# Patient Record
Sex: Female | Born: 1945 | Race: Black or African American | Hispanic: No | Marital: Married | Smoking: Never smoker
Health system: Southern US, Community
[De-identification: ages and names within clinical notes are randomized; demographics above are authoritative.]

## PROBLEM LIST (undated history)

## (undated) DIAGNOSIS — E039 Hypothyroidism, unspecified: Secondary | ICD-10-CM

## (undated) DIAGNOSIS — E1129 Type 2 diabetes mellitus with other diabetic kidney complication: Secondary | ICD-10-CM

## (undated) DIAGNOSIS — E559 Vitamin D deficiency, unspecified: Secondary | ICD-10-CM

## (undated) DIAGNOSIS — M109 Gout, unspecified: Secondary | ICD-10-CM

## (undated) DIAGNOSIS — T7840XA Allergy, unspecified, initial encounter: Secondary | ICD-10-CM

## (undated) DIAGNOSIS — I10 Essential (primary) hypertension: Secondary | ICD-10-CM

## (undated) DIAGNOSIS — F419 Anxiety disorder, unspecified: Secondary | ICD-10-CM

## (undated) DIAGNOSIS — L80 Vitiligo: Secondary | ICD-10-CM

## (undated) DIAGNOSIS — J309 Allergic rhinitis, unspecified: Secondary | ICD-10-CM

## (undated) DIAGNOSIS — G4733 Obstructive sleep apnea (adult) (pediatric): Secondary | ICD-10-CM

## (undated) DIAGNOSIS — E669 Obesity, unspecified: Secondary | ICD-10-CM

## (undated) DIAGNOSIS — E785 Hyperlipidemia, unspecified: Secondary | ICD-10-CM

## (undated) HISTORY — DX: Hyperlipidemia, unspecified: E78.5

## (undated) HISTORY — DX: Allergy, unspecified, initial encounter: T78.40XA

## (undated) HISTORY — DX: Obesity, unspecified: E66.9

## (undated) HISTORY — DX: Vitiligo: L80

## (undated) HISTORY — PX: TUBAL LIGATION: SHX77

## (undated) HISTORY — PX: DENTAL EXAMINATION UNDER ANESTHESIA: SHX1447

## (undated) HISTORY — DX: Vitamin D deficiency, unspecified: E55.9

## (undated) HISTORY — DX: Hypothyroidism, unspecified: E03.9

## (undated) HISTORY — DX: Obstructive sleep apnea (adult) (pediatric): G47.33

## (undated) HISTORY — DX: Gout, unspecified: M10.9

## (undated) HISTORY — DX: Allergic rhinitis, unspecified: J30.9

## (undated) HISTORY — DX: Anxiety disorder, unspecified: F41.9

## (undated) HISTORY — DX: Type 2 diabetes mellitus with other diabetic kidney complication: E11.29

## (undated) HISTORY — PX: OTHER SURGICAL HISTORY: SHX169

## (undated) HISTORY — DX: Essential (primary) hypertension: I10

---

## 1998-09-20 ENCOUNTER — Other Ambulatory Visit: Admission: RE | Admit: 1998-09-20 | Discharge: 1998-09-20 | Payer: Self-pay | Admitting: *Deleted

## 1998-11-14 ENCOUNTER — Ambulatory Visit (HOSPITAL_COMMUNITY): Admission: RE | Admit: 1998-11-14 | Discharge: 1998-11-14 | Payer: Self-pay | Admitting: *Deleted

## 1999-10-10 ENCOUNTER — Other Ambulatory Visit: Admission: RE | Admit: 1999-10-10 | Discharge: 1999-10-10 | Payer: Self-pay | Admitting: *Deleted

## 1999-12-13 ENCOUNTER — Ambulatory Visit (HOSPITAL_COMMUNITY): Admission: RE | Admit: 1999-12-13 | Discharge: 1999-12-13 | Payer: Self-pay | Admitting: *Deleted

## 2000-11-02 ENCOUNTER — Other Ambulatory Visit: Admission: RE | Admit: 2000-11-02 | Discharge: 2000-11-02 | Payer: Self-pay | Admitting: *Deleted

## 2001-01-04 ENCOUNTER — Ambulatory Visit (HOSPITAL_COMMUNITY): Admission: RE | Admit: 2001-01-04 | Discharge: 2001-01-04 | Payer: Self-pay | Admitting: *Deleted

## 2001-11-23 ENCOUNTER — Other Ambulatory Visit: Admission: RE | Admit: 2001-11-23 | Discharge: 2001-11-23 | Payer: Self-pay | Admitting: Internal Medicine

## 2002-01-20 ENCOUNTER — Encounter: Payer: Self-pay | Admitting: Internal Medicine

## 2002-01-20 ENCOUNTER — Ambulatory Visit (HOSPITAL_COMMUNITY): Admission: RE | Admit: 2002-01-20 | Discharge: 2002-01-20 | Payer: Self-pay | Admitting: Internal Medicine

## 2003-01-26 ENCOUNTER — Ambulatory Visit (HOSPITAL_COMMUNITY): Admission: RE | Admit: 2003-01-26 | Discharge: 2003-01-26 | Payer: Self-pay | Admitting: Internal Medicine

## 2003-01-26 ENCOUNTER — Encounter: Payer: Self-pay | Admitting: Internal Medicine

## 2003-03-02 ENCOUNTER — Ambulatory Visit (HOSPITAL_COMMUNITY): Admission: RE | Admit: 2003-03-02 | Discharge: 2003-03-02 | Payer: Self-pay | Admitting: Internal Medicine

## 2003-03-02 ENCOUNTER — Encounter: Payer: Self-pay | Admitting: Internal Medicine

## 2003-03-09 ENCOUNTER — Ambulatory Visit (HOSPITAL_COMMUNITY): Admission: RE | Admit: 2003-03-09 | Discharge: 2003-03-09 | Payer: Self-pay | Admitting: Internal Medicine

## 2003-03-09 ENCOUNTER — Encounter: Payer: Self-pay | Admitting: Internal Medicine

## 2003-04-13 ENCOUNTER — Emergency Department (HOSPITAL_COMMUNITY): Admission: EM | Admit: 2003-04-13 | Discharge: 2003-04-13 | Payer: Self-pay | Admitting: Emergency Medicine

## 2003-06-19 ENCOUNTER — Ambulatory Visit (HOSPITAL_COMMUNITY): Admission: RE | Admit: 2003-06-19 | Discharge: 2003-06-19 | Payer: Self-pay | Admitting: Internal Medicine

## 2003-06-19 ENCOUNTER — Encounter: Payer: Self-pay | Admitting: Internal Medicine

## 2003-11-30 ENCOUNTER — Other Ambulatory Visit: Admission: RE | Admit: 2003-11-30 | Discharge: 2003-11-30 | Payer: Self-pay | Admitting: Internal Medicine

## 2004-12-10 ENCOUNTER — Ambulatory Visit (HOSPITAL_COMMUNITY): Admission: RE | Admit: 2004-12-10 | Discharge: 2004-12-10 | Payer: Self-pay | Admitting: Internal Medicine

## 2005-12-31 ENCOUNTER — Ambulatory Visit (HOSPITAL_COMMUNITY): Admission: RE | Admit: 2005-12-31 | Discharge: 2005-12-31 | Payer: Self-pay | Admitting: Internal Medicine

## 2005-12-31 ENCOUNTER — Other Ambulatory Visit: Admission: RE | Admit: 2005-12-31 | Discharge: 2005-12-31 | Payer: Self-pay | Admitting: Internal Medicine

## 2006-10-12 ENCOUNTER — Encounter: Admission: RE | Admit: 2006-10-12 | Discharge: 2007-01-10 | Payer: Self-pay | Admitting: Internal Medicine

## 2006-11-18 ENCOUNTER — Emergency Department (HOSPITAL_COMMUNITY): Admission: EM | Admit: 2006-11-18 | Discharge: 2006-11-18 | Payer: Self-pay | Admitting: Family Medicine

## 2007-01-28 ENCOUNTER — Ambulatory Visit (HOSPITAL_COMMUNITY): Admission: RE | Admit: 2007-01-28 | Discharge: 2007-01-28 | Payer: Self-pay | Admitting: Obstetrics and Gynecology

## 2008-01-28 ENCOUNTER — Other Ambulatory Visit: Admission: RE | Admit: 2008-01-28 | Discharge: 2008-01-28 | Payer: Self-pay | Admitting: Internal Medicine

## 2008-02-09 ENCOUNTER — Ambulatory Visit (HOSPITAL_COMMUNITY): Admission: RE | Admit: 2008-02-09 | Discharge: 2008-02-09 | Payer: Self-pay | Admitting: Internal Medicine

## 2008-10-09 ENCOUNTER — Emergency Department (HOSPITAL_COMMUNITY): Admission: EM | Admit: 2008-10-09 | Discharge: 2008-10-09 | Payer: Self-pay | Admitting: Emergency Medicine

## 2008-10-11 ENCOUNTER — Ambulatory Visit (HOSPITAL_COMMUNITY): Admission: RE | Admit: 2008-10-11 | Discharge: 2008-10-11 | Payer: Self-pay | Admitting: Chiropractor

## 2009-03-14 ENCOUNTER — Ambulatory Visit (HOSPITAL_COMMUNITY): Admission: RE | Admit: 2009-03-14 | Discharge: 2009-03-14 | Payer: Self-pay | Admitting: Internal Medicine

## 2010-06-03 ENCOUNTER — Ambulatory Visit (HOSPITAL_COMMUNITY): Admission: RE | Admit: 2010-06-03 | Discharge: 2010-06-03 | Payer: Self-pay | Admitting: Internal Medicine

## 2011-01-07 ENCOUNTER — Encounter: Payer: Self-pay | Admitting: Gastroenterology

## 2011-01-12 ENCOUNTER — Encounter: Payer: Self-pay | Admitting: Internal Medicine

## 2011-01-23 NOTE — Letter (Signed)
Summary: Colonoscopy Date Change Letter  [facility]  [place], [place] [address]   Phone: [phone]  Fax: [phone]      January 07, 2011 MRN: [id number]   Jocelyn Camacho [address] [place], [place]  [address]   Dear [name],   Previously you were recommended to have a repeat colonoscopy around this time. Your chart was recently reviewed by DR.KAPLAN of [facility]. Follow up colonoscopy is now recommended in JAN 2015. This revised recommendation is based on current, nationally recognized guidelines for colorectal cancer screening and polyp surveillance. These guidelines are endorsed by the [place] Task Force on Colorectal Cancer as well as numerous other major medical organizations.  Please understand that our recommendation assumes that you do not have any new symptoms such as bleeding, a change in bowel habits, anemia, or significant abdominal discomfort. If you do have any concerning GI symptoms or want to discuss the guideline recommendations, please call to arrange an office visit at your earliest convenience. Otherwise we will keep you in our reminder system and contact you 1-2 months prior to the date listed above to schedule your next colonoscopy.  Thank you,  Sandy Salaam. Deatra Ina, M.D. [facility] [phone]

## 2011-07-02 ENCOUNTER — Other Ambulatory Visit (HOSPITAL_COMMUNITY): Payer: Self-pay | Admitting: Internal Medicine

## 2011-07-02 ENCOUNTER — Other Ambulatory Visit (HOSPITAL_COMMUNITY)
Admission: RE | Admit: 2011-07-02 | Discharge: 2011-07-02 | Disposition: A | Discharge status: Home / Self Care | Payer: Medicare Other | Source: Ambulatory Visit | Attending: Internal Medicine | Admitting: Internal Medicine

## 2011-07-02 DIAGNOSIS — Z01419 Encounter for gynecological examination (general) (routine) without abnormal findings: Secondary | ICD-10-CM | POA: Insufficient documentation

## 2011-07-02 DIAGNOSIS — Z1231 Encounter for screening mammogram for malignant neoplasm of breast: Secondary | ICD-10-CM

## 2011-07-11 ENCOUNTER — Ambulatory Visit (HOSPITAL_COMMUNITY)
Admission: RE | Admit: 2011-07-11 | Discharge: 2011-07-11 | Disposition: A | Discharge status: Home / Self Care | Payer: Medicare Other | Source: Ambulatory Visit | Attending: Internal Medicine | Admitting: Internal Medicine

## 2011-07-11 DIAGNOSIS — Z1231 Encounter for screening mammogram for malignant neoplasm of breast: Secondary | ICD-10-CM | POA: Insufficient documentation

## 2012-07-12 ENCOUNTER — Other Ambulatory Visit: Payer: Self-pay | Admitting: Internal Medicine

## 2012-07-12 DIAGNOSIS — I10 Essential (primary) hypertension: Secondary | ICD-10-CM

## 2012-07-14 ENCOUNTER — Other Ambulatory Visit (HOSPITAL_COMMUNITY): Payer: Self-pay | Admitting: Internal Medicine

## 2012-07-14 DIAGNOSIS — Z1231 Encounter for screening mammogram for malignant neoplasm of breast: Secondary | ICD-10-CM

## 2012-07-15 ENCOUNTER — Ambulatory Visit
Admission: RE | Admit: 2012-07-15 | Discharge: 2012-07-15 | Disposition: A | Discharge status: Home / Self Care | Payer: Medicare Other | Source: Ambulatory Visit | Attending: Internal Medicine | Admitting: Internal Medicine

## 2012-07-15 DIAGNOSIS — I10 Essential (primary) hypertension: Secondary | ICD-10-CM

## 2012-08-03 ENCOUNTER — Ambulatory Visit (HOSPITAL_COMMUNITY)
Admission: RE | Admit: 2012-08-03 | Discharge: 2012-08-03 | Disposition: A | Discharge status: Home / Self Care | Payer: 59 | Source: Ambulatory Visit | Attending: Internal Medicine | Admitting: Internal Medicine

## 2012-08-03 DIAGNOSIS — Z1231 Encounter for screening mammogram for malignant neoplasm of breast: Secondary | ICD-10-CM | POA: Insufficient documentation

## 2012-08-18 ENCOUNTER — Encounter: Payer: Medicare Other | Attending: Internal Medicine | Admitting: *Deleted

## 2012-08-18 ENCOUNTER — Encounter: Payer: Self-pay | Admitting: *Deleted

## 2012-08-18 DIAGNOSIS — I10 Essential (primary) hypertension: Secondary | ICD-10-CM | POA: Insufficient documentation

## 2012-08-18 DIAGNOSIS — Z713 Dietary counseling and surveillance: Secondary | ICD-10-CM | POA: Insufficient documentation

## 2012-08-18 DIAGNOSIS — E669 Obesity, unspecified: Secondary | ICD-10-CM | POA: Insufficient documentation

## 2012-08-18 NOTE — Progress Notes (Signed)
  Medical Nutrition Therapy:  Appt start time: 0900 end time:  1000.  Assessment:  Primary concerns today: patient here for hypertension and obesity. She retired in 2004 from [employer] and she lives with her husband. She states she has recently been diagnosed with diabetes, she is testing her BG each AM and range is 90-106. She was having symptoms including polydipsia and polyuria but they have subsided since she started on diabetes medications. Has lost weight when on Weight Watchers and with [employer] at [place]. She states life long history of obesity with weight swings of 70 pounds at times.  MEDICATIONS: see list   DIETARY INTAKE:  Usual eating pattern includes 3 meals and 2-3 snacks per day.  Everyday foods include good variety of all food groups.  Avoided foods include biscuits, sweets and most fried foods.    24-hr recall:  B ( AM):flavored instant oatmeal x 2, smoked sausage, water OR sausage McMuffin sandwich OR [employer] bkfst sandwich on [place] toast with egg white and cheese on flat bread OR grits, meat, fruit Snk ( AM): fresh fruit  L ( PM): sandwich OR Wendy's chili and side salad with no dressing, diet soda Snk ( PM): occasionally fresh fruit OR chili OR stew D ( PM): eat out often or pick up food: meat and 2 vegetables, occasionally a starch Snk ( PM): fresh fruit or chips Beverages: water, diet soda  Usual physical activity: history of boot camp, walking jogging, aerobic exercises   Estimated energy needs: 1400 calories 158 g carbohydrates 105 g protein 39 g fat  Progress Towards Goal(s):  In progress.   Nutritional Diagnosis:  NI-1.5 Excessive energy intake As related to activity level.  As evidenced by BMI of 39.5.    Intervention:  Nutrition counseling provided for weight loss. Discussed caloric content of macro-nutrients and rationale of using Carb Counting to increase awareness of current eating habits and value of preferred foods. Also  discussed higher caloric content of fats and recommendation of fat content per meal. Finally discussed her preferred forms of activity and ways to continue to include those on a daily basis. Plan: Aim for 2-3 Carb choices (30-45 grams) per meal, Aim for 0-1 carb per snack if hungry Continue with activity level as tolerated Continue eating 3 meals a day and snacks if hungry Consider 7 Minute Workout APP or perhaps water exercises or Spinning Class  Consider Calorie King APP to provide you with more nutrition info on foods  Handouts given during visit include: Carb Counting and Food Label handouts Meal Plan Card  Diabetes Medication Sheet   Monitoring/Evaluation:  Dietary intake, exercise, reading food labels, and body weight in 5 week(s).

## 2012-08-18 NOTE — Patient Instructions (Addendum)
Plan: Aim for 2-3 Carb choices (30-45 grams) per meal, Aim for 0-1 carb per snack if hungry Continue with activity level as tolerated Continue eating 3 meals a day and snacks if hungry Consider 7 Minute Workout APP or perhaps water exercises Consider Calorie Edison Pace APP to provide you with more nutrition info on foods

## 2012-09-29 ENCOUNTER — Ambulatory Visit: Payer: Medicare Other | Admitting: *Deleted

## 2012-10-13 ENCOUNTER — Encounter: Payer: Medicare Other | Attending: Internal Medicine | Admitting: *Deleted

## 2012-10-13 DIAGNOSIS — Z713 Dietary counseling and surveillance: Secondary | ICD-10-CM | POA: Insufficient documentation

## 2012-10-13 DIAGNOSIS — I10 Essential (primary) hypertension: Secondary | ICD-10-CM | POA: Insufficient documentation

## 2012-10-13 DIAGNOSIS — E669 Obesity, unspecified: Secondary | ICD-10-CM | POA: Insufficient documentation

## 2012-10-13 NOTE — Patient Instructions (Addendum)
Plan: Continue to aim for 3 Carb choices (45 grams) per meal Continue to aim for 0-1 carb per snack if hungry Consider aiming for 15 grams of fat at each meal or less Continue with activity level as tolerated Continue eating 3 meals a day and snacks if hungry Consider 7 Minute Workout APP or perhaps water exercises Consider Calorie Edison Pace APP to provide you with more nutrition info on foods Check into My Fitness Pal APP to track food and exercise easily

## 2012-10-13 NOTE — Progress Notes (Signed)
  Medical Nutrition Therapy:  Appt start time: 0830 end time:  0900.  Assessment:  Primary concerns today: patient here for hypertension and obesity follow up visit. Weight gain of 13 pounds in last 2 months noted. She has had flu last several weeks but when able she is getting a brisk walk in when going to football games and when shopping. She states she is SMBG and her BGs are within target ranges.   MEDICATIONS: see list   DIETARY INTAKE:  Usual eating pattern includes 3 meals and 2-3 snacks per day.  Everyday foods include good variety of all food groups.  Avoided foods include biscuits, sweets and most fried foods.    24-hr recall:  B ( AM) now 1 flavored and 1 plain instant oatmeal mixed, smoked sausage, water OR sausage McMuffin sandwich OR grits, meat, fruit Snk ( AM): fresh fruit if available L ( PM): sandwich OR Wendy's chili and side salad with no dressing, OF Burger with veggies and a couple of fries @ [employer], diet soda Snk ( PM): occasionally fresh fruit OR chili OR stew D ( PM): eat out often or pick up food: meat and 2 vegetables, occasionally a starch Snk ( PM): fresh fruit or chips Beverages: water, diet soda  Usual physical activity: history of boot camp, walking jogging, aerobic exercises. Has had the flu so no exercise for last several weeks except for some Arm Chair exercises and walking in place at home for about 30 minutes.  Estimated energy needs: 1400 calories 158 g carbohydrates 105 g protein 39 g fat  Progress Towards Goal(s):  In progress.   Nutritional Diagnosis:  NI-1.5 Excessive energy intake As related to activity level.  As evidenced by current BMI increase to 41.8.    Intervention:  Reviewed importance of Carb Counting and increasing her activity level to stop weight gain. Commended her on her BG control and recommend she test her BG at alternate times of day to capture more data pre and post meals. Discussed calorie content of fat and  recommend she aim for no more than 3 fat servings (15 grams) per meal, which will also assist with weight management.  Plan: Continue to aim for 3 Carb choices (45 grams) per meal Continue to aim for 0-1 carb per snack if hungry Consider aiming for 15 grams of fat at each meal or less Continue with activity level as tolerated Continue eating 3 meals a day and snacks if hungry Consider 7 Minute Workout APP or perhaps water exercises Consider Calorie Edison Pace APP to provide you with more nutrition info on foods Check into My Fitness Pal APP to track food and exercise easily  Monitoring/Evaluation:  Dietary intake, exercise, reading food labels, and body weight in 2 months.

## 2012-10-21 ENCOUNTER — Encounter: Payer: Self-pay | Admitting: *Deleted

## 2012-12-13 ENCOUNTER — Ambulatory Visit: Payer: Medicare Other | Admitting: *Deleted

## 2013-06-10 ENCOUNTER — Encounter (INDEPENDENT_AMBULATORY_CARE_PROVIDER_SITE_OTHER): Payer: Medicare Other | Admitting: Ophthalmology

## 2013-06-10 DIAGNOSIS — I10 Essential (primary) hypertension: Secondary | ICD-10-CM

## 2013-06-10 DIAGNOSIS — E1165 Type 2 diabetes mellitus with hyperglycemia: Secondary | ICD-10-CM

## 2013-06-10 DIAGNOSIS — E11319 Type 2 diabetes mellitus with unspecified diabetic retinopathy without macular edema: Secondary | ICD-10-CM

## 2013-06-10 DIAGNOSIS — H348192 Central retinal vein occlusion, unspecified eye, stable: Secondary | ICD-10-CM

## 2013-06-10 DIAGNOSIS — H35039 Hypertensive retinopathy, unspecified eye: Secondary | ICD-10-CM

## 2013-07-08 ENCOUNTER — Encounter (INDEPENDENT_AMBULATORY_CARE_PROVIDER_SITE_OTHER): Payer: Medicare Other | Admitting: Ophthalmology

## 2013-07-08 DIAGNOSIS — E11319 Type 2 diabetes mellitus with unspecified diabetic retinopathy without macular edema: Secondary | ICD-10-CM

## 2013-07-08 DIAGNOSIS — I10 Essential (primary) hypertension: Secondary | ICD-10-CM

## 2013-07-08 DIAGNOSIS — E1139 Type 2 diabetes mellitus with other diabetic ophthalmic complication: Secondary | ICD-10-CM

## 2013-07-08 DIAGNOSIS — H43819 Vitreous degeneration, unspecified eye: Secondary | ICD-10-CM

## 2013-07-08 DIAGNOSIS — H35039 Hypertensive retinopathy, unspecified eye: Secondary | ICD-10-CM

## 2013-07-08 DIAGNOSIS — H348192 Central retinal vein occlusion, unspecified eye, stable: Secondary | ICD-10-CM

## 2013-08-05 ENCOUNTER — Encounter (INDEPENDENT_AMBULATORY_CARE_PROVIDER_SITE_OTHER): Payer: Medicare Other | Admitting: Ophthalmology

## 2013-08-05 DIAGNOSIS — H348192 Central retinal vein occlusion, unspecified eye, stable: Secondary | ICD-10-CM

## 2013-08-05 DIAGNOSIS — E1165 Type 2 diabetes mellitus with hyperglycemia: Secondary | ICD-10-CM

## 2013-08-05 DIAGNOSIS — H35039 Hypertensive retinopathy, unspecified eye: Secondary | ICD-10-CM

## 2013-08-05 DIAGNOSIS — I10 Essential (primary) hypertension: Secondary | ICD-10-CM

## 2013-08-05 DIAGNOSIS — E11319 Type 2 diabetes mellitus with unspecified diabetic retinopathy without macular edema: Secondary | ICD-10-CM

## 2013-08-05 DIAGNOSIS — H2510 Age-related nuclear cataract, unspecified eye: Secondary | ICD-10-CM

## 2013-08-05 DIAGNOSIS — H43819 Vitreous degeneration, unspecified eye: Secondary | ICD-10-CM

## 2013-08-29 ENCOUNTER — Encounter (HOSPITAL_COMMUNITY): Payer: Self-pay | Admitting: *Deleted

## 2013-08-29 ENCOUNTER — Emergency Department (HOSPITAL_COMMUNITY)
Admission: EM | Admit: 2013-08-29 | Discharge: 2013-08-29 | Disposition: A | Discharge status: Home / Self Care | Payer: Medicare Other | Attending: Emergency Medicine | Admitting: Emergency Medicine

## 2013-08-29 DIAGNOSIS — E079 Disorder of thyroid, unspecified: Secondary | ICD-10-CM | POA: Insufficient documentation

## 2013-08-29 DIAGNOSIS — Y929 Unspecified place or not applicable: Secondary | ICD-10-CM | POA: Insufficient documentation

## 2013-08-29 DIAGNOSIS — L0390 Cellulitis, unspecified: Secondary | ICD-10-CM

## 2013-08-29 DIAGNOSIS — I10 Essential (primary) hypertension: Secondary | ICD-10-CM | POA: Insufficient documentation

## 2013-08-29 DIAGNOSIS — Z880 Allergy status to penicillin: Secondary | ICD-10-CM | POA: Insufficient documentation

## 2013-08-29 DIAGNOSIS — E119 Type 2 diabetes mellitus without complications: Secondary | ICD-10-CM | POA: Insufficient documentation

## 2013-08-29 DIAGNOSIS — S81009A Unspecified open wound, unspecified knee, initial encounter: Secondary | ICD-10-CM | POA: Insufficient documentation

## 2013-08-29 DIAGNOSIS — L02419 Cutaneous abscess of limb, unspecified: Secondary | ICD-10-CM | POA: Insufficient documentation

## 2013-08-29 DIAGNOSIS — W268XXA Contact with other sharp object(s), not elsewhere classified, initial encounter: Secondary | ICD-10-CM | POA: Insufficient documentation

## 2013-08-29 DIAGNOSIS — Y9389 Activity, other specified: Secondary | ICD-10-CM | POA: Insufficient documentation

## 2013-08-29 DIAGNOSIS — Z79899 Other long term (current) drug therapy: Secondary | ICD-10-CM | POA: Insufficient documentation

## 2013-08-29 NOTE — ED Provider Notes (Signed)
CSN: [id number]     Arrival date & time 08/29/13  [id number] History   First MD Initiated Contact with Patient 08/29/13 [phone]     Chief Complaint  Patient presents with  . Leg infection     Right    HPI  She was seen by her physician on Friday. She scratch her leg on a piece of furniture indoors on Tuesday. She scratched "some of the skin off". By Friday was read. She had a routine appointment with her physician. She was placed on doxycycline. She admits today that she wasn't sure if her physician who told her to come today no matter what, or only if she was having difficulties. She states she feels like it looks and feels better but."had better get it checked".  No fevers. No pain up the leg. No red streaks.  Her last blood sugar was yesterday and was 88. Past Medical History  Diagnosis Date  . Hypertension   . Diabetes mellitus   . Thyroid disease    Past Surgical History  Procedure Laterality Date  . Abdominal hysterectomy     History reviewed. No pertinent family history. History  Substance Use Topics  . Smoking status: Never Smoker   . Smokeless tobacco: Never Used  . Alcohol Use: No   OB History   Grav Para Term Preterm Abortions TAB SAB Ect Mult Living                 Review of Systems  Constitutional: Negative for fever and fatigue.  Respiratory: Negative for shortness of breath.   Cardiovascular: Negative for chest pain.  Gastrointestinal: Negative for abdominal pain.  Musculoskeletal: Negative for joint swelling and arthralgias.  Skin: Positive for rash and wound.    Allergies  Penicillins  Home Medications   Current Outpatient Rx  Name  Route  Sig  Dispense  Refill  . atenolol (TENORMIN) 100 MG tablet   Oral   Take 50 mg by mouth daily.         Marland Kitchen b complex vitamins tablet   Oral   Take 1 tablet by mouth daily.         . calcium carbonate (OS-CAL) 600 MG TABS   Oral   Take 600 mg by mouth 2 (two) times daily with a meal.         . Cholecalciferol  (VITAMIN D3) 2000 UNITS TABS   Oral   Take 2,000 mcg by mouth 3 (three) times daily.         . Flaxseed, Linseed, (FLAXSEED OIL PO)   Oral   Take by mouth 2 (two) times daily.         . furosemide (LASIX) 40 MG tablet   Oral   Take 40 mg by mouth daily.         Marland Kitchen glyBURIDE (DIABETA) 5 MG tablet   Oral   Take 2.5 mg by mouth 3 (three) times daily.         Marland Kitchen levothyroxine (SYNTHROID, LEVOTHROID) 200 MCG tablet   Oral   Take 200 mcg by mouth daily.         Marland Kitchen losartan (COZAAR) 100 MG tablet   Oral   Take 100 mg by mouth daily.         . metFORMIN (GLUCOPHAGE) 500 MG tablet   Oral   Take 1,000 mg by mouth 2 (two) times daily with a meal.         . Multiple Vitamin (MULTIVITAMIN) tablet  Oral   Take 1 tablet by mouth daily.         . naproxen (NAPROSYN) 250 MG tablet   Oral   Take 250 mg by mouth as needed.         Marland Kitchen olmesartan (BENICAR) 40 MG tablet   Oral   Take 40 mg by mouth daily.         . potassium chloride (K-DUR) 10 MEQ tablet   Oral   Take 10 mEq by mouth daily.          BP 118/77  Pulse 73  Temp(Src) 98.3 F (36.8 C) (Oral)  Resp 16  Ht 5\' 3"  (1.6 m)  Wt 233 lb (105.688 kg)  BMI 41.28 kg/m2  SpO2 95% Physical Exam  Constitutional:  Awake alert, she does not appear toxic  Cardiovascular:  Regular rhythm not tachycardic  Pulmonary/Chest:  Clear lungs  Abdominal:  Abdomen soft benign  Musculoskeletal:  No increase in circumference of the right lower leg versus the left  Skin:  She has a wound to the right lower leg. Approximately 8 x 10 cm. There is a ruptured bulla. She has good pink granulation tissue underlying this. There is minimal surrounding erythema. There is no proximal spread or striations.    ED Course  Procedures (including critical care time) Labs Review Labs Reviewed - No data to display Imaging Review No results found.  MDM   1. Cellulitis    who was cleaned with soap and water. The stridor then  redressed with a nonadherent Vaseline gauze and a Tegaderm. Hopefully additional therapeutics and diagnostics indicated at this time she is doing well on the doxycycline and given her by her primary care physician. At any point this looks or feels worse in bastard recheck with primary care or here. Diagnosis is wound cellulitis-healing    Lolita Patella, MD 08/29/13 [phone]

## 2013-08-29 NOTE — ED Notes (Signed)
Pt states about a week ago she scrapped her lower R leg on furniture, went to PCP this past Friday for physical, showed MD her leg and was placed on an antibiotic, was told to come here Monday to see if she needed IV antibiotics.

## 2013-09-01 ENCOUNTER — Encounter (INDEPENDENT_AMBULATORY_CARE_PROVIDER_SITE_OTHER): Payer: Medicare Other | Admitting: Ophthalmology

## 2013-09-01 DIAGNOSIS — I10 Essential (primary) hypertension: Secondary | ICD-10-CM

## 2013-09-01 DIAGNOSIS — E1139 Type 2 diabetes mellitus with other diabetic ophthalmic complication: Secondary | ICD-10-CM

## 2013-09-01 DIAGNOSIS — H348192 Central retinal vein occlusion, unspecified eye, stable: Secondary | ICD-10-CM

## 2013-09-01 DIAGNOSIS — H35039 Hypertensive retinopathy, unspecified eye: Secondary | ICD-10-CM

## 2013-09-01 DIAGNOSIS — H43819 Vitreous degeneration, unspecified eye: Secondary | ICD-10-CM

## 2013-09-01 DIAGNOSIS — E11319 Type 2 diabetes mellitus with unspecified diabetic retinopathy without macular edema: Secondary | ICD-10-CM

## 2013-09-01 DIAGNOSIS — H2510 Age-related nuclear cataract, unspecified eye: Secondary | ICD-10-CM

## 2013-09-13 ENCOUNTER — Other Ambulatory Visit (HOSPITAL_COMMUNITY): Payer: Self-pay | Admitting: Internal Medicine

## 2013-09-13 DIAGNOSIS — Z1231 Encounter for screening mammogram for malignant neoplasm of breast: Secondary | ICD-10-CM

## 2013-09-21 ENCOUNTER — Ambulatory Visit (HOSPITAL_COMMUNITY): Payer: Medicare Other

## 2013-09-30 ENCOUNTER — Encounter (INDEPENDENT_AMBULATORY_CARE_PROVIDER_SITE_OTHER): Payer: Medicare Other | Admitting: Ophthalmology

## 2013-09-30 DIAGNOSIS — I10 Essential (primary) hypertension: Secondary | ICD-10-CM

## 2013-09-30 DIAGNOSIS — H348192 Central retinal vein occlusion, unspecified eye, stable: Secondary | ICD-10-CM

## 2013-09-30 DIAGNOSIS — H2510 Age-related nuclear cataract, unspecified eye: Secondary | ICD-10-CM

## 2013-09-30 DIAGNOSIS — H35039 Hypertensive retinopathy, unspecified eye: Secondary | ICD-10-CM

## 2013-09-30 DIAGNOSIS — H43819 Vitreous degeneration, unspecified eye: Secondary | ICD-10-CM

## 2013-10-25 ENCOUNTER — Ambulatory Visit (HOSPITAL_COMMUNITY)
Admission: RE | Admit: 2013-10-25 | Discharge: 2013-10-25 | Disposition: A | Discharge status: Home / Self Care | Payer: Medicare Other | Source: Ambulatory Visit | Attending: Internal Medicine | Admitting: Internal Medicine

## 2013-10-25 DIAGNOSIS — Z1231 Encounter for screening mammogram for malignant neoplasm of breast: Secondary | ICD-10-CM | POA: Insufficient documentation

## 2013-11-07 ENCOUNTER — Encounter: Payer: Self-pay | Admitting: Gastroenterology

## 2013-11-11 ENCOUNTER — Encounter (INDEPENDENT_AMBULATORY_CARE_PROVIDER_SITE_OTHER): Payer: Medicare Other | Admitting: Ophthalmology

## 2013-11-11 DIAGNOSIS — I10 Essential (primary) hypertension: Secondary | ICD-10-CM

## 2013-11-11 DIAGNOSIS — H348192 Central retinal vein occlusion, unspecified eye, stable: Secondary | ICD-10-CM

## 2013-11-11 DIAGNOSIS — H35039 Hypertensive retinopathy, unspecified eye: Secondary | ICD-10-CM

## 2013-11-11 DIAGNOSIS — H43819 Vitreous degeneration, unspecified eye: Secondary | ICD-10-CM

## 2013-11-11 DIAGNOSIS — H2510 Age-related nuclear cataract, unspecified eye: Secondary | ICD-10-CM

## 2013-11-14 ENCOUNTER — Other Ambulatory Visit: Payer: Self-pay | Admitting: Internal Medicine

## 2013-11-14 MED ORDER — POTASSIUM CHLORIDE ER 10 MEQ PO TBCR: 10.0000 meq | EXTENDED_RELEASE_TABLET | Freq: Every day | ORAL | Status: DC

## 2013-11-15 ENCOUNTER — Encounter: Payer: Self-pay | Admitting: Internal Medicine

## 2013-11-16 ENCOUNTER — Ambulatory Visit: Payer: Medicare Other | Admitting: Internal Medicine

## 2013-11-16 ENCOUNTER — Telehealth: Payer: Self-pay | Admitting: Internal Medicine

## 2013-11-16 ENCOUNTER — Encounter: Payer: Self-pay | Admitting: Internal Medicine

## 2013-11-16 VITALS — BP 130/82 | HR 66 | Temp 97.9°F | Resp 16 | Wt 241.8 lb

## 2013-11-16 DIAGNOSIS — I10 Essential (primary) hypertension: Secondary | ICD-10-CM

## 2013-11-16 DIAGNOSIS — G4733 Obstructive sleep apnea (adult) (pediatric): Secondary | ICD-10-CM | POA: Insufficient documentation

## 2013-11-16 DIAGNOSIS — E1122 Type 2 diabetes mellitus with diabetic chronic kidney disease: Secondary | ICD-10-CM | POA: Insufficient documentation

## 2013-11-16 DIAGNOSIS — E119 Type 2 diabetes mellitus without complications: Secondary | ICD-10-CM

## 2013-11-16 DIAGNOSIS — E559 Vitamin D deficiency, unspecified: Secondary | ICD-10-CM | POA: Insufficient documentation

## 2013-11-16 DIAGNOSIS — Z794 Long term (current) use of insulin: Secondary | ICD-10-CM | POA: Insufficient documentation

## 2013-11-16 DIAGNOSIS — Z79899 Other long term (current) drug therapy: Secondary | ICD-10-CM

## 2013-11-16 DIAGNOSIS — E039 Hypothyroidism, unspecified: Secondary | ICD-10-CM

## 2013-11-16 DIAGNOSIS — E782 Mixed hyperlipidemia: Secondary | ICD-10-CM

## 2013-11-16 LAB — CBC WITH DIFFERENTIAL/PLATELET
Eosinophils Relative: 2 % (ref 0–5)
HCT: 35.5 % — ABNORMAL LOW (ref 36.0–46.0)
Hemoglobin: 12.2 g/dL (ref 12.0–15.0)
Lymphocytes Relative: 32 % (ref 12–46)
Lymphs Abs: 2.7 10*3/uL (ref 0.7–4.0)
MCH: 27 pg (ref 26.0–34.0)
MCV: 78.5 fL (ref 78.0–100.0)
Monocytes Absolute: 0.5 10*3/uL (ref 0.1–1.0)
Monocytes Relative: 6 % (ref 3–12)
Neutro Abs: 5.1 10*3/uL (ref 1.7–7.7)
RBC: 4.52 MIL/uL (ref 3.87–5.11)
RDW: 14.7 % (ref 11.5–15.5)
WBC: 8.4 10*3/uL (ref 4.0–10.5)

## 2013-11-16 LAB — BASIC METABOLIC PANEL WITH GFR
BUN: 16 mg/dL (ref 6–23)
CO2: 29 mEq/L (ref 19–32)
Chloride: 103 mEq/L (ref 96–112)
Creat: 0.79 mg/dL (ref 0.50–1.10)
GFR, Est Non African American: 78 mL/min
Glucose, Bld: 98 mg/dL (ref 70–99)
Potassium: 4.3 mEq/L (ref 3.5–5.3)

## 2013-11-16 LAB — HEPATIC FUNCTION PANEL
AST: 18 U/L (ref 0–37)
Albumin: 4.4 g/dL (ref 3.5–5.2)
Alkaline Phosphatase: 81 U/L (ref 39–117)
Bilirubin, Direct: 0.2 mg/dL (ref 0.0–0.3)
Indirect Bilirubin: 0.4 mg/dL (ref 0.0–0.9)
Total Protein: 7.5 g/dL (ref 6.0–8.3)

## 2013-11-16 LAB — TSH: TSH: 0.068 u[IU]/mL — ABNORMAL LOW (ref 0.350–4.500)

## 2013-11-16 LAB — MAGNESIUM: Magnesium: 1.9 mg/dL (ref 1.5–2.5)

## 2013-11-16 LAB — LIPID PANEL
LDL Cholesterol: 73 mg/dL (ref 0–99)
Triglycerides: 125 mg/dL (ref <150)
VLDL: 25 mg/dL (ref 0–40)

## 2013-11-16 LAB — HEMOGLOBIN A1C: Mean Plasma Glucose: 123 mg/dL — ABNORMAL HIGH (ref <117)

## 2013-11-16 MED ORDER — GLIPIZIDE 5 MG PO TABS: ORAL_TABLET | ORAL | Status: DC

## 2013-11-16 MED ORDER — LEVOTHYROXINE SODIUM 200 MCG PO TABS: ORAL_TABLET | ORAL | Status: DC

## 2013-11-16 NOTE — Patient Instructions (Signed)
Continue diet & medications same as discussed.   Further disposition pending lab results.      Hypertension As your heart beats, it forces blood through your arteries. This force is your blood pressure. If the pressure is too high, it is called hypertension (HTN) or high blood pressure. HTN is dangerous because you may have it and not know it. High blood pressure may mean that your heart has to work harder to pump blood. Your arteries may be narrow or stiff. The extra work puts you at risk for heart disease, stroke, and other problems.  Blood pressure consists of two numbers, a higher number over a lower, 110/72, for example. It is stated as "110 over 72." The ideal is below 120 for the top number (systolic) and under 80 for the bottom (diastolic). Write down your blood pressure today. You should pay close attention to your blood pressure if you have certain conditions such as:  Heart failure.  Prior heart attack.  Diabetes  Chronic kidney disease.  Prior stroke.  Multiple risk factors for heart disease. To see if you have HTN, your blood pressure should be measured while you are seated with your arm held at the level of the heart. It should be measured at least twice. A one-time elevated blood pressure reading (especially in the Emergency Department) does not mean that you need treatment. There may be conditions in which the blood pressure is different between your right and left arms. It is important to see your caregiver soon for a recheck. Most people have essential hypertension which means that there is not a specific cause. This type of high blood pressure may be lowered by changing lifestyle factors such as:  Stress.  Smoking.  Lack of exercise.  Excessive weight.  Drug/tobacco/alcohol use.  Eating less salt. Most people do not have symptoms from high blood pressure until it has caused damage to the body. Effective treatment can often prevent, delay or reduce that  damage. TREATMENT  When a cause has been identified, treatment for high blood pressure is directed at the cause. There are a large number of medications to treat HTN. These fall into several categories, and your caregiver will help you select the medicines that are best for you. Medications may have side effects. You should review side effects with your caregiver. If your blood pressure stays high after you have made lifestyle changes or started on medicines,   Your medication(s) may need to be changed.  Other problems may need to be addressed.  Be certain you understand your prescriptions, and know how and when to take your medicine.  Be sure to follow up with your caregiver within the time frame advised (usually within two weeks) to have your blood pressure rechecked and to review your medications.  If you are taking more than one medicine to lower your blood pressure, make sure you know how and at what times they should be taken. Taking two medicines at the same time can result in blood pressure that is too low. SEEK IMMEDIATE MEDICAL CARE IF:  You develop a severe headache, blurred or changing vision, or confusion.  You have unusual weakness or numbness, or a faint feeling.  You have severe chest or abdominal pain, vomiting, or breathing problems. MAKE SURE YOU:   Understand these instructions.  Will watch your condition.  Will get help right away if you are not doing well or get worse. Document Released: 12/08/2005 Document Revised: 03/01/2012 Document Reviewed: 07/28/2008 Exit[employer] Patient  Information 2014 [place], [place]. Cholesterol Cholesterol is a white, waxy, fat-like protein needed by your body in small amounts. The liver makes all the cholesterol you need. It is carried from the liver by the blood through the blood vessels. Deposits (plaque) may build up on blood vessel walls. This makes the arteries narrower and stiffer. Plaque increases the risk for heart attack and  stroke. You cannot feel your cholesterol level even if it is very high. The only way to know is by a blood test to check your lipid (fats) levels. Once you know your cholesterol levels, you should keep a record of the test results. Work with your caregiver to to keep your levels in the desired range. WHAT THE RESULTS MEAN:  Total cholesterol is a rough measure of all the cholesterol in your blood.  LDL is the so-called bad cholesterol. This is the type that deposits cholesterol in the walls of the arteries. You want this level to be low.  HDL is the good cholesterol because it cleans the arteries and carries the LDL away. You want this level to be high.  Triglycerides are fat that the body can either burn for energy or store. High levels are closely linked to heart disease. DESIRED LEVELS:  Total cholesterol below 200.  LDL below 100 for people at risk, below 70 for very high risk.  HDL above 50 is good, above 60 is best.  Triglycerides below 150. HOW TO LOWER YOUR CHOLESTEROL:  Diet.  Choose fish or white meat chicken and [place], roasted or baked. Limit fatty cuts of red meat, fried foods, and processed meats, such as sausage and lunch meat.  Eat lots of fresh fruits and vegetables. Choose whole grains, beans, pasta, potatoes and cereals.  Use only small amounts of olive, corn or canola oils. Avoid butter, mayonnaise, shortening or palm kernel oils. Avoid foods with trans-fats.  Use skim/nonfat milk and low-fat/nonfat yogurt and cheeses. Avoid whole milk, cream, ice cream, egg yolks and cheeses. Healthy desserts include angel food cake, ginger snaps, animal crackers, hard candy, popsicles, and low-fat/nonfat frozen yogurt. Avoid pastries, cakes, pies and cookies.  Exercise.  A regular program helps decrease LDL and raises HDL.  Helps with weight control.  Do things that increase your activity level like gardening, walking, or taking the stairs.  Medication.  May be  prescribed by your caregiver to help lowering cholesterol and the risk for heart disease.  You may need medicine even if your levels are normal if you have several risk factors. HOME CARE INSTRUCTIONS   Follow your diet and exercise programs as suggested by your caregiver.  Take medications as directed.  Have blood work done when your caregiver feels it is necessary. MAKE SURE YOU:   Understand these instructions.  Will watch your condition.  Will get help right away if you are not doing well or get worse. Document Released: 09/02/2001 Document Revised: 03/01/2012 Document Reviewed: 02/23/2008 [facility] Patient Information 2014 [place], [place]. Diabetes and Exercise Exercising regularly is important. It is not just about losing weight. It has many health benefits, such as:  Improving your overall fitness, flexibility, and endurance.  Increasing your bone density.  Helping with weight control.  Decreasing your body fat.  Increasing your muscle strength.  Reducing stress and tension.  Improving your overall health. People with diabetes who exercise gain additional benefits because exercise:  Reduces appetite.  Improves the body's use of blood sugar (glucose).  Helps lower or control blood glucose.  Decreases blood  pressure.  Helps control blood lipids (such as cholesterol and triglycerides).  Improves the body's use of the hormone insulin by:  Increasing the body's insulin sensitivity.  Reducing the body's insulin needs.  Decreases the risk for heart disease because exercising:  Lowers cholesterol and triglycerides levels.  Increases the levels of good cholesterol (such as high-density lipoproteins [HDL]) in the body.  Lowers blood glucose levels. YOUR ACTIVITY PLAN  Choose an activity that you enjoy and set realistic goals. Your health care provider or diabetes educator can help you make an activity plan that works for you. You can break activities into 2 or  3 sessions throughout the day. Doing so is as good as one long session. Exercise ideas include:  Taking the dog for a walk.  Taking the stairs instead of the elevator.  Dancing to your favorite song.  Doing your favorite exercise with a friend. RECOMMENDATIONS FOR EXERCISING WITH TYPE 1 OR TYPE 2 DIABETES   Check your blood glucose before exercising. If blood glucose levels are greater than 240 mg/dL, check for urine ketones. Do not exercise if ketones are present.  Avoid injecting insulin into areas of the body that are going to be exercised. For example, avoid injecting insulin into:  The arms when playing tennis.  The legs when jogging.  Keep a record of:  Food intake before and after you exercise.  Expected peak times of insulin action.  Blood glucose levels before and after you exercise.  The type and amount of exercise you have done.  Review your records with your health care provider. Your health care provider will help you to develop guidelines for adjusting food intake and insulin amounts before and after exercising.  If you take insulin or oral hypoglycemic agents, watch for signs and symptoms of hypoglycemia. They include:  Dizziness.  Shaking.  Sweating.  Chills.  Confusion.  Drink plenty of water while you exercise to prevent dehydration or heat stroke. Body water is lost during exercise and must be replaced.  Talk to your health care provider before starting an exercise program to make sure it is safe for you. Remember, almost any type of activity is better than none. Document Released: 02/28/2004 Document Revised: 08/10/2013 Document Reviewed: 05/17/2013 [facility] Patient Information 2014 [place].

## 2013-11-16 NOTE — Progress Notes (Signed)
Patient ID: Jocelyn Camacho, female   DOB: 07/22/1946, 67 y.o.   MRN: [id number]   This very nice 67 yo MBFpresents for 3 month follow up with hypertension, hyperlipidemia, T2 diabetes and vitamin D deficiency.    BP has been controlled at home. Today's BP is 130/82 at goal. Patient denies any cardiac type chest pain, palpitations, dyspnea/orthopnea/PND, dizziness, claudication, or dependent edema.   Hyperlipidemia is controlled with diet & meds. Last cholesterol was 126 and LDL 63 at goal. Patient denies myalgias or other med SE's.    Also, the patient's T2 diabetes has been controlled with diet and medications. Last A1c was 6.1%. She alleges her FBG's range betw. 100-120 mg%. Patient denies any symptoms of reactive hypoglycemia, diabetic polys, paresthesias or recent visual blurring.   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   Further, Patient has history of vitamin D deficiency with last vitamin D of 55 in September.. Patient supplements vitamin D without any suspected side-effects.  Current Outpatient Prescriptions on File Prior to Visit  Medication Sig Dispense Refill  . atenolol (TENORMIN) 100 MG tablet Take 50 mg by mouth daily.      Marland Kitchen b complex vitamins tablet Take 1 tablet by mouth daily.      . calcium carbonate (OS-CAL) 600 MG TABS Take 600 mg by mouth 2 (two) times daily with a meal.      . Cholecalciferol (VITAMIN D3) 2000 UNITS TABS Take 2,000 mcg by mouth 3 (three) times daily.      . Flaxseed, Linseed, (FLAXSEED OIL PO) Take by mouth 2 (two) times daily.      . furosemide (LASIX) 40 MG tablet Take 40 mg by mouth daily.      . hyoscyamine (LEVSIN, ANASPAZ) 0.125 MG tablet Take 0.125 mg by mouth every 6 (six) hours as needed.       Glipizide 5 mg 1/2 tab TID ac    . metFORMIN (GLUCOPHAGE) 500 MG tablet Take 1,000 mg by mouth 2 (two) times daily with a meal.       . Multiple Vitamin (MULTIVITAMIN) tablet Take 1 tablet by mouth daily.      . naproxen (NAPROSYN) 250 MG tablet Take 250 mg by mouth as needed.       CPAP @ 8 cm           . potassium chloride (K-DUR) 10 MEQ tablet Take 1 tablet (10 mEq total) by mouth daily.  30 tablet  3      Allergies  Allergen Reactions  . Penicillins Swelling    PMHx:   Past Medical History  Diagnosis Date  . Hypertension   . Diabetes mellitus   . Thyroid disease   . Hyperlipidemia   . Obesity   . OSA (obstructive sleep apnea)   . Vitamin D deficiency   . Allergic rhinitis     FHx:    Reviewed / unchanged  SHx:    Reviewed / unchanged  Systems Review: Constitutional: Denies fever, chills, wt changes, headaches, insomnia, fatigue, night sweats, change in appetite. Eyes: Denies redness, blurred vision, diplopia, discharge, itchy, watery eyes.  ENT: Denies discharge, congestion, post nasal drip, epistaxis, sore throat, earache, hearing loss, dental pain, tinnitus, vertigo, sinus pain, snoring.  CV: Denies chest pain, palpitations, irregular heartbeat, syncope, dyspnea, diaphoresis, orthopnea, PND, claudication, edema. Respiratory: denies cough, dyspnea, DOE, pleurisy, hoarseness, laryngitis, wheezing.  Gastrointestinal: Denies dysphagia, odynophagia, heartburn, reflux, water brash, abdominal pain or cramps, nausea, vomiting, bloating, diarrhea, constipation, hematemesis, melena, hematochezia,  Hemorrhoids. Genitourinary: Denies dysuria, frequency, urgency, nocturia, hesitancy, discharge, hematuria, flank pain. Musculoskeletal: Denies arthralgias, myalgias, stiffness, jt. swelling, pain, limp, strain/sprain.  Skin: Denies pruritus, rash, hives, warts, acne, eczema, change in skin lesion(s). Neuro: No weakness, tremor, incoordination, spasms, paresthesia, or pain. Psychiatric: Denies confusion, memory loss, or sensory loss. Endo: Denies change in weight, skin, hair change.  Heme/Lymph: No excessive  bleeding, bruising, orenlarged lymph nodes.  Filed Vitals:   11/16/13 0839  BP: 130/82  Pulse: 66  Temp: 97.9 F (36.6 C)  Resp: 16    BMI is 42.84 kg/(m^2)   Height  08/29/13: 5\' 3"   . Weight  241 lb 12.8 oz  On Exam: Appears well nourished - in no distress. Eyes: PERRLA, EOMs, conjunctiva no swelling or erythema. Sinuses: No frontal/maxillary tenderness ENT/Mouth: EAC's clear, TM's nl w/o erythema, bulging. Nares clear w/o erythema, swelling, exudates. Oropharynx clear without erythema or exudates. Oral hygiene is good. Tongue normal, non obstructing. Hearing intact.  Neck: Supple. Thyroid nl. Car 2+/2+ without bruits, nodes or JVD. Chest: Respirations nl with BS clear & equal w/o rales, rhonchi, wheezing or stridor.  Cor: Heart sounds normal w/ regular rate and rhythm without sig. murmurs, gallops, clicks, or rubs. Peripheral pulses normal and equal  without edema.  Abdomen: Soft & bowel sounds normal. Non-tender w/o guarding, rebound, hernias, masses, or organomegaly.  Lymphatics: Unremarkable.  Musculoskeletal: Full ROM all peripheral extremities, joint stability, 5/5 strength, and normal gait.  Skin: Warm, dry without exposed rashes, lesions, ecchymosis apparent.  Neuro: Cranial nerves intact, reflexes equal bilaterally. Sensory-motor testing grossly intact. Tendon reflexes grossly intact.  Pysch: Alert & oriented x 3. Insight and judgement nl & appropriate. No ideations.  Assessment and Plan:  1. Hypertension - Continue monitor blood pressure at home. Continue diet/meds same.  2. Hyperlipidemia - Continue diet/meds, exercise,& lifestyle modifications. Continue monitor periodic cholesterol/liver & renal functions   3. Diabetes - continue recommend prudent low glycemic diet, strongly advising weight control, regular exercise, diabetic monitoring and continue periodic eye exams with Dr.s Groat & Dr Zadie Rhine.  4. Vitamin D Deficiency - Continue supplementation.  5. Morbid  Obesity - again weight loss encouraged.  Further disposition pending results of labs.

## 2013-11-16 NOTE — Telephone Encounter (Signed)
Pt would like weight from appt with Kelby Aline

## 2013-11-17 LAB — INSULIN, FASTING: Insulin fasting, serum: 13 u[IU]/mL (ref 3–28)

## 2013-11-21 ENCOUNTER — Telehealth: Payer: Self-pay | Admitting: *Deleted

## 2013-11-24 NOTE — Telephone Encounter (Signed)
DONE

## 2013-12-13 ENCOUNTER — Other Ambulatory Visit: Payer: Self-pay | Admitting: Internal Medicine

## 2013-12-13 ENCOUNTER — Other Ambulatory Visit: Payer: Self-pay | Admitting: Emergency Medicine

## 2013-12-26 ENCOUNTER — Ambulatory Visit: Payer: Self-pay

## 2013-12-28 ENCOUNTER — Encounter (INDEPENDENT_AMBULATORY_CARE_PROVIDER_SITE_OTHER): Payer: Medicare Other | Admitting: Ophthalmology

## 2013-12-28 DIAGNOSIS — H348192 Central retinal vein occlusion, unspecified eye, stable: Secondary | ICD-10-CM

## 2013-12-28 DIAGNOSIS — H43819 Vitreous degeneration, unspecified eye: Secondary | ICD-10-CM

## 2013-12-28 DIAGNOSIS — I10 Essential (primary) hypertension: Secondary | ICD-10-CM

## 2013-12-28 DIAGNOSIS — H35039 Hypertensive retinopathy, unspecified eye: Secondary | ICD-10-CM

## 2013-12-28 DIAGNOSIS — H2510 Age-related nuclear cataract, unspecified eye: Secondary | ICD-10-CM

## 2014-02-01 ENCOUNTER — Encounter (INDEPENDENT_AMBULATORY_CARE_PROVIDER_SITE_OTHER): Payer: Medicare Other | Admitting: Ophthalmology

## 2014-02-01 DIAGNOSIS — H348192 Central retinal vein occlusion, unspecified eye, stable: Secondary | ICD-10-CM

## 2014-02-01 DIAGNOSIS — H43819 Vitreous degeneration, unspecified eye: Secondary | ICD-10-CM

## 2014-02-01 DIAGNOSIS — I10 Essential (primary) hypertension: Secondary | ICD-10-CM

## 2014-02-01 DIAGNOSIS — H35039 Hypertensive retinopathy, unspecified eye: Secondary | ICD-10-CM

## 2014-02-01 DIAGNOSIS — H2510 Age-related nuclear cataract, unspecified eye: Secondary | ICD-10-CM

## 2014-02-17 ENCOUNTER — Ambulatory Visit: Payer: Self-pay | Admitting: Emergency Medicine

## 2014-02-22 ENCOUNTER — Encounter: Payer: Self-pay | Admitting: Emergency Medicine

## 2014-02-22 ENCOUNTER — Ambulatory Visit (INDEPENDENT_AMBULATORY_CARE_PROVIDER_SITE_OTHER): Payer: Medicare Other | Admitting: Emergency Medicine

## 2014-02-22 VITALS — BP 138/82 | HR 76 | Temp 97.8°F | Resp 16 | Ht 63.0 in | Wt 250.0 lb

## 2014-02-22 DIAGNOSIS — E669 Obesity, unspecified: Secondary | ICD-10-CM

## 2014-02-22 DIAGNOSIS — I10 Essential (primary) hypertension: Secondary | ICD-10-CM

## 2014-02-22 DIAGNOSIS — E782 Mixed hyperlipidemia: Secondary | ICD-10-CM

## 2014-02-22 DIAGNOSIS — E119 Type 2 diabetes mellitus without complications: Secondary | ICD-10-CM

## 2014-02-22 DIAGNOSIS — E039 Hypothyroidism, unspecified: Secondary | ICD-10-CM

## 2014-02-22 LAB — CBC WITH DIFFERENTIAL/PLATELET
BASOS PCT: 0 % (ref 0–1)
Basophils Absolute: 0 10*3/uL (ref 0.0–0.1)
EOS PCT: 2 % (ref 0–5)
Eosinophils Absolute: 0.2 10*3/uL (ref 0.0–0.7)
HEMATOCRIT: 38.3 % (ref 36.0–46.0)
Hemoglobin: 12.9 g/dL (ref 12.0–15.0)
Lymphocytes Relative: 36 % (ref 12–46)
Lymphs Abs: 3.6 10*3/uL (ref 0.7–4.0)
MCH: 27.1 pg (ref 26.0–34.0)
MCHC: 33.7 g/dL (ref 30.0–36.0)
MCV: 80.5 fL (ref 78.0–100.0)
MONO ABS: 0.4 10*3/uL (ref 0.1–1.0)
Monocytes Relative: 4 % (ref 3–12)
NEUTROS ABS: 5.8 10*3/uL (ref 1.7–7.7)
Neutrophils Relative %: 58 % (ref 43–77)
Platelets: 411 10*3/uL — ABNORMAL HIGH (ref 150–400)
RBC: 4.76 MIL/uL (ref 3.87–5.11)
RDW: 14.9 % (ref 11.5–15.5)
WBC: 10 10*3/uL (ref 4.0–10.5)

## 2014-02-22 LAB — HEPATIC FUNCTION PANEL
ALBUMIN: 4.2 g/dL (ref 3.5–5.2)
ALK PHOS: 80 U/L (ref 39–117)
ALT: 17 U/L (ref 0–35)
AST: 16 U/L (ref 0–37)
BILIRUBIN INDIRECT: 0.5 mg/dL (ref 0.2–1.2)
BILIRUBIN TOTAL: 0.6 mg/dL (ref 0.2–1.2)
Bilirubin, Direct: 0.1 mg/dL (ref 0.0–0.3)
TOTAL PROTEIN: 7.6 g/dL (ref 6.0–8.3)

## 2014-02-22 LAB — BASIC METABOLIC PANEL WITH GFR
BUN: 19 mg/dL (ref 6–23)
CALCIUM: 10.2 mg/dL (ref 8.4–10.5)
CO2: 30 meq/L (ref 19–32)
Chloride: 100 mEq/L (ref 96–112)
Creat: 0.88 mg/dL (ref 0.50–1.10)
GFR, Est African American: 79 mL/min
GFR, Est Non African American: 68 mL/min
GLUCOSE: 144 mg/dL — AB (ref 70–99)
Potassium: 4.7 mEq/L (ref 3.5–5.3)
Sodium: 141 mEq/L (ref 135–145)

## 2014-02-22 LAB — LIPID PANEL
CHOL/HDL RATIO: 3.9 ratio
Cholesterol: 132 mg/dL (ref 0–200)
HDL: 34 mg/dL — AB (ref >39)
LDL CALC: 64 mg/dL (ref 0–99)
Triglycerides: 172 mg/dL — ABNORMAL HIGH (ref <150)
VLDL: 34 mg/dL (ref 0–40)

## 2014-02-22 LAB — TSH: TSH: 0.259 u[IU]/mL — ABNORMAL LOW (ref 0.350–4.500)

## 2014-02-22 LAB — HEMOGLOBIN A1C
HEMOGLOBIN A1C: 6.4 % — AB (ref <5.7)
Mean Plasma Glucose: 137 mg/dL — ABNORMAL HIGH (ref <117)

## 2014-02-22 MED ORDER — FLUCONAZOLE 150 MG PO TABS: 150.0000 mg | ORAL_TABLET | ORAL | Status: DC

## 2014-02-22 MED ORDER — HYOSCYAMINE SULFATE 0.125 MG PO TABS: 0.1250 mg | ORAL_TABLET | Freq: Four times a day (QID) | ORAL | Status: DC | PRN

## 2014-02-22 NOTE — Patient Instructions (Signed)
Diabetes and Exercise Exercising regularly is important. It is not just about losing weight. It has many health benefits, such as:  Improving your overall fitness, flexibility, and endurance.  Increasing your bone density.  Helping with weight control.  Decreasing your body fat.  Increasing your muscle strength.  Reducing stress and tension.  Improving your overall health. People with diabetes who exercise gain additional benefits because exercise:  Reduces appetite.  Improves the body's use of blood sugar (glucose).  Helps lower or control blood glucose.  Decreases blood pressure.  Helps control blood lipids (such as cholesterol and triglycerides).  Improves the body's use of the hormone insulin by:  Increasing the body's insulin sensitivity.  Reducing the body's insulin needs.  Decreases the risk for heart disease because exercising:  Lowers cholesterol and triglycerides levels.  Increases the levels of good cholesterol (such as high-density lipoproteins [HDL]) in the body.  Lowers blood glucose levels. YOUR ACTIVITY PLAN  Choose an activity that you enjoy and set realistic goals. Your health care provider or diabetes educator can help you make an activity plan that works for you. You can break activities into 2 or 3 sessions throughout the day. Doing so is as good as one long session. Exercise ideas include:  Taking the dog for a walk.  Taking the stairs instead of the elevator.  Dancing to your favorite song.  Doing your favorite exercise with a friend. RECOMMENDATIONS FOR EXERCISING WITH TYPE 1 OR TYPE 2 DIABETES   Check your blood glucose before exercising. If blood glucose levels are greater than 240 mg/dL, check for urine ketones. Do not exercise if ketones are present.  Avoid injecting insulin into areas of the body that are going to be exercised. For example, avoid injecting insulin into:  The arms when playing tennis.  The legs when  jogging.  Keep a record of:  Food intake before and after you exercise.  Expected peak times of insulin action.  Blood glucose levels before and after you exercise.  The type and amount of exercise you have done.  Review your records with your health care provider. Your health care provider will help you to develop guidelines for adjusting food intake and insulin amounts before and after exercising.  If you take insulin or oral hypoglycemic agents, watch for signs and symptoms of hypoglycemia. They include:  Dizziness.  Shaking.  Sweating.  Chills.  Confusion.  Drink plenty of water while you exercise to prevent dehydration or heat stroke. Body water is lost during exercise and must be replaced.  Talk to your health care provider before starting an exercise program to make sure it is safe for you. Remember, almost any type of activity is better than none. Document Released: 02/28/2004 Document Revised: 08/10/2013 Document Reviewed: 05/17/2013 [facility] Patient Information 2014 [place].   Bad carbs also include fruit juice, alcohol, and sweet tea. These are empty calories that do not signal to your brain that you are full.   Please remember the good carbs are still carbs which convert into sugar. So please measure them out no more than 1/2-1 cup of rice, oatmeal, pasta, and beans.  Veggies are however free foods! Pile them on.   I like lean protein at every meal such as chicken, [place], pork chops, cottage cheese, etc. Just do not fry these meats and please center your meal around vegetable, the meats should be a side dish.   No all fruit is created equal. Please see the list below, the fruit  at the bottom is higher in sugars than the fruit at the top   We want weight loss that will last so you should lose 1-2 pounds a week.  THAT IS IT! Please pick THREE things a month to change. Once it is a habit check off the item. Then pick another three items off the list to  become habits.  If you are already doing a habit on the list GREAT!  Cross that item off! o Don't drink your calories. Ie, alcohol, soda, fruit juice, and sweet tea.  o Drink more water. Drink a glass when you feel hungry or before each meal.  o Eat breakfast - Complex carb and protein (likeDannon light and fit yogurt, oatmeal, fruit, eggs, [place] bacon). o Measure your cereal.  Eat no more than one cup a day. (ie [place]) o Eat an apple a day. o Add a vegetable a day. o Try a new vegetable a month. o Use Pam! Stop using oil or butter to cook. o Don't finish your plate or use smaller plates. o Share your dessert. o Eat sugar free Jello for dessert or frozen grapes. o Don't eat 2-3 hours before bed. o Switch to whole wheat bread, pasta, and brown rice. o Make healthier choices when you eat out. No fries! o Pick baked chicken, NOT fried. o Don't forget to SLOW DOWN when you eat. It is not going anywhere.  o Take the stairs. o Park far away in the parking lot o [employer] (or weights) for 10 minutes while watching TV. o Walk at work for 10 minutes during break. o Walk outside 1 time a week with your friend, kids, dog, or significant other. o Start a walking group at [place] the mall as much as you can tolerate.  o Keep a food diary. o Weigh yourself daily. o Walk for 15 minutes 3 days per week. o Cook at home more often and eat out less.  If life happens and you go back to old habits, it is okay.  Just start over. You can do it!   If you experience chest pain, get short of breath, or tired during the exercise, please stop immediately and inform your doctor.

## 2014-02-22 NOTE — Progress Notes (Signed)
Subjective:    Patient ID: Jocelyn Camacho, female    DOB: [birth year], 68 y.o.   MRN: [id number]  HPI Comments: 68 yo AAF presents for 3 month F/U for HTN, Cholesterol, DM, D. Deficient, Hypothyroid. She changed thyroid dose AD at last OV, but did not get f/u AD. She is not exercising routinely. She is eating descent. She occasionally skips meals. She notes mild fatigue. BS 120s-90s. BP good at home. She is checking feet routinely, denies breakdown/ numbness/ tingling/ pain. She admits to mild fatigue occasionally  LAST LABS CHOL         133   11/16/2013 HDL           35   11/16/2013 LDLCALC       73   11/16/2013 TRIG         125   11/16/2013 CHOLHDL      3.8   11/16/2013 ALT           16   11/16/2013 AST           18   11/16/2013 ALKPHOS       81   11/16/2013 BILITOT      0.6   11/16/2013 CREATININE     0.79   11/16/2013 BUN              16   11/16/2013 NA              140   11/16/2013 K               4.3   11/16/2013 CL              103   11/16/2013 CO2              29   11/16/2013 HGBA1C      5.9   11/16/2013 VIT D 58 TSH    0.068   11/16/2013 INSULIN 13  Diabetes Associated symptoms include fatigue.  Hypertension     Medication List       This list is accurate as of: 02/22/14 10:02 AM.  Always use your most recent med list.               atenolol 100 MG tablet  Commonly known as:  TENORMIN  Take 50 mg by mouth daily.     b complex vitamins tablet  Take 1 tablet by mouth daily.     BESIVANCE 0.6 % Susp  Generic drug:  Besifloxacin HCl     calcium carbonate 600 MG Tabs tablet  Commonly known as:  OS-CAL  Take 600 mg by mouth 2 (two) times daily with a meal.     enalapril 20 MG tablet  Commonly known as:  VASOTEC     FLAXSEED OIL PO  Take by mouth 2 (two) times daily.     furosemide 40 MG tablet  Commonly known as:  LASIX  TAKE ONE TABLET BY MOUTH EVERY DAY     glipiZIDE 5 MG tablet  Commonly known as:  GLUCOTROL  1/2 to 1 tab TID ac     hyoscyamine  0.125 MG tablet  Commonly known as:  LEVSIN, ANASPAZ  Take 0.125 mg by mouth every 6 (six) hours as needed.     levothyroxine 200 MCG tablet  Commonly known as:  SYNTHROID, LEVOTHROID  TAKE AS DIRECTED     metFORMIN 500 MG tablet  Commonly known as:  GLUCOPHAGE  Take 1,000 mg by mouth 2 (two) times  daily with a meal.     multivitamin tablet  Take 1 tablet by mouth daily.     naproxen 250 MG tablet  Commonly known as:  NAPROSYN  Take 250 mg by mouth as needed.     potassium chloride 10 MEQ tablet  Commonly known as:  K-DUR  Take 1 tablet (10 mEq total) by mouth daily.     Vitamin D3 2000 UNITS Tabs  Take 2,000 mcg by mouth 3 (three) times daily.       Allergies  Allergen Reactions  . Penicillins Swelling   Past Medical History  Diagnosis Date  . Hypertension   . Diabetes mellitus   . Thyroid disease   . Hyperlipidemia   . Obesity   . OSA (obstructive sleep apnea)   . Vitamin D deficiency   . Allergic rhinitis       Review of Systems  Constitutional: Positive for fatigue.  All other systems reviewed and are negative.   BP 138/82  Pulse 76  Temp(Src) 97.8 F (36.6 C) (Temporal)  Resp 16  Ht 5\' 3"  (1.6 m)  Wt 250 lb (113.399 kg)  BMI 44.30 kg/m2     Objective:   Physical Exam  Nursing note and vitals reviewed. Constitutional: She is oriented to person, place, and time. She appears well-developed and well-nourished. No distress.  obese  HENT:  Head: Normocephalic and atraumatic.  Right Ear: External ear normal.  Left Ear: External ear normal.  Nose: Nose normal.  Mouth/Throat: Oropharynx is clear and moist.  Eyes: Conjunctivae and EOM are normal.  Neck: Normal range of motion. Neck supple. No JVD present. No thyromegaly present.  Cardiovascular: Normal rate, regular rhythm, normal heart sounds and intact distal pulses.   Pulmonary/Chest: Effort normal and breath sounds normal.  Abdominal: Soft. Bowel sounds are normal. She exhibits no distension  and no mass. There is no tenderness. There is no rebound and no guarding.  Musculoskeletal: Normal range of motion. She exhibits no edema and no tenderness.  Lymphadenopathy:    She has no cervical adenopathy.  Neurological: She is alert and oriented to person, place, and time. No cranial nerve deficit.  Skin: Skin is warm and dry. No rash noted. No erythema. No pallor.  Dry scaling heels, no callus, almost flat arch   Psychiatric: She has a normal mood and affect. Her behavior is normal. Judgment and thought content normal.          Assessment & Plan:  1.  3 month F/U for obesity, HTN, Cholesterol,DM, D. Deficient. Needs healthy diet, cardio QD and obtain healthy weight. Check Labs, Check BP if >130/80 call office, Check BS if >200 call office  2. Maintenance Updated- patient refuses vaccinations. Advised daily Vaseline application and monitoring of both feet.   3. Hypothyroid- Check labs, with recent dose change

## 2014-02-23 ENCOUNTER — Encounter: Payer: Self-pay | Admitting: Gastroenterology

## 2014-02-23 LAB — INSULIN, FASTING: Insulin fasting, serum: 16 u[IU]/mL (ref 3–28)

## 2014-03-08 ENCOUNTER — Encounter (INDEPENDENT_AMBULATORY_CARE_PROVIDER_SITE_OTHER): Payer: Medicare Other | Admitting: Ophthalmology

## 2014-03-08 DIAGNOSIS — H348192 Central retinal vein occlusion, unspecified eye, stable: Secondary | ICD-10-CM

## 2014-03-08 DIAGNOSIS — I10 Essential (primary) hypertension: Secondary | ICD-10-CM

## 2014-03-08 DIAGNOSIS — H43819 Vitreous degeneration, unspecified eye: Secondary | ICD-10-CM

## 2014-03-08 DIAGNOSIS — H2510 Age-related nuclear cataract, unspecified eye: Secondary | ICD-10-CM

## 2014-03-08 DIAGNOSIS — H35039 Hypertensive retinopathy, unspecified eye: Secondary | ICD-10-CM

## 2014-03-29 ENCOUNTER — Encounter: Payer: Self-pay | Admitting: Internal Medicine

## 2014-03-29 ENCOUNTER — Ambulatory Visit (INDEPENDENT_AMBULATORY_CARE_PROVIDER_SITE_OTHER): Payer: Medicare Other | Admitting: Internal Medicine

## 2014-03-29 VITALS — BP 146/84 | HR 76 | Temp 97.2°F | Resp 16 | Ht 63.0 in | Wt 248.4 lb

## 2014-03-29 DIAGNOSIS — I10 Essential (primary) hypertension: Secondary | ICD-10-CM

## 2014-03-29 DIAGNOSIS — Z79899 Other long term (current) drug therapy: Secondary | ICD-10-CM

## 2014-03-29 DIAGNOSIS — E039 Hypothyroidism, unspecified: Secondary | ICD-10-CM

## 2014-03-29 LAB — TSH: TSH: 2.256 u[IU]/mL (ref 0.350–4.500)

## 2014-03-29 MED ORDER — LEVOTHYROXINE SODIUM 100 MCG PO TABS: 100.0000 ug | ORAL_TABLET | Freq: Every day | ORAL | Status: DC

## 2014-03-29 MED ORDER — FUROSEMIDE 40 MG PO TABS: 40.0000 mg | ORAL_TABLET | Freq: Two times a day (BID) | ORAL | Status: DC

## 2014-03-29 NOTE — Progress Notes (Signed)
Patient ID: Jocelyn Camacho, female   DOB: 20-Dec-1946, 68 y.o.   MRN: [id number]    This very nice 68 y.o. MBF presents for 3 month follow up with Hypertension, Hyperlipidemia, Morbid Obesity, T2 NIDDM and Vitamin D Deficiency who returns for 1 month F/U of tapering thyroid dose for suppressed TSH and she reports no Sx's noted related to dose change   HTN predates since 1994. Today's BP is 146/84. Patient denies any cardiac type chest pain, palpitations, dyspnea/orthopnea/PND, dizziness, claudication, or dependent edema.   Hyperlipidemia is controlled with diet & meds. Last Cholesterol was132, Triglycerides were  172, HDL 34  and LDL 64 in Mar 2014 - at goal. Patient denies myalgias or other med SE's.    Also, the patient has history of T2 NIDDM since 2007  with last A1c of  6.4% in Mar 2014. Patient denies any symptoms of reactive hypoglycemia, diabetic polys, paresthesias or visual blurring.   Further, Patient has history of Vitamin D Deficiency with vitamin D of 55 in Sept 2014. Patient supplements vitamin D without any suspected side-effects.  Medication Sig  . atenolol (TENORMIN) 100 MG tablet Take 50 mg by mouth daily.  . B complex vitamins tablet Take 1 tablet by mouth daily.  Marland Kitchen BESIVANCE 0.6 % SUSP   . calcium carbonate (OS-CAL) 600 MG TABS Take 600 mg by mouth 2 (two) times daily with a meal.  . Cholecalciferol (VITAMIN D3) 2000 UNITS TABS Take 2,000 mcg by mouth 3 (three) times daily.  . enalapril (VASOTEC) 20 MG tablet   . Flaxseed, Linseed, (FLAXSEED OIL PO) Take by mouth 2 (two) times daily.  . fluconazole (DIFLUCAN) 150 MG tablet Take 1 tablet (150 mg total) by mouth once a week.  Marland Kitchen glipiZIDE (GLUCOTROL) 5 MG tablet 1/2 to 1 tab TID ac  . hyoscyamine (LEVSIN, ANASPAZ) 0.125 MG tablet Take 1 tablet (0.125 mg total) by mouth every 6 (six) hours as needed.  . metFORMIN (GLUCOPHAGE) 500 MG tablet Take 1,000 mg by mouth 2 (two) times daily with a meal.  . Multiple Vitamin (MULTIVITAMIN)  tablet Take 1 tablet by mouth daily.  . naproxen (NAPROSYN) 250 MG tablet Take 250 mg by mouth as needed.  . potassium chloride (K-DUR) 10 MEQ tablet Take 1 tablet (10 mEq total) by mouth daily.      Allergies  Allergen Reactions  . Penicillins Swelling    PMHx:   Past Medical History  Diagnosis Date  . Hypertension   . Diabetes mellitus   . Thyroid disease   . Hyperlipidemia   . Obesity   . OSA (obstructive sleep apnea)   . Vitamin D deficiency   . Allergic rhinitis     FHx:    Reviewed / unchanged  SHx:    Reviewed / unchanged   Systems Review: Constitutional: Denies fever, chills, wt changes, headaches, insomnia, fatigue, night sweats, change in appetite. Eyes: Denies redness, blurred vision, diplopia, discharge, itchy, watery eyes.  ENT: Denies discharge, congestion, post nasal drip, epistaxis, sore throat, earache, hearing loss, dental pain, tinnitus, vertigo, sinus pain, snoring.  CV: Denies chest pain, palpitations, irregular heartbeat, syncope, dyspnea, diaphoresis, orthopnea, PND, claudication, edema. Respiratory: denies cough, dyspnea, DOE, pleurisy, hoarseness, laryngitis, wheezing.  Gastrointestinal: Denies dysphagia, odynophagia, heartburn, reflux, water brash, abdominal pain or cramps, nausea, vomiting, bloating, diarrhea, constipation, hematemesis, melena, hematochezia,  or hemorrhoids. Genitourinary: Denies dysuria, frequency, urgency, nocturia, hesitancy, discharge, hematuria, flank pain. Musculoskeletal: Denies arthralgias, myalgias, stiffness, jt. swelling, pain, limp, strain/sprain.  Skin: Denies  pruritus, rash, hives, warts, acne, eczema, change in skin lesion(s). Neuro: No weakness, tremor, incoordination, spasms, paresthesia, or pain. Psychiatric: Denies confusion, memory loss, or sensory loss. Endo: Denies change in weight, skin, hair change.  Heme/Lymph: No excessive bleeding, bruising, orenlarged lymph nodes.   Exam:  BP 146/84  Pulse 76  Temp  97.2 F   Resp 16  Ht 5\' 3"    Wt 248 lb 6.4 oz   BMI 44.01 kg/m2  Appears well nourished - in no distress. Eyes: PERRLA, EOMs, conjunctiva no swelling or erythema. Sinuses: No frontal/maxillary tenderness ENT/Mouth: EAC's clear, TM's nl w/o erythema, bulging. Nares clear w/o erythema, swelling, exudates. Oropharynx clear without erythema or exudates. Oral hygiene is good. Tongue normal, non obstructing. Hearing intact.  Neck: Supple. Thyroid nl. Car 2+/2+ without bruits, nodes or JVD. Chest: Respirations nl with BS clear & equal w/o rales, rhonchi, wheezing or stridor.  Cor: Heart sounds normal w/ regular rate and rhythm without sig. murmurs, gallops, clicks, or rubs. Peripheral pulses normal and equal  without edema.  Abdomen: Soft & bowel sounds normal. Non-tender w/o guarding, rebound, hernias, masses, or organomegaly.  Lymphatics: Unremarkable.  Musculoskeletal: Full ROM all peripheral extremities, joint stability, 5/5 strength, and normal gait.  Skin: Warm, dry without exposed rashes, lesions, ecchymosis apparent.  Neuro: Cranial nerves intact, reflexes equal bilaterally. Sensory-motor testing grossly intact. Tendon reflexes grossly intact.  Pysch: Alert & oriented x 3. Insight and judgement nl & appropriate. No ideations.  Assessment and Plan:  1. Hypothyroidism - overcompensated  2. Hypertension - Continue monitor blood pressure at home. Continue diet/meds same.  3. Hyperlipidemia - Continue diet/meds, exercise,& lifestyle modifications. Continue monitor periodic cholesterol/liver & renal functions   4. T2 NIDDM w/Stage II CKD - continue recommend prudent low glycemic diet, weight control, regular exercise, diabetic monitoring and periodic eye exams.  5. Vitamin D Deficiency - Continue supplementation.  6. Morbid Obesity (BMI 44)   Recommended regular exercise, BP monitoring, weight loss, and discussed med and SE's. Recommended recheck TSH to assess and monitor clinical  status. Further disposition pending results of labs.   New Rx Thyroxine 100 mcg tabs so as to avoid pill splitting.

## 2014-03-29 NOTE — Patient Instructions (Signed)

## 2014-03-30 ENCOUNTER — Other Ambulatory Visit: Payer: Self-pay

## 2014-04-03 ENCOUNTER — Ambulatory Visit (AMBULATORY_SURGERY_CENTER): Payer: Self-pay | Admitting: *Deleted

## 2014-04-03 VITALS — Ht 64.0 in | Wt 246.2 lb

## 2014-04-03 DIAGNOSIS — Z1211 Encounter for screening for malignant neoplasm of colon: Secondary | ICD-10-CM

## 2014-04-03 MED ORDER — NA SULFATE-K SULFATE-MG SULF 17.5-3.13-1.6 GM/177ML PO SOLN: ORAL | Status: DC

## 2014-04-03 NOTE — Progress Notes (Signed)
No egg or soy allergy  No home oxygen use or problems with anesthesia

## 2014-04-04 ENCOUNTER — Telehealth: Payer: Self-pay | Admitting: Gastroenterology

## 2014-04-04 NOTE — Telephone Encounter (Signed)
Spoke with the pt. She states she can not afford to pay for the [place]. Advised pt to come to our office to pick up a free sample of the [place]. She will come to the 4th floor tomorrow (04-05-14) to pick up the prep. She will call back if she has any further questions.

## 2014-04-06 ENCOUNTER — Encounter: Payer: Self-pay | Admitting: Gastroenterology

## 2014-04-07 ENCOUNTER — Encounter (INDEPENDENT_AMBULATORY_CARE_PROVIDER_SITE_OTHER): Payer: Medicare Other | Admitting: Ophthalmology

## 2014-04-07 DIAGNOSIS — H348192 Central retinal vein occlusion, unspecified eye, stable: Secondary | ICD-10-CM

## 2014-04-07 DIAGNOSIS — I10 Essential (primary) hypertension: Secondary | ICD-10-CM

## 2014-04-07 DIAGNOSIS — H35039 Hypertensive retinopathy, unspecified eye: Secondary | ICD-10-CM

## 2014-04-07 DIAGNOSIS — H43819 Vitreous degeneration, unspecified eye: Secondary | ICD-10-CM

## 2014-04-07 DIAGNOSIS — H2510 Age-related nuclear cataract, unspecified eye: Secondary | ICD-10-CM

## 2014-04-17 ENCOUNTER — Ambulatory Visit (AMBULATORY_SURGERY_CENTER): Payer: Medicare Other | Admitting: Gastroenterology

## 2014-04-17 ENCOUNTER — Encounter: Payer: Self-pay | Admitting: Gastroenterology

## 2014-04-17 VITALS — BP 142/92 | HR 64 | Temp 96.6°F | Resp 23 | Ht 64.0 in | Wt 246.0 lb

## 2014-04-17 DIAGNOSIS — K5730 Diverticulosis of large intestine without perforation or abscess without bleeding: Secondary | ICD-10-CM

## 2014-04-17 DIAGNOSIS — Z1211 Encounter for screening for malignant neoplasm of colon: Secondary | ICD-10-CM

## 2014-04-17 MED ORDER — SODIUM CHLORIDE 0.9 % IV SOLN: 500.0000 mL | INTRAVENOUS | Status: DC

## 2014-04-17 NOTE — Op Note (Signed)
[facility]  [address] [employer] [address] [place], [id number]   COLONOSCOPY PROCEDURE REPORT  PATIENT: Jocelyn Camacho, Jocelyn Camacho  MR#: [id number] BIRTHDATE: [birth year] , 68  yrs. old GENDER: Female ENDOSCOPIST: Inda Castle, MD REFERRED [id number] Melford Aase, M.D. PROCEDURE DATE:  04/17/2014 PROCEDURE:   Colonoscopy, diagnostic First Screening Colonoscopy - Avg.  risk and is 50 yrs.  old or older - No.  Prior Negative Screening - Now for repeat screening. 10 or more years since last screening  History of Adenoma - Now for follow-up colonoscopy & has been > or = to 3 yrs.  N/A  Polyps Removed Today? No.  Recommend repeat exam, <10 yrs? No. ASA CLASS:   Class II INDICATIONS:Average risk patient for colon cancer. MEDICATIONS: MAC sedation, administered by CRNA and Propofol (Diprivan) 170 mg IV  DESCRIPTION OF PROCEDURE:   After the risks benefits and alternatives of the procedure were thoroughly explained, informed consent was obtained.  A digital rectal exam revealed no abnormalities of the rectum.   The LB [id number]  endoscope was introduced through the anus and advanced to the cecum, which was identified by both the appendix and ileocecal valve. No adverse events experienced.   The quality of the prep was Suprep good  The instrument was then slowly withdrawn as the colon was fully examined.      COLON FINDINGS: There was moderate scattered diverticulosis noted in the sigmoid colon, descending colon, transverse colon, and ascending colon.   The colon was otherwise normal.  There was no diverticulosis, inflammation, polyps or cancers unless previously stated.  Retroflexed views revealed no abnormalities. The time to cecum=3 minutes 18 seconds.  Withdrawal time=9 minutes 38 seconds. The scope was withdrawn and the procedure completed. COMPLICATIONS: There were no complications.  ENDOSCOPIC IMPRESSION: 1.   There was moderate diverticulosis noted in the sigmoid  colon, descending colon, transverse colon, and ascending colon 2.   The colon was otherwise normal  RECOMMENDATIONS: Continue current colorectal screening recommendations for "routine risk" patients with a repeat colonoscopy in 10 years.   eSigned:  Inda Castle, MD 04/17/2014 9:52 AM   cc:

## 2014-04-17 NOTE — Progress Notes (Signed)
No complaints noted in the recovery room. Maw   

## 2014-04-17 NOTE — Patient Instructions (Signed)
YOU HAD AN ENDOSCOPIC PROCEDURE TODAY AT THE [facility]: Refer to the procedure report that was given to you for any specific questions about what was found during the examination.  If the procedure report does not answer your questions, please call your gastroenterologist to clarify.  If you requested that your care partner not be given the details of your procedure findings, then the procedure report has been included in a sealed envelope for you to review at your convenience later.  YOU SHOULD EXPECT: Some feelings of bloating in the abdomen. Passage of more gas than usual.  Walking can help get rid of the air that was put into your GI tract during the procedure and reduce the bloating. If you had a lower endoscopy (such as a colonoscopy or flexible sigmoidoscopy) you may notice spotting of blood in your stool or on the toilet paper. If you underwent a bowel prep for your procedure, then you may not have a normal bowel movement for a few days.  DIET: Your first meal following the procedure should be a light meal and then it is ok to progress to your normal diet.  A half-sandwich or bowl of soup is an example of a good first meal.  Heavy or fried foods are harder to digest and may make you feel nauseous or bloated.  Likewise meals heavy in dairy and vegetables can cause extra gas to form and this can also increase the bloating.  Drink plenty of fluids but you should avoid alcoholic beverages for 24 hours.  ACTIVITY: Your care partner should take you home directly after the procedure.  You should plan to take it easy, moving slowly for the rest of the day.  You can resume normal activity the day after the procedure however you should NOT DRIVE or use heavy machinery for 24 hours (because of the sedation medicines used during the test).    SYMPTOMS TO REPORT IMMEDIATELY: A gastroenterologist can be reached at any hour.  During normal business hours, 8:30 AM to 5:00 PM Monday through Friday,  call [phone].  After hours and on weekends, please call the GI answering service at [phone] who will take a message and have the physician on call contact you.   Following lower endoscopy (colonoscopy or flexible sigmoidoscopy):  Excessive amounts of blood in the stool  Significant tenderness or worsening of abdominal pains  Swelling of the abdomen that is new, acute  Fever of 100F or higher  Following upper endoscopy (EGD)  Vomiting of blood or coffee ground material  New chest pain or pain under the shoulder blades  Painful or persistently difficult swallowing  New shortness of breath  Fever of 100F or higher  Black, tarry-looking stools  FOLLOW UP: If any biopsies were taken you will be contacted by phone or by letter within the next 1-3 weeks.  Call your gastroenterologist if you have not heard about the biopsies in 3 weeks.  Our staff will call the home number listed on your records the next business day following your procedure to check on you and address any questions or concerns that you may have at that time regarding the information given to you following your procedure. This is a courtesy call and so if there is no answer at the home number and we have not heard from you through the emergency physician on call, we will assume that you have returned to your regular daily activities without incident.  SIGNATURES/CONFIDENTIALITY: You and/or your care   partner have signed paperwork which will be entered into your electronic medical record.  These signatures attest to the fact that that the information above on your After Visit Summary has been reviewed and is understood.  Full responsibility of the confidentiality of this discharge information lies with you and/or your care-partner.   Handouts were given to your care partner on  Diverticulosis and a high fiber diet with liberal fluid intake. You may resume your current medications today. Please call if any questions  or concerns.

## 2014-04-17 NOTE — Progress Notes (Signed)
Procedure ends, to recovery, report given and VSS. 

## 2014-04-18 ENCOUNTER — Telehealth: Payer: Self-pay | Admitting: *Deleted

## 2014-04-18 NOTE — Telephone Encounter (Signed)
  Follow up Call-  Call back number 04/17/2014  Post procedure Call Back phone  # [phone]  Permission to leave phone message Yes     Patient questions:  Do you have a fever, pain , or abdominal swelling? no Pain Score  0 *  Have you tolerated food without any problems? yes  Have you been able to return to your normal activities? yes  Do you have any questions about your discharge instructions: Diet   no Medications  no Follow up visit  no  Do you have questions or concerns about your Care? no  Actions: * If pain score is 4 or above: No action needed, pain <4.

## 2014-05-02 ENCOUNTER — Other Ambulatory Visit: Payer: Self-pay | Admitting: Internal Medicine

## 2014-05-05 ENCOUNTER — Encounter (INDEPENDENT_AMBULATORY_CARE_PROVIDER_SITE_OTHER): Payer: Medicare Other | Admitting: Ophthalmology

## 2014-05-05 ENCOUNTER — Other Ambulatory Visit: Payer: Self-pay | Admitting: Internal Medicine

## 2014-05-05 DIAGNOSIS — H348192 Central retinal vein occlusion, unspecified eye, stable: Secondary | ICD-10-CM

## 2014-05-05 DIAGNOSIS — H348392 Tributary (branch) retinal vein occlusion, unspecified eye, stable: Secondary | ICD-10-CM

## 2014-05-05 DIAGNOSIS — H43819 Vitreous degeneration, unspecified eye: Secondary | ICD-10-CM

## 2014-05-05 DIAGNOSIS — I10 Essential (primary) hypertension: Secondary | ICD-10-CM

## 2014-05-05 DIAGNOSIS — H35039 Hypertensive retinopathy, unspecified eye: Secondary | ICD-10-CM

## 2014-05-05 DIAGNOSIS — H2510 Age-related nuclear cataract, unspecified eye: Secondary | ICD-10-CM

## 2014-05-15 ENCOUNTER — Other Ambulatory Visit: Payer: Self-pay | Admitting: Internal Medicine

## 2014-05-16 ENCOUNTER — Ambulatory Visit: Payer: Self-pay | Admitting: Physician Assistant

## 2014-06-02 ENCOUNTER — Encounter (INDEPENDENT_AMBULATORY_CARE_PROVIDER_SITE_OTHER): Payer: Medicare Other | Admitting: Ophthalmology

## 2014-06-02 DIAGNOSIS — H348192 Central retinal vein occlusion, unspecified eye, stable: Secondary | ICD-10-CM

## 2014-06-02 DIAGNOSIS — H348392 Tributary (branch) retinal vein occlusion, unspecified eye, stable: Secondary | ICD-10-CM

## 2014-06-02 DIAGNOSIS — H43819 Vitreous degeneration, unspecified eye: Secondary | ICD-10-CM

## 2014-06-02 DIAGNOSIS — H35039 Hypertensive retinopathy, unspecified eye: Secondary | ICD-10-CM

## 2014-06-02 DIAGNOSIS — I10 Essential (primary) hypertension: Secondary | ICD-10-CM

## 2014-06-06 ENCOUNTER — Ambulatory Visit (INDEPENDENT_AMBULATORY_CARE_PROVIDER_SITE_OTHER): Payer: Medicare Other | Admitting: Physician Assistant

## 2014-06-06 ENCOUNTER — Encounter: Payer: Self-pay | Admitting: Physician Assistant

## 2014-06-06 VITALS — BP 130/72 | HR 72 | Temp 97.7°F | Resp 16 | Ht 63.0 in | Wt 250.0 lb

## 2014-06-06 DIAGNOSIS — E782 Mixed hyperlipidemia: Secondary | ICD-10-CM

## 2014-06-06 DIAGNOSIS — G4733 Obstructive sleep apnea (adult) (pediatric): Secondary | ICD-10-CM

## 2014-06-06 DIAGNOSIS — Z789 Other specified health status: Secondary | ICD-10-CM

## 2014-06-06 DIAGNOSIS — E559 Vitamin D deficiency, unspecified: Secondary | ICD-10-CM

## 2014-06-06 DIAGNOSIS — I10 Essential (primary) hypertension: Secondary | ICD-10-CM

## 2014-06-06 DIAGNOSIS — E1129 Type 2 diabetes mellitus with other diabetic kidney complication: Secondary | ICD-10-CM

## 2014-06-06 DIAGNOSIS — Z1331 Encounter for screening for depression: Secondary | ICD-10-CM

## 2014-06-06 DIAGNOSIS — E039 Hypothyroidism, unspecified: Secondary | ICD-10-CM

## 2014-06-06 DIAGNOSIS — Z79899 Other long term (current) drug therapy: Secondary | ICD-10-CM

## 2014-06-06 DIAGNOSIS — Z0000 Encounter for general adult medical examination without abnormal findings: Secondary | ICD-10-CM

## 2014-06-06 LAB — CBC WITH DIFFERENTIAL/PLATELET
BASOS PCT: 1 % (ref 0–1)
Basophils Absolute: 0.1 10*3/uL (ref 0.0–0.1)
EOS ABS: 0.3 10*3/uL (ref 0.0–0.7)
Eosinophils Relative: 3 % (ref 0–5)
HCT: 37.3 % (ref 36.0–46.0)
HEMOGLOBIN: 12.3 g/dL (ref 12.0–15.0)
Lymphocytes Relative: 35 % (ref 12–46)
Lymphs Abs: 3.3 10*3/uL (ref 0.7–4.0)
MCH: 27.6 pg (ref 26.0–34.0)
MCHC: 33 g/dL (ref 30.0–36.0)
MCV: 83.8 fL (ref 78.0–100.0)
MONOS PCT: 6 % (ref 3–12)
Monocytes Absolute: 0.6 10*3/uL (ref 0.1–1.0)
NEUTROS ABS: 5.2 10*3/uL (ref 1.7–7.7)
NEUTROS PCT: 55 % (ref 43–77)
PLATELETS: 360 10*3/uL (ref 150–400)
RBC: 4.45 MIL/uL (ref 3.87–5.11)
RDW: 14.7 % (ref 11.5–15.5)
WBC: 9.5 10*3/uL (ref 4.0–10.5)

## 2014-06-06 LAB — HEMOGLOBIN A1C
Hgb A1c MFr Bld: 7 % — ABNORMAL HIGH (ref <5.7)
Mean Plasma Glucose: 154 mg/dL — ABNORMAL HIGH (ref <117)

## 2014-06-06 MED ORDER — GLIPIZIDE 2.5 MG HALF TABLET: 2.5000 mg | ORAL_TABLET | Freq: Three times a day (TID) | ORAL | Status: DC

## 2014-06-06 MED ORDER — ZOLPIDEM TARTRATE 5 MG PO TABS: 5.0000 mg | ORAL_TABLET | Freq: Every evening | ORAL | Status: DC | PRN

## 2014-06-06 NOTE — Patient Instructions (Signed)
Bad carbs also include fruit juice, alcohol, and sweet tea. These are empty calories that do not signal to your brain that you are full.   Please remember the good carbs are still carbs which convert into sugar. So please measure them out no more than 1/2-1 cup of rice, oatmeal, pasta, and beans.  Veggies are however free foods! Pile them on.   I like lean protein at every meal such as chicken, [place], pork chops, cottage cheese, etc. Just do not fry these meats and please center your meal around vegetable, the meats should be a side dish.   No all fruit is created equal. Please see the list below, the fruit at the bottom is higher in sugars than the fruit at the top   Insomnia Insomnia is frequent trouble falling and/or staying asleep. Insomnia can be a long term problem or a short term problem. Both are common. Insomnia can be a short term problem when the wakefulness is related to a certain stress or worry. Long term insomnia is often related to ongoing stress during waking hours and/or poor sleeping habits. Overtime, sleep deprivation itself can make the problem worse. Every little thing feels more severe because you are overtired and your ability to cope is decreased. CAUSES   Stress, anxiety, and depression.  Poor sleeping habits.  Distractions such as TV in the bedroom.  Naps close to bedtime.  Engaging in emotionally charged conversations before bed.  Technical reading before sleep.  Alcohol and other sedatives. They may make the problem worse. They can hurt normal sleep patterns and normal dream activity.  Stimulants such as caffeine for several hours prior to bedtime.  Pain syndromes and shortness of breath can cause insomnia.  Exercise late at night.  Changing time zones may cause sleeping problems (jet lag). It is sometimes helpful to have someone observe your sleeping patterns. They should look for periods of not breathing during the night (sleep apnea). They should  also look to see how long those periods last. If you live alone or observers are uncertain, you can also be observed at a sleep clinic where your sleep patterns will be professionally monitored. Sleep apnea requires a checkup and treatment. Give your caregivers your medical history. Give your caregivers observations your family has made about your sleep.  SYMPTOMS   Not feeling rested in the morning.  Anxiety and restlessness at bedtime.  Difficulty falling and staying asleep. TREATMENT   Your caregiver may prescribe treatment for an underlying medical disorders. Your caregiver can give advice or help if you are using alcohol or other drugs for self-medication. Treatment of underlying problems will usually eliminate insomnia problems.  Medications can be prescribed for short time use. They are generally not recommended for lengthy use.  Over-the-counter sleep medicines are not recommended for lengthy use. They can be habit forming.  You can promote easier sleeping by making lifestyle changes such as:  Using relaxation techniques that help with breathing and reduce muscle tension.  Exercising earlier in the day.  Changing your diet and the time of your last meal. No night time snacks.  Establish a regular time to go to bed.  Counseling can help with stressful problems and worry.  Soothing music and white noise may be helpful if there are background noises you cannot remove.  Stop tedious detailed work at least one hour before bedtime. HOME CARE INSTRUCTIONS   Keep a diary. Inform your caregiver about your progress. This includes any medication side effects.  See your caregiver regularly. Take note of:  Times when you are asleep.  Times when you are awake during the night.  The quality of your sleep.  How you feel the next day. This information will help your caregiver care for you.  Get out of bed if you are still awake after 15 minutes. Read or do some quiet activity. Keep  the lights down. Wait until you feel sleepy and go back to bed.  Keep regular sleeping and waking hours. Avoid naps.  Exercise regularly.  Avoid distractions at bedtime. Distractions include watching television or engaging in any intense or detailed activity like attempting to balance the household checkbook.  Develop a bedtime ritual. Keep a familiar routine of bathing, brushing your teeth, climbing into bed at the same time each night, listening to soothing music. Routines increase the success of falling to sleep faster.  Use relaxation techniques. This can be using breathing and muscle tension release routines. It can also include visualizing peaceful scenes. You can also help control troubling or intruding thoughts by keeping your mind occupied with boring or repetitive thoughts like the old concept of counting sheep. You can make it more creative like imagining planting one beautiful flower after another in your backyard garden.  During your day, work to eliminate stress. When this is not possible use some of the previous suggestions to help reduce the anxiety that accompanies stressful situations. MAKE SURE YOU:   Understand these instructions.  Will watch your condition.  Will get help right away if you are not doing well or get worse. Document Released: 12/05/2000 Document Revised: 03/01/2012 Document Reviewed: 01/05/2008 [facility] Patient Information 2014 [place].

## 2014-06-06 NOTE — Progress Notes (Signed)
MEDICARE ANNUAL WELLNESS VISIT AND FOLLOW UP  Assessment:   1. Hypertension - CBC with Differential - BASIC METABOLIC PANEL WITH GFR - Hepatic function panel  2. OSA (obstructive sleep apnea) -does not wear mask, counseled on importance of wearing mask to decrease CHF, MI, stroke - will give ambien 5 for sleep, she has done this in the past and it has helped with no AE's, she only takes the valium before going to Dr. Zigmund Daniel.  - Has tried valium for sleep but did not help.   3. 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 - Hemoglobin A1c - Insulin, fasting - HM DIABETES FOOT EXAM  4. Hypothyroidism - TSH  5. Hyperlipidemia - Lipid panel  6. Obesity, morbid Obesity with co morbidities- long discussion about weight loss, diet, and exercise  7. Encounter for long-term (current) use of other medications - Magnesium  8. Vitamin D Deficiency - Vit D  25 hydroxy (rtn osteoporosis monitoring)   Plan:   During the course of the visit the patient was educated and counseled about appropriate screening and preventive services including:    Pneumococcal vaccine   Influenza vaccine  Td vaccine  Screening electrocardiogram  Screening mammography  Bone densitometry screening  Colorectal cancer screening  Diabetes screening  Glaucoma screening  Nutrition counseling   Advanced directives: given info/requested  Screening recommendations, referrals:  Vaccinations: Tdap vaccine declined Influenza vaccine will get in aug Pneumococcal vaccine not indicated Shingles vaccine declined Hep B vaccine not indicated  Nutrition assessed and recommended  Colonoscopy due in 10 years Mammogram due 10/2014 Pap smear not indicated Pelvic exam not  indicated Recommended yearly ophthalmology/optometry visit for glaucoma screening and checkup Recommended yearly dental visit for hygiene and checkup Advanced directives - given papers  Conditions/risks identified: BMI: Discussed weight loss, diet, and increase physical activity.  Increase physical activity: AHA recommends 150 minutes of physical activity a week.  Medications reviewed DEXA- declined Diabetes is at goal, ACE/ARB therapy: Yes. Urinary Incontinence is not an issue: discussed non pharmacology and pharmacology options.  Fall risk: low- discussed PT, home fall assessment, medications.    Subjective:   Jocelyn Camacho is a 68 y.o. female who presents for Medicare Annual Wellness Visit and 3 month follow up on hypertension, diabetes, hyperlipidemia, vitamin D def.  Date of last medicare wellness visit is unknown.   Her blood pressure has been controlled at home, today their BP is BP: 130/72 mmHg She does workout but not regularly. She denies chest pain, shortness of breath, dizziness.  She is on cholesterol medication and denies myalgias. Her cholesterol is at goal. The cholesterol last visit was:   Lab Results  Component Value Date   CHOL 132 02/22/2014   HDL 34* 02/22/2014   LDLCALC 64 02/22/2014   TRIG 172* 02/22/2014   CHOLHDL 3.9 02/22/2014   She has been working on diet and exercise for diabetes, and denies nausea, paresthesia of the feet and polydipsia. Last A1C in the office was:  Lab Results  Component Value Date   HGBA1C 6.4* 02/22/2014   Patient is on Vitamin D supplement. 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  She states that she does not sleep well at night but sleep a lot during the day. She was on ambien in the past and  has done well and she has been diagnosed with OSA but does not wear the mask.   Names of Other Physician/Practitioners you currently use: 1. [facility]- here for primary  care Patient Care Team: [name], MD as PCP - General (Internal Medicine) Josephina Gip, MD as Consulting Physician (Optometry) [name], MD as Consulting Physician (Ophthalmology) Inda Castle, MD as Consulting Physician (Gastroenterology) Simona Huh, MD as Consulting Physician (Dermatology) Michelene Gardener., MD (Dermatology)  Medication Review Current Outpatient Prescriptions on File Prior to Visit  Medication Sig Dispense Refill  . atenolol (TENORMIN) 100 MG tablet Take 50 mg by mouth daily.      Marland Kitchen azelastine (OPTIVAR) 0.05 % ophthalmic solution instill 1 drop into both eyes twice a day  6 mL  11  . b complex vitamins tablet Take 1 tablet by mouth daily.      Marland Kitchen BESIVANCE 0.6 % SUSP       . calcium carbonate (OS-CAL) 600 MG TABS Take 600 mg by mouth 2 (two) times daily with a meal.      . Cholecalciferol (VITAMIN D3) 2000 UNITS TABS Take 2,000 mcg by mouth 3 (three) times daily.      . diazepam (VALIUM) 5 MG tablet       . enalapril (VASOTEC) 20 MG tablet Take 20 mg by mouth daily.       . Flaxseed, Linseed, (FLAXSEED OIL PO) Take by mouth daily.       . furosemide (LASIX) 40 MG tablet Take 1 tablet (40 mg total) by mouth 2 (two) times daily. For BP and Fluid  90 tablet  99  . glipiZIDE (GLUCOTROL) 5 MG tablet 1/2 to 1 tab TID ac  90 tablet  99  . hyoscyamine (LEVSIN, ANASPAZ) 0.125 MG tablet Take 1 tablet (0.125 mg total) by mouth every 6 (six) hours as needed.  60 tablet  1  . KLOR-CON 10 10 MEQ tablet TAKE 1 TABLET BY MOUTH DAILY  30 tablet  3  . levothyroxine (SYNTHROID, LEVOTHROID) 100 MCG tablet Take 1 tablet (100 mcg total) by mouth daily before breakfast.  90 tablet  99  . metFORMIN (GLUCOPHAGE) 500 MG tablet Take 1,000 mg by mouth 2 (two) times daily with a meal.      . Multiple Vitamin (MULTIVITAMIN) tablet Take 1 tablet by mouth daily.      . naproxen (NAPROSYN) 250 MG tablet Take 250 mg by mouth as needed.      . Omega-3 Fatty Acids  (FISH OIL PO) Take 1 capsule by mouth daily.      . prednisoLONE acetate (PRED FORTE) 1 % ophthalmic suspension INSTILL ONE DROP INTO EACH EYE TWICE DAILY FOR  ALLERGIES  5 mL  0   No current facility-administered medications on file prior to visit.    Current Problems (verified) Patient Active Problem List   Diagnosis Date Noted  . Encounter for long-term (current) use of other medications 03/29/2014  . Hypertension 11/16/2013  . Hyperlipidemia 11/16/2013  . 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 Vitamin D Deficiency 11/16/2013  . Obesity, morbid 11/16/2013  . OSA (obstructive sleep apnea) 11/16/2013  . Hypothyroidism 11/16/2013    Screening Tests Health Maintenance  Topic Date Due  . Urine Microalbumin  03/27/1956  . Ophthalmology Exam  05/22/2014  . Pneumococcal Polysaccharide Vaccine Age 70 And Over  09/28/2014 (Originally 03/28/2011)  . Influenza Vaccine  07/22/2014  .  Mammogram  08/03/2014  . Hemoglobin A1c  08/25/2014  . Foot Exam  02/23/2015  . Tetanus/tdap  02/27/2024  . Colonoscopy  04/17/2024  . Zostavax  Addressed     Immunization History  Administered Date(s) Administered  . DTaP 12/09/2004  . Pneumococcal Polysaccharide-23 03/02/2009   Preventative care: Last colonoscopy: 03/2014 normal Last mammogram: 10/2013 due 10/2014 Last pap smear/pelvic exam: 2012 remote DEXA: Declines  Prior vaccinations: TD or Tdap: 2005 DUE declines for now  Influenza: will get in Aug Pneumococcal: 2010 Shingles/Zostavax: declines  History reviewed: allergies, current medications, past family history, past medical history, past social history, past surgical history and problem list  Risk Factors: Osteoporosis: postmenopausal estrogen deficiency and dietary calcium and/or vitamin D deficiency History of fracture in the past year: no  Tobacco History  Substance Use Topics  . Smoking status: Never Smoker   . Smokeless tobacco: Never Used  . Alcohol Use:  Yes     Comment: occasional   She does not smoke.  Patient is not a former smoker. Are there smokers in your home (other than you)?  No  Alcohol Current alcohol use: social drinker  Caffeine Current caffeine use: coffee 1 /day  Exercise Current exercise: none  Nutrition/Diet Current diet: in general, an "unhealthy" diet  Cardiac risk factors: advanced age (older than 42 for men, 62 for women), diabetes mellitus, dyslipidemia, hypertension, obesity (BMI >= 30 kg/m2) and sedentary lifestyle.  Depression Screen (Note: if answer to either of the following is "Yes", a more complete depression screening is indicated)   Q1: Over the past two weeks, have you felt down, depressed or hopeless? No  Q2: Over the past two weeks, have you felt little interest or pleasure in doing things? No  Have you lost interest or pleasure in daily life? No  Do you often feel hopeless? No  Do you cry easily over simple problems? No  Activities of Daily Living In your present state of health, do you have any difficulty performing the following activities?:  Driving? No Managing money?  No Feeding yourself? No Getting from bed to chair? No Climbing a flight of stairs? Yes Preparing food and eating?: No Bathing or showering? No Getting dressed: No Getting to the toilet? No Using the toilet:No Moving around from place to place: No In the past year have you fallen or had a near fall?:No   Are you sexually active?  No  Do you have more than one partner?  No  Vision Difficulties: Yes  Hearing Difficulties: No Do you often ask people to speak up or repeat themselves? No Do you experience ringing or noises in your ears? No Do you have difficulty understanding soft or whispered voices? No  Cognition  Do you feel that you have a problem with memory?No  Do you often misplace items? No  Do you feel safe at home?  Yes  Advanced directives Does patient have a [place]? No Does  patient have a Living Will? No   Objective:   Blood pressure 130/72, pulse 72, temperature 97.7 F (36.5 C), resp. rate 16, height 5\' 3"  (1.6 m), weight 250 lb (113.399 kg). Body mass index is 44.3 kg/(m^2).  General appearance: alert, no distress, WD/WN,  female Cognitive Testing  Alert? Yes  Normal Appearance?Yes  Oriented to person? Yes  Place? Yes   Time? Yes  Recall of three objects?  Yes  Can perform simple calculations? Yes  Displays appropriate judgment?Yes  Can read the correct  time from a watch face?Yes  HEENT: normocephalic, sclerae anicteric, TMs pearly, nares patent, no discharge or erythema, pharynx normal Oral cavity: MMM, no lesions Neck: supple, no lymphadenopathy, no thyromegaly, no masses Heart: RRR, normal S1, S2, no murmurs Lungs: CTA bilaterally, no wheezes, rhonchi, or rales Abdomen: +bs, soft, obese, non tender, non distended, no masses, no hepatomegaly, no splenomegaly Musculoskeletal: nontender, no swelling, no obvious deformity Extremities: trace bilateral edema, no cyanosis, no clubbing Pulses: 2+ symmetric, upper and lower extremities, normal cap refill Neurological: alert, oriented x 3, CN2-12 intact, strength normal upper extremities and lower extremities, sensation decreased bilateral feet to ankle DTRs 2+ throughout, no cerebellar signs, gait antalgic and wide based likely due to body habitus Psychiatric: normal affect, behavior normal, pleasant  Breast: defer Gyn: defer Rectal: defer  Medicare Attestation I have personally reviewed: The patient's medical and social history Their use of alcohol, tobacco or illicit drugs Their current medications and supplements The patient's functional ability including ADLs,fall risks, home safety risks, cognitive, and hearing and visual impairment Diet and physical activities Evidence for depression or mood disorders  The patient's weight, height, BMI, and visual acuity have been recorded in the chart.  I  have made referrals, counseling, and provided education to the patient based on review of the above and I have provided the patient with a written personalized care plan for preventive services.     Vicie Mutters, PA-C   06/06/2014

## 2014-06-07 ENCOUNTER — Other Ambulatory Visit: Payer: Self-pay

## 2014-06-07 DIAGNOSIS — E039 Hypothyroidism, unspecified: Secondary | ICD-10-CM

## 2014-06-07 LAB — BASIC METABOLIC PANEL WITH GFR
BUN: 19 mg/dL (ref 6–23)
CALCIUM: 9.9 mg/dL (ref 8.4–10.5)
CO2: 29 mEq/L (ref 19–32)
Chloride: 98 mEq/L (ref 96–112)
Creat: 0.89 mg/dL (ref 0.50–1.10)
GFR, EST AFRICAN AMERICAN: 77 mL/min
GFR, EST NON AFRICAN AMERICAN: 67 mL/min
GLUCOSE: 134 mg/dL — AB (ref 70–99)
POTASSIUM: 4.2 meq/L (ref 3.5–5.3)
Sodium: 139 mEq/L (ref 135–145)

## 2014-06-07 LAB — MAGNESIUM: Magnesium: 1.7 mg/dL (ref 1.5–2.5)

## 2014-06-07 LAB — LIPID PANEL
Cholesterol: 157 mg/dL (ref 0–200)
HDL: 31 mg/dL — AB (ref >39)
LDL Cholesterol: 71 mg/dL (ref 0–99)
TRIGLYCERIDES: 276 mg/dL — AB (ref <150)
Total CHOL/HDL Ratio: 5.1 Ratio
VLDL: 55 mg/dL — AB (ref 0–40)

## 2014-06-07 LAB — HEPATIC FUNCTION PANEL
ALT: 21 U/L (ref 0–35)
AST: 19 U/L (ref 0–37)
Albumin: 4.2 g/dL (ref 3.5–5.2)
Alkaline Phosphatase: 85 U/L (ref 39–117)
BILIRUBIN DIRECT: 0.1 mg/dL (ref 0.0–0.3)
BILIRUBIN INDIRECT: 0.5 mg/dL (ref 0.2–1.2)
Total Bilirubin: 0.6 mg/dL (ref 0.2–1.2)
Total Protein: 7.5 g/dL (ref 6.0–8.3)

## 2014-06-07 LAB — TSH: TSH: 7.276 u[IU]/mL — ABNORMAL HIGH (ref 0.350–4.500)

## 2014-06-07 LAB — VITAMIN D 25 HYDROXY (VIT D DEFICIENCY, FRACTURES): Vit D, 25-Hydroxy: 63 ng/mL (ref 30–89)

## 2014-06-07 LAB — INSULIN, FASTING: INSULIN FASTING, SERUM: 49 u[IU]/mL — AB (ref 3–28)

## 2014-06-07 MED ORDER — LEVOTHYROXINE SODIUM 100 MCG PO TABS: 100.0000 ug | ORAL_TABLET | Freq: Every day | ORAL | Status: DC

## 2014-06-12 ENCOUNTER — Other Ambulatory Visit: Payer: Self-pay | Admitting: Physician Assistant

## 2014-06-30 ENCOUNTER — Encounter (INDEPENDENT_AMBULATORY_CARE_PROVIDER_SITE_OTHER): Payer: Medicare Other | Admitting: Ophthalmology

## 2014-06-30 DIAGNOSIS — H43819 Vitreous degeneration, unspecified eye: Secondary | ICD-10-CM

## 2014-06-30 DIAGNOSIS — I10 Essential (primary) hypertension: Secondary | ICD-10-CM

## 2014-06-30 DIAGNOSIS — H348192 Central retinal vein occlusion, unspecified eye, stable: Secondary | ICD-10-CM

## 2014-06-30 DIAGNOSIS — H35039 Hypertensive retinopathy, unspecified eye: Secondary | ICD-10-CM

## 2014-06-30 DIAGNOSIS — H348392 Tributary (branch) retinal vein occlusion, unspecified eye, stable: Secondary | ICD-10-CM

## 2014-07-11 ENCOUNTER — Ambulatory Visit (INDEPENDENT_AMBULATORY_CARE_PROVIDER_SITE_OTHER): Payer: Medicare Other | Admitting: *Deleted

## 2014-07-11 DIAGNOSIS — E039 Hypothyroidism, unspecified: Secondary | ICD-10-CM

## 2014-07-11 NOTE — Progress Notes (Signed)
Patient ID: Jocelyn Camacho, female   DOB: [birth year], 68 y.o.   MRN: [id number] Patient presents for recheck TSH level.  Patient states she is taking 100 mcg daily and 150 mcg on Tuesdays and Thursdays.

## 2014-07-12 LAB — TSH: TSH: 4.148 u[IU]/mL (ref 0.350–4.500)

## 2014-07-28 ENCOUNTER — Encounter (INDEPENDENT_AMBULATORY_CARE_PROVIDER_SITE_OTHER): Payer: Medicare Other | Admitting: Ophthalmology

## 2014-07-28 DIAGNOSIS — H348392 Tributary (branch) retinal vein occlusion, unspecified eye, stable: Secondary | ICD-10-CM

## 2014-07-28 DIAGNOSIS — H348192 Central retinal vein occlusion, unspecified eye, stable: Secondary | ICD-10-CM

## 2014-07-28 DIAGNOSIS — H43819 Vitreous degeneration, unspecified eye: Secondary | ICD-10-CM

## 2014-07-28 DIAGNOSIS — H2510 Age-related nuclear cataract, unspecified eye: Secondary | ICD-10-CM

## 2014-07-28 DIAGNOSIS — H35039 Hypertensive retinopathy, unspecified eye: Secondary | ICD-10-CM

## 2014-07-28 DIAGNOSIS — I10 Essential (primary) hypertension: Secondary | ICD-10-CM

## 2014-08-23 ENCOUNTER — Other Ambulatory Visit: Payer: Self-pay | Admitting: Internal Medicine

## 2014-09-04 ENCOUNTER — Other Ambulatory Visit: Payer: Self-pay | Admitting: *Deleted

## 2014-09-04 MED ORDER — NAPROXEN 500 MG PO TABS: 500.0000 mg | ORAL_TABLET | Freq: Two times a day (BID) | ORAL | Status: DC

## 2014-09-05 ENCOUNTER — Ambulatory Visit: Payer: Self-pay | Admitting: Emergency Medicine

## 2014-09-14 ENCOUNTER — Encounter: Payer: Self-pay | Admitting: Physician Assistant

## 2014-09-18 ENCOUNTER — Encounter (INDEPENDENT_AMBULATORY_CARE_PROVIDER_SITE_OTHER): Payer: Medicare Other | Admitting: Ophthalmology

## 2014-09-18 DIAGNOSIS — H348392 Tributary (branch) retinal vein occlusion, unspecified eye, stable: Secondary | ICD-10-CM

## 2014-09-18 DIAGNOSIS — H43819 Vitreous degeneration, unspecified eye: Secondary | ICD-10-CM

## 2014-09-18 DIAGNOSIS — I10 Essential (primary) hypertension: Secondary | ICD-10-CM

## 2014-09-18 DIAGNOSIS — H35039 Hypertensive retinopathy, unspecified eye: Secondary | ICD-10-CM

## 2014-09-18 DIAGNOSIS — H2510 Age-related nuclear cataract, unspecified eye: Secondary | ICD-10-CM

## 2014-09-18 DIAGNOSIS — H348192 Central retinal vein occlusion, unspecified eye, stable: Secondary | ICD-10-CM

## 2014-10-06 ENCOUNTER — Other Ambulatory Visit: Payer: Self-pay

## 2014-10-30 ENCOUNTER — Other Ambulatory Visit: Payer: Self-pay | Admitting: *Deleted

## 2014-10-30 MED ORDER — METFORMIN HCL 500 MG PO TABS: 1000.0000 mg | ORAL_TABLET | Freq: Two times a day (BID) | ORAL | Status: DC

## 2014-11-01 ENCOUNTER — Other Ambulatory Visit: Payer: Self-pay | Admitting: Physician Assistant

## 2014-11-08 ENCOUNTER — Other Ambulatory Visit: Payer: Self-pay | Admitting: Emergency Medicine

## 2014-11-08 NOTE — Telephone Encounter (Signed)
Patient needs OV before refills

## 2014-11-13 ENCOUNTER — Encounter (INDEPENDENT_AMBULATORY_CARE_PROVIDER_SITE_OTHER): Payer: Medicare Other | Admitting: Ophthalmology

## 2014-11-13 DIAGNOSIS — H43813 Vitreous degeneration, bilateral: Secondary | ICD-10-CM

## 2014-11-13 DIAGNOSIS — I10 Essential (primary) hypertension: Secondary | ICD-10-CM

## 2014-11-13 DIAGNOSIS — H34812 Central retinal vein occlusion, left eye: Secondary | ICD-10-CM

## 2014-11-13 DIAGNOSIS — H2513 Age-related nuclear cataract, bilateral: Secondary | ICD-10-CM

## 2014-11-13 DIAGNOSIS — H35033 Hypertensive retinopathy, bilateral: Secondary | ICD-10-CM

## 2014-11-20 ENCOUNTER — Other Ambulatory Visit: Payer: Self-pay | Admitting: Internal Medicine

## 2014-11-20 DIAGNOSIS — Z1231 Encounter for screening mammogram for malignant neoplasm of breast: Secondary | ICD-10-CM

## 2014-11-21 ENCOUNTER — Ambulatory Visit (INDEPENDENT_AMBULATORY_CARE_PROVIDER_SITE_OTHER): Payer: Medicare Other | Admitting: Physician Assistant

## 2014-11-21 ENCOUNTER — Encounter: Payer: Self-pay | Admitting: Physician Assistant

## 2014-11-21 ENCOUNTER — Other Ambulatory Visit: Payer: Self-pay | Admitting: Physician Assistant

## 2014-11-21 VITALS — BP 138/82 | HR 76 | Temp 98.1°F | Resp 16 | Ht 63.0 in | Wt 249.0 lb

## 2014-11-21 DIAGNOSIS — E782 Mixed hyperlipidemia: Secondary | ICD-10-CM

## 2014-11-21 DIAGNOSIS — R6889 Other general symptoms and signs: Secondary | ICD-10-CM

## 2014-11-21 DIAGNOSIS — N182 Chronic kidney disease, stage 2 (mild): Secondary | ICD-10-CM

## 2014-11-21 DIAGNOSIS — E1122 Type 2 diabetes mellitus with diabetic chronic kidney disease: Secondary | ICD-10-CM

## 2014-11-21 DIAGNOSIS — Z79899 Other long term (current) drug therapy: Secondary | ICD-10-CM

## 2014-11-21 DIAGNOSIS — E559 Vitamin D deficiency, unspecified: Secondary | ICD-10-CM

## 2014-11-21 DIAGNOSIS — E039 Hypothyroidism, unspecified: Secondary | ICD-10-CM

## 2014-11-21 DIAGNOSIS — Z0001 Encounter for general adult medical examination with abnormal findings: Secondary | ICD-10-CM

## 2014-11-21 DIAGNOSIS — I10 Essential (primary) hypertension: Secondary | ICD-10-CM

## 2014-11-21 DIAGNOSIS — G4733 Obstructive sleep apnea (adult) (pediatric): Secondary | ICD-10-CM

## 2014-11-21 LAB — CBC WITH DIFFERENTIAL/PLATELET
Basophils Absolute: 0.1 10*3/uL (ref 0.0–0.1)
Basophils Relative: 1 % (ref 0–1)
EOS PCT: 3 % (ref 0–5)
Eosinophils Absolute: 0.2 10*3/uL (ref 0.0–0.7)
HCT: 36.9 % (ref 36.0–46.0)
Hemoglobin: 12.6 g/dL (ref 12.0–15.0)
LYMPHS ABS: 3 10*3/uL (ref 0.7–4.0)
Lymphocytes Relative: 37 % (ref 12–46)
MCH: 27.3 pg (ref 26.0–34.0)
MCHC: 34.1 g/dL (ref 30.0–36.0)
MCV: 80 fL (ref 78.0–100.0)
MPV: 10.8 fL (ref 9.4–12.4)
Monocytes Absolute: 0.4 10*3/uL (ref 0.1–1.0)
Monocytes Relative: 5 % (ref 3–12)
NEUTROS PCT: 54 % (ref 43–77)
Neutro Abs: 4.4 10*3/uL (ref 1.7–7.7)
Platelets: 384 10*3/uL (ref 150–400)
RBC: 4.61 MIL/uL (ref 3.87–5.11)
RDW: 14.5 % (ref 11.5–15.5)
WBC: 8.1 10*3/uL (ref 4.0–10.5)

## 2014-11-21 LAB — HEMOGLOBIN A1C
Hgb A1c MFr Bld: 7 % — ABNORMAL HIGH (ref <5.7)
Mean Plasma Glucose: 154 mg/dL — ABNORMAL HIGH (ref <117)

## 2014-11-21 MED ORDER — AZELASTINE HCL 0.15 % NA SOLN: NASAL | Status: DC

## 2014-11-21 NOTE — Patient Instructions (Addendum)
Can try melatonin 5mg -15 mg at night for sleep, can also do benadryl 25-50mg  at night for sleep.  If this does not help we can try prescription medication like 1/2-1 of the ambien 5 mg.  Also here is some information about good sleep hygiene.   Insomnia Insomnia is frequent trouble falling and/or staying asleep. Insomnia can be a long term problem or a short term problem. Both are common. Insomnia can be a short term problem when the wakefulness is related to a certain stress or worry. Long term insomnia is often related to ongoing stress during waking hours and/or poor sleeping habits. Overtime, sleep deprivation itself can make the problem worse. Every little thing feels more severe because you are overtired and your ability to cope is decreased. CAUSES  1. Stress, anxiety, and depression. 2. Poor sleeping habits. 3. Distractions such as TV in the bedroom. 4. Naps close to bedtime. 5. Engaging in emotionally charged conversations before bed. 6. Technical reading before sleep. 7. Alcohol and other sedatives. They may make the problem worse. They can hurt normal sleep patterns and normal dream activity. 8. Stimulants such as caffeine for several hours prior to bedtime. 9. Pain syndromes and shortness of breath can cause insomnia. 10. Exercise late at night. 11. Changing time zones may cause sleeping problems (jet lag). It is sometimes helpful to have someone observe your sleeping patterns. They should look for periods of not breathing during the night (sleep apnea). They should also look to see how long those periods last. If you live alone or observers are uncertain, you can also be observed at a sleep clinic where your sleep patterns will be professionally monitored. Sleep apnea requires a checkup and treatment. Give your caregivers your medical history. Give your caregivers observations your family has made about your sleep.  SYMPTOMS   Not feeling rested in the morning.  Anxiety and  restlessness at bedtime.  Difficulty falling and staying asleep. TREATMENT   Your caregiver may prescribe treatment for an underlying medical disorders. Your caregiver can give advice or help if you are using alcohol or other drugs for self-medication. Treatment of underlying problems will usually eliminate insomnia problems.  Medications can be prescribed for short time use. They are generally not recommended for lengthy use.  Over-the-counter sleep medicines are not recommended for lengthy use. They can be habit forming.  You can promote easier sleeping by making lifestyle changes such as:  Using relaxation techniques that help with breathing and reduce muscle tension.  Exercising earlier in the day.  Changing your diet and the time of your last meal. No night time snacks.  Establish a regular time to go to bed.  Counseling can help with stressful problems and worry.  Soothing music and white noise may be helpful if there are background noises you cannot remove.  Stop tedious detailed work at least one hour before bedtime. HOME CARE INSTRUCTIONS   Keep a diary. Inform your caregiver about your progress. This includes any medication side effects. See your caregiver regularly. Take note of:  Times when you are asleep.  Times when you are awake during the night.  The quality of your sleep.  How you feel the next day. This information will help your caregiver care for you.  Get out of bed if you are still awake after 15 minutes. Read or do some quiet activity. Keep the lights down. Wait until you feel sleepy and go back to bed.  Keep regular sleeping and waking hours. Avoid  naps.  Exercise regularly.  Avoid distractions at bedtime. Distractions include watching television or engaging in any intense or detailed activity like attempting to balance the household checkbook.  Develop a bedtime ritual. Keep a familiar routine of bathing, brushing your teeth, climbing into bed  at the same time each night, listening to soothing music. Routines increase the success of falling to sleep faster.  Use relaxation techniques. This can be using breathing and muscle tension release routines. It can also include visualizing peaceful scenes. You can also help control troubling or intruding thoughts by keeping your mind occupied with boring or repetitive thoughts like the old concept of counting sheep. You can make it more creative like imagining planting one beautiful flower after another in your backyard garden.  During your day, work to eliminate stress. When this is not possible use some of the previous suggestions to help reduce the anxiety that accompanies stressful situations. MAKE SURE YOU:   Understand these instructions.  Will watch your condition.  Will get help right away if you are not doing well or get worse. Document Released: 12/05/2000 Document Revised: 03/01/2012 Document Reviewed: 01/05/2008 [facility] Patient Information 2015 [place], [place]. This information is not intended to replace advice given to you by your health care provider. Make sure you discuss any questions you have with your health care provider.     Bad carbs also include fruit juice, alcohol, and sweet tea. These are empty calories that do not signal to your brain that you are full.   Please remember the good carbs are still carbs which convert into sugar. So please measure them out no more than 1/2-1 cup of rice, oatmeal, pasta, and beans  Veggies are however free foods! Pile them on.   Not all fruit is created equal. Please see the list below, the fruit at the bottom is higher in sugars than the fruit at the top. Please avoid all dried fruits.   We want weight loss that will last so you should lose 1-2 pounds a week.  THAT IS IT! Please pick THREE things a month to change. Once it is a habit check off the item. Then pick another three items off the list to become habits.  If you are already  doing a habit on the list GREAT!  Cross that item off! o Don't drink your calories. Ie, alcohol, soda, fruit juice, and sweet tea.  o Drink more water. Drink a glass when you feel hungry or before each meal.  o Eat breakfast - Complex carb and protein (likeDannon light and fit yogurt, oatmeal, fruit, eggs, [place] bacon). o Measure your cereal.  Eat no more than one cup a day. (ie [place]) o Eat an apple a day. o Add a vegetable a day. o Try a new vegetable a month. o Use Pam! Stop using oil or butter to cook. o Don't finish your plate or use smaller plates. o Share your dessert. o Eat sugar free Jello for dessert or frozen grapes. o Don't eat 2-3 hours before bed. o Switch to whole wheat bread, pasta, and brown rice. o Make healthier choices when you eat out. No fries! o Pick baked chicken, NOT fried. o Don't forget to SLOW DOWN when you eat. It is not going anywhere.  o Take the stairs. o Park far away in the parking lot o [employer] (or weights) for 10 minutes while watching TV. o Walk at work for 10 minutes during break. o Walk outside 1 time a  week with your friend, kids, dog, or significant other. o Start a walking group at [place] as much as you can tolerate.  o Keep a food diary. o Weigh yourself daily. o Walk for 15 minutes 3 days per week. o Cook at home more often and eat out less.  If life happens and you go back to old habits, it is okay.  Just start over. You can do it!   If you experience chest pain, get short of breath, or tired during the exercise, please stop immediately and inform your doctor.

## 2014-11-21 NOTE — Progress Notes (Signed)
Complete Physical  Assessment and Plan: 1. Essential hypertension - continue medications, DASH diet, exercise and monitor at home. Call if greater than 130/80.  - CBC with Differential - BASIC METABOLIC PANEL WITH GFR - Hepatic function panel - Urinalysis, Routine w reflex microscopic - Microalbumin / creatinine urine ratio - EKG 12-Lead - [place], RETROPERITNL ABD,  LTD  2. OSA (obstructive sleep apnea) Weight loss advised, needs to wear nightly, may benefit from new mask/titration  3. Hypothyroidism, unspecified hypothyroidism type Hypothyroidism-check TSH level, continue medications the same, reminded to take on an empty stomach 30-65mins before food.  - TSH  4. Hyperlipidemia -continue medications, check lipids, decrease fatty foods, increase activity.  - Lipid panel  5. Vitamin D deficiency - Vit D  25 hydroxy (rtn osteoporosis monitoring)  6. Obesity, morbid Obesity with co morbidities- long discussion about weight loss, diet, and exercise  7. Medication management - Magnesium  8. CKD stage 2 due to type 2 diabetes mellitus Discussed general issues about diabetes pathophysiology and management., Educational material distributed., Suggested low cholesterol diet., Encouraged aerobic exercise., Discussed foot care., Reminded to get yearly retinal exam. - Hemoglobin A1c - Insulin, fasting - HM DIABETES FOOT EXAM  Needs Tetanus and Prevnar 13 but declines due to social engagement.   Discussed med's effects and SE's. Screening labs and tests as requested with regular follow-up as recommended.  HPI  68 y.o. female  presents for a complete physical.  She has had elevated blood pressure since . Her blood pressure has been controlled at home, today their BP is BP: 138/82 mmHg She does workout but no regularly. She denies chest pain, shortness of breath, dizziness.  She is on cholesterol medication and denies myalgias. Her cholesterol is at goal. The cholesterol last visit was:    Lab Results  Component Value Date   CHOL 157 06/06/2014   HDL 31* 06/06/2014   LDLCALC 71 06/06/2014   TRIG 276* 06/06/2014   CHOLHDL 5.1 06/06/2014   She has had diabetes for 3 years. She has been working on diet and exercise for Diabetes with CKD stage II, and denies paresthesia of the feet, polydipsia and polyuria. Last A1C in the office was:  Lab Results  Component Value Date   HGBA1C 7.0* 06/06/2014   Patient is on Vitamin D supplement.   Lab Results  Component Value Date   VD25OH 63 06/06/2014     She is on thyroid medication. Her medication was changed last visit. She is on 140mcg daily except 1.5 pills on Tuesday and Thursday and takes it on an empty stomach. Patient denies feeling cold and cold intolerance, constipation, palpitations, weight loss, diarrhea  Lab Results  Component Value Date   TSH 4.148 07/11/2014  .  BMI is Body mass index is 44.12 kg/(m^2)., she is working on diet and exercise. She has OSA and does CPAP occ but not every night, she got the mask several years ago and may benefit from a re-titration. She also complains of excessive worry at night, increasing stress. She takes the ambien PRN for sleep and it does help when she takes it.  Wt Readings from Last 3 Encounters:  11/21/14 249 lb (112.946 kg)  06/06/14 250 lb (113.399 kg)  04/17/14 246 lb (111.585 kg)   Current Medications:  Current Outpatient Prescriptions on File Prior to Visit  Medication Sig Dispense Refill  . atenolol (TENORMIN) 100 MG tablet Take 50 mg by mouth daily.    Marland Kitchen azelastine (OPTIVAR) 0.05 % ophthalmic solution  instill 1 drop into both eyes twice a day 6 mL 11  . b complex vitamins tablet Take 1 tablet by mouth daily.    Marland Kitchen BESIVANCE 0.6 % SUSP     . calcium carbonate (OS-CAL) 600 MG TABS Take 600 mg by mouth 2 (two) times daily with a meal.    . Cholecalciferol (VITAMIN D3) 2000 UNITS TABS Take 2,000 mcg by mouth 3 (three) times daily.    . diazepam (VALIUM) 5 MG tablet     .  enalapril (VASOTEC) 20 MG tablet TAKE ONE TABLET BY MOUTH EVERY DAY FOR BLOOD PRESSURE 90 tablet 0  . Flaxseed, Linseed, (FLAXSEED OIL PO) Take by mouth daily.     . furosemide (LASIX) 40 MG tablet Take 1 tablet (40 mg total) by mouth 2 (two) times daily. For BP and Fluid 90 tablet 99  . glipiZIDE (GLUCOTROL) 2.5 mg TABS tablet Take 0.5 tablets (2.5 mg total) by mouth 3 (three) times daily with meals. 90 tablet 3  . hyoscyamine (LEVSIN, ANASPAZ) 0.125 MG tablet TAKE ONE TABLET BY MOUTH EVERY 6 HOURS AS NEEDED 60 tablet 0  . KLOR-CON 10 10 MEQ tablet TAKE 1 TABLET BY MOUTH DAILY 30 tablet 3  . levothyroxine (SYNTHROID, LEVOTHROID) 100 MCG tablet Take 1 tablet (100 mcg total) by mouth daily before breakfast. 90 tablet 3  . metFORMIN (GLUCOPHAGE) 500 MG tablet Take 2 tablets (1,000 mg total) by mouth 2 (two) times daily with a meal. 120 tablet 6  . Multiple Vitamin (MULTIVITAMIN) tablet Take 1 tablet by mouth daily.    . naproxen (NAPROSYN) 500 MG tablet Take 1 tablet (500 mg total) by mouth 2 (two) times daily with a meal. 60 tablet 2  . Omega-3 Fatty Acids (FISH OIL PO) Take 1 capsule by mouth daily.    . prednisoLONE acetate (PRED FORTE) 1 % ophthalmic suspension INSTILL ONE DROP INTO EACH EYE TWICE DAILY FOR  ALLERGIES 5 mL 0  . zolpidem (AMBIEN) 5 MG tablet Take 1 tablet (5 mg total) by mouth at bedtime as needed for sleep. 30 tablet 2   No current facility-administered medications on file prior to visit.   Health Maintenance:   Immunization History  Administered Date(s) Administered  . DTaP 12/09/2004  . Pneumococcal Polysaccharide-23 03/02/2009   Last colonoscopy: 03/2014 normal Last mammogram: 10/2013 scheduled for the 9th Last pap smear/pelvic exam: 2012 remote DEXA: Declines Carotid US 2013  Prior vaccinations: TD or Tdap: 2005 DUE declines for now Influenza: declines Pneumococcal: 2010 Prevnar 13: DUE Shingles/Zostavax: declines  Patient Care Team: [name],  MD as PCP - General (Internal Medicine) Josephina Gip, MD as Consulting Physician (Optometry) [name], MD as Consulting Physician (Ophthalmology)- getting injections for Mac Degen Inda Castle, MD as Consulting Physician (Gastroenterology) Simona Huh, MD as Consulting Physician (Dermatology) Michelene Gardener., MD (Dermatology)  Allergies:  Allergies  Allergen Reactions  . Penicillins Swelling   Medical History:  Past Medical History  Diagnosis Date  . Hypertension   . Hyperlipidemia   . Obesity   . OSA (obstructive sleep apnea)     wears CPAP  . Vitamin D deficiency   . Allergic rhinitis   . Unspecified hypothyroidism   . Type II or unspecified type diabetes mellitus with renal manifestations, not stated as uncontrolled   . Allergy   . Anxiety     with medical procedures   Surgical History:  Past Surgical History  Procedure Laterality Date  .  eye procedure      have treatment where a needle is stuck into left eye- since June 2014; now every six weeks  . Tubal ligation    . Dental examination under anesthesia     Family History:  Family History  Problem Relation Age of Onset  . Breast cancer Mother   . Renal Disease Mother   . Hypertension Sister   . Diabetes Sister   . Colon cancer Neg Hx   . Esophageal cancer Neg Hx   . Stomach cancer Neg Hx   . Rectal cancer Neg Hx    Social History:  History  Substance Use Topics  . Smoking status: Never Smoker   . Smokeless tobacco: Never Used  . Alcohol Use: Yes     Comment: occasional     Review of Systems: [X]  = complains of  [ ]  = denies  General: Fatigue [ ]  Fever [ ]  Chills [ ]  Weakness [ ]   Insomnia [ ] Weight change [ ]  Night sweats [ ]   Change in appetite [ ]  Head: Head Trauma [ ]  Eyes: Wears glasses or corrective lens [ ]  Redness [ ]  Blurred vision [ ]  Diplopia [ ]  Discharge [ ]  Floaters [ ]  [id number] [ ]  hearing loss [ ]  Tinnitus [ ]  Ear Discharge [ ]   Congestion [ ]   Sinus Pain [ ]  Post Nasal Drip [ ]  Nose Bleeds [ ]  Rhinorrhea [ ]    Difficulty Swallowing [ ]  Snoring [ ]  Sore Throat [ ]  Cardiac:   Chest pain/pressure [ ]  SOB [ ]  Orthopnea [ ]   Palpitations [ ]   Paroxysmal nocturnal dyspnea[ ]  Claudication [ ]  Edema [ ]  Difficulty walking around block or climbing stairs [ ]  Pulmonary: Cough [ ]  Wheezing[ ]   SOB [ ]   Pleurisy [ ]  Asthma [ ]  GI: Nausea [ ]  Vomiting[ ]  Dysphagia[ ]  Heartburn[ ]  Abdominal pain [ ]  Constipation [ ] ; Diarrhea [ ]  BRBPR [ ]  Melena[ ]  Bloating [ ]  Hemorrhoids [ ]  Incontinence [ ]  GU: Hematuria[ ]  Dysuria [ ]  Nocturia[ ]  Urgency [ ]   Hesitancy [ ]  Discharge [ ]  Frequency [ ]  Incontinence [ ]  Breast:  Dimpling [ ]  Breast lumps [ ]   Breast Lesions [ ]  Nipple discharge [ ]    Neuro: Headaches[ ]  Vertigo[ ]  Paresthesias[ ]  Spasm [ ]  Speech changes [ ]  Incoordination [ ]  Dizziness [ ]  Numbness [ ]  Ortho: Arthritis [ ]  Joint pain [ ]  Muscle pain [ ]  Joint swelling [ ]  Back Pain [ ]  Weakness [ ]  Stiffness [ ]  Skin:  Rash [ ]   Pruritis [ ]  Change in skin lesion [ ]  Change in hair [ ]  Change in nails [ ]  Psych: Depression[ ]  Anxiety[ ]  Stress [ ]  Confusion [ ]  Memory loss [ ]   Heme/Lymph: Bleeding [ ]  Bruising [ ]  History of anemia [ ]  Enlarged lymph nodes [ ]   Endocrine: Visual blurring [ ]  Paresthesia [ ]  Polyuria [ ]  Polydipsia [ ]  Polyphagia [ ]   Heat/cold intolerance [ ]  Hypoglycemia [ ]  Thyroid Issues [ ]  Diabetes [ ]   Physical Exam: Estimated body mass index is 44.12 kg/(m^2) as calculated from the following:   Height as of this encounter: 5\' 3"  (1.6 m).   Weight as of this encounter: 249 lb (112.946 kg). BP 138/82 mmHg  Pulse 76  Temp(Src) 98.1 F (36.7 C)  Resp 16  Ht 5\' 3"  (1.6 m)  Wt 249 lb (112.946 kg)  BMI 44.12 kg/m2 General  Appearance: Well nourished, in no apparent distress.  Eyes: PERRLA, EOMs, conjunctiva no swelling or erythema, normal fundi and vessels.  Sinuses: No Frontal/maxillary tenderness  ENT/Mouth: Ext aud  canals clear, normal light reflex with TMs without erythema, bulging. Good dentition. No erythema, swelling, or exudate on post pharynx. Tonsils not swollen or erythematous. Hearing normal.  Neck: Supple, thyroid normal. No bruits  Respiratory: Respiratory effort normal, BS equal bilaterally without rales, rhonchi, wheezing or stridor.  Cardio: RRR without murmurs, rubs or gallops. Brisk peripheral pulses without edema.  Chest: symmetric, with normal excursions and percussion.  Breasts: declines, states getting MGM Abdomen: Soft, nontender, no guarding, rebound, hernias, masses, or organomegaly. .  Lymphatics: Non tender without lymphadenopathy.  Genitourinary: defer Musculoskeletal: Full ROM all peripheral extremities,5/5 strength, and normal gait.  Skin: Warm, dry without rashes, lesions, ecchymosis. Neuro: Cranial nerves intact, reflexes equal bilaterally. Normal muscle tone, no cerebellar symptoms. Sensation intact.  Psych: Awake and oriented X 3, normal affect, Insight and Judgment appropriate.   EKG: WNL no changes. AORTA SCAN: WNL    Vicie Mutters 9:24 AM [facility]

## 2014-11-22 LAB — BASIC METABOLIC PANEL WITH GFR
BUN: 17 mg/dL (ref 6–23)
CO2: 28 meq/L (ref 19–32)
Calcium: 9.6 mg/dL (ref 8.4–10.5)
Chloride: 101 mEq/L (ref 96–112)
Creat: 0.85 mg/dL (ref 0.50–1.10)
GFR, Est African American: 81 mL/min
GFR, Est Non African American: 71 mL/min
Glucose, Bld: 166 mg/dL — ABNORMAL HIGH (ref 70–99)
POTASSIUM: 4.1 meq/L (ref 3.5–5.3)
SODIUM: 139 meq/L (ref 135–145)

## 2014-11-22 LAB — LIPID PANEL
CHOL/HDL RATIO: 4.7 ratio
Cholesterol: 168 mg/dL (ref 0–200)
HDL: 36 mg/dL — AB (ref >39)
LDL Cholesterol: 83 mg/dL (ref 0–99)
Triglycerides: 244 mg/dL — ABNORMAL HIGH (ref <150)
VLDL: 49 mg/dL — AB (ref 0–40)

## 2014-11-22 LAB — HEPATIC FUNCTION PANEL
ALK PHOS: 92 U/L (ref 39–117)
ALT: 15 U/L (ref 0–35)
AST: 17 U/L (ref 0–37)
Albumin: 4.2 g/dL (ref 3.5–5.2)
BILIRUBIN DIRECT: 0.1 mg/dL (ref 0.0–0.3)
BILIRUBIN INDIRECT: 0.5 mg/dL (ref 0.2–1.2)
BILIRUBIN TOTAL: 0.6 mg/dL (ref 0.2–1.2)
Total Protein: 7.4 g/dL (ref 6.0–8.3)

## 2014-11-22 LAB — TSH: TSH: 2.644 u[IU]/mL (ref 0.350–4.500)

## 2014-11-22 LAB — URINALYSIS, ROUTINE W REFLEX MICROSCOPIC
Bilirubin Urine: NEGATIVE
GLUCOSE, UA: NEGATIVE mg/dL
HGB URINE DIPSTICK: NEGATIVE
Ketones, ur: NEGATIVE mg/dL
NITRITE: NEGATIVE
PH: 5.5 (ref 5.0–8.0)
Protein, ur: NEGATIVE mg/dL
SPECIFIC GRAVITY, URINE: 1.007 (ref 1.005–1.030)
Urobilinogen, UA: 0.2 mg/dL (ref 0.0–1.0)

## 2014-11-22 LAB — URINALYSIS, MICROSCOPIC ONLY
Bacteria, UA: NONE SEEN
CASTS: NONE SEEN
CRYSTALS: NONE SEEN
SQUAMOUS EPITHELIAL / LPF: NONE SEEN

## 2014-11-22 LAB — MAGNESIUM: MAGNESIUM: 1.7 mg/dL (ref 1.5–2.5)

## 2014-11-22 LAB — MICROALBUMIN / CREATININE URINE RATIO
Creatinine, Urine: 52.5 mg/dL
MICROALB/CREAT RATIO: 17.1 mg/g (ref 0.0–30.0)
Microalb, Ur: 0.9 mg/dL (ref <2.0)

## 2014-11-22 LAB — VITAMIN D 25 HYDROXY (VIT D DEFICIENCY, FRACTURES): Vit D, 25-Hydroxy: 33 ng/mL (ref 30–100)

## 2014-11-22 LAB — INSULIN, FASTING: Insulin fasting, serum: 17.1 u[IU]/mL (ref 2.0–19.6)

## 2014-11-29 ENCOUNTER — Ambulatory Visit (HOSPITAL_COMMUNITY): Payer: Medicare Other

## 2014-11-30 ENCOUNTER — Ambulatory Visit (HOSPITAL_COMMUNITY)
Admission: RE | Admit: 2014-11-30 | Discharge: 2014-11-30 | Disposition: A | Discharge status: Home / Self Care | Payer: Medicare Other | Source: Ambulatory Visit | Attending: Internal Medicine | Admitting: Internal Medicine

## 2014-11-30 DIAGNOSIS — Z1231 Encounter for screening mammogram for malignant neoplasm of breast: Secondary | ICD-10-CM | POA: Diagnosis not present

## 2014-12-19 ENCOUNTER — Ambulatory Visit (INDEPENDENT_AMBULATORY_CARE_PROVIDER_SITE_OTHER): Payer: Medicare Other

## 2014-12-19 ENCOUNTER — Other Ambulatory Visit: Payer: Self-pay

## 2014-12-19 VITALS — BP 148/82

## 2014-12-19 DIAGNOSIS — Z23 Encounter for immunization: Secondary | ICD-10-CM

## 2014-12-19 NOTE — Progress Notes (Signed)
Patient ID: Jocelyn Camacho, female   DOB: 11/06/1946, 68 y.o.   MRN: [id number] Patient here for a blood pressure check and a Prevnar 13 injection.

## 2014-12-20 ENCOUNTER — Ambulatory Visit: Payer: Self-pay

## 2014-12-21 ENCOUNTER — Ambulatory Visit: Payer: Self-pay

## 2014-12-25 ENCOUNTER — Encounter (INDEPENDENT_AMBULATORY_CARE_PROVIDER_SITE_OTHER): Payer: Medicare Other | Admitting: Ophthalmology

## 2014-12-25 DIAGNOSIS — H43813 Vitreous degeneration, bilateral: Secondary | ICD-10-CM

## 2014-12-25 DIAGNOSIS — H34832 Tributary (branch) retinal vein occlusion, left eye: Secondary | ICD-10-CM | POA: Diagnosis not present

## 2014-12-25 DIAGNOSIS — H2513 Age-related nuclear cataract, bilateral: Secondary | ICD-10-CM

## 2014-12-25 DIAGNOSIS — I10 Essential (primary) hypertension: Secondary | ICD-10-CM

## 2014-12-25 DIAGNOSIS — H34812 Central retinal vein occlusion, left eye: Secondary | ICD-10-CM

## 2014-12-25 DIAGNOSIS — H35033 Hypertensive retinopathy, bilateral: Secondary | ICD-10-CM

## 2014-12-26 ENCOUNTER — Telehealth: Payer: Self-pay

## 2014-12-26 NOTE — Telephone Encounter (Signed)
Spoke with patient regarding Zostavax and Tetanus, she received the Prevnar 13 on 12-19-2014, she was advised that she needs to wait 8 weeks after before receiving the Zostavax, she at this time will need to pay  $ 50 when she gets the shingles/zostavax, will recheck once she schedules to make sure price did not change from the new year. She did check and has not received these injections any where else.

## 2015-02-12 ENCOUNTER — Encounter (INDEPENDENT_AMBULATORY_CARE_PROVIDER_SITE_OTHER): Payer: Medicare Other | Admitting: Ophthalmology

## 2015-02-12 DIAGNOSIS — H34832 Tributary (branch) retinal vein occlusion, left eye: Secondary | ICD-10-CM

## 2015-02-12 DIAGNOSIS — I10 Essential (primary) hypertension: Secondary | ICD-10-CM | POA: Diagnosis not present

## 2015-02-12 DIAGNOSIS — H34812 Central retinal vein occlusion, left eye: Secondary | ICD-10-CM | POA: Diagnosis not present

## 2015-02-12 DIAGNOSIS — H43813 Vitreous degeneration, bilateral: Secondary | ICD-10-CM

## 2015-02-12 DIAGNOSIS — H35033 Hypertensive retinopathy, bilateral: Secondary | ICD-10-CM

## 2015-02-12 DIAGNOSIS — H2513 Age-related nuclear cataract, bilateral: Secondary | ICD-10-CM

## 2015-04-02 ENCOUNTER — Encounter (INDEPENDENT_AMBULATORY_CARE_PROVIDER_SITE_OTHER): Payer: Medicare Other | Admitting: Ophthalmology

## 2015-04-03 ENCOUNTER — Encounter (INDEPENDENT_AMBULATORY_CARE_PROVIDER_SITE_OTHER): Payer: Medicare Other | Admitting: Ophthalmology

## 2015-04-03 DIAGNOSIS — H34812 Central retinal vein occlusion, left eye: Secondary | ICD-10-CM

## 2015-04-03 DIAGNOSIS — H34832 Tributary (branch) retinal vein occlusion, left eye: Secondary | ICD-10-CM

## 2015-04-03 DIAGNOSIS — H43813 Vitreous degeneration, bilateral: Secondary | ICD-10-CM

## 2015-04-03 DIAGNOSIS — I10 Essential (primary) hypertension: Secondary | ICD-10-CM

## 2015-04-03 DIAGNOSIS — H35033 Hypertensive retinopathy, bilateral: Secondary | ICD-10-CM | POA: Diagnosis not present

## 2015-04-07 ENCOUNTER — Other Ambulatory Visit: Payer: Self-pay | Admitting: Internal Medicine

## 2015-04-16 ENCOUNTER — Encounter: Payer: Self-pay | Admitting: *Deleted

## 2015-05-22 ENCOUNTER — Encounter (INDEPENDENT_AMBULATORY_CARE_PROVIDER_SITE_OTHER): Payer: Medicare Other | Admitting: Ophthalmology

## 2015-05-22 DIAGNOSIS — H34832 Tributary (branch) retinal vein occlusion, left eye: Secondary | ICD-10-CM

## 2015-05-22 DIAGNOSIS — H35033 Hypertensive retinopathy, bilateral: Secondary | ICD-10-CM | POA: Diagnosis not present

## 2015-05-22 DIAGNOSIS — H34812 Central retinal vein occlusion, left eye: Secondary | ICD-10-CM

## 2015-05-22 DIAGNOSIS — H43813 Vitreous degeneration, bilateral: Secondary | ICD-10-CM | POA: Diagnosis not present

## 2015-05-28 ENCOUNTER — Other Ambulatory Visit: Payer: Self-pay | Admitting: Physician Assistant

## 2015-05-28 ENCOUNTER — Encounter: Payer: Self-pay | Admitting: *Deleted

## 2015-05-28 NOTE — Telephone Encounter (Signed)
No refill without OV 

## 2015-06-04 ENCOUNTER — Other Ambulatory Visit: Payer: Self-pay | Admitting: Internal Medicine

## 2015-06-08 ENCOUNTER — Other Ambulatory Visit: Payer: Self-pay | Admitting: Physician Assistant

## 2015-06-08 ENCOUNTER — Other Ambulatory Visit: Payer: Self-pay | Admitting: Internal Medicine

## 2015-06-13 ENCOUNTER — Ambulatory Visit: Payer: Self-pay | Admitting: Physician Assistant

## 2015-06-19 ENCOUNTER — Encounter: Payer: Self-pay | Admitting: Physician Assistant

## 2015-06-19 ENCOUNTER — Ambulatory Visit (INDEPENDENT_AMBULATORY_CARE_PROVIDER_SITE_OTHER): Payer: Medicare Other | Admitting: Physician Assistant

## 2015-06-19 VITALS — BP 140/82 | HR 88 | Temp 97.7°F | Resp 16 | Ht 63.0 in | Wt 243.0 lb

## 2015-06-19 DIAGNOSIS — E782 Mixed hyperlipidemia: Secondary | ICD-10-CM

## 2015-06-19 DIAGNOSIS — H3530 Unspecified macular degeneration: Secondary | ICD-10-CM | POA: Insufficient documentation

## 2015-06-19 DIAGNOSIS — Z23 Encounter for immunization: Secondary | ICD-10-CM

## 2015-06-19 DIAGNOSIS — Z6841 Body Mass Index (BMI) 40.0 and over, adult: Secondary | ICD-10-CM

## 2015-06-19 DIAGNOSIS — E1122 Type 2 diabetes mellitus with diabetic chronic kidney disease: Secondary | ICD-10-CM

## 2015-06-19 DIAGNOSIS — I10 Essential (primary) hypertension: Secondary | ICD-10-CM

## 2015-06-19 DIAGNOSIS — E2839 Other primary ovarian failure: Secondary | ICD-10-CM

## 2015-06-19 DIAGNOSIS — Z0001 Encounter for general adult medical examination with abnormal findings: Secondary | ICD-10-CM

## 2015-06-19 DIAGNOSIS — N182 Chronic kidney disease, stage 2 (mild): Secondary | ICD-10-CM

## 2015-06-19 DIAGNOSIS — Z79899 Other long term (current) drug therapy: Secondary | ICD-10-CM

## 2015-06-19 DIAGNOSIS — Z9181 History of falling: Secondary | ICD-10-CM

## 2015-06-19 DIAGNOSIS — R6889 Other general symptoms and signs: Secondary | ICD-10-CM

## 2015-06-19 DIAGNOSIS — E039 Hypothyroidism, unspecified: Secondary | ICD-10-CM

## 2015-06-19 DIAGNOSIS — Z1331 Encounter for screening for depression: Secondary | ICD-10-CM

## 2015-06-19 DIAGNOSIS — E559 Vitamin D deficiency, unspecified: Secondary | ICD-10-CM

## 2015-06-19 DIAGNOSIS — G4733 Obstructive sleep apnea (adult) (pediatric): Secondary | ICD-10-CM

## 2015-06-19 DIAGNOSIS — Z0000 Encounter for general adult medical examination without abnormal findings: Secondary | ICD-10-CM

## 2015-06-19 LAB — CBC WITH DIFFERENTIAL/PLATELET
BASOS ABS: 0.1 10*3/uL (ref 0.0–0.1)
BASOS PCT: 1 % (ref 0–1)
Eosinophils Absolute: 0.1 10*3/uL (ref 0.0–0.7)
Eosinophils Relative: 1 % (ref 0–5)
HCT: 36.7 % (ref 36.0–46.0)
Hemoglobin: 12 g/dL (ref 12.0–15.0)
Lymphocytes Relative: 33 % (ref 12–46)
Lymphs Abs: 2.6 10*3/uL (ref 0.7–4.0)
MCH: 26.9 pg (ref 26.0–34.0)
MCHC: 32.7 g/dL (ref 30.0–36.0)
MCV: 82.3 fL (ref 78.0–100.0)
MONO ABS: 0.6 10*3/uL (ref 0.1–1.0)
MPV: 10.7 fL (ref 8.6–12.4)
Monocytes Relative: 7 % (ref 3–12)
NEUTROS ABS: 4.6 10*3/uL (ref 1.7–7.7)
Neutrophils Relative %: 58 % (ref 43–77)
PLATELETS: 348 10*3/uL (ref 150–400)
RBC: 4.46 MIL/uL (ref 3.87–5.11)
RDW: 14.7 % (ref 11.5–15.5)
WBC: 7.9 10*3/uL (ref 4.0–10.5)

## 2015-06-19 LAB — HEPATIC FUNCTION PANEL
ALBUMIN: 4.1 g/dL (ref 3.5–5.2)
ALT: 19 U/L (ref 0–35)
AST: 17 U/L (ref 0–37)
Alkaline Phosphatase: 83 U/L (ref 39–117)
BILIRUBIN DIRECT: 0.2 mg/dL (ref 0.0–0.3)
BILIRUBIN INDIRECT: 0.5 mg/dL (ref 0.2–1.2)
Total Bilirubin: 0.7 mg/dL (ref 0.2–1.2)
Total Protein: 7.1 g/dL (ref 6.0–8.3)

## 2015-06-19 LAB — LIPID PANEL
Cholesterol: 155 mg/dL (ref 0–200)
HDL: 31 mg/dL — ABNORMAL LOW (ref >=46)
LDL CALC: 95 mg/dL (ref 0–99)
Total CHOL/HDL Ratio: 5 Ratio
Triglycerides: 144 mg/dL (ref <150)
VLDL: 29 mg/dL (ref 0–40)

## 2015-06-19 LAB — BASIC METABOLIC PANEL WITH GFR
BUN: 17 mg/dL (ref 6–23)
CHLORIDE: 101 meq/L (ref 96–112)
CO2: 30 meq/L (ref 19–32)
Calcium: 9.5 mg/dL (ref 8.4–10.5)
Creat: 0.86 mg/dL (ref 0.50–1.10)
GFR, Est African American: 80 mL/min
GFR, Est Non African American: 69 mL/min
GLUCOSE: 216 mg/dL — AB (ref 70–99)
POTASSIUM: 4.1 meq/L (ref 3.5–5.3)
Sodium: 144 mEq/L (ref 135–145)

## 2015-06-19 LAB — TSH: TSH: 1.263 u[IU]/mL (ref 0.350–4.500)

## 2015-06-19 LAB — MAGNESIUM: Magnesium: 1.9 mg/dL (ref 1.5–2.5)

## 2015-06-19 LAB — HEMOGLOBIN A1C
Hgb A1c MFr Bld: 6.6 % — ABNORMAL HIGH (ref <5.7)
Mean Plasma Glucose: 143 mg/dL — ABNORMAL HIGH (ref <117)

## 2015-06-19 MED ORDER — PREDNISOLONE ACETATE 1 % OP SUSP: OPHTHALMIC | Status: DC

## 2015-06-19 NOTE — Patient Instructions (Signed)
Add ENTERIC COATED low dose 81 mg Aspirin daily OR can do every other day if you have easy bruising to protect your heart and head. As well as to reduce risk of Colon Cancer by 20 %, Skin Cancer by 26 % , Melanoma by 46% and Pancreatic cancer by 60%  Diabetes is a very complicated disease...lets simplify it.  An easy way to look at it to understand the complications is if you think of the extra sugar floating in your blood stream as glass shards floating through your blood stream.    Diabetes affects your small vessels first: 1) The glass shards (sugar) scraps down the tiny blood vessels in your eyes and lead to diabetic retinopathy, the leading cause of blindness in the [place]. Diabetes is the leading cause of newly diagnosed adult (13 to 69 years of age) blindness in the [place].  2) The glass shards scratches down the tiny vessels of your legs leading to nerve damage called neuropathy and can lead to amputations of your feet. More than 60% of all non-traumatic amputations of lower limbs occur in people with diabetes.  3) Over time the small vessels in your brain are shredded and closed off, individually this does not cause any problems but over a long period of time many of the small vessels being blocked can lead to Vascular Dementia.   4) Your kidney's are a filter system and have a "net" that keeps certain things in the body and lets bad things out. Sugar shreds this net and leads to kidney damage and eventually failure. Decreasing the sugar that is destroying the net and certain blood pressure medications can help stop or decrease progression of kidney disease. Diabetes was the primary cause of kidney failure in 44 percent of all new cases in 2011.  5) Diabetes also destroys the small vessels in your penis that lead to erectile dysfunction. Eventually the vessels are so damaged that you may not be responsive to cialis or viagra.   Diabetes and your large vessels: Your larger vessels  consist of your coronary arteries in your heart and the carotid vessels to your brain. Diabetes or even increased sugars put you at 300% increased risk of heart attack and stroke and this is why.. The sugar scrapes down your large blood vessels and your body sees this as an internal injury and tries to repair itself. Just like you get a scab on your skin, your platelets will stick to the blood vessel wall trying to heal it. This is why we have diabetics on low dose aspirin daily, this prevents the platelets from sticking and can prevent plaque formation. In addition, your body takes cholesterol and tries to shove it into the open wound. This is why we want your LDL, or bad cholesterol, below 70.   The combination of platelets and cholesterol over 5-10 years forms plaque that can break off and cause a heart attack or stroke.   PLEASE REMEMBER:  Diabetes is preventable! Up to 46 percent of complications and morbidities among individuals with type 2 diabetes can be prevented, delayed, or effectively treated and minimized with regular visits to a health professional, appropriate monitoring and medication, and a healthy diet and lifestyle.  We want weight loss that will last so you should lose 1-2 pounds a week.  THAT IS IT! Please pick THREE things a month to change. Once it is a habit check off the item. Then pick another three items off the list to become habits.  If you are already doing a habit on the list GREAT!  Cross that item off! o Don't drink your calories. Ie, alcohol, soda, fruit juice, and sweet tea.  o Drink more water. Drink a glass when you feel hungry or before each meal.  o Eat breakfast - Complex carb and protein (likeDannon light and fit yogurt, oatmeal, fruit, eggs, [place] bacon). o Measure your cereal.  Eat no more than one cup a day. (ie [place]) o Eat an apple a day. o Add a vegetable a day. o Try a new vegetable a month. o Use Pam! Stop using oil or butter to cook. o Don't finish  your plate or use smaller plates. o Share your dessert. o Eat sugar free Jello for dessert or frozen grapes. o Don't eat 2-3 hours before bed. o Switch to whole wheat bread, pasta, and brown rice. o Make healthier choices when you eat out. No fries! o Pick baked chicken, NOT fried. o Don't forget to SLOW DOWN when you eat. It is not going anywhere.  o Take the stairs. o Park far away in the parking lot o [employer] (or weights) for 10 minutes while watching TV. o Walk at work for 10 minutes during break. o Walk outside 1 time a week with your friend, kids, dog, or significant other. o Start a walking group at [place] the mall as much as you can tolerate.  o Keep a food diary. o Weigh yourself daily. o Walk for 15 minutes 3 days per week. o Cook at home more often and eat out less.  If life happens and you go back to old habits, it is okay.  Just start over. You can do it!   If you experience chest pain, get short of breath, or tired during the exercise, please stop immediately and inform your doctor.   Before you even begin to attack a weight-loss plan, it pays to remember this: You are not fat. You have fat. Losing weight isn't about blame or shame; it's simply another achievement to accomplish. Dieting is like any other skill-you have to buckle down and work at it. As long as you act in a smart, reasonable way, you'll ultimately get where you want to be. Here are some weight loss pearls for you.  1. It's Not a Diet. It's a Lifestyle Thinking of a diet as something you're on and suffering through only for the short term doesn't work. To shed weight and keep it off, you need to make permanent changes to the way you eat. It's OK to indulge occasionally, of course, but if you cut calories temporarily and then revert to your old way of eating, you'll gain back the weight quicker than you can say yo-yo. Use it to lose it. Research shows that one of the best predictors of long-term  weight loss is how many pounds you drop in the first month. For that reason, nutritionists often suggest being stricter for the first two weeks of your new eating strategy to build momentum. Cut out added sugar and alcohol and avoid unrefined carbs. After that, figure out how you can reincorporate them in a way that's healthy and maintainable.  2. There's a Right Way to Exercise Working out burns calories and fat and boosts your metabolism by building muscle. But those trying to lose weight are notorious for overestimating the number of calories they burn and underestimating the amount they take in. Unfortunately, your system is biologically programmed to hold  on to extra pounds and that means when you start exercising, your body senses the deficit and ramps up its hunger signals. If you're not diligent, you'll eat everything you burn and then some. Use it to lose it. Cardio gets all the exercise glory, but strength and interval training are the real heroes. They help you build lean muscle, which in turn increases your metabolism and calorie-burning ability 3. Don't Overreact to Mild Hunger Some people have a hard time losing weight because of hunger anxiety. To them, being hungry is bad-something to be avoided at all costs-so they carry snacks with them and eat when they don't need to. Others eat because they're stressed out or bored. While you never want to get to the point of being ravenous (that's when bingeing is likely to happen), a hunger pang, a craving, or the fact that it's 3:00 p.m. should not send you racing for the vending machine or obsessing about the energy bar in your purse. Ideally, you should put off eating until your stomach is growling and it's difficult to concentrate.  Use it to lose it. When you feel the urge to eat, use the HALT method. Ask yourself, Am I really hungry? Or am I angry or anxious, lonely or bored, or tired? If you're still not certain, try the apple test. If you're truly  hungry, an apple should seem delicious; if it doesn't, something else is going on. Or you can try drinking water and making yourself busy, if you are still hungry try a healthy snack.  4. Not All Calories Are Created Equal The mechanics of weight loss are pretty simple: Take in fewer calories than you use for energy. But the kind of food you eat makes all the difference. Processed food that's high in saturated fat and refined starch or sugar can cause inflammation that disrupts the hormone signals that tell your brain you're full. The result: You eat a lot more.  Use it to lose it. Clean up your diet. Swap in whole, unprocessed foods, including vegetables, lean protein, and healthy fats that will fill you up and give you the biggest nutritional bang for your calorie buck. In a few weeks, as your brain starts receiving regular hunger and fullness signals once again, you'll notice that you feel less hungry overall and naturally start cutting back on the amount you eat.  5. Protein, Produce, and Plant-Based Fats Are Your Weight-Loss Trinity Here's why eating the three Ps regularly will help you drop pounds. Protein fills you up. You need it to build lean muscle, which keeps your metabolism humming so that you can torch more fat. People in a weight-loss program who ate double the recommended daily allowance for protein (about 110 grams for a 150-pound woman) lost 70 percent of their weight from fat, while people who ate the RDA lost only about 40 percent, one study found. Produce is packed with filling fiber. "It's very difficult to consume too many calories if you're eating a lot of vegetables. Example: Three cups of broccoli is a lot of food, yet only 93 calories. (Fruit is another story. It can be easy to overeat and can contain a lot of calories from sugar, so be sure to monitor your intake.) Plant-based fats like olive oil and those in avocados and nuts are healthy and extra satiating.  Use it to lose it.  Aim to incorporate each of the three Ps into every meal and snack. People who eat protein throughout the day are able  to keep weight off, according to a study in the [place] of Clinical Nutrition. In addition to meat, poultry and seafood, good sources are beans, lentils, eggs, tofu, and yogurt. As for fat, keep portion sizes in check by measuring out salad dressing, oil, and nut butters (shoot for one to two tablespoons). Finally, eat veggies or a little fruit at every meal. People who did that consumed 308 fewer calories but didn't feel any hungrier than when they didn't eat more produce.  7. How You Eat Is As Important As What You Eat In order for your brain to register that you're full, you need to focus on what you're eating. Sit down whenever you eat, preferably at a table. Turn off the TV or computer, put down your phone, and look at your food. Smell it. Chew slowly, and don't put another bite on your fork until you swallow. When women ate lunch this attentively, they consumed 30 percent less when snacking later than those who listened to an audiobook at lunchtime, according to a study in the [place] of Nutrition. 8. Weighing Yourself Really Works The scale provides the best evidence about whether your efforts are paying off. Seeing the numbers tick up or down or stagnate is motivation to keep going-or to rethink your approach. A 2015 study at [facility] found that daily weigh-ins helped people lose more weight, keep it off, and maintain that loss, even after two years. Use it to lose it. Step on the scale at the same time every day for the best results. If your weight shoots up several pounds from one weigh-in to the next, don't freak out. Eating a lot of salt the night before or having your period is the likely culprit. The number should return to normal in a day or two. It's a steady climb that you need to do something about. 9. Too Much Stress and Too Little Sleep Are Your  Enemies When you're tired and frazzled, your body cranks up the production of cortisol, the stress hormone that can cause carb cravings. Not getting enough sleep also boosts your levels of ghrelin, a hormone associated with hunger, while suppressing leptin, a hormone that signals fullness and satiety. People on a diet who slept only five and a half hours a night for two weeks lost 55 percent less fat and were hungrier than those who slept eight and a half hours, according to a study in the [place]. Use it to lose it. Prioritize sleep, aiming for seven hours or more a night, which research shows helps lower stress. And make sure you're getting quality zzz's. If a snoring spouse or a fidgety cat wakes you up frequently throughout the night, you may end up getting the equivalent of just four hours of sleep, according to a study from [facility]. Keep pets out of the bedroom, and use a white-noise app to drown out snoring. 10. You Will Hit a plateau-And You Can Bust Through It As you slim down, your body releases much less leptin, the fullness hormone.  If you're not strength training, start right now. Building muscle can raise your metabolism to help you overcome a plateau. To keep your body challenged and burning calories, incorporate new moves and more intense intervals into your workouts or add another sweat session to your weekly routine. Alternatively, cut an extra 100 calories or so a day from your diet. Now that you've lost weight, your body simply doesn't need as much fuel.

## 2015-06-19 NOTE — Progress Notes (Signed)
MEDICARE ANNUAL WELLNESS VISIT AND FOLLOW UP  Assessment:   1. Essential hypertension - continue medications, DASH diet, exercise and monitor at home. Call if greater than 130/80.  - CBC with Differential/Platelet - BASIC METABOLIC PANEL WITH GFR - Hepatic function panel  2. CKD stage 2 due to type 2 diabetes mellitus Discussed general issues about diabetes pathophysiology and management., Educational material distributed., Suggested low cholesterol diet., Encouraged aerobic exercise., Discussed foot care., Reminded to get yearly retinal exam. - Hemoglobin A1c - HM DIABETES FOOT EXAM  3. Hyperlipidemia -continue medications, check lipids, decrease fatty foods, increase activity.  - Lipid panel  4. Hypothyroidism, unspecified hypothyroidism type Hypothyroidism-check TSH level, continue medications the same, reminded to take on an empty stomach 30-89mins before food.  - TSH  5. Obesity, morbid Obesity with co morbidities- long discussion about weight loss, diet, and exercise  6. Vitamin D deficiency - Vit D  25 hydroxy (rtn osteoporosis monitoring)  7. Medication management - Magnesium  8. OSA (obstructive sleep apnea) Emphasized needs to be compliant with mask  9. Routine general medical examination at a health care facility  10. Patient had 1 fall this year Still low risk, discussed fall education/risk  11. Screening for depression negative  12. Macular degeneration normal  13. Estrogen deficiency - DG Bone Density; Future  14. Need for prophylactic vaccination with combined diphtheria-tetanus-pertussis (DTP) vaccine - Dt vaccine greater than 7yo IM   Plan:   During the course of the visit the patient was educated and counseled about appropriate screening and preventive services including:    Pneumococcal vaccine   Influenza vaccine  Td vaccine  Screening electrocardiogram  Screening mammography  Bone densitometry screening  Colorectal cancer  screening  Diabetes screening  Glaucoma screening  Nutrition counseling   Advanced directives: given info/requested  Conditions/risks identified: BMI: Discussed weight loss, diet, and increase physical activity.  Increase physical activity: AHA recommends 150 minutes of physical activity a week.  Medications reviewed DEXA- declined Diabetes is at goal, ACE/ARB therapy: Yes. Urinary Incontinence is not an issue: discussed non pharmacology and pharmacology options.  Fall risk: low- discussed PT, home fall assessment, medications.    Subjective:   Jocelyn Camacho is a 69 y.o. female who presents for Medicare Annual Wellness Visit and  OVER DUE 3 month follow up on hypertension, diabetes, hyperlipidemia, vitamin D def.  Date of last medicare wellness visit was 11/21/2014  Her blood pressure has been controlled at home, today their BP is BP: 140/82 mmHg She does workout but not regularly. She denies chest pain, shortness of breath, dizziness.  She is on cholesterol medication and denies myalgias. Her cholesterol is at goal. The cholesterol last visit was:   Lab Results  Component Value Date   CHOL 168 11/21/2014   HDL 36* 11/21/2014   LDLCALC 83 11/21/2014   TRIG 244* 11/21/2014   CHOLHDL 4.7 11/21/2014   She has been working on diet and exercise for diabetes with CKD stage II x 2013, she is on ACE, she is NOT on bASA and denies nausea, paresthesia of the feet and polydipsia. Last A1C in the office was:  Lab Results  Component Value Date   HGBA1C 7.0* 11/21/2014   She is on thyroid medication. Her medication was changed last visit, 1 tablet daily but 1.5 tablets Tues and Thursday.   Lab Results  Component Value Date   TSH 2.644 11/21/2014  .  Patient is on Vitamin D supplement. Lab Results  Component Value Date  VD25OH 33 11/21/2014   She is seeing Dr. Zigmund Daniel for Mac Degen and gets shots.  She is on ambien PRN, she has tried to use her sleep mask.  BMI is Body mass  index is 43.06 kg/(m^2)., she is working on diet and exercise. Wt Readings from Last 3 Encounters:  06/19/15 243 lb (110.224 kg)  11/21/14 249 lb (112.946 kg)  06/06/14 250 lb (113.399 kg)    Names of Other Physician/Practitioners you currently use: 1. [facility]- here for primary care 2. Eye Appointment, 05/23/2015 q 6 weeks 3. Dr. Rosealee Albee, q 6 months Patient Care Team: [name], MD as PCP - General (Internal Medicine) Warden Fillers, MD as Consulting Physician (Optometry) [name], MD as Consulting Physician (Ophthalmology) Inda Castle, MD as Consulting Physician (Gastroenterology) Druscilla Brownie, MD as Consulting Physician (Dermatology) Allyn Kenner, MD (Dermatology)  Medication Review Current Outpatient Prescriptions on File Prior to Visit  Medication Sig Dispense Refill  . atenolol (TENORMIN) 100 MG tablet Take 50 mg by mouth daily.    Marland Kitchen azelastine (OPTIVAR) 0.05 % ophthalmic solution instill 1 drop into both eyes twice a day 6 mL 11  . Azelastine HCl 0.15 % SOLN 1 spray each nostril twice daily as needed 30 mL 2  . b complex vitamins tablet Take 1 tablet by mouth daily.    Marland Kitchen BESIVANCE 0.6 % SUSP     . calcium carbonate (OS-CAL) 600 MG TABS Take 600 mg by mouth 2 (two) times daily with a meal.    . Cholecalciferol (VITAMIN D3) 2000 UNITS TABS Take 2,000 mcg by mouth 3 (three) times daily.    . diazepam (VALIUM) 5 MG tablet     . enalapril (VASOTEC) 20 MG tablet TAKE ONE TABLET BY MOUTH EVERY DAY FOR BLOOD PRESSURE 90 tablet 0  . Flaxseed, Linseed, (FLAXSEED OIL PO) Take by mouth daily.     . furosemide (LASIX) 40 MG tablet TAKE ONE TABLET BY MOUTH TWICE DAILY FOR BLOOD PRESSURE AND  FLUID 60 tablet 0  . glipiZIDE (GLUCOTROL) 5 MG tablet TAKE 1/2 TAB (2.5MG ) TOTAL THREE TIMES A DAY WITH MEALS 30 tablet 0  . hyoscyamine (LEVSIN, ANASPAZ) 0.125 MG tablet TAKE ONE TABLET BY MOUTH EVERY 6 HOURS AS NEEDED 60 tablet 0  .  KLOR-CON 10 10 MEQ tablet TAKE 1 TABLET BY MOUTH DAILY 30 tablet 3  . levothyroxine (SYNTHROID, LEVOTHROID) 100 MCG tablet TAKE ONE TABLET BY MOUTH ONCE DAILY BEFORE  BREAKFAST 90 tablet 0  . metFORMIN (GLUCOPHAGE) 500 MG tablet Take 2 tablets (1,000 mg total) by mouth 2 (two) times daily with a meal. 120 tablet 6  . Multiple Vitamin (MULTIVITAMIN) tablet Take 1 tablet by mouth daily.    . naproxen (NAPROSYN) 500 MG tablet Take 1 tablet (500 mg total) by mouth 2 (two) times daily with a meal. 60 tablet 2  . Omega-3 Fatty Acids (FISH OIL PO) Take 1 capsule by mouth daily.    . prednisoLONE acetate (PRED FORTE) 1 % ophthalmic suspension INSTILL ONE DROP INTO EACH EYE TWICE DAILY FOR  ALLERGIES 5 mL 0  . zolpidem (AMBIEN) 5 MG tablet Take 1 tablet (5 mg total) by mouth at bedtime as needed for sleep. 30 tablet 2   No current facility-administered medications on file prior to visit.    Current Problems (verified) Patient Active Problem List   Diagnosis Date Noted  . Medication management 03/29/2014  . Essential hypertension 11/16/2013  . Hyperlipidemia 11/16/2013  .  CKD stage 2 due to type 2 diabetes mellitus 11/16/2013  . Vitamin D deficiency 11/16/2013  . Obesity, morbid 11/16/2013  . OSA (obstructive sleep apnea) 11/16/2013  . Hypothyroidism 11/16/2013    Screening Tests Health Maintenance  Topic Date Due  . DEXA SCAN  03/28/2011  . HEMOGLOBIN A1C  05/23/2015  . INFLUENZA VACCINE  09/12/2015 (Originally 07/23/2015)  . OPHTHALMOLOGY EXAM  11/03/2015  . FOOT EXAM  11/22/2015  . URINE MICROALBUMIN  11/22/2015  . PNA vac Low Risk Adult (2 of 2 - PPSV23) 12/20/2015  . MAMMOGRAM  11/30/2016  . TETANUS/TDAP  02/27/2024  . COLONOSCOPY  04/17/2024  . ZOSTAVAX  Addressed     Immunization History  Administered Date(s) Administered  . DTaP 12/09/2004  . Pneumococcal Conjugate-13 12/19/2014  . Pneumococcal Polysaccharide-23 03/02/2009   Preventative care: Last colonoscopy: 03/2014  normal Last mammogram: 11/2014 Last pap smear/pelvic exam: 2012 remote DEXA: will get with MGM this year 2013 carotid US  Prior vaccinations: TD or Tdap: 2005 DUE   Influenza: 2015 Prevnar 13: 2015 Pneumococcal: 2010 Shingles/Zostavax: declines  Allergies Allergies  Allergen Reactions  . Penicillins Swelling   Surgical history Past Surgical History  Procedure Laterality Date  .  eye procedure      have treatment where a needle is stuck into left eye- since June 2014; now every six weeks  . Tubal ligation    . Dental examination under anesthesia     Family history Family History  Problem Relation Age of Onset  . Breast cancer Mother   . Renal Disease Mother   . Hypertension Sister   . Diabetes Sister   . Colon cancer Neg Hx   . Esophageal cancer Neg Hx   . Stomach cancer Neg Hx   . Rectal cancer Neg Hx    Risk Factors: Osteoporosis: postmenopausal estrogen deficiency and dietary calcium and/or vitamin D deficiency History of fracture in the past year: no  Tobacco History  Substance Use Topics  . Smoking status: Never Smoker   . Smokeless tobacco: Never Used  . Alcohol Use: Yes     Comment: occasional   She does not smoke.  Patient is not a former smoker. Are there smokers in your home (other than you)?  No  Alcohol Current alcohol use: social drinker  Caffeine Current caffeine use: coffee 1 /day  Exercise Current exercise: none  Nutrition/Diet Current diet: in general, an "unhealthy" diet  Cardiac risk factors: advanced age (older than 58 for men, 71 for women), diabetes mellitus, dyslipidemia, hypertension, obesity (BMI >= 30 kg/m2) and sedentary lifestyle.  Depression Screen  Q1: Over the past two weeks, have you felt down, depressed or hopeless? No  Q2: Over the past two weeks, have you felt little interest or pleasure in doing things? No  Have you lost interest or pleasure in daily life? No  Do you often feel hopeless? No  Do you cry easily  over simple problems? No  Activities of Daily Living In your present state of health, do you have any difficulty performing the following activities?:  Driving? No Managing money?  No Feeding yourself? No Getting from bed to chair? No Climbing a flight of stairs? Yes Preparing food and eating?: No Bathing or showering? No Getting dressed: No Getting to the toilet? No Using the toilet:No Moving around from place to place: No In the past year have you fallen or had a near fall?:Yes one fall, no injury   Are you sexually active?  No  Do you have more than one partner?  No  Vision Difficulties: Yes  Hearing Difficulties: No Do you often ask people to speak up or repeat themselves? yes Do you experience ringing or noises in your ears? No Do you have difficulty understanding soft or whispered voices? No  Cognition  Do you feel that you have a problem with memory?No  Do you often misplace items? No  Do you feel safe at home?  Yes  Advanced directives Does patient have a Rhodes? No Does patient have a Living Will? No   Objective:   Blood pressure 140/82, pulse 88, temperature 97.7 F (36.5 C), resp. rate 16, height 5\' 3"  (1.6 m), weight 243 lb (110.224 kg). Body mass index is 43.06 kg/(m^2).  General appearance: alert, no distress, WD/WN,  female Cognitive Testing  Alert? Yes  Normal Appearance?Yes  Oriented to person? Yes  Place? Yes   Time? Yes  Recall of three objects?  Yes  Can perform simple calculations? Yes  Displays appropriate judgment?Yes  Can read the correct time from a watch face?Yes  HEENT: normocephalic, sclerae anicteric, TMs pearly, nares patent, no discharge or erythema, pharynx normal Oral cavity: MMM, no lesions Neck: supple, no lymphadenopathy, no thyromegaly, no masses Heart: RRR, normal S1, S2, no murmurs Lungs: CTA bilaterally, no wheezes, rhonchi, or rales Abdomen: +bs, soft, obese, non tender, non distended, no  masses, no hepatomegaly, no splenomegaly Musculoskeletal: nontender, no swelling, no obvious deformity Extremities: trace bilateral edema, no cyanosis, no clubbing Pulses: 2+ symmetric, upper and lower extremities, normal cap refill Neurological: alert, oriented x 3, CN2-12 intact, strength normal upper extremities and lower extremities, sensation decreased bilateral feet to ankle DTRs 2+ throughout, no cerebellar signs, gait antalgic and wide based likely due to body habitus Psychiatric: normal affect, behavior normal, pleasant  Breast: defer Gyn: defer Rectal: defer  Medicare Attestation I have personally reviewed: The patient's medical and social history Their use of alcohol, tobacco or illicit drugs Their current medications and supplements The patient's functional ability including ADLs,fall risks, home safety risks, cognitive, and hearing and visual impairment Diet and physical activities Evidence for depression or mood disorders  The patient's weight, height, BMI, and visual acuity have been recorded in the chart.  I have made referrals, counseling, and provided education to the patient based on review of the above and I have provided the patient with a written personalized care plan for preventive services.     Vicie Mutters, PA-C   06/19/2015

## 2015-06-20 LAB — VITAMIN D 25 HYDROXY (VIT D DEFICIENCY, FRACTURES): Vit D, 25-Hydroxy: 38 ng/mL (ref 30–100)

## 2015-07-05 ENCOUNTER — Other Ambulatory Visit: Payer: Self-pay

## 2015-07-05 MED ORDER — METFORMIN HCL 500 MG PO TABS: 1000.0000 mg | ORAL_TABLET | Freq: Two times a day (BID) | ORAL | Status: DC

## 2015-07-10 ENCOUNTER — Encounter (INDEPENDENT_AMBULATORY_CARE_PROVIDER_SITE_OTHER): Payer: Medicare Other | Admitting: Ophthalmology

## 2015-07-10 ENCOUNTER — Other Ambulatory Visit: Payer: Self-pay | Admitting: Internal Medicine

## 2015-07-10 DIAGNOSIS — H34812 Central retinal vein occlusion, left eye: Secondary | ICD-10-CM | POA: Diagnosis not present

## 2015-07-10 DIAGNOSIS — H35033 Hypertensive retinopathy, bilateral: Secondary | ICD-10-CM | POA: Diagnosis not present

## 2015-07-10 DIAGNOSIS — I10 Essential (primary) hypertension: Secondary | ICD-10-CM | POA: Diagnosis not present

## 2015-07-10 DIAGNOSIS — H43813 Vitreous degeneration, bilateral: Secondary | ICD-10-CM | POA: Diagnosis not present

## 2015-07-10 DIAGNOSIS — H34832 Tributary (branch) retinal vein occlusion, left eye: Secondary | ICD-10-CM

## 2015-07-16 ENCOUNTER — Other Ambulatory Visit: Payer: Self-pay | Admitting: *Deleted

## 2015-07-16 MED ORDER — GLIPIZIDE 5 MG PO TABS: ORAL_TABLET | ORAL | Status: DC

## 2015-08-03 ENCOUNTER — Other Ambulatory Visit: Payer: Self-pay | Admitting: Physician Assistant

## 2015-08-03 DIAGNOSIS — I10 Essential (primary) hypertension: Secondary | ICD-10-CM

## 2015-08-03 DIAGNOSIS — E039 Hypothyroidism, unspecified: Secondary | ICD-10-CM

## 2015-08-07 ENCOUNTER — Other Ambulatory Visit: Payer: Self-pay | Admitting: Physician Assistant

## 2015-08-07 DIAGNOSIS — E039 Hypothyroidism, unspecified: Secondary | ICD-10-CM

## 2015-08-07 MED ORDER — LEVOTHYROXINE SODIUM 100 MCG PO TABS: ORAL_TABLET | ORAL | Status: DC

## 2015-08-16 ENCOUNTER — Other Ambulatory Visit: Payer: Self-pay | Admitting: *Deleted

## 2015-08-16 MED ORDER — GLIPIZIDE 5 MG PO TABS: ORAL_TABLET | ORAL | Status: DC

## 2015-08-20 ENCOUNTER — Other Ambulatory Visit: Payer: Self-pay | Admitting: *Deleted

## 2015-08-20 DIAGNOSIS — I10 Essential (primary) hypertension: Secondary | ICD-10-CM

## 2015-08-20 MED ORDER — GLIPIZIDE 5 MG PO TABS: ORAL_TABLET | ORAL | Status: DC

## 2015-09-05 ENCOUNTER — Encounter (INDEPENDENT_AMBULATORY_CARE_PROVIDER_SITE_OTHER): Payer: Medicare Other | Admitting: Ophthalmology

## 2015-09-05 DIAGNOSIS — I10 Essential (primary) hypertension: Secondary | ICD-10-CM | POA: Diagnosis not present

## 2015-09-05 DIAGNOSIS — H2513 Age-related nuclear cataract, bilateral: Secondary | ICD-10-CM

## 2015-09-05 DIAGNOSIS — H35033 Hypertensive retinopathy, bilateral: Secondary | ICD-10-CM

## 2015-09-05 DIAGNOSIS — H34812 Central retinal vein occlusion, left eye: Secondary | ICD-10-CM | POA: Diagnosis not present

## 2015-09-05 DIAGNOSIS — H34832 Tributary (branch) retinal vein occlusion, left eye: Secondary | ICD-10-CM

## 2015-09-05 DIAGNOSIS — H43813 Vitreous degeneration, bilateral: Secondary | ICD-10-CM | POA: Diagnosis not present

## 2015-09-14 ENCOUNTER — Other Ambulatory Visit: Payer: Self-pay | Admitting: Physician Assistant

## 2015-09-14 ENCOUNTER — Other Ambulatory Visit: Payer: Self-pay | Admitting: Internal Medicine

## 2015-09-22 ENCOUNTER — Other Ambulatory Visit: Payer: Self-pay | Admitting: Internal Medicine

## 2015-10-05 ENCOUNTER — Ambulatory Visit: Payer: Self-pay | Admitting: Internal Medicine

## 2015-10-15 ENCOUNTER — Other Ambulatory Visit: Payer: Self-pay | Admitting: Internal Medicine

## 2015-10-16 ENCOUNTER — Other Ambulatory Visit: Payer: Self-pay | Admitting: *Deleted

## 2015-10-16 MED ORDER — POTASSIUM CHLORIDE ER 10 MEQ PO TBCR: 10.0000 meq | EXTENDED_RELEASE_TABLET | Freq: Every day | ORAL | Status: DC

## 2015-10-22 ENCOUNTER — Encounter: Payer: Self-pay | Admitting: Internal Medicine

## 2015-10-22 ENCOUNTER — Ambulatory Visit (INDEPENDENT_AMBULATORY_CARE_PROVIDER_SITE_OTHER): Payer: Medicare Other | Admitting: Internal Medicine

## 2015-10-22 VITALS — BP 152/88 | HR 72 | Temp 97.4°F | Resp 16 | Ht 63.0 in | Wt 243.6 lb

## 2015-10-22 DIAGNOSIS — G4733 Obstructive sleep apnea (adult) (pediatric): Secondary | ICD-10-CM | POA: Diagnosis not present

## 2015-10-22 DIAGNOSIS — N182 Chronic kidney disease, stage 2 (mild): Secondary | ICD-10-CM

## 2015-10-22 DIAGNOSIS — E1122 Type 2 diabetes mellitus with diabetic chronic kidney disease: Secondary | ICD-10-CM

## 2015-10-22 DIAGNOSIS — I10 Essential (primary) hypertension: Secondary | ICD-10-CM

## 2015-10-22 DIAGNOSIS — E782 Mixed hyperlipidemia: Secondary | ICD-10-CM

## 2015-10-22 DIAGNOSIS — E039 Hypothyroidism, unspecified: Secondary | ICD-10-CM

## 2015-10-22 DIAGNOSIS — E559 Vitamin D deficiency, unspecified: Secondary | ICD-10-CM | POA: Diagnosis not present

## 2015-10-22 DIAGNOSIS — Z79899 Other long term (current) drug therapy: Secondary | ICD-10-CM | POA: Diagnosis not present

## 2015-10-22 DIAGNOSIS — Z6841 Body Mass Index (BMI) 40.0 and over, adult: Secondary | ICD-10-CM

## 2015-10-22 LAB — BASIC METABOLIC PANEL WITH GFR
BUN: 17 mg/dL (ref 7–25)
CO2: 26 mmol/L (ref 20–31)
Calcium: 9.2 mg/dL (ref 8.6–10.4)
Chloride: 107 mmol/L (ref 98–110)
Creat: 1.09 mg/dL — ABNORMAL HIGH (ref 0.50–0.99)
GFR, Est African American: 60 mL/min (ref >=60)
GFR, Est Non African American: 52 mL/min — ABNORMAL LOW (ref >=60)
Glucose, Bld: 113 mg/dL — ABNORMAL HIGH (ref 65–99)
Potassium: 4.3 mmol/L (ref 3.5–5.3)
Sodium: 141 mmol/L (ref 135–146)

## 2015-10-22 LAB — LIPID PANEL
Cholesterol: 139 mg/dL (ref 125–200)
HDL: 30 mg/dL — AB (ref >=46)
LDL CALC: 86 mg/dL (ref <130)
Total CHOL/HDL Ratio: 4.6 Ratio (ref <=5.0)
Triglycerides: 117 mg/dL (ref <150)
VLDL: 23 mg/dL (ref <30)

## 2015-10-22 LAB — HEPATIC FUNCTION PANEL
ALT: 11 U/L (ref 6–29)
AST: 15 U/L (ref 10–35)
Albumin: 4 g/dL (ref 3.6–5.1)
Alkaline Phosphatase: 72 U/L (ref 33–130)
Bilirubin, Direct: 0.1 mg/dL (ref <=0.2)
Indirect Bilirubin: 0.5 mg/dL (ref 0.2–1.2)
Total Bilirubin: 0.6 mg/dL (ref 0.2–1.2)
Total Protein: 6.8 g/dL (ref 6.1–8.1)

## 2015-10-22 LAB — CBC WITH DIFFERENTIAL/PLATELET
Basophils Absolute: 0.1 10*3/uL (ref 0.0–0.1)
Basophils Relative: 1 % (ref 0–1)
Eosinophils Absolute: 0.2 10*3/uL (ref 0.0–0.7)
Eosinophils Relative: 3 % (ref 0–5)
HCT: 34.3 % — ABNORMAL LOW (ref 36.0–46.0)
Hemoglobin: 11.4 g/dL — ABNORMAL LOW (ref 12.0–15.0)
Lymphocytes Relative: 33 % (ref 12–46)
Lymphs Abs: 2.7 10*3/uL (ref 0.7–4.0)
MCH: 27.1 pg (ref 26.0–34.0)
MCHC: 33.2 g/dL (ref 30.0–36.0)
MCV: 81.7 fL (ref 78.0–100.0)
MPV: 10.6 fL (ref 8.6–12.4)
Monocytes Absolute: 0.4 10*3/uL (ref 0.1–1.0)
Monocytes Relative: 5 % (ref 3–12)
Neutro Abs: 4.8 10*3/uL (ref 1.7–7.7)
Neutrophils Relative %: 58 % (ref 43–77)
Platelets: 350 10*3/uL (ref 150–400)
RBC: 4.2 MIL/uL (ref 3.87–5.11)
RDW: 14.9 % (ref 11.5–15.5)
WBC: 8.2 10*3/uL (ref 4.0–10.5)

## 2015-10-22 LAB — TSH: TSH: 4.756 u[IU]/mL — ABNORMAL HIGH (ref 0.350–4.500)

## 2015-10-22 LAB — MAGNESIUM: MAGNESIUM: 2 mg/dL (ref 1.5–2.5)

## 2015-10-22 LAB — HEMOGLOBIN A1C
Hgb A1c MFr Bld: 6.8 % — ABNORMAL HIGH (ref <5.7)
Mean Plasma Glucose: 148 mg/dL — ABNORMAL HIGH (ref <117)

## 2015-10-22 MED ORDER — METFORMIN HCL ER 500 MG PO TB24: ORAL_TABLET | ORAL | Status: DC

## 2015-10-22 MED ORDER — ASPIRIN EC 81 MG PO TBEC: 81.0000 mg | DELAYED_RELEASE_TABLET | Freq: Every day | ORAL | Status: AC

## 2015-10-22 NOTE — Patient Instructions (Signed)

## 2015-10-22 NOTE — Progress Notes (Signed)
Patient ID: Jocelyn Camacho, female   DOB: 26-Jan-1946, 69 y.o.   MRN: [id number]   This very nice 69 y.o. MBF presents for 3 month follow up with Hypertension, Hyperlipidemia, T2_DM w/CKD and Vitamin D Deficiency.    Patient is treated for HTN since 1994 & BP has been controlled at home. Today's BP: (!) 152/88 mmHg. Patient has had no complaints of any cardiac type chest pain, palpitations, dyspnea/orthopnea/PND, dizziness, claudication, or dependent edema.   Hyperlipidemia is controlled with diet & meds. Patient denies myalgias or other med SE's. Last Lipids were at goal with Cholesterol 155; HDL 31*; LDL 95; Triglycerides 144 on  06/19/2015.   Also, the patient has history of Morbid Obesity (BMI 43+) and consequent T2_NIDDM w/CKD since 2007 and has had no symptoms of reactive hypoglycemia, diabetic polys, paresthesias or visual blurring.  Last A1c was 6.6% on 06/19/2015.    Further, the patient also has history of Vitamin D Deficiency and supplements vitamin D without any suspected side-effects. Last vitamin D was  38 on 06/19/2015.  Medication Sig  . atenolol 100 MG tablet TAKE ONE TABLET BY MOUTH EVERY DAY  . azelastine (OPTIVAR) 0.05 % ophth soln instill 1 drop into both eyes twice a day  . Azelastine HCl 0.15 % SOLN 1 spray each nostril twice daily as needed  . b complex vitamins tablet Take 1 tablet by mouth daily.  Marland Kitchen BESIVANCE 0.6 % SUSP   . OS-CAL 600 MG TABS Take 600 mg by mouth 2 (two) times daily with a meal.  . VITAMIN D 2000 UNITS  Take 5,000 mcg by mouth daily.  . diazepam 5 MG tablet   . enalapril  20 MG tablet TAKE ONE TABLET BY MOUTH ONCE DAILY FOR BLOOD PRESSURE  . FLAXSEED OIL Take by mouth daily.   . furosemide  40 MG tablet Take 1 tablet 2 x daily for BP & fluid  . glipiZIDE 5 MG tablet TAKE ONE-HALF TO 1  TABLET BY MOUTH THREE TIMES DAILY WITH MEALS AS DIRECTED.  . hyoscyamine  0.125 MG tablet TAKE ONE TABLET BY MOUTH EVERY 6 HOURS AS NEEDED  . levothyroxine 100 MCG tablet 1  tablet daily but 1.5 tablets Tuesday and Thursday  . metFORMIN 500 MG tablet Take 2 tablets  2  times daily with a meal.  . naproxen  500 MG tablet TAKE ONE TABLET BY MOUTH TWICE DAILY WITH MEALS  . KLOR-CON 10 10 MEQ  Take 1 tablet (10 mEq total) by mouth daily.  Marland Kitchen PRED FORTE 1 % ophth susp INSTILL ONE DROP INTO EACH EYE TWICE DAILY FOR  ALLERGIES  . zolpidem  5 MG tablet Take 1 tablet (5 mg total) by mouth at bedtime as needed for sleep.  . Multiple Vitamin  Take 1 tablet by mouth daily.  Marland Kitchen FISH OIL  Take 1 capsule by mouth daily.     Allergies  Allergen Reactions  . Penicillins Swelling    PMHx:   Past Medical History  Diagnosis Date  . Hypertension   . Hyperlipidemia   . Obesity   . OSA (obstructive sleep apnea)     wears CPAP  . Vitamin D deficiency   . Allergic rhinitis   . Unspecified hypothyroidism   . Type II or unspecified type diabetes mellitus with renal manifestations, not stated as uncontrolled   . Allergy   . Anxiety     with medical procedures   Immunization History  Administered Date(s) Administered  . DT 06/19/2015  .  DTaP 12/09/2004  . Pneumococcal Conjugate-13 12/19/2014  . Pneumococcal Polysaccharide-23 03/02/2009   Past Surgical History  Procedure Laterality Date  .  eye procedure      have treatment where a needle is stuck into left eye- since June 2014; now every six weeks  . Tubal ligation    . Dental examination under anesthesia     FHx:    Reviewed / unchanged  SHx:    Reviewed / unchanged  Systems Review:  Constitutional: Denies fever, chills, wt changes, headaches, insomnia, fatigue, night sweats, change in appetite. Eyes: Denies redness, blurred vision, diplopia, discharge, itchy, watery eyes.  ENT: Denies discharge, congestion, post nasal drip, epistaxis, sore throat, earache, hearing loss, dental pain, tinnitus, vertigo, sinus pain, snoring.  CV: Denies chest pain, palpitations, irregular heartbeat, syncope, dyspnea, diaphoresis,  orthopnea, PND, claudication or edema. Respiratory: denies cough, dyspnea, DOE, pleurisy, hoarseness, laryngitis, wheezing.  Gastrointestinal: Denies dysphagia, odynophagia, heartburn, reflux, water brash, abdominal pain or cramps, nausea, vomiting, bloating, diarrhea, constipation, hematemesis, melena, hematochezia  or hemorrhoids. Genitourinary: Denies dysuria, frequency, urgency, nocturia, hesitancy, discharge, hematuria or flank pain. Musculoskeletal: Denies arthralgias, myalgias, stiffness, jt. swelling, pain, limping or strain/sprain.  Skin: Denies pruritus, rash, hives, warts, acne, eczema or change in skin lesion(s). Neuro: No weakness, tremor, incoordination, spasms, paresthesia or pain. Psychiatric: Denies confusion, memory loss or sensory loss. Endo: Denies change in weight, skin or hair change.  Heme/Lymph: No excessive bleeding, bruising or enlarged lymph nodes.  Physical Exam  BP 152/88 mmHg  Pulse 72  Temp(Src) 97.4 F (36.3 C)  Resp 16  Ht 5\' 3"  (1.6 m)  Wt 243 lb 9.6 oz (110.496 kg)  BMI 43.16 kg/m2  Appears over nourished and in no distress. Eyes: PERRLA, EOMs, conjunctiva no swelling or erythema. Sinuses: No frontal/maxillary tenderness ENT/Mouth: EAC's clear, TM's nl w/o erythema, bulging. Nares clear w/o erythema, swelling, exudates. Oropharynx clear without erythema or exudates. Oral hygiene is good. Tongue normal, non obstructing. Hearing intact.  Neck: Supple. Thyroid nl. Car 2+/2+ without bruits, nodes or JVD. Chest: Respirations nl with BS clear & equal w/o rales, rhonchi, wheezing or stridor.  Cor: Heart sounds normal w/ regular rate and rhythm without sig. murmurs, gallops, clicks, or rubs. Peripheral pulses normal and equal  without edema.  Abdomen: Soft & bowel sounds normal. Non-tender w/o guarding, rebound, hernias, masses, or organomegaly.  Lymphatics: Unremarkable.  Musculoskeletal: Full ROM all peripheral extremities, joint stability, 5/5 strength,  and normal gait.  Skin: Warm, dry without exposed rashes, lesions or ecchymosis apparent.  Neuro: Cranial nerves intact, reflexes equal bilaterally. Sensory-motor testing grossly intact. Tendon reflexes grossly intact.  Pysch: Alert & oriented x 3.  Insight and judgement nl & appropriate. No ideations.  Assessment and Plan:  1. Essential hypertension  - TSH  2. Hyperlipidemia  - Lipid panel  3. CKD stage 2 due to type 2 diabetes mellitus (HCC)  - Hemoglobin A1c - Insulin, random  4. Vitamin D deficiency  - Vit D  25 hydroxy   5. Hypothyroidism - TSH  6. OSA (obstructive sleep apnea)   7. Morbid Obesity / BMI 43.15,  adult ([place])   8. Medication management  - CBC with Differential/Platelet - BASIC METABOLIC PANEL WITH GFR - Hepatic function panel - Magnesium   Recommended regular exercise, BP monitoring, weight control, and discussed med and SE's. Recommended labs to assess and monitor clinical status. Further disposition pending results of labs. Over 30 minutes of exam, counseling, chart review was performed

## 2015-10-23 LAB — VITAMIN D 25 HYDROXY (VIT D DEFICIENCY, FRACTURES): VIT D 25 HYDROXY: 64 ng/mL (ref 30–100)

## 2015-10-23 LAB — INSULIN, RANDOM: Insulin: 9.5 u[IU]/mL (ref 2.0–19.6)

## 2015-10-30 ENCOUNTER — Other Ambulatory Visit: Payer: Self-pay | Admitting: Physician Assistant

## 2015-10-30 DIAGNOSIS — E039 Hypothyroidism, unspecified: Secondary | ICD-10-CM

## 2015-10-30 MED ORDER — LEVOTHYROXINE SODIUM 100 MCG PO TABS: ORAL_TABLET | ORAL | Status: DC

## 2015-10-31 ENCOUNTER — Encounter (INDEPENDENT_AMBULATORY_CARE_PROVIDER_SITE_OTHER): Payer: Medicare Other | Admitting: Ophthalmology

## 2015-10-31 DIAGNOSIS — H43813 Vitreous degeneration, bilateral: Secondary | ICD-10-CM

## 2015-10-31 DIAGNOSIS — H348121 Central retinal vein occlusion, left eye, with retinal neovascularization: Secondary | ICD-10-CM

## 2015-10-31 DIAGNOSIS — H34812 Central retinal vein occlusion, left eye, with macular edema: Secondary | ICD-10-CM

## 2015-10-31 DIAGNOSIS — H2513 Age-related nuclear cataract, bilateral: Secondary | ICD-10-CM | POA: Diagnosis not present

## 2015-10-31 DIAGNOSIS — H35033 Hypertensive retinopathy, bilateral: Secondary | ICD-10-CM

## 2015-10-31 DIAGNOSIS — I10 Essential (primary) hypertension: Secondary | ICD-10-CM | POA: Diagnosis not present

## 2015-10-31 DIAGNOSIS — H34832 Tributary (branch) retinal vein occlusion, left eye, with macular edema: Secondary | ICD-10-CM

## 2015-11-01 ENCOUNTER — Other Ambulatory Visit: Payer: Self-pay | Admitting: Physician Assistant

## 2015-11-01 DIAGNOSIS — Z1231 Encounter for screening mammogram for malignant neoplasm of breast: Secondary | ICD-10-CM

## 2015-11-06 LAB — HM DIABETES EYE EXAM

## 2015-11-22 ENCOUNTER — Encounter: Payer: Self-pay | Admitting: Physician Assistant

## 2015-11-22 ENCOUNTER — Ambulatory Visit (INDEPENDENT_AMBULATORY_CARE_PROVIDER_SITE_OTHER): Payer: Medicare Other | Admitting: *Deleted

## 2015-11-22 DIAGNOSIS — Z79899 Other long term (current) drug therapy: Secondary | ICD-10-CM | POA: Diagnosis not present

## 2015-11-22 DIAGNOSIS — E039 Hypothyroidism, unspecified: Secondary | ICD-10-CM

## 2015-11-22 LAB — BASIC METABOLIC PANEL WITH GFR
BUN: 21 mg/dL (ref 7–25)
CO2: 31 mmol/L (ref 20–31)
Calcium: 9.4 mg/dL (ref 8.6–10.4)
Chloride: 101 mmol/L (ref 98–110)
Creat: 0.78 mg/dL (ref 0.50–0.99)
GFR, EST NON AFRICAN AMERICAN: 78 mL/min (ref >=60)
Glucose, Bld: 114 mg/dL — ABNORMAL HIGH (ref 65–99)
POTASSIUM: 4.8 mmol/L (ref 3.5–5.3)
SODIUM: 139 mmol/L (ref 135–146)

## 2015-11-22 NOTE — Progress Notes (Signed)
Patient ID: Jocelyn Camacho, female   DOB: 1946/08/03, 70 y.o.   MRN: [id number] Patient presents for 1 month recheck BMET and TSH per Dr. Idell Pickles orders.  Patient currently taking Levothyroxine 100 mcg M,W,F and 150 mcg T, Th, Sat, Sun.

## 2015-11-23 LAB — TSH: TSH: 1.325 u[IU]/mL (ref 0.350–4.500)

## 2015-11-24 ENCOUNTER — Other Ambulatory Visit: Payer: Self-pay | Admitting: Physician Assistant

## 2015-11-26 ENCOUNTER — Other Ambulatory Visit (INDEPENDENT_AMBULATORY_CARE_PROVIDER_SITE_OTHER): Payer: Medicare Other | Admitting: Ophthalmology

## 2015-11-26 DIAGNOSIS — H34812 Central retinal vein occlusion, left eye, with macular edema: Secondary | ICD-10-CM

## 2015-12-07 ENCOUNTER — Other Ambulatory Visit: Payer: Self-pay

## 2015-12-07 ENCOUNTER — Ambulatory Visit: Payer: Self-pay

## 2015-12-20 ENCOUNTER — Ambulatory Visit
Admission: RE | Admit: 2015-12-20 | Discharge: 2015-12-20 | Disposition: A | Discharge status: Home / Self Care | Payer: Medicare Other | Source: Ambulatory Visit | Attending: Physician Assistant | Admitting: Physician Assistant

## 2015-12-20 DIAGNOSIS — Z1231 Encounter for screening mammogram for malignant neoplasm of breast: Secondary | ICD-10-CM

## 2015-12-20 DIAGNOSIS — E2839 Other primary ovarian failure: Secondary | ICD-10-CM

## 2015-12-26 ENCOUNTER — Encounter (INDEPENDENT_AMBULATORY_CARE_PROVIDER_SITE_OTHER): Payer: Medicare Other | Admitting: Ophthalmology

## 2015-12-26 DIAGNOSIS — I10 Essential (primary) hypertension: Secondary | ICD-10-CM

## 2015-12-26 DIAGNOSIS — H2513 Age-related nuclear cataract, bilateral: Secondary | ICD-10-CM

## 2015-12-26 DIAGNOSIS — H35033 Hypertensive retinopathy, bilateral: Secondary | ICD-10-CM

## 2015-12-26 DIAGNOSIS — H43813 Vitreous degeneration, bilateral: Secondary | ICD-10-CM | POA: Diagnosis not present

## 2015-12-26 DIAGNOSIS — H34812 Central retinal vein occlusion, left eye, with macular edema: Secondary | ICD-10-CM

## 2016-01-22 ENCOUNTER — Encounter (INDEPENDENT_AMBULATORY_CARE_PROVIDER_SITE_OTHER): Payer: Medicare Other | Admitting: Ophthalmology

## 2016-01-22 DIAGNOSIS — H35033 Hypertensive retinopathy, bilateral: Secondary | ICD-10-CM | POA: Diagnosis not present

## 2016-01-22 DIAGNOSIS — H34812 Central retinal vein occlusion, left eye, with macular edema: Secondary | ICD-10-CM

## 2016-01-22 DIAGNOSIS — H2513 Age-related nuclear cataract, bilateral: Secondary | ICD-10-CM

## 2016-01-22 DIAGNOSIS — I10 Essential (primary) hypertension: Secondary | ICD-10-CM | POA: Diagnosis not present

## 2016-01-22 DIAGNOSIS — H43813 Vitreous degeneration, bilateral: Secondary | ICD-10-CM

## 2016-01-23 ENCOUNTER — Other Ambulatory Visit: Payer: Self-pay | Admitting: Internal Medicine

## 2016-01-23 ENCOUNTER — Other Ambulatory Visit: Payer: Self-pay | Admitting: Emergency Medicine

## 2016-02-04 ENCOUNTER — Encounter: Payer: Self-pay | Admitting: Physician Assistant

## 2016-02-18 ENCOUNTER — Ambulatory Visit (INDEPENDENT_AMBULATORY_CARE_PROVIDER_SITE_OTHER): Payer: Medicare Other | Admitting: Physician Assistant

## 2016-02-18 ENCOUNTER — Encounter: Payer: Self-pay | Admitting: Physician Assistant

## 2016-02-18 ENCOUNTER — Other Ambulatory Visit: Payer: Self-pay

## 2016-02-18 VITALS — BP 130/74 | HR 70 | Temp 97.2°F | Resp 16 | Ht 63.0 in | Wt 241.6 lb

## 2016-02-18 DIAGNOSIS — N182 Chronic kidney disease, stage 2 (mild): Secondary | ICD-10-CM

## 2016-02-18 DIAGNOSIS — Z79899 Other long term (current) drug therapy: Secondary | ICD-10-CM

## 2016-02-18 DIAGNOSIS — G4733 Obstructive sleep apnea (adult) (pediatric): Secondary | ICD-10-CM

## 2016-02-18 DIAGNOSIS — E039 Hypothyroidism, unspecified: Secondary | ICD-10-CM | POA: Diagnosis not present

## 2016-02-18 DIAGNOSIS — I10 Essential (primary) hypertension: Secondary | ICD-10-CM

## 2016-02-18 DIAGNOSIS — Z1159 Encounter for screening for other viral diseases: Secondary | ICD-10-CM

## 2016-02-18 DIAGNOSIS — F411 Generalized anxiety disorder: Secondary | ICD-10-CM

## 2016-02-18 DIAGNOSIS — E1122 Type 2 diabetes mellitus with diabetic chronic kidney disease: Secondary | ICD-10-CM

## 2016-02-18 DIAGNOSIS — E559 Vitamin D deficiency, unspecified: Secondary | ICD-10-CM

## 2016-02-18 DIAGNOSIS — E782 Mixed hyperlipidemia: Secondary | ICD-10-CM

## 2016-02-18 DIAGNOSIS — Z0001 Encounter for general adult medical examination with abnormal findings: Secondary | ICD-10-CM | POA: Diagnosis not present

## 2016-02-18 DIAGNOSIS — H3530 Unspecified macular degeneration: Secondary | ICD-10-CM | POA: Diagnosis not present

## 2016-02-18 DIAGNOSIS — R6889 Other general symptoms and signs: Secondary | ICD-10-CM

## 2016-02-18 LAB — CBC WITH DIFFERENTIAL/PLATELET
Basophils Absolute: 0.1 10*3/uL (ref 0.0–0.1)
Basophils Relative: 1 % (ref 0–1)
Eosinophils Absolute: 0.2 10*3/uL (ref 0.0–0.7)
Eosinophils Relative: 2 % (ref 0–5)
HEMATOCRIT: 34.9 % — AB (ref 36.0–46.0)
HEMOGLOBIN: 11.3 g/dL — AB (ref 12.0–15.0)
LYMPHS ABS: 3.6 10*3/uL (ref 0.7–4.0)
LYMPHS PCT: 35 % (ref 12–46)
MCH: 26.5 pg (ref 26.0–34.0)
MCHC: 32.4 g/dL (ref 30.0–36.0)
MCV: 81.9 fL (ref 78.0–100.0)
MONO ABS: 0.4 10*3/uL (ref 0.1–1.0)
MONOS PCT: 4 % (ref 3–12)
MPV: 10.9 fL (ref 8.6–12.4)
NEUTROS ABS: 5.9 10*3/uL (ref 1.7–7.7)
Neutrophils Relative %: 58 % (ref 43–77)
Platelets: 359 10*3/uL (ref 150–400)
RBC: 4.26 MIL/uL (ref 3.87–5.11)
RDW: 14.4 % (ref 11.5–15.5)
WBC: 10.2 10*3/uL (ref 4.0–10.5)

## 2016-02-18 MED ORDER — DIAZEPAM 5 MG PO TABS: 5.0000 mg | ORAL_TABLET | Freq: Every evening | ORAL | Status: DC | PRN

## 2016-02-18 NOTE — Progress Notes (Signed)
Complete Physical  Assessment and Plan: 1. Essential hypertension - continue medications, DASH diet, exercise and monitor at home. Call if greater than 130/80.  - CBC with Differential - BASIC METABOLIC PANEL WITH GFR - Hepatic function panel - Urinalysis, Routine w reflex microscopic - Microalbumin / creatinine urine ratio - EKG 12-Lead - [place], RETROPERITNL ABD,  LTD  2. OSA (obstructive sleep apnea) Weight loss advised, needs to wear nightly, may benefit from new mask/titration  3. Hypothyroidism, unspecified hypothyroidism type Hypothyroidism-check TSH level, continue medications the same, reminded to take on an empty stomach 30-44mins before food.  - TSH  4. Hyperlipidemia -continue medications, check lipids, decrease fatty foods, increase activity.  - Lipid panel  5. Vitamin D deficiency - Vit D  25 hydroxy (rtn osteoporosis monitoring)  6. Obesity, morbid Obesity with co morbidities- long discussion about weight loss, diet, and exercise  7. Medication management - Magnesium  8. CKD stage 2 due to type 2 diabetes mellitus Discussed general issues about diabetes pathophysiology and management., Educational material distributed., Suggested low cholesterol diet., Encouraged aerobic exercise., Discussed foot care., Reminded to get yearly retinal exam. - Hemoglobin A1c - Insulin, fasting - HM DIABETES FOOT EXAM -WILL CALL WITH STRIPS THAT SHE NEEDS  9. Anxiety Will take valium prior to procedures but would like to be able to take AS needed for sleep, long discussion about addictive nature and tolerance, will only take PRN.   Discussed med's effects and SE's. Screening labs and tests as requested with regular follow-up as recommended. Future Appointments Date Time Provider [place]  03/04/2016 8:00 AM [name], MD [facility] None  02/18/2017 3:00 PM Vicie Mutters, PA-C [id number] None    HPI  70 y.o. AA female  presents for a complete physical.  She has  had elevated blood pressure since . Her blood pressure has been controlled at home, today their BP is BP: 130/74 mmHg She does workout but not regularly. She denies chest pain, shortness of breath, dizziness.  She is on cholesterol medication and denies myalgias. Her cholesterol is at goal. The cholesterol last visit was:   Lab Results  Component Value Date   CHOL 139 10/22/2015   HDL 30* 10/22/2015   LDLCALC 86 10/22/2015   TRIG 117 10/22/2015   CHOLHDL 4.6 10/22/2015   She has had diabetes for 3 years. She has been working on diet and exercise for Diabetes with CKD stage II, she is on ACE, she is on bASA, She is on metformin and glucotrol 5mg  TID and denies paresthesia of the feet, polydipsia and polyuria. Denies any hypoglycemia. Check sugars this AM it was 103. Last A1C in the office was:  Lab Results  Component Value Date   HGBA1C 6.8* 10/22/2015   Patient is on Vitamin D supplement.   Lab Results  Component Value Date   VD25OH 64 10/22/2015     She is on thyroid medication. Her medication was changed last visit. She is on 11mcg daily except 1.5 pills on Tuesday,Thursday, Saturday, and Sunday and takes it on an empty stomach. Patient denies feeling cold and cold intolerance, constipation, palpitations, weight loss, diarrhea  Lab Results  Component Value Date   TSH 1.325 11/22/2015   She is getting shots for Mac Degen with Dr. Zigmund Daniel.  BMI is Body mass index is 42.81 kg/(m^2)., she is working on diet and exercise. She has OSA and does CPAP occ but not every night, she got the mask several years ago and may benefit from  a re-titration. She also complains of excessive worry at night, increasing stress. She takes the ambien PRN for sleep and it does help when she takes it.  Wt Readings from Last 3 Encounters:  02/18/16 241 lb 9.6 oz (109.589 kg)  10/22/15 243 lb 9.6 oz (110.496 kg)  06/19/15 243 lb (110.224 kg)   Current Medications:  Current Outpatient Prescriptions on File  Prior to Visit  Medication Sig Dispense Refill  . aspirin EC 81 MG tablet Take 1 tablet (81 mg total) by mouth daily.    Marland Kitchen atenolol (TENORMIN) 100 MG tablet TAKE ONE TABLET BY MOUTH EVERY DAY 90 tablet 1  . azelastine (OPTIVAR) 0.05 % ophthalmic solution instill 1 drop into both eyes twice a day 6 mL 11  . Azelastine HCl 0.15 % SOLN 1 spray each nostril twice daily as needed 30 mL 2  . b complex vitamins tablet Take 1 tablet by mouth daily.    Marland Kitchen BESIVANCE 0.6 % SUSP     . calcium carbonate (OS-CAL) 600 MG TABS Take 600 mg by mouth 2 (two) times daily with a meal.    . Cholecalciferol (VITAMIN D3) 2000 UNITS TABS Take 5,000 mcg by mouth daily.    . diazepam (VALIUM) 5 MG tablet     . enalapril (VASOTEC) 20 MG tablet TAKE ONE TABLET BY MOUTH ONCE DAILY FOR BLOOD PRESSURE 30 tablet 0  . Flaxseed, Linseed, (FLAXSEED OIL PO) Take by mouth daily.     . furosemide (LASIX) 40 MG tablet Take 1 tablet 2 x daily for BP & fluid 60 tablet 2  . glipiZIDE (GLUCOTROL) 5 MG tablet TAKE ONE-HALF TO 1  TABLET BY MOUTH THREE TIMES DAILY WITH MEALS AS DIRECTED. 270 tablet 0  . hyoscyamine (LEVSIN, ANASPAZ) 0.125 MG tablet TAKE ONE TABLET BY MOUTH EVERY 6 HOURS AS NEEDED 60 tablet 0  . levothyroxine (SYNTHROID, LEVOTHROID) 100 MCG tablet 1.5 tabs 4 x week on T ThSS and 1 tab the other 3 days on MWF (= 9 tabs/week) 180 tablet 0  . metFORMIN (GLUCOPHAGE XR) 500 MG 24 hr tablet Take 2 tablets 2 x day for Diabetes 360 tablet 1  . naproxen (NAPROSYN) 500 MG tablet TAKE ONE TABLET BY MOUTH TWICE DAILY WITH MEALS 60 tablet 0  . potassium chloride (KLOR-CON 10) 10 MEQ tablet Take 1 tablet (10 mEq total) by mouth daily. 90 tablet 3  . prednisoLONE acetate (PRED FORTE) 1 % ophthalmic suspension INSTILL ONE DROP INTO EACH EYE TWICE DAILY 5 mL 1   No current facility-administered medications on file prior to visit.   Health Maintenance:   Immunization History  Administered Date(s) Administered  . DT 06/19/2015  . DTaP  12/09/2004  . Pneumococcal Conjugate-13 12/19/2014  . Pneumococcal Polysaccharide-23 03/02/2009   Last colonoscopy: 03/2014 normal Last mammogram: 11/2015, CAT A Last pap smear/pelvic exam: 2012 remote DEXA: 11/2015 normal Carotid US 2013  Prior vaccinations: TD or Tdap: 2016 Influenza: declines Pneumococcal: 2010 declines Prevnar 13: 2015 Shingles/Zostavax: declines  Patient Care Team: [name], MD as PCP - General (Internal Medicine) Warden Fillers, MD as Consulting Physician (Optometry)- within 6 months [name], MD as Consulting Physician (Ophthalmology)- saw Feb 2017, going back 6 weeks Inda Castle, MD as Consulting Physician (Gastroenterology) Druscilla Brownie, MD as Consulting Physician (Dermatology) Allyn Kenner, MD (Dermatology)  Allergies:  Allergies  Allergen Reactions  . Penicillins Swelling   Medical History:  Past Medical History  Diagnosis Date  . Hypertension   . Hyperlipidemia   .  Obesity   . OSA (obstructive sleep apnea)     wears CPAP  . Vitamin D deficiency   . Allergic rhinitis   . Unspecified hypothyroidism   . Type II or unspecified type diabetes mellitus with renal manifestations, not stated as uncontrolled   . Allergy   . Anxiety     with medical procedures   Surgical History:  Past Surgical History  Procedure Laterality Date  .  eye procedure      have treatment where a needle is stuck into left eye- since June 2014; now every six weeks  . Tubal ligation    . Dental examination under anesthesia     Family History:  Family History  Problem Relation Age of Onset  . Breast cancer Mother   . Renal Disease Mother   . Hypertension Sister   . Diabetes Sister   . Colon cancer Neg Hx   . Esophageal cancer Neg Hx   . Stomach cancer Neg Hx   . Rectal cancer Neg Hx    Social History:  Social History  Substance Use Topics  . Smoking status: Never Smoker   . Smokeless tobacco: Never Used  . Alcohol Use: Yes      Comment: occasional   Review of Systems  Constitutional: Negative.   HENT: Negative.   Eyes: Negative.   Respiratory: Negative.   Cardiovascular: Negative.   Gastrointestinal: Negative.   Genitourinary: Positive for frequency. Negative for dysuria, urgency, hematuria and flank pain.  Musculoskeletal: Negative.   Skin: Negative.   Neurological: Negative.   Endo/Heme/Allergies: Negative.   Psychiatric/Behavioral: Negative for depression, suicidal ideas, hallucinations, memory loss and substance abuse. The patient is nervous/anxious. The patient does not have insomnia.     Physical Exam: Estimated body mass index is 42.81 kg/(m^2) as calculated from the following:   Height as of this encounter: 5\' 3"  (1.6 m).   Weight as of this encounter: 241 lb 9.6 oz (109.589 kg). BP 130/74 mmHg  Pulse 70  Temp(Src) 97.2 F (36.2 C)  Resp 16  Ht 5\' 3"  (1.6 m)  Wt 241 lb 9.6 oz (109.589 kg)  BMI 42.81 kg/m2  SpO2 96% General Appearance: Well nourished, in no apparent distress.  Eyes: PERRLA, EOMs, conjunctiva no swelling or erythema, normal fundi and vessels.  Sinuses: No Frontal/maxillary tenderness  ENT/Mouth: Ext aud canals clear, normal light reflex with TMs without erythema, bulging. Good dentition. No erythema, swelling, or exudate on post pharynx. Tonsils not swollen or erythematous. Hearing normal.  Neck: Supple, thyroid normal. No bruits  Respiratory: Respiratory effort normal, BS equal bilaterally without rales, rhonchi, wheezing or stridor.  Cardio: RRR with 2/6 systolic murmurs RSB, without rubs or gallops. Brisk peripheral pulses without edema.  Chest: symmetric, with normal excursions and percussion.  Breasts: declines, states getting MGM Abdomen: Soft, nontender, no guarding, rebound, hernias, masses, or organomegaly. .  Lymphatics: Non tender without lymphadenopathy.  Genitourinary: defer Musculoskeletal: Full ROM all peripheral extremities,5/5 strength, and normal gait.   Skin: Vitiligo bilateral hands and feet. Warm, dry without rashes, lesions, ecchymosis. Neuro: Cranial nerves intact, reflexes equal bilaterally. Normal muscle tone, no cerebellar symptoms. Sensation intact.  Psych: Awake and oriented X 3, normal affect, Insight and Judgment appropriate.   EKG: WNL no changes. AORTA SCAN: defer   Vicie Mutters 3:18 PM [facility]

## 2016-02-19 LAB — HEPATIC FUNCTION PANEL
ALBUMIN: 4.1 g/dL (ref 3.6–5.1)
ALK PHOS: 60 U/L (ref 33–130)
ALT: 12 U/L (ref 6–29)
AST: 15 U/L (ref 10–35)
Bilirubin, Direct: 0.1 mg/dL (ref <=0.2)
Indirect Bilirubin: 0.4 mg/dL (ref 0.2–1.2)
TOTAL PROTEIN: 7.2 g/dL (ref 6.1–8.1)
Total Bilirubin: 0.5 mg/dL (ref 0.2–1.2)

## 2016-02-19 LAB — LIPID PANEL
CHOLESTEROL: 142 mg/dL (ref 125–200)
HDL: 30 mg/dL — ABNORMAL LOW (ref >=46)
LDL CALC: 86 mg/dL (ref <130)
TRIGLYCERIDES: 131 mg/dL (ref <150)
Total CHOL/HDL Ratio: 4.7 Ratio (ref <=5.0)
VLDL: 26 mg/dL (ref <30)

## 2016-02-19 LAB — URINALYSIS, MICROSCOPIC ONLY
CASTS: NONE SEEN [LPF]
CRYSTALS: NONE SEEN [HPF]
RBC / HPF: NONE SEEN RBC/HPF (ref <=2)
SQUAMOUS EPITHELIAL / LPF: NONE SEEN [HPF] (ref <=5)
YEAST: NONE SEEN [HPF]

## 2016-02-19 LAB — URINALYSIS, ROUTINE W REFLEX MICROSCOPIC
Bilirubin Urine: NEGATIVE
GLUCOSE, UA: NEGATIVE
HGB URINE DIPSTICK: NEGATIVE
KETONES UR: NEGATIVE
Nitrite: NEGATIVE
PROTEIN: NEGATIVE
Specific Gravity, Urine: 1.014 (ref 1.001–1.035)
pH: 5.5 (ref 5.0–8.0)

## 2016-02-19 LAB — HEMOGLOBIN A1C
Hgb A1c MFr Bld: 6.6 % — ABNORMAL HIGH (ref <5.7)
MEAN PLASMA GLUCOSE: 143 mg/dL — AB (ref <117)

## 2016-02-19 LAB — BASIC METABOLIC PANEL WITH GFR
BUN: 31 mg/dL — ABNORMAL HIGH (ref 7–25)
CALCIUM: 9.5 mg/dL (ref 8.6–10.4)
CO2: 29 mmol/L (ref 20–31)
CREATININE: 0.99 mg/dL (ref 0.50–0.99)
Chloride: 100 mmol/L (ref 98–110)
GFR, Est African American: 67 mL/min (ref >=60)
GFR, Est Non African American: 58 mL/min — ABNORMAL LOW (ref >=60)
GLUCOSE: 75 mg/dL (ref 65–99)
Potassium: 4.2 mmol/L (ref 3.5–5.3)
Sodium: 141 mmol/L (ref 135–146)

## 2016-02-19 LAB — MAGNESIUM: MAGNESIUM: 1.9 mg/dL (ref 1.5–2.5)

## 2016-02-19 LAB — TSH: TSH: 0.42 mIU/L

## 2016-02-19 LAB — HEPATITIS C ANTIBODY: HCV Ab: NEGATIVE

## 2016-02-19 LAB — MICROALBUMIN / CREATININE URINE RATIO
Creatinine, Urine: 101 mg/dL (ref 20–320)
MICROALB/CREAT RATIO: 13 ug/mg{creat} (ref <30)
Microalb, Ur: 1.3 mg/dL

## 2016-02-19 LAB — VITAMIN D 25 HYDROXY (VIT D DEFICIENCY, FRACTURES): VIT D 25 HYDROXY: 67 ng/mL (ref 30–100)

## 2016-02-21 ENCOUNTER — Other Ambulatory Visit: Payer: Self-pay | Admitting: Internal Medicine

## 2016-03-03 ENCOUNTER — Other Ambulatory Visit: Payer: Self-pay

## 2016-03-03 MED ORDER — GLUCOSE BLOOD VI STRP: ORAL_STRIP | Status: DC

## 2016-03-04 ENCOUNTER — Other Ambulatory Visit: Payer: Self-pay

## 2016-03-04 ENCOUNTER — Encounter (INDEPENDENT_AMBULATORY_CARE_PROVIDER_SITE_OTHER): Payer: Medicare Other | Admitting: Ophthalmology

## 2016-03-04 DIAGNOSIS — H43813 Vitreous degeneration, bilateral: Secondary | ICD-10-CM

## 2016-03-04 DIAGNOSIS — H348122 Central retinal vein occlusion, left eye, stable: Secondary | ICD-10-CM

## 2016-03-04 DIAGNOSIS — H2513 Age-related nuclear cataract, bilateral: Secondary | ICD-10-CM

## 2016-03-04 DIAGNOSIS — I10 Essential (primary) hypertension: Secondary | ICD-10-CM

## 2016-03-04 DIAGNOSIS — H35033 Hypertensive retinopathy, bilateral: Secondary | ICD-10-CM

## 2016-03-04 MED ORDER — ONETOUCH ULTRA SYSTEM W/DEVICE KIT: PACK | Status: DC

## 2016-03-04 MED ORDER — GLUCOSE BLOOD VI STRP: ORAL_STRIP | Status: DC

## 2016-03-06 ENCOUNTER — Other Ambulatory Visit: Payer: Self-pay | Admitting: Internal Medicine

## 2016-03-10 ENCOUNTER — Other Ambulatory Visit: Payer: Self-pay

## 2016-03-10 MED ORDER — ONETOUCH ULTRASOFT LANCETS MISC: Status: DC

## 2016-03-21 ENCOUNTER — Other Ambulatory Visit: Payer: Self-pay | Admitting: *Deleted

## 2016-03-21 DIAGNOSIS — E039 Hypothyroidism, unspecified: Secondary | ICD-10-CM

## 2016-03-21 MED ORDER — LEVOTHYROXINE SODIUM 100 MCG PO TABS: ORAL_TABLET | ORAL | Status: DC

## 2016-04-15 ENCOUNTER — Encounter (INDEPENDENT_AMBULATORY_CARE_PROVIDER_SITE_OTHER): Payer: Medicare Other | Admitting: Ophthalmology

## 2016-04-15 DIAGNOSIS — H34812 Central retinal vein occlusion, left eye, with macular edema: Secondary | ICD-10-CM

## 2016-04-15 DIAGNOSIS — H2513 Age-related nuclear cataract, bilateral: Secondary | ICD-10-CM

## 2016-04-15 DIAGNOSIS — H43813 Vitreous degeneration, bilateral: Secondary | ICD-10-CM

## 2016-04-15 DIAGNOSIS — I10 Essential (primary) hypertension: Secondary | ICD-10-CM | POA: Diagnosis not present

## 2016-04-15 DIAGNOSIS — H35033 Hypertensive retinopathy, bilateral: Secondary | ICD-10-CM

## 2016-04-27 ENCOUNTER — Other Ambulatory Visit: Payer: Self-pay | Admitting: Internal Medicine

## 2016-05-05 ENCOUNTER — Other Ambulatory Visit: Payer: Self-pay | Admitting: Internal Medicine

## 2016-05-11 ENCOUNTER — Encounter: Payer: Self-pay | Admitting: *Deleted

## 2016-05-21 ENCOUNTER — Ambulatory Visit: Payer: Self-pay | Admitting: Physician Assistant

## 2016-05-27 ENCOUNTER — Encounter (INDEPENDENT_AMBULATORY_CARE_PROVIDER_SITE_OTHER): Payer: Medicare Other | Admitting: Ophthalmology

## 2016-05-27 DIAGNOSIS — H35033 Hypertensive retinopathy, bilateral: Secondary | ICD-10-CM | POA: Diagnosis not present

## 2016-05-27 DIAGNOSIS — H34812 Central retinal vein occlusion, left eye, with macular edema: Secondary | ICD-10-CM | POA: Diagnosis not present

## 2016-05-27 DIAGNOSIS — I10 Essential (primary) hypertension: Secondary | ICD-10-CM

## 2016-05-27 DIAGNOSIS — H2513 Age-related nuclear cataract, bilateral: Secondary | ICD-10-CM | POA: Diagnosis not present

## 2016-05-27 DIAGNOSIS — H43813 Vitreous degeneration, bilateral: Secondary | ICD-10-CM | POA: Diagnosis not present

## 2016-05-28 ENCOUNTER — Ambulatory Visit (INDEPENDENT_AMBULATORY_CARE_PROVIDER_SITE_OTHER): Payer: Medicare Other

## 2016-05-28 ENCOUNTER — Encounter: Payer: Self-pay | Admitting: Podiatry

## 2016-05-28 ENCOUNTER — Ambulatory Visit (INDEPENDENT_AMBULATORY_CARE_PROVIDER_SITE_OTHER): Payer: Medicare Other | Admitting: Podiatry

## 2016-05-28 VITALS — BP 167/96 | HR 71 | Resp 16 | Ht 64.0 in | Wt 245.0 lb

## 2016-05-28 DIAGNOSIS — M779 Enthesopathy, unspecified: Secondary | ICD-10-CM | POA: Diagnosis not present

## 2016-05-28 DIAGNOSIS — M21619 Bunion of unspecified foot: Secondary | ICD-10-CM

## 2016-05-28 DIAGNOSIS — R52 Pain, unspecified: Secondary | ICD-10-CM | POA: Diagnosis not present

## 2016-05-28 MED ORDER — TRIAMCINOLONE ACETONIDE 10 MG/ML IJ SUSP
10.0000 mg | Freq: Once | INTRAMUSCULAR | Status: AC
  Administered 2016-05-28: 10 mg

## 2016-05-28 NOTE — Progress Notes (Signed)
   Subjective:    Patient ID: Jocelyn Camacho, female    DOB: 06-18-1946, 70 y.o.   MRN: [id number]  HPI "This is an impromptu visit.  My right foot is hurting.  It hurts right at the big toe down into my arch.  It hurts when I put pressure on it.  When I put on a shoe it bothers me but mainly when I walk on it, it hurts.  I took some Advil last night, I put it on ice and I put some Horse Mane Ointment on it.  It has gradually gotten worse.  My toe is a little swollen."    Review of Systems  All other systems reviewed and are negative.      Objective:   Physical Exam        Assessment & Plan:

## 2016-05-28 NOTE — Progress Notes (Signed)
Subjective:     Patient ID: Jocelyn Camacho, female   DOB: 06/30/1946, 70 y.o.   MRN: [id number]  HPI patient states I'm getting a lot of pain around my big toe joint right with fluid buildup and it's making it hard for me to wear shoe gear and sheets at night can also hurt   Review of Systems  All other systems reviewed and are negative.      Objective:   Physical Exam  Constitutional: She is oriented to person, place, and time.  Cardiovascular: Intact distal pulses.   Musculoskeletal: Normal range of motion.  Neurological: She is oriented to person, place, and time.  Skin: Skin is warm.  Nursing note and vitals reviewed.  neurovascular status intact muscle strength adequate range of motion within normal limits with patient found to have a prominence around the first metatarsal head right inflammation and fluid buildup around the joint and pain when palpated. Patient's found have good digital perfusion and is well oriented 3     Assessment:     Inflammatory capsulitis first MPJ right with possibilities for gout and also structural bunion deformity right    Plan:     H&P and x-rays reviewed with patient. Today I injected around the first MPJ right 3 mg Kenalog 5 mg Xylocaine advised on wider shoes anti-inflammatories physical therapy and reviewed gout and what to watch out for. Patient will be seen back as needed  X-ray report indicated structural bunion with fluid around the joint surface

## 2016-06-03 ENCOUNTER — Encounter: Payer: Self-pay | Admitting: Physician Assistant

## 2016-06-03 ENCOUNTER — Ambulatory Visit (INDEPENDENT_AMBULATORY_CARE_PROVIDER_SITE_OTHER): Payer: Medicare Other | Admitting: Physician Assistant

## 2016-06-03 ENCOUNTER — Other Ambulatory Visit: Payer: Self-pay

## 2016-06-03 VITALS — BP 132/80 | HR 62 | Temp 97.9°F | Resp 16 | Ht 63.0 in | Wt 246.0 lb

## 2016-06-03 DIAGNOSIS — Z0001 Encounter for general adult medical examination with abnormal findings: Secondary | ICD-10-CM | POA: Diagnosis not present

## 2016-06-03 DIAGNOSIS — Z0000 Encounter for general adult medical examination without abnormal findings: Secondary | ICD-10-CM

## 2016-06-03 DIAGNOSIS — E782 Mixed hyperlipidemia: Secondary | ICD-10-CM

## 2016-06-03 DIAGNOSIS — E039 Hypothyroidism, unspecified: Secondary | ICD-10-CM

## 2016-06-03 DIAGNOSIS — E559 Vitamin D deficiency, unspecified: Secondary | ICD-10-CM | POA: Diagnosis not present

## 2016-06-03 DIAGNOSIS — R6889 Other general symptoms and signs: Secondary | ICD-10-CM

## 2016-06-03 DIAGNOSIS — H3530 Unspecified macular degeneration: Secondary | ICD-10-CM

## 2016-06-03 DIAGNOSIS — M25571 Pain in right ankle and joints of right foot: Secondary | ICD-10-CM | POA: Diagnosis not present

## 2016-06-03 DIAGNOSIS — I10 Essential (primary) hypertension: Secondary | ICD-10-CM | POA: Diagnosis not present

## 2016-06-03 DIAGNOSIS — F411 Generalized anxiety disorder: Secondary | ICD-10-CM | POA: Diagnosis not present

## 2016-06-03 DIAGNOSIS — N182 Chronic kidney disease, stage 2 (mild): Secondary | ICD-10-CM | POA: Diagnosis not present

## 2016-06-03 DIAGNOSIS — Z79899 Other long term (current) drug therapy: Secondary | ICD-10-CM

## 2016-06-03 DIAGNOSIS — G4733 Obstructive sleep apnea (adult) (pediatric): Secondary | ICD-10-CM

## 2016-06-03 DIAGNOSIS — E1122 Type 2 diabetes mellitus with diabetic chronic kidney disease: Secondary | ICD-10-CM

## 2016-06-03 LAB — CBC WITH DIFFERENTIAL/PLATELET
BASOS ABS: 90 {cells}/uL (ref 0–200)
Basophils Relative: 1 %
EOS ABS: 90 {cells}/uL (ref 15–500)
Eosinophils Relative: 1 %
HCT: 35.7 % (ref 35.0–45.0)
HEMOGLOBIN: 11.7 g/dL (ref 11.7–15.5)
LYMPHS ABS: 3060 {cells}/uL (ref 850–3900)
Lymphocytes Relative: 34 %
MCH: 27.3 pg (ref 27.0–33.0)
MCHC: 32.8 g/dL (ref 32.0–36.0)
MCV: 83.2 fL (ref 80.0–100.0)
MPV: 10.5 fL (ref 7.5–12.5)
Monocytes Absolute: 450 cells/uL (ref 200–950)
Monocytes Relative: 5 %
NEUTROS ABS: 5310 {cells}/uL (ref 1500–7800)
Neutrophils Relative %: 59 %
Platelets: 387 10*3/uL (ref 140–400)
RBC: 4.29 MIL/uL (ref 3.80–5.10)
RDW: 15 % (ref 11.0–15.0)
WBC: 9 10*3/uL (ref 3.8–10.8)

## 2016-06-03 LAB — MAGNESIUM: MAGNESIUM: 1.8 mg/dL (ref 1.5–2.5)

## 2016-06-03 LAB — HEPATIC FUNCTION PANEL
ALT: 13 U/L (ref 6–29)
AST: 13 U/L (ref 10–35)
Albumin: 4.2 g/dL (ref 3.6–5.1)
Alkaline Phosphatase: 77 U/L (ref 33–130)
BILIRUBIN INDIRECT: 0.5 mg/dL (ref 0.2–1.2)
Bilirubin, Direct: 0.1 mg/dL (ref <=0.2)
TOTAL PROTEIN: 7.3 g/dL (ref 6.1–8.1)
Total Bilirubin: 0.6 mg/dL (ref 0.2–1.2)

## 2016-06-03 LAB — BASIC METABOLIC PANEL WITH GFR
BUN: 21 mg/dL (ref 7–25)
CALCIUM: 9.5 mg/dL (ref 8.6–10.4)
CHLORIDE: 99 mmol/L (ref 98–110)
CO2: 31 mmol/L (ref 20–31)
CREATININE: 1 mg/dL — AB (ref 0.60–0.93)
GFR, EST AFRICAN AMERICAN: 66 mL/min (ref >=60)
GFR, Est Non African American: 57 mL/min — ABNORMAL LOW (ref >=60)
Glucose, Bld: 106 mg/dL — ABNORMAL HIGH (ref 65–99)
Potassium: 4.4 mmol/L (ref 3.5–5.3)
Sodium: 140 mmol/L (ref 135–146)

## 2016-06-03 LAB — LIPID PANEL
CHOLESTEROL: 151 mg/dL (ref 125–200)
HDL: 34 mg/dL — ABNORMAL LOW (ref >=46)
LDL Cholesterol: 88 mg/dL (ref <130)
TRIGLYCERIDES: 146 mg/dL (ref <150)
Total CHOL/HDL Ratio: 4.4 Ratio (ref <=5.0)
VLDL: 29 mg/dL (ref <30)

## 2016-06-03 LAB — URIC ACID: URIC ACID, SERUM: 9.9 mg/dL — AB (ref 2.5–7.0)

## 2016-06-03 LAB — HEMOGLOBIN A1C
Hgb A1c MFr Bld: 6.7 % — ABNORMAL HIGH (ref <5.7)
MEAN PLASMA GLUCOSE: 146 mg/dL

## 2016-06-03 LAB — TSH: TSH: 9.93 m[IU]/L — AB

## 2016-06-03 MED ORDER — PREDNISOLONE ACETATE 1 % OP SUSP: 1.0000 [drp] | Freq: Two times a day (BID) | OPHTHALMIC | Status: DC

## 2016-06-03 NOTE — Patient Instructions (Signed)

## 2016-06-03 NOTE — Progress Notes (Signed)
MEDICARE ANNUAL WELLNESS VISIT AND FOLLOW UP  Assessment:   1. Essential hypertension - continue medications, DASH diet, exercise and monitor at home. Call if greater than 130/80.  - CBC with Differential/Platelet - BASIC METABOLIC PANEL WITH GFR - Hepatic function panel  2. CKD stage 2 due to type 2 diabetes mellitus Discussed general issues about diabetes pathophysiology and management., Educational material distributed., Suggested low cholesterol diet., Encouraged aerobic exercise., Discussed foot care., Reminded to get yearly retinal exam. - Hemoglobin A1c - HM DIABETES FOOT EXAM  3. Hyperlipidemia -continue medications, check lipids, decrease fatty foods, increase activity.  - Lipid panel  4. Hypothyroidism, unspecified hypothyroidism type Hypothyroidism-check TSH level, continue medications the same, reminded to take on an empty stomach 30-70mns before food.  - TSH  5. Obesity, morbid Obesity with co morbidities- long discussion about weight loss, diet, and exercise  6. Vitamin D deficiency - Vit D  25 hydroxy (rtn osteoporosis monitoring)  7. Medication management - Magnesium  8. OSA (obstructive sleep apnea) Emphasized needs to be compliant with mask  9.  Anxiety disorder  10. Right foot pain Has seen podiatry, injection helped, check uric acid   Plan:   During the course of the visit the patient was educated and counseled about appropriate screening and preventive services including:    Pneumococcal vaccine   Influenza vaccine  Td vaccine  Screening electrocardiogram  Screening mammography  Bone densitometry screening  Colorectal cancer screening  Diabetes screening  Glaucoma screening  Nutrition counseling   Advanced directives: given info/requested  Conditions/risks identified: BMI: Discussed weight loss, diet, and increase physical activity.  Increase physical activity: AHA recommends 150 minutes of physical activity a week.   Medications reviewed Diabetes is at goal, ACE/ARB therapy: Yes. Urinary Incontinence is not an issue: discussed non pharmacology and pharmacology options.  Fall risk: low- discussed PT, home fall assessment, medications.    Subjective:   Jocelyn RUDNICKis a 70y.o. female who presents for Medicare Annual Wellness Visit and  OVER DUE 3 month follow up on hypertension, diabetes, hyperlipidemia, vitamin D def.  Date of last medicare wellness visit was 11/21/2014  Her blood pressure has NOT been controlled at home, today their BP is BP: 132/80 mmHg She does workout but not regularly. She denies chest pain, shortness of breath, dizziness.  She is on cholesterol medication and denies myalgias. Her cholesterol is at goal. The cholesterol last visit was:   Lab Results  Component Value Date   CHOL 142 02/18/2016   HDL 30* 02/18/2016   LDLCALC 86 02/18/2016   TRIG 131 02/18/2016   CHOLHDL 4.7 02/18/2016   She has been working on diet and exercise for diabetes with CKD stage II x 2013, she is on ACE, she is on bASA, she is metformin and glucotrol '5mg'$  TID and denies nausea, paresthesia of the feet and polydipsia. Last A1C in the office was:  Lab Results  Component Value Date   HGBA1C 6.6* 02/18/2016   Lab Results  Component Value Date   GFRAA 67 02/18/2016   She is on thyroid medication. Her medication was changed last visit, 1 tablet daily but 1.5 tablets Tues and Thursday.   Lab Results  Component Value Date   TSH 0.42 02/18/2016  .  Patient is on Vitamin D supplement. Lab Results  Component Value Date   VD25OH 610602/27/2017   She is seeing Dr. MZigmund Danielfor Mac Degen and gets shots.  She is on ambien PRN, she has OSA  and tried to use her sleep mask but does not use it every night. .  She went to see a podiatrist, may have had gout per patient, will check uric acid today. States her foot pain is better.  BMI is Body mass index is 43.59 kg/(m^2)., she is working on diet and  exercise. Wt Readings from Last 3 Encounters:  06/03/16 246 lb (111.585 kg)  05/28/16 245 lb (111.131 kg)  02/18/16 241 lb 9.6 oz (109.589 kg)    Names of Other Physician/Practitioners you currently use: 1. [facility]- here for primary care 2. Eye Appointment, 01/2016 3. Dr. Rosealee Albee, q 6 months Patient Care Team: [name], MD as PCP - General (Internal Medicine) Warden Fillers, MD as Consulting Physician (Optometry) [name], MD as Consulting Physician (Ophthalmology) Inda Castle, MD as Consulting Physician (Gastroenterology) Druscilla Brownie, MD as Consulting Physician (Dermatology) Allyn Kenner, MD (Dermatology)  Medication Review Current Outpatient Prescriptions on File Prior to Visit  Medication Sig Dispense Refill  . aspirin EC 81 MG tablet Take 1 tablet (81 mg total) by mouth daily.    Marland Kitchen atenolol (TENORMIN) 100 MG tablet TAKE ONE TABLET BY MOUTH EVERY DAY 90 tablet 1  . azelastine (OPTIVAR) 0.05 % ophthalmic solution instill 1 drop into both eyes twice a day 6 mL 11  . Azelastine HCl 0.15 % SOLN 1 spray each nostril twice daily as needed 30 mL 2  . b complex vitamins tablet Take 1 tablet by mouth daily.    Marland Kitchen BESIVANCE 0.6 % SUSP     . Blood Glucose Monitoring Suppl (ONE TOUCH ULTRA SYSTEM KIT) w/Device KIT TEST SUGARS AS DIRECTED. Dx Code: E 11.22 1 each 0  . calcium carbonate (OS-CAL) 600 MG TABS Take 600 mg by mouth 2 (two) times daily with a meal.    . Cholecalciferol (VITAMIN D3) 2000 UNITS TABS Take 5,000 mcg by mouth daily.    . diazepam (VALIUM) 5 MG tablet Take 1 tablet (5 mg total) by mouth at bedtime as needed for anxiety. 30 tablet 1  . enalapril (VASOTEC) 20 MG tablet TAKE ONE TABLET BY MOUTH ONCE DAILY FOR BLOOD PRESSURE 90 tablet 1  . Flaxseed, Linseed, (FLAXSEED OIL PO) Take by mouth daily.     . furosemide (LASIX) 40 MG tablet TAKE ONE TABLET BY MOUTH TWICE DAILY FOR BLOOD PRESSURE AND  FLUID 60 tablet 0   . glipiZIDE (GLUCOTROL) 5 MG tablet TAKE ONE-HALF TO 1  TABLET BY MOUTH THREE TIMES DAILY WITH MEALS AS DIRECTED. 270 tablet 0  . glucose blood test strip Use as instructed 100 each 3  . hyoscyamine (LEVSIN, ANASPAZ) 0.125 MG tablet TAKE ONE TABLET BY MOUTH EVERY 6 HOURS AS NEEDED 60 tablet 0  . Lancets (ONETOUCH ULTRASOFT) lancets Use as instructed Dx. Code: E11.22 100 each 12  . levothyroxine (SYNTHROID, LEVOTHROID) 100 MCG tablet 1.5 tabs 4 x week on T ThSS and 1 tab the other 3 days on MWF (= 9 tabs/week) 180 tablet 0  . naproxen (NAPROSYN) 500 MG tablet TAKE ONE TABLET BY MOUTH TWICE DAILY WITH MEALS 180 tablet 1  . potassium chloride (KLOR-CON 10) 10 MEQ tablet Take 1 tablet (10 mEq total) by mouth daily. 90 tablet 3  . metFORMIN (GLUCOPHAGE XR) 500 MG 24 hr tablet Take 2 tablets 2 x day for Diabetes 360 tablet 1   No current facility-administered medications on file prior to visit.    Current Problems (verified) Patient Active Problem  List   Diagnosis Date Noted  . Encounter for Medicare annual wellness exam 06/03/2016  . Generalized anxiety disorder 02/18/2016  . Macular degeneration 06/19/2015  . Medication management 03/29/2014  . Essential hypertension 11/16/2013  . Hyperlipidemia 11/16/2013  . DM (diabetes mellitus), type 2 with renal complications (HCC) 11/16/2013  . Vitamin D deficiency 11/16/2013  . Obesity, morbid (HCC) 11/16/2013  . OSA (obstructive sleep apnea) 11/16/2013  . Hypothyroidism 11/16/2013    Screening Tests Immunization History  Administered Date(s) Administered  . DT 06/19/2015  . DTaP 12/09/2004  . Pneumococcal Conjugate-13 12/19/2014  . Pneumococcal Polysaccharide-23 03/02/2009   Preventative care: Last colonoscopy: 03/2014 normal Last mammogram: 11/2015, CAT A Last pap smear/pelvic exam: 2012 remote DEXA: 11/2015 normal 2013 carotid US  Prior vaccinations: TD or Tdap: 2016  Influenza: 2015 declines Prevnar 13: 2015 Pneumococcal:  2010 Shingles/Zostavax: declines  Allergies Allergies  Allergen Reactions  . Penicillins Swelling   Surgical history Past Surgical History  Procedure Laterality Date  .  eye procedure      have treatment where a needle is stuck into left eye- since June 2014; now every six weeks  . Tubal ligation    . Dental examination under anesthesia     Family history Family History  Problem Relation Age of Onset  . Breast cancer Mother   . Renal Disease Mother   . Hypertension Sister   . Diabetes Sister   . Colon cancer Neg Hx   . Esophageal cancer Neg Hx   . Stomach cancer Neg Hx   . Rectal cancer Neg Hx    Risk Factors: Osteoporosis: postmenopausal estrogen deficiency and dietary calcium and/or vitamin D deficiency History of fracture in the past year: no  Tobacco Social History  Substance Use Topics  . Smoking status: Never Smoker   . Smokeless tobacco: Never Used  . Alcohol Use: 0.0 oz/week    0 Standard drinks or equivalent per week     Comment: once a month   She does not smoke.  Patient is not a former smoker. Are there smokers in your home (other than you)?  No  Alcohol Current alcohol use: social drinker  Caffeine Current caffeine use: coffee 1 /day  Exercise Current exercise: none  Nutrition/Diet Current diet: in general, an "unhealthy" diet  Cardiac risk factors: advanced age (older than 78 for men, 66 for women), diabetes mellitus, dyslipidemia, hypertension, obesity (BMI >= 30 kg/m2) and sedentary lifestyle.  Depression Screen  Q1: Over the past two weeks, have you felt down, depressed or hopeless? No  Q2: Over the past two weeks, have you felt little interest or pleasure in doing things? No  Have you lost interest or pleasure in daily life? No  Do you often feel hopeless? No  Do you cry easily over simple problems? No  Activities of Daily Living In your present state of health, do you have any difficulty performing the following activities?:   Driving? No Managing money?  No Feeding yourself? No Getting from bed to chair? No Climbing a flight of stairs? Yes Preparing food and eating?: No Bathing or showering? No Getting dressed: No Getting to the toilet? No Using the toilet:No Moving around from place to place: No In the past year have you fallen or had a near fall?:no  Vision Difficulties: Yes  Hearing Difficulties: No Do you often ask people to speak up or repeat themselves? yes Do you experience ringing or noises in your ears? No Do you have difficulty understanding soft  or whispered voices? No  Cognition  Do you feel that you have a problem with memory?No  Do you often misplace items? No  Do you feel safe at home?  Yes  Advanced directives Does patient have a [place]? No Does patient have a Living Will? No   Objective:   Blood pressure 132/80, pulse 62, temperature 97.9 F (36.6 C), temperature source Temporal, resp. rate 16, height '5\' 3"'$  (1.6 m), weight 246 lb (111.585 kg), SpO2 98 %. Body mass index is 43.59 kg/(m^2).  General appearance: alert, no distress, WD/WN,  female Cognitive Testing  Alert? Yes  Normal Appearance?Yes  Oriented to person? Yes  Place? Yes   Time? Yes  Recall of three objects?  Yes  Can perform simple calculations? Yes  Displays appropriate judgment?Yes  Can read the correct time from a watch face?Yes  HEENT: normocephalic, sclerae anicteric, TMs pearly, nares patent, no discharge or erythema, pharynx normal Oral cavity: MMM, no lesions Neck: supple, no lymphadenopathy, no thyromegaly, no masses Heart: RRR, normal S1, S2, no murmurs Lungs: CTA bilaterally, no wheezes, rhonchi, or rales Abdomen: +bs, soft, obese, non tender, non distended, no masses, no hepatomegaly, no splenomegaly Musculoskeletal: nontender, no swelling, no obvious deformity Extremities: trace bilateral edema, no cyanosis, no clubbing Pulses: 2+ symmetric, upper and lower  extremities, normal cap refill Neurological: alert, oriented x 3, CN2-12 intact, strength normal upper extremities and lower extremities, sensation decreased bilateral feet to ankle DTRs 2+ throughout, no cerebellar signs, gait antalgic and wide based likely due to body habitus Psychiatric: normal affect, behavior normal, pleasant  Breast: defer Gyn: defer Rectal: defer  Medicare Attestation I have personally reviewed: The patient's medical and social history Their use of alcohol, tobacco or illicit drugs Their current medications and supplements The patient's functional ability including ADLs,fall risks, home safety risks, cognitive, and hearing and visual impairment Diet and physical activities Evidence for depression or mood disorders  The patient's weight, height, BMI, and visual acuity have been recorded in the chart.  I have made referrals, counseling, and provided education to the patient based on review of the above and I have provided the patient with a written personalized care plan for preventive services.     Vicie Mutters, PA-C   06/03/2016

## 2016-06-04 ENCOUNTER — Other Ambulatory Visit: Payer: Self-pay | Admitting: Physician Assistant

## 2016-06-04 LAB — VITAMIN D 25 HYDROXY (VIT D DEFICIENCY, FRACTURES): Vit D, 25-Hydroxy: 78 ng/mL (ref 30–100)

## 2016-06-04 MED ORDER — COLCHICINE 0.6 MG PO TABS: 0.6000 mg | ORAL_TABLET | Freq: Every day | ORAL | Status: DC

## 2016-06-04 MED ORDER — ALLOPURINOL 300 MG PO TABS: 300.0000 mg | ORAL_TABLET | Freq: Every day | ORAL | Status: DC

## 2016-06-11 ENCOUNTER — Other Ambulatory Visit: Payer: Self-pay

## 2016-06-11 NOTE — Telephone Encounter (Signed)
[name] about her HEP C blood work, Pt states she doesn't have a problem w/ paying the bill she just didn't understand why she was not informed that she was being tested for HEP C. She states she felt that HEP C is a very serious diease & would just like to have been informed of that lab.

## 2016-06-26 ENCOUNTER — Other Ambulatory Visit: Payer: Self-pay | Admitting: Internal Medicine

## 2016-07-07 ENCOUNTER — Ambulatory Visit (INDEPENDENT_AMBULATORY_CARE_PROVIDER_SITE_OTHER): Payer: Medicare Other | Admitting: Physician Assistant

## 2016-07-07 ENCOUNTER — Encounter: Payer: Self-pay | Admitting: Physician Assistant

## 2016-07-07 VITALS — BP 130/66 | HR 88 | Temp 97.0°F | Resp 16 | Ht 63.0 in | Wt 242.8 lb

## 2016-07-07 DIAGNOSIS — N182 Chronic kidney disease, stage 2 (mild): Secondary | ICD-10-CM | POA: Diagnosis not present

## 2016-07-07 DIAGNOSIS — M109 Gout, unspecified: Secondary | ICD-10-CM

## 2016-07-07 DIAGNOSIS — E039 Hypothyroidism, unspecified: Secondary | ICD-10-CM

## 2016-07-07 DIAGNOSIS — E1122 Type 2 diabetes mellitus with diabetic chronic kidney disease: Secondary | ICD-10-CM | POA: Diagnosis not present

## 2016-07-07 LAB — CBC WITH DIFFERENTIAL/PLATELET
BASOS PCT: 1 %
Basophils Absolute: 89 cells/uL (ref 0–200)
EOS ABS: 178 {cells}/uL (ref 15–500)
EOS PCT: 2 %
HCT: 33.1 % — ABNORMAL LOW (ref 35.0–45.0)
HEMOGLOBIN: 11.1 g/dL — AB (ref 11.7–15.5)
Lymphocytes Relative: 35 %
Lymphs Abs: 3115 cells/uL (ref 850–3900)
MCH: 27.5 pg (ref 27.0–33.0)
MCHC: 33.5 g/dL (ref 32.0–36.0)
MCV: 81.9 fL (ref 80.0–100.0)
MONO ABS: 534 {cells}/uL (ref 200–950)
MONOS PCT: 6 %
MPV: 10.4 fL (ref 7.5–12.5)
NEUTROS PCT: 56 %
Neutro Abs: 4984 cells/uL (ref 1500–7800)
PLATELETS: 388 10*3/uL (ref 140–400)
RBC: 4.04 MIL/uL (ref 3.80–5.10)
RDW: 13.7 % (ref 11.0–15.0)
WBC: 8.9 10*3/uL (ref 3.8–10.8)

## 2016-07-07 LAB — BASIC METABOLIC PANEL WITH GFR
BUN: 28 mg/dL — AB (ref 7–25)
CHLORIDE: 98 mmol/L (ref 98–110)
CO2: 30 mmol/L (ref 20–31)
Calcium: 9.3 mg/dL (ref 8.6–10.4)
Creat: 1.09 mg/dL — ABNORMAL HIGH (ref 0.60–0.93)
GFR, EST AFRICAN AMERICAN: 59 mL/min — AB (ref >=60)
GFR, EST NON AFRICAN AMERICAN: 52 mL/min — AB (ref >=60)
Glucose, Bld: 141 mg/dL — ABNORMAL HIGH (ref 65–99)
POTASSIUM: 4.1 mmol/L (ref 3.5–5.3)
SODIUM: 138 mmol/L (ref 135–146)

## 2016-07-07 LAB — TSH: TSH: 0.3 m[IU]/L — AB

## 2016-07-07 LAB — URIC ACID: Uric Acid, Serum: 7.8 mg/dL — ABNORMAL HIGH (ref 2.5–7.0)

## 2016-07-07 NOTE — Patient Instructions (Signed)

## 2016-07-07 NOTE — Progress Notes (Signed)
Assessment and Plan: Type 2 diabetes mellitus with stage 2 chronic kidney disease, without long-term current use of insulin (HCC) -     CBC with Differential/Platelet -     BASIC METABOLIC PANEL WITH GFR -  continue to decrease carbs/sugars  Morbid obesity, unspecified obesity type ([place])  - Obesity with co morbidities- long discussion about weight loss, diet, and exercise  Gout without tophus, unspecified cause, unspecified chronicity, unspecified site -     Uric acid - Continue allopurinol  Hypothyroidism, unspecified hypothyroidism type -     TSH - Hypothyroidism-check TSH level, continue medications the same, reminded to take on an empty  stomach 30-84mns before food.   Future Appointments Date Time Provider DCarter 07/08/2016 8:30 AM JHayden Pedro MD [facility] None  09/05/2016 9:45 AM WUnk Pinto MD [facility] None  02/18/2017 3:00 PM AVicie Mutters PA-C [facility] None     HPI 70y.o.female presents for 1 month follow up for medication change.  Last visit she was complaining of foot pain, had an elevated uric acid, was started on allopurinol 3060m1/2 pill daily. She has history of CKD due to DM, we will monitor her BMP while on the allopurinol.  Lab Results  Component Value Date   LABURIC 9.9* 06/03/2016   Also her last TSH was abnormal, she was suppose to start on 1.59m18m days a week and 1 tab 2 days a week but did not, she is on 1.5 4 days a week and 1 3 days a week AND STARTED TO TAKE IT 1 HOUR BEFORE FOOD OR DRINK. Lab Results  Component Value Date   TSH 9.93* 06/03/2016   BMI is Body mass index is 43.02 kg/(m^2)., she is working on diet and exercise. Wt Readings from Last 3 Encounters:  07/07/16 242 lb 12.8 oz (110.133 kg)  06/03/16 246 lb (111.585 kg)  05/28/16 245 lb (111.131 kg)     Past Medical History  Diagnosis Date  . Hypertension   . Hyperlipidemia   . Obesity   . OSA (obstructive sleep apnea)     wears CPAP  . Vitamin D  deficiency   . Allergic rhinitis   . Unspecified hypothyroidism   . Type II or unspecified type diabetes mellitus with renal manifestations, not stated as uncontrolled   . Allergy   . Anxiety     with medical procedures     Allergies  Allergen Reactions  . Penicillins Swelling      Current Outpatient Prescriptions on File Prior to Visit  Medication Sig Dispense Refill  . allopurinol (ZYLOPRIM) 300 MG tablet Take 1 tablet (300 mg total) by mouth daily. 30 tablet 2  . aspirin EC 81 MG tablet Take 1 tablet (81 mg total) by mouth daily.    . aMarland Kitchenenolol (TENORMIN) 100 MG tablet TAKE ONE TABLET BY MOUTH EVERY DAY 90 tablet 1  . azelastine (OPTIVAR) 0.05 % ophthalmic solution instill 1 drop into both eyes twice a day 6 mL 11  . Azelastine HCl 0.15 % SOLN 1 spray each nostril twice daily as needed 30 mL 2  . BESIVANCE 0.6 % SUSP     . Blood Glucose Monitoring Suppl (ONE TOUCH ULTRA SYSTEM KIT) w/Device KIT TEST SUGARS AS DIRECTED. Dx Code: E 11.22 1 each 0  . Cholecalciferol (VITAMIN D3) 2000 UNITS TABS Take 5,000 mcg by mouth daily.    . colchicine 0.6 MG tablet Take 1 tablet (0.6 mg total) by mouth daily. 30 tablet 2  .  diazepam (VALIUM) 5 MG tablet Take 1 tablet (5 mg total) by mouth at bedtime as needed for anxiety. 30 tablet 1  . enalapril (VASOTEC) 20 MG tablet TAKE ONE TABLET BY MOUTH ONCE DAILY FOR BLOOD PRESSURE 90 tablet 1  . furosemide (LASIX) 40 MG tablet TAKE ONE TABLET BY MOUTH TWICE DAILY FOR BLOOD PRESSURE AND  FLUID 60 tablet 0  . glipiZIDE (GLUCOTROL) 5 MG tablet TAKE ONE-HALF TO 1  TABLET BY MOUTH THREE TIMES DAILY WITH MEALS AS DIRECTED. 270 tablet 0  . glucose blood test strip Use as instructed 100 each 3  . hyoscyamine (LEVSIN, ANASPAZ) 0.125 MG tablet TAKE ONE TABLET BY MOUTH EVERY 6 HOURS AS NEEDED 60 tablet 0  . Lancets (ONETOUCH ULTRASOFT) lancets Use as instructed Dx. Code: E11.22 100 each 12  . levothyroxine (SYNTHROID, LEVOTHROID) 100 MCG tablet 1.5 tabs 4 x week  on T ThSS and 1 tab the other 3 days on MWF (= 9 tabs/week) 180 tablet 0  . naproxen (NAPROSYN) 500 MG tablet TAKE ONE TABLET BY MOUTH TWICE DAILY WITH MEALS 180 tablet 1  . potassium chloride (KLOR-CON 10) 10 MEQ tablet Take 1 tablet (10 mEq total) by mouth daily. 90 tablet 3  . prednisoLONE acetate (PRED FORTE) 1 % ophthalmic suspension Place 1 drop into both eyes 2 (two) times daily. 5 mL 1  . metFORMIN (GLUCOPHAGE XR) 500 MG 24 hr tablet Take 2 tablets 2 x day for Diabetes 360 tablet 1   No current facility-administered medications on file prior to visit.    ROS: all negative except above.   Physical Exam: Filed Weights   07/07/16 0928  Weight: 242 lb 12.8 oz (110.133 kg)   BP 130/66 mmHg  Pulse 88  Temp(Src) 97 F (36.1 C) (Temporal)  Resp 16  Ht _0  (1.6 m)  Wt 242 lb 12.8 oz (110.133 kg)  BMI 43.02 kg/m2  SpO2 95% General Appearance: Well nourished, in no apparent distress. Eyes: PERRLA, EOMs, conjunctiva no swelling or erythema Sinuses: No Frontal/maxillary tenderness ENT/Mouth: Ext aud canals clear, TMs without erythema, bulging. No erythema, swelling, or exudate on post pharynx.  Tonsils not swollen or erythematous. Hearing normal.  Neck: Supple, thyroid normal.  Respiratory: Respiratory effort normal, BS equal bilaterally without rales, rhonchi, wheezing or stridor.  Cardio: RRR with no MRGs. Brisk peripheral pulses without edema.  Abdomen: Soft, + BS.  Non tender, no guarding, rebound, hernias, masses. Lymphatics: Non tender without lymphadenopathy.  Musculoskeletal: Full ROM, 5/5 strength, normal gait.  Skin: Warm, dry without rashes, lesions, ecchymosis.  Neuro: Cranial nerves intact. Normal muscle tone, no cerebellar symptoms. Sensation intact.  Psych: Awake and oriented X 3, normal affect, Insight and Judgment appropriate.     Vicie Mutters, PA-C 9:38 AM [facility]

## 2016-07-08 ENCOUNTER — Encounter (INDEPENDENT_AMBULATORY_CARE_PROVIDER_SITE_OTHER): Payer: Medicare Other | Admitting: Ophthalmology

## 2016-07-08 DIAGNOSIS — H34812 Central retinal vein occlusion, left eye, with macular edema: Secondary | ICD-10-CM

## 2016-07-08 DIAGNOSIS — I10 Essential (primary) hypertension: Secondary | ICD-10-CM

## 2016-07-08 DIAGNOSIS — H43813 Vitreous degeneration, bilateral: Secondary | ICD-10-CM | POA: Diagnosis not present

## 2016-07-08 DIAGNOSIS — H2513 Age-related nuclear cataract, bilateral: Secondary | ICD-10-CM

## 2016-07-08 DIAGNOSIS — H35033 Hypertensive retinopathy, bilateral: Secondary | ICD-10-CM | POA: Diagnosis not present

## 2016-07-29 ENCOUNTER — Other Ambulatory Visit: Payer: Self-pay | Admitting: Internal Medicine

## 2016-08-08 ENCOUNTER — Ambulatory Visit: Payer: Self-pay

## 2016-08-11 ENCOUNTER — Other Ambulatory Visit: Payer: Self-pay | Admitting: Internal Medicine

## 2016-08-15 ENCOUNTER — Ambulatory Visit (INDEPENDENT_AMBULATORY_CARE_PROVIDER_SITE_OTHER): Payer: Medicare Other

## 2016-08-15 VITALS — Ht 63.0 in | Wt 243.0 lb

## 2016-08-15 DIAGNOSIS — E039 Hypothyroidism, unspecified: Secondary | ICD-10-CM

## 2016-08-15 DIAGNOSIS — Z79899 Other long term (current) drug therapy: Secondary | ICD-10-CM | POA: Diagnosis not present

## 2016-08-15 DIAGNOSIS — M109 Gout, unspecified: Secondary | ICD-10-CM

## 2016-08-15 LAB — BASIC METABOLIC PANEL WITH GFR
BUN: 30 mg/dL — AB (ref 7–25)
CO2: 29 mmol/L (ref 20–31)
CREATININE: 1.02 mg/dL — AB (ref 0.60–0.93)
Calcium: 9.7 mg/dL (ref 8.6–10.4)
Chloride: 101 mmol/L (ref 98–110)
GFR, EST AFRICAN AMERICAN: 64 mL/min (ref >=60)
GFR, Est Non African American: 56 mL/min — ABNORMAL LOW (ref >=60)
GLUCOSE: 132 mg/dL — AB (ref 65–99)
POTASSIUM: 5 mmol/L (ref 3.5–5.3)
Sodium: 138 mmol/L (ref 135–146)

## 2016-08-15 LAB — TSH: TSH: 1.5 m[IU]/L

## 2016-08-15 LAB — URIC ACID: Uric Acid, Serum: 5.5 mg/dL (ref 2.5–7.0)

## 2016-08-15 NOTE — Progress Notes (Signed)
PT PRESENTS FOR LAB ONLY VISIT. PT TAKES HER THYROID MEDS AS FOLLOWS: 1.5 3 days a week and 1 pill 4 days a week

## 2016-08-19 ENCOUNTER — Encounter (INDEPENDENT_AMBULATORY_CARE_PROVIDER_SITE_OTHER): Payer: Medicare Other | Admitting: Ophthalmology

## 2016-08-19 DIAGNOSIS — H35033 Hypertensive retinopathy, bilateral: Secondary | ICD-10-CM | POA: Diagnosis not present

## 2016-08-19 DIAGNOSIS — H43813 Vitreous degeneration, bilateral: Secondary | ICD-10-CM | POA: Diagnosis not present

## 2016-08-19 DIAGNOSIS — H34812 Central retinal vein occlusion, left eye, with macular edema: Secondary | ICD-10-CM | POA: Diagnosis not present

## 2016-08-19 DIAGNOSIS — I10 Essential (primary) hypertension: Secondary | ICD-10-CM | POA: Diagnosis not present

## 2016-08-22 ENCOUNTER — Ambulatory Visit: Payer: Self-pay

## 2016-08-28 ENCOUNTER — Other Ambulatory Visit: Payer: Self-pay | Admitting: Internal Medicine

## 2016-08-28 ENCOUNTER — Ambulatory Visit: Payer: Self-pay | Admitting: Internal Medicine

## 2016-08-28 DIAGNOSIS — E039 Hypothyroidism, unspecified: Secondary | ICD-10-CM

## 2016-09-05 ENCOUNTER — Encounter: Payer: Self-pay | Admitting: Internal Medicine

## 2016-09-05 ENCOUNTER — Ambulatory Visit (INDEPENDENT_AMBULATORY_CARE_PROVIDER_SITE_OTHER): Payer: Medicare Other | Admitting: Internal Medicine

## 2016-09-05 VITALS — BP 124/82 | HR 80 | Temp 97.7°F | Resp 16 | Ht 63.0 in | Wt 242.8 lb

## 2016-09-05 DIAGNOSIS — N182 Chronic kidney disease, stage 2 (mild): Secondary | ICD-10-CM

## 2016-09-05 DIAGNOSIS — E1122 Type 2 diabetes mellitus with diabetic chronic kidney disease: Secondary | ICD-10-CM | POA: Diagnosis not present

## 2016-09-05 DIAGNOSIS — E039 Hypothyroidism, unspecified: Secondary | ICD-10-CM | POA: Diagnosis not present

## 2016-09-05 DIAGNOSIS — M109 Gout, unspecified: Secondary | ICD-10-CM | POA: Diagnosis not present

## 2016-09-05 DIAGNOSIS — E782 Mixed hyperlipidemia: Secondary | ICD-10-CM | POA: Diagnosis not present

## 2016-09-05 DIAGNOSIS — Z79899 Other long term (current) drug therapy: Secondary | ICD-10-CM | POA: Diagnosis not present

## 2016-09-05 DIAGNOSIS — E559 Vitamin D deficiency, unspecified: Secondary | ICD-10-CM | POA: Diagnosis not present

## 2016-09-05 DIAGNOSIS — I10 Essential (primary) hypertension: Secondary | ICD-10-CM

## 2016-09-05 LAB — BASIC METABOLIC PANEL WITH GFR
BUN: 22 mg/dL (ref 7–25)
CALCIUM: 9.6 mg/dL (ref 8.6–10.4)
CHLORIDE: 100 mmol/L (ref 98–110)
CO2: 28 mmol/L (ref 20–31)
Creat: 1.13 mg/dL — ABNORMAL HIGH (ref 0.60–0.93)
GFR, EST NON AFRICAN AMERICAN: 49 mL/min — AB (ref >=60)
GFR, Est African American: 57 mL/min — ABNORMAL LOW (ref >=60)
Glucose, Bld: 190 mg/dL — ABNORMAL HIGH (ref 65–99)
POTASSIUM: 3.9 mmol/L (ref 3.5–5.3)
SODIUM: 139 mmol/L (ref 135–146)

## 2016-09-05 LAB — TSH: TSH: 1.01 mIU/L

## 2016-09-05 LAB — LIPID PANEL
CHOLESTEROL: 134 mg/dL (ref 125–200)
HDL: 29 mg/dL — ABNORMAL LOW (ref >=46)
LDL CALC: 63 mg/dL (ref <130)
Total CHOL/HDL Ratio: 4.6 Ratio (ref <=5.0)
Triglycerides: 208 mg/dL — ABNORMAL HIGH (ref <150)
VLDL: 42 mg/dL — AB (ref <30)

## 2016-09-05 LAB — CBC WITH DIFFERENTIAL/PLATELET
BASOS PCT: 1 %
Basophils Absolute: 96 cells/uL (ref 0–200)
EOS PCT: 2 %
Eosinophils Absolute: 192 cells/uL (ref 15–500)
HCT: 35.6 % (ref 35.0–45.0)
HEMOGLOBIN: 11.5 g/dL — AB (ref 11.7–15.5)
LYMPHS ABS: 3072 {cells}/uL (ref 850–3900)
Lymphocytes Relative: 32 %
MCH: 27.1 pg (ref 27.0–33.0)
MCHC: 32.3 g/dL (ref 32.0–36.0)
MCV: 84 fL (ref 80.0–100.0)
MONO ABS: 480 {cells}/uL (ref 200–950)
MPV: 11.9 fL (ref 7.5–12.5)
Monocytes Relative: 5 %
NEUTROS ABS: 5760 {cells}/uL (ref 1500–7800)
Neutrophils Relative %: 60 %
Platelets: 449 10*3/uL — ABNORMAL HIGH (ref 140–400)
RBC: 4.24 MIL/uL (ref 3.80–5.10)
RDW: 15.9 % — ABNORMAL HIGH (ref 11.0–15.0)
WBC: 9.6 10*3/uL (ref 3.8–10.8)

## 2016-09-05 LAB — HEPATIC FUNCTION PANEL
ALK PHOS: 68 U/L (ref 33–130)
ALT: 12 U/L (ref 6–29)
AST: 13 U/L (ref 10–35)
Albumin: 4.2 g/dL (ref 3.6–5.1)
BILIRUBIN DIRECT: 0.2 mg/dL (ref <=0.2)
BILIRUBIN INDIRECT: 0.4 mg/dL (ref 0.2–1.2)
Total Bilirubin: 0.6 mg/dL (ref 0.2–1.2)
Total Protein: 7.2 g/dL (ref 6.1–8.1)

## 2016-09-05 LAB — URIC ACID: Uric Acid, Serum: 5 mg/dL (ref 2.5–7.0)

## 2016-09-05 LAB — MAGNESIUM: Magnesium: 1.7 mg/dL (ref 1.5–2.5)

## 2016-09-05 NOTE — Progress Notes (Signed)
[facility]                       [name], M.D.        Uvaldo Bristle. Silverio Lay, P.A.-C       Starlyn Skeans, P.A.-C  [facility]                [address] [place], [place] [address] Telephone [phone] Telefax [phone] _______________________________________________________     This very nice 70 y.o. MBF presents for quarterly  follow up with Hypertension, Hyperlipidemia, T2_NIDDM, Hypothyroidism  and Vitamin D Deficiency.  Also patient has hx/o Gout asymptomatic on treatment.      Patient is treated for HTN circa 1998 & BP has been controlled at home. Today's BP is  124/82. Patient has had no complaints of any cardiac type chest pain, palpitations, dyspnea/orthopnea/PND, dizziness, claudication, or dependent edema.     Hyperlipidemia is controlled with diet & meds. Patient denies myalgias or other med SE's. 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  Last A1c was not at goal: Lab Results  Component Value Date   HGBA1C 6.7 (H) 06/03/2016      Patient has been on Thyroid replacement since 2004. Further, the patient also has history of Vitamin D Deficiency and supplements vitamin D without any suspected side-effects. Last vitamin D was   Lab Results  Component Value Date   VD25OH 78 06/03/2016   Current Outpatient Prescriptions on File Prior to Visit  Medication Sig  . allopurinol  300 MG tablet Take 1 tab daily.  Marland Kitchen aspirin EC 81 MG  Take 1 tab daily.  Marland Kitchen atenolol 100 MG tablet TAKE ONE TAB EVERY DAY  . OPTIVAR ophth soln instill 1  drop into both eyes twice a day  . Azelastine HCl 0.15 % SOLN 1 spray each nostril twice daily as needed  . BESIVANCE 0.6 % SUSP   . VITAMIN D  Take 5,000 mcg  daily.  . colchicine 0.6 MG Take 1 tab daily.  . diazepam  5 MG Take 1 tab at bedtime as needed   . enalapril  20 MG TAKE ONE TAB ONCE DAILY  . furosemide  40 MG  TAKE 1 TAB TWICE A DAY  . glipiZIDE  5 MG tablet TAKE 1/2-1 TAB THREE TIMES A DAY   . Hyoscyamine 0.125 MG  TAKE ONE TAB EVERY 6 HRS AS NEEDED  . levothyroxine  100 MCG  TAKE 1.5 TABS 3x/wk and 1 tab x 4x/wk  . naproxen  500 MG TAKE ONE TAB TWICE DAILY   . potassium chloride  10 MEQ  Take 1 tab daily.  Marland Kitchen PRED FORTE 1 % ophth susp Place 1 drop into both eyes 2 (two) times daily.  . metFORMIN-XR 500 MG  Take 2 tablets 2 x day for Diabetes   Allergies  Allergen Reactions  . Penicillins Swelling   PMHx:   Past Medical History:  Diagnosis Date  .  Allergic rhinitis   . Allergy   . Anxiety    with medical procedures  . Hyperlipidemia   . Hypertension   . Obesity   . OSA (obstructive sleep apnea)    wears CPAP  . Type II or unspecified type diabetes mellitus with renal manifestations, not stated as uncontrolled   . Unspecified hypothyroidism   . Vitamin D deficiency    Immunization History  Administered Date(s) Administered  . DT 06/19/2015  . DTaP 12/09/2004  . Pneumococcal Conjugate-13 12/19/2014  . Pneumococcal Polysaccharide-23 03/02/2009   Past Surgical History:  Procedure Laterality Date  .  eye procedure     have treatment where a needle is stuck into left eye- since June 2014; now every six weeks  . DENTAL EXAMINATION UNDER ANESTHESIA    . TUBAL LIGATION     FHx:    Reviewed / unchanged  SHx:    Reviewed / unchanged  Systems Review:  Constitutional: Denies fever, chills, wt changes, headaches, insomnia, fatigue, night sweats, change in appetite. Eyes: Denies redness, blurred vision, diplopia, discharge, itchy, watery eyes.  ENT: Denies  discharge, congestion, post nasal drip, epistaxis, sore throat, earache, hearing loss, dental pain, tinnitus, vertigo, sinus pain, snoring.  CV: Denies chest pain, palpitations, irregular heartbeat, syncope, dyspnea, diaphoresis, orthopnea, PND, claudication or edema. Respiratory: denies cough, dyspnea, DOE, pleurisy, hoarseness, laryngitis, wheezing.  Gastrointestinal: Denies dysphagia, odynophagia, heartburn, reflux, water brash, abdominal pain or cramps, nausea, vomiting, bloating, diarrhea, constipation, hematemesis, melena, hematochezia  or hemorrhoids. Genitourinary: Denies dysuria, frequency, urgency, nocturia, hesitancy, discharge, hematuria or flank pain. Musculoskeletal: Denies arthralgias, myalgias, stiffness, jt. swelling, pain, limping or strain/sprain.  Skin: Denies pruritus, rash, hives, warts, acne, eczema or change in skin lesion(s). Neuro: No weakness, tremor, incoordination, spasms, paresthesia or pain. Psychiatric: Denies confusion, memory loss or sensory loss. Endo: Denies change in weight, skin or hair change.  Heme/Lymph: No excessive bleeding, bruising or enlarged lymph nodes.  Physical Exam BP 124/82   Pulse 80   Temp 97.7 F (36.5 C)   Resp 16   Ht 5\' 3"  (1.6 m)   Wt 242 lb 12.8 oz (110.1 kg)   BMI 43.01 kg/m   Appears over nourished with central obesity and in no distress.  Eyes: PERRLA, EOMs, conjunctiva no swelling or erythema. Sinuses: No frontal/maxillary tenderness ENT/Mouth: EAC's clear, TM's nl w/o erythema, bulging. Nares clear w/o erythema, swelling, exudates. Oropharynx clear without erythema or exudates. Oral hygiene is good. Tongue normal, non obstructing. Hearing intact.  Neck: Supple. Thyroid nl. Car 2+/2+ without bruits, nodes or JVD. Chest: Respirations nl with BS clear & equal w/o rales, rhonchi, wheezing or stridor.  Cor: Heart sounds normal w/ regular rate and rhythm without sig. murmurs, gallops, clicks, or rubs. Peripheral pulses normal  and equal  without edema.  Abdomen: Soft & bowel sounds normal. Non-tender w/o guarding, rebound, hernias, masses, or organomegaly.  Lymphatics: Unremarkable.  Musculoskeletal: Full ROM all peripheral extremities, joint stability, 5/5 strength, and normal gait.  Skin: Warm, dry without exposed rashes, lesions or ecchymosis apparent.  Neuro: Cranial nerves intact, reflexes equal bilaterally. Sensory-motor testing grossly intact. Tendon reflexes grossly intact.  Pysch: Alert & oriented x 3.  Insight and judgement nl & appropriate. No ideations.  Assessment and Plan:   1. Essential hypertension  - Continue medication, monitor blood pressure at home. Continue DASH diet. Reminder to go to the ER if any CP, SOB, nausea, dizziness, severe HA, changes vision/speech, left arm numbness and tingling and jaw pain. -  TSH  2. Hyperlipidemia  - Continue diet/meds, exercise,& lifestyle modifications. Continue monitor periodic cholesterol/liver & renal functions  - Lipid panel - TSH  3. Type 2 diabetes mellitus with stage 2 chronic kidney disease, without long-term current use of insulin (HCC)  - Hemoglobin A1c - Insulin, random  4. Vitamin D deficiency  - VITAMIN D 25 Hydroxy   5. Hypothyroidism,   - TSH  6. Gout   - Uric acid  7. Morbid obesity ([place])  - encouraged to read Dr Dimas Chyle books on "End of Dieting & Diabetes"  8. Medication management  - CBC with Differential/Platelet - BASIC METABOLIC PANEL WITH GFR - Hepatic function panel - Magnesium  - Continue diet, exercise, lifestyle modifications. Monitor appropriate labs. - Continue supplementation.  Recommended regular exercise, BP monitoring, weight control, and discussed med and SE's. Recommended labs to assess and monitor clinical status. Further disposition pending results of labs. Over 30 minutes of exam, counseling, chart review was performed

## 2016-09-05 NOTE — Patient Instructions (Signed)

## 2016-09-06 LAB — HEMOGLOBIN A1C
HEMOGLOBIN A1C: 7 % — AB (ref <5.7)
Mean Plasma Glucose: 154 mg/dL

## 2016-09-06 LAB — VITAMIN D 25 HYDROXY (VIT D DEFICIENCY, FRACTURES): Vit D, 25-Hydroxy: 86 ng/mL (ref 30–100)

## 2016-09-06 LAB — INSULIN, RANDOM: Insulin: 32.9 u[IU]/mL — ABNORMAL HIGH (ref 2.0–19.6)

## 2016-09-19 ENCOUNTER — Other Ambulatory Visit: Payer: Self-pay | Admitting: Physician Assistant

## 2016-09-19 ENCOUNTER — Other Ambulatory Visit: Payer: Self-pay | Admitting: Internal Medicine

## 2016-09-30 ENCOUNTER — Other Ambulatory Visit: Payer: Self-pay | Admitting: Physician Assistant

## 2016-09-30 ENCOUNTER — Encounter (INDEPENDENT_AMBULATORY_CARE_PROVIDER_SITE_OTHER): Payer: Medicare Other | Admitting: Ophthalmology

## 2016-09-30 DIAGNOSIS — I10 Essential (primary) hypertension: Secondary | ICD-10-CM | POA: Diagnosis not present

## 2016-09-30 DIAGNOSIS — H35033 Hypertensive retinopathy, bilateral: Secondary | ICD-10-CM | POA: Diagnosis not present

## 2016-09-30 DIAGNOSIS — H34812 Central retinal vein occlusion, left eye, with macular edema: Secondary | ICD-10-CM

## 2016-09-30 DIAGNOSIS — H43813 Vitreous degeneration, bilateral: Secondary | ICD-10-CM

## 2016-09-30 NOTE — Telephone Encounter (Signed)
Rx called into [facility].

## 2016-10-21 ENCOUNTER — Other Ambulatory Visit: Payer: Self-pay | Admitting: Internal Medicine

## 2016-10-29 ENCOUNTER — Other Ambulatory Visit: Payer: Self-pay

## 2016-10-29 ENCOUNTER — Other Ambulatory Visit: Payer: Self-pay | Admitting: *Deleted

## 2016-10-29 MED ORDER — LEVOTHYROXINE SODIUM 100 MCG PO TABS: ORAL_TABLET | ORAL | 0 refills | Status: DC

## 2016-10-29 MED ORDER — LEVOTHYROXINE SODIUM 100 MCG PO TABS: ORAL_TABLET | ORAL | 2 refills | Status: DC

## 2016-11-11 ENCOUNTER — Encounter (INDEPENDENT_AMBULATORY_CARE_PROVIDER_SITE_OTHER): Payer: Medicare Other | Admitting: Ophthalmology

## 2016-11-11 DIAGNOSIS — H34812 Central retinal vein occlusion, left eye, with macular edema: Secondary | ICD-10-CM | POA: Diagnosis not present

## 2016-11-11 DIAGNOSIS — H43813 Vitreous degeneration, bilateral: Secondary | ICD-10-CM | POA: Diagnosis not present

## 2016-11-11 DIAGNOSIS — H35033 Hypertensive retinopathy, bilateral: Secondary | ICD-10-CM | POA: Diagnosis not present

## 2016-11-11 DIAGNOSIS — I10 Essential (primary) hypertension: Secondary | ICD-10-CM | POA: Diagnosis not present

## 2016-12-18 ENCOUNTER — Ambulatory Visit: Payer: Self-pay | Admitting: Physician Assistant

## 2016-12-23 ENCOUNTER — Encounter (INDEPENDENT_AMBULATORY_CARE_PROVIDER_SITE_OTHER): Payer: Medicare Other | Admitting: Ophthalmology

## 2016-12-25 ENCOUNTER — Other Ambulatory Visit: Payer: Self-pay | Admitting: Internal Medicine

## 2016-12-26 ENCOUNTER — Encounter (INDEPENDENT_AMBULATORY_CARE_PROVIDER_SITE_OTHER): Payer: Medicare Other | Admitting: Ophthalmology

## 2016-12-29 ENCOUNTER — Encounter (INDEPENDENT_AMBULATORY_CARE_PROVIDER_SITE_OTHER): Payer: Medicare Other | Admitting: Ophthalmology

## 2016-12-29 DIAGNOSIS — H35033 Hypertensive retinopathy, bilateral: Secondary | ICD-10-CM

## 2016-12-29 DIAGNOSIS — H34832 Tributary (branch) retinal vein occlusion, left eye, with macular edema: Secondary | ICD-10-CM | POA: Diagnosis not present

## 2016-12-29 DIAGNOSIS — H34812 Central retinal vein occlusion, left eye, with macular edema: Secondary | ICD-10-CM

## 2016-12-29 DIAGNOSIS — I10 Essential (primary) hypertension: Secondary | ICD-10-CM

## 2016-12-29 DIAGNOSIS — H43813 Vitreous degeneration, bilateral: Secondary | ICD-10-CM

## 2017-01-01 ENCOUNTER — Other Ambulatory Visit: Payer: Self-pay | Admitting: Internal Medicine

## 2017-01-01 ENCOUNTER — Other Ambulatory Visit: Payer: Self-pay

## 2017-01-01 MED ORDER — LEVOTHYROXINE SODIUM 100 MCG PO TABS: ORAL_TABLET | ORAL | 2 refills | Status: DC

## 2017-01-14 ENCOUNTER — Other Ambulatory Visit: Payer: Self-pay | Admitting: Internal Medicine

## 2017-01-14 DIAGNOSIS — Z1231 Encounter for screening mammogram for malignant neoplasm of breast: Secondary | ICD-10-CM

## 2017-01-15 ENCOUNTER — Encounter: Payer: Self-pay | Admitting: Physician Assistant

## 2017-01-15 ENCOUNTER — Ambulatory Visit (INDEPENDENT_AMBULATORY_CARE_PROVIDER_SITE_OTHER): Payer: Medicare Other | Admitting: Physician Assistant

## 2017-01-15 VITALS — BP 124/80 | HR 74 | Temp 97.9°F | Resp 14 | Ht 63.0 in | Wt 244.4 lb

## 2017-01-15 DIAGNOSIS — E782 Mixed hyperlipidemia: Secondary | ICD-10-CM | POA: Diagnosis not present

## 2017-01-15 DIAGNOSIS — N182 Chronic kidney disease, stage 2 (mild): Secondary | ICD-10-CM

## 2017-01-15 DIAGNOSIS — Z79899 Other long term (current) drug therapy: Secondary | ICD-10-CM | POA: Diagnosis not present

## 2017-01-15 DIAGNOSIS — E1122 Type 2 diabetes mellitus with diabetic chronic kidney disease: Secondary | ICD-10-CM | POA: Diagnosis not present

## 2017-01-15 DIAGNOSIS — E559 Vitamin D deficiency, unspecified: Secondary | ICD-10-CM

## 2017-01-15 DIAGNOSIS — E039 Hypothyroidism, unspecified: Secondary | ICD-10-CM

## 2017-01-15 DIAGNOSIS — I10 Essential (primary) hypertension: Secondary | ICD-10-CM | POA: Diagnosis not present

## 2017-01-15 LAB — CBC WITH DIFFERENTIAL/PLATELET
BASOS ABS: 92 {cells}/uL (ref 0–200)
BASOS PCT: 1 %
Eosinophils Absolute: 92 cells/uL (ref 15–500)
Eosinophils Relative: 1 %
HEMATOCRIT: 37.5 % (ref 35.0–45.0)
HEMOGLOBIN: 11.9 g/dL (ref 11.7–15.5)
Lymphocytes Relative: 25 %
Lymphs Abs: 2300 cells/uL (ref 850–3900)
MCH: 27.2 pg (ref 27.0–33.0)
MCHC: 31.7 g/dL — ABNORMAL LOW (ref 32.0–36.0)
MCV: 85.8 fL (ref 80.0–100.0)
MPV: 11 fL (ref 7.5–12.5)
Monocytes Absolute: 552 cells/uL (ref 200–950)
Monocytes Relative: 6 %
NEUTROS ABS: 6164 {cells}/uL (ref 1500–7800)
Neutrophils Relative %: 67 %
Platelets: 357 10*3/uL (ref 140–400)
RBC: 4.37 MIL/uL (ref 3.80–5.10)
RDW: 15.4 % — AB (ref 11.0–15.0)
WBC: 9.2 10*3/uL (ref 3.8–10.8)

## 2017-01-15 LAB — TSH: TSH: 7.92 mIU/L — ABNORMAL HIGH

## 2017-01-15 MED ORDER — LEVOTHYROXINE SODIUM 125 MCG PO TABS: 125.0000 ug | ORAL_TABLET | Freq: Every day | ORAL | 1 refills | Status: DC

## 2017-01-15 NOTE — Patient Instructions (Signed)
Simple math prevails.    1st - exercise does not produce significant weight loss - at best one converts fat into muscle , "bulks up", loses inches, but usually stays "weight neutral"     2nd - think of your body weightas a check book: If you eat more calories than you burn up - you save money or gain weight .... Or if you spend more money than you put in the check book, ie burn up more calories than you eat, then you lose weight     3rd - if you walk or run 1 mile, you burn up 100 calories - you have to burn up 3,500 calories to lose 1 pound, ie you have to walk/run 35 miles to lose 1 measly pound. So if you want to lose 10 #, then you have to walk/run 350 miles, so.... clearly exercise is not the solution.     4. So if you consume 1,500 calories, then you have to burn up the equivalent of 15 miles to stay weight neutral - It also stands to reason that if you consume 1,500 cal/day and don't lose weight, then you must be burning up about 1,500 cals/day to stay weight neutral.     5. If you really want to lose weight, you must cut your calorie intake 300 calories /day and at that rate you should lose about 1 # every 3 days.   6. Please purchase Dr Fara Olden Fuhrman's book(s) "The End of Dieting" & "Eat to Live" . It has some great concepts and recipes.      We want weight loss that will last so you should lose 1-2 pounds a week.  THAT IS IT! Please pick THREE things a month to change. Once it is a habit check off the item. Then pick another three items off the list to become habits.  If you are already doing a habit on the list GREAT!  Cross that item off! o Don't drink your calories. Ie, alcohol, soda, fruit juice, and sweet tea.  o Drink more water. Drink a glass when you feel hungry or before each meal.  o Eat breakfast - Complex carb and protein (likeDannon light and fit yogurt, oatmeal, fruit, eggs, [place] bacon). o Measure your cereal.  Eat no more than one cup a day. (ie [place]) o Eat an apple  a day. o Add a vegetable a day. o Try a new vegetable a month. o Use Pam! Stop using oil or butter to cook. o Don't finish your plate or use smaller plates. o Share your dessert. o Eat sugar free Jello for dessert or frozen grapes. o Don't eat 2-3 hours before bed. o Switch to whole wheat bread, pasta, and brown rice. o Make healthier choices when you eat out. No fries! o Pick baked chicken, NOT fried. o Don't forget to SLOW DOWN when you eat. It is not going anywhere.  o Take the stairs. o Park far away in the parking lot o [employer] (or weights) for 10 minutes while watching TV. o Walk at work for 10 minutes during break. o Walk outside 1 time a week with your friend, kids, dog, or significant other. o Start a walking group at [place] the mall as much as you can tolerate.  o Keep a food diary. o Weigh yourself daily. o Walk for 15 minutes 3 days per week. o Cook at home more often and eat out less.  If life happens and you  go back to old habits, it is okay.  Just start over. You can do it!   If you experience chest pain, get short of breath, or tired during the exercise, please stop immediately and inform your doctor.   Before you even begin to attack a weight-loss plan, it pays to remember this: You are not fat. You have fat. Losing weight isn't about blame or shame; it's simply another achievement to accomplish. Dieting is like any other skill-you have to buckle down and work at it. As long as you act in a smart, reasonable way, you'll ultimately get where you want to be. Here are some weight loss pearls for you.  1. It's Not a Diet. It's a Lifestyle Thinking of a diet as something you're on and suffering through only for the short term doesn't work. To shed weight and keep it off, you need to make permanent changes to the way you eat. It's OK to indulge occasionally, of course, but if you cut calories temporarily and then revert to your old way of eating, you'll gain  back the weight quicker than you can say yo-yo. Use it to lose it. Research shows that one of the best predictors of long-term weight loss is how many pounds you drop in the first month. For that reason, nutritionists often suggest being stricter for the first two weeks of your new eating strategy to build momentum. Cut out added sugar and alcohol and avoid unrefined carbs. After that, figure out how you can reincorporate them in a way that's healthy and maintainable.  2. There's a Right Way to Exercise Working out burns calories and fat and boosts your metabolism by building muscle. But those trying to lose weight are notorious for overestimating the number of calories they burn and underestimating the amount they take in. Unfortunately, your system is biologically programmed to hold on to extra pounds and that means when you start exercising, your body senses the deficit and ramps up its hunger signals. If you're not diligent, you'll eat everything you burn and then some. Use it to lose it. Cardio gets all the exercise glory, but strength and interval training are the real heroes. They help you build lean muscle, which in turn increases your metabolism and calorie-burning ability 3. Don't Overreact to Mild Hunger Some people have a hard time losing weight because of hunger anxiety. To them, being hungry is bad-something to be avoided at all costs-so they carry snacks with them and eat when they don't need to. Others eat because they're stressed out or bored. While you never want to get to the point of being ravenous (that's when bingeing is likely to happen), a hunger pang, a craving, or the fact that it's 3:00 p.m. should not send you racing for the vending machine or obsessing about the energy bar in your purse. Ideally, you should put off eating until your stomach is growling and it's difficult to concentrate.  Use it to lose it. When you feel the urge to eat, use the HALT method. Ask yourself, Am I  really hungry? Or am I angry or anxious, lonely or bored, or tired? If you're still not certain, try the apple test. If you're truly hungry, an apple should seem delicious; if it doesn't, something else is going on. Or you can try drinking water and making yourself busy, if you are still hungry try a healthy snack.  4. Not All Calories Are Created Equal The mechanics of weight loss are pretty simple:  Take in fewer calories than you use for energy. But the kind of food you eat makes all the difference. Processed food that's high in saturated fat and refined starch or sugar can cause inflammation that disrupts the hormone signals that tell your brain you're full. The result: You eat a lot more.  Use it to lose it. Clean up your diet. Swap in whole, unprocessed foods, including vegetables, lean protein, and healthy fats that will fill you up and give you the biggest nutritional bang for your calorie buck. In a few weeks, as your brain starts receiving regular hunger and fullness signals once again, you'll notice that you feel less hungry overall and naturally start cutting back on the amount you eat.  5. Protein, Produce, and Plant-Based Fats Are Your Weight-Loss Trinity Here's why eating the three Ps regularly will help you drop pounds. Protein fills you up. You need it to build lean muscle, which keeps your metabolism humming so that you can torch more fat. People in a weight-loss program who ate double the recommended daily allowance for protein (about 110 grams for a 150-pound woman) lost 70 percent of their weight from fat, while people who ate the RDA lost only about 40 percent, one study found. Produce is packed with filling fiber. "It's very difficult to consume too many calories if you're eating a lot of vegetables. Example: Three cups of broccoli is a lot of food, yet only 93 calories. (Fruit is another story. It can be easy to overeat and can contain a lot of calories from sugar, so be sure to  monitor your intake.) Plant-based fats like olive oil and those in avocados and nuts are healthy and extra satiating.  Use it to lose it. Aim to incorporate each of the three Ps into every meal and snack. People who eat protein throughout the day are able to keep weight off, according to a study in the [place] of Clinical Nutrition. In addition to meat, poultry and seafood, good sources are beans, lentils, eggs, tofu, and yogurt. As for fat, keep portion sizes in check by measuring out salad dressing, oil, and nut butters (shoot for one to two tablespoons). Finally, eat veggies or a little fruit at every meal. People who did that consumed 308 fewer calories but didn't feel any hungrier than when they didn't eat more produce.  7. How You Eat Is As Important As What You Eat In order for your brain to register that you're full, you need to focus on what you're eating. Sit down whenever you eat, preferably at a table. Turn off the TV or computer, put down your phone, and look at your food. Smell it. Chew slowly, and don't put another bite on your fork until you swallow. When women ate lunch this attentively, they consumed 30 percent less when snacking later than those who listened to an audiobook at lunchtime, according to a study in the [place] of Nutrition. 8. Weighing Yourself Really Works The scale provides the best evidence about whether your efforts are paying off. Seeing the numbers tick up or down or stagnate is motivation to keep going-or to rethink your approach. A 2015 study at [facility] found that daily weigh-ins helped people lose more weight, keep it off, and maintain that loss, even after two years. Use it to lose it. Step on the scale at the same time every day for the best results. If your weight shoots up several pounds from one weigh-in to the next,  don't freak out. Eating a lot of salt the night before or having your period is the likely culprit. The number should  return to normal in a day or two. It's a steady climb that you need to do something about. 9. Too Much Stress and Too Little Sleep Are Your Enemies When you're tired and frazzled, your body cranks up the production of cortisol, the stress hormone that can cause carb cravings. Not getting enough sleep also boosts your levels of ghrelin, a hormone associated with hunger, while suppressing leptin, a hormone that signals fullness and satiety. People on a diet who slept only five and a half hours a night for two weeks lost 55 percent less fat and were hungrier than those who slept eight and a half hours, according to a study in the [place]. Use it to lose it. Prioritize sleep, aiming for seven hours or more a night, which research shows helps lower stress. And make sure you're getting quality zzz's. If a snoring spouse or a fidgety cat wakes you up frequently throughout the night, you may end up getting the equivalent of just four hours of sleep, according to a study from [facility]. Keep pets out of the bedroom, and use a white-noise app to drown out snoring. 10. You Will Hit a plateau-And You Can Bust Through It As you slim down, your body releases much less leptin, the fullness hormone.  If you're not strength training, start right now. Building muscle can raise your metabolism to help you overcome a plateau. To keep your body challenged and burning calories, incorporate new moves and more intense intervals into your workouts or add another sweat session to your weekly routine. Alternatively, cut an extra 100 calories or so a day from your diet. Now that you've lost weight, your body simply doesn't need as much fuel.

## 2017-01-15 NOTE — Progress Notes (Signed)
Assessment and Plan: Type 2 diabetes mellitus with stage 2 chronic kidney disease, without long-term current use of insulin (HCC) -     CBC with Differential/Platelet -     BASIC METABOLIC PANEL WITH GFR -  continue to decrease carbs/sugars  Morbid obesity, unspecified obesity type ([place])  - Obesity with co morbidities- long discussion about weight loss, diet, and exercise  Gout without tophus, unspecified cause, unspecified chronicity, unspecified site - Continue allopurinol  Hypothyroidism, unspecified hypothyroidism type -     TSH- switched 100 to 175mg daily - Hypothyroidism-check TSH level, continue medications the same, reminded to take on an empty  stomach 30-664ms before food.  -     levothyroxine (SYNTHROID) 125 MCG tablet; Take 1 tablet (125 mcg total) by mouth daily before breakfast  Essential hypertension - continue medications, DASH diet, exercise and monitor at home. Call if greater than 130/80.  -     CBC with Differential/Platelet -     BASIC METABOLIC PANEL WITH GFR -     Hepatic function panel  Hyperlipidemia -continue medications, check lipids, decrease fatty foods, increase activity.  -     Lipid panel  [place], morbid [place]- long discussion about weight loss, diet, and exercise -     Lipid panel -     Hemoglobin A1c  . Future Appointments Date Time Provider [place]2/20/2018 8:30 AM GI-[facility]G TOMO 03 GI-BCGMM GI-BREAST CE  02/23/2017 8:45 AM JoHayden PedroMD TRE-TRE None  03/23/2017 9:00 AM AmVicie MuttersPA-C GAAM-GAAIM None     HPI [id number].o.female presents for 3 month follow up for HTN, chol, DM with CKD. And TSH.  Last visit she was complaining of foot pain, had an elevated uric acid, was started on allopurinol 30025m/2 pill daily.  Lab Results  Component Value Date   LABURIC 5.0 09/05/2016   She has history of CKD due to DM, she is on ACE, she is on bASA, she does check her sugars at home, she denies DM poly's. Lab Results  Component  Value Date   HGBA1C 7.0 (H) 09/05/2016   Has increased to 8 bottles of water day.  Lab Results  Component Value Date   GFRAA 57 (L) 09/05/2016   Also her last TSH was normal, she takes 1 pill 3 days a week and 1.5 4 days a week.  Lab Results  Component Value Date   TSH 1.01 09/05/2016   BMI is Body mass index is 43.29 kg/m., she is working on diet and exercise. Wt Readings from Last 3 Encounters:  01/15/17 244 lb 6.4 oz (110.9 kg)  09/05/16 242 lb 12.8 oz (110.1 kg)  08/15/16 243 lb (110.2 kg)     Past Medical History:  Diagnosis Date  . Allergic rhinitis   . Allergy   . Anxiety    with medical procedures  . Hyperlipidemia   . Hypertension   . Obesity   . OSA (obstructive sleep apnea)    wears CPAP  . Type II or unspecified type diabetes mellitus with renal manifestations, not stated as uncontrolled(250.40)   . Unspecified hypothyroidism   . Vitamin D deficiency      Allergies  Allergen Reactions  . Penicillins Swelling      Current Outpatient Prescriptions on File Prior to Visit  Medication Sig Dispense Refill  . allopurinol (ZYLOPRIM) 300 MG tablet take 1 tablet by mouth once daily 90 tablet 1  . atenolol (TENORMIN) 100 MG tablet TAKE ONE TABLET BY  MOUTH ONCE DAILY 90 tablet 1  . azelastine (OPTIVAR) 0.05 % ophthalmic solution instill 1 drop into both eyes twice a day 6 mL 11  . Azelastine HCl 0.15 % SOLN 1 spray each nostril twice daily as needed 30 mL 2  . BESIVANCE 0.6 % SUSP     . Blood Glucose Monitoring Suppl (ONE TOUCH ULTRA SYSTEM KIT) w/Device KIT TEST SUGARS AS DIRECTED. Dx Code: E 11.22 1 each 0  . Cholecalciferol (VITAMIN D3) 2000 UNITS TABS Take 5,000 mcg by mouth daily.    . colchicine 0.6 MG tablet Take 1 tablet (0.6 mg total) by mouth daily. 30 tablet 2  . diazepam (VALIUM) 5 MG tablet TAKE ONE TABLET BY MOUTH AT BEDTIME AS NEEDED FOR ANXIETY 30 tablet 1  . enalapril (VASOTEC) 20 MG tablet TAKE ONE TABLET BY MOUTH ONCE DAILY FOR BLOOD PRESSURE  90 tablet 1  . furosemide (LASIX) 40 MG tablet TAKE ONE TABLET BY MOUTH TWICE DAILY FOR BLOOD PRESSURE AND  FLUID 180 tablet 1  . glipiZIDE (GLUCOTROL) 5 MG tablet TAKE 1/2-1 TABLET BY MOUTH THREE TIMES A DAY WITH MEALS AS DIRECTED 90 tablet 2  . glucose blood test strip Use as instructed 100 each 3  . hyoscyamine (LEVSIN, ANASPAZ) 0.125 MG tablet TAKE ONE TABLET BY MOUTH EVERY 6 HOURS AS NEEDED 60 tablet 0  . Lancets (ONETOUCH ULTRASOFT) lancets Use as instructed Dx. Code: E11.22 100 each 12  . levothyroxine (SYNTHROID, LEVOTHROID) 100 MCG tablet TAKE 1.5 TABS 4 TIMES A WEEK ON TUES, THURS, SAT AND SUN AND 1 TAB THE OTHER DAYS ON THE WEEK, MON, WED AND FRIDAY 65 tablet 2  . naproxen (NAPROSYN) 500 MG tablet TAKE ONE TABLET BY MOUTH TWICE DAILY WITH MEALS 180 tablet 1  . potassium chloride (K-DUR) 10 MEQ tablet take 1 tablet by mouth once daily 90 tablet 3  . prednisoLONE acetate (PRED FORTE) 1 % ophthalmic suspension Place 1 drop into both eyes 2 (two) times daily. 5 mL 1  . metFORMIN (GLUCOPHAGE XR) 500 MG 24 hr tablet Take 2 tablets 2 x day for Diabetes 360 tablet 1   No current facility-administered medications on file prior to visit.     ROS: all negative except above.   Physical Exam: Filed Weights   01/15/17 0950  Weight: 244 lb 6.4 oz (110.9 kg)   BP 124/80   Pulse 74   Temp 97.9 F (36.6 C)   Resp 14   Ht _0  (1.6 m)   Wt 244 lb 6.4 oz (110.9 kg)   SpO2 97%   BMI 43.29 kg/m  General Appearance: Well nourished, in no apparent distress. Eyes: PERRLA, EOMs, conjunctiva no swelling or erythema Sinuses: No Frontal/maxillary tenderness ENT/Mouth: Ext aud canals clear, TMs without erythema, bulging. No erythema, swelling, or exudate on post pharynx.  Tonsils not swollen or erythematous. Hearing normal.  Neck: Supple, thyroid normal.  Respiratory: Respiratory effort normal, BS equal bilaterally without rales, rhonchi, wheezing or stridor.  Cardio: RRR with no MRGs. Brisk  peripheral pulses without edema.  Abdomen: Soft, + BS.  Non tender, no guarding, rebound, hernias, masses. Lymphatics: Non tender without lymphadenopathy.  Musculoskeletal: Full ROM, 5/5 strength, normal gait.  Skin: Warm, dry without rashes, lesions, ecchymosis.  Neuro: Cranial nerves intact. Normal muscle tone, no cerebellar symptoms. Sensation intact.  Psych: Awake and oriented X 3, normal affect, Insight and Judgment appropriate.     Jocelyn Mutters, PA-C 10:11 AM [facility]

## 2017-01-16 LAB — BASIC METABOLIC PANEL WITH GFR
BUN: 27 mg/dL — AB (ref 7–25)
CO2: 26 mmol/L (ref 20–31)
Calcium: 10.1 mg/dL (ref 8.6–10.4)
Chloride: 97 mmol/L — ABNORMAL LOW (ref 98–110)
Creat: 1.22 mg/dL — ABNORMAL HIGH (ref 0.60–0.93)
GFR, EST AFRICAN AMERICAN: 52 mL/min — AB (ref >=60)
GFR, EST NON AFRICAN AMERICAN: 45 mL/min — AB (ref >=60)
GLUCOSE: 211 mg/dL — AB (ref 65–99)
POTASSIUM: 4.9 mmol/L (ref 3.5–5.3)
Sodium: 141 mmol/L (ref 135–146)

## 2017-01-16 LAB — HEPATIC FUNCTION PANEL
ALBUMIN: 4 g/dL (ref 3.6–5.1)
ALK PHOS: 82 U/L (ref 33–130)
ALT: 13 U/L (ref 6–29)
AST: 14 U/L (ref 10–35)
BILIRUBIN INDIRECT: 0.5 mg/dL (ref 0.2–1.2)
Bilirubin, Direct: 0.1 mg/dL (ref <=0.2)
TOTAL PROTEIN: 7.5 g/dL (ref 6.1–8.1)
Total Bilirubin: 0.6 mg/dL (ref 0.2–1.2)

## 2017-01-16 LAB — MAGNESIUM: Magnesium: 1.8 mg/dL (ref 1.5–2.5)

## 2017-01-16 LAB — VITAMIN D 25 HYDROXY (VIT D DEFICIENCY, FRACTURES): VIT D 25 HYDROXY: 71 ng/mL (ref 30–100)

## 2017-01-16 LAB — LIPID PANEL
CHOL/HDL RATIO: 4.9 ratio (ref <5.0)
CHOLESTEROL: 152 mg/dL (ref <200)
HDL: 31 mg/dL — AB (ref >50)
LDL Cholesterol: 74 mg/dL (ref <100)
Triglycerides: 236 mg/dL — ABNORMAL HIGH (ref <150)
VLDL: 47 mg/dL — ABNORMAL HIGH (ref <30)

## 2017-01-16 LAB — HEMOGLOBIN A1C
Hgb A1c MFr Bld: 7.4 % — ABNORMAL HIGH (ref <5.7)
MEAN PLASMA GLUCOSE: 166 mg/dL

## 2017-01-16 NOTE — Progress Notes (Signed)
Jocelyn Camacho DOB: Aug 30, 2046  Needs appt for: Recheck BMP and TSH nurse visit 1 month.

## 2017-01-16 NOTE — Progress Notes (Signed)
LVM for pt to return office call for LAB results. A message was sent to front office for : Recheck BMP and TSH nurse visit 1 month

## 2017-01-20 NOTE — Progress Notes (Signed)
Pt aware of lab results & voiced understanding of those results.

## 2017-02-02 ENCOUNTER — Other Ambulatory Visit: Payer: Self-pay | Admitting: Internal Medicine

## 2017-02-03 ENCOUNTER — Encounter: Payer: Self-pay | Admitting: Physician Assistant

## 2017-02-10 ENCOUNTER — Ambulatory Visit
Admission: RE | Admit: 2017-02-10 | Discharge: 2017-02-10 | Disposition: A | Discharge status: Home / Self Care | Payer: Medicare Other | Source: Ambulatory Visit | Attending: Internal Medicine | Admitting: Internal Medicine

## 2017-02-10 DIAGNOSIS — Z1231 Encounter for screening mammogram for malignant neoplasm of breast: Secondary | ICD-10-CM

## 2017-02-16 ENCOUNTER — Ambulatory Visit: Payer: Medicare Other | Admitting: *Deleted

## 2017-02-16 DIAGNOSIS — E039 Hypothyroidism, unspecified: Secondary | ICD-10-CM

## 2017-02-16 DIAGNOSIS — Z79899 Other long term (current) drug therapy: Secondary | ICD-10-CM

## 2017-02-16 LAB — BASIC METABOLIC PANEL
BUN: 30 mg/dL — ABNORMAL HIGH (ref 7–25)
CHLORIDE: 99 mmol/L (ref 98–110)
CO2: 29 mmol/L (ref 20–31)
Calcium: 9.8 mg/dL (ref 8.6–10.4)
Creat: 1.27 mg/dL — ABNORMAL HIGH (ref 0.60–0.93)
GLUCOSE: 189 mg/dL — AB (ref 65–99)
Potassium: 3.9 mmol/L (ref 3.5–5.3)
SODIUM: 139 mmol/L (ref 135–146)

## 2017-02-16 LAB — TSH: TSH: 5.13 m[IU]/L — AB

## 2017-02-16 NOTE — Progress Notes (Signed)
Patient states she takes Levothyroxine 125 mcg 1 daily and she takes the medication 1 hour prior to eating or drinking.

## 2017-02-17 ENCOUNTER — Other Ambulatory Visit: Payer: Self-pay | Admitting: Internal Medicine

## 2017-02-18 ENCOUNTER — Encounter: Payer: Self-pay | Admitting: Physician Assistant

## 2017-02-18 NOTE — Progress Notes (Signed)
Pt is still unhappy about being dehydrated & states she drinks at least 6 to 7 bottles a day. Pt would like to know if there is anything that you can tell her that will help her get hydrated. Please advise

## 2017-02-18 NOTE — Progress Notes (Signed)
Pt aware of lab results & voiced understanding of those results.

## 2017-02-23 ENCOUNTER — Encounter (INDEPENDENT_AMBULATORY_CARE_PROVIDER_SITE_OTHER): Payer: Medicare Other | Admitting: Ophthalmology

## 2017-02-23 DIAGNOSIS — H43813 Vitreous degeneration, bilateral: Secondary | ICD-10-CM | POA: Diagnosis not present

## 2017-02-23 DIAGNOSIS — H34812 Central retinal vein occlusion, left eye, with macular edema: Secondary | ICD-10-CM | POA: Diagnosis not present

## 2017-02-23 DIAGNOSIS — I10 Essential (primary) hypertension: Secondary | ICD-10-CM | POA: Diagnosis not present

## 2017-02-23 DIAGNOSIS — H35033 Hypertensive retinopathy, bilateral: Secondary | ICD-10-CM

## 2017-03-23 ENCOUNTER — Encounter: Payer: Self-pay | Admitting: Physician Assistant

## 2017-03-23 ENCOUNTER — Encounter: Payer: Self-pay | Admitting: *Deleted

## 2017-04-20 ENCOUNTER — Encounter (INDEPENDENT_AMBULATORY_CARE_PROVIDER_SITE_OTHER): Payer: Medicare Other | Admitting: Ophthalmology

## 2017-04-20 DIAGNOSIS — I10 Essential (primary) hypertension: Secondary | ICD-10-CM

## 2017-04-20 DIAGNOSIS — H35033 Hypertensive retinopathy, bilateral: Secondary | ICD-10-CM

## 2017-04-20 DIAGNOSIS — H43813 Vitreous degeneration, bilateral: Secondary | ICD-10-CM | POA: Diagnosis not present

## 2017-04-20 DIAGNOSIS — H34812 Central retinal vein occlusion, left eye, with macular edema: Secondary | ICD-10-CM

## 2017-04-20 NOTE — Progress Notes (Signed)
Complete Physical  Assessment and Plan: Essential hypertension - continue medications, DASH diet, exercise and monitor at home. Call if greater than 130/80.  - CBC with Differential - BASIC METABOLIC PANEL WITH GFR - Hepatic function panel - Urinalysis, Routine w reflex microscopic - Microalbumin / creatinine urine ratio - EKG 12-Lead  OSA (obstructive sleep apnea) Weight loss advised, needs to wear nightly, may benefit from new mask/titration   Hypothyroidism, unspecified hypothyroidism type Hypothyroidism-check TSH level, continue medications the same, reminded to take on an empty stomach 30-37mns before food.  - TSH   Hyperlipidemia -continue medications, check lipids, decrease fatty foods, increase activity.  - Lipid panel   Vitamin D deficiency - Vit D  25 hydroxy (rtn osteoporosis monitoring)   Obesity, morbid Obesity with co morbidities- long discussion about weight loss, diet, and exercise  Medication management - Magnesium  CKD stage 2 due to type 2 diabetes mellitus Discussed general issues about diabetes pathophysiology and management., Educational material distributed., Suggested low cholesterol diet., Encouraged aerobic exercise., Discussed foot care., Reminded to get yearly retinal exam. - Hemoglobin A1c  Anxiety Will take valium prior to procedures but would like to be able to take AS needed for sleep, long discussion about addictive nature and tolerance, will only take PRN.   Macular degeneration, unspecified laterality, unspecified type  continue follow up eye doctor  Chronic gout without tophus, unspecified cause, unspecified site Gout- recheck Uric acid as needed, Diet discussed, continue medications.  Encounter for general adult medical examination with abnormal findings   Discussed med's effects and SE's. Screening labs and tests as requested with regular follow-up as recommended. Future Appointments Date Time Provider [place]  06/01/2017 8:30 AM JHayden Pedro MD TRE-TRE None  04/22/2018 10:00 AM AVicie Mutters PA-C GAAM-GAAIM None    HPI  71y.o. AA female  presents for a complete physical.  She has had elevated blood pressure since . Her blood pressure has been controlled at home, today their BP is BP: 124/82 She does not workout due to knee pain.  She denies chest pain, shortness of breath, dizziness.  She is on cholesterol medication and denies myalgias. Her cholesterol is at goal. The cholesterol last visit was:   Lab Results  Component Value Date   CHOL 152 01/15/2017   HDL 31 (L) 01/15/2017   LDLCALC 74 01/15/2017   TRIG 236 (H) 01/15/2017   CHOLHDL 4.9 01/15/2017   She has had diabetes for 3 years. She has been working on diet and exercise for Diabetes with CKD stage II, she is on ACE, she is on bASA, She is on metformin and glucotrol 531mTID and denies paresthesia of the feet, polydipsia and polyuria. Checking sugars every other day, does not know name of machine.  Denies any hypoglycemia. Last A1C in the office was:  Lab Results  Component Value Date   HGBA1C 7.4 (H) 01/15/2017   Patient is on Vitamin D supplement.   Lab Results  Component Value Date   VD25OH 71941/25/2018     She is on thyroid medication. Her medication was changed last visit. She is on 12549mdaily except 1.5 on Sunday with water 1 hour before food. Patient denies feeling cold and cold intolerance, constipation, palpitations, weight loss, diarrhea  Lab Results  Component Value Date   TSH 5.13 (H) 02/16/2017   She is getting shots for Mac Degen with Dr. MatZigmund Danielaw him yesterday but did not need a shot.  Patient is on allopurinol for  gout and does not report a recent flare.  Lab Results  Component Value Date   LABURIC 5.0 09/05/2016   BMI is Body mass index is 43.9 kg/m., she is working on diet and exercise. She has OSA and does CPAP. She has been having knee pain, has seen [place] [facility] and on mobic, taking every other  day.  Wt Readings from Last 3 Encounters:  04/21/17 247 lb 12.8 oz (112.4 kg)  01/15/17 244 lb 6.4 oz (110.9 kg)  09/05/16 242 lb 12.8 oz (110.1 kg)    Current Medications:  Current Outpatient Prescriptions on File Prior to Visit  Medication Sig Dispense Refill  . allopurinol (ZYLOPRIM) 300 MG tablet take 1 tablet by mouth once daily 90 tablet 1  . atenolol (TENORMIN) 100 MG tablet TAKE ONE TABLET BY MOUTH ONCE DAILY 90 tablet 1  . azelastine (OPTIVAR) 0.05 % ophthalmic solution instill 1 drop into both eyes twice a day 6 mL 11  . Azelastine HCl 0.15 % SOLN 1 spray each nostril twice daily as needed 30 mL 2  . BESIVANCE 0.6 % SUSP     . Blood Glucose Monitoring Suppl (ONE TOUCH ULTRA SYSTEM KIT) w/Device KIT TEST SUGARS AS DIRECTED. Dx Code: E 11.22 1 each 0  . Cholecalciferol (VITAMIN D3) 2000 UNITS TABS Take 5,000 mcg by mouth daily.    . colchicine 0.6 MG tablet Take 1 tablet (0.6 mg total) by mouth daily. 30 tablet 2  . diazepam (VALIUM) 5 MG tablet TAKE ONE TABLET BY MOUTH AT BEDTIME AS NEEDED FOR ANXIETY 30 tablet 1  . enalapril (VASOTEC) 20 MG tablet TAKE ONE TABLET BY MOUTH ONCE DAILY FOR BLOOD PRESSURE 90 tablet 1  . furosemide (LASIX) 40 MG tablet TAKE ONE TABLET BY MOUTH TWICE DAILY FOR BLOOD PRESSURE AND  FLUID 180 tablet 1  . glipiZIDE (GLUCOTROL) 5 MG tablet TAKE 1/2-1 TABLET BY MOUTH THREE TIMES A DAY WITH MEALS AS DIRECTED 90 tablet 2  . glucose blood test strip Use as instructed 100 each 3  . hyoscyamine (LEVSIN, ANASPAZ) 0.125 MG tablet TAKE ONE TABLET BY MOUTH EVERY 6 HOURS AS NEEDED 60 tablet 0  . Lancets (ONETOUCH ULTRASOFT) lancets Use as instructed Dx. Code: E11.22 100 each 12  . levothyroxine (SYNTHROID) 125 MCG tablet Take 1 tablet (125 mcg total) by mouth daily before breakfast. 90 tablet 1  . naproxen (NAPROSYN) 500 MG tablet TAKE ONE TABLET BY MOUTH TWICE DAILY WITH MEALS 180 tablet 1  . potassium chloride (K-DUR) 10 MEQ tablet take 1 tablet by mouth once  daily 90 tablet 3  . prednisoLONE acetate (PRED FORTE) 1 % ophthalmic suspension INSTILL ONE DROP INTO EACH EYE TWICE DAILY 5 mL 0  . metFORMIN (GLUCOPHAGE XR) 500 MG 24 hr tablet Take 2 tablets 2 x day for Diabetes 360 tablet 1   No current facility-administered medications on file prior to visit.    Health Maintenance:   Immunization History  Administered Date(s) Administered  . DT 06/19/2015  . DTaP 12/09/2004  . Pneumococcal Conjugate-13 12/19/2014  . Pneumococcal Polysaccharide-23 03/02/2009   Last colonoscopy: 03/2014 normal Last mammogram: 01/2017, CAT A Last pap smear/pelvic exam: 2012 remote DEXA: 11/2015 normal Carotid US 2013 CXR 2009  Prior vaccinations: TD or Tdap: 2016 Influenza: declines Pneumococcal: 2010 declines Prevnar 13: 2015 Shingles/Zostavax: declines  Patient Care Team: [name], MD as PCP - General (Internal Medicine) Warden Fillers, MD as Consulting Physician (Optometry)- within 6 months [name], MD as Consulting Physician (  Ophthalmology)- 04/20/2017 Inda Castle, MD as Consulting Physician (Gastroenterology) Druscilla Brownie, MD as Consulting Physician (Dermatology) Allyn Kenner, MD (Dermatology)  Medical History:  Past Medical History:  Diagnosis Date  . Allergic rhinitis   . Allergy   . Anxiety    with medical procedures  . Hyperlipidemia   . Hypertension   . Obesity   . OSA (obstructive sleep apnea)    wears CPAP  . Type II or unspecified type diabetes mellitus with renal manifestations, not stated as uncontrolled(250.40)   . Unspecified hypothyroidism   . Vitamin D deficiency    Allergies Allergies  Allergen Reactions  . Penicillins Swelling    SURGICAL HISTORY She  has a past surgical history that includes  eye procedure; Tubal ligation; and Dental examination under anesthesia. FAMILY HISTORY Her family history includes Breast cancer in her mother; Diabetes in her sister; Hypertension in her sister;  Renal Disease in her mother. SOCIAL HISTORY She  reports that she has never smoked. She has never used smokeless tobacco. She reports that she drinks alcohol. She reports that she does not use drugs.  Review of Systems  Constitutional: Negative.   HENT: Negative.   Eyes: Negative.   Respiratory: Negative.   Cardiovascular: Negative.   Gastrointestinal: Negative.   Genitourinary: Negative for dysuria, flank pain, frequency, hematuria and urgency.  Musculoskeletal: Positive for joint pain (bilateral knee pain).  Skin: Negative.   Neurological: Negative.   Endo/Heme/Allergies: Negative.   Psychiatric/Behavioral: Negative for depression, hallucinations, memory loss, substance abuse and suicidal ideas. The patient is nervous/anxious. The patient does not have insomnia.     Physical Exam: Estimated body mass index is 43.9 kg/m as calculated from the following:   Height as of this encounter: _0  (1.6 m).   Weight as of this encounter: 247 lb 12.8 oz (112.4 kg). BP 124/82   Pulse 87   Temp 97.7 F (36.5 C)   Resp 14   Ht _1  (1.6 m)   Wt 247 lb 12.8 oz (112.4 kg)   SpO2 98%   BMI 43.90 kg/m  General Appearance: Well nourished, in no apparent distress.  Eyes: PERRLA, EOMs, conjunctiva no swelling or erythema, normal fundi and vessels.  Sinuses: No Frontal/maxillary tenderness  ENT/Mouth: Ext aud canals clear, normal light reflex with TMs without erythema, bulging. Good dentition. No erythema, swelling, or exudate on post pharynx. Tonsils not swollen or erythematous. Hearing normal.  Neck: Supple, thyroid normal. No bruits  Respiratory: Respiratory effort normal, BS equal bilaterally without rales, rhonchi, wheezing or stridor.  Cardio: RRR with 2/6 systolic murmurs RSB, without rubs or gallops. Brisk peripheral pulses without edema.  Chest: symmetric, with normal excursions and percussion.  Breasts: declines, states getting MGM Abdomen: Soft, nontender, no guarding, rebound,  hernias, masses, or organomegaly. .  Lymphatics: Non tender without lymphadenopathy.  Genitourinary: defer Musculoskeletal: Full ROM all peripheral extremities,5/5 strength, and normal gait.  Skin: Vitiligo bilateral hands and feet. Warm, dry without rashes, lesions, ecchymosis. Neuro: Cranial nerves intact, reflexes equal bilaterally. Normal muscle tone, no cerebellar symptoms. Sensation intact.  Psych: Awake and oriented X 3, normal affect, Insight and Judgment appropriate.   EKG: WNL no changes. AORTA SCAN: defer   Vicie Mutters 10:03 AM [facility]

## 2017-04-21 ENCOUNTER — Ambulatory Visit (INDEPENDENT_AMBULATORY_CARE_PROVIDER_SITE_OTHER): Payer: Medicare Other | Admitting: Physician Assistant

## 2017-04-21 ENCOUNTER — Encounter: Payer: Self-pay | Admitting: Physician Assistant

## 2017-04-21 VITALS — BP 124/82 | HR 87 | Temp 97.7°F | Resp 14 | Ht 63.0 in | Wt 247.8 lb

## 2017-04-21 DIAGNOSIS — F411 Generalized anxiety disorder: Secondary | ICD-10-CM

## 2017-04-21 DIAGNOSIS — Z0000 Encounter for general adult medical examination without abnormal findings: Secondary | ICD-10-CM | POA: Diagnosis not present

## 2017-04-21 DIAGNOSIS — E782 Mixed hyperlipidemia: Secondary | ICD-10-CM

## 2017-04-21 DIAGNOSIS — M1A9XX0 Chronic gout, unspecified, without tophus (tophi): Secondary | ICD-10-CM

## 2017-04-21 DIAGNOSIS — G4733 Obstructive sleep apnea (adult) (pediatric): Secondary | ICD-10-CM

## 2017-04-21 DIAGNOSIS — E559 Vitamin D deficiency, unspecified: Secondary | ICD-10-CM

## 2017-04-21 DIAGNOSIS — Z0001 Encounter for general adult medical examination with abnormal findings: Secondary | ICD-10-CM

## 2017-04-21 DIAGNOSIS — N182 Chronic kidney disease, stage 2 (mild): Secondary | ICD-10-CM

## 2017-04-21 DIAGNOSIS — H3530 Unspecified macular degeneration: Secondary | ICD-10-CM

## 2017-04-21 DIAGNOSIS — E1122 Type 2 diabetes mellitus with diabetic chronic kidney disease: Secondary | ICD-10-CM

## 2017-04-21 DIAGNOSIS — Z79899 Other long term (current) drug therapy: Secondary | ICD-10-CM

## 2017-04-21 DIAGNOSIS — E039 Hypothyroidism, unspecified: Secondary | ICD-10-CM

## 2017-04-21 DIAGNOSIS — I10 Essential (primary) hypertension: Secondary | ICD-10-CM

## 2017-04-21 NOTE — Patient Instructions (Signed)
Tumeric with black pepper extract is a great natural antiinflammatory that helps with arthritis and aches and pain. Can get from [employer] or any health food store. Need to take at least 800mg  twice a day with food.      Bad carbs also include fruit juice, alcohol, and sweet tea. These are empty calories that do not signal to your brain that you are full.   Please remember the good carbs are still carbs which convert into sugar. So please measure them out no more than 1/2-1 cup of rice, oatmeal, pasta, and beans  Veggies are however free foods! Pile them on.   Not all fruit is created equal. Please see the list below, the fruit at the bottom is higher in sugars than the fruit at the top. Please avoid all dried fruits.    Recommendations For Diabetic/Prediabetic Patients:   -  Take medications as prescribed  -  Recommend Dr Fara Olden Fuhrman's book "The End of Diabetes "  And "The End of Dieting"- Can get at  [email or web address] and encourage also get the Audio CD book  - AVOID Animal products, ie. Meat - red/white, Poultry and Dairy/especially cheese - Exercise at least 5 times a week for 30 minutes or preferably daily.  - No Smoking - Drink less than 2 drinks a day.  - Monitor your feet for sores - Have yearly Eye Exams - Recommend annual Flu vaccine  - Recommend Pneumovax and Prevnar vaccines - Shingles Vaccine (Zostavax) if over 65 y.o.  Goals:   - BMI less than 24 - Fasting sugar less than 130 or less than 150 if tapering medicines to lose weight  - Systolic BP less than 211  - Diastolic BP less than 80 - Bad LDL Cholesterol less than 70 - Triglycerides less than 150   Simple math prevails.    1st - exercise does not produce significant weight loss - at best one converts fat into muscle , "bulks up", loses inches, but usually stays "weight neutral"     2nd - think of your body weightas a check book: If you eat more calories than you burn up - you save money or gain weight .... Or if  you spend more money than you put in the check book, ie burn up more calories than you eat, then you lose weight     3rd - if you walk or run 1 mile, you burn up 100 calories - you have to burn up 3,500 calories to lose 1 pound, ie you have to walk/run 35 miles to lose 1 measly pound. So if you want to lose 10 #, then you have to walk/run 350 miles, so.... clearly exercise is not the solution.     4. So if you consume 1,500 calories, then you have to burn up the equivalent of 15 miles to stay weight neutral - It also stands to reason that if you consume 1,500 cal/day and don't lose weight, then you must be burning up about 1,500 cals/day to stay weight neutral.     5. If you really want to lose weight, you must cut your calorie intake 300 calories /day and at that rate you should lose about 1 # every 3 days.   6. Please purchase Dr Fara Olden Fuhrman's book(s) "The End of Dieting" & "Eat to Live" . It has some great concepts and recipes.

## 2017-04-22 LAB — URINALYSIS, ROUTINE W REFLEX MICROSCOPIC
Bilirubin Urine: NEGATIVE
Glucose, UA: NEGATIVE
HGB URINE DIPSTICK: NEGATIVE
KETONES UR: NEGATIVE
NITRITE: NEGATIVE
PH: 5.5 (ref 5.0–8.0)
Protein, ur: NEGATIVE
SPECIFIC GRAVITY, URINE: 1.011 (ref 1.001–1.035)

## 2017-04-22 LAB — URINALYSIS, MICROSCOPIC ONLY
Casts: NONE SEEN [LPF]
Crystals: NONE SEEN [HPF]
RBC / HPF: NONE SEEN RBC/HPF (ref <=2)
SQUAMOUS EPITHELIAL / LPF: NONE SEEN [HPF] (ref <=5)
YEAST: NONE SEEN [HPF]

## 2017-04-22 LAB — BASIC METABOLIC PANEL WITH GFR
BUN: 21 mg/dL (ref 7–25)
CALCIUM: 9 mg/dL (ref 8.6–10.4)
CO2: 29 mmol/L (ref 20–31)
CREATININE: 1.06 mg/dL — AB (ref 0.60–0.93)
Chloride: 102 mmol/L (ref 98–110)
GFR, Est African American: 61 mL/min (ref >=60)
GFR, Est Non African American: 53 mL/min — ABNORMAL LOW (ref >=60)
Glucose, Bld: 213 mg/dL — ABNORMAL HIGH (ref 65–99)
Potassium: 4.6 mmol/L (ref 3.5–5.3)
SODIUM: 139 mmol/L (ref 135–146)

## 2017-04-22 LAB — CBC WITH DIFFERENTIAL/PLATELET
BASOS ABS: 86 {cells}/uL (ref 0–200)
Basophils Relative: 1 %
EOS PCT: 3 %
Eosinophils Absolute: 258 cells/uL (ref 15–500)
HCT: 34.3 % — ABNORMAL LOW (ref 35.0–45.0)
Hemoglobin: 11.3 g/dL — ABNORMAL LOW (ref 11.7–15.5)
LYMPHS PCT: 30 %
Lymphs Abs: 2580 cells/uL (ref 850–3900)
MCH: 28.2 pg (ref 27.0–33.0)
MCHC: 32.9 g/dL (ref 32.0–36.0)
MCV: 85.5 fL (ref 80.0–100.0)
MONOS PCT: 6 %
MPV: 11.8 fL (ref 7.5–12.5)
Monocytes Absolute: 516 cells/uL (ref 200–950)
NEUTROS ABS: 5160 {cells}/uL (ref 1500–7800)
NEUTROS PCT: 60 %
PLATELETS: 370 10*3/uL (ref 140–400)
RBC: 4.01 MIL/uL (ref 3.80–5.10)
RDW: 14.5 % (ref 11.0–15.0)
WBC: 8.6 10*3/uL (ref 3.8–10.8)

## 2017-04-22 LAB — MAGNESIUM: Magnesium: 1.7 mg/dL (ref 1.5–2.5)

## 2017-04-22 LAB — LIPID PANEL
CHOL/HDL RATIO: 4.8 ratio (ref <5.0)
CHOLESTEROL: 143 mg/dL (ref <200)
HDL: 30 mg/dL — AB (ref >50)
LDL Cholesterol: 79 mg/dL (ref <100)
Triglycerides: 168 mg/dL — ABNORMAL HIGH (ref <150)
VLDL: 34 mg/dL — ABNORMAL HIGH (ref <30)

## 2017-04-22 LAB — HEPATIC FUNCTION PANEL
ALBUMIN: 3.8 g/dL (ref 3.6–5.1)
ALT: 14 U/L (ref 6–29)
AST: 14 U/L (ref 10–35)
Alkaline Phosphatase: 73 U/L (ref 33–130)
BILIRUBIN TOTAL: 0.6 mg/dL (ref 0.2–1.2)
Bilirubin, Direct: 0.1 mg/dL (ref <=0.2)
Indirect Bilirubin: 0.5 mg/dL (ref 0.2–1.2)
Total Protein: 6.9 g/dL (ref 6.1–8.1)

## 2017-04-22 LAB — HEMOGLOBIN A1C
HEMOGLOBIN A1C: 7.2 % — AB (ref <5.7)
MEAN PLASMA GLUCOSE: 160 mg/dL

## 2017-04-22 LAB — VITAMIN D 25 HYDROXY (VIT D DEFICIENCY, FRACTURES): VIT D 25 HYDROXY: 88 ng/mL (ref 30–100)

## 2017-04-22 LAB — TSH: TSH: 1.01 m[IU]/L

## 2017-04-22 LAB — MICROALBUMIN / CREATININE URINE RATIO
CREATININE, URINE: 88 mg/dL (ref 20–320)
Microalb Creat Ratio: 11 mcg/mg creat (ref <30)
Microalb, Ur: 1 mg/dL

## 2017-04-22 NOTE — Progress Notes (Signed)
Pt aware of lab results & voiced understanding of those results.

## 2017-06-01 ENCOUNTER — Encounter (INDEPENDENT_AMBULATORY_CARE_PROVIDER_SITE_OTHER): Payer: Medicare Other | Admitting: Ophthalmology

## 2017-06-01 DIAGNOSIS — H34812 Central retinal vein occlusion, left eye, with macular edema: Secondary | ICD-10-CM | POA: Diagnosis not present

## 2017-06-01 DIAGNOSIS — E113391 Type 2 diabetes mellitus with moderate nonproliferative diabetic retinopathy without macular edema, right eye: Secondary | ICD-10-CM

## 2017-06-01 DIAGNOSIS — I10 Essential (primary) hypertension: Secondary | ICD-10-CM

## 2017-06-01 DIAGNOSIS — H35033 Hypertensive retinopathy, bilateral: Secondary | ICD-10-CM

## 2017-06-01 DIAGNOSIS — E11319 Type 2 diabetes mellitus with unspecified diabetic retinopathy without macular edema: Secondary | ICD-10-CM

## 2017-06-01 DIAGNOSIS — H43813 Vitreous degeneration, bilateral: Secondary | ICD-10-CM

## 2017-06-19 ENCOUNTER — Other Ambulatory Visit: Payer: Self-pay | Admitting: Internal Medicine

## 2017-07-13 ENCOUNTER — Encounter (INDEPENDENT_AMBULATORY_CARE_PROVIDER_SITE_OTHER): Payer: Medicare Other | Admitting: Ophthalmology

## 2017-07-13 DIAGNOSIS — E113291 Type 2 diabetes mellitus with mild nonproliferative diabetic retinopathy without macular edema, right eye: Secondary | ICD-10-CM | POA: Diagnosis not present

## 2017-07-13 DIAGNOSIS — E11319 Type 2 diabetes mellitus with unspecified diabetic retinopathy without macular edema: Secondary | ICD-10-CM

## 2017-07-13 DIAGNOSIS — H35033 Hypertensive retinopathy, bilateral: Secondary | ICD-10-CM

## 2017-07-13 DIAGNOSIS — H34812 Central retinal vein occlusion, left eye, with macular edema: Secondary | ICD-10-CM | POA: Diagnosis not present

## 2017-07-13 DIAGNOSIS — H43813 Vitreous degeneration, bilateral: Secondary | ICD-10-CM

## 2017-07-13 DIAGNOSIS — I10 Essential (primary) hypertension: Secondary | ICD-10-CM

## 2017-07-29 ENCOUNTER — Ambulatory Visit: Payer: Self-pay | Admitting: Internal Medicine

## 2017-08-04 ENCOUNTER — Encounter: Payer: Self-pay | Admitting: Internal Medicine

## 2017-08-04 ENCOUNTER — Ambulatory Visit (INDEPENDENT_AMBULATORY_CARE_PROVIDER_SITE_OTHER): Payer: Medicare Other | Admitting: Internal Medicine

## 2017-08-04 VITALS — BP 116/78 | HR 72 | Temp 96.3°F | Resp 16 | Ht 63.0 in | Wt 231.4 lb

## 2017-08-04 DIAGNOSIS — E1122 Type 2 diabetes mellitus with diabetic chronic kidney disease: Secondary | ICD-10-CM | POA: Diagnosis not present

## 2017-08-04 DIAGNOSIS — N182 Chronic kidney disease, stage 2 (mild): Secondary | ICD-10-CM

## 2017-08-04 DIAGNOSIS — I10 Essential (primary) hypertension: Secondary | ICD-10-CM | POA: Diagnosis not present

## 2017-08-04 DIAGNOSIS — E559 Vitamin D deficiency, unspecified: Secondary | ICD-10-CM

## 2017-08-04 DIAGNOSIS — Z79899 Other long term (current) drug therapy: Secondary | ICD-10-CM

## 2017-08-04 DIAGNOSIS — R632 Polyphagia: Secondary | ICD-10-CM

## 2017-08-04 DIAGNOSIS — E039 Hypothyroidism, unspecified: Secondary | ICD-10-CM | POA: Diagnosis not present

## 2017-08-04 DIAGNOSIS — M1A9XX0 Chronic gout, unspecified, without tophus (tophi): Secondary | ICD-10-CM

## 2017-08-04 DIAGNOSIS — E782 Mixed hyperlipidemia: Secondary | ICD-10-CM

## 2017-08-04 LAB — CBC WITH DIFFERENTIAL/PLATELET
BASOS PCT: 1 %
Basophils Absolute: 77 cells/uL (ref 0–200)
EOS ABS: 154 {cells}/uL (ref 15–500)
EOS PCT: 2 %
HCT: 39.4 % (ref 35.0–45.0)
Hemoglobin: 12.3 g/dL (ref 11.7–15.5)
LYMPHS ABS: 2387 {cells}/uL (ref 850–3900)
Lymphocytes Relative: 31 %
MCH: 28.1 pg (ref 27.0–33.0)
MCHC: 31.2 g/dL — ABNORMAL LOW (ref 32.0–36.0)
MCV: 90 fL (ref 80.0–100.0)
MONOS PCT: 6 %
MPV: 11.7 fL (ref 7.5–12.5)
Monocytes Absolute: 462 cells/uL (ref 200–950)
Neutro Abs: 4620 cells/uL (ref 1500–7800)
Neutrophils Relative %: 60 %
PLATELETS: 405 10*3/uL — AB (ref 140–400)
RBC: 4.38 MIL/uL (ref 3.80–5.10)
RDW: 14.2 % (ref 11.0–15.0)
WBC: 7.7 10*3/uL (ref 3.8–10.8)

## 2017-08-04 LAB — TSH: TSH: 6.92 m[IU]/L — AB

## 2017-08-04 NOTE — Progress Notes (Signed)
This very nice 71 y.o. 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 Patient has OSA and admits that she infrequently uses her CPAP.     Patient is treated for HTN (1994) & BP has been controlled at home. Today's BP is at goal -  116/78. Patient has had no complaints of any cardiac type chest pain, palpitations, dyspnea/orthopnea/PND, dizziness, claudication, or dependent edema.     Hyperlipidemia is controlled with diet & meds. Patient denies myalgias or other med SE's. 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  She alleges CBG's range between 130-140 mg%. Patient admits Gluttonous overeating and last A1c was not at goal: Lab Results  Component Value Date   HGBA1C 7.2 (H) 04/21/2017      Further, the patient also has history of Vitamin D Deficiency ("42" on treatment in 2013) and supplements vitamin D without any suspected side-effects. Last vitamin D was at goal: Lab Results  Component Value Date   VD25OH 88 04/21/2017   Current Outpatient Prescriptions on File Prior to Visit  Medication Sig  . allopurinol (ZYLOPRIM) 300 MG tablet take 1 tablet by mouth once daily  . atenolol (TENORMIN) 100 MG tablet TAKE ONE TABLET BY MOUTH ONCE DAILY  . azelastine (OPTIVAR) 0.05 % ophthalmic solution instill 1 drop into both eyes twice a day  . Azelastine HCl 0.15 % SOLN 1 spray each nostril twice daily as needed  . BESIVANCE 0.6 % SUSP   . Blood Glucose Monitoring Suppl (ONE TOUCH ULTRA SYSTEM KIT) w/Device KIT TEST SUGARS AS DIRECTED. Dx Code: E 11.22  . Cholecalciferol (VITAMIN D3) 2000  UNITS TABS Take 5,000 mcg by mouth daily.  . colchicine 0.6 MG tablet Take 1 tablet (0.6 mg total) by mouth daily.  . diazepam (VALIUM) 5 MG tablet TAKE ONE TABLET BY MOUTH AT BEDTIME AS NEEDED FOR ANXIETY  . enalapril (VASOTEC) 20 MG tablet TAKE ONE TABLET BY MOUTH ONCE DAILY FOR BLOOD PRESSURE  . furosemide (LASIX) 40 MG tablet TAKE ONE TABLET BY MOUTH TWICE DAILY FOR BLOOD PRESSURE AND  FLUID  . glipiZIDE (GLUCOTROL) 5 MG tablet TAKE 1/2-1 TABLET BY MOUTH THREE TIMES A DAY WITH MEALS AS DIRECTED  . glucose blood test strip Use as instructed  . hyoscyamine (LEVSIN, ANASPAZ) 0.125 MG tablet TAKE ONE TABLET BY MOUTH EVERY 6 HOURS AS NEEDED  . Lancets (ONETOUCH ULTRASOFT) lancets Use as instructed Dx. Code: E11.22  . levothyroxine (SYNTHROID) 125 MCG tablet Take 1 tablet (125 mcg total) by mouth daily before breakfast.  . meloxicam (MOBIC) 15 MG tablet Take 15 mg by mouth daily.  . naproxen (NAPROSYN) 500 MG tablet TAKE ONE TABLET BY MOUTH TWICE DAILY WITH MEALS  . potassium chloride (K-DUR) 10 MEQ tablet take 1 tablet by mouth once daily  . prednisoLONE acetate (PRED FORTE) 1 % ophthalmic suspension INSTILL ONE DROP INTO EACH EYE TWICE DAILY  . metFORMIN (GLUCOPHAGE XR) 500 MG 24 hr tablet Take 2 tablets 2 x day for Diabetes   No current facility-administered medications on  file prior to visit.    Allergies  Allergen Reactions  . Penicillins Swelling   PMHx:   Past Medical History:  Diagnosis Date  . Allergic rhinitis   . Allergy   . Anxiety    with medical procedures  . Hyperlipidemia   . Hypertension   . Obesity   . OSA (obstructive sleep apnea)    wears CPAP  . Type II or unspecified type diabetes mellitus with renal manifestations, not stated as uncontrolled(250.40)   . Unspecified hypothyroidism   . Vitamin D deficiency    Immunization History  Administered Date(s) Administered  . DT 06/19/2015  . DTaP 12/09/2004  . Pneumococcal Conjugate-13 12/19/2014  . Pneumococcal  Polysaccharide-23 03/02/2009   Past Surgical History:  Procedure Laterality Date  .  eye procedure     have treatment where a needle is stuck into left eye- since June 2014; now every six weeks  . DENTAL EXAMINATION UNDER ANESTHESIA    . TUBAL LIGATION     FHx:    Reviewed / unchanged  SHx:    Reviewed / unchanged  Systems Review:  Constitutional: Denies fever, chills, wt changes, headaches, insomnia, fatigue, night sweats, change in appetite. Eyes: Denies redness, blurred vision, diplopia, discharge, itchy, watery eyes.  ENT: Denies discharge, congestion, post nasal drip, epistaxis, sore throat, earache, hearing loss, dental pain, tinnitus, vertigo, sinus pain, snoring.  CV: Denies chest pain, palpitations, irregular heartbeat, syncope, dyspnea, diaphoresis, orthopnea, PND, claudication or edema. Respiratory: denies cough, dyspnea, DOE, pleurisy, hoarseness, laryngitis, wheezing.  Gastrointestinal: Denies dysphagia, odynophagia, heartburn, reflux, water brash, abdominal pain or cramps, nausea, vomiting, bloating, diarrhea, constipation, hematemesis, melena, hematochezia  or hemorrhoids. Genitourinary: Denies dysuria, frequency, urgency, nocturia, hesitancy, discharge, hematuria or flank pain. Musculoskeletal: Denies arthralgias, myalgias, stiffness, jt. swelling, pain, limping or strain/sprain.  Skin: Denies pruritus, rash, hives, warts, acne, eczema or change in skin lesion(s). Neuro: No weakness, tremor, incoordination, spasms, paresthesia or pain. Psychiatric: Denies confusion, memory loss or sensory loss. Endo: Denies change in weight, skin or hair change.  Heme/Lymph: No excessive bleeding, bruising or enlarged lymph nodes.  Physical Exam  BP 116/78   Pulse 72   Temp (!) 96.3 F (35.7 C)   Resp 16   Ht '5\' 3"'$  (1.6 m)   Wt 231 lb 6.4 oz (105 kg)   BMI 40.99 kg/m   Appears well nourished, well groomed  and in no distress.  Eyes: PERRLA, EOMs, conjunctiva no swelling or  erythema. Sinuses: No frontal/maxillary tenderness ENT/Mouth: EAC's clear, TM's nl w/o erythema, bulging. Nares clear w/o erythema, swelling, exudates. Oropharynx clear without erythema or exudates. Oral hygiene is good. Tongue normal, non obstructing. Hearing intact.  Neck: Supple. Thyroid nl. Car 2+/2+ without bruits, nodes or JVD. Chest: Respirations nl with BS clear & equal w/o rales, rhonchi, wheezing or stridor.  Cor: Heart sounds normal w/ regular rate and rhythm without sig. murmurs, gallops, clicks or rubs. Peripheral pulses normal and equal  without edema.  Abdomen: Soft & bowel sounds normal. Non-tender w/o guarding, rebound, hernias, masses or organomegaly.  Lymphatics: Unremarkable.  Musculoskeletal: Full ROM all peripheral extremities, joint stability, 5/5 strength and normal gait.  Skin: Warm, dry without exposed rashes, lesions or ecchymosis apparent.  Neuro: Cranial nerves intact, reflexes equal bilaterally. Sensory-motor testing grossly intact. Tendon reflexes grossly intact.  Pysch: Alert & oriented x 3.  Insight and judgement nl & appropriate. No ideations.  Assessment and Plan:  1. Essential hypertension  - Continue medication, monitor blood pressure at  home.  - Continue DASH diet. Reminder to go to the ER if any CP,  SOB, nausea, dizziness, severe HA, changes vision/speech. - CBC with Differential/Platelet - BASIC METABOLIC PANEL WITH GFR - Magnesium - TSH  2. Hyperlipidemia, mixed  - Continue diet/meds, exercise,& lifestyle modifications.  - Continue monitor periodic cholesterol/liver & renal functions   - Hepatic function panel - Lipid panel - TSH  3. Type 2 diabetes mellitus with stage 2 chronic kidney disease, without long-term current use of insulin (HCC)  - Continue diet, exercise, lifestyle modifications.  - Monitor appropriate labs. - Hemoglobin A1c - Insulin, random  4. Vitamin D deficiency  - Continue supplementation.  - VITAMIN D 25  Hydroxy  5. Hypothyroidism  - TSH  6. Chronic gout   - Uric acid  7. Medication management  - CBC with Differential/Platelet - BASIC METABOLIC PANEL WITH GFR - Hepatic function panel - Magnesium - Lipid panel - TSH - Hemoglobin A1c - Insulin, random - VITAMIN D 25 Hydroxy - Uric acid  8. Gluttony       Discussed  regular exercise, BP monitoring, weight control to achieve/maintain BMI less than 25 and discussed med and SE's. Recommended labs to assess and monitor clinical status with further disposition pending results of labs. Over 30 minutes of exam, counseling, chart review was performed.

## 2017-08-04 NOTE — Patient Instructions (Signed)

## 2017-08-05 ENCOUNTER — Telehealth: Payer: Self-pay | Admitting: *Deleted

## 2017-08-05 ENCOUNTER — Encounter (HOSPITAL_COMMUNITY): Payer: Self-pay | Admitting: Emergency Medicine

## 2017-08-05 ENCOUNTER — Emergency Department (HOSPITAL_COMMUNITY)
Admission: EM | Admit: 2017-08-05 | Discharge: 2017-08-05 | Disposition: A | Discharge status: Home / Self Care | Payer: Medicare Other | Attending: Emergency Medicine | Admitting: Emergency Medicine

## 2017-08-05 DIAGNOSIS — N3000 Acute cystitis without hematuria: Secondary | ICD-10-CM | POA: Diagnosis not present

## 2017-08-05 DIAGNOSIS — E039 Hypothyroidism, unspecified: Secondary | ICD-10-CM | POA: Insufficient documentation

## 2017-08-05 DIAGNOSIS — Z79899 Other long term (current) drug therapy: Secondary | ICD-10-CM | POA: Diagnosis not present

## 2017-08-05 DIAGNOSIS — I10 Essential (primary) hypertension: Secondary | ICD-10-CM | POA: Insufficient documentation

## 2017-08-05 DIAGNOSIS — Z7984 Long term (current) use of oral hypoglycemic drugs: Secondary | ICD-10-CM | POA: Insufficient documentation

## 2017-08-05 DIAGNOSIS — E1165 Type 2 diabetes mellitus with hyperglycemia: Secondary | ICD-10-CM | POA: Insufficient documentation

## 2017-08-05 DIAGNOSIS — R739 Hyperglycemia, unspecified: Secondary | ICD-10-CM

## 2017-08-05 LAB — CBC
HEMATOCRIT: 36 % (ref 36.0–46.0)
HEMOGLOBIN: 11.9 g/dL — AB (ref 12.0–15.0)
MCH: 27.7 pg (ref 26.0–34.0)
MCHC: 33.1 g/dL (ref 30.0–36.0)
MCV: 83.9 fL (ref 78.0–100.0)
Platelets: 316 10*3/uL (ref 150–400)
RBC: 4.29 MIL/uL (ref 3.87–5.11)
RDW: 13.7 % (ref 11.5–15.5)
WBC: 6.3 10*3/uL (ref 4.0–10.5)

## 2017-08-05 LAB — LIPID PANEL
CHOL/HDL RATIO: 5 ratio — AB (ref <5.0)
Cholesterol: 154 mg/dL (ref <200)
HDL: 31 mg/dL — AB (ref >50)
LDL CALC: 59 mg/dL (ref <100)
Triglycerides: 319 mg/dL — ABNORMAL HIGH (ref <150)
VLDL: 64 mg/dL — ABNORMAL HIGH (ref <30)

## 2017-08-05 LAB — COMPREHENSIVE METABOLIC PANEL
ALT: 22 U/L (ref 14–54)
ANION GAP: 10 (ref 5–15)
AST: 21 U/L (ref 15–41)
Albumin: 4.1 g/dL (ref 3.5–5.0)
Alkaline Phosphatase: 93 U/L (ref 38–126)
BUN: 28 mg/dL — ABNORMAL HIGH (ref 6–20)
CHLORIDE: 94 mmol/L — AB (ref 101–111)
CO2: 29 mmol/L (ref 22–32)
Calcium: 9.3 mg/dL (ref 8.9–10.3)
Creatinine, Ser: 1.33 mg/dL — ABNORMAL HIGH (ref 0.44–1.00)
GFR, EST AFRICAN AMERICAN: 45 mL/min — AB (ref >60)
GFR, EST NON AFRICAN AMERICAN: 39 mL/min — AB (ref >60)
Glucose, Bld: 490 mg/dL — ABNORMAL HIGH (ref 65–99)
POTASSIUM: 4.2 mmol/L (ref 3.5–5.1)
SODIUM: 133 mmol/L — AB (ref 135–145)
Total Bilirubin: 0.9 mg/dL (ref 0.3–1.2)
Total Protein: 7.3 g/dL (ref 6.5–8.1)

## 2017-08-05 LAB — BASIC METABOLIC PANEL WITH GFR
BUN: 34 mg/dL — ABNORMAL HIGH (ref 7–25)
CHLORIDE: 88 mmol/L — AB (ref 98–110)
CO2: 24 mmol/L (ref 20–32)
Calcium: 9.4 mg/dL (ref 8.6–10.4)
Creat: 1.72 mg/dL — ABNORMAL HIGH (ref 0.60–0.93)
GFR, EST AFRICAN AMERICAN: 34 mL/min — AB (ref >=60)
GFR, Est Non African American: 29 mL/min — ABNORMAL LOW (ref >=60)
Glucose, Bld: 635 mg/dL (ref 65–99)
POTASSIUM: 5.1 mmol/L (ref 3.5–5.3)
SODIUM: 127 mmol/L — AB (ref 135–146)

## 2017-08-05 LAB — HEMOGLOBIN A1C: Hgb A1c MFr Bld: >14 % — ABNORMAL HIGH (ref <5.7)

## 2017-08-05 LAB — VITAMIN D 25 HYDROXY (VIT D DEFICIENCY, FRACTURES): Vit D, 25-Hydroxy: 61 ng/mL (ref 30–100)

## 2017-08-05 LAB — URINALYSIS, ROUTINE W REFLEX MICROSCOPIC
BILIRUBIN URINE: NEGATIVE
Glucose, UA: >=500 mg/dL — AB
Hgb urine dipstick: NEGATIVE
Ketones, ur: 5 mg/dL — AB
Nitrite: POSITIVE — AB
PH: 5 (ref 5.0–8.0)
Protein, ur: NEGATIVE mg/dL
SPECIFIC GRAVITY, URINE: 1.021 (ref 1.005–1.030)

## 2017-08-05 LAB — CBG MONITORING, ED
GLUCOSE-CAPILLARY: 500 mg/dL — AB (ref 65–99)
GLUCOSE-CAPILLARY: 398 mg/dL — AB (ref 65–99)
GLUCOSE-CAPILLARY: 362 mg/dL — AB (ref 65–99)

## 2017-08-05 LAB — INSULIN, RANDOM: INSULIN: 4.4 u[IU]/mL (ref 2.0–19.6)

## 2017-08-05 LAB — HEPATIC FUNCTION PANEL
ALK PHOS: 110 U/L (ref 33–130)
ALT: 16 U/L (ref 6–29)
AST: 13 U/L (ref 10–35)
Albumin: 4.1 g/dL (ref 3.6–5.1)
BILIRUBIN DIRECT: 0.2 mg/dL (ref <=0.2)
BILIRUBIN TOTAL: 0.7 mg/dL (ref 0.2–1.2)
Indirect Bilirubin: 0.5 mg/dL (ref 0.2–1.2)
Total Protein: 7.2 g/dL (ref 6.1–8.1)

## 2017-08-05 LAB — MAGNESIUM: MAGNESIUM: 2 mg/dL (ref 1.5–2.5)

## 2017-08-05 LAB — URIC ACID: Uric Acid, Serum: 7.4 mg/dL — ABNORMAL HIGH (ref 2.5–7.0)

## 2017-08-05 MED ORDER — SODIUM CHLORIDE 0.9 % IV BOLUS (SEPSIS)
1000.0000 mL | Freq: Once | INTRAVENOUS | Status: AC
  Administered 2017-08-05: 1000 mL via INTRAVENOUS

## 2017-08-05 MED ORDER — SULFAMETHOXAZOLE-TRIMETHOPRIM 800-160 MG PO TABS
1.0000 | ORAL_TABLET | Freq: Once | ORAL | Status: AC
  Administered 2017-08-05: 1 via ORAL
  Filled 2017-08-05: qty 1

## 2017-08-05 MED ORDER — SULFAMETHOXAZOLE-TRIMETHOPRIM 800-160 MG PO TABS: 1.0000 | ORAL_TABLET | Freq: Two times a day (BID) | ORAL | 0 refills | Status: DC

## 2017-08-05 NOTE — ED Provider Notes (Signed)
[facility] DEPT Provider Note   CSN: [id number] Arrival date & time: 08/05/17  3810     History   Chief Complaint Chief Complaint  Patient presents with  . Hyperglycemia    HPI Jocelyn Camacho is a 71 y.o. female.  The history is provided by the patient.  Hyperglycemia  Blood sugar level PTA:  "high" at the PCPs office Severity:  Moderate Onset quality:  Unable to specify Duration: unknown. Progression:  Unable to specify Chronicity:  Recurrent Diabetes status:  Controlled with oral medications Current diabetic therapy:  Metformin and Glypizide Context: not change in medication, not new diabetes diagnosis, not noncompliance, not recent change in diet and not recent illness   Relieved by:  None tried Ineffective treatments:  None tried Associated symptoms: polyuria   Associated symptoms: no abdominal pain, no altered mental status, no chest pain, no dehydration, no diaphoresis, no dizziness, no dysuria, no fatigue, no fever, no increased thirst and no vomiting   Risk factors: no hx of DKA     Past Medical History:  Diagnosis Date  . Allergic rhinitis   . Allergy   . Anxiety    with medical procedures  . Hyperlipidemia   . Hypertension   . Obesity   . OSA (obstructive sleep apnea)    wears CPAP  . Type II or unspecified type diabetes mellitus with renal manifestations, not stated as uncontrolled(250.40)   . Unspecified hypothyroidism   . Vitamin D deficiency     Patient Active Problem List   Diagnosis Date Noted  . Gluttony 08/04/2017  . Gout 07/07/2016  . Encounter for Medicare annual wellness exam 06/03/2016  . Generalized anxiety disorder 02/18/2016  . Macular degeneration 06/19/2015  . Medication management 03/29/2014  . Essential hypertension 11/16/2013  . Hyperlipidemia 11/16/2013  . DM (diabetes mellitus), type 2 with renal complications ([place]) 17/51/0258  . Vitamin D deficiency 11/16/2013  . Obesity, morbid ([place]) 11/16/2013  . OSA (obstructive  sleep apnea) 11/16/2013  . Hypothyroidism 11/16/2013    Past Surgical History:  Procedure Laterality Date  .  eye procedure     have treatment where a needle is stuck into left eye- since June 2014; now every six weeks  . DENTAL EXAMINATION UNDER ANESTHESIA    . TUBAL LIGATION      OB History    No data available       Home Medications    Prior to Admission medications   Medication Sig Start Date End Date Taking? Authorizing Provider  allopurinol (ZYLOPRIM) 300 MG tablet take 1 tablet by mouth once daily Patient taking differently: Take 300 mg by mouth once daily 09/19/16  Yes [name], MD  atenolol (TENORMIN) 100 MG tablet TAKE ONE TABLET BY MOUTH ONCE DAILY Patient taking differently: TAKE 100 mg BY MOUTH ONCE DAILY 10/21/16  Yes [name], MD  BESIVANCE 0.6 % SUSP Place 1 drop into the left eye 4 (four) times daily.  09/30/13  Yes [provider]  Cholecalciferol (VITAMIN D3) 5000 units CAPS Take 10,000 Units by mouth daily.   Yes [provider]  colchicine 0.6 MG tablet Take 1 tablet (0.6 mg total) by mouth daily. 06/04/16  Yes Vicie Mutters, PA-C  diazepam (VALIUM) 5 MG tablet TAKE ONE TABLET BY MOUTH AT BEDTIME AS NEEDED FOR ANXIETY Patient taking differently: TAKE 5 mg BY MOUTH AT BEDTIME AS NEEDED FOR ANXIETY 09/30/16  Yes Vicie Mutters, PA-C  enalapril (VASOTEC) 20 MG tablet TAKE ONE TABLET BY MOUTH ONCE DAILY  FOR BLOOD PRESSURE Patient taking differently: TAKE 20 mg BY MOUTH ONCE DAILY FOR BLOOD PRESSURE 01/01/17  Yes [name], MD  ferrous fumarate (HEMOCYTE - 106 MG FE) 325 (106 Fe) MG TABS tablet Take 325 mg of iron by mouth daily.    Yes [provider]  furosemide (LASIX) 40 MG tablet TAKE ONE TABLET BY MOUTH TWICE DAILY FOR BLOOD PRESSURE AND  FLUID Patient taking differently: TAKE 40 mg BY MOUTH TWICE DAILY FOR BLOOD PRESSURE AND FLUID 06/19/17  Yes Vicie Mutters, PA-C  glipiZIDE (GLUCOTROL) 5 MG tablet TAKE  1/2-1 TABLET BY MOUTH THREE TIMES A DAY WITH MEALS AS DIRECTED Patient taking differently: TAKE 2.5 to 5 mg BY MOUTH THREE TIMES A DAY WITH MEALS AS DIRECTED 08/11/16  Yes Forcucci, Courtney, PA-C  hyoscyamine (LEVSIN, ANASPAZ) 0.125 MG tablet TAKE ONE TABLET BY MOUTH EVERY 6 HOURS AS NEEDED Patient taking differently: TAKE 0.125 mg BY MOUTH EVERY 6 HOURS AS NEEDED for stomach pain 01/23/16  Yes Forcucci, Courtney, PA-C  levothyroxine (SYNTHROID) 125 MCG tablet Take 1 tablet (125 mcg total) by mouth daily before breakfast. 01/15/17 01/15/18 Yes Vicie Mutters, PA-C  meloxicam (MOBIC) 15 MG tablet Take 15 mg by mouth daily as needed for pain.   Yes [provider]  metFORMIN (GLUCOPHAGE XR) 500 MG 24 hr tablet Take 2 tablets 2 x day for Diabetes Patient taking differently: Take 1,000 mg by mouth 2 (two) times daily.  10/22/15 08/05/17 Yes [name], MD  naproxen (NAPROSYN) 500 MG tablet TAKE ONE TABLET BY MOUTH TWICE DAILY WITH MEALS Patient taking differently: TAKE 500 mg BY MOUTH TWICE DAILY WITH MEALS as needed for pain 02/21/16  Yes [name], MD  OVER THE COUNTER MEDICATION Take 1 tablet by mouth daily. Tumeric with Black Pepper   Yes [provider]  potassium chloride (K-DUR) 10 MEQ tablet take 1 tablet by mouth once daily Patient taking differently: Take 10 meq by mouth once daily 12/25/16  Yes [name], MD  prednisoLONE acetate (PRED FORTE) 1 % ophthalmic suspension INSTILL ONE DROP INTO EACH EYE TWICE DAILY Patient taking differently: INSTILL ONE DROP INTO EACH EYE once daily 02/02/17  Yes [name], MD  azelastine (OPTIVAR) 0.05 % ophthalmic solution instill 1 drop into both eyes twice a day Patient not taking: Reported on 08/05/2017 05/02/14   Vicie Mutters, PA-C  Azelastine HCl 0.15 % SOLN 1 spray each nostril twice daily as needed Patient taking differently: Place 1 spray into both nostrils daily as needed (CONGESTION). 1 spray each nostril twice  daily as needed 11/21/14   Vicie Mutters, PA-C  Blood Glucose Monitoring Suppl (ONE TOUCH ULTRA SYSTEM KIT) w/Device KIT TEST SUGARS AS DIRECTED. Dx Code: E 11.22 03/04/16   Vicie Mutters, PA-C  glucose blood test strip Use as instructed 03/04/16   Vicie Mutters, PA-C  Lancets [facility] ULTRASOFT) lancets Use as instructed Dx. Code: E11.22 03/10/16   Vicie Mutters, PA-C  sulfamethoxazole-trimethoprim (BACTRIM DS,SEPTRA DS) 800-160 MG tablet Take 1 tablet by mouth 2 (two) times daily. 08/05/17 08/08/17  Fatima Blank, MD    Family History Family History  Problem Relation Age of Onset  . Breast cancer Mother   . Renal Disease Mother   . Hypertension Sister   . Diabetes Sister   . Colon cancer Neg Hx   . Esophageal cancer Neg Hx   . Stomach cancer Neg Hx   . Rectal cancer Neg Hx     Social History Social History  Substance  Use Topics  . Smoking status: Never Smoker  . Smokeless tobacco: Never Used  . Alcohol use 0.0 oz/week     Comment: once a month     Allergies   Penicillins   Review of Systems Review of Systems  Constitutional: Negative for diaphoresis, fatigue and fever.  Cardiovascular: Negative for chest pain.  Gastrointestinal: Negative for abdominal pain and vomiting.  Endocrine: Positive for polyuria. Negative for polydipsia.  Genitourinary: Negative for dysuria.  Neurological: Negative for dizziness.  All other systems are reviewed and are negative for acute change except as noted in the HPI    Physical Exam Updated Vital Signs BP (!) 149/92 (BP Location: Right Arm)   Pulse 78   Temp 97.9 F (36.6 C)   Resp 16   SpO2 100%   Physical Exam  Constitutional: She is oriented to person, place, and time. She appears well-developed and well-nourished. No distress.  HENT:  Head: Normocephalic and atraumatic.  Nose: Nose normal.  Eyes: Pupils are equal, round, and reactive to light. Conjunctivae and EOM are normal. Right eye exhibits no discharge.  Left eye exhibits no discharge. No scleral icterus.  Neck: Normal range of motion. Neck supple.  Cardiovascular: Normal rate and regular rhythm.  Exam reveals no gallop and no friction rub.   No murmur heard. Pulmonary/Chest: Effort normal and breath sounds normal. No stridor. No respiratory distress. She has no rales.  Abdominal: Soft. She exhibits no distension. There is no tenderness.  Musculoskeletal: She exhibits no edema or tenderness.  Neurological: She is alert and oriented to person, place, and time.  Skin: Skin is warm and dry. No rash noted. She is not diaphoretic. No erythema.  Vitiligo   Psychiatric: She has a normal mood and affect.  Vitals reviewed.    ED Treatments / Results  Labs (all labs ordered are listed, but only abnormal results are displayed) Labs Reviewed  CBC - Abnormal; Notable for the following:       Result Value   Hemoglobin 11.9 (*)    All other components within normal limits  URINALYSIS, ROUTINE W REFLEX MICROSCOPIC - Abnormal; Notable for the following:    Glucose, UA >=500 (*)    Ketones, ur 5 (*)    Nitrite POSITIVE (*)    Leukocytes, UA SMALL (*)    Bacteria, UA FEW (*)    Squamous Epithelial / LPF 0-5 (*)    All other components within normal limits  BLOOD GAS, VENOUS - Abnormal; Notable for the following:    Bicarbonate 29.0 (*)    Acid-Base Excess 3.0 (*)    All other components within normal limits  COMPREHENSIVE METABOLIC PANEL - Abnormal; Notable for the following:    Sodium 133 (*)    Chloride 94 (*)    Glucose, Bld 490 (*)    BUN 28 (*)    Creatinine, Ser 1.33 (*)    GFR calc non Af Amer 39 (*)    GFR calc Af Amer 45 (*)    All other components within normal limits  CBG MONITORING, ED - Abnormal; Notable for the following:    Glucose-Capillary 500 (*)    All other components within normal limits  CBG MONITORING, ED - Abnormal; Notable for the following:    Glucose-Capillary 398 (*)    All other components within normal  limits  CBG MONITORING, ED - Abnormal; Notable for the following:    Glucose-Capillary 362 (*)    All other components within normal limits  URINE  CULTURE    EKG  EKG Interpretation None       Radiology No results found.  Procedures Procedures (including critical care time)  Medications Ordered in ED Medications  sulfamethoxazole-trimethoprim (BACTRIM DS,SEPTRA DS) 800-160 MG per tablet 1 tablet (not administered)  sodium chloride 0.9 % bolus 1,000 mL (0 mLs Intravenous Stopped 08/05/17 1408)  sodium chloride 0.9 % bolus 1,000 mL (0 mLs Intravenous Stopped 08/05/17 1523)     Initial Impression / Assessment and Plan / ED Course  I have reviewed the triage vital signs and the nursing notes.  Pertinent labs & imaging results that were available during my care of the patient were reviewed by me and considered in my medical decision making (see chart for details).     Workup with hyperglycemia without evidence of DKA or HHS. UA notable for urinary tract infection. Patient provided with oral and IV hydration which improved her hypoglycemia. Initial dose of Bactrim given in the emergency department. That she is appropriate for outpatient management. Close PCP follow-up recommended.  Final Clinical Impressions(s) / ED Diagnoses   Final diagnoses:  Hyperglycemia  Acute cystitis without hematuria   Disposition: Discharge  Condition: Good  I have discussed the results, Dx and Tx plan with the patient who expressed understanding and agree(s) with the plan. Discharge instructions discussed at great length. The patient was given strict return precautions who verbalized understanding of the instructions. No further questions at time of discharge.    New Prescriptions   SULFAMETHOXAZOLE-TRIMETHOPRIM (BACTRIM DS,SEPTRA DS) 800-160 MG TABLET    Take 1 tablet by mouth 2 (two) times daily.    Follow Up: [name], MD [address] [place]  [address] [phone]  Schedule an appointment as soon as possible for a visit  in 3-5 days for close follow up      Aarushi Hemric, Grayce Sessions, MD 08/05/17 [place]

## 2017-08-05 NOTE — ED Triage Notes (Signed)
Pt sent from PCP for hyperglycemia. Labs drawn yesterday show 635 glucose, TSH 6.92. Takes metformin glipizide for DM. Lost 16 pounds in 3 months. No symptoms. No insulin use.

## 2017-08-05 NOTE — ED Notes (Signed)
Discharge instructions reviewed with patient. Patient verbalizes understanding. VSS.   

## 2017-08-05 NOTE — Telephone Encounter (Signed)
Per Dr Melford Aase, the patient was advised to go to the [facility] due to elevaetd blood sugar of 635 and A1c of 14.  Patient is aware and agreed to go to ER ASAP.

## 2017-08-06 ENCOUNTER — Encounter: Payer: Self-pay | Admitting: Internal Medicine

## 2017-08-06 ENCOUNTER — Other Ambulatory Visit: Payer: Self-pay | Admitting: *Deleted

## 2017-08-06 ENCOUNTER — Ambulatory Visit (INDEPENDENT_AMBULATORY_CARE_PROVIDER_SITE_OTHER): Payer: Medicare Other | Admitting: Internal Medicine

## 2017-08-06 VITALS — BP 122/84 | HR 76 | Temp 97.4°F | Resp 18 | Ht 63.0 in | Wt 233.8 lb

## 2017-08-06 DIAGNOSIS — E119 Type 2 diabetes mellitus without complications: Secondary | ICD-10-CM | POA: Diagnosis not present

## 2017-08-06 DIAGNOSIS — Z794 Long term (current) use of insulin: Secondary | ICD-10-CM

## 2017-08-06 DIAGNOSIS — E1165 Type 2 diabetes mellitus with hyperglycemia: Secondary | ICD-10-CM | POA: Diagnosis not present

## 2017-08-06 LAB — BLOOD GAS, VENOUS
ACID-BASE EXCESS: 3 mmol/L — AB (ref 0.0–2.0)
Bicarbonate: 29 mmol/L — ABNORMAL HIGH (ref 20.0–28.0)
O2 SAT: 46.2 %
PCO2 VEN: 52.5 mmHg (ref 44.0–60.0)
Patient temperature: 98.6
pH, Ven: 7.361 (ref 7.250–7.430)

## 2017-08-06 MED ORDER — GLUCOSE BLOOD VI STRP: ORAL_STRIP | 12 refills | Status: DC

## 2017-08-06 MED ORDER — ONETOUCH ULTRA 2 W/DEVICE KIT: PACK | 0 refills | Status: DC

## 2017-08-06 MED ORDER — ONETOUCH ULTRASOFT LANCETS MISC: 1 refills | Status: AC

## 2017-08-06 NOTE — Patient Instructions (Signed)
Type 1 Diabetes Mellitus, Diagnosis, Adult  Type 1 diabetes (type 1 diabetes mellitus) is a long-term (chronic) disease. It occurs when the pancreas does not make enough of a hormone called insulin. Normally, insulin allows sugars (glucose) to enter cells in the body. The cells use glucose for energy. Lack of insulin causes excess glucose to build up in the blood instead of going into cells. As a result, high blood glucose (hyperglycemia) develops. The exact cause of type 1 diabetes is not known. There is currently no cure for type 1 diabetes, but it can be managed with insulin treatment and lifestyle changes. What increases the risk? You may be more likely to develop this condition if you have a family member who has type 1 diabetes. Other factors may also make you more likely to develop type 1 diabetes, such as:  Having a gene for type 1 diabetes that is passed along from parent to child (inherited).  Living in an area with cold weather conditions.  Exposure to certain viruses.  Certain conditions in which the body's disease-fighting (immune) system attacks itself (autoimmune disorders).  What are the signs or symptoms? Symptoms may develop gradually, over days or weeks, or they may develop suddenly. Symptoms may include:  Increased thirst (polydipsia).  Increased hunger(polyphagia).  Increased urination (polyuria).  Increased urination during the night (nocturia).  Sudden or unexplained weight changes.  Frequent infections that keep coming back (recurring).  Fatigue.  Weakness.  Vision changes, such as blurry vision.  Fruity-smelling breath.  Cuts or bruises that are slow to heal.  Tingling or numbness in the hands or feet.  How is this diagnosed?  This condition is diagnosed based on your symptoms, your medical history, a physical exam, and your blood glucose level. Your blood glucose may be checked with one or more of the following blood tests:  A fasting blood  glucose (FBG) test. You will not be allowed to eat (you will fast) for at least 8 hours before a blood sample is taken.  A random blood glucose test. This checks blood glucose at any time of day regardless of when you ate.  An A1c (hemoglobin A1c) blood test. This provides information about blood glucose control over the previous 2-3 months.  You may be diagnosed with type 1 diabetes if:  Your FBG level is 126 mg/dL (7.0 mmol/L) or higher.  Your random blood glucose level is 200 mg/dL (11.1 mmol/L) or higher.  Your A1c level is 6.5% or higher.  These blood tests may be repeated to confirm your diagnosis. How is this treated? Your treatment may be managed by a specialist called an endocrinologist. Type 1 diabetes can be managed by following instructions from your health care provider about:  Taking insulin daily. This helps to keep your blood glucose levels in the healthy range. ? You may need to adjust your insulin dosage based on how physically active you are and what foods you eat. Your health care provider will tell you how to do this.  Taking medicines to help prevent complications from diabetes, such as: ? Aspirin. ? Medicine to lower cholesterol. ? Medicine to control blood pressure.  Checking your blood glucose as often as directed.  Making diet and lifestyle changes. These may include: ? Following an individualized nutrition plan that is developed by a diet and nutrition specialist (registered dietitian). ? Exercising regularly. ? Finding ways to manage stress.  Your health care provider will set individualized treatment goals for you. Your goals will be based  on your age, other medical conditions you have, and how you respond to diabetes treatment. Generally, the goal of treatment is to maintain the following blood glucose levels:  Before meals (preprandial): 80-130 mg/dL (4.4-7.2 mmol/L).  After meals (postprandial): below 180 mg/dL (10 mmol/L).  A1c level: less than  7%.  Follow these instructions at home: Questions to [facility] Provider   Consider asking the following questions: ? Do I need to meet with a diabetes educator? ? Should I consider joining a support group for people with diabetes? ? What equipment will I need to manage my diabetes at home? ? What diabetes medicines should I take, and when? ? How often should I check my blood glucose? ? What number should I call if I have questions? ? When is my next appointment? General instructions  Take over-the-counter and prescription medicines only as told by your health care provider.  Keep all follow-up visits as told by your health care provider. This is important.  For more information about diabetes, visit: ? American Diabetes Association (ADA): www.diabetes.org ? American Association of Diabetes Educators (AADE): www.diabeteseducator.org/patient-resources Contact a health care provider if:  Your blood glucose level is higher than 240 mg/dL (13.3 mmol/L) for 2 days in a row.  You have been sick or have had a fever for 2 days or more and you are not getting better.  You have any of the following problems for more than 6 hours: ? You cannot eat or drink. ? You have nausea and vomiting. ? You have diarrhea. Get help right away if:  Your blood glucose is below 54 mg/dL (3 mmol/L).  You become confused or you have trouble thinking clearly.  You have difficulty breathing.  You have moderate or large ketone levels in your urine. ++++++++++++++++++++++++++++++++++++++++++++++  -

## 2017-08-06 NOTE — Progress Notes (Signed)
Subjective:    Patient ID: Jocelyn Camacho, female    DOB: 1946/08/06, 71 y.o.   MRN: [id number]  HPI  This nice but poorly compliant morbidly obede MBF e=wuith T2_DM was seen 2 days ago and her labs revealed  A1c had risen from 7.2% 3 mo ago to Over 14% and  Her CBG in the office was 635 mg%. BMET was consistent wit Dehydration with BUN up from 21 to 34  And GFR down from 61 to 34. Patient was referred yesterday morning to the ER and Glucose was down to 500 mg% and she was given IVF and U/A was suspicious for UTI and she was given a tab of Septra DS. It does not appear that she was given any insulin nor her usual diabetic meds (MF & Amaryl) and after IVF her glucose was down to 362 mg% and she was released for out-patient f/u. As she presents this am after Bkfst,  her stat CBG is 462 mg%.   Medication Sig  . allopurinol  300 MG tablet Take 300 mg by mouth once daily  . atenolol  100 MG tablet TAKE ONCE DAILY)  . OPTIVAR ophth soln instill 1 drop into both eyes twice a day  . Azelastine  SOLN 1 spray each nostril twice daily as needed  . BESIVANCE 0.6 % SUSP Place 1 drop into the left eye 4 x daily.   Marland Kitchen VITAMIN D 5000 units Take 10,000 Units by mouth daily.  . colchicine 0.6 MG tablet Take 1 tablet (0.6 mg total) by mouth daily.  . enalapril20 MG tablet TAKE 20 mg BY MOUTH ONCE DAILY FOR BLOOD PRESSURE)  . ferrous fumarate  325  Take 325 mg of iron by mouth daily.   . furosemide ( 40 MG tablet TAKE 40 mg TWICE DAILY  . glipiZIDE 5 MG tablet TAKE 2.5 to 5 mg  3 x /  DAY WITH MEALS  . glucose blood test strip Use as instructed  . hyoscyamine  0.125 MG TAKE 0.125 mg  EVERY 6 HOURS AS NEEDED for stomach pain  . levothyroxine  125 MCG tablet Take 1 tab daily before breakfast.  . meloxicam  15 MG tablet Take 15 mg by mouth daily as needed for pain.  . naproxen  500 MG tablet TAKE  TWICE DAILY WITH MEALS as needed for pain  .  Tumeric with Black Pepper Take 1 tablet by mouth daily.  Marland Kitchen  K-DUR 10 MEQ  tablet Take 10 meq by mouth once daily  . PRED FORTE 1 % ophthalmic suspension ONE DROP INTO EACH EYE once daily  . SEPTRA DS 800-160 MG  Take 1 tablet by mouth 2 (two) times daily.  Marland Kitchen GLUCOPHAGE-XR 500 MG Take  2 tabs  2 x daily. )   Allergies  . Penicillins Swelling   Past Medical History:  Diagnosis Date  . Allergic rhinitis   . Allergy   . Anxiety    with medical procedures  . Hyperlipidemia   . Hypertension   . Obesity   . OSA (obstructive sleep apnea)    wears CPAP  . Type II or unspecified type diabetes mellitus with renal manifestations, not stated as uncontrolled(250.40)   . Unspecified hypothyroidism   . Vitamin D deficiency    Review of Systems  10 point systems review negative except as above.    Objective:   Physical Exam  BP 122/84   Pulse 76   Temp (!) 97.4 F (36.3 C)  Resp 18   Ht 5\' 3"  (1.6 m)   Wt 233 lb 12.8 oz (106.1 kg)   BMI 41.42 kg/m   HEENT - WNL. Neck - supple.  Chest - Clear equal BS. Cor - Nl HS. RRR w/o sig MGR. PP 1(+). No edema. MS- FROM w/o deformities.  Gait Nl. Neuro -  Nl w/o focal abnormalities.    Assessment & Plan:   1. Poorly controlled type 2 diabetes mellitus ([place])   2. Insulin-requiring or dependent type II diabetes mellitus ([place])  - Patient instructed in insulin administration By Nurse - Melbourne Abts, RN  Given stat dose Novolin 70/30 mix - 15 units now  and recommend to take 15 units qam and 10 units qpm ([place])  and make a list to monitor of BID CBG's and return 5-6 days.  - discussed diet principles as have many times in the past.   Over 25 minutes of exam, counseling, chart review and  complex critical decision making was performed

## 2017-08-08 ENCOUNTER — Other Ambulatory Visit: Payer: Self-pay | Admitting: Internal Medicine

## 2017-08-08 DIAGNOSIS — N390 Urinary tract infection, site not specified: Secondary | ICD-10-CM

## 2017-08-08 LAB — URINE CULTURE: Culture: >=100000 — AB

## 2017-08-08 MED ORDER — SULFAMETHOXAZOLE-TRIMETHOPRIM 800-160 MG PO TABS: ORAL_TABLET | ORAL | 0 refills | Status: DC

## 2017-08-09 ENCOUNTER — Telehealth: Payer: Self-pay

## 2017-08-09 NOTE — Telephone Encounter (Signed)
Post ED Visit - Positive Culture Follow-up  Culture report reviewed by antimicrobial stewardship pharmacist:  [x]  Elenor Quinones, Pharm.D. []  Heide Guile, Pharm.D., BCPS AQ-ID []  Parks Neptune, Pharm.D., BCPS []  Alycia Rossetti, Pharm.D., BCPS []  Nicholson, Pharm.D., BCPS, AAHIVP []  Legrand Como, Pharm.D., BCPS, AAHIVP []  Salome Arnt, PharmD, BCPS []  Dimitri Ped, PharmD, BCPS []  Vincenza Hews, PharmD, BCPS  Positive urine culture Treated with Sulfamethoxazole-Trimethoprim, organism sensitive to the same and no further patient follow-up is required at this time.  Genia Del 08/09/2017, 9:52 AM

## 2017-08-11 ENCOUNTER — Encounter: Payer: Self-pay | Admitting: Internal Medicine

## 2017-08-11 ENCOUNTER — Ambulatory Visit (INDEPENDENT_AMBULATORY_CARE_PROVIDER_SITE_OTHER): Payer: Medicare Other | Admitting: Internal Medicine

## 2017-08-11 VITALS — BP 116/76 | HR 68 | Temp 97.6°F | Resp 18 | Ht 63.0 in | Wt 232.6 lb

## 2017-08-11 DIAGNOSIS — E1165 Type 2 diabetes mellitus with hyperglycemia: Secondary | ICD-10-CM | POA: Diagnosis not present

## 2017-08-11 DIAGNOSIS — E119 Type 2 diabetes mellitus without complications: Secondary | ICD-10-CM

## 2017-08-11 DIAGNOSIS — Z794 Long term (current) use of insulin: Secondary | ICD-10-CM | POA: Diagnosis not present

## 2017-08-11 NOTE — Progress Notes (Signed)
Subjective:    Patient ID: ARIABELLA BRIEN, female    DOB: [birth year], 71 y.o.   MRN: [id number]  HPI  Patient is a very nice 71 yo Morbidly Obese MBF with Insulin requiring T2_DM who recently was found to have and elevated glucose of 635 mg% and A1c >14% and she was sent to the ER on 8/15 and given 2 liter bags of IVF and released to f/u as an out patient. Patient was seen 8/16 and started on bid dosing of Novolin 70/30 mix. She returns today taking 25 u qam and 15 u qpm and bid glucoses have dropped down from the 300' over the last 48 hour to the 100's range. She also continues on SXT-DS for her UTI [facility] Anon - sensitive to SXT). She denies hypoglycemic rxn's , diabetic polys, paresthesias or blurred vision.   Medication Sig  . allopurinol (ZYLOPRIM) 300 MG tablet take 1 tablet by mouth once daily (Patient taking differently: Take 300 mg by mouth once daily)  . atenolol (TENORMIN) 100 MG tablet TAKE ONE TABLET BY MOUTH ONCE DAILY (Patient taking differently: TAKE 100 mg BY MOUTH ONCE DAILY)  . azelastine (OPTIVAR) 0.05 % ophthalmic solution instill 1 drop into both eyes twice a day  . Azelastine HCl 0.15 % SOLN 1 spray each nostril twice daily as needed (Patient taking differently: Place 1 spray into both nostrils daily as needed (CONGESTION). 1 spray each nostril twice daily as needed)  . BESIVANCE 0.6 % SUSP Place 1 drop into the left eye 4 (four) times daily.   . Blood Glucose Monitoring Suppl (ONE TOUCH ULTRA 2) w/Device KIT check blood sugar 3 times daily-DX-E10.9  . Blood Glucose Monitoring Suppl (ONE TOUCH ULTRA SYSTEM KIT) w/Device KIT TEST SUGARS AS DIRECTED. Dx Code: E 11.22  . Cholecalciferol (VITAMIN D3) 5000 units CAPS Take 10,000 Units by mouth daily.  . colchicine 0.6 MG tablet Take 1 tablet (0.6 mg total) by mouth daily.  . diazepam (VALIUM) 5 MG tablet TAKE ONE TABLET BY MOUTH AT BEDTIME AS NEEDED FOR ANXIETY (Patient taking differently: TAKE 5 mg BY MOUTH AT BEDTIME AS NEEDED FOR  ANXIETY)  . enalapril (VASOTEC) 20 MG tablet TAKE ONE TABLET BY MOUTH ONCE DAILY FOR BLOOD PRESSURE (Patient taking differently: TAKE 20 mg BY MOUTH ONCE DAILY FOR BLOOD PRESSURE)  . ferrous fumarate (HEMOCYTE - 106 MG FE) 325 (106 Fe) MG TABS tablet Take 325 mg of iron by mouth daily.   . furosemide (LASIX) 40 MG tablet TAKE ONE TABLET BY MOUTH TWICE DAILY FOR BLOOD PRESSURE AND  FLUID (Patient taking differently: TAKE 40 mg BY MOUTH TWICE DAILY FOR BLOOD PRESSURE AND FLUID)  . glipiZIDE (GLUCOTROL) 5 MG tablet TAKE 1/2-1 TABLET BY MOUTH THREE TIMES A DAY WITH MEALS AS DIRECTED (Patient taking differently: TAKE 2.5 to 5 mg BY MOUTH THREE TIMES A DAY WITH MEALS AS DIRECTED)  . glucose blood (ONE TOUCH ULTRA TEST) test strip Check blood sugar 3 times daily-DX-E10.9.  . glucose blood test strip Use as instructed  . hyoscyamine (LEVSIN, ANASPAZ) 0.125 MG tablet TAKE ONE TABLET BY MOUTH EVERY 6 HOURS AS NEEDED (Patient taking differently: TAKE 0.125 mg BY MOUTH EVERY 6 HOURS AS NEEDED for stomach pain)  . Lancets (ONETOUCH ULTRASOFT) lancets Use as instructed Dx. Code: E11.22  . Lancets (ONETOUCH ULTRASOFT) lancets Check blood sugar 3 times daily-DX-E10.9  . levothyroxine (SYNTHROID) 125 MCG tablet Take 1 tablet (125 mcg total) by mouth daily before breakfast.  . meloxicam (MOBIC) 15  MG tablet Take 15 mg by mouth daily as needed for pain.  . naproxen (NAPROSYN) 500 MG tablet TAKE ONE TABLET BY MOUTH TWICE DAILY WITH MEALS (Patient taking differently: TAKE 500 mg BY MOUTH TWICE DAILY WITH MEALS as needed for pain)  . OVER THE COUNTER MEDICATION Take 1 tablet by mouth daily. Tumeric with Black Pepper  . potassium chloride (K-DUR) 10 MEQ tablet take 1 tablet by mouth once daily (Patient taking differently: Take 10 meq by mouth once daily)  . prednisoLONE acetate (PRED FORTE) 1 % ophthalmic suspension INSTILL ONE DROP INTO EACH EYE TWICE DAILY (Patient taking differently: INSTILL ONE DROP INTO EACH EYE once  daily)  . sulfamethoxazole-trimethoprim (BACTRIM DS,SEPTRA DS) 800-160 MG tablet Take 1 tablet 2 x / day with food for Urinary tract Infection  . metFORMIN (GLUCOPHAGE XR) 500 MG 24 hr tablet Take 2 tablets 2 x day for Diabetes (Patient taking differently: Take 1,000 mg by mouth 2 (two) times daily. )   Allergies  Allergen Reactions  . Penicillins Swelling   Past Medical History:  Diagnosis Date  . Allergic rhinitis   . Allergy   . Anxiety    with medical procedures  . Hyperlipidemia   . Hypertension   . Obesity   . OSA (obstructive sleep apnea)    wears CPAP  . Type II or unspecified type diabetes mellitus with renal manifestations, not stated as uncontrolled(250.40)   . Unspecified hypothyroidism   . Vitamin D deficiency    Review of Systems  10 point systems review negative except as above.    Objective:   Physical Exam  BP 116/76   Pulse 68   Temp 97.6 F (36.4 C)   Resp 18   Ht '5\' 3"'$  (1.6 m)   Wt 232 lb 9.6 oz (105.5 kg)   BMI 41.20 kg/m   HEENT - WNL. Neck - supple.  Chest - Clear equal BS. Cor - Nl HS. RRR w/o sig MGR. PP 1(+). No edema. MS- FROM w/o deformities.  Gait Nl. Neuro -  Nl w/o focal abnormalities.    Assessment & Plan:   1. Poorly controlled type 2 diabetes mellitus ([place])   2. Insulin-requiring or dependent type II diabetes mellitus ([place])  - approx 20 min was spent with patient in explaining dynamics of insulin dosing and also discussing diabetic dieting principles.

## 2017-08-27 ENCOUNTER — Encounter (INDEPENDENT_AMBULATORY_CARE_PROVIDER_SITE_OTHER): Payer: Medicare Other | Admitting: Ophthalmology

## 2017-08-27 DIAGNOSIS — H43813 Vitreous degeneration, bilateral: Secondary | ICD-10-CM

## 2017-08-27 DIAGNOSIS — H34812 Central retinal vein occlusion, left eye, with macular edema: Secondary | ICD-10-CM

## 2017-08-27 DIAGNOSIS — H35033 Hypertensive retinopathy, bilateral: Secondary | ICD-10-CM

## 2017-08-27 DIAGNOSIS — E11319 Type 2 diabetes mellitus with unspecified diabetic retinopathy without macular edema: Secondary | ICD-10-CM | POA: Diagnosis not present

## 2017-08-27 DIAGNOSIS — E113291 Type 2 diabetes mellitus with mild nonproliferative diabetic retinopathy without macular edema, right eye: Secondary | ICD-10-CM

## 2017-08-27 DIAGNOSIS — I10 Essential (primary) hypertension: Secondary | ICD-10-CM

## 2017-08-27 DIAGNOSIS — H2511 Age-related nuclear cataract, right eye: Secondary | ICD-10-CM | POA: Diagnosis not present

## 2017-09-14 ENCOUNTER — Other Ambulatory Visit: Payer: Self-pay | Admitting: Internal Medicine

## 2017-09-15 ENCOUNTER — Ambulatory Visit: Payer: Self-pay | Admitting: Internal Medicine

## 2017-09-15 LAB — HM DIABETES EYE EXAM

## 2017-09-24 ENCOUNTER — Encounter (INDEPENDENT_AMBULATORY_CARE_PROVIDER_SITE_OTHER): Payer: Medicare Other | Admitting: Ophthalmology

## 2017-09-27 NOTE — Progress Notes (Signed)
[facility]   [name], M.D.    Uvaldo Bristle. Silverio Lay, P.A.-C      [name], [place]                [address] [place], [place] [address] Telephone [phone] Telefax [phone]  Subjective:    Patient ID: Jocelyn Camacho, female    DOB: 08/27/1946, 71 y.o.   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 Patient was tx'd for a UTI at that time. Also, she is followed for HTN, HLD, OSA ( poorly compliant) and  Vit D Deficiency. She doe s think she had an allergic rxn to the Septra she was taking with itching and a rash.       Re: her Diabetic control, she had been on Novolin 70/30 bib 25 u and 15 units and based on some low readings during nitetime early morning hours, she  has backed  off her pm dose to Novolin 70/30 bib to 25 u and 10 u.   Medication Sig  . allopurinol  300 MG tablet take 1 tablet by mouth once daily  . atenolol  100 MG tablet TAKE ONE TABLET BY MOUTH ONCE DAILY   . azelastine  0.05 % ophthalmic solution instill 1 drop into both eyes twice a day  . Azelastine SOLN 1 spray each nostril twice daily as needed   . BESIVANCE 0.6 % SUSP Place 1 drop into the left eye 4 (four) times daily.   Marland Kitchen VITAMIN D 5000 units  Take 10,000 Units  daily.  . colchicine 0.6 MG tablet Take 1 tablet (0.6 mg total) by mouth daily.  . diazepam  5 MG tablet TAKE ONE TABLET BY MOUTH AT BEDTIME AS NEEDED  . enalapril  20 MG tablet TAKE ONE TABLET BY MOUTH ONCE DAILY   . HEMOCYTE - 106 MG  Take 325 mg of iron by mouth daily.   . furosemide  40 MG tablet TAKE ONE TABLET BY MOUTH TWICE DAILY   . glipiZIDE  5 MG tablet TAKE 1/2-1 TABLET BY MOUTH THREE TIMES A DAY  . Hyoscyamine 0.125 MG  TAKE ONE TABLET BY MOUTH EVERY 6 HOURS  AS NEEDED   . NOVOLIN 70/30 25 units in the AM and 15 units in the PM.  . levothyroxine  125 MCG tablet Take 1 tablet (125 mcg total) by mouth daily before breakfast.  . meloxicam  15 MG tablet Take 15 mg by mouth daily as needed for pain.  . metFORMIN- XR 500 MG  Take 2 tablets 2 x day for Diabetes (Patient taking differently: Take 1,000 mg by mouth 2 (two) times daily. )  . naproxen  500 MG tablet TAKE ONE TABLET BY MOUTH TWICE DAILY WITH MEALS  .  Tumeric with Black Pepper Take 1 tablet by mouth daily.  Marland Kitchen K-DUR 10 MEQ tablet take 1 tablet by mouth once daily   . PRED FORTE 1 % ophth susp INSTILL ONE DROP INTO EACH EYE TWICE DAILY  . SEPTRA DS  Take 1 tablet 2 x / day with food- completed    Allergies  Allergen Reactions  . Penicillins Swelling  Past Medical History:  Diagnosis Date  . Allergic rhinitis   . Allergy   . Anxiety    with medical procedures  . Hyperlipidemia   . Hypertension   . Obesity   . OSA (obstructive sleep apnea)    wears CPAP  . Type II or unspecified type diabetes mellitus with renal manifestations, not stated as uncontrolled(250.40)   . Unspecified hypothyroidism   . Vitamin D deficiency    Past Surgical History:  Procedure Laterality Date  .  eye procedure     have treatment where a needle is stuck into left eye- since June 2014; now every six weeks  . DENTAL EXAMINATION UNDER ANESTHESIA    . TUBAL LIGATION     Review of Systems  10 point systems review negative except as above.    Objective:   Physical Exam   BP 112/60   Pulse 72   Temp (!) 97.5 F (36.4 C)   Resp 18   Ht 5\' 3"  (1.6 m)   Wt 242 lb 12.8 oz (110.1 kg)   BMI 43.01 kg/m   HEENT - WNL. Neck - supple.  Chest - Clear equal BS. Cor - Nl HS. RRR w/o sig MGR. PP 1(+). No edema. MS- FROM w/o deformities.  Gait Nl. Neuro -  Nl w/o focal abnormalities    Assessment & Plan:   1. Poorly controlled type 2 diabetes mellitus ([facility])  - BASIC METABOLIC PANEL WITH GFR  2.  Insulin-requiring or dependent type II diabetes mellitus ([facility])  - BASIC METABOLIC PANEL WITH GFR  3. Urinary tract infection without hematuria, site unspecified  - Urinalysis, Routine w reflex microscopic - Urine Culture  4. Medication management  - CBC with Differential/Platelet - BASIC METABOLIC PANEL WITH GFR - Urinalysis, Routine w reflex microscopic - Urine Culture

## 2017-09-28 ENCOUNTER — Encounter: Payer: Self-pay | Admitting: Internal Medicine

## 2017-09-28 ENCOUNTER — Ambulatory Visit (INDEPENDENT_AMBULATORY_CARE_PROVIDER_SITE_OTHER): Payer: Medicare Other | Admitting: Internal Medicine

## 2017-09-28 VITALS — BP 112/60 | HR 72 | Temp 97.5°F | Resp 18 | Ht 63.0 in | Wt 242.8 lb

## 2017-09-28 DIAGNOSIS — Z794 Long term (current) use of insulin: Secondary | ICD-10-CM | POA: Diagnosis not present

## 2017-09-28 DIAGNOSIS — E1165 Type 2 diabetes mellitus with hyperglycemia: Secondary | ICD-10-CM | POA: Diagnosis not present

## 2017-09-28 DIAGNOSIS — Z79899 Other long term (current) drug therapy: Secondary | ICD-10-CM | POA: Diagnosis not present

## 2017-09-28 DIAGNOSIS — N390 Urinary tract infection, site not specified: Secondary | ICD-10-CM

## 2017-09-28 DIAGNOSIS — E119 Type 2 diabetes mellitus without complications: Secondary | ICD-10-CM

## 2017-09-29 ENCOUNTER — Encounter (INDEPENDENT_AMBULATORY_CARE_PROVIDER_SITE_OTHER): Payer: Medicare Other | Admitting: Ophthalmology

## 2017-09-29 DIAGNOSIS — H35033 Hypertensive retinopathy, bilateral: Secondary | ICD-10-CM | POA: Diagnosis not present

## 2017-09-29 DIAGNOSIS — H2511 Age-related nuclear cataract, right eye: Secondary | ICD-10-CM

## 2017-09-29 DIAGNOSIS — H43813 Vitreous degeneration, bilateral: Secondary | ICD-10-CM

## 2017-09-29 DIAGNOSIS — E113291 Type 2 diabetes mellitus with mild nonproliferative diabetic retinopathy without macular edema, right eye: Secondary | ICD-10-CM

## 2017-09-29 DIAGNOSIS — E11319 Type 2 diabetes mellitus with unspecified diabetic retinopathy without macular edema: Secondary | ICD-10-CM | POA: Diagnosis not present

## 2017-09-29 DIAGNOSIS — I10 Essential (primary) hypertension: Secondary | ICD-10-CM | POA: Diagnosis not present

## 2017-09-29 DIAGNOSIS — H34812 Central retinal vein occlusion, left eye, with macular edema: Secondary | ICD-10-CM | POA: Diagnosis not present

## 2017-09-29 LAB — URINALYSIS, ROUTINE W REFLEX MICROSCOPIC
Bilirubin Urine: NEGATIVE
Glucose, UA: NEGATIVE
Hgb urine dipstick: NEGATIVE
Ketones, ur: NEGATIVE
Nitrite: POSITIVE — AB
Protein, ur: NEGATIVE
RBC / HPF: NONE SEEN /HPF (ref 0–2)
SPECIFIC GRAVITY, URINE: 1.017 (ref 1.001–1.03)

## 2017-09-29 LAB — CBC WITH DIFFERENTIAL/PLATELET
BASOS PCT: 1.2 %
Basophils Absolute: 107 cells/uL (ref 0–200)
EOS ABS: 116 {cells}/uL (ref 15–500)
EOS PCT: 1.3 %
HCT: 32.1 % — ABNORMAL LOW (ref 35.0–45.0)
HEMOGLOBIN: 10.5 g/dL — AB (ref 11.7–15.5)
Lymphs Abs: 2741 cells/uL (ref 850–3900)
MCH: 28.4 pg (ref 27.0–33.0)
MCHC: 32.7 g/dL (ref 32.0–36.0)
MCV: 86.8 fL (ref 80.0–100.0)
MONOS PCT: 6.9 %
MPV: 12.2 fL (ref 7.5–12.5)
NEUTROS ABS: 5322 {cells}/uL (ref 1500–7800)
Neutrophils Relative %: 59.8 %
PLATELETS: 329 10*3/uL (ref 140–400)
RBC: 3.7 10*6/uL — ABNORMAL LOW (ref 3.80–5.10)
RDW: 14.6 % (ref 11.0–15.0)
TOTAL LYMPHOCYTE: 30.8 %
WBC mixed population: 614 cells/uL (ref 200–950)
WBC: 8.9 10*3/uL (ref 3.8–10.8)

## 2017-09-29 LAB — BASIC METABOLIC PANEL WITH GFR
BUN / CREAT RATIO: 20 (calc) (ref 6–22)
BUN: 27 mg/dL — AB (ref 7–25)
CHLORIDE: 103 mmol/L (ref 98–110)
CO2: 31 mmol/L (ref 20–32)
Calcium: 9.6 mg/dL (ref 8.6–10.4)
Creat: 1.36 mg/dL — ABNORMAL HIGH (ref 0.60–0.93)
GFR, Est African American: 45 mL/min/{1.73_m2} — ABNORMAL LOW (ref >=60)
GFR, Est Non African American: 39 mL/min/{1.73_m2} — ABNORMAL LOW (ref >=60)
GLUCOSE: 97 mg/dL (ref 65–99)
Potassium: 4.5 mmol/L (ref 3.5–5.3)
SODIUM: 143 mmol/L (ref 135–146)

## 2017-09-29 LAB — MICROSCOPIC MESSAGE

## 2017-09-30 ENCOUNTER — Other Ambulatory Visit: Payer: Self-pay | Admitting: Internal Medicine

## 2017-09-30 DIAGNOSIS — N3090 Cystitis, unspecified without hematuria: Secondary | ICD-10-CM

## 2017-09-30 LAB — URINE CULTURE
MICRO NUMBER:: 81117159
SPECIMEN QUALITY: ADEQUATE

## 2017-09-30 MED ORDER — CIPROFLOXACIN HCL 250 MG PO TABS: ORAL_TABLET | ORAL | 0 refills | Status: DC

## 2017-10-05 ENCOUNTER — Other Ambulatory Visit: Payer: Self-pay | Admitting: *Deleted

## 2017-10-05 MED ORDER — HYOSCYAMINE SULFATE 0.125 MG PO TABS: 0.1250 mg | ORAL_TABLET | Freq: Four times a day (QID) | ORAL | 1 refills | Status: DC | PRN

## 2017-10-26 ENCOUNTER — Encounter (INDEPENDENT_AMBULATORY_CARE_PROVIDER_SITE_OTHER): Payer: Medicare Other | Admitting: Ophthalmology

## 2017-10-26 DIAGNOSIS — E11319 Type 2 diabetes mellitus with unspecified diabetic retinopathy without macular edema: Secondary | ICD-10-CM

## 2017-10-26 DIAGNOSIS — E113291 Type 2 diabetes mellitus with mild nonproliferative diabetic retinopathy without macular edema, right eye: Secondary | ICD-10-CM | POA: Diagnosis not present

## 2017-10-26 DIAGNOSIS — H35033 Hypertensive retinopathy, bilateral: Secondary | ICD-10-CM | POA: Diagnosis not present

## 2017-10-26 DIAGNOSIS — I10 Essential (primary) hypertension: Secondary | ICD-10-CM | POA: Diagnosis not present

## 2017-10-26 DIAGNOSIS — H43813 Vitreous degeneration, bilateral: Secondary | ICD-10-CM | POA: Diagnosis not present

## 2017-10-26 DIAGNOSIS — H34812 Central retinal vein occlusion, left eye, with macular edema: Secondary | ICD-10-CM

## 2017-11-03 ENCOUNTER — Other Ambulatory Visit: Payer: Self-pay | Admitting: *Deleted

## 2017-11-03 ENCOUNTER — Ambulatory Visit: Payer: Medicare Other

## 2017-11-03 ENCOUNTER — Ambulatory Visit: Payer: Self-pay

## 2017-11-03 DIAGNOSIS — N390 Urinary tract infection, site not specified: Secondary | ICD-10-CM

## 2017-11-03 DIAGNOSIS — N3090 Cystitis, unspecified without hematuria: Secondary | ICD-10-CM

## 2017-11-03 NOTE — Addendum Note (Signed)
Addended by: Chancy Hurter on: 11/03/2017 01:30 PM   Modules accepted: Orders

## 2017-11-03 NOTE — Progress Notes (Signed)
Patient presents to the office for a Nurse Visit for a recheck of her urine. Patient states she is not having any symptoms at all.

## 2017-11-04 LAB — URINALYSIS, ROUTINE W REFLEX MICROSCOPIC
Bilirubin Urine: NEGATIVE
GLUCOSE, UA: NEGATIVE
HGB URINE DIPSTICK: NEGATIVE
HYALINE CAST: NONE SEEN /LPF
Ketones, ur: NEGATIVE
Nitrite: POSITIVE — AB
PROTEIN: NEGATIVE
Specific Gravity, Urine: 1.011 (ref 1.001–1.03)
pH: 5.5 (ref 5.0–8.0)

## 2017-11-05 LAB — URINE CULTURE
MICRO NUMBER: 81279096
SPECIMEN QUALITY:: ADEQUATE

## 2017-11-11 ENCOUNTER — Other Ambulatory Visit: Payer: Self-pay

## 2017-11-11 DIAGNOSIS — N3090 Cystitis, unspecified without hematuria: Secondary | ICD-10-CM

## 2017-11-11 MED ORDER — CIPROFLOXACIN HCL 250 MG PO TABS: ORAL_TABLET | ORAL | 0 refills | Status: DC

## 2017-11-16 DIAGNOSIS — E785 Hyperlipidemia, unspecified: Secondary | ICD-10-CM

## 2017-11-16 DIAGNOSIS — E1169 Type 2 diabetes mellitus with other specified complication: Secondary | ICD-10-CM | POA: Insufficient documentation

## 2017-11-16 NOTE — Progress Notes (Deleted)
Complete Physical  Assessment and Plan: Essential hypertension - continue medications, DASH diet, exercise and monitor at home. Call if greater than 130/80.  - CBC with Differential - BASIC METABOLIC PANEL WITH GFR - Hepatic function panel - Urinalysis, Routine w reflex microscopic - Microalbumin / creatinine urine ratio - EKG 12-Lead  OSA (obstructive sleep apnea) Weight loss advised, needs to wear nightly, may benefit from new mask/titration   Hypothyroidism, unspecified hypothyroidism type Hypothyroidism-check TSH level, continue medications the same, reminded to take on an empty stomach 30-27mns before food.  - TSH   Hyperlipidemia -continue medications, check lipids, decrease fatty foods, increase activity.  - Lipid panel   Vitamin D deficiency - Vit D  25 hydroxy (rtn osteoporosis monitoring)   Obesity, morbid Obesity with co morbidities- long discussion about weight loss, diet, and exercise  Medication management - Magnesium  CKD stage 2 due to type 2 diabetes mellitus Discussed general issues about diabetes pathophysiology and management., Educational material distributed., Suggested low cholesterol diet., Encouraged aerobic exercise., Discussed foot care., Reminded to get yearly retinal exam. - Hemoglobin A1c  Anxiety Will take valium prior to procedures but would like to be able to take AS needed for sleep, long discussion about addictive nature and tolerance, will only take PRN.   Macular degeneration, unspecified laterality, unspecified type  continue follow up eye doctor  Chronic gout without tophus, unspecified cause, unspecified site Gout- recheck Uric acid as needed, Diet discussed, continue medications.   Discussed med's effects and SE's. Screening labs and tests as requested with regular follow-up as recommended. Future Appointments  Date Time Provider [place] 11/17/2017 10:30 AM CVicie Mutters PA-C [facility] None  11/23/2017  8:45 AM  MHayden Pedro MD [facility] None  04/22/2018 10:00 AM CVicie Mutters PA-C [facility] None    Plan:   During the course of the visit the patient was educated and counseled about appropriate screening and preventive services including:    Pneumococcal vaccine   Prevnar 13  Influenza vaccine  Td vaccine  Screening electrocardiogram  Bone densitometry screening  Colorectal cancer screening  Diabetes screening  Glaucoma screening  Nutrition counseling   Advanced directives: requested   HPI  71y.o. AA female  presents for medicare wellness and 3 month follow up.   She has had elevated blood pressure since . Her blood pressure has been controlled at home, today their BP is   She does not workout due to knee pain.  She denies chest pain, shortness of breath, dizziness.  She is on cholesterol medication and denies myalgias. Her cholesterol is at goal. The cholesterol last visit was:   Lab Results  Component Value Date   CHOL 154 08/04/2017   HDL 31 (L) 08/04/2017   LDLCALC 59 08/04/2017   TRIG 319 (H) 08/04/2017   CHOLHDL 5.0 (H) 08/04/2017   She has had diabetes for 3 years. She has been working on diet and exercise for Diabetes with CKD stage II, she is on ACE, she is on bASA, She is on metformin and glucotrol 524mTID and denies paresthesia of the feet, polydipsia and polyuria. Checking sugars every other day, does not know name of machine.  Denies any hypoglycemia. Last A1C in the office was:  Lab Results  Component Value Date   HGBA1C >14.0 (H) 08/04/2017   Patient is on Vitamin D supplement.   Lab Results  Component Value Date   VD25OH 61 08/04/2017     She is on thyroid medication. Her medication  was changed last visit. She is on 138mg daily except 1.5 on Sunday with water 1 hour before food. Patient denies feeling cold and cold intolerance, constipation, palpitations, weight loss, diarrhea  Lab Results  Component Value Date   TSH 6.92 (H) 08/04/2017   She  is getting shots for Mac Degen with Dr. MZigmund Daniel saw him yesterday but did not need a shot.  Patient is on allopurinol for gout and does not report a recent flare.  Lab Results  Component Value Date   LABURIC 7.4 (H) 08/04/2017   BMI is There is no height or weight on file to calculate BMI., she is working on diet and exercise. She has OSA and does CPAP. She has been having knee pain, has seen [place] [facility] and on mobic, taking every other day.  Wt Readings from Last 3 Encounters:  09/28/17 242 lb 12.8 oz (110.1 kg)  08/11/17 232 lb 9.6 oz (105.5 kg)  08/06/17 233 lb 12.8 oz (106.1 kg)    Current Medications:  Current Outpatient Medications on File Prior to Visit  Medication Sig Dispense Refill  . allopurinol (ZYLOPRIM) 300 MG tablet take 1 tablet by mouth once daily 90 tablet 1  . atenolol (TENORMIN) 100 MG tablet TAKE ONE TABLET BY MOUTH ONCE DAILY (Patient taking differently: TAKE 100 mg BY MOUTH ONCE DAILY) 90 tablet 1  . BESIVANCE 0.6 % SUSP Place 1 drop into the left eye 4 (four) times daily.     . Blood Glucose Monitoring Suppl (ONE TOUCH ULTRA 2) w/Device KIT check blood sugar 3 times daily-DX-E10.9 1 each 0  . Blood Glucose Monitoring Suppl (ONE TOUCH ULTRA SYSTEM KIT) w/Device KIT TEST SUGARS AS DIRECTED. Dx Code: E 11.22 1 each 0  . Cholecalciferol (VITAMIN D3) 5000 units CAPS Take 10,000 Units by mouth daily.    . ciprofloxacin (CIPRO) 250 MG tablet Take 1 tablet 2 x / day with food for Infection 20 tablet 0  . colchicine 0.6 MG tablet Take 1 tablet (0.6 mg total) by mouth daily. 30 tablet 2  . diazepam (VALIUM) 5 MG tablet TAKE ONE TABLET BY MOUTH AT BEDTIME AS NEEDED FOR ANXIETY (Patient taking differently: TAKE 5 mg BY MOUTH AT BEDTIME AS NEEDED FOR ANXIETY) 30 tablet 1  . enalapril (VASOTEC) 20 MG tablet TAKE ONE TABLET BY MOUTH ONCE DAILY FOR BLOOD PRESSURE (Patient taking differently: TAKE 20 mg BY MOUTH ONCE DAILY FOR BLOOD PRESSURE) 90 tablet 1  . ferrous fumarate  (HEMOCYTE - 106 MG FE) 325 (106 Fe) MG TABS tablet Take 325 mg of iron by mouth daily.     . furosemide (LASIX) 40 MG tablet TAKE ONE TABLET BY MOUTH TWICE DAILY FOR BLOOD PRESSURE AND  FLUID (Patient taking differently: TAKE 40 mg BY MOUTH TWICE DAILY FOR BLOOD PRESSURE AND FLUID) 180 tablet 1  . glipiZIDE (GLUCOTROL) 5 MG tablet TAKE 1/2-1 TABLET BY MOUTH THREE TIMES A DAY WITH MEALS AS DIRECTED (Patient taking differently: TAKE 2.5 to 5 mg BY MOUTH THREE TIMES A DAY WITH MEALS AS DIRECTED) 90 tablet 2  . glucose blood (ONE TOUCH ULTRA TEST) test strip Check blood sugar 3 times daily-DX-E10.9. 100 each 12  . glucose blood test strip Use as instructed 100 each 3  . hyoscyamine (LEVSIN, ANASPAZ) 0.125 MG tablet Take 1 tablet (0.125 mg total) by mouth every 6 (six) hours as needed. 60 tablet 1  . insulin NPH-regular Human (NOVOLIN 70/30) (70-30) 100 UNIT/ML injection Inject into the skin. Patient injects 25  units in the AM and 15 units in the PM.    . Lancets (ONETOUCH ULTRASOFT) lancets Use as instructed Dx. Code: E11.22 100 each 12  . Lancets (ONETOUCH ULTRASOFT) lancets Check blood sugar 3 times daily-DX-E10.9 300 each 1  . levothyroxine (SYNTHROID) 125 MCG tablet Take 1 tablet (125 mcg total) by mouth daily before breakfast. 90 tablet 1  . meloxicam (MOBIC) 15 MG tablet Take 15 mg by mouth daily as needed for pain.    . metFORMIN (GLUCOPHAGE XR) 500 MG 24 hr tablet Take 2 tablets 2 x day for Diabetes (Patient taking differently: Take 1,000 mg by mouth 2 (two) times daily. ) 360 tablet 1  . naproxen (NAPROSYN) 500 MG tablet TAKE ONE TABLET BY MOUTH TWICE DAILY WITH MEALS (Patient taking differently: TAKE 500 mg BY MOUTH TWICE DAILY WITH MEALS as needed for pain) 180 tablet 1  . OVER THE COUNTER MEDICATION Take 1 tablet by mouth daily. Tumeric with Black Pepper    . potassium chloride (K-DUR) 10 MEQ tablet take 1 tablet by mouth once daily (Patient taking differently: Take 10 meq by mouth once daily)  90 tablet 3  . prednisoLONE acetate (PRED FORTE) 1 % ophthalmic suspension INSTILL ONE DROP INTO EACH EYE TWICE DAILY (Patient taking differently: INSTILL ONE DROP INTO EACH EYE once daily) 5 mL 0   No current facility-administered medications on file prior to visit.    Health Maintenance:   Immunization History  Administered Date(s) Administered  . DT 06/19/2015  . DTaP 12/09/2004  . Pneumococcal Conjugate-13 12/19/2014  . Pneumococcal Polysaccharide-23 03/02/2009   Last colonoscopy: 03/2014 normal Last mammogram: 01/2017, CAT A Last pap smear/pelvic exam: 2012 remote DEXA: 11/2015 normal Carotid US 2013 CXR 2009  Prior vaccinations: TD or Tdap: 2016 Influenza: declines Pneumococcal: 2010 declines Prevnar 13: 2015 Shingles/Zostavax: declines   Eye exam 04/20/2017 Dr. Zigmund Daniel Dr. Katy Fitch 09/15/2017 no DM retinopathy Patient Care Team: [name], MD as PCP - General (Internal Medicine) Warden Fillers, MD as Consulting Physician (Optometry) [name], MD as Consulting Physician (Ophthalmology) Inda Castle, MD as Consulting Physician (Gastroenterology) Druscilla Brownie, MD as Consulting Physician (Dermatology) Allyn Kenner, MD (Dermatology)   Medical History:  Past Medical History:  Diagnosis Date  . Allergic rhinitis   . Allergy   . Anxiety    with medical procedures  . Hyperlipidemia   . Hypertension   . Obesity   . OSA (obstructive sleep apnea)    wears CPAP  . Type II or unspecified type diabetes mellitus with renal manifestations, not stated as uncontrolled(250.40)   . Unspecified hypothyroidism   . Vitamin D deficiency    Allergies Allergies  Allergen Reactions  . Penicillins Swelling  . Sulfa Antibiotics Itching and Rash    SURGICAL HISTORY She  has a past surgical history that includes  eye procedure; Tubal ligation; and Dental examination under anesthesia. FAMILY HISTORY Her family history includes Breast cancer in her  mother; Diabetes in her sister; Hypertension in her sister; Renal Disease in her mother. SOCIAL HISTORY She  reports that  has never smoked. she has never used smokeless tobacco. She reports that she drinks alcohol. She reports that she does not use drugs.  MEDICARE WELLNESS OBJECTIVES: Physical activity:   Cardiac risk factors:   Depression/mood screen:   Depression screen [facility] 2/9 08/04/2017  Decreased Interest 0  Down, Depressed, Hopeless 0  PHQ - 2 Score 0    ADLs:  In your present state of health, do you  have any difficulty performing the following activities: 08/04/2017  Hearing? N  Vision? N  Difficulty concentrating or making decisions? N  Walking or climbing stairs? N  Dressing or bathing? N  Doing errands, shopping? N  Some recent data might be hidden     Cognitive Testing  Alert? Yes  Normal Appearance?Yes  Oriented to person? Yes  Place? Yes   Time? Yes  Recall of three objects?  Yes  Can perform simple calculations? Yes  Displays appropriate judgment?Yes  Can read the correct time from a watch face?Yes  EOL planning:      Review of Systems  Constitutional: Negative.   HENT: Negative.   Eyes: Negative.   Respiratory: Negative.   Cardiovascular: Negative.   Gastrointestinal: Negative.   Genitourinary: Negative for dysuria, flank pain, frequency, hematuria and urgency.  Musculoskeletal: Positive for joint pain (bilateral knee pain).  Skin: Negative.   Neurological: Negative.   Endo/Heme/Allergies: Negative.   Psychiatric/Behavioral: Negative for depression, hallucinations, memory loss, substance abuse and suicidal ideas. The patient is nervous/anxious. The patient does not have insomnia.     Physical Exam: Estimated body mass index is 43.01 kg/m as calculated from the following:   Height as of 09/28/17: 5' 3"  (1.6 m).   Weight as of 09/28/17: 242 lb 12.8 oz (110.1 kg). There were no vitals taken for this visit. General Appearance: Well nourished, in no  apparent distress.  Eyes: PERRLA, EOMs, conjunctiva no swelling or erythema, normal fundi and vessels.  Sinuses: No Frontal/maxillary tenderness  ENT/Mouth: Ext aud canals clear, normal light reflex with TMs without erythema, bulging. Good dentition. No erythema, swelling, or exudate on post pharynx. Tonsils not swollen or erythematous. Hearing normal.  Neck: Supple, thyroid normal. No bruits  Respiratory: Respiratory effort normal, BS equal bilaterally without rales, rhonchi, wheezing or stridor.  Cardio: RRR with 2/6 systolic murmurs RSB, without rubs or gallops. Brisk peripheral pulses without edema.  Chest: symmetric, with normal excursions and percussion.  Breasts: declines, states getting MGM Abdomen: Soft, nontender, no guarding, rebound, hernias, masses, or organomegaly. .  Lymphatics: Non tender without lymphadenopathy.  Genitourinary: defer Musculoskeletal: Full ROM all peripheral extremities,5/5 strength, and normal gait.  Skin: Vitiligo bilateral hands and feet. Warm, dry without rashes, lesions, ecchymosis. Neuro: Cranial nerves intact, reflexes equal bilaterally. Normal muscle tone, no cerebellar symptoms. Sensation intact.  Psych: Awake and oriented X 3, normal affect, Insight and Judgment appropriate.    Medicare Attestation I have personally reviewed: The patient's medical and social history Their use of alcohol, tobacco or illicit drugs Their current medications and supplements The patient's functional ability including ADLs,fall risks, home safety risks, cognitive, and hearing and visual impairment Diet and physical activities Evidence for depression or mood disorders  The patient's weight, height, BMI, and visual acuity have been recorded in the chart.  I have made referrals, counseling, and provided education to the patient based on review of the above and I have provided the patient with a written personalized care plan for preventive services.      Vicie Mutters 8:26 AM [facility]

## 2017-11-17 ENCOUNTER — Ambulatory Visit: Payer: Self-pay | Admitting: Physician Assistant

## 2017-11-17 ENCOUNTER — Encounter: Payer: Self-pay | Admitting: Physician Assistant

## 2017-11-17 ENCOUNTER — Ambulatory Visit (INDEPENDENT_AMBULATORY_CARE_PROVIDER_SITE_OTHER): Payer: Medicare Other | Admitting: Physician Assistant

## 2017-11-17 VITALS — BP 150/90 | HR 86 | Temp 97.5°F | Resp 16 | Ht 63.0 in | Wt 257.2 lb

## 2017-11-17 DIAGNOSIS — Z7189 Other specified counseling: Secondary | ICD-10-CM

## 2017-11-17 DIAGNOSIS — E1169 Type 2 diabetes mellitus with other specified complication: Secondary | ICD-10-CM | POA: Diagnosis not present

## 2017-11-17 DIAGNOSIS — E039 Hypothyroidism, unspecified: Secondary | ICD-10-CM

## 2017-11-17 DIAGNOSIS — E782 Mixed hyperlipidemia: Secondary | ICD-10-CM

## 2017-11-17 DIAGNOSIS — N182 Chronic kidney disease, stage 2 (mild): Secondary | ICD-10-CM

## 2017-11-17 DIAGNOSIS — Z0000 Encounter for general adult medical examination without abnormal findings: Secondary | ICD-10-CM

## 2017-11-17 DIAGNOSIS — Z0001 Encounter for general adult medical examination with abnormal findings: Secondary | ICD-10-CM

## 2017-11-17 DIAGNOSIS — Z6841 Body Mass Index (BMI) 40.0 and over, adult: Secondary | ICD-10-CM

## 2017-11-17 DIAGNOSIS — R6889 Other general symptoms and signs: Secondary | ICD-10-CM

## 2017-11-17 DIAGNOSIS — E1122 Type 2 diabetes mellitus with diabetic chronic kidney disease: Secondary | ICD-10-CM | POA: Diagnosis not present

## 2017-11-17 DIAGNOSIS — N390 Urinary tract infection, site not specified: Secondary | ICD-10-CM

## 2017-11-17 DIAGNOSIS — M1A9XX0 Chronic gout, unspecified, without tophus (tophi): Secondary | ICD-10-CM

## 2017-11-17 DIAGNOSIS — I10 Essential (primary) hypertension: Secondary | ICD-10-CM

## 2017-11-17 DIAGNOSIS — Z79899 Other long term (current) drug therapy: Secondary | ICD-10-CM | POA: Diagnosis not present

## 2017-11-17 DIAGNOSIS — E785 Hyperlipidemia, unspecified: Secondary | ICD-10-CM

## 2017-11-17 NOTE — Progress Notes (Signed)
MEDICARE WELLNESS   Assessment and Plan: Essential hypertension - continue medications, DASH diet, exercise and monitor at home. Call if greater than 130/80.  WILL BRING BACK NEW BP CUFF AND DO NURSE VISIT TO RECHECK BP - CBC with Differential - BASIC METABOLIC PANEL WITH GFR - Hepatic function panel - Urinalysis, Routine w reflex microscopic - Microalbumin / creatinine urine ratio - EKG 12-Lead  OSA (obstructive sleep apnea) Weight loss advised, needs to wear nightly, may benefit from new mask/titration   Hypothyroidism, unspecified hypothyroidism type Hypothyroidism-check TSH level, continue medications the same, reminded to take on an empty stomach 30-14mns before food.  - TSH   Hyperlipidemia -continue medications, check lipids, decrease fatty foods, increase activity.  - Lipid panel   Vitamin D deficiency - Vit D  25 hydroxy (rtn osteoporosis monitoring)   Obesity, morbid Obesity with co morbidities- long discussion about weight loss, diet, and exercise  Medication management - Magnesium  CKD stage 2 due to type 2 diabetes mellitus Discussed general issues about diabetes pathophysiology and management., Educational material distributed., Suggested low cholesterol diet., Encouraged aerobic exercise., Discussed foot care., Reminded to get yearly retinal exam. - Hemoglobin A1c  Anxiety Will take valium prior to procedures but would like to be able to take AS needed for sleep, long discussion about addictive nature and tolerance, will only take PRN.   Macular degeneration, unspecified laterality, unspecified type  continue follow up eye doctor  Chronic gout without tophus, unspecified cause, unspecified site Gout- recheck Uric acid as needed, Diet discussed, continue medications.  BMI 45.0-49.9, adult ([place] - follow up 3 months for progress monitoring - increase veggies, decrease carbs - long discussion about weight loss, diet, and exercise  Type 2 diabetes  mellitus with hyperlipidemia ([place] Discussed general issues about diabetes pathophysiology and management., Educational material distributed., Suggested low cholesterol diet., Encouraged aerobic exercise., Discussed foot care., Reminded to get yearly retinal exam. -     Hemoglobin A1c  Encounter for Medicare annual wellness exam 1 year  Advanced care planning/counseling discussion Discussed advanced care planning with patient and/or family At least 30 mins discussed with the patient and/or family at the visit.   Urinary tract infection without hematuria, site unspecified -     Urine Culture   Discussed med's effects and SE's. Screening labs and tests as requested with regular follow-up as recommended. Future Appointments  Date Time Provider [place] 11/23/2017  8:45 AM MHayden Pedro MD [facility] None  04/22/2018 10:00 AM CVicie Mutters PA-C [id number] None    Plan:   During the course of the visit the patient was educated and counseled about appropriate screening and preventive services including:    Pneumococcal vaccine   Prevnar 13  Influenza vaccine  Td vaccine  Screening electrocardiogram  Bone densitometry screening  Colorectal cancer screening  Diabetes screening  Glaucoma screening  Nutrition counseling   Advanced directives: requested   HPI  71y.o. AA female  presents for medicare wellness and 3 month follow up  She was treated for UTI x 2, last treated 11/04/2016.  She hit her leg 1 week ago, left lower leg, corner of table, has been dressing it at home. No warmth, dishcarge.  She has had sinus congestion x Thursday or Friday. She is on cough medicine. Clear mucus has been loose, no fever or chills.    Her blood pressure is not checked at home but she has NOT taken any BP meds today, today their BP is BP: (!) 150/90(left  arm) She does not workout due to knee pain.  She denies chest pain, shortness of breath, dizziness.  She is on  cholesterol medication and denies myalgias. Her cholesterol is at goal. The cholesterol last visit was:   Lab Results  Component Value Date   CHOL 154 08/04/2017   HDL 31 (L) 08/04/2017   LDLCALC 59 08/04/2017   TRIG 319 (H) 08/04/2017   CHOLHDL 5.0 (H) 08/04/2017   She has had diabetes for 3 years. She has been working on diet and exercise for Diabetes with CKD stage III, she is on ACE, she is on bASA, She is on metformin and glucotrol 16m 1/2 a pill TID, she is on 70/30 insulin 25 units and 10units and denies paresthesia of the feet, polydipsia and polyuria. She is checking her sugars, highest is 220 after food, 88 is the lowest fasting and 170 highest fasting.Checks sugars twice a day with one touch ultra.  Denies any hypoglycemia. Last A1C in the office was:  Lab Results  Component Value Date   HGBA1C >14.0 (H) 08/04/2017   Lab Results  Component Value Date   GFRAA 45 (L) 09/28/2017   Patient is on Vitamin D supplement.   Lab Results  Component Value Date   VD25OH 61 08/04/2017     She is on thyroid medication. Her medication was changed last visit. She is on 1228m daily except 1.5 on Sunday with water 1 hour before food.  Lab Results  Component Value Date   TSH 6.92 (H) 08/04/2017   She is getting shots for Mac Degen with Dr. MaZigmund Danielsaw him yesterday but did not need a shot.  Patient is on allopurinol for gout and does not report a recent flare.  Lab Results  Component Value Date   LABURIC 7.4 (H) 08/04/2017   BMI is Body mass index is 45.56 kg/m., she is working on diet and exercise. She has OSA and does CPAP.  Wt Readings from Last 3 Encounters:  11/17/17 257 lb 3.2 oz (116.7 kg)  09/28/17 242 lb 12.8 oz (110.1 kg)  08/11/17 232 lb 9.6 oz (105.5 kg)    Current Medications:  Current Outpatient Medications on File Prior to Visit  Medication Sig Dispense Refill  . allopurinol (ZYLOPRIM) 300 MG tablet take 1 tablet by mouth once daily 90 tablet 1  . atenolol  (TENORMIN) 100 MG tablet TAKE ONE TABLET BY MOUTH ONCE DAILY (Patient taking differently: TAKE 100 mg BY MOUTH ONCE DAILY) 90 tablet 1  . BESIVANCE 0.6 % SUSP Place 1 drop into the left eye 4 (four) times daily.     . Blood Glucose Monitoring Suppl (ONE TOUCH ULTRA 2) w/Device KIT check blood sugar 3 times daily-DX-E10.9 1 each 0  . Cholecalciferol (VITAMIN D3) 5000 units CAPS Take 10,000 Units by mouth daily.    . ciprofloxacin (CIPRO) 250 MG tablet Take 1 tablet 2 x / day with food for Infection 20 tablet 0  . colchicine 0.6 MG tablet Take 1 tablet (0.6 mg total) by mouth daily. 30 tablet 2  . diazepam (VALIUM) 5 MG tablet TAKE ONE TABLET BY MOUTH AT BEDTIME AS NEEDED FOR ANXIETY (Patient taking differently: TAKE 5 mg BY MOUTH AT BEDTIME AS NEEDED FOR ANXIETY) 30 tablet 1  . enalapril (VASOTEC) 20 MG tablet TAKE ONE TABLET BY MOUTH ONCE DAILY FOR BLOOD PRESSURE (Patient taking differently: TAKE 20 mg BY MOUTH ONCE DAILY FOR BLOOD PRESSURE) 90 tablet 1  . ferrous fumarate (HEMOCYTE - 106 MG  FE) 325 (106 Fe) MG TABS tablet Take 325 mg of iron by mouth daily.     . furosemide (LASIX) 40 MG tablet TAKE ONE TABLET BY MOUTH TWICE DAILY FOR BLOOD PRESSURE AND  FLUID (Patient taking differently: TAKE 40 mg BY MOUTH TWICE DAILY FOR BLOOD PRESSURE AND FLUID) 180 tablet 1  . glipiZIDE (GLUCOTROL) 5 MG tablet TAKE 1/2-1 TABLET BY MOUTH THREE TIMES A DAY WITH MEALS AS DIRECTED (Patient taking differently: TAKE 2.5 to 5 mg BY MOUTH THREE TIMES A DAY WITH MEALS AS DIRECTED) 90 tablet 2  . glucose blood (ONE TOUCH ULTRA TEST) test strip Check blood sugar 3 times daily-DX-E10.9. 100 each 12  . hyoscyamine (LEVSIN, ANASPAZ) 0.125 MG tablet Take 1 tablet (0.125 mg total) by mouth every 6 (six) hours as needed. 60 tablet 1  . insulin NPH-regular Human (NOVOLIN 70/30) (70-30) 100 UNIT/ML injection Inject into the skin. Patient injects 25 units in the AM and 15 units in the PM.    . Lancets (ONETOUCH ULTRASOFT) lancets  Use as instructed Dx. Code: E11.22 100 each 12  . Lancets (ONETOUCH ULTRASOFT) lancets Check blood sugar 3 times daily-DX-E10.9 300 each 1  . levothyroxine (SYNTHROID) 125 MCG tablet Take 1 tablet (125 mcg total) by mouth daily before breakfast. 90 tablet 1  . meloxicam (MOBIC) 15 MG tablet Take 15 mg by mouth daily as needed for pain.    . naproxen (NAPROSYN) 500 MG tablet TAKE ONE TABLET BY MOUTH TWICE DAILY WITH MEALS (Patient taking differently: TAKE 500 mg BY MOUTH TWICE DAILY WITH MEALS as needed for pain) 180 tablet 1  . OVER THE COUNTER MEDICATION Take 1 tablet by mouth daily. Tumeric with Black Pepper    . potassium chloride (K-DUR) 10 MEQ tablet take 1 tablet by mouth once daily (Patient taking differently: Take 10 meq by mouth once daily) 90 tablet 3  . prednisoLONE acetate (PRED FORTE) 1 % ophthalmic suspension INSTILL ONE DROP INTO EACH EYE TWICE DAILY (Patient taking differently: INSTILL ONE DROP INTO EACH EYE once daily) 5 mL 0  . metFORMIN (GLUCOPHAGE XR) 500 MG 24 hr tablet Take 2 tablets 2 x day for Diabetes (Patient taking differently: Take 1,000 mg by mouth 2 (two) times daily. ) 360 tablet 1   No current facility-administered medications on file prior to visit.    Health Maintenance:   Immunization History  Administered Date(s) Administered  . DT 06/19/2015  . DTaP 12/09/2004  . Pneumococcal Conjugate-13 12/19/2014  . Pneumococcal Polysaccharide-23 03/02/2009   Last colonoscopy: 03/2014 normal Last mammogram: 01/2017, CAT A Last pap smear/pelvic exam: 2012 remote DEXA: 11/2015 normal Carotid US 2013 CXR 2009  Prior vaccinations: TD or Tdap: 2016 Influenza: declines Pneumococcal: 2010 declines Prevnar 13: 2015 Shingles/Zostavax: declines   Eye exam 10/20/2017 Dr. Zigmund Daniel Dr. Katy Fitch 09/15/2017 no DM retinopathy Patient Care Team: [name], MD as PCP - General (Internal Medicine) Warden Fillers, MD as Consulting Physician  (Optometry) [name], MD as Consulting Physician (Ophthalmology) Inda Castle, MD as Consulting Physician (Gastroenterology) Druscilla Brownie, MD as Consulting Physician (Dermatology) Allyn Kenner, MD (Dermatology)   Medical History:  Past Medical History:  Diagnosis Date  . Allergic rhinitis   . Allergy   . Anxiety    with medical procedures  . Hyperlipidemia   . Hypertension   . Obesity   . OSA (obstructive sleep apnea)    wears CPAP  . Type II or unspecified type diabetes mellitus with renal manifestations, not stated as  uncontrolled(250.40)   . Unspecified hypothyroidism   . Vitamin D deficiency    Allergies Allergies  Allergen Reactions  . Penicillins Swelling  . Sulfa Antibiotics Itching and Rash    SURGICAL HISTORY She  has a past surgical history that includes  eye procedure; Tubal ligation; and Dental examination under anesthesia. FAMILY HISTORY Her family history includes Breast cancer in her mother; Diabetes in her sister; Hypertension in her sister; Renal Disease in her mother. SOCIAL HISTORY She  reports that  has never smoked. she has never used smokeless tobacco. She reports that she drinks alcohol. She reports that she does not use drugs.  MEDICARE WELLNESS OBJECTIVES: Physical activity:   Cardiac risk factors:   Depression/mood screen:   Depression screen [facility] 2/9 08/04/2017  Decreased Interest 0  Down, Depressed, Hopeless 0  PHQ - 2 Score 0    ADLs:  In your present state of health, do you have any difficulty performing the following activities: 08/04/2017  Hearing? N  Vision? N  Difficulty concentrating or making decisions? N  Walking or climbing stairs? N  Dressing or bathing? N  Doing errands, shopping? N  Some recent data might be hidden     Cognitive Testing  Alert? Yes  Normal Appearance?Yes  Oriented to person? Yes  Place? Yes   Time? Yes  Recall of three objects?  Yes  Can perform simple calculations? Yes  Displays  appropriate judgment?Yes  Can read the correct time from a watch face?Yes  EOL planning: Does Patient Have a Medical Advance Directive?: No Would patient like information on creating a medical advance directive?: Yes (MAU/Ambulatory/Procedural Areas - Information given)    Review of Systems  Constitutional: Negative.   HENT: Negative.   Eyes: Negative.   Respiratory: Negative.   Cardiovascular: Negative.   Gastrointestinal: Negative.   Genitourinary: Negative for dysuria, flank pain, frequency, hematuria and urgency.  Musculoskeletal: Positive for joint pain (bilateral knee pain).  Skin: Negative.   Neurological: Negative.   Endo/Heme/Allergies: Negative.   Psychiatric/Behavioral: Negative for depression, hallucinations, memory loss, substance abuse and suicidal ideas. The patient is nervous/anxious. The patient does not have insomnia.     Physical Exam: Estimated body mass index is 45.56 kg/m as calculated from the following:   Height as of this encounter: _0  (1.6 m).   Weight as of this encounter: 257 lb 3.2 oz (116.7 kg). BP (!) 150/90 Comment: left arm  Pulse 86   Temp (!) 97.5 F (36.4 C)   Resp 16   Ht _1  (1.6 m)   Wt 257 lb 3.2 oz (116.7 kg)   SpO2 98%   BMI 45.56 kg/m  General Appearance: Well nourished, in no apparent distress.  Eyes: PERRLA, EOMs, conjunctiva no swelling or erythema, normal fundi and vessels.  Sinuses: No Frontal/maxillary tenderness  ENT/Mouth: Ext aud canals clear, normal light reflex with TMs without erythema, bulging. Good dentition. No erythema, swelling, or exudate on post pharynx. Tonsils not swollen or erythematous. Hearing normal.  Neck: Supple, thyroid normal. No bruits  Respiratory: Respiratory effort normal, BS equal bilaterally without rales, rhonchi, wheezing or stridor.  Cardio: RRR with 2/6 systolic murmurs RSB, without rubs or gallops. Brisk peripheral pulses with mild edema.  Chest: symmetric, with normal excursions and  percussion.  Breasts: declines, states getting MGM Abdomen: Soft, nontender, no guarding, rebound, hernias, masses, or organomegaly. .  Lymphatics: Non tender without lymphadenopathy.  Genitourinary: defer Musculoskeletal: Full ROM all peripheral extremities,5/5 strength, and normal gait.  Skin: Vitiligo  bilateral hands and feet. Right lateral leg with skin abrasion, healing well, no discharge, pink granulation tissue. Warm, dry without rashes, lesions, ecchymosis. Neuro: Cranial nerves intact, reflexes equal bilaterally. Normal muscle tone, no cerebellar symptoms. Sensation intact.  Psych: Awake and oriented X 3, normal affect, Insight and Judgment appropriate.    Medicare Attestation I have personally reviewed: The patient's medical and social history Their use of alcohol, tobacco or illicit drugs Their current medications and supplements The patient's functional ability including ADLs,fall risks, home safety risks, cognitive, and hearing and visual impairment Diet and physical activities Evidence for depression or mood disorders  The patient's weight, height, BMI, and visual acuity have been recorded in the chart.  I have made referrals, counseling, and provided education to the patient based on review of the above and I have provided the patient with a written personalized care plan for preventive services.      Vicie Mutters 9:58 AM [facility]

## 2017-11-17 NOTE — Patient Instructions (Addendum)
Omron wrist cuff bring in your cuff for a nurse visit  Monitor your blood pressure at home. Go to the ER if any CP, SOB, nausea, dizziness, severe HA, changes vision/speech  Goal BP:  For patients younger than 60: Goal BP < 140/90. For patients 60 and older: Goal BP < 150/90. For patients with diabetes: Goal BP < 140/90. Your most recent BP: BP: (!) 150/90(left arm)   Take your medications faithfully as instructed. Maintain a healthy weight. Get at least 150 minutes of aerobic exercise per week. Minimize salt intake. Minimize alcohol intake  DASH Eating Plan DASH stands for "Dietary Approaches to Stop Hypertension." The DASH eating plan is a healthy eating plan that has been shown to reduce high blood pressure (hypertension). Additional health benefits may include reducing the risk of type 2 diabetes mellitus, heart disease, and stroke. The DASH eating plan may also help with weight loss. WHAT DO I NEED TO KNOW ABOUT THE DASH EATING PLAN? For the DASH eating plan, you will follow these general guidelines:  Choose foods with a percent daily value for sodium of less than 5% (as listed on the food label).  Use salt-free seasonings or herbs instead of table salt or sea salt.  Check with your health care provider or pharmacist before using salt substitutes.  Eat lower-sodium products, often labeled as "lower sodium" or "no salt added."  Eat fresh foods.  Eat more vegetables, fruits, and low-fat dairy products.  Choose whole grains. Look for the word "whole" as the first word in the ingredient list.  Choose fish and skinless chicken or [place] more often than red meat. Limit fish, poultry, and meat to 6 oz (170 g) each day.  Limit sweets, desserts, sugars, and sugary drinks.  Choose heart-healthy fats.  Limit cheese to 1 oz (28 g) per day.  Eat more home-cooked food and less restaurant, buffet, and fast food.  Limit fried foods.  Cook foods using methods other than  frying.  Limit canned vegetables. If you do use them, rinse them well to decrease the sodium.  When eating at a restaurant, ask that your food be prepared with less salt, or no salt if possible. WHAT FOODS CAN I EAT? Seek help from a dietitian for individual calorie needs. Grains Whole grain or whole wheat bread. Brown rice. Whole grain or whole wheat pasta. Quinoa, bulgur, and whole grain cereals. Low-sodium cereals. Corn or whole wheat flour tortillas. Whole grain cornbread. Whole grain crackers. Low-sodium crackers. Vegetables Fresh or frozen vegetables (raw, steamed, roasted, or grilled). Low-sodium or reduced-sodium tomato and vegetable juices. Low-sodium or reduced-sodium tomato sauce and paste. Low-sodium or reduced-sodium canned vegetables.  Fruits All fresh, canned (in natural juice), or frozen fruits. Meat and Other Protein Products Ground beef (85% or leaner), grass-fed beef, or beef trimmed of fat. Skinless chicken or [place]. Ground chicken or [place]. Pork trimmed of fat. All fish and seafood. Eggs. Dried beans, peas, or lentils. Unsalted nuts and seeds. Unsalted canned beans. Dairy Low-fat dairy products, such as skim or 1% milk, 2% or reduced-fat cheeses, low-fat ricotta or cottage cheese, or plain low-fat yogurt. Low-sodium or reduced-sodium cheeses. Fats and Oils Tub margarines without trans fats. Light or reduced-fat mayonnaise and salad dressings (reduced sodium). Avocado. Safflower, olive, or canola oils. Natural peanut or almond butter. Other Unsalted popcorn and pretzels. The items listed above may not be a complete list of recommended foods or beverages. Contact your dietitian for more options. WHAT FOODS ARE NOT RECOMMENDED? Grains White  bread. White pasta. White rice. Refined cornbread. Bagels and croissants. Crackers that contain trans fat. Vegetables Creamed or fried vegetables. Vegetables in a cheese sauce. Regular canned vegetables. Regular canned tomato sauce  and paste. Regular tomato and vegetable juices. Fruits Dried fruits. Canned fruit in light or heavy syrup. Fruit juice. Meat and Other Protein Products Fatty cuts of meat. Ribs, chicken wings, bacon, sausage, bologna, salami, chitterlings, fatback, hot dogs, bratwurst, and packaged luncheon meats. Salted nuts and seeds. Canned beans with salt. Dairy Whole or 2% milk, cream, half-and-half, and cream cheese. Whole-fat or sweetened yogurt. Full-fat cheeses or blue cheese. Nondairy creamers and whipped toppings. Processed cheese, cheese spreads, or cheese curds. Condiments Onion and garlic salt, seasoned salt, table salt, and sea salt. Canned and packaged gravies. Worcestershire sauce. Tartar sauce. Barbecue sauce. Teriyaki sauce. Soy sauce, including reduced sodium. Steak sauce. Fish sauce. Oyster sauce. Cocktail sauce. Horseradish. Ketchup and mustard. Meat flavorings and tenderizers. Bouillon cubes. Hot sauce. Tabasco sauce. Marinades. Taco seasonings. Relishes. Fats and Oils Butter, stick margarine, lard, shortening, ghee, and bacon fat. Coconut, palm kernel, or palm oils. Regular salad dressings. Other Pickles and olives. Salted popcorn and pretzels. The items listed above may not be a complete list of foods and beverages to avoid. Contact your dietitian for more information. WHERE CAN I FIND MORE INFORMATION? National Heart, Lung, and Blood Institute: travelstabloid.com Document Released: 11/27/2011 Document Revised: 04/24/2014 Document Reviewed: 10/12/2013 [facility] Patient Information 2015 [place], [place]. This information is not intended to replace advice given to you by your health care provider. Make sure you discuss any questions you have with your health care provider.   Please remember only take the insulin WITH food, if you are sick or unable to eat DO NOT take your insulin. Also a low blood sugar is much more dangerous than a high blood sugar. Your  brain needs 2 things, oxygen and sugar, so lets make sure it gets both. If at any time you have a question or concern, call the office or message [place] in [place].    Your A1C is a measure of your sugar over the past 3 months and is not affected by what you have eaten over the past few days. Diabetes increases your chances of stroke and heart attack over 300 % and is the leading cause of blindness and kidney failure in the [place]. Please make sure you decrease bad carbs like white bread, white rice, potatoes, corn, soft drinks, pasta, cereals, refined sugars, sweet tea, dried fruits, and fruit juice. Good carbs are okay to eat in moderation like sweet potatoes, brown rice, whole grain pasta/bread, most fruit (except dried fruit) and you can eat as many veggies as you want.   Greater than 6.5 is considered diabetic. Between 6.4 and 5.7 is prediabetic If your A1C is less than 5.7 you are NOT diabetic.  Targets for Glucose Readings: Time of Check Target for patients WITHOUT Diabetes Target for DIABETICS  Before Meals Less than 100  less than 150  Two hours after meals Less than 200  Less than 250   Being dehydrated can hurt your kidneys, cause fatigue, headaches, muscle aches, joint pain, and dry skin/nails so please increase your fluids.   Drink 80-100 oz a day of water, measure it out! Eat 3 meals a day, have to do breakfast, eat protein- hard boiled eggs, protein bar like nature [place] protein bar, greek yogurt like oikos triple zero, chobani 100, or light n fit greek  Can check out  plantnanny app on your phone to help you keep track of your water

## 2017-11-18 LAB — BASIC METABOLIC PANEL WITH GFR
BUN / CREAT RATIO: 13 (calc) (ref 6–22)
BUN: 16 mg/dL (ref 7–25)
CHLORIDE: 102 mmol/L (ref 98–110)
CO2: 30 mmol/L (ref 20–32)
CREATININE: 1.23 mg/dL — AB (ref 0.60–0.93)
Calcium: 9.4 mg/dL (ref 8.6–10.4)
GFR, EST AFRICAN AMERICAN: 51 mL/min/{1.73_m2} — AB (ref >=60)
GFR, Est Non African American: 44 mL/min/{1.73_m2} — ABNORMAL LOW (ref >=60)
Glucose, Bld: 116 mg/dL — ABNORMAL HIGH (ref 65–99)
Potassium: 4.2 mmol/L (ref 3.5–5.3)
Sodium: 141 mmol/L (ref 135–146)

## 2017-11-18 LAB — LIPID PANEL
CHOL/HDL RATIO: 4.2 (calc) (ref <5.0)
Cholesterol: 185 mg/dL (ref <200)
HDL: 44 mg/dL — ABNORMAL LOW (ref >50)
LDL Cholesterol (Calc): 113 mg/dL (calc) — ABNORMAL HIGH
NON-HDL CHOLESTEROL (CALC): 141 mg/dL — AB (ref <130)
Triglycerides: 160 mg/dL — ABNORMAL HIGH (ref <150)

## 2017-11-18 LAB — CBC WITH DIFFERENTIAL/PLATELET
BASOS ABS: 73 {cells}/uL (ref 0–200)
BASOS PCT: 0.9 %
EOS ABS: 219 {cells}/uL (ref 15–500)
Eosinophils Relative: 2.7 %
HCT: 35.4 % (ref 35.0–45.0)
Hemoglobin: 11.5 g/dL — ABNORMAL LOW (ref 11.7–15.5)
Lymphs Abs: 2414 cells/uL (ref 850–3900)
MCH: 28.5 pg (ref 27.0–33.0)
MCHC: 32.5 g/dL (ref 32.0–36.0)
MCV: 87.6 fL (ref 80.0–100.0)
MPV: 11.7 fL (ref 7.5–12.5)
Monocytes Relative: 6.8 %
Neutro Abs: 4844 cells/uL (ref 1500–7800)
Neutrophils Relative %: 59.8 %
PLATELETS: 395 10*3/uL (ref 140–400)
RBC: 4.04 10*6/uL (ref 3.80–5.10)
RDW: 14.7 % (ref 11.0–15.0)
TOTAL LYMPHOCYTE: 29.8 %
WBC: 8.1 10*3/uL (ref 3.8–10.8)
WBCMIX: 551 {cells}/uL (ref 200–950)

## 2017-11-18 LAB — URINALYSIS, ROUTINE W REFLEX MICROSCOPIC
Bacteria, UA: NONE SEEN /HPF
Bilirubin Urine: NEGATIVE
GLUCOSE, UA: NEGATIVE
HYALINE CAST: NONE SEEN /LPF
Hgb urine dipstick: NEGATIVE
Ketones, ur: NEGATIVE
Nitrite: NEGATIVE
PH: 6.5 (ref 5.0–8.0)
Protein, ur: NEGATIVE
RBC / HPF: NONE SEEN /HPF (ref 0–2)
SPECIFIC GRAVITY, URINE: 1.007 (ref 1.001–1.03)

## 2017-11-18 LAB — URIC ACID: Uric Acid, Serum: 6.6 mg/dL (ref 2.5–7.0)

## 2017-11-18 LAB — HEPATIC FUNCTION PANEL
AG RATIO: 1.3 (calc) (ref 1.0–2.5)
ALT: 14 U/L (ref 6–29)
AST: 18 U/L (ref 10–35)
Albumin: 4.3 g/dL (ref 3.6–5.1)
Alkaline phosphatase (APISO): 74 U/L (ref 33–130)
BILIRUBIN DIRECT: 0.2 mg/dL (ref 0.0–0.2)
BILIRUBIN INDIRECT: 0.6 mg/dL (ref 0.2–1.2)
GLOBULIN: 3.2 g/dL (ref 1.9–3.7)
Total Bilirubin: 0.8 mg/dL (ref 0.2–1.2)
Total Protein: 7.5 g/dL (ref 6.1–8.1)

## 2017-11-18 LAB — URINE CULTURE
MICRO NUMBER:: 81331451
RESULT: NO GROWTH
SPECIMEN QUALITY:: ADEQUATE

## 2017-11-18 LAB — HEMOGLOBIN A1C
EAG (MMOL/L): 6.3 (calc)
Hgb A1c MFr Bld: 5.6 % of total Hgb (ref <5.7)
Mean Plasma Glucose: 114 (calc)

## 2017-11-18 LAB — MAGNESIUM: MAGNESIUM: 2.1 mg/dL (ref 1.5–2.5)

## 2017-11-18 LAB — TSH: TSH: 84.45 m[IU]/L — AB (ref 0.40–4.50)

## 2017-11-18 NOTE — Progress Notes (Signed)
Pt aware of lab results & voiced understanding of those results. Pt reports she takes her thyroid meds as follows: takes 1 tablet 6 days a wk & on Sunday she takes 1 and half tablet. Message sent to front office for 1 mth lab only visit.

## 2017-11-20 NOTE — Progress Notes (Signed)
Pt aware of lab results & voiced understanding of those results.

## 2017-11-23 ENCOUNTER — Encounter (INDEPENDENT_AMBULATORY_CARE_PROVIDER_SITE_OTHER): Payer: Medicare Other | Admitting: Ophthalmology

## 2017-11-23 ENCOUNTER — Other Ambulatory Visit: Payer: Self-pay | Admitting: *Deleted

## 2017-11-23 DIAGNOSIS — I10 Essential (primary) hypertension: Secondary | ICD-10-CM | POA: Diagnosis not present

## 2017-11-23 DIAGNOSIS — E11319 Type 2 diabetes mellitus with unspecified diabetic retinopathy without macular edema: Secondary | ICD-10-CM | POA: Diagnosis not present

## 2017-11-23 DIAGNOSIS — H43813 Vitreous degeneration, bilateral: Secondary | ICD-10-CM

## 2017-11-23 DIAGNOSIS — H34812 Central retinal vein occlusion, left eye, with macular edema: Secondary | ICD-10-CM | POA: Diagnosis not present

## 2017-11-23 DIAGNOSIS — E113291 Type 2 diabetes mellitus with mild nonproliferative diabetic retinopathy without macular edema, right eye: Secondary | ICD-10-CM | POA: Diagnosis not present

## 2017-11-23 DIAGNOSIS — H35033 Hypertensive retinopathy, bilateral: Secondary | ICD-10-CM

## 2017-11-23 MED ORDER — GLUCOSE BLOOD VI STRP: ORAL_STRIP | 12 refills | Status: DC

## 2017-11-27 ENCOUNTER — Other Ambulatory Visit: Payer: Self-pay | Admitting: Physician Assistant

## 2017-12-04 ENCOUNTER — Other Ambulatory Visit: Payer: Self-pay | Admitting: Internal Medicine

## 2017-12-05 ENCOUNTER — Other Ambulatory Visit: Payer: Self-pay | Admitting: Internal Medicine

## 2017-12-05 MED ORDER — METFORMIN HCL ER 500 MG PO TB24: ORAL_TABLET | ORAL | 1 refills | Status: DC

## 2017-12-07 ENCOUNTER — Ambulatory Visit (INDEPENDENT_AMBULATORY_CARE_PROVIDER_SITE_OTHER): Payer: Medicare Other | Admitting: *Deleted

## 2017-12-07 DIAGNOSIS — E039 Hypothyroidism, unspecified: Secondary | ICD-10-CM

## 2017-12-07 NOTE — Addendum Note (Signed)
Addended by: Melbourne Abts C on: 12/07/2017 08:49 AM   Modules accepted: Level of Service

## 2017-12-07 NOTE — Progress Notes (Signed)
Patient here for a NV to check a TSH.  She states she is taking Levothyroxine 125 mcg 1 tablet every morning before breakfast with water. The patient brought in her BP monitors and the new cuff is defective.  Her BP was 134/82, which was very close to the reading on her older BP monitor.

## 2017-12-08 LAB — TSH: TSH: 19.34 mIU/L — ABNORMAL HIGH (ref 0.40–4.50)

## 2017-12-09 NOTE — Progress Notes (Signed)
Pt aware of lab results & voiced understanding of those results. Pt was then transferred to front office to schedule 40mth nurse visit .

## 2018-01-01 ENCOUNTER — Encounter (INDEPENDENT_AMBULATORY_CARE_PROVIDER_SITE_OTHER): Payer: Medicare Other | Admitting: Ophthalmology

## 2018-01-01 DIAGNOSIS — H34812 Central retinal vein occlusion, left eye, with macular edema: Secondary | ICD-10-CM | POA: Diagnosis not present

## 2018-01-01 DIAGNOSIS — H43813 Vitreous degeneration, bilateral: Secondary | ICD-10-CM

## 2018-01-01 DIAGNOSIS — H35033 Hypertensive retinopathy, bilateral: Secondary | ICD-10-CM

## 2018-01-01 DIAGNOSIS — E11319 Type 2 diabetes mellitus with unspecified diabetic retinopathy without macular edema: Secondary | ICD-10-CM

## 2018-01-01 DIAGNOSIS — E113291 Type 2 diabetes mellitus with mild nonproliferative diabetic retinopathy without macular edema, right eye: Secondary | ICD-10-CM | POA: Diagnosis not present

## 2018-01-01 DIAGNOSIS — I10 Essential (primary) hypertension: Secondary | ICD-10-CM

## 2018-01-04 ENCOUNTER — Encounter (INDEPENDENT_AMBULATORY_CARE_PROVIDER_SITE_OTHER): Payer: Medicare Other | Admitting: Ophthalmology

## 2018-01-11 ENCOUNTER — Ambulatory Visit (INDEPENDENT_AMBULATORY_CARE_PROVIDER_SITE_OTHER): Payer: Medicare Other

## 2018-01-11 DIAGNOSIS — E039 Hypothyroidism, unspecified: Secondary | ICD-10-CM

## 2018-01-11 NOTE — Progress Notes (Signed)
Pt presents for 68mth nurse visit. Pt reports she takes her thyroid meds on an empty stomach as follows on Tues & Thurs she takes 1 & half tablet. On the other 5 days the pt takes 1 tablet. Pt had no concerns at this time.

## 2018-01-12 LAB — TSH: TSH: 0.37 m[IU]/L — AB (ref 0.40–4.50)

## 2018-01-18 ENCOUNTER — Other Ambulatory Visit: Payer: Self-pay | Admitting: Internal Medicine

## 2018-02-12 ENCOUNTER — Ambulatory Visit (INDEPENDENT_AMBULATORY_CARE_PROVIDER_SITE_OTHER): Payer: Medicare Other

## 2018-02-12 DIAGNOSIS — E039 Hypothyroidism, unspecified: Secondary | ICD-10-CM | POA: Diagnosis not present

## 2018-02-12 LAB — TSH: TSH: 0.33 m[IU]/L — AB (ref 0.40–4.50)

## 2018-02-12 NOTE — Progress Notes (Signed)
Pt reports for Lab blood work for TSH. Pt reports she take her thyroid meds as follows___on every day except for TUES she takes 1 whole tablet & on TUES she takes 1 and half tablet on an EMPTY stomach w/only water and waits at least an hour before eating. Pt had nothing else to report.

## 2018-02-16 ENCOUNTER — Encounter (INDEPENDENT_AMBULATORY_CARE_PROVIDER_SITE_OTHER): Payer: Medicare Other | Admitting: Ophthalmology

## 2018-02-16 DIAGNOSIS — E113291 Type 2 diabetes mellitus with mild nonproliferative diabetic retinopathy without macular edema, right eye: Secondary | ICD-10-CM

## 2018-02-16 DIAGNOSIS — I10 Essential (primary) hypertension: Secondary | ICD-10-CM | POA: Diagnosis not present

## 2018-02-16 DIAGNOSIS — H2511 Age-related nuclear cataract, right eye: Secondary | ICD-10-CM

## 2018-02-16 DIAGNOSIS — H35033 Hypertensive retinopathy, bilateral: Secondary | ICD-10-CM | POA: Diagnosis not present

## 2018-02-16 DIAGNOSIS — H43813 Vitreous degeneration, bilateral: Secondary | ICD-10-CM

## 2018-02-16 DIAGNOSIS — E11319 Type 2 diabetes mellitus with unspecified diabetic retinopathy without macular edema: Secondary | ICD-10-CM | POA: Diagnosis not present

## 2018-02-16 DIAGNOSIS — H34812 Central retinal vein occlusion, left eye, with macular edema: Secondary | ICD-10-CM | POA: Diagnosis not present

## 2018-02-18 ENCOUNTER — Other Ambulatory Visit: Payer: Self-pay | Admitting: Internal Medicine

## 2018-03-11 NOTE — Patient Instructions (Signed)

## 2018-03-11 NOTE — Progress Notes (Signed)
This very nice 72 y.o. Morbidly Obese  MBF  presents for 3 month follow up with HTN, HLD, Insulin Dependent T2_DM and Vitamin D Deficiency. She also has OSA - poorly compliant with CPAP.  She also has concerns about 10 # weight gain with increased swelling of her legs.  Patient has hx/o Gout controlled on meds.     Patient is treated for HTN & BP has been controlled at home. Today's BP is at goal - 122/66. Patient has had no complaints of any cardiac type chest pain, palpitations, dyspnea / orthopnea / PND, dizziness, claudication, or dependent edema.     Hyperlipidemia is controlled with diet & meds. Patient denies myalgias or other med SE's. Last Lipids were  Lab Results  Component Value Date   CHOL 185 11/17/2017   HDL 44 (L) 11/17/2017   LDLCALC 113 (H) 11/17/2017   TRIG 160 (H) 11/17/2017   CHOLHDL 4.2 11/17/2017      Also, the patient has history of  Morbid Obesity ( BMI 44+) and T2_DM (2007) and Insulin Requiring since Aug 2018 w/CKD3 (GFR 45) and has had no symptoms of reactive hypoglycemia, diabetic polys, paresthesias or visual blurring.  Last A1c was 5.6% at goal and had been >14% 7 months ago! When she was started on Insulin after an ER visit with Glucose of 650 MG%.       Patient has been on Thyroid replacement since 2004. Further, the patient also has history of Vitamin D Deficiency ("38"/2016) and supplements vitamin D without any suspected side-effects. Last vitamin D was at goal:  Lab Results  Component Value Date   VD25OH 61 08/04/2017   Current Outpatient Medications on File Prior to Visit  Medication Sig  . allopurinol (ZYLOPRIM) 300 MG tablet take 1 tablet by mouth once daily  . atenolol (TENORMIN) 100 MG tablet TAKE ONE TAB  DAILY   . BESIVANCE 0.6 % SUSP Place 1 drop into the left eye 4 (four) times daily.   Marland Kitchen VIT D3 5000 units CAPS Take 10,000 Units by mouth daily.  . colchicine 0.6 MG tablet Take 1 tablet (0.6 mg total) by mouth daily.  . diazepam 5 MG  tablet TAKE ONE TABLET BY MOUTH AT BEDTIME   . enalapril 20 MG tablet TAKE ONE TABDAILY FOR BLOOD PRESSURE  . ferrous fumarate  325 MG  Take 325 mg of iron by mouth daily.   Marland Kitchen  40 MG tablet TAKE ONE TAB TWICE DAILY                        . hyoscyamine 0.125 MG tablet Take 1 tab every 6  hours as needed.  Marland Kitchen NOVOLIN 70/30 Inject 25 units in the AM and 10 units in the PM.  . levothyroxine  125 MCG tablet TAKE ONE TAB DAILY  . meloxicam (MOBIC) 15 MG tablet Take 15 mg by mouth daily as needed for pain.  . metFORMIN-XR 500 MG  Take 2 tablets 2 x/ day with meals for DIABETES  . naproxen  500 MG tablet TAKE ONE TAB 2 x/ DAILY WITH MEALS  . Tumeric with Black Pepper Take 1 tablet by mouth daily.   Marland Kitchen K-DUR 10 MEQ tablet take 1 tablet by mouth once daily    Allergies  Allergen Reactions  . Penicillins Swelling  . Sulfa Antibiotics Itching and Rash   PMHx:   Past Medical History:  Diagnosis Date  . Allergic rhinitis   .  Allergy   . Anxiety    with medical procedures  . Hyperlipidemia   . Hypertension   . Obesity   . OSA (obstructive sleep apnea)    wears CPAP  . Type II or unspecified type diabetes mellitus with renal manifestations, not stated as uncontrolled(250.40)   . Unspecified hypothyroidism   . Vitamin D deficiency    Immunization History  Administered Date(s) Administered  . DT 06/19/2015  . DTaP 12/09/2004  . Pneumococcal Conjugate-13 12/19/2014  . Pneumococcal Polysaccharide-23 03/02/2009   Past Surgical History:  Procedure Laterality Date  .  eye procedure     have treatment where a needle is stuck into left eye- since June 2014; now every six weeks  . DENTAL EXAMINATION UNDER ANESTHESIA    . TUBAL LIGATION     FHx:    Reviewed / unchanged  SHx:    Reviewed / unchanged   Systems Review:  Constitutional: Denies fever, chills, wt changes, headaches, insomnia, fatigue, night sweats, change in appetite. Eyes: Denies redness, blurred vision, diplopia, discharge,  itchy, watery eyes.  ENT: Denies discharge, congestion, post nasal drip, epistaxis, sore throat, earache, hearing loss, dental pain, tinnitus, vertigo, sinus pain, snoring.  CV: Denies chest pain, palpitations, irregular heartbeat, syncope, dyspnea, diaphoresis, orthopnea, PND, claudication or edema. Respiratory: denies cough, dyspnea, DOE, pleurisy, hoarseness, laryngitis, wheezing.  Gastrointestinal: Denies dysphagia, odynophagia, heartburn, reflux, water brash, abdominal pain or cramps, nausea, vomiting, bloating, diarrhea, constipation, hematemesis, melena, hematochezia  or hemorrhoids. Genitourinary: Denies dysuria, frequency, urgency, nocturia, hesitancy, discharge, hematuria or flank pain. Musculoskeletal: Denies arthralgias, myalgias, stiffness, jt. swelling, pain, limping or strain/sprain.  Skin: Denies pruritus, rash, hives, warts, acne, eczema or change in skin lesion(s). Neuro: No weakness, tremor, incoordination, spasms, paresthesia or pain. Psychiatric: Denies confusion, memory loss or sensory loss. Endo: Denies change in weight, skin or hair change.  Heme/Lymph: No excessive bleeding, bruising or enlarged lymph nodes.  Physical Exam  BP 122/66   Pulse 88   Temp (!) 97.5 F (36.4 C)   Resp 16   Ht 5\' 3"  (1.6 m)   Wt 252 lb (114.3 kg)   BMI 44.64 kg/m   Appears over nourished  and in no distress.  Eyes: PERRLA, EOMs, conjunctiva no swelling or erythema. Sinuses: No frontal/maxillary tenderness ENT/Mouth: EAC's clear, TM's nl w/o erythema, bulging. Nares clear w/o erythema, swelling, exudates. Oropharynx clear without erythema or exudates. Oral hygiene is good. Tongue normal, non obstructing. Hearing intact.  Neck: Supple. Thyroid not palpable. Car 2+/2+ without bruits, nodes or JVD. Chest: Respirations nl with BS clear & equal w/o rales, rhonchi, wheezing or stridor.  Cor: Heart sounds normal w/ regular rate and rhythm without sig. murmurs, gallops, clicks or rubs.  Peripheral pulses normal and equal  With 2-3 or 4+ edema to the proximal shin and tender erythema.Neg Homadc  Abdomen: Soft & bowel sounds normal. Non-tender w/o guarding, rebound, hernias, masses or organomegaly.  Lymphatics: Unremarkable.  Musculoskeletal: Full ROM all peripheral extremities, joint stability, 5/5 strength and normal gait.  Skin: Warm, dry without exposed rashes, lesions or ecchymosis apparent.  Neuro: Cranial nerves intact, reflexes equal bilaterally. Sensory-motor testing grossly intact. Tendon reflexes grossly intact.  Pysch: Alert & oriented x 3.  Insight and judgement nl & appropriate. No ideations.  Assessment and Plan:  1. Essential hypertension  - Advided increase Lasix 40 mg to 2 tabs = 80 mg bid & monoitor   - Continue medication, monitor blood pressure at home.  - Continue DASH diet. Reminder  to go to the ER if any CP,  SOB, nausea, dizziness, severe HA, changes vision/speech.  - CBC with Differential/Platelet - Hepatic function panel - Magnesium - TSH  2. Hyperlipidemia, mixed  - Continue diet/meds, exercise,& lifestyle modifications.  - Continue monitor periodic cholesterol/liver & renal functions   - BASIC METABOLIC PANEL WITH GFR - TSH - Lipid panel  3. Type 2 diabetes mellitus with stage 2 chronic kidney disease, with long-term current use of insulin (HCC)  - Continue diet, exercise, lifestyle modifications.  - Monitor appropriate labs.  - Hemoglobin A1c  4. Vitamin D deficiency  - Continue supplementation.  - VITAMIN D 25 Hydroxyl  5. Hypothyroidism  - TSH  6. Chronic gout   - Uric acid  7. Medication management  - CBC with Differential/Platelet - BASIC METABOLIC PANEL WITH GFR - Hepatic function panel - Magnesium - TSH - Hemoglobin A1c - VITAMIN D 25 Hydroxyl - Lipid panel - Uric acid       Discussed  regular exercise, BP monitoring, weight control to achieve/maintain BMI less than 25 and discussed med and SE's.  Recommended labs to assess and monitor clinical status with further disposition pending results of labs. Over 30 minutes of exam, counseling, chart review was performed.

## 2018-03-12 ENCOUNTER — Ambulatory Visit (INDEPENDENT_AMBULATORY_CARE_PROVIDER_SITE_OTHER): Payer: Medicare Other | Admitting: Internal Medicine

## 2018-03-12 ENCOUNTER — Encounter: Payer: Self-pay | Admitting: Internal Medicine

## 2018-03-12 VITALS — BP 122/66 | HR 88 | Temp 97.5°F | Resp 16 | Ht 63.0 in | Wt 252.0 lb

## 2018-03-12 DIAGNOSIS — I10 Essential (primary) hypertension: Secondary | ICD-10-CM | POA: Diagnosis not present

## 2018-03-12 DIAGNOSIS — Z79899 Other long term (current) drug therapy: Secondary | ICD-10-CM

## 2018-03-12 DIAGNOSIS — E782 Mixed hyperlipidemia: Secondary | ICD-10-CM | POA: Diagnosis not present

## 2018-03-12 DIAGNOSIS — E1122 Type 2 diabetes mellitus with diabetic chronic kidney disease: Secondary | ICD-10-CM | POA: Diagnosis not present

## 2018-03-12 DIAGNOSIS — E559 Vitamin D deficiency, unspecified: Secondary | ICD-10-CM | POA: Diagnosis not present

## 2018-03-12 DIAGNOSIS — Z794 Long term (current) use of insulin: Secondary | ICD-10-CM

## 2018-03-12 DIAGNOSIS — N182 Chronic kidney disease, stage 2 (mild): Secondary | ICD-10-CM

## 2018-03-12 DIAGNOSIS — E039 Hypothyroidism, unspecified: Secondary | ICD-10-CM | POA: Diagnosis not present

## 2018-03-12 DIAGNOSIS — M1A9XX0 Chronic gout, unspecified, without tophus (tophi): Secondary | ICD-10-CM

## 2018-03-12 MED ORDER — CEPHALEXIN 500 MG PO CAPS: ORAL_CAPSULE | ORAL | 0 refills | Status: DC

## 2018-03-13 LAB — BASIC METABOLIC PANEL WITH GFR
BUN / CREAT RATIO: 18 (calc) (ref 6–22)
BUN: 24 mg/dL (ref 7–25)
CO2: 28 mmol/L (ref 20–32)
CREATININE: 1.3 mg/dL — AB (ref 0.60–0.93)
Calcium: 9.6 mg/dL (ref 8.6–10.4)
Chloride: 104 mmol/L (ref 98–110)
GFR, Est African American: 48 mL/min/{1.73_m2} — ABNORMAL LOW (ref >=60)
GFR, Est Non African American: 41 mL/min/{1.73_m2} — ABNORMAL LOW (ref >=60)
GLUCOSE: 166 mg/dL — AB (ref 65–99)
POTASSIUM: 4.4 mmol/L (ref 3.5–5.3)
SODIUM: 141 mmol/L (ref 135–146)

## 2018-03-13 LAB — CBC WITH DIFFERENTIAL/PLATELET
BASOS ABS: 87 {cells}/uL (ref 0–200)
Basophils Relative: 1 %
EOS ABS: 131 {cells}/uL (ref 15–500)
EOS PCT: 1.5 %
HCT: 35.6 % (ref 35.0–45.0)
HEMOGLOBIN: 11.6 g/dL — AB (ref 11.7–15.5)
Lymphs Abs: 2584 cells/uL (ref 850–3900)
MCH: 27.5 pg (ref 27.0–33.0)
MCHC: 32.6 g/dL (ref 32.0–36.0)
MCV: 84.4 fL (ref 80.0–100.0)
MONOS PCT: 5.4 %
MPV: 11.7 fL (ref 7.5–12.5)
NEUTROS ABS: 5429 {cells}/uL (ref 1500–7800)
NEUTROS PCT: 62.4 %
Platelets: 400 10*3/uL (ref 140–400)
RBC: 4.22 10*6/uL (ref 3.80–5.10)
RDW: 14.6 % (ref 11.0–15.0)
TOTAL LYMPHOCYTE: 29.7 %
WBC mixed population: 470 cells/uL (ref 200–950)
WBC: 8.7 10*3/uL (ref 3.8–10.8)

## 2018-03-13 LAB — LIPID PANEL
CHOL/HDL RATIO: 4.2 (calc) (ref <5.0)
CHOLESTEROL: 144 mg/dL (ref <200)
HDL: 34 mg/dL — ABNORMAL LOW (ref >50)
LDL CHOLESTEROL (CALC): 89 mg/dL
Non-HDL Cholesterol (Calc): 110 mg/dL (calc) (ref <130)
TRIGLYCERIDES: 118 mg/dL (ref <150)

## 2018-03-13 LAB — TSH: TSH: 0.37 m[IU]/L — AB (ref 0.40–4.50)

## 2018-03-13 LAB — HEPATIC FUNCTION PANEL
AG RATIO: 1.3 (calc) (ref 1.0–2.5)
ALKALINE PHOSPHATASE (APISO): 89 U/L (ref 33–130)
ALT: 11 U/L (ref 6–29)
AST: 16 U/L (ref 10–35)
Albumin: 4.1 g/dL (ref 3.6–5.1)
BILIRUBIN TOTAL: 0.7 mg/dL (ref 0.2–1.2)
Bilirubin, Direct: 0.2 mg/dL (ref 0.0–0.2)
Globulin: 3.2 g/dL (calc) (ref 1.9–3.7)
Indirect Bilirubin: 0.5 mg/dL (calc) (ref 0.2–1.2)
TOTAL PROTEIN: 7.3 g/dL (ref 6.1–8.1)

## 2018-03-13 LAB — MAGNESIUM: Magnesium: 1.8 mg/dL (ref 1.5–2.5)

## 2018-03-13 LAB — VITAMIN D 25 HYDROXY (VIT D DEFICIENCY, FRACTURES): VIT D 25 HYDROXY: 53 ng/mL (ref 30–100)

## 2018-03-13 LAB — HEMOGLOBIN A1C
Hgb A1c MFr Bld: 6.3 % of total Hgb — ABNORMAL HIGH (ref <5.7)
Mean Plasma Glucose: 134 (calc)
eAG (mmol/L): 7.4 (calc)

## 2018-03-13 LAB — URIC ACID: Uric Acid, Serum: 8.2 mg/dL — ABNORMAL HIGH (ref 2.5–7.0)

## 2018-03-14 ENCOUNTER — Other Ambulatory Visit: Payer: Self-pay | Admitting: Internal Medicine

## 2018-03-14 DIAGNOSIS — E039 Hypothyroidism, unspecified: Secondary | ICD-10-CM

## 2018-03-14 MED ORDER — LEVOTHYROXINE SODIUM 100 MCG PO TABS: ORAL_TABLET | ORAL | 1 refills | Status: DC

## 2018-03-16 LAB — HM DIABETES EYE EXAM

## 2018-03-25 ENCOUNTER — Encounter: Payer: Self-pay | Admitting: *Deleted

## 2018-03-29 ENCOUNTER — Encounter (INDEPENDENT_AMBULATORY_CARE_PROVIDER_SITE_OTHER): Payer: Medicare Other | Admitting: Ophthalmology

## 2018-03-29 DIAGNOSIS — H34812 Central retinal vein occlusion, left eye, with macular edema: Secondary | ICD-10-CM

## 2018-03-29 DIAGNOSIS — H43813 Vitreous degeneration, bilateral: Secondary | ICD-10-CM

## 2018-03-29 DIAGNOSIS — E11319 Type 2 diabetes mellitus with unspecified diabetic retinopathy without macular edema: Secondary | ICD-10-CM

## 2018-03-29 DIAGNOSIS — I10 Essential (primary) hypertension: Secondary | ICD-10-CM | POA: Diagnosis not present

## 2018-03-29 DIAGNOSIS — E113291 Type 2 diabetes mellitus with mild nonproliferative diabetic retinopathy without macular edema, right eye: Secondary | ICD-10-CM | POA: Diagnosis not present

## 2018-03-29 DIAGNOSIS — H35033 Hypertensive retinopathy, bilateral: Secondary | ICD-10-CM | POA: Diagnosis not present

## 2018-03-29 LAB — HM DIABETES EYE EXAM

## 2018-03-30 ENCOUNTER — Ambulatory Visit (INDEPENDENT_AMBULATORY_CARE_PROVIDER_SITE_OTHER): Payer: Medicare Other | Admitting: Internal Medicine

## 2018-03-30 VITALS — BP 128/88 | HR 80 | Temp 97.1°F | Resp 18 | Ht 63.0 in | Wt 252.8 lb

## 2018-03-30 DIAGNOSIS — Z79899 Other long term (current) drug therapy: Secondary | ICD-10-CM

## 2018-03-30 DIAGNOSIS — N183 Chronic kidney disease, stage 3 unspecified: Secondary | ICD-10-CM

## 2018-03-30 DIAGNOSIS — I872 Venous insufficiency (chronic) (peripheral): Secondary | ICD-10-CM | POA: Diagnosis not present

## 2018-03-30 DIAGNOSIS — E039 Hypothyroidism, unspecified: Secondary | ICD-10-CM

## 2018-03-30 DIAGNOSIS — Z794 Long term (current) use of insulin: Secondary | ICD-10-CM

## 2018-03-30 DIAGNOSIS — I10 Essential (primary) hypertension: Secondary | ICD-10-CM

## 2018-03-30 DIAGNOSIS — E1122 Type 2 diabetes mellitus with diabetic chronic kidney disease: Secondary | ICD-10-CM

## 2018-03-30 DIAGNOSIS — M1A9XX0 Chronic gout, unspecified, without tophus (tophi): Secondary | ICD-10-CM

## 2018-03-30 LAB — CBC WITH DIFFERENTIAL/PLATELET
BASOS PCT: 0.7 %
Basophils Absolute: 57 cells/uL (ref 0–200)
EOS PCT: 2.6 %
Eosinophils Absolute: 211 cells/uL (ref 15–500)
HCT: 32.9 % — ABNORMAL LOW (ref 35.0–45.0)
Hemoglobin: 10.7 g/dL — ABNORMAL LOW (ref 11.7–15.5)
Lymphs Abs: 2074 cells/uL (ref 850–3900)
MCH: 27.1 pg (ref 27.0–33.0)
MCHC: 32.5 g/dL (ref 32.0–36.0)
MCV: 83.3 fL (ref 80.0–100.0)
MONOS PCT: 6.6 %
MPV: 11.5 fL (ref 7.5–12.5)
Neutro Abs: 5225 cells/uL (ref 1500–7800)
Neutrophils Relative %: 64.5 %
PLATELETS: 338 10*3/uL (ref 140–400)
RBC: 3.95 10*6/uL (ref 3.80–5.10)
RDW: 15.1 % — AB (ref 11.0–15.0)
TOTAL LYMPHOCYTE: 25.6 %
WBC: 8.1 10*3/uL (ref 3.8–10.8)
WBCMIX: 535 {cells}/uL (ref 200–950)

## 2018-03-30 LAB — BASIC METABOLIC PANEL WITH GFR
BUN/Creatinine Ratio: 24 (calc) — ABNORMAL HIGH (ref 6–22)
BUN: 28 mg/dL — ABNORMAL HIGH (ref 7–25)
CALCIUM: 9.3 mg/dL (ref 8.6–10.4)
CHLORIDE: 100 mmol/L (ref 98–110)
CO2: 33 mmol/L — ABNORMAL HIGH (ref 20–32)
Creat: 1.16 mg/dL — ABNORMAL HIGH (ref 0.60–0.93)
GFR, Est African American: 54 mL/min/{1.73_m2} — ABNORMAL LOW (ref >=60)
GFR, Est Non African American: 47 mL/min/{1.73_m2} — ABNORMAL LOW (ref >=60)
GLUCOSE: 140 mg/dL — AB (ref 65–99)
POTASSIUM: 3.9 mmol/L (ref 3.5–5.3)
Sodium: 139 mmol/L (ref 135–146)

## 2018-03-30 LAB — URIC ACID: Uric Acid, Serum: 6.6 mg/dL (ref 2.5–7.0)

## 2018-03-30 LAB — TSH: TSH: 0.51 m[IU]/L (ref 0.40–4.50)

## 2018-03-30 MED ORDER — METOLAZONE 5 MG PO TABS: ORAL_TABLET | ORAL | 3 refills | Status: DC

## 2018-03-30 NOTE — Progress Notes (Signed)
Subjective:    Patient ID: Jocelyn Camacho, female    DOB: 09-Sep-1946, 72 y.o.   MRN: [id number]  HPI  This nice 72 yo MBF w/ HTN, T2 Insulin dep DM - returns for 2 week f/u after doubling Lasix dose from 40 to 80 mg bid for a 10 # weight gain and worsening dependent edema in the absence of any sx's of Lt heart failure as DOE, Orthopnea or PND. Renal functions were stable with CKD3 (GFR 41). Also Thyroid was elevated  (TSH  0.37) and a new dose was dispensed.    Despite increasing Lasix , her weight is the same. She denies any CV sx's.  Medication Sig  . allopurinol (ZYLOPRIM) 300 MG tablet take 1 tablet by mouth once daily  . atenolol (TENORMIN) 100 MG tablet TAKE ONE TABLET BY MOUTH ONCE DAILY (Patient taking differently: TAKE 100 mg BY MOUTH ONCE DAILY)  . BESIVANCE 0.6 % SUSP Place 1 drop into the left eye 4 (four) times daily.   . Blood Glucose Monitoring Suppl (ONE TOUCH ULTRA 2) w/Device KIT check blood sugar 3 times daily-DX-E10.9  . cephALEXin (KEFLEX) 500 MG capsule Take 1 capsule 4 x /day with meals & bedtime for skin infection  . Cholecalciferol (VITAMIN D3) 5000 units CAPS Take 10,000 Units by mouth daily.  . colchicine 0.6 MG tablet Take 1 tablet (0.6 mg total) by mouth daily.  . diazepam (VALIUM) 5 MG tablet TAKE ONE TABLET BY MOUTH AT BEDTIME AS NEEDED FOR ANXIETY (Patient taking differently: TAKE 5 mg BY MOUTH AT BEDTIME AS NEEDED FOR ANXIETY)  . enalapril (VASOTEC) 20 MG tablet TAKE ONE TABLET BY MOUTH ONCE DAILY FOR BLOOD PRESSURE  . ferrous fumarate (HEMOCYTE - 106 MG FE) 325 (106 Fe) MG TABS tablet Take 325 mg of iron by mouth daily.   . furosemide (LASIX) 40 MG tablet TAKE ONE TABLET BY MOUTH TWICE DAILY FOR BLOOD PRESSURE AND  FLUID (Patient taking differently: TAKE 40 mg BY MOUTH TWICE DAILY FOR BLOOD PRESSURE AND FLUID)  . glucose blood (ONE TOUCH ULTRA TEST) test strip Check blood sugar 3 times daily-DX-E11.9 and Z79.4  . hyoscyamine (LEVSIN, ANASPAZ) 0.125 MG tablet Take  1 tablet (0.125 mg total) by mouth every 6 (six) hours as needed.  . insulin NPH-regular Human (NOVOLIN 70/30) (70-30) 100 UNIT/ML injection Inject into the skin. Patient injects 25 units in the AM and 15 units in the PM.  . Lancets (ONETOUCH ULTRASOFT) lancets Use as instructed Dx. Code: E11.22  . Lancets (ONETOUCH ULTRASOFT) lancets Check blood sugar 3 times daily-DX-E10.9  . levothyroxine (SYNTHROID) 100 MCG tablet Take 1 tablet 1st in the morning on an empty stomach with only water for 30 minutes  . meloxicam (MOBIC) 15 MG tablet Take 15 mg by mouth daily as needed for pain.  Marland Kitchen OVER THE COUNTER MEDICATION Take 1 tablet by mouth daily. Tumeric with Black Pepper  . potassium chloride (K-DUR) 10 MEQ tablet take 1 tablet by mouth once daily (Patient taking differently: Take 10 meq by mouth once daily)  . prednisoLONE acetate (PRED FORTE) 1 % ophthalmic suspension INSTILL 1 DROP INTO EACH EYE TWICE DAILY    Allergies  Allergen Reactions  . Penicillins Swelling  . Sulfa Antibiotics Itching and Rash   Past Medical History:  Diagnosis Date  . Allergic rhinitis   . Allergy   . Anxiety    with medical procedures  . Hyperlipidemia   . Hypertension   . Obesity   .  OSA (obstructive sleep apnea)    wears CPAP  . Type II or unspecified type diabetes mellitus with renal manifestations, not stated as uncontrolled(250.40)   . Unspecified hypothyroidism   . Vitamin D deficiency    Review of Systems    10 point systems review negative except as above.    Objective:   Physical Exam  BP 128/88   Pulse 80   Temp (!) 97.1 F (36.2 C)   Resp 18   Ht '5\' 3"'$  (1.6 m)   Wt 252 lb 12.8 oz (114.7 kg)   BMI 44.78 kg/m   HEENT - WNL. Neck - supple.  Chest - Clear equal BS. Cor - Nl HS. RRR w/o sig MGR. PP 1(+). 2(+) pitting ankle & pretibial edema persists.  MS- FROM w/o deformities.  Gait Nl. Neuro -  Nl w/o focal abnormalities.    Assessment & Plan:    1. Essential hypertension  - Add  Zaroxolyn 5 mg for her refractory edema & recheck in 8-10 days.  - CBC with Differential/Platelet - BASIC METABOLIC PANEL WITH GFR  2. Type 2 diabetes mellitus with stage 3 chronic kidney disease, with long-term current use of insulin (HCC)  - BASIC METABOLIC PANEL WITH GFR  3. Gout   - Uric acid  4. Hypothyroidism  - TSH  5. Medication management  - CBC with Differential/Platelet - BASIC METABOLIC PANEL WITH GFR - TSH - Uric acid

## 2018-03-31 ENCOUNTER — Encounter: Payer: Self-pay | Admitting: Internal Medicine

## 2018-03-31 ENCOUNTER — Telehealth: Payer: Self-pay | Admitting: *Deleted

## 2018-03-31 NOTE — Telephone Encounter (Signed)
Patient reported she took the Furosemide and Metolazone and her weight was down 4 pounds later in the morning.  Per Dr Melford Aase, she was advised to weight herself when she gets up tomorrow morning, call with weight and he will instruct on the medications she should take.

## 2018-04-01 ENCOUNTER — Telehealth: Payer: Self-pay | Admitting: *Deleted

## 2018-04-01 NOTE — Telephone Encounter (Signed)
Patient called and reported her weight this AM as 244 #, a 4 # loss since yesterday.  Per Dr Melford Aase, take the Lasix 40 mg 2 tablets in the AM and 2 tablets in the afternoon, but no Metolazone today.

## 2018-04-02 ENCOUNTER — Telehealth: Payer: Self-pay | Admitting: *Deleted

## 2018-04-02 NOTE — Telephone Encounter (Signed)
Patient called and reported her weight was down 1 pound to 243 #.  Per Dr Melford Aase, it is OK to take Metolazone and Furosemide today.  She was advised that if she loses more than 2 # in 24 hours, to hold the Metolazone, but take the Furosemide.

## 2018-04-08 ENCOUNTER — Ambulatory Visit (INDEPENDENT_AMBULATORY_CARE_PROVIDER_SITE_OTHER): Payer: Medicare Other | Admitting: Internal Medicine

## 2018-04-08 VITALS — BP 152/82 | HR 100 | Temp 97.3°F | Resp 18 | Ht 63.0 in | Wt 244.8 lb

## 2018-04-08 DIAGNOSIS — I10 Essential (primary) hypertension: Secondary | ICD-10-CM

## 2018-04-08 DIAGNOSIS — Z79899 Other long term (current) drug therapy: Secondary | ICD-10-CM | POA: Diagnosis not present

## 2018-04-08 DIAGNOSIS — I872 Venous insufficiency (chronic) (peripheral): Secondary | ICD-10-CM

## 2018-04-08 NOTE — Progress Notes (Signed)
Subjective:    Patient ID: Jocelyn Camacho, female    DOB: 1946/01/24, 72 y.o.   MRN: [id number]  HPI  Patient is a very nice 72 yo MBF returning for close [place] f/u after adding Zaroxolyn with Furosemide for refractory dependent edema & is noted to have had an 8 # weight loss since last OV. Otherwise systems review is negative and unremarkable.   Medication Sig  . Allopurinol 300 MG tablet take 1 tablet by mouth once daily  . aspirin EC 81 MG tablet Take 81 mg by mouth 2 (two) times daily.  Marland Kitchen atenolol  100 MG tablet TAKE ONE TAB DAILY   . BESIVANCE 0.6 % SUSP Place 1 drop into the left eye 4 (four) times daily.   Marland Kitchen VITAMIN D  5000 units  Take 10,000 Units daily.  . colchicine 0.6 MG tablet Take 1 tab daily.  . diazepam  5 MG tablet TAKE ONE TAB AT BEDTIME  . enalapril  20 MG tablet TAKE ONE TAB DAILY  . HEMOCYTE 106 MG  Take 325 mg of iron by mouth daily.   . furosemide  40 MG tablet TAKE ONE TAB TWICE DAILY   . hyoscyamine  0.125 MG Take 1 tablet (0.125 mg total) by mouth every 6 (six) hours as needed.  Marland Kitchen NOVOLIN 70/30 injec Inject into the skin. Patient injects 25 units in the AM and 15 units in the PM.  . Levothyroxine 100 MCG Take 1 tablet 1st in the morning on an empty stomach with only water for 30 minutes  . meloxicam 15 MG Take 15 mg by mouth daily as needed for pain.  . metFORMIN-XR 500 MG  Take 2 tablets 2 x/ day with meals for DIABETES  . metolazone 5 MG  Takle 1 tablet daily with Lasix for Fluid Retention % swelling  . naproxen  500 MG tablet TAKE ONE TAB TWICE DAILY WITH MEALS H MEALS as needed for pain)  . Tumeric with Black Pepper Take 1 tablet by mouth daily.   . potassium K-DUR 10 MEQ  take 1 tablet by mouth once daily   . PRED FORTE ophth susp INSTILL 1 DROP INTO EACH EYE TWICE DAILY   Allergies  Allergen Reactions  . Penicillins Swelling  . Sulfa Antibiotics Itching and Rash   Past Medical History:  Diagnosis Date  . Allergic rhinitis   . Allergy   . Anxiety    with medical procedures  . Hyperlipidemia   . Hypertension   . Obesity   . OSA (obstructive sleep apnea)    wears CPAP  . Type II or unspecified type diabetes mellitus with renal manifestations, not stated as uncontrolled(250.40)   . Unspecified hypothyroidism   . Vitamin D deficiency    10 point systems review negative except as above.    Objective:   Physical Exam  BP (!) 152/82   Pulse 100   Temp (!) 97.3 F (36.3 C)   Resp 18   Ht 5\' 3"  (1.6 m)   Wt 244 lb 12.8 oz (111 kg)   BMI 43.36 kg/m   HEENT - WNL. Neck - supple.  Chest - Clear equal BS. Cor - Nl HS. RRR w/o sig MGR. PP obscured by  2-3 (+) LE edema to prox shins MS- FROM w/o deformities.  Gait Nl. Neuro -  Nl w/o focal abnormalities.    Assessment & Plan:   1. Essential hypertension  - CBC with Differential/Platelet - COMPLETE METABOLIC PANEL WITH GFR  2. Venous insufficiency of both lower extremities  - D/C Naprosyn & Meloxicam  3. Medication management  - CBC with Differential/Platelet - COMPLETE METABOLIC PANEL WITH GFR

## 2018-04-09 LAB — CBC WITH DIFFERENTIAL/PLATELET
BASOS ABS: 78 {cells}/uL (ref 0–200)
Basophils Relative: 0.6 %
EOS ABS: 78 {cells}/uL (ref 15–500)
EOS PCT: 0.6 %
HEMATOCRIT: 35.5 % (ref 35.0–45.0)
Hemoglobin: 11.8 g/dL (ref 11.7–15.5)
Lymphs Abs: 2158 cells/uL (ref 850–3900)
MCH: 27.1 pg (ref 27.0–33.0)
MCHC: 33.2 g/dL (ref 32.0–36.0)
MCV: 81.6 fL (ref 80.0–100.0)
MONOS PCT: 7.5 %
MPV: 11.3 fL (ref 7.5–12.5)
NEUTROS PCT: 74.7 %
Neutro Abs: 9711 cells/uL — ABNORMAL HIGH (ref 1500–7800)
Platelets: 398 10*3/uL (ref 140–400)
RBC: 4.35 10*6/uL (ref 3.80–5.10)
RDW: 14.6 % (ref 11.0–15.0)
Total Lymphocyte: 16.6 %
WBC mixed population: 975 cells/uL — ABNORMAL HIGH (ref 200–950)
WBC: 13 10*3/uL — ABNORMAL HIGH (ref 3.8–10.8)

## 2018-04-09 LAB — COMPLETE METABOLIC PANEL WITH GFR
AG Ratio: 1.3 (calc) (ref 1.0–2.5)
ALBUMIN MSPROF: 4.4 g/dL (ref 3.6–5.1)
ALKALINE PHOSPHATASE (APISO): 92 U/L (ref 33–130)
ALT: 11 U/L (ref 6–29)
AST: 14 U/L (ref 10–35)
BILIRUBIN TOTAL: 0.6 mg/dL (ref 0.2–1.2)
BUN / CREAT RATIO: 33 (calc) — AB (ref 6–22)
BUN: 66 mg/dL — AB (ref 7–25)
CHLORIDE: 92 mmol/L — AB (ref 98–110)
CO2: 36 mmol/L — ABNORMAL HIGH (ref 20–32)
Calcium: 10.1 mg/dL (ref 8.6–10.4)
Creat: 1.99 mg/dL — ABNORMAL HIGH (ref 0.60–0.93)
GFR, Est African American: 28 mL/min/{1.73_m2} — ABNORMAL LOW (ref >=60)
GFR, Est Non African American: 24 mL/min/{1.73_m2} — ABNORMAL LOW (ref >=60)
GLOBULIN: 3.5 g/dL (ref 1.9–3.7)
GLUCOSE: 173 mg/dL — AB (ref 65–99)
Potassium: 4.3 mmol/L (ref 3.5–5.3)
SODIUM: 139 mmol/L (ref 135–146)
Total Protein: 7.9 g/dL (ref 6.1–8.1)

## 2018-04-10 ENCOUNTER — Other Ambulatory Visit: Payer: Self-pay | Admitting: Internal Medicine

## 2018-04-10 DIAGNOSIS — N289 Disorder of kidney and ureter, unspecified: Secondary | ICD-10-CM

## 2018-04-11 ENCOUNTER — Encounter: Payer: Self-pay | Admitting: Internal Medicine

## 2018-04-13 ENCOUNTER — Ambulatory Visit (INDEPENDENT_AMBULATORY_CARE_PROVIDER_SITE_OTHER): Payer: Medicare Other | Admitting: Internal Medicine

## 2018-04-13 ENCOUNTER — Encounter: Payer: Self-pay | Admitting: Internal Medicine

## 2018-04-13 VITALS — BP 144/86 | HR 80 | Temp 97.4°F | Resp 16 | Ht 63.0 in | Wt 246.0 lb

## 2018-04-13 DIAGNOSIS — Z79899 Other long term (current) drug therapy: Secondary | ICD-10-CM | POA: Diagnosis not present

## 2018-04-13 DIAGNOSIS — I10 Essential (primary) hypertension: Secondary | ICD-10-CM

## 2018-04-13 DIAGNOSIS — N289 Disorder of kidney and ureter, unspecified: Secondary | ICD-10-CM

## 2018-04-13 NOTE — Progress Notes (Signed)
Subjective:    Patient ID: Jocelyn Camacho, female    DOB: 02-21-1946, 72 y.o.   MRN: [id number]  HPI  This very nice 72 yo MBF with HTN, Insulin req T2_DM & CKD3 and refractory edema returns for close f/u after recent aggressive diuresis by adding Zaroxolyn to her Lasix prompted an 8# weight loss at the expense of worsening renal functions with BUN rising from 28 -->66 , Creat rising from 1.6 --> 1.99 and GFR dropping from 47 -->24. Patient was contacted to stop all diuretics and force fluids. She was also advised to stop/avoid all NSAID's which she relates she had not been taking anyway. She has regained 2# since last OV. Systems review is otherwise negative.   Medication Sig  . allopurinol (ZYLOPRIM) 300 MG tablet take 1 tablet by mouth once daily  . aspirin EC 81 MG tablet Take 81 mg by mouth 2 (two) times daily.  Marland Kitchen atenolol (TENORMIN) 100 MG tablet TAKE ONE TABLET BY MOUTH ONCE DAILY (Patient taking differently: TAKE 100 mg BY MOUTH ONCE DAILY)  . BESIVANCE 0.6 % SUSP Place 1 drop into the left eye 4 (four) times daily.   . Blood Glucose Monitoring Suppl (ONE TOUCH ULTRA 2) w/Device KIT check blood sugar 3 times daily-DX-E10.9  . Cholecalciferol (VITAMIN D3) 5000 units CAPS Take 10,000 Units by mouth daily.  . colchicine 0.6 MG tablet Take 1 tablet (0.6 mg total) by mouth daily.  . diazepam (VALIUM) 5 MG tablet TAKE ONE TABLET BY MOUTH AT BEDTIME AS NEEDED FOR ANXIETY (Patient taking differently: TAKE 5 mg BY MOUTH AT BEDTIME AS NEEDED FOR ANXIETY)  . enalapril (VASOTEC) 20 MG tablet TAKE ONE TABLET BY MOUTH ONCE DAILY FOR BLOOD PRESSURE  . ferrous fumarate (HEMOCYTE - 106 MG FE) 325 (106 Fe) MG TABS tablet Take 325 mg of iron by mouth daily.   Marland Kitchen glucose blood (ONE TOUCH ULTRA TEST) test strip Check blood sugar 3 times daily-DX-E11.9 and Z79.4  . hyoscyamine (LEVSIN, ANASPAZ) 0.125 MG tablet Take 1 tablet (0.125 mg total) by mouth every 6 (six) hours as needed.  . insulin NPH-regular Human  (NOVOLIN 70/30) (70-30) 100 UNIT/ML injection Inject into the skin. Patient injects 25 units in the AM and 15 units in the PM.  . Lancets (ONETOUCH ULTRASOFT) lancets Use as instructed Dx. Code: E11.22  . Lancets (ONETOUCH ULTRASOFT) lancets Check blood sugar 3 times daily-DX-E10.9  . levothyroxine (SYNTHROID) 100 MCG tablet Take 1 tablet 1st in the morning on an empty stomach with only water for 30 minutes  . metFORMIN (GLUCOPHAGE XR) 500 MG 24 hr tablet Take 2 tablets 2 x/ day with meals for DIABETES  . OVER THE COUNTER MEDICATION Take 1 tablet by mouth daily. Tumeric with Black Pepper  . potassium chloride (K-DUR) 10 MEQ tablet take 1 tablet by mouth once daily (Patient taking differently: Take 10 meq by mouth once daily)  . prednisoLONE acetate (PRED FORTE) 1 % ophthalmic suspension INSTILL 1 DROP INTO EACH EYE TWICE DAILY  . furosemide (LASIX) 40 MG tablet TAKE ONE TABLET BY MOUTH TWICE DAILY FOR BLOOD PRESSURE AND  FLUID (Patient not taking: Reported on 04/13/2018)  . meloxicam (MOBIC) 15 MG tablet Take 15 mg by mouth daily as needed for pain.  . metolazone (ZAROXOLYN) 5 MG tablet Takle 1 tablet daily with Lasix for Fluid Retention % swelling (Patient not taking: Reported on 04/13/2018)  . naproxen (NAPROSYN) 500 MG tablet TAKE ONE TABLET BY MOUTH TWICE DAILY WITH MEALS (Patient  not taking: Reported on 04/13/2018)    Allergies  Allergen Reactions  . Penicillins Swelling  . Sulfa Antibiotics Itching and Rash   Past Medical History:  Diagnosis Date  . Allergic rhinitis   . Allergy   . Anxiety    with medical procedures  . Hyperlipidemia   . Hypertension   . Obesity   . OSA (obstructive sleep apnea)    wears CPAP  . Type II or unspecified type diabetes mellitus with renal manifestations, not stated as uncontrolled(250.40)   . Unspecified hypothyroidism   . Vitamin D deficiency    Review of Systems  10 point systems review negative except as above.    Objective:   Physical  Exam  BP (!) 144/86   Pulse 80   Temp (!) 97.4 F (36.3 C)   Resp 16   Ht _0  (1.6 m)   Wt 246 lb (111.6 kg)   BMI 43.58 kg/m   HEENT - WNL. Neck - supple.  Chest - Clear equal BS. Cor - Nl HS. RRR w/o sig m.  2-3 (+) pretibial edema to the prox shins.  MS- FROM w/o deformities.  Gait Nl. Neuro -  Nl w/o focal abnormalities.    Assessment & Plan:   1. Renal insufficiency  - COMPLETE METABOLIC PANEL WITH GFR  2. Essential hypertension  - CBC with Differential/Platelet - COMPLETE METABOLIC PANEL WITH GFR  3. Medication management  - CBC with Differential/Platelet - COMPLETE METABOLIC PANEL WITH GFR  - F/U pending labs

## 2018-04-19 ENCOUNTER — Other Ambulatory Visit: Payer: Self-pay | Admitting: Internal Medicine

## 2018-04-19 DIAGNOSIS — N289 Disorder of kidney and ureter, unspecified: Secondary | ICD-10-CM

## 2018-04-19 DIAGNOSIS — Z1231 Encounter for screening mammogram for malignant neoplasm of breast: Secondary | ICD-10-CM

## 2018-04-19 LAB — COMPLETE METABOLIC PANEL WITH GFR
AG RATIO: 1.3 (calc) (ref 1.0–2.5)
ALT: 12 U/L (ref 6–29)
AST: 17 U/L (ref 10–35)
Albumin: 4.2 g/dL (ref 3.6–5.1)
Alkaline phosphatase (APISO): 86 U/L (ref 33–130)
BILIRUBIN TOTAL: 0.7 mg/dL (ref 0.2–1.2)
BUN/Creatinine Ratio: 39 (calc) — ABNORMAL HIGH (ref 6–22)
BUN: 64 mg/dL — AB (ref 7–25)
CALCIUM: 9.8 mg/dL (ref 8.6–10.4)
CO2: 34 mmol/L — ABNORMAL HIGH (ref 20–32)
Chloride: 93 mmol/L — ABNORMAL LOW (ref 98–110)
Creat: 1.66 mg/dL — ABNORMAL HIGH (ref 0.60–0.93)
GFR, Est African American: 35 mL/min/{1.73_m2} — ABNORMAL LOW (ref >=60)
GFR, Est Non African American: 30 mL/min/{1.73_m2} — ABNORMAL LOW (ref >=60)
GLUCOSE: 171 mg/dL — AB (ref 65–99)
Globulin: 3.3 g/dL (calc) (ref 1.9–3.7)
POTASSIUM: 3.6 mmol/L (ref 3.5–5.3)
Sodium: 136 mmol/L (ref 135–146)
Total Protein: 7.5 g/dL (ref 6.1–8.1)

## 2018-04-19 LAB — CBC WITH DIFFERENTIAL/PLATELET
BASOS PCT: 0.6 %
Basophils Absolute: 57 cells/uL (ref 0–200)
EOS PCT: 1.4 %
Eosinophils Absolute: 133 cells/uL (ref 15–500)
HCT: 33.4 % — ABNORMAL LOW (ref 35.0–45.0)
Hemoglobin: 11.2 g/dL — ABNORMAL LOW (ref 11.7–15.5)
LYMPHS ABS: 1910 {cells}/uL (ref 850–3900)
MCH: 27.1 pg (ref 27.0–33.0)
MCHC: 33.5 g/dL (ref 32.0–36.0)
MCV: 80.7 fL (ref 80.0–100.0)
MPV: 11.1 fL (ref 7.5–12.5)
Monocytes Relative: 7.7 %
NEUTROS PCT: 70.2 %
Neutro Abs: 6669 cells/uL (ref 1500–7800)
PLATELETS: 431 10*3/uL — AB (ref 140–400)
RBC: 4.14 10*6/uL (ref 3.80–5.10)
RDW: 15 % (ref 11.0–15.0)
TOTAL LYMPHOCYTE: 20.1 %
WBC: 9.5 10*3/uL (ref 3.8–10.8)
WBCMIX: 732 {cells}/uL (ref 200–950)

## 2018-04-21 ENCOUNTER — Ambulatory Visit
Admission: RE | Admit: 2018-04-21 | Discharge: 2018-04-21 | Disposition: A | Discharge status: Home / Self Care | Payer: Medicare Other | Source: Ambulatory Visit | Attending: Internal Medicine | Admitting: Internal Medicine

## 2018-04-21 DIAGNOSIS — Z1231 Encounter for screening mammogram for malignant neoplasm of breast: Secondary | ICD-10-CM

## 2018-04-22 ENCOUNTER — Encounter: Payer: Self-pay | Admitting: Physician Assistant

## 2018-04-27 ENCOUNTER — Ambulatory Visit (INDEPENDENT_AMBULATORY_CARE_PROVIDER_SITE_OTHER): Payer: Medicare Other

## 2018-04-27 DIAGNOSIS — N289 Disorder of kidney and ureter, unspecified: Secondary | ICD-10-CM

## 2018-04-27 DIAGNOSIS — Z79899 Other long term (current) drug therapy: Secondary | ICD-10-CM | POA: Diagnosis not present

## 2018-04-27 LAB — BASIC METABOLIC PANEL WITH GFR
BUN / CREAT RATIO: 14 (calc) (ref 6–22)
BUN: 17 mg/dL (ref 7–25)
CO2: 27 mmol/L (ref 20–32)
CREATININE: 1.18 mg/dL — AB (ref 0.60–0.93)
Calcium: 9.1 mg/dL (ref 8.6–10.4)
Chloride: 109 mmol/L (ref 98–110)
GFR, Est African American: 53 mL/min/{1.73_m2} — ABNORMAL LOW (ref >=60)
GFR, Est Non African American: 46 mL/min/{1.73_m2} — ABNORMAL LOW (ref >=60)
Glucose, Bld: 97 mg/dL (ref 65–99)
POTASSIUM: 4.7 mmol/L (ref 3.5–5.3)
SODIUM: 142 mmol/L (ref 135–146)

## 2018-04-27 NOTE — Progress Notes (Signed)
Patient presents to the office for a nurse visit to have labs done. Patient states that she was advised to stop taking her fluid pill and now her legs are extremely swollen. Patient had a blister about 3-4 inches wide on the the back of her lower right leg which has opened and is oozing with clear fluid. Betadine along with Tagaderm applied to the leg. Patient advised to start back on her lasix and come back for an appointment in three days for an office visit with provider.

## 2018-04-29 NOTE — Progress Notes (Signed)
Assessment and Plan:  Jocelyn Camacho was seen today for follow-up.  Diagnoses and all orders for this visit:  Venous stasis ulcer of right ankle limited to breakdown of skin without varicose veins (HCC)/ Diabetic skin ulcer ([place]) Wound care instructions provided;  Clean wound daily by removing old dressing, irrigate generously with water, wipe excess discharge off with gauze, apply slurry of sugar and betadine followed by non-adherent pad, absorbant dressing such as gauze or abd, then wrap to secure.  Follow up in 2 weeks unless concerning signs such as worsening wound, fever/chills Continue with lasix as currently taking; instructed to elevate extremities above level of heart at least every 2 hours and as much as possible to minimize swelling Recheck renal function at follow up -     doxycycline (VIBRA-TABS) 100 MG tablet; Take 1 tablet (100 mg total) by mouth 2 (two) times daily for 14 days.  Further disposition pending results of labs. Discussed med's effects and SE's.   Over 30 minutes of exam, counseling, chart review, and critical decision making was performed.   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   Her venous stasis edema was previously treated by multiple diuretics -lasix 40 mg, and metalozone 5 mg - but these were stopped on 4/18  after there was an acute drop of GFR from previous 54 to 28. This was back to baseline at time of lab recheck on 5/7 to 53 and she was instructed to restart on lasix 40 mg BID which she has done.     Past Medical History:  Diagnosis Date  . Allergic rhinitis   . Allergy   . Anxiety    with medical procedures  . Hyperlipidemia   . Hypertension   . Obesity   . OSA (obstructive sleep apnea)    wears CPAP  . Type II or unspecified type diabetes mellitus with renal manifestations, not stated as uncontrolled(250.40)   . Unspecified hypothyroidism   . Vitamin D deficiency      Allergies  Allergen Reactions  . Penicillins Swelling  . Sulfa Antibiotics Itching and Rash    Current Outpatient Medications on File Prior to Visit  Medication Sig  . allopurinol (ZYLOPRIM) 300 MG tablet take 1 tablet by mouth once daily  . aspirin EC 81 MG tablet Take 81 mg by mouth 2 (two) times daily.  Marland Kitchen atenolol (TENORMIN) 100 MG tablet TAKE ONE TABLET BY MOUTH ONCE DAILY (Patient taking differently: TAKE 100 mg BY MOUTH ONCE DAILY)  . BESIVANCE 0.6 % SUSP Place 1 drop into the left eye 4 (four) times daily.   . Blood Glucose Monitoring Suppl (ONE TOUCH ULTRA 2) w/Device KIT check blood sugar 3 times daily-DX-E10.9  . Cholecalciferol (VITAMIN D3) 5000 units CAPS Take 10,000 Units by mouth daily.  . colchicine 0.6 MG tablet Take 1 tablet (0.6 mg total)  by mouth daily.  . enalapril (VASOTEC) 20 MG tablet TAKE ONE TABLET BY MOUTH ONCE DAILY FOR BLOOD PRESSURE  . ferrous fumarate (HEMOCYTE - 106 MG FE) 325 (106 Fe) MG TABS tablet Take 325 mg of iron by mouth daily.   . furosemide (LASIX) 40 MG tablet TAKE ONE TABLET BY MOUTH TWICE DAILY FOR BLOOD PRESSURE AND  FLUID  . glucose blood (ONE TOUCH ULTRA TEST) test strip Check blood sugar 3 times daily-DX-E11.9 and Z79.4  . hyoscyamine (LEVSIN, ANASPAZ) 0.125 MG tablet Take 1 tablet (0.125 mg total) by mouth every 6 (six) hours as needed.  . insulin NPH-regular  Human (NOVOLIN 70/30) (70-30) 100 UNIT/ML injection Inject into the skin. Patient injects 25 units in the AM and 15 units in the PM.  . Lancets (ONETOUCH ULTRASOFT) lancets Use as instructed Dx. Code: E11.22  . Lancets (ONETOUCH ULTRASOFT) lancets Check blood sugar 3 times daily-DX-E10.9  . levothyroxine (SYNTHROID) 100 MCG tablet Take 1 tablet 1st in the morning on an empty stomach with only water for 30 minutes  . metFORMIN (GLUCOPHAGE XR) 500 MG 24 hr tablet Take 2 tablets 2 x/ day with meals for DIABETES  . OVER THE COUNTER MEDICATION Take 1 tablet by mouth daily. Tumeric with Black Pepper  . potassium chloride (K-DUR) 10 MEQ tablet take 1 tablet by mouth once daily (Patient taking differently: Take 10 meq by mouth once daily)  . prednisoLONE acetate (PRED FORTE) 1 % ophthalmic suspension INSTILL 1 DROP INTO EACH EYE TWICE DAILY  . diazepam (VALIUM) 5 MG tablet TAKE ONE TABLET BY MOUTH AT BEDTIME AS NEEDED FOR ANXIETY (Patient not taking: Reported on 04/30/2018)  . metolazone (ZAROXOLYN) 5 MG tablet Takle 1 tablet daily with Lasix for Fluid Retention % swelling (Patient not taking: Reported on 04/30/2018)   No current facility-administered medications on file prior to visit.     ROS: all negative except above.   Physical Exam:  BP 110/66   Pulse 84   Temp 97.9 F (36.6 C)   Ht _0  (1.6 m)   Wt 247 lb (112 kg)   SpO2 96%   BMI 43.75 kg/m   General Appearance: Well nourished, in no apparent distress. Eyes: PERRLA, EOMs, conjunctiva no swelling or erythema Respiratory: Respiratory effort normal, BS equal bilaterally without rales, rhonchi, wheezing or stridor.  Cardio: RRR with no MRGs. 1+ symmetrical pulses with nonpitting edema to bilateral ankles Lymphatics: Non tender without lymphadenopathy.  Musculoskeletal: normal gait.   Skin: Warm, dry; wound x 2 weeks on her right posterior ankle measuring approx 7 cm long by 4 cm high, limited to superficial breakdown with granulation  tissue overlayed with seropurulent discharge. Neuro: Sensation intact.  Psych: Awake and oriented X 3, normal affect, Insight and Judgment appropriate.     Izora Ribas, NP 10:19 AM [facility]

## 2018-04-30 ENCOUNTER — Ambulatory Visit (INDEPENDENT_AMBULATORY_CARE_PROVIDER_SITE_OTHER): Payer: Medicare Other | Admitting: Adult Health

## 2018-04-30 ENCOUNTER — Encounter: Payer: Self-pay | Admitting: Adult Health

## 2018-04-30 VITALS — BP 110/66 | HR 84 | Temp 97.9°F | Ht 63.0 in | Wt 247.0 lb

## 2018-04-30 DIAGNOSIS — I872 Venous insufficiency (chronic) (peripheral): Secondary | ICD-10-CM | POA: Insufficient documentation

## 2018-04-30 DIAGNOSIS — L98499 Non-pressure chronic ulcer of skin of other sites with unspecified severity: Secondary | ICD-10-CM

## 2018-04-30 DIAGNOSIS — L97311 Non-pressure chronic ulcer of right ankle limited to breakdown of skin: Secondary | ICD-10-CM | POA: Diagnosis not present

## 2018-04-30 DIAGNOSIS — E11622 Type 2 diabetes mellitus with other skin ulcer: Secondary | ICD-10-CM | POA: Diagnosis not present

## 2018-04-30 MED ORDER — DOXYCYCLINE HYCLATE 100 MG PO TABS: 100.0000 mg | ORAL_TABLET | Freq: Two times a day (BID) | ORAL | 0 refills | Status: AC

## 2018-04-30 NOTE — Patient Instructions (Addendum)
Every day - take off old bandage, rinse off leg in shower, wipe off any excess thick yellow discharge until you see a clean wound, then apply betadine and sugar mixture (consistency of peanut butter), apply non-adherent pads, then some gauze to catch any excess leaking, then wrap with ace or tape to hold dressing in place.   Betadine will stain clothing and sheets - make sure to cover/protect any furniture or linens     Venous Ulcer A venous ulcer is a shallow sore on your lower leg that is caused by poor circulation in your veins. This condition used to be called stasis ulcer. Veins have valves that help return blood to the heart. If these valves do not work properly, it can cause blood to flow backward and to back up into the veins near the skin. When that happens, blood can pool in your lower legs. The blood can then leak out of your veins, which can irritate your skin. This may cause a break in your skin that becomes a venous ulcer. Venous ulcer is the most common type of lower leg ulcer. You may have venous ulcers on one leg or on both legs. The area where this condition most commonly develops is around the ankles. A venous ulcer may last for a long time (chronic ulcer) or it may return repeatedly (recurrent ulcer). What are the causes? Any condition that causes poor circulation to your legs can lead to a venous ulcer. What increases the risk? This condition is more likely to develop in:  People who are 84 years of age or older.  People who are overweight.  People who are not active.  People who have had a leg ulcer in the past.  People who have clots in their lower leg veins (deep vein thrombosis).  People who have inflammation of their leg veins (phlebitis).  Women who have given birth.  People who smoke.  What are the signs or symptoms? The most common symptom of this condition is an open sore near your ankle. Other symptoms may include:  Swelling.  Thickening of the  skin.  Fluid leaking from the ulcer.  Bleeding.  Itching.  Pain and swelling that gets worse when you stand up and feels better when you raise your leg.  Blotchy skin.  Darkening of the skin.  How is this diagnosed? Your health care provider may suspect a venous ulcer based on your medical history and your risk factors. Your health care provider will check the skin on your legs. Other tests may be done to learn more about the ulcer and to determine the best way to treat it. Tests that may be done include:  Measuring the blood pressure in your arms and legs.  Using sound waves (ultrasound) to measure the blood flow in your leg veins.  How is this treated? You may need to try several different types of treatment to get your venous ulcer to heal. Healing may take a long time. Treatment may include:  Keeping your leg raised (elevated).  Wearing a type of bandage or stocking to compress the veins of your leg (compression therapy). Venous wounds are not likely to heal or to stay healed without compression.  Taking medicines to improve blood flow.  Taking antibiotic medicines to treat infection.  Cleaning your ulcer and removing any dead tissue from the wound (debridement).  Placing various types of medicated bandage (dressings) or medicated wraps on your ulcer. This helps the ulcer to heal and helps to prevent  infection.  Surgery is sometimes needed to close the wound using a piece of skin taken from another area of your body (graft). You may need surgery if other treatments are not working or if your ulcer is very deep. Follow these instructions at home: Wound care  Follow instructions from your health care provider about: ? How to take care of your wound. ? When and how you should change your bandage (dressing). ? When you should remove your dressing. If your dressing is dry and sticks to your leg when you try to remove it, moisten or wet the dressing with saline solution or  water so that the dressing can be removed without harming your skin or wound tissue.  Check your wound every day for signs of infection. Have a caregiver do this for you if you are not able to do it yourself. Check for: ? More redness, swelling, or pain. ? More fluid or blood. ? Pus, warmth, or a bad smell. Medicines  Take over-the-counter and prescription medicines only as told by your health care provider.  If you were prescribed an antibiotic medicine, take it or apply it as told by your health care provider. Do not stop taking or using the antibiotic even if your condition improves. Activity  Do not stand or sit in one position for a long period of time. Rest with your legs raised during the day. If possible, keep your legs above your heart for 30 minutes, 3-4 times a day, or as told by your health care provider.  Do not sit with your legs crossed.  Walk often to increase the blood flow in your legs.Ask your health care provider what level of activity is safe for you.  If you are taking a long ride in a car or plane, take a break to walk around at least once every two hours, or as often as your health care provider recommends. Ask your health care provider if you should take aspirin before long trips. General instructions   Wear elastic stockings, compression stockings, or support hose as told by your health care provider. This is very important.  Raise the foot of your bed as told by your health care provider.  Do not smoke.  Keep all follow-up visits as told by your health care provider. This is important. Contact a health care provider if:  You have a fever.  Your ulcer is getting larger or is not healing.  Your pain gets worse.  You have more redness or swelling around your ulcer.  You have more fluid, blood, or pus coming from your ulcer after it has been cleaned by you or your health care provider.  You have warmth or a bad smell coming from your ulcer. This  information is not intended to replace advice given to you by your health care provider. Make sure you discuss any questions you have with your health care provider. Document Released: 09/02/2001 Document Revised: 05/15/2016 Document Reviewed: 04/18/2015 Elsevier Interactive Patient Education  [employer].

## 2018-05-01 ENCOUNTER — Other Ambulatory Visit: Payer: Self-pay | Admitting: Internal Medicine

## 2018-05-01 MED ORDER — POTASSIUM CHLORIDE ER 10 MEQ PO TBCR: EXTENDED_RELEASE_TABLET | ORAL | 3 refills | Status: DC

## 2018-05-04 ENCOUNTER — Encounter: Payer: Self-pay | Admitting: *Deleted

## 2018-05-10 ENCOUNTER — Encounter (INDEPENDENT_AMBULATORY_CARE_PROVIDER_SITE_OTHER): Payer: Medicare Other | Admitting: Ophthalmology

## 2018-05-10 DIAGNOSIS — H35033 Hypertensive retinopathy, bilateral: Secondary | ICD-10-CM

## 2018-05-10 DIAGNOSIS — E11319 Type 2 diabetes mellitus with unspecified diabetic retinopathy without macular edema: Secondary | ICD-10-CM

## 2018-05-10 DIAGNOSIS — H43813 Vitreous degeneration, bilateral: Secondary | ICD-10-CM

## 2018-05-10 DIAGNOSIS — H2511 Age-related nuclear cataract, right eye: Secondary | ICD-10-CM

## 2018-05-10 DIAGNOSIS — E113391 Type 2 diabetes mellitus with moderate nonproliferative diabetic retinopathy without macular edema, right eye: Secondary | ICD-10-CM

## 2018-05-10 DIAGNOSIS — H34812 Central retinal vein occlusion, left eye, with macular edema: Secondary | ICD-10-CM | POA: Diagnosis not present

## 2018-05-10 DIAGNOSIS — I10 Essential (primary) hypertension: Secondary | ICD-10-CM | POA: Diagnosis not present

## 2018-05-11 NOTE — Progress Notes (Signed)
Patient ID: Jocelyn Camacho, female   DOB: [birth year], 72 y.o.   MRN: [id number]

## 2018-05-11 NOTE — Progress Notes (Signed)
Subjective:    Patient ID: Jocelyn Camacho, female    DOB: 1946-02-23, 72 y.o.   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  Last GFR was up from 24 (CKD4)  to 46 (CKD3). Last OV (04/30/2018) she was begun on Betadine / Sugar Compound to a Rt posterior calf  ulcer - 7 cm x 4 cm .     Back on lasix for the last 2 weeks and her weight is down 2+ # from 247# to current 244.8#  Medication Sig  . allopurinol (ZYLOPRIM) 300 MG tablet take 1 tablet by mouth once daily  . aspirin EC 81 MG tablet Take 81 mg by mouth 2 (two) times daily.  Marland Kitchen atenolol (TENORMIN) 100 MG tablet TAKE ONE TABLET BY MOUTH ONCE DAILY (Patient taking differently: TAKE 100 mg BY MOUTH ONCE DAILY)  . BESIVANCE 0.6 % SUSP Place 1 drop into the left eye 4 (four) times daily.   . Blood Glucose Monitoring Suppl (ONE TOUCH ULTRA 2) w/Device KIT check blood sugar 3 times daily-DX-E10.9  . Cholecalciferol (VITAMIN D3) 5000 units CAPS Take 10,000 Units by mouth daily.  . colchicine 0.6 MG tablet Take 1 tablet (0.6 mg total) by mouth daily.  . diazepam (VALIUM) 5 MG tablet TAKE ONE TABLET BY MOUTH AT BEDTIME AS NEEDED FOR ANXIETY (Patient not taking: Reported on 04/30/2018)  . doxycycline (VIBRA-TABS) 100 MG tablet Take 1 tablet (100 mg total) by mouth 2 (two) times daily for 14 days.  . enalapril (VASOTEC) 20 MG tablet TAKE ONE TABLET BY MOUTH ONCE DAILY FOR BLOOD PRESSURE  . ferrous fumarate (HEMOCYTE - 106 MG FE) 325 (106 Fe) MG TABS tablet Take 325 mg of iron by mouth daily.   . furosemide (LASIX) 40 MG tablet TAKE ONE TABLET BY MOUTH TWICE DAILY FOR BLOOD PRESSURE AND  FLUID  . glucose blood (ONE TOUCH ULTRA TEST) test strip Check blood sugar 3 times daily-DX-E11.9 and Z79.4  . hyoscyamine (LEVSIN, ANASPAZ) 0.125 MG tablet Take 1 tablet (0.125 mg total)  by mouth every 6 (six) hours as needed.  . insulin NPH-regular Human (NOVOLIN 70/30) (70-30) 100 UNIT/ML injection Inject into the skin. Patient injects 25 units in the AM and 15 units in the PM.  . Lancets (ONETOUCH ULTRASOFT) lancets Use as instructed Dx. Code: E11.22  . Lancets (ONETOUCH ULTRASOFT) lancets Check blood sugar 3 times daily-DX-E10.9  . levothyroxine (SYNTHROID) 100 MCG tablet Take 1 tablet 1st in the morning on an empty stomach with only water for 30 minutes  . metFORMIN (GLUCOPHAGE XR) 500 MG 24 hr tablet Take 2 tablets 2 x/ day with meals for DIABETES  . metolazone (ZAROXOLYN) 5 MG tablet Takle 1 tablet daily with Lasix for Fluid Retention % swelling (Patient not taking: Reported on 04/30/2018)  . OVER THE COUNTER MEDICATION Take 1 tablet by mouth daily. Tumeric with Black Pepper  . potassium chloride (K-DUR) 10 MEQ tablet Take 1 tablet daily  . prednisoLONE acetate (PRED FORTE) 1 % ophthalmic suspension INSTILL 1 DROP INTO EACH EYE TWICE DAILY   Allergies  Allergen Reactions  . Penicillins Swelling  . Sulfa Antibiotics Itching and Rash   Past Medical History:  Diagnosis Date  . Allergic rhinitis   . Allergy   . Anxiety    with medical procedures  . Hyperlipidemia   .  Hypertension   . Obesity   . OSA (obstructive sleep apnea)    wears CPAP  . Type II or unspecified type diabetes mellitus with renal manifestations, not stated as uncontrolled(250.40)   . Unspecified hypothyroidism   . Vitamin D deficiency    Past Surgical History:  Procedure Laterality Date  .  eye procedure     have treatment where a needle is stuck into left eye- since June 2014; now every six weeks  . DENTAL EXAMINATION UNDER ANESTHESIA    . TUBAL LIGATION     Review of Systems  10 point systems review negative except as above.    Objective:   Physical Exam  BP 128/64   Pulse 84   Temp (!) 97.2 F (36.2 C)   Ht '5\' 3"'$  (1.6 m)   Wt 244 lb 12.8 oz (111 kg)   BMI 43.36 kg/m   HEENT  - WNL. Neck - supple.  Chest - Clear equal BS. Cor - Nl HS. RRR w/o sig MGR. PP 1(+).  Still 2 + pretibial edema bilat.  edema. MS- FROM w/o deformities.  Gait Nl. Neuro -  Nl w/o focal abnormalities. Skin - superficial skin breakdown of the posterior R calf appears clean & dry w/o obvious infection.    Assessment & Plan:   1. Venous stasis ulcer of right ankle limited to breakdown of skin without varicose veins (HCC)  - CBC with Differential/Platelet  2. Renal insufficiency  - COMPLETE METABOLIC PANEL WITH GFR  3. Essential hypertension  - CBC with Differential/Platelet - COMPLETE METABOLIC PANEL WITH GFR  4. Medication management  - CBC with Differential/Platelet - COMPLETE METABOLIC PANEL WITH GFR     _ Ok'd to finish Abx's today & continue lasix bid pending today's labs. Further recommend ROV 2 weeks for close monitoring of her skin breakdown and renal functions.

## 2018-05-12 ENCOUNTER — Ambulatory Visit (INDEPENDENT_AMBULATORY_CARE_PROVIDER_SITE_OTHER): Payer: Medicare Other | Admitting: Internal Medicine

## 2018-05-12 ENCOUNTER — Encounter: Payer: Self-pay | Admitting: Internal Medicine

## 2018-05-12 VITALS — BP 128/64 | HR 84 | Temp 97.2°F | Ht 63.0 in | Wt 244.8 lb

## 2018-05-12 DIAGNOSIS — N289 Disorder of kidney and ureter, unspecified: Secondary | ICD-10-CM | POA: Diagnosis not present

## 2018-05-12 DIAGNOSIS — Z79899 Other long term (current) drug therapy: Secondary | ICD-10-CM

## 2018-05-12 DIAGNOSIS — I872 Venous insufficiency (chronic) (peripheral): Secondary | ICD-10-CM | POA: Diagnosis not present

## 2018-05-12 DIAGNOSIS — I10 Essential (primary) hypertension: Secondary | ICD-10-CM

## 2018-05-12 DIAGNOSIS — L97311 Non-pressure chronic ulcer of right ankle limited to breakdown of skin: Secondary | ICD-10-CM | POA: Diagnosis not present

## 2018-05-12 LAB — CBC WITH DIFFERENTIAL/PLATELET
BASOS PCT: 0.9 %
Basophils Absolute: 108 cells/uL (ref 0–200)
EOS ABS: 156 {cells}/uL (ref 15–500)
Eosinophils Relative: 1.3 %
HCT: 31.6 % — ABNORMAL LOW (ref 35.0–45.0)
Hemoglobin: 10.5 g/dL — ABNORMAL LOW (ref 11.7–15.5)
Lymphs Abs: 2928 cells/uL (ref 850–3900)
MCH: 27.9 pg (ref 27.0–33.0)
MCHC: 33.2 g/dL (ref 32.0–36.0)
MCV: 83.8 fL (ref 80.0–100.0)
MPV: 11.5 fL (ref 7.5–12.5)
Monocytes Relative: 6.4 %
NEUTROS PCT: 67 %
Neutro Abs: 8040 cells/uL — ABNORMAL HIGH (ref 1500–7800)
PLATELETS: 448 10*3/uL — AB (ref 140–400)
RBC: 3.77 10*6/uL — ABNORMAL LOW (ref 3.80–5.10)
RDW: 15.9 % — AB (ref 11.0–15.0)
TOTAL LYMPHOCYTE: 24.4 %
WBC: 12 10*3/uL — ABNORMAL HIGH (ref 3.8–10.8)
WBCMIX: 768 {cells}/uL (ref 200–950)

## 2018-05-12 LAB — COMPLETE METABOLIC PANEL WITH GFR
AG RATIO: 1.3 (calc) (ref 1.0–2.5)
ALT: 9 U/L (ref 6–29)
AST: 14 U/L (ref 10–35)
Albumin: 4 g/dL (ref 3.6–5.1)
Alkaline phosphatase (APISO): 86 U/L (ref 33–130)
BUN/Creatinine Ratio: 30 (calc) — ABNORMAL HIGH (ref 6–22)
BUN: 51 mg/dL — ABNORMAL HIGH (ref 7–25)
CO2: 30 mmol/L (ref 20–32)
Calcium: 10.1 mg/dL (ref 8.6–10.4)
Chloride: 100 mmol/L (ref 98–110)
Creat: 1.72 mg/dL — ABNORMAL HIGH (ref 0.60–0.93)
GFR, EST AFRICAN AMERICAN: 34 mL/min/{1.73_m2} — AB (ref >=60)
GFR, Est Non African American: 29 mL/min/{1.73_m2} — ABNORMAL LOW (ref >=60)
Globulin: 3 g/dL (calc) (ref 1.9–3.7)
Glucose, Bld: 114 mg/dL — ABNORMAL HIGH (ref 65–99)
POTASSIUM: 5.1 mmol/L (ref 3.5–5.3)
SODIUM: 139 mmol/L (ref 135–146)
TOTAL PROTEIN: 7 g/dL (ref 6.1–8.1)
Total Bilirubin: 0.5 mg/dL (ref 0.2–1.2)

## 2018-05-13 ENCOUNTER — Other Ambulatory Visit: Payer: Self-pay | Admitting: Internal Medicine

## 2018-05-22 ENCOUNTER — Other Ambulatory Visit: Payer: Self-pay | Admitting: Physician Assistant

## 2018-05-26 DIAGNOSIS — N183 Chronic kidney disease, stage 3 (moderate): Secondary | ICD-10-CM

## 2018-05-26 DIAGNOSIS — E1122 Type 2 diabetes mellitus with diabetic chronic kidney disease: Secondary | ICD-10-CM | POA: Insufficient documentation

## 2018-05-26 NOTE — Progress Notes (Signed)
Assessment and Plan:  1. Venous stasis ulcer of right ankle limited to breakdown of skin without varicose veins ([place]) Healing well; very small ulcer remains to her ankle <1 cm with faint injection of surrounding area Continue applying betadine/sugar slurry to to open wound Emphasized extremity elevation to manage edema- will maximize diuretic as tolerated by kidneys Follow up as scheduled later this month  2. CKD stage 4 due to type 2 diabetes mellitus (HCC) Increase fluids, avoid NSAIDS, monitor sugars, will monitor BMP/GFR  Further disposition pending results of labs. Discussed med's effects and SE's.   Over 15 minutes of exam, counseling, chart review, and critical decision making was performed.   Future Appointments  Date Time Provider [place]  06/14/2018  9:00 AM [name], NP [facility] None  06/21/2018  8:30 AM [name], MD [facility] None    ------------------------------------------------------------------------------------------------------------------   HPI 72 y.o. AA female with morbid obesity, insulin requiring T2DM, CKD 3/4 and dependent venous insufficiency edema presents for monitoring of venous stasis ulcer monitoring since 04/30/2018 (7 cm x 4 cm). Last GFR was down from 46 (5/7) to 29 (5/22), she was instructed to push fluids. She has been instructed to apply betadine/sugar compound to R posterior calf wound.   She presents today with venous stasis ulcer much improved, now <1 cm area to posterior right ankle. She has stopped applying betadine/sugar paste, but continues to elevate extremities throughout day and at night.   BMI is Body mass index is 44.11 kg/m. She is currently on lasix 80 mg BID; not currently taking metolazone due to recent renal functions.  Wt Readings from Last 3 Encounters:  05/27/18 249 lb (112.9 kg)  05/12/18 244 lb 12.8 oz (111 kg)  04/30/18 247 lb (112 kg)    Past Medical History:  Diagnosis Date  . Allergic rhinitis    . Allergy   . Anxiety    with medical procedures  . Hyperlipidemia   . Hypertension   . Obesity   . OSA (obstructive sleep apnea)    wears CPAP  . Type II or unspecified type diabetes mellitus with renal manifestations, not stated as uncontrolled(250.40)   . Unspecified hypothyroidism   . Vitamin D deficiency      Allergies  Allergen Reactions  . Penicillins Swelling  . Sulfa Antibiotics Itching and Rash    Current Outpatient Medications on File Prior to Visit  Medication Sig  . allopurinol (ZYLOPRIM) 300 MG tablet take 1 tablet by mouth once daily  . aspirin EC 81 MG tablet Take 81 mg by mouth 2 (two) times daily.  Marland Kitchen atenolol (TENORMIN) 100 MG tablet TAKE ONE TABLET BY MOUTH ONCE DAILY  . BESIVANCE 0.6 % SUSP Place 1 drop into the left eye 4 (four) times daily.   . Blood Glucose Monitoring Suppl (ONE TOUCH ULTRA 2) w/Device KIT check blood sugar 3 times daily-DX-E10.9  . Cholecalciferol (VITAMIN D3) 5000 units CAPS Take 10,000 Units by mouth daily.  . colchicine 0.6 MG tablet Take 1 tablet (0.6 mg total) by mouth daily.  . diazepam (VALIUM) 5 MG tablet TAKE ONE TABLET BY MOUTH AT BEDTIME AS NEEDED FOR ANXIETY  . enalapril (VASOTEC) 20 MG tablet TAKE ONE TABLET BY MOUTH ONCE DAILY FOR BLOOD PRESSURE  . ferrous fumarate (HEMOCYTE - 106 MG FE) 325 (106 Fe) MG TABS tablet Take 325 mg of iron by mouth daily.   . furosemide (LASIX) 40 MG tablet TAKE 1 TABLET BY MOUTH TWICE DAILY FOR BLOOD PRESSURE AND  FOR FLUID  . glucose blood (ONE TOUCH ULTRA TEST) test strip Check blood sugar 3 times daily-DX-E11.9 and Z79.4  . hyoscyamine (LEVSIN, ANASPAZ) 0.125 MG tablet Take 1 tablet (0.125 mg total) by mouth every 6 (six) hours as needed.  . insulin NPH-regular Human (NOVOLIN 70/30) (70-30) 100 UNIT/ML injection Inject into the skin. Patient injects 25 units in the AM and 15 units in the PM.  . Lancets (ONETOUCH ULTRASOFT) lancets Use as instructed Dx. Code: E11.22  . Lancets (ONETOUCH  ULTRASOFT) lancets Check blood sugar 3 times daily-DX-E10.9  . levothyroxine (SYNTHROID) 100 MCG tablet Take 1 tablet 1st in the morning on an empty stomach with only water for 30 minutes  . metFORMIN (GLUCOPHAGE XR) 500 MG 24 hr tablet Take 2 tablets 2 x/ day with meals for DIABETES  . metolazone (ZAROXOLYN) 5 MG tablet Takle 1 tablet daily with Lasix for Fluid Retention % swelling  . OVER THE COUNTER MEDICATION Take 1 tablet by mouth daily. Tumeric with Black Pepper  . potassium chloride (K-DUR) 10 MEQ tablet Take 1 tablet daily  . prednisoLONE acetate (PRED FORTE) 1 % ophthalmic suspension INSTILL 1 DROP INTO EACH EYE TWICE DAILY   No current facility-administered medications on file prior to visit.     ROS: Review of Systems  Constitutional: Negative for chills, diaphoresis, fever, malaise/fatigue and weight loss.  Respiratory: Negative for cough, sputum production, shortness of breath and wheezing.   Cardiovascular: Negative for chest pain, palpitations, orthopnea, claudication, leg swelling and PND.  Gastrointestinal: Negative for nausea and vomiting.  Musculoskeletal: Negative for joint pain and myalgias.     Physical Exam:  BP 130/76   Pulse 86   Temp (!) 97.3 F (36.3 C)   Ht _0  (1.6 m)   Wt 249 lb (112.9 kg)   SpO2 98%   BMI 44.11 kg/m   General Appearance: Well nourished, in no apparent distress. Neck: Supple.  Respiratory: Respiratory effort normal, BS equal bilaterally without rales, rhonchi, wheezing or stridor.  Cardio: RRR with no MRGs. 1+ symmetrical peripheral pulses with non-pitting edema.  Abdomen: Soft, + BS.  Non tender, no guarding, rebound, hernias, masses. Lymphatics: Non tender without lymphadenopathy.  Musculoskeletal: Symmetrical strength, normal gait.  Skin: Warm, dry; right posterior ankle wound much improved, ulcer now <1 cm, surrounding area faintly injected but not notably warm, scant serous discharge from open wound Neuro: Sensation intact.   Psych: Awake and oriented X 3, normal affect, Insight and Judgment appropriate.     Izora Ribas, NP 10:47 AM [facility]

## 2018-05-27 ENCOUNTER — Ambulatory Visit (INDEPENDENT_AMBULATORY_CARE_PROVIDER_SITE_OTHER): Payer: Medicare Other | Admitting: Adult Health

## 2018-05-27 ENCOUNTER — Encounter: Payer: Self-pay | Admitting: Adult Health

## 2018-05-27 VITALS — BP 130/76 | HR 86 | Temp 97.3°F | Ht 63.0 in | Wt 249.0 lb

## 2018-05-27 DIAGNOSIS — N184 Chronic kidney disease, stage 4 (severe): Secondary | ICD-10-CM

## 2018-05-27 DIAGNOSIS — I872 Venous insufficiency (chronic) (peripheral): Secondary | ICD-10-CM

## 2018-05-27 DIAGNOSIS — D649 Anemia, unspecified: Secondary | ICD-10-CM | POA: Diagnosis not present

## 2018-05-27 DIAGNOSIS — E1122 Type 2 diabetes mellitus with diabetic chronic kidney disease: Secondary | ICD-10-CM | POA: Diagnosis not present

## 2018-05-27 DIAGNOSIS — L97311 Non-pressure chronic ulcer of right ankle limited to breakdown of skin: Secondary | ICD-10-CM | POA: Diagnosis not present

## 2018-05-28 DIAGNOSIS — D638 Anemia in other chronic diseases classified elsewhere: Secondary | ICD-10-CM | POA: Insufficient documentation

## 2018-05-28 DIAGNOSIS — D649 Anemia, unspecified: Secondary | ICD-10-CM | POA: Insufficient documentation

## 2018-05-28 LAB — CBC WITH DIFFERENTIAL/PLATELET
Basophils Absolute: 78 cells/uL (ref 0–200)
Basophils Relative: 0.8 %
EOS PCT: 2.4 %
Eosinophils Absolute: 235 cells/uL (ref 15–500)
HCT: 30.5 % — ABNORMAL LOW (ref 35.0–45.0)
Hemoglobin: 10 g/dL — ABNORMAL LOW (ref 11.7–15.5)
Lymphs Abs: 2234 cells/uL (ref 850–3900)
MCH: 27.9 pg (ref 27.0–33.0)
MCHC: 32.8 g/dL (ref 32.0–36.0)
MCV: 85 fL (ref 80.0–100.0)
MPV: 12.2 fL (ref 7.5–12.5)
Monocytes Relative: 6.5 %
NEUTROS PCT: 67.5 %
Neutro Abs: 6615 cells/uL (ref 1500–7800)
PLATELETS: 359 10*3/uL (ref 140–400)
RBC: 3.59 10*6/uL — AB (ref 3.80–5.10)
RDW: 16.7 % — AB (ref 11.0–15.0)
TOTAL LYMPHOCYTE: 22.8 %
WBC mixed population: 637 cells/uL (ref 200–950)
WBC: 9.8 10*3/uL (ref 3.8–10.8)

## 2018-05-28 LAB — BASIC METABOLIC PANEL WITH GFR
BUN/Creatinine Ratio: 19 (calc) (ref 6–22)
BUN: 24 mg/dL (ref 7–25)
CHLORIDE: 102 mmol/L (ref 98–110)
CO2: 29 mmol/L (ref 20–32)
CREATININE: 1.24 mg/dL — AB (ref 0.60–0.93)
Calcium: 9.5 mg/dL (ref 8.6–10.4)
GFR, Est African American: 50 mL/min/{1.73_m2} — ABNORMAL LOW (ref >=60)
GFR, Est Non African American: 43 mL/min/{1.73_m2} — ABNORMAL LOW (ref >=60)
Glucose, Bld: 164 mg/dL — ABNORMAL HIGH (ref 65–99)
Potassium: 4.5 mmol/L (ref 3.5–5.3)
SODIUM: 141 mmol/L (ref 135–146)

## 2018-05-28 LAB — IRON,TIBC AND FERRITIN PANEL
%SAT: 20 % (ref 11–50)
Ferritin: 251 ng/mL (ref 20–288)
Iron: 60 ug/dL (ref 45–160)
TIBC: 295 mcg/dL (calc) (ref 250–450)

## 2018-05-28 LAB — TEST AUTHORIZATION

## 2018-06-10 DIAGNOSIS — E11319 Type 2 diabetes mellitus with unspecified diabetic retinopathy without macular edema: Secondary | ICD-10-CM | POA: Insufficient documentation

## 2018-06-10 NOTE — Progress Notes (Signed)
Complete Physical  Assessment and Plan:  Diagnoses and all orders for this visit:  Encounter for routine adult health examination with abnormal findings  Essential hypertension Continue medication Monitor blood pressure at home; call if consistently over 130/80 Continue DASH diet.   Reminder to go to the ER if any CP, SOB, nausea, dizziness, severe HA, changes vision/speech, left arm numbness and tingling and jaw pain.  OSA (obstructive sleep apnea) Wears CPAP inconsistently, emphasized adherence  Hyperlipidemia associated with type 2 diabetes ([place]) Currently statin deferred due to fair control off of medications Continue low cholesterol diet and exercise.  Check lipid panel.   Hypothyroidism, unspecified type continue medications the same pending lab results reminded to take on an empty stomach 30-4mns before food.  check TSH level  Type 2 diabetes mellitus with stage 2 chronic kidney disease, without long-term current use of insulin ([facility] Education: Reviewed 'ABCs' of diabetes management (respective goals in parentheses):  A1C (<7), blood pressure (<130/80), and cholesterol (LDL <70) Eye Exam yearly and Dental Exam every 6 months. Dietary recommendations Physical Activity recommendations Foot exam completed   Diabetic retinopathy Control sugars, followed by ophthalmology  CKD stage 3 due to type 2 diabetes mellitus (HCC) Increase fluids, avoid NSAIDS, monitor sugars, will monitor  Venous stasis ulcer of right ankle limited to breakdown of skin without varicose veins (HCC) Resolved  Vitamin D deficiency Continue supplementation Check vitamin D level  Obesity, morbid ([place] Long discussion about weight loss, diet, and exercise Recommended diet heavy in fruits and veggies and low in animal meats, cheeses, and dairy products, appropriate calorie intake Patient will work on restart [employer] silver sneakers (water aerobics) Discussed appropriate weight for  height and initial goal (240 lb) Follow up at next visit  Macular degeneration, unspecified laterality, unspecified type Followed by ophthalmology  Hyperlipidemia Currently not treated secondary to fair control without medications Continue low cholesterol diet and exercise.  Check lipid panel.   Chronic gout without tophus, unspecified cause, unspecified site Continue allopurinol Diet discussed Check uric acid   Generalized anxiety disorder Recently well controlled; uses benzo PRN prior to eye injections Stress management techniques discussed, increase water, good sleep hygiene discussed, increase exercise, and increase veggies.   Anemia, unspecified type On iron with recent normal iron studies Check B12, folate   Murmur No recent echo, ? New murmur that has progressed since last visit, 4/6 will order ECHO to evaluate.   Discussed med's effects and SE's. Screening labs and tests as requested with regular follow-up as recommended. Over 40 minutes of exam, counseling, chart review, and complex, high level critical decision making was performed this visit.   Future Appointments  Date Time Provider [place] 06/21/2018  8:30 AM MHayden Pedro MD [facility] None  08/04/2019  9:00 AM CLiane Comber NP [facility] None     HPI  72y.o. female  presents for a complete physical and follow up for has Essential hypertension; DM (diabetes mellitus), type 2 with renal complications ([place]; Vitamin D deficiency; Obesity, morbid ([place]; OSA (obstructive sleep apnea); Hypothyroidism; Medication management; Macular degeneration; Generalized anxiety disorder; Encounter for Medicare annual wellness exam; Gout; Gluttony; Hyperlipidemia associated with type 2 diabetes mellitus ([place]; Venous stasis ulcer of right ankle limited to breakdown of skin without varicose veins ([place]; CKD stage 3 due to type 2 diabetes mellitus ([place]; Anemia; and Diabetic retinopathy associated with type 2 diabetes  mellitus ([place] on their problem list.   she has a diagnosis of anxiety and is currently on  valium 5 mg once daily PRN, reports symptoms are well controlled on current regimen. she takes the night before she goes to get eye injections, hasn't needed in several months  BMI is Body mass index is 44.88 kg/m., she has not been working on diet, limited by R knee arthritis. She does have silver sneakers but hasn't been using. She reports she has been successful with 90# weight loss on weight watchers and is thinking about restarting. Wt Readings from Last 3 Encounters:  06/14/18 245 lb 6.4 oz (111.3 kg)  05/27/18 249 lb (112.9 kg)  05/12/18 244 lb 12.8 oz (111 kg)   Her blood pressure has been controlled at home, today their BP is BP: 130/80 She does not workout. She denies chest pain, shortness of breath, dizziness.   She is not on cholesterol medication and denies myalgias. Her cholesterol is not at goal. The cholesterol last visit was:   Lab Results  Component Value Date   CHOL 144 03/12/2018   HDL 34 (L) 03/12/2018   LDLCALC 89 03/12/2018   TRIG 118 03/12/2018   CHOLHDL 4.2 03/12/2018   She has not been working on diet and exercise for T2DM (on metformin 4 tabs daily, novolin 70/30 - 25 units AM, 10 units PM), she is on bASA, she is on ACE/ARB and denies foot ulcerations, hyperglycemia, hypoglycemia , increased appetite, nausea, paresthesia of the feet, polydipsia, polyuria, visual disturbances, vomiting and weight loss. Last A1C in the office was:  Lab Results  Component Value Date   HGBA1C 6.3 (H) 03/12/2018   She is on thyroid medication. Her medication was not changed last visit.   Lab Results  Component Value Date   TSH 0.51 03/30/2018   Last GFR: Lab Results  Component Value Date   GFRAA 50 (L) 05/27/2018   Patient is on allopurinol for gout and does not report a recent flare.  Lab Results  Component Value Date   LABURIC 6.6 03/30/2018   Patient is on Vitamin D  supplement.   Lab Results  Component Value Date   VD25OH 53 03/12/2018      Current Medications:  Current Outpatient Medications on File Prior to Visit  Medication Sig Dispense Refill  . allopurinol (ZYLOPRIM) 300 MG tablet take 1 tablet by mouth once daily 90 tablet 1  . aspirin EC 81 MG tablet Take 81 mg by mouth 2 (two) times daily.    Marland Kitchen atenolol (TENORMIN) 100 MG tablet TAKE ONE TABLET BY MOUTH ONCE DAILY (Patient taking differently: TAKE ONE/HALF TABLET BY MOUTH ONCE DAILY) 90 tablet 1  . BESIVANCE 0.6 % SUSP Place 1 drop into the left eye 4 (four) times daily.     . Blood Glucose Monitoring Suppl (ONE TOUCH ULTRA 2) w/Device KIT check blood sugar 3 times daily-DX-E10.9 1 each 0  . Cholecalciferol (VITAMIN D3) 5000 units CAPS Take 10,000 Units by mouth daily.    . colchicine 0.6 MG tablet Take 1 tablet (0.6 mg total) by mouth daily. 30 tablet 2  . diazepam (VALIUM) 5 MG tablet TAKE ONE TABLET BY MOUTH AT BEDTIME AS NEEDED FOR ANXIETY 30 tablet 1  . enalapril (VASOTEC) 20 MG tablet TAKE ONE TABLET BY MOUTH ONCE DAILY FOR BLOOD PRESSURE 90 tablet 1  . ferrous fumarate (HEMOCYTE - 106 MG FE) 325 (106 Fe) MG TABS tablet Take 325 mg of iron by mouth daily.     . furosemide (LASIX) 40 MG tablet TAKE 1 TABLET BY MOUTH TWICE DAILY FOR BLOOD PRESSURE  AND FOR FLUID 180 tablet 1  . glucose blood (ONE TOUCH ULTRA TEST) test strip Check blood sugar 3 times daily-DX-E11.9 and Z79.4 100 each 12  . hyoscyamine (LEVSIN, ANASPAZ) 0.125 MG tablet Take 1 tablet (0.125 mg total) by mouth every 6 (six) hours as needed. 60 tablet 1  . insulin NPH-regular Human (NOVOLIN 70/30) (70-30) 100 UNIT/ML injection Inject into the skin. Patient injects 25 units in the AM and 15 units in the PM.    . Lancets (ONETOUCH ULTRASOFT) lancets Use as instructed Dx. Code: E11.22 100 each 12  . Lancets (ONETOUCH ULTRASOFT) lancets Check blood sugar 3 times daily-DX-E10.9 300 each 1  . levothyroxine (SYNTHROID) 100 MCG tablet  Take 1 tablet 1st in the morning on an empty stomach with only water for 30 minutes 90 tablet 1  . metFORMIN (GLUCOPHAGE XR) 500 MG 24 hr tablet Take 2 tablets 2 x/ day with meals for DIABETES 360 tablet 1  . metolazone (ZAROXOLYN) 5 MG tablet Takle 1 tablet daily with Lasix for Fluid Retention % swelling 30 tablet 3  . OVER THE COUNTER MEDICATION Take 1 tablet by mouth daily. Tumeric with Black Pepper    . potassium chloride (K-DUR) 10 MEQ tablet Take 1 tablet daily 90 tablet 3  . prednisoLONE acetate (PRED FORTE) 1 % ophthalmic suspension INSTILL 1 DROP INTO EACH EYE TWICE DAILY 5 mL 0   No current facility-administered medications on file prior to visit.    Allergies:  Allergies  Allergen Reactions  . Penicillins Swelling  . Sulfa Antibiotics Itching and Rash   Medical History:  She has Essential hypertension; DM (diabetes mellitus), type 2 with renal complications ([place]); Vitamin D deficiency; Obesity, morbid ([place]); OSA (obstructive sleep apnea); Hypothyroidism; Medication management; Macular degeneration; Generalized anxiety disorder; Encounter for Medicare annual wellness exam; Gout; Gluttony; Hyperlipidemia associated with type 2 diabetes mellitus ([place]); Venous stasis ulcer of right ankle limited to breakdown of skin without varicose veins ([place]); CKD stage 3 due to type 2 diabetes mellitus ([place]); Anemia; and Diabetic retinopathy associated with type 2 diabetes mellitus ([place]) on their problem list. Health Maintenance:   Immunization History  Administered Date(s) Administered  . DT 06/19/2015  . DTaP 12/09/2004  . Pneumococcal Conjugate-13 12/19/2014  . Pneumococcal Polysaccharide-23 03/02/2009   Last colonoscopy: 03/2014 normal Last mammogram: 04/2018, CAT A Last pap smear/pelvic exam: 2012 remote DEXA: 11/2015 normal Carotid US 2013 CXR 2009  Prior vaccinations: TD or Tdap: 2016 Influenza: declines Pneumococcal: 2010 declines Prevnar 13: 2015 Shingles/Zostavax:  declines  Eye exam Dr. Zigmund Daniel 03/29/2018 Report received and abstracted Dental: Dr. Delma Freeze, last exam a few years ago, will schedule  Patient Care Team: [name], MD as PCP - General (Internal Medicine) Warden Fillers, MD as Consulting Physician (Optometry) [name], MD as Consulting Physician (Ophthalmology) Inda Castle, MD (Inactive) as Consulting Physician (Gastroenterology) Druscilla Brownie, MD as Consulting Physician (Dermatology) Allyn Kenner, MD (Dermatology)  Surgical History:  She has a past surgical history that includes  eye procedure; Tubal ligation; and Dental examination under anesthesia. Family History:  Herfamily history includes Breast cancer in her mother; Diabetes in her maternal grandmother and sister; Hypertension in her sister; Renal Disease in her mother. Social History:  She reports that she has never smoked. She has never used smokeless tobacco. She reports that she drinks alcohol. She reports that she does not use drugs.  Review of Systems: Review of Systems  Constitutional: Negative for malaise/fatigue and weight loss.  HENT: Negative for hearing  loss and tinnitus.   Eyes: Negative for blurred vision and double vision.  Respiratory: Negative for cough, sputum production, shortness of breath and wheezing.   Cardiovascular: Negative for chest pain, palpitations, orthopnea, claudication, leg swelling and PND.  Gastrointestinal: Negative for abdominal pain, blood in stool, constipation, diarrhea, heartburn, melena, nausea and vomiting.  Genitourinary: Negative.   Musculoskeletal: Negative for falls, joint pain and myalgias.  Skin: Negative for rash.  Neurological: Negative for dizziness, tingling, sensory change, weakness and headaches.  Endo/Heme/Allergies: Negative for polydipsia.  Psychiatric/Behavioral: Negative.  Negative for depression, memory loss, substance abuse and suicidal ideas. The patient is not nervous/anxious and does  not have insomnia.   All other systems reviewed and are negative.   Physical Exam: Estimated body mass index is 44.88 kg/m as calculated from the following:   Height as of this encounter: 5' 2" (1.575 m).   Weight as of this encounter: 245 lb 6.4 oz (111.3 kg). BP 130/80   Pulse 85   Temp 97.7 F (36.5 C)   Ht 5' 2" (1.575 m)   Wt 245 lb 6.4 oz (111.3 kg)   SpO2 95%   BMI 44.88 kg/m  General Appearance: Well nourished, in no apparent distress.  Eyes: PERRLA, EOMs, conjunctiva no swelling or erythema, normal fundi and vessels.  Sinuses: No Frontal/maxillary tenderness  ENT/Mouth: Ext aud canals clear, normal light reflex with TMs without erythema, bulging. Good dentition. No erythema, swelling, or exudate on post pharynx. Tonsils not swollen or erythematous. Hearing normal.  Neck: Supple, thyroid normal. No bruits  Respiratory: Respiratory effort normal, BS equal bilaterally without rales, rhonchi, wheezing or stridor.  Cardio: RRR withou, rubs or gallops, does have 4/6 systolic blowing murmur. Symmetrical peripheral pulses with mild non-pitting edema of feet and ankles.  Chest: symmetric, with normal excursions and percussion.  Breasts: Symmetric, without lumps, nipple discharge, retractions.  Abdomen: Soft, nontender, no guarding, rebound, hernias, masses, or organomegaly.  Lymphatics: Non tender without lymphadenopathy.  Genitourinary: Defer Musculoskeletal: Full ROM all peripheral extremities,5/5 strength, and normal gait.  Skin: Warm, dry without rashes, lesions, ecchymosis. Neuro: Cranial nerves intact, reflexes equal bilaterally. Normal muscle tone, no cerebellar symptoms. Sensation intact.  Psych: Awake and oriented X 3, normal affect, Insight and Judgment appropriate.   EKG: No ST changes, ?   Izora Ribas 9:13 AM [facility]

## 2018-06-14 ENCOUNTER — Encounter: Payer: Self-pay | Admitting: Adult Health

## 2018-06-14 ENCOUNTER — Ambulatory Visit (INDEPENDENT_AMBULATORY_CARE_PROVIDER_SITE_OTHER): Payer: Medicare Other | Admitting: Adult Health

## 2018-06-14 VITALS — BP 130/80 | HR 85 | Temp 97.7°F | Ht 62.0 in | Wt 245.4 lb

## 2018-06-14 DIAGNOSIS — R011 Cardiac murmur, unspecified: Secondary | ICD-10-CM

## 2018-06-14 DIAGNOSIS — I872 Venous insufficiency (chronic) (peripheral): Secondary | ICD-10-CM

## 2018-06-14 DIAGNOSIS — E785 Hyperlipidemia, unspecified: Secondary | ICD-10-CM

## 2018-06-14 DIAGNOSIS — E1169 Type 2 diabetes mellitus with other specified complication: Secondary | ICD-10-CM

## 2018-06-14 DIAGNOSIS — Z79899 Other long term (current) drug therapy: Secondary | ICD-10-CM

## 2018-06-14 DIAGNOSIS — F411 Generalized anxiety disorder: Secondary | ICD-10-CM

## 2018-06-14 DIAGNOSIS — N183 Chronic kidney disease, stage 3 unspecified: Secondary | ICD-10-CM

## 2018-06-14 DIAGNOSIS — L97311 Non-pressure chronic ulcer of right ankle limited to breakdown of skin: Secondary | ICD-10-CM

## 2018-06-14 DIAGNOSIS — E559 Vitamin D deficiency, unspecified: Secondary | ICD-10-CM

## 2018-06-14 DIAGNOSIS — E1122 Type 2 diabetes mellitus with diabetic chronic kidney disease: Secondary | ICD-10-CM

## 2018-06-14 DIAGNOSIS — G4733 Obstructive sleep apnea (adult) (pediatric): Secondary | ICD-10-CM

## 2018-06-14 DIAGNOSIS — Z0001 Encounter for general adult medical examination with abnormal findings: Secondary | ICD-10-CM | POA: Diagnosis not present

## 2018-06-14 DIAGNOSIS — H3530 Unspecified macular degeneration: Secondary | ICD-10-CM

## 2018-06-14 DIAGNOSIS — I10 Essential (primary) hypertension: Secondary | ICD-10-CM

## 2018-06-14 DIAGNOSIS — D649 Anemia, unspecified: Secondary | ICD-10-CM

## 2018-06-14 DIAGNOSIS — M1A9XX0 Chronic gout, unspecified, without tophus (tophi): Secondary | ICD-10-CM

## 2018-06-14 DIAGNOSIS — E039 Hypothyroidism, unspecified: Secondary | ICD-10-CM

## 2018-06-14 DIAGNOSIS — E11319 Type 2 diabetes mellitus with unspecified diabetic retinopathy without macular edema: Secondary | ICD-10-CM

## 2018-06-14 DIAGNOSIS — N182 Chronic kidney disease, stage 2 (mild): Secondary | ICD-10-CM

## 2018-06-14 DIAGNOSIS — Z1211 Encounter for screening for malignant neoplasm of colon: Secondary | ICD-10-CM

## 2018-06-14 NOTE — Patient Instructions (Addendum)
Please schedule a dentist appointment  Recommend starting on silver sneakers (swimming/water aerobics), weight watchers  Weight goal for next visit: 240 lb    Aim for 7+ servings of fruits and vegetables daily  80+ fluid ounces of water or unsweet tea for healthy kidneys  Limit alcohol intake, avoid smoking  Limit animal fats in diet for cholesterol and heart health - choose grass fed whenever available  Aim for low stress - take time to unwind and care for your mental health  Aim for 150 min of moderate intensity exercise weekly for heart health, and weights twice weekly for bone health  Aim for 7-9 hours of sleep daily      When it comes to diets, agreement about the perfect plan isn't easy to find, even among the experts. Experts at the [employer] developed an idea known as the Healthy Eating Plate. Just imagine a plate divided into logical, healthy portions.  The emphasis is on diet quality:  Load up on vegetables and fruits - one-half of your plate: Aim for color and variety, and remember that potatoes don't count.  Go for whole grains - one-quarter of your plate: Whole wheat, barley, wheat berries, quinoa, oats, brown rice, and foods made with them. If you want pasta, go with whole wheat pasta.  Protein power - one-quarter of your plate: Fish, chicken, beans, and nuts are all healthy, versatile protein sources. Limit red meat.  The diet, however, does go beyond the plate, offering a few other suggestions.  Use healthy plant oils, such as olive, canola, soy, corn, sunflower and peanut. Check the labels, and avoid partially hydrogenated oil, which have unhealthy trans fats.  If you're thirsty, drink water. Coffee and tea are good in moderation, but skip sugary drinks and limit milk and dairy products to one or two daily servings.  The type of carbohydrate in the diet is more important than the amount. Some sources of carbohydrates, such as  vegetables, fruits, whole grains, and beans-are healthier than others.  Finally, stay active.

## 2018-06-17 ENCOUNTER — Other Ambulatory Visit: Payer: Self-pay | Admitting: Adult Health

## 2018-06-17 LAB — URINALYSIS W MICROSCOPIC + REFLEX CULTURE
Bilirubin Urine: NEGATIVE
Glucose, UA: NEGATIVE
HYALINE CAST: NONE SEEN /LPF
Hgb urine dipstick: NEGATIVE
Ketones, ur: NEGATIVE
Nitrites, Initial: NEGATIVE
Protein, ur: NEGATIVE
RBC / HPF: NONE SEEN /HPF (ref 0–2)
SPECIFIC GRAVITY, URINE: 1.011 (ref 1.001–1.03)

## 2018-06-17 LAB — COMPLETE METABOLIC PANEL WITH GFR
AG Ratio: 1.4 (calc) (ref 1.0–2.5)
ALBUMIN MSPROF: 4.3 g/dL (ref 3.6–5.1)
ALT: 19 U/L (ref 6–29)
AST: 21 U/L (ref 10–35)
Alkaline phosphatase (APISO): 84 U/L (ref 33–130)
BILIRUBIN TOTAL: 0.6 mg/dL (ref 0.2–1.2)
BUN / CREAT RATIO: 20 (calc) (ref 6–22)
BUN: 27 mg/dL — ABNORMAL HIGH (ref 7–25)
CHLORIDE: 100 mmol/L (ref 98–110)
CO2: 29 mmol/L (ref 20–32)
CREATININE: 1.38 mg/dL — AB (ref 0.60–0.93)
Calcium: 10.2 mg/dL (ref 8.6–10.4)
GFR, EST AFRICAN AMERICAN: 44 mL/min/{1.73_m2} — AB (ref >=60)
GFR, Est Non African American: 38 mL/min/{1.73_m2} — ABNORMAL LOW (ref >=60)
GLUCOSE: 129 mg/dL — AB (ref 65–99)
Globulin: 3.1 g/dL (calc) (ref 1.9–3.7)
Potassium: 4.4 mmol/L (ref 3.5–5.3)
Sodium: 140 mmol/L (ref 135–146)
TOTAL PROTEIN: 7.4 g/dL (ref 6.1–8.1)

## 2018-06-17 LAB — URINE CULTURE
MICRO NUMBER: 90757002
SPECIMEN QUALITY:: ADEQUATE

## 2018-06-17 LAB — CBC WITH DIFFERENTIAL/PLATELET
BASOS PCT: 0.8 %
Basophils Absolute: 88 cells/uL (ref 0–200)
EOS PCT: 1.5 %
Eosinophils Absolute: 165 cells/uL (ref 15–500)
HEMATOCRIT: 33.4 % — AB (ref 35.0–45.0)
HEMOGLOBIN: 10.8 g/dL — AB (ref 11.7–15.5)
LYMPHS ABS: 2453 {cells}/uL (ref 850–3900)
MCH: 28 pg (ref 27.0–33.0)
MCHC: 32.3 g/dL (ref 32.0–36.0)
MCV: 86.5 fL (ref 80.0–100.0)
MPV: 11.4 fL (ref 7.5–12.5)
Monocytes Relative: 6.1 %
NEUTROS ABS: 7623 {cells}/uL (ref 1500–7800)
Neutrophils Relative %: 69.3 %
Platelets: 412 10*3/uL — ABNORMAL HIGH (ref 140–400)
RBC: 3.86 10*6/uL (ref 3.80–5.10)
RDW: 16.4 % — ABNORMAL HIGH (ref 11.0–15.0)
Total Lymphocyte: 22.3 %
WBC mixed population: 671 cells/uL (ref 200–950)
WBC: 11 10*3/uL — AB (ref 3.8–10.8)

## 2018-06-17 LAB — FOLATE RBC: RBC FOLATE: 704 ng/mL (ref >280)

## 2018-06-17 LAB — VITAMIN B12: Vitamin B-12: 383 pg/mL (ref 200–1100)

## 2018-06-17 LAB — HEMOGLOBIN A1C
Hgb A1c MFr Bld: 5.7 % of total Hgb — ABNORMAL HIGH (ref <5.7)
Mean Plasma Glucose: 117 (calc)
eAG (mmol/L): 6.5 (calc)

## 2018-06-17 LAB — LIPID PANEL
CHOL/HDL RATIO: 3.6 (calc) (ref <5.0)
CHOLESTEROL: 125 mg/dL (ref <200)
HDL: 35 mg/dL — ABNORMAL LOW (ref >50)
LDL Cholesterol (Calc): 65 mg/dL (calc)
Non-HDL Cholesterol (Calc): 90 mg/dL (calc) (ref <130)
TRIGLYCERIDES: 174 mg/dL — AB (ref <150)

## 2018-06-17 LAB — MICROALBUMIN / CREATININE URINE RATIO
Creatinine, Urine: 63 mg/dL (ref 20–275)
MICROALB UR: 0.3 mg/dL
MICROALB/CREAT RATIO: 5 ug/mg{creat} (ref <30)

## 2018-06-17 LAB — TSH: TSH: 7.83 m[IU]/L — AB (ref 0.40–4.50)

## 2018-06-17 LAB — URIC ACID: URIC ACID, SERUM: 7 mg/dL (ref 2.5–7.0)

## 2018-06-17 LAB — VITAMIN D 25 HYDROXY (VIT D DEFICIENCY, FRACTURES): VIT D 25 HYDROXY: 70 ng/mL (ref 30–100)

## 2018-06-17 LAB — CULTURE INDICATED

## 2018-06-17 MED ORDER — CIPROFLOXACIN HCL 250 MG PO TABS: 250.0000 mg | ORAL_TABLET | Freq: Two times a day (BID) | ORAL | 0 refills | Status: AC

## 2018-06-21 ENCOUNTER — Encounter (INDEPENDENT_AMBULATORY_CARE_PROVIDER_SITE_OTHER): Payer: Medicare Other | Admitting: Ophthalmology

## 2018-06-21 DIAGNOSIS — E113312 Type 2 diabetes mellitus with moderate nonproliferative diabetic retinopathy with macular edema, left eye: Secondary | ICD-10-CM

## 2018-06-21 DIAGNOSIS — E11311 Type 2 diabetes mellitus with unspecified diabetic retinopathy with macular edema: Secondary | ICD-10-CM | POA: Diagnosis not present

## 2018-06-21 DIAGNOSIS — H34812 Central retinal vein occlusion, left eye, with macular edema: Secondary | ICD-10-CM

## 2018-06-21 DIAGNOSIS — H35033 Hypertensive retinopathy, bilateral: Secondary | ICD-10-CM | POA: Diagnosis not present

## 2018-06-21 DIAGNOSIS — I10 Essential (primary) hypertension: Secondary | ICD-10-CM

## 2018-06-21 DIAGNOSIS — H43813 Vitreous degeneration, bilateral: Secondary | ICD-10-CM

## 2018-06-21 DIAGNOSIS — E113291 Type 2 diabetes mellitus with mild nonproliferative diabetic retinopathy without macular edema, right eye: Secondary | ICD-10-CM

## 2018-06-21 DIAGNOSIS — H2511 Age-related nuclear cataract, right eye: Secondary | ICD-10-CM

## 2018-06-29 ENCOUNTER — Other Ambulatory Visit: Payer: Self-pay

## 2018-06-29 ENCOUNTER — Ambulatory Visit (HOSPITAL_COMMUNITY): Payer: Medicare Other | Attending: Cardiovascular Disease

## 2018-06-29 DIAGNOSIS — Z6841 Body Mass Index (BMI) 40.0 and over, adult: Secondary | ICD-10-CM | POA: Diagnosis not present

## 2018-06-29 DIAGNOSIS — N189 Chronic kidney disease, unspecified: Secondary | ICD-10-CM | POA: Diagnosis not present

## 2018-06-29 DIAGNOSIS — E669 Obesity, unspecified: Secondary | ICD-10-CM | POA: Diagnosis not present

## 2018-06-29 DIAGNOSIS — I129 Hypertensive chronic kidney disease with stage 1 through stage 4 chronic kidney disease, or unspecified chronic kidney disease: Secondary | ICD-10-CM | POA: Diagnosis not present

## 2018-06-29 DIAGNOSIS — E1122 Type 2 diabetes mellitus with diabetic chronic kidney disease: Secondary | ICD-10-CM | POA: Insufficient documentation

## 2018-06-29 DIAGNOSIS — G4733 Obstructive sleep apnea (adult) (pediatric): Secondary | ICD-10-CM | POA: Insufficient documentation

## 2018-06-29 DIAGNOSIS — E785 Hyperlipidemia, unspecified: Secondary | ICD-10-CM | POA: Diagnosis not present

## 2018-06-29 DIAGNOSIS — R011 Cardiac murmur, unspecified: Secondary | ICD-10-CM | POA: Diagnosis not present

## 2018-07-27 ENCOUNTER — Other Ambulatory Visit: Payer: Self-pay | Admitting: Internal Medicine

## 2018-08-06 ENCOUNTER — Other Ambulatory Visit: Payer: Self-pay

## 2018-08-06 MED ORDER — MELOXICAM 15 MG PO TABS: 15.0000 mg | ORAL_TABLET | Freq: Every day | ORAL | 3 refills | Status: DC | PRN

## 2018-08-09 ENCOUNTER — Encounter (INDEPENDENT_AMBULATORY_CARE_PROVIDER_SITE_OTHER): Payer: Medicare Other | Admitting: Ophthalmology

## 2018-08-09 DIAGNOSIS — I10 Essential (primary) hypertension: Secondary | ICD-10-CM | POA: Diagnosis not present

## 2018-08-09 DIAGNOSIS — H34812 Central retinal vein occlusion, left eye, with macular edema: Secondary | ICD-10-CM | POA: Diagnosis not present

## 2018-08-09 DIAGNOSIS — H43813 Vitreous degeneration, bilateral: Secondary | ICD-10-CM | POA: Diagnosis not present

## 2018-08-09 DIAGNOSIS — H35033 Hypertensive retinopathy, bilateral: Secondary | ICD-10-CM | POA: Diagnosis not present

## 2018-08-09 DIAGNOSIS — H2511 Age-related nuclear cataract, right eye: Secondary | ICD-10-CM

## 2018-08-30 ENCOUNTER — Telehealth: Payer: Self-pay

## 2018-08-30 NOTE — Telephone Encounter (Signed)
error 

## 2018-09-20 ENCOUNTER — Encounter (INDEPENDENT_AMBULATORY_CARE_PROVIDER_SITE_OTHER): Payer: Medicare Other | Admitting: Ophthalmology

## 2018-09-20 DIAGNOSIS — E11311 Type 2 diabetes mellitus with unspecified diabetic retinopathy with macular edema: Secondary | ICD-10-CM

## 2018-09-20 DIAGNOSIS — I10 Essential (primary) hypertension: Secondary | ICD-10-CM | POA: Diagnosis not present

## 2018-09-20 DIAGNOSIS — H35033 Hypertensive retinopathy, bilateral: Secondary | ICD-10-CM | POA: Diagnosis not present

## 2018-09-20 DIAGNOSIS — E113312 Type 2 diabetes mellitus with moderate nonproliferative diabetic retinopathy with macular edema, left eye: Secondary | ICD-10-CM

## 2018-09-20 DIAGNOSIS — H34812 Central retinal vein occlusion, left eye, with macular edema: Secondary | ICD-10-CM | POA: Diagnosis not present

## 2018-09-20 DIAGNOSIS — E113391 Type 2 diabetes mellitus with moderate nonproliferative diabetic retinopathy without macular edema, right eye: Secondary | ICD-10-CM

## 2018-09-20 DIAGNOSIS — H43813 Vitreous degeneration, bilateral: Secondary | ICD-10-CM

## 2018-09-24 ENCOUNTER — Ambulatory Visit (INDEPENDENT_AMBULATORY_CARE_PROVIDER_SITE_OTHER): Payer: Medicare Other | Admitting: Internal Medicine

## 2018-09-24 ENCOUNTER — Other Ambulatory Visit: Payer: Self-pay | Admitting: *Deleted

## 2018-09-24 ENCOUNTER — Encounter: Payer: Self-pay | Admitting: Internal Medicine

## 2018-09-24 VITALS — BP 138/76 | HR 76 | Temp 97.3°F | Resp 16 | Ht 62.0 in | Wt 244.8 lb

## 2018-09-24 DIAGNOSIS — E559 Vitamin D deficiency, unspecified: Secondary | ICD-10-CM | POA: Diagnosis not present

## 2018-09-24 DIAGNOSIS — E782 Mixed hyperlipidemia: Secondary | ICD-10-CM | POA: Diagnosis not present

## 2018-09-24 DIAGNOSIS — E1122 Type 2 diabetes mellitus with diabetic chronic kidney disease: Secondary | ICD-10-CM | POA: Diagnosis not present

## 2018-09-24 DIAGNOSIS — M1A9XX0 Chronic gout, unspecified, without tophus (tophi): Secondary | ICD-10-CM

## 2018-09-24 DIAGNOSIS — I10 Essential (primary) hypertension: Secondary | ICD-10-CM | POA: Diagnosis not present

## 2018-09-24 DIAGNOSIS — E039 Hypothyroidism, unspecified: Secondary | ICD-10-CM

## 2018-09-24 DIAGNOSIS — G4733 Obstructive sleep apnea (adult) (pediatric): Secondary | ICD-10-CM

## 2018-09-24 DIAGNOSIS — N183 Chronic kidney disease, stage 3 unspecified: Secondary | ICD-10-CM

## 2018-09-24 DIAGNOSIS — Z794 Long term (current) use of insulin: Secondary | ICD-10-CM

## 2018-09-24 DIAGNOSIS — Z6841 Body Mass Index (BMI) 40.0 and over, adult: Secondary | ICD-10-CM

## 2018-09-24 DIAGNOSIS — Z79899 Other long term (current) drug therapy: Secondary | ICD-10-CM

## 2018-09-24 MED ORDER — PREDNISOLONE ACETATE 1 % OP SUSP: OPHTHALMIC | 0 refills | Status: DC

## 2018-09-24 NOTE — Progress Notes (Signed)
[facility] [name], M.D.     Uvaldo Bristle. Silverio Lay, P.A.-C [name], [place] [address] Telephone [phone] Telefax [phone]      This very nice 72 y.o. MBF presents for ollow-up HTN, HLD, Insulin Requiring T2_DM  and Vitamin D Deficiency. Patient has OSA - using CPAP sporadically and infrequently. Patient has hx/o Gout quiescent on treatment.      HTN predates since 1998. Patient's BP has been controlled at home and patient denies any cardiac symptoms as chest pain, palpitations, shortness of breath, dizziness or ankle swelling. Today's BP is at goal - 138/76.      Patient's hyperlipidemia is controlled with diet and medications. Patient denies myalgias or other medication SE's. Last lipids were at goal albeit elevated Trig's: Lab Results  Component Value Date   CHOL 125 06/14/2018   HDL 35 (L) 06/14/2018   LDLCALC 65 06/14/2018   TRIG 174 (H) 06/14/2018   CHOLHDL 3.6 06/14/2018      Patient has hx/o Morbid Obesity w/expanding BMI now 44+ and T2_NIDDM (2007) /CKD3  Predating  and patient denies reactive hypoglycemic symptoms, visual blurring, diabetic polys or paresthesias.  She reports CBG's usu betw 90-180 mg%. Last A1c was near goal: Lab Results  Component Value Date   HGBA1C 5.7 (H) 06/14/2018      Patient has been on Thyroid replacement since 2004.      Finally, patient has history of Vitamin D Deficiency ("38"/2016)  and last Vitamin D was at goal: Lab Results  Component Value Date   VD25OH 63 06/14/2018   Current Outpatient Medications on File Prior to Visit  Medication Sig  . allopurinol (ZYLOPRIM) 300 MG tablet TAKE 1 TABLET BY MOUTH ONCE DAILY  . aspirin EC 81 MG tablet Take 81 mg by mouth 2 (two) times daily.  Marland Kitchen atenolol (TENORMIN) 100 MG tablet TAKE ONE TABLET BY MOUTH ONCE DAILY (Patient taking differently: TAKE ONE/HALF TABLET BY MOUTH ONCE  DAILY)  . BESIVANCE 0.6 % SUSP Place 1 drop into the left eye 4 (four) times daily.   . Blood Glucose Monitoring Suppl (ONE TOUCH ULTRA 2) w/Device KIT check blood sugar 3 times daily-DX-E10.9  . Cholecalciferol (VITAMIN D3) 5000 units CAPS Take 10,000 Units by mouth daily.  . colchicine 0.6 MG tablet Take 1 tablet (0.6 mg total) by mouth daily.  . diazepam (VALIUM) 5 MG tablet TAKE ONE TABLET BY MOUTH AT BEDTIME AS NEEDED FOR ANXIETY  . enalapril (VASOTEC) 20 MG tablet TAKE ONE TABLET BY MOUTH ONCE DAILY FOR BLOOD PRESSURE  . ferrous fumarate (HEMOCYTE - 106 MG FE) 325 (106 Fe) MG TABS tablet Take 325 mg of iron by mouth daily.   . furosemide (LASIX) 40 MG tablet TAKE 1 TABLET BY MOUTH TWICE DAILY FOR BLOOD PRESSURE AND FOR FLUID  . glucose blood (ONE TOUCH ULTRA TEST) test strip Check blood sugar 3 times daily-DX-E11.9 and Z79.4  . hyoscyamine (LEVSIN, ANASPAZ) 0.125 MG tablet Take 1 tablet (0.125 mg total) by mouth every 6 (six) hours as needed.  . insulin NPH-regular Human (NOVOLIN 70/30) (70-30) 100 UNIT/ML injection Inject into the skin. Patient injects 25 units in the AM and 15 units in the PM.  . Lancets (ONETOUCH ULTRASOFT) lancets Use as instructed Dx. Code: E11.22  . Lancets (ONETOUCH ULTRASOFT) lancets Check blood sugar 3 times daily-DX-E10.9  . levothyroxine (SYNTHROID) 100 MCG tablet Take 1 tablet 1st in the morning on  an empty stomach with only water for 30 minutes  . meloxicam (MOBIC) 15 MG tablet Take 1 tablet (15 mg total) by mouth daily as needed for pain.  Marland Kitchen OVER THE COUNTER MEDICATION Take 1 tablet by mouth daily. Tumeric with Black Pepper  . potassium chloride (K-DUR) 10 MEQ tablet Take 1 tablet daily  . prednisoLONE acetate (PRED FORTE) 1 % ophthalmic suspension INSTILL 1 DROP INTO EACH EYE TWICE DAILY  . metFORMIN (GLUCOPHAGE XR) 500 MG 24 hr tablet Take 2 tablets 2 x/ day with meals for DIABETES  . metolazone (ZAROXOLYN) 5 MG tablet Takle 1 tablet daily with Lasix for  Fluid Retention % swelling (Patient not taking: Reported on 09/24/2018)   No current facility-administered medications on file prior to visit.    Allergies  Allergen Reactions  . Penicillins Swelling  . Sulfa Antibiotics Itching and Rash   Past Medical History:  Diagnosis Date  . Allergic rhinitis   . Allergy   . Anxiety    with medical procedures  . Hyperlipidemia   . Hypertension   . Obesity   . OSA (obstructive sleep apnea)    wears CPAP  . Type II or unspecified type diabetes mellitus with renal manifestations, not stated as uncontrolled(250.40)   . Unspecified hypothyroidism   . Vitamin D deficiency    Health Maintenance  Topic Date Due  . INFLUENZA VACCINE  07/22/2018  . PNA vac Low Risk Adult (2 of 2 - PPSV23) 12/20/2018 (Originally 12/20/2015)  . HEMOGLOBIN A1C  12/14/2018  . OPHTHALMOLOGY EXAM  03/30/2019  . FOOT EXAM  06/15/2019  . MAMMOGRAM  04/21/2020  . TETANUS/TDAP  02/27/2024  . COLONOSCOPY  04/17/2024  . DEXA SCAN  Completed  . Hepatitis C Screening  Completed   Immunization History  Administered Date(s) Administered  . DT 06/19/2015  . DTaP 12/09/2004  . Pneumococcal Conjugate-13 12/19/2014  . Pneumococcal Polysaccharide-23 03/02/2009   Last Colon -  04/17/2014 - Dr Deatra Ina - recc 10 yr f/u due Apr 2025  Last MGM -  04/21/2018  Past Surgical History:  Procedure Laterality Date  .  eye procedure     have treatment where a needle is stuck into left eye- since June 2014; now every six weeks  . DENTAL EXAMINATION UNDER ANESTHESIA    . TUBAL LIGATION     Family History  Problem Relation Age of Onset  . Breast cancer Mother   . Renal Disease Mother   . Hypertension Sister   . Diabetes Sister   . Diabetes Maternal Grandmother   . Colon cancer Neg Hx   . Esophageal cancer Neg Hx   . Stomach cancer Neg Hx   . Rectal cancer Neg Hx    Social History   Tobacco Use  . Smoking status: Never Smoker  . Smokeless tobacco: Never Used  Substance Use  Topics  . Alcohol use: Yes    Alcohol/week: 0.0 standard drinks    Comment: once a month  . Drug use: No    ROS Constitutional: Denies fever, chills, weight loss/gain, headaches, insomnia,  night sweats, and change in appetite. Does c/o fatigue. Eyes: Denies redness, blurred vision, diplopia, discharge, itchy, watery eyes.  ENT: Denies discharge, congestion, post nasal drip, epistaxis, sore throat, earache, hearing loss, dental pain, Tinnitus, Vertigo, Sinus pain, snoring.  Cardio: Denies chest pain, palpitations, irregular heartbeat, syncope, dyspnea, diaphoresis, orthopnea, PND, claudication, edema Respiratory: denies cough, dyspnea, DOE, pleurisy, hoarseness, laryngitis, wheezing.  Gastrointestinal: Denies dysphagia, heartburn, reflux, water brash, pain,  cramps, nausea, vomiting, bloating, diarrhea, constipation, hematemesis, melena, hematochezia, jaundice, hemorrhoids Genitourinary: Denies dysuria, frequency, urgency, nocturia, hesitancy, discharge, hematuria, flank pain Breast: Breast lumps, nipple discharge, bleeding.  Musculoskeletal: Denies arthralgia, myalgia, stiffness, Jt. Swelling, pain, limp, and strain/sprain. Denies falls. Skin: Denies puritis, rash, hives, warts, acne, eczema, changing in skin lesion Neuro: No weakness, tremor, incoordination, spasms, paresthesia, pain Psychiatric: Denies confusion, memory loss, sensory loss. Denies Depression. Endocrine: Denies change in weight, skin, hair change, nocturia, and paresthesia, diabetic polys, visual blurring, hyper / hypo glycemic episodes.  Heme/Lymph: No excessive bleeding, bruising, enlarged lymph nodes.  Physical Exam  BP 138/76   Pulse 76   Temp (!) 97.3 F (36.3 C)   Resp 16   Ht '5\' 2"'$  (1.575 m)   Wt 244 lb 12.8 oz (111 kg)   BMI 44.77 kg/m   General Appearance: Over nourished, well groomed and in no apparent distress.  Eyes: PERRLA, EOMs, conjunctiva no swelling or erythema, normal fundi and vessels. Sinuses:  No frontal/maxillary tenderness ENT/Mouth: EACs patent / TMs  nl. Nares clear without erythema, swelling, mucoid exudates. Oral hygiene is good. No erythema, swelling, or exudate. Tongue normal, non-obstructing. Tonsils not swollen or erythematous. Hearing normal.  Neck: Supple, thyroid not palpable. No bruits, nodes or JVD. Respiratory: Respiratory effort normal.  BS equal and clear bilateral without rales, rhonci, wheezing or stridor. Cardio: Heart sounds are normal with regular rate and rhythm and no murmurs, rubs or gallops. Peripheral pulses are normal and equal bilaterally without edema. No aortic or femoral bruits. Chest: symmetric with normal excursions and percussion. Breasts: Symmetric, without lumps, nipple discharge, retractions, or fibrocystic changes.  Abdomen: Rotund, soft with bowel sounds active. Nontender, no guarding, rebound, hernias, masses, or organomegaly.  Lymphatics: Non tender without lymphadenopathy.  Musculoskeletal: Full ROM all peripheral extremities, joint stability, 5/5 strength, and normal gait. Skin: Warm and dry without rashes, lesions, cyanosis, clubbing or  ecchymosis.  Neuro: Cranial nerves intact, reflexes equal bilaterally. Normal muscle tone, no cerebellar symptoms. Sensation intact.  Pysch: Alert and oriented X 3, normal affect, Insight and Judgment appropriate.   Assessment and Plan  1. Essential hypertension  - CBC with Differential/Platelet - COMPLETE METABOLIC PANEL WITH GFR - Magnesium - TSH  2. Hyperlipidemia, mixed  - Lipid panel - TSH  3. Type 2 diabetes mellitus with stage 3 chronic kidney disease, with long-term current use of insulin (HCC)  - Hemoglobin A1c  4. Vitamin D deficiency  - VITAMIN D 25 Hydroxyl  5. Hypothyroidism  - TSH  6. Chronic gout  - Uric acid  7. Class 3 severe obesity due to excess calories with serious comorbidity and body mass index (BMI) of 40.0 to 44.9 in adult ([place])   8. OSA (obstructive sleep  apnea)   9. Medication management  - CBC with Differential/Platelet - COMPLETE METABOLIC PANEL WITH GFR - Magnesium - Lipid panel - TSH - Hemoglobin A1c - VITAMIN D 25 Hydroxyl - Uric acid    Patient was counseled in prudent diet to achieve/maintain BMI less than 25 for weight control, BP monitoring, regular exercise and medications. Discussed med's effects and SE's. Screening labs and tests as requested with regular follow-up as recommended. Over 40 minutes of exam, counseling, chart review and high complex critical decision making was performed.

## 2018-09-24 NOTE — Patient Instructions (Addendum)
We Do NOT Approve of  [facility], [employer]  Our Patients  To Do Home Visits & We Do NOT Approve of [employer] > > > > > > > > > > > > > > > > > > > > > > > > > > > > > > > > > > > > > > > Recommend Adult Low Dose Aspirin or  coated  Aspirin 81 mg daily  To reduce risk of Colon Cancer 20 %,  Skin Cancer 26 % ,  Melanoma 46%  and  Pancreatic cancer 60% +++++++++++++++++++++++++ Vitamin D goal  is between 70-100.  Please make sure that you are taking your Vitamin D as directed.  It is very important as a natural anti-inflammatory  helping hair, skin, and nails, as well as reducing stroke and heart attack risk.  It helps your bones and helps with mood. It also decreases numerous cancer risks so please take it as directed.  Low Vit D is associated with a 200-300% higher risk for CANCER  and 200-300% higher risk for HEART   ATTACK  &  STROKE.   .....................................Marland Kitchen It is also associated with higher death rate at younger ages,  autoimmune diseases like Rheumatoid arthritis, Lupus, Multiple Sclerosis.    Also many other serious conditions, like depression, Alzheimer's Dementia, infertility, muscle aches, fatigue, fibromyalgia - just to name a few. ++++++++++++++++++++ Recommend the book "The END of DIETING" by Dr Excell Seltzer  & the book "The END of DIABETES " by Dr Excell Seltzer At [email or web address] - get book & Audio CD's    Being diabetic has a  300% increased risk for heart attack, stroke, cancer, and alzheimer- type vascular dementia. It is very important that you work harder with diet by avoiding all foods that are white. Avoid white rice (brown & wild rice is OK), white potatoes (sweetpotatoes in moderation is OK), White bread or wheat bread or anything made out of white flour like bagels, donuts, rolls, buns, biscuits, cakes, pastries, cookies, pizza crust, and pasta (made from white flour & egg whites) - vegetarian pasta or spinach or wheat  pasta is OK. Multigrain breads like Arnold's or Pepperidge Farm, or multigrain sandwich thins or flatbreads.  Diet, exercise and weight loss can reverse and cure diabetes in the early stages.  Diet, exercise and weight loss is very important in the control and prevention of complications of diabetes which affects every system in your body, ie. Brain - dementia/stroke, eyes - glaucoma/blindness, heart - heart attack/heart failure, kidneys - dialysis, stomach - gastric paralysis, intestines - malabsorption, nerves - severe painful neuritis, circulation - gangrene & loss of a leg(s), and finally cancer and Alzheimers.    I recommend avoid fried & greasy foods,  sweets/candy, white rice (brown or wild rice or Quinoa is OK), white potatoes (sweet potatoes are OK) - anything made from white flour - bagels, doughnuts, rolls, buns, biscuits,white and wheat breads, pizza crust and traditional pasta made of white flour & egg white(vegetarian pasta or spinach or wheat pasta is OK).  Multi-grain bread is OK - like multi-grain flat bread or sandwich thins. Avoid alcohol in excess. Exercise is also important.    Eat all the vegetables you want - avoid meat, especially red meat and dairy - especially cheese.  Cheese is the most concentrated form of trans-fats which is the worst thing to clog up our arteries. Veggie cheese is OK which can be found in the fresh produce section at  [employer] or [employer] or [employer]  +++++++++++++++++++++ DASH Eating Plan  DASH stands for "Dietary Approaches to Stop Hypertension."   The DASH eating plan is a healthy eating plan that has been shown to reduce high blood pressure (hypertension). Additional health benefits may include reducing the risk of type 2 diabetes mellitus, heart disease, and stroke. The DASH eating plan may also help with weight loss. WHAT DO I NEED TO KNOW ABOUT THE DASH EATING PLAN? For the DASH eating plan, you will follow these general guidelines:  Choose  foods with a percent daily value for sodium of less than 5% (as listed on the food label).  Use salt-free seasonings or herbs instead of table salt or sea salt.  Check with your health care provider or pharmacist before using salt substitutes.  Eat lower-sodium products, often labeled as "lower sodium" or "no salt added."  Eat fresh foods.  Eat more vegetables, fruits, and low-fat dairy products.  Choose whole grains. Look for the word "whole" as the first word in the ingredient list.  Choose fish   Limit sweets, desserts, sugars, and sugary drinks.  Choose heart-healthy fats.  Eat veggie cheese   Eat more home-cooked food and less restaurant, buffet, and fast food.  Limit fried foods.  Cook foods using methods other than frying.  Limit canned vegetables. If you do use them, rinse them well to decrease the sodium.  When eating at a restaurant, ask that your food be prepared with less salt, or no salt if possible.                      WHAT FOODS CAN I EAT? Read Dr Fara Olden Fuhrman's books on The End of Dieting & The End of Diabetes  Grains Whole grain or whole wheat bread. Brown rice. Whole grain or whole wheat pasta. Quinoa, bulgur, and whole grain cereals. Low-sodium cereals. Corn or whole wheat flour tortillas. Whole grain cornbread. Whole grain crackers. Low-sodium crackers.  Vegetables Fresh or frozen vegetables (raw, steamed, roasted, or grilled). Low-sodium or reduced-sodium tomato and vegetable juices. Low-sodium or reduced-sodium tomato sauce and paste. Low-sodium or reduced-sodium canned vegetables.   Fruits All fresh, canned (in natural juice), or frozen fruits.  Protein Products  All fish and seafood.  Dried beans, peas, or lentils. Unsalted nuts and seeds. Unsalted canned beans.  Dairy Low-fat dairy products, such as skim or 1% milk, 2% or reduced-fat cheeses, low-fat ricotta or cottage cheese, or plain low-fat yogurt. Low-sodium or reduced-sodium  cheeses.  Fats and Oils Tub margarines without trans fats. Light or reduced-fat mayonnaise and salad dressings (reduced sodium). Avocado. Safflower, olive, or canola oils. Natural peanut or almond butter.  Other Unsalted popcorn and pretzels. The items listed above may not be a complete list of recommended foods or beverages. Contact your dietitian for more options.  +++++++++++++++  WHAT FOODS ARE NOT RECOMMENDED? Grains/ White flour or wheat flour White bread. White pasta. White rice. Refined cornbread. Bagels and croissants. Crackers that contain trans fat.  Vegetables  Creamed or fried vegetables. Vegetables in a . Regular canned vegetables. Regular canned tomato sauce and paste. Regular tomato and vegetable juices.  Fruits Dried fruits. Canned fruit in light or heavy syrup. Fruit juice.  Meat and Other Protein Products Meat in general - RED meat & White meat.  Fatty cuts of meat. Ribs, chicken wings, all processed meats as bacon, sausage, bologna, salami, fatback, hot dogs, bratwurst and packaged luncheon meats.  Dairy Whole or 2%  milk, cream, half-and-half, and cream cheese. Whole-fat or sweetened yogurt. Full-fat cheeses or blue cheese. Non-dairy creamers and whipped toppings. Processed cheese, cheese spreads, or cheese curds.  Condiments Onion and garlic salt, seasoned salt, table salt, and sea salt. Canned and packaged gravies. Worcestershire sauce. Tartar sauce. Barbecue sauce. Teriyaki sauce. Soy sauce, including reduced sodium. Steak sauce. Fish sauce. Oyster sauce. Cocktail sauce. Horseradish. Ketchup and mustard. Meat flavorings and tenderizers. Bouillon cubes. Hot sauce. Tabasco sauce. Marinades. Taco seasonings. Relishes.  Fats and Oils Butter, stick margarine, lard, shortening and bacon fat. Coconut, palm kernel, or palm oils. Regular salad dressings.  Pickles and olives. Salted popcorn and pretzels.  The items listed above may not be a complete list of foods and  beverages to avoid. Recommend Adult Low Dose Aspirin or  coated  Aspirin 81 mg daily  To reduce risk of Colon Cancer 20 %,  Skin Cancer 26 % ,  Melanoma 46%  and  Pancreatic cancer 60% +++++++++++++++++++++++++ Vitamin D goal  is between 70-100.  Please make sure that you are taking your Vitamin D as directed.  It is very important as a natural anti-inflammatory  helping hair, skin, and nails, as well as reducing stroke and heart attack risk.  It helps your bones and helps with mood. It also decreases numerous cancer risks so please take it as directed.  Low Vit D is associated with a 200-300% higher risk for CANCER  and 200-300% higher risk for HEART   ATTACK  &  STROKE.   .....................................Marland Kitchen It is also associated with higher death rate at younger ages,  autoimmune diseases like Rheumatoid arthritis, Lupus, Multiple Sclerosis.    Also many other serious conditions, like depression, Alzheimer's Dementia, infertility, muscle aches, fatigue, fibromyalgia - just to name a few. ++++++++++++++++++++ Recommend the book "The END of DIETING" by Dr Excell Seltzer  & the book "The END of DIABETES " by Dr Excell Seltzer At [email or web address] - get book & Audio CD's    Being diabetic has a  300% increased risk for heart attack, stroke, cancer, and alzheimer- type vascular dementia. It is very important that you work harder with diet by avoiding all foods that are white. Avoid white rice (brown & wild rice is OK), white potatoes (sweetpotatoes in moderation is OK), White bread or wheat bread or anything made out of white flour like bagels, donuts, rolls, buns, biscuits, cakes, pastries, cookies, pizza crust, and pasta (made from white flour & egg whites) - vegetarian pasta or spinach or wheat pasta is OK. Multigrain breads like Arnold's or Pepperidge Farm, or multigrain sandwich thins or flatbreads.  Diet, exercise and weight loss can reverse and cure diabetes in the early stages.  Diet,  exercise and weight loss is very important in the control and prevention of complications of diabetes which affects every system in your body, ie. Brain - dementia/stroke, eyes - glaucoma/blindness, heart - heart attack/heart failure, kidneys - dialysis, stomach - gastric paralysis, intestines - malabsorption, nerves - severe painful neuritis, circulation - gangrene & loss of a leg(s), and finally cancer and Alzheimers.    I recommend avoid fried & greasy foods,  sweets/candy, white rice (brown or wild rice or Quinoa is OK), white potatoes (sweet potatoes are OK) - anything made from white flour - bagels, doughnuts, rolls, buns, biscuits,white and wheat breads, pizza crust and traditional pasta made of white flour & egg white(vegetarian pasta or spinach or wheat pasta is OK).  Multi-grain bread is OK - like multi-grain  flat bread or sandwich thins. Avoid alcohol in excess. Exercise is also important.    Eat all the vegetables you want - avoid meat, especially red meat and dairy - especially cheese.  Cheese is the most concentrated form of trans-fats which is the worst thing to clog up our arteries. Veggie cheese is OK which can be found in the fresh produce section at [employer] or [employer] or [employer]  +++++++++++++++++++++ DASH Eating Plan  DASH stands for "Dietary Approaches to Stop Hypertension."   The DASH eating plan is a healthy eating plan that has been shown to reduce high blood pressure (hypertension). Additional health benefits may include reducing the risk of type 2 diabetes mellitus, heart disease, and stroke. The DASH eating plan may also help with weight loss. WHAT DO I NEED TO KNOW ABOUT THE DASH EATING PLAN? For the DASH eating plan, you will follow these general guidelines:  Choose foods with a percent daily value for sodium of less than 5% (as listed on the food label).  Use salt-free seasonings or herbs instead of table salt or sea salt.  Check with your health care  provider or pharmacist before using salt substitutes.  Eat lower-sodium products, often labeled as "lower sodium" or "no salt added."  Eat fresh foods.  Eat more vegetables, fruits, and low-fat dairy products.  Choose whole grains. Look for the word "whole" as the first word in the ingredient list.  Choose fish   Limit sweets, desserts, sugars, and sugary drinks.  Choose heart-healthy fats.  Eat veggie cheese   Eat more home-cooked food and less restaurant, buffet, and fast food.  Limit fried foods.  Cook foods using methods other than frying.  Limit canned vegetables. If you do use them, rinse them well to decrease the sodium.  When eating at a restaurant, ask that your food be prepared with less salt, or no salt if possible.                      WHAT FOODS CAN I EAT? Read Dr Fara Olden Fuhrman's books on The End of Dieting & The End of Diabetes  Grains Whole grain or whole wheat bread. Brown rice. Whole grain or whole wheat pasta. Quinoa, bulgur, and whole grain cereals. Low-sodium cereals. Corn or whole wheat flour tortillas. Whole grain cornbread. Whole grain crackers. Low-sodium crackers.  Vegetables Fresh or frozen vegetables (raw, steamed, roasted, or grilled). Low-sodium or reduced-sodium tomato and vegetable juices. Low-sodium or reduced-sodium tomato sauce and paste. Low-sodium or reduced-sodium canned vegetables.   Fruits All fresh, canned (in natural juice), or frozen fruits.  Protein Products  All fish and seafood.  Dried beans, peas, or lentils. Unsalted nuts and seeds. Unsalted canned beans.  Dairy Low-fat dairy products, such as skim or 1% milk, 2% or reduced-fat cheeses, low-fat ricotta or cottage cheese, or plain low-fat yogurt. Low-sodium or reduced-sodium cheeses.  Fats and Oils Tub margarines without trans fats. Light or reduced-fat mayonnaise and salad dressings (reduced sodium). Avocado. Safflower, olive, or canola oils. Natural peanut or almond  butter.  Other Unsalted popcorn and pretzels. The items listed above may not be a complete list of recommended foods or beverages. Contact your dietitian for more options.  +++++++++++++++  WHAT FOODS ARE NOT RECOMMENDED? Grains/ White flour or wheat flour White bread. White pasta. White rice. Refined cornbread. Bagels and croissants. Crackers that contain trans fat.  Vegetables  Creamed or fried vegetables. Vegetables in a . Regular canned vegetables. Regular canned  tomato sauce and paste. Regular tomato and vegetable juices.  Fruits Dried fruits. Canned fruit in light or heavy syrup. Fruit juice.  Meat and Other Protein Products Meat in general - RED meat & White meat.  Fatty cuts of meat. Ribs, chicken wings, all processed meats as bacon, sausage, bologna, salami, fatback, hot dogs, bratwurst and packaged luncheon meats.  Dairy Whole or 2% milk, cream, half-and-half, and cream cheese. Whole-fat or sweetened yogurt. Full-fat cheeses or blue cheese. Non-dairy creamers and whipped toppings. Processed cheese, cheese spreads, or cheese curds.  Condiments Onion and garlic salt, seasoned salt, table salt, and sea salt. Canned and packaged gravies. Worcestershire sauce. Tartar sauce. Barbecue sauce. Teriyaki sauce. Soy sauce, including reduced sodium. Steak sauce. Fish sauce. Oyster sauce. Cocktail sauce. Horseradish. Ketchup and mustard. Meat flavorings and tenderizers. Bouillon cubes. Hot sauce. Tabasco sauce. Marinades. Taco seasonings. Relishes.  Fats and Oils Butter, stick margarine, lard, shortening and bacon fat. Coconut, palm kernel, or palm oils. Regular salad dressings.  Pickles and olives. Salted popcorn and pretzels.  The items listed above may not be a complete list of foods and beverages to avoid.

## 2018-09-25 ENCOUNTER — Encounter: Payer: Self-pay | Admitting: Internal Medicine

## 2018-09-25 LAB — COMPLETE METABOLIC PANEL WITH GFR
AG RATIO: 1.4 (calc) (ref 1.0–2.5)
ALBUMIN MSPROF: 4.3 g/dL (ref 3.6–5.1)
ALT: 10 U/L (ref 6–29)
AST: 16 U/L (ref 10–35)
Alkaline phosphatase (APISO): 78 U/L (ref 33–130)
BILIRUBIN TOTAL: 0.6 mg/dL (ref 0.2–1.2)
BUN / CREAT RATIO: 16 (calc) (ref 6–22)
BUN: 23 mg/dL (ref 7–25)
CALCIUM: 9.6 mg/dL (ref 8.6–10.4)
CHLORIDE: 101 mmol/L (ref 98–110)
CO2: 28 mmol/L (ref 20–32)
Creat: 1.4 mg/dL — ABNORMAL HIGH (ref 0.60–0.93)
GFR, EST AFRICAN AMERICAN: 43 mL/min/{1.73_m2} — AB (ref >=60)
GFR, EST NON AFRICAN AMERICAN: 37 mL/min/{1.73_m2} — AB (ref >=60)
Globulin: 3 g/dL (calc) (ref 1.9–3.7)
Glucose, Bld: 111 mg/dL — ABNORMAL HIGH (ref 65–99)
Potassium: 4.1 mmol/L (ref 3.5–5.3)
Sodium: 140 mmol/L (ref 135–146)
TOTAL PROTEIN: 7.3 g/dL (ref 6.1–8.1)

## 2018-09-25 LAB — CBC WITH DIFFERENTIAL/PLATELET
Basophils Absolute: 70 cells/uL (ref 0–200)
Basophils Relative: 0.8 %
EOS PCT: 2.6 %
Eosinophils Absolute: 226 cells/uL (ref 15–500)
HEMATOCRIT: 30.9 % — AB (ref 35.0–45.0)
Hemoglobin: 10.2 g/dL — ABNORMAL LOW (ref 11.7–15.5)
LYMPHS ABS: 2097 {cells}/uL (ref 850–3900)
MCH: 28 pg (ref 27.0–33.0)
MCHC: 33 g/dL (ref 32.0–36.0)
MCV: 84.9 fL (ref 80.0–100.0)
MPV: 12.2 fL (ref 7.5–12.5)
Monocytes Relative: 6.8 %
Neutro Abs: 5716 cells/uL (ref 1500–7800)
Neutrophils Relative %: 65.7 %
Platelets: 385 10*3/uL (ref 140–400)
RBC: 3.64 10*6/uL — AB (ref 3.80–5.10)
RDW: 15.1 % — AB (ref 11.0–15.0)
Total Lymphocyte: 24.1 %
WBC: 8.7 10*3/uL (ref 3.8–10.8)
WBCMIX: 592 {cells}/uL (ref 200–950)

## 2018-09-25 LAB — MAGNESIUM: Magnesium: 1.9 mg/dL (ref 1.5–2.5)

## 2018-09-25 LAB — LIPID PANEL
CHOL/HDL RATIO: 4.7 (calc) (ref <5.0)
Cholesterol: 145 mg/dL (ref <200)
HDL: 31 mg/dL — ABNORMAL LOW (ref >50)
LDL CHOLESTEROL (CALC): 89 mg/dL
NON-HDL CHOLESTEROL (CALC): 114 mg/dL (ref <130)
Triglycerides: 148 mg/dL (ref <150)

## 2018-09-25 LAB — HEMOGLOBIN A1C
HEMOGLOBIN A1C: 5.9 %{Hb} — AB (ref <5.7)
Mean Plasma Glucose: 123 (calc)
eAG (mmol/L): 6.8 (calc)

## 2018-09-25 LAB — VITAMIN D 25 HYDROXY (VIT D DEFICIENCY, FRACTURES): VIT D 25 HYDROXY: 74 ng/mL (ref 30–100)

## 2018-09-25 LAB — TSH: TSH: 16.83 mIU/L — ABNORMAL HIGH (ref 0.40–4.50)

## 2018-09-25 LAB — URIC ACID: Uric Acid, Serum: 5.4 mg/dL (ref 2.5–7.0)

## 2018-10-19 ENCOUNTER — Other Ambulatory Visit: Payer: Self-pay | Admitting: Internal Medicine

## 2018-10-19 DIAGNOSIS — E039 Hypothyroidism, unspecified: Secondary | ICD-10-CM

## 2018-10-20 ENCOUNTER — Other Ambulatory Visit: Payer: Self-pay | Admitting: Internal Medicine

## 2018-10-20 ENCOUNTER — Other Ambulatory Visit: Payer: Self-pay | Admitting: Physician Assistant

## 2018-10-20 DIAGNOSIS — I10 Essential (primary) hypertension: Secondary | ICD-10-CM

## 2018-10-20 DIAGNOSIS — I872 Venous insufficiency (chronic) (peripheral): Secondary | ICD-10-CM

## 2018-10-20 MED ORDER — FUROSEMIDE 80 MG PO TABS: ORAL_TABLET | ORAL | 3 refills | Status: DC

## 2018-11-01 ENCOUNTER — Encounter (INDEPENDENT_AMBULATORY_CARE_PROVIDER_SITE_OTHER): Payer: Medicare Other | Admitting: Ophthalmology

## 2018-11-01 DIAGNOSIS — H34812 Central retinal vein occlusion, left eye, with macular edema: Secondary | ICD-10-CM

## 2018-11-01 DIAGNOSIS — I10 Essential (primary) hypertension: Secondary | ICD-10-CM

## 2018-11-01 DIAGNOSIS — E11311 Type 2 diabetes mellitus with unspecified diabetic retinopathy with macular edema: Secondary | ICD-10-CM

## 2018-11-01 DIAGNOSIS — E113312 Type 2 diabetes mellitus with moderate nonproliferative diabetic retinopathy with macular edema, left eye: Secondary | ICD-10-CM

## 2018-11-01 DIAGNOSIS — H35033 Hypertensive retinopathy, bilateral: Secondary | ICD-10-CM

## 2018-11-01 DIAGNOSIS — H43813 Vitreous degeneration, bilateral: Secondary | ICD-10-CM

## 2018-11-01 DIAGNOSIS — E113391 Type 2 diabetes mellitus with moderate nonproliferative diabetic retinopathy without macular edema, right eye: Secondary | ICD-10-CM

## 2018-11-29 ENCOUNTER — Other Ambulatory Visit: Payer: Self-pay | Admitting: Internal Medicine

## 2018-11-29 DIAGNOSIS — N182 Chronic kidney disease, stage 2 (mild): Principal | ICD-10-CM

## 2018-11-29 DIAGNOSIS — E1122 Type 2 diabetes mellitus with diabetic chronic kidney disease: Secondary | ICD-10-CM

## 2018-11-29 DIAGNOSIS — Z794 Long term (current) use of insulin: Principal | ICD-10-CM

## 2018-12-03 ENCOUNTER — Ambulatory Visit: Payer: Self-pay | Admitting: Physician Assistant

## 2018-12-06 NOTE — Progress Notes (Deleted)
Wellnesss Assessment and Plan:  Essential hypertension Continue medication Monitor blood pressure at home; call if consistently over 130/80 Continue DASH diet.   Reminder to go to the ER if any CP, SOB, nausea, dizziness, severe HA, changes vision/speech, left arm numbness and tingling and jaw pain.  OSA (obstructive sleep apnea) Wears CPAP inconsistently, emphasized adherence  Hyperlipidemia associated with type 2 diabetes ([place]) Currently statin deferred due to fair control off of medications Continue low cholesterol diet and exercise.  Check lipid panel.   Hypothyroidism, unspecified type continue medications the same pending lab results reminded to take on an empty stomach 30-59mns before food.  check TSH level  Type 2 diabetes mellitus with stage 2 chronic kidney disease, without long-term current use of insulin ([facility] [place] Education: Reviewed 'ABCs' of diabetes management (respective goals in parentheses):  A1C (<7), blood pressure (<130/80), and cholesterol (LDL <70) Eye Exam yearly and Dental Exam every 6 months. Dietary recommendations Physical Activity recommendations Foot exam completed   Diabetic retinopathy Control sugars, followed by ophthalmology  CKD stage 3 due to type 2 diabetes mellitus (HCC) Increase fluids, avoid NSAIDS, monitor sugars, will monitor  Venous stasis ulcer of right ankle limited to breakdown of skin without varicose veins (HCC) Resolved  Vitamin D deficiency Continue supplementation Check vitamin D level  Obesity, morbid ([place] Long discussion about weight loss, diet, and exercise Recommended diet heavy in fruits and veggies and low in animal meats, cheeses, and dairy products, appropriate calorie intake Patient will work on restart [employer] silver sneakers (water aerobics) Discussed appropriate weight for height and initial goal (240 lb) Follow up at next visit  Macular degeneration, unspecified laterality, unspecified  type Followed by ophthalmology  Hyperlipidemia Currently not treated secondary to fair control without medications Continue low cholesterol diet and exercise.  Check lipid panel.   Chronic gout without tophus, unspecified cause, unspecified site Continue allopurinol Diet discussed Check uric acid   Generalized anxiety disorder Recently well controlled; uses benzo PRN prior to eye injections Stress management techniques discussed, increase water, good sleep hygiene discussed, increase exercise, and increase veggies.   Anemia, unspecified type On iron with recent normal iron studies Check B12, folate   Discussed med's effects and SE's. Screening labs and tests as requested with regular follow-up as recommended. Over 30 minutes of exam, counseling, chart review, and complex, high level critical decision making was performed this visit.   Future Appointments  Date Time Provider [place] 12/07/2018 11:00 AM CVicie Mutters PA-C [facility] None  12/13/2018  8:00 AM MHayden Pedro MD [facility] None  04/04/2019  9:30 AM MUnk Pinto MD [facility] None  08/04/2019  9:00 AM CLiane Comber NP [facility] None    Plan:   During the course of the visit the patient was educated and counseled about appropriate screening and preventive services including:    Pneumococcal vaccine   Prevnar 13  Influenza vaccine  Td vaccine  Screening electrocardiogram  Bone densitometry screening  Colorectal cancer screening  Diabetes screening  Glaucoma screening  Nutrition counseling   Advanced directives: requested   HPI  72y.o. female  presents for a medicare wellness and follow up for has Essential hypertension; DM (diabetes mellitus), type 2 with renal complications ([place]; Vitamin D deficiency; Obesity, morbid ([place]; OSA (obstructive sleep apnea); Hypothyroidism; Medication management; Macular degeneration; Generalized anxiety disorder; Gout; Hyperlipidemia  associated with type 2 diabetes mellitus ([place]; CKD stage 3 due to type 2 diabetes mellitus ([place]; Anemia; and Diabetic retinopathy associated with type 2  diabetes mellitus ([place]) on their problem list.   she has a diagnosis of anxiety and is currently on valium 5 mg once daily PRN, reports symptoms are well controlled on current regimen. she takes the night before she goes to get eye injections, hasn't needed in several months  BMI is There is no height or weight on file to calculate BMI., she has not been working on diet, limited by R knee arthritis. She does have silver sneakers but hasn't been using. She reports she has been successful with 90# weight loss on weight watchers and is thinking about restarting. Wt Readings from Last 3 Encounters:  09/24/18 244 lb 12.8 oz (111 kg)  06/14/18 245 lb 6.4 oz (111.3 kg)  05/27/18 249 lb (112.9 kg)   Her blood pressure has been controlled at home, today their BP is   She does not workout. She denies chest pain, shortness of breath, dizziness.   She is not on cholesterol medication and denies myalgias. Her cholesterol is not at goal. The cholesterol last visit was:   Lab Results  Component Value Date   CHOL 145 09/24/2018   HDL 31 (L) 09/24/2018   LDLCALC 89 09/24/2018   TRIG 148 09/24/2018   CHOLHDL 4.7 09/24/2018   She has not been working on diet and exercise for T2DM (on metformin 4 tabs daily, novolin 70/30 - 25 units AM, 10 units PM), she is on bASA, she is on ACE/ARB and denies foot ulcerations, hyperglycemia, hypoglycemia , increased appetite, nausea, paresthesia of the feet, polydipsia, polyuria, visual disturbances, vomiting and weight loss. Last A1C in the office was:  Lab Results  Component Value Date   HGBA1C 5.9 (H) 09/24/2018   She is on thyroid medication. Her medication was not changed last visit.   Lab Results  Component Value Date   TSH 16.83 (H) 09/24/2018   Last GFR: Lab Results  Component Value Date   GFRAA 43 (L)  09/24/2018   Patient is on allopurinol for gout and does not report a recent flare.  Lab Results  Component Value Date   LABURIC 5.4 09/24/2018   Patient is on Vitamin D supplement.   Lab Results  Component Value Date   VD25OH 74 09/24/2018      Current Medications:    Current Outpatient Medications (Endocrine & Metabolic):  .  insulin NPH-regular Human (NOVOLIN 70/30) (70-30) 100 UNIT/ML injection, Inject into the skin. Patient injects 25 units in the AM and 15 units in the PM. .  levothyroxine (SYNTHROID, LEVOTHROID) 100 MCG tablet, TAKE 1 TABLET BY MOUTH ONCE DAILY IN THE MORNING ON AN EMPTY STOMACH WITH  ONLY  WATER  FOR  30  MINUTES .  metFORMIN (GLUCOPHAGE XR) 500 MG 24 hr tablet, Take 2 tablets 2 x/ day with meals for DIABETES  Current Outpatient Medications (Cardiovascular):  .  atenolol (TENORMIN) 100 MG tablet, TAKE ONE TABLET BY MOUTH ONCE DAILY (Patient taking differently: TAKE ONE/HALF TABLET BY MOUTH ONCE DAILY) .  enalapril (VASOTEC) 20 MG tablet, TAKE 1 TABLET BY MOUTH ONCE DAILY FOR BLOOD PRESSURE .  furosemide (LASIX) 80 MG tablet, Take 1 tablet 2 x /day for Fluid & Swelling .  metolazone (ZAROXOLYN) 5 MG tablet, Takle 1 tablet daily with Lasix for Fluid Retention % swelling (Patient not taking: Reported on 09/24/2018)   Current Outpatient Medications (Analgesics):  .  allopurinol (ZYLOPRIM) 300 MG tablet, TAKE 1 TABLET BY MOUTH ONCE DAILY .  aspirin EC 81 MG tablet, Take 81 mg  by mouth 2 (two) times daily. .  colchicine 0.6 MG tablet, Take 1 tablet (0.6 mg total) by mouth daily. .  meloxicam (MOBIC) 15 MG tablet, Take 1 tablet (15 mg total) by mouth daily as needed for pain.  Current Outpatient Medications (Hematological):  .  ferrous fumarate (HEMOCYTE - 106 MG FE) 325 (106 Fe) MG TABS tablet, Take 325 mg of iron by mouth daily.   Current Outpatient Medications (Other):  Marland Kitchen  BESIVANCE 0.6 % SUSP, Place 1 drop into the left eye 4 (four) times daily.  .  Blood  Glucose Monitoring Suppl (ONE TOUCH ULTRA 2) w/Device KIT, check blood sugar 3 times daily-DX-E10.9 .  Cholecalciferol (VITAMIN D3) 5000 units CAPS, Take 10,000 Units by mouth daily. .  diazepam (VALIUM) 5 MG tablet, TAKE ONE TABLET BY MOUTH AT BEDTIME AS NEEDED FOR ANXIETY .  glucose blood (ONE TOUCH ULTRA TEST) test strip, CHECK BLOOD SUGAR 3 TIMES A DAY .  hyoscyamine (LEVSIN, ANASPAZ) 0.125 MG tablet, Take 1 tablet (0.125 mg total) by mouth every 6 (six) hours as needed. .  Lancets (ONETOUCH ULTRASOFT) lancets, Use as instructed Dx. Code: E11.22 .  Lancets (ONETOUCH ULTRASOFT) lancets, Check blood sugar 3 times daily-DX-E10.9 .  OVER THE COUNTER MEDICATION, Take 1 tablet by mouth daily. Tumeric with Black Pepper .  potassium chloride (K-DUR) 10 MEQ tablet, Take 1 tablet daily .  prednisoLONE acetate (PRED FORTE) 1 % ophthalmic suspension, INSTILL 1 DROP INTO EACH EYE TWICE DAILY  Allergies:  Allergies  Allergen Reactions  . Penicillins Swelling  . Sulfa Antibiotics Itching and Rash   Medical History:  She has Essential hypertension; DM (diabetes mellitus), type 2 with renal complications ([place]); Vitamin D deficiency; Obesity, morbid ([place]); OSA (obstructive sleep apnea); Hypothyroidism; Medication management; Macular degeneration; Generalized anxiety disorder; Gout; Hyperlipidemia associated with type 2 diabetes mellitus ([place]); CKD stage 3 due to type 2 diabetes mellitus ([place]); Anemia; and Diabetic retinopathy associated with type 2 diabetes mellitus ([place]) on their problem list. Health Maintenance:   Immunization History  Administered Date(s) Administered  . DT 06/19/2015  . DTaP 12/09/2004  . Pneumococcal Conjugate-13 12/19/2014  . Pneumococcal Polysaccharide-23 03/02/2009   Last colonoscopy: 03/2014 normal Last mammogram: 04/2018, CAT A Last pap smear/pelvic exam: 2012 remote DEXA: 11/2015 normal Carotid US 2013 CXR 2009 Echo 2019- good EF  Prior vaccinations: TD or Tdap:  2016 Influenza: declines Pneumococcal: 2010 declines Prevnar 13: 2015 Shingles/Zostavax: declines  Eye exam Dr. Zigmund Daniel 03/29/2018 Report received and abstracted Dental: Dr. Delma Freeze, last exam a few years ago, will schedule  Patient Care Team: [name], MD as PCP - General (Internal Medicine) Warden Fillers, MD as Consulting Physician (Optometry) [name], MD as Consulting Physician (Ophthalmology) Inda Castle, MD (Inactive) as Consulting Physician (Gastroenterology) Druscilla Brownie, MD as Consulting Physician (Dermatology) Allyn Kenner, MD (Dermatology)  Surgical History:  She has a past surgical history that includes  eye procedure; Tubal ligation; and Dental examination under anesthesia. Family History:  Herfamily history includes Breast cancer in her mother; Diabetes in her maternal grandmother and sister; Hypertension in her sister; Renal Disease in her mother. Social History:  She reports that she has never smoked. She has never used smokeless tobacco. She reports current alcohol use. She reports that she does not use drugs.  MEDICARE WELLNESS OBJECTIVES: Physical activity:   Cardiac risk factors:   Depression/mood screen:   Depression screen [facility] 2/9 09/25/2018  Decreased Interest 0  Down, Depressed, Hopeless 0  PHQ - 2  Score 0    ADLs:  In your present state of health, do you have any difficulty performing the following activities: 09/25/2018 04/11/2018  Hearing? N N  Vision? N N  Difficulty concentrating or making decisions? N N  Walking or climbing stairs? N N  Dressing or bathing? N N  Doing errands, shopping? N N  Some recent data might be hidden     Cognitive Testing  Alert? Yes  Normal Appearance?Yes  Oriented to person? Yes  Place? Yes   Time? Yes  Recall of three objects?  Yes  Can perform simple calculations? Yes  Displays appropriate judgment?Yes  Can read the correct time from a watch face?Yes  EOL planning:       Review of Systems: Review of Systems  Constitutional: Negative for malaise/fatigue and weight loss.  HENT: Negative for hearing loss and tinnitus.   Eyes: Negative for blurred vision and double vision.  Respiratory: Negative for cough, sputum production, shortness of breath and wheezing.   Cardiovascular: Negative for chest pain, palpitations, orthopnea, claudication, leg swelling and PND.  Gastrointestinal: Negative for abdominal pain, blood in stool, constipation, diarrhea, heartburn, melena, nausea and vomiting.  Genitourinary: Negative.   Musculoskeletal: Negative for falls, joint pain and myalgias.  Skin: Negative for rash.  Neurological: Negative for dizziness, tingling, sensory change, weakness and headaches.  Endo/Heme/Allergies: Negative for polydipsia.  Psychiatric/Behavioral: Negative.  Negative for depression, memory loss, substance abuse and suicidal ideas. The patient is not nervous/anxious and does not have insomnia.   All other systems reviewed and are negative.   Physical Exam: Estimated body mass index is 44.77 kg/m as calculated from the following:   Height as of 09/24/18: _0  (1.575 m).   Weight as of 09/24/18: 244 lb 12.8 oz (111 kg). There were no vitals taken for this visit. General Appearance: Well nourished, in no apparent distress.  Eyes: PERRLA, EOMs, conjunctiva no swelling or erythema, normal fundi and vessels.  Sinuses: No Frontal/maxillary tenderness  ENT/Mouth: Ext aud canals clear, normal light reflex with TMs without erythema, bulging. Good dentition. No erythema, swelling, or exudate on post pharynx. Tonsils not swollen or erythematous. Hearing normal.  Neck: Supple, thyroid normal. No bruits  Respiratory: Respiratory effort normal, BS equal bilaterally without rales, rhonchi, wheezing or stridor.  Cardio: RRR withou, rubs or gallops, does have 4/6 systolic blowing murmur. Symmetrical peripheral pulses with mild non-pitting edema of feet and  ankles.  Chest: symmetric, with normal excursions and percussion.  Breasts: Symmetric, without lumps, nipple discharge, retractions.  Abdomen: Soft, nontender, no guarding, rebound, hernias, masses, or organomegaly.  Lymphatics: Non tender without lymphadenopathy.  Genitourinary: Defer Musculoskeletal: Full ROM all peripheral extremities,5/5 strength, and normal gait.  Skin: Warm, dry without rashes, lesions, ecchymosis. Neuro: Cranial nerves intact, reflexes equal bilaterally. Normal muscle tone, no cerebellar symptoms. Sensation intact.  Psych: Awake and oriented X 3, normal affect, Insight and Judgment appropriate.    Medicare Attestation I have personally reviewed: The patient's medical and social history Their use of alcohol, tobacco or illicit drugs Their current medications and supplements The patient's functional ability including ADLs,fall risks, home safety risks, cognitive, and hearing and visual impairment Diet and physical activities Evidence for depression or mood disorders  The patient's weight, height, BMI, and visual acuity have been recorded in the chart.  I have made referrals, counseling, and provided education to the patient based on review of the above and I have provided the patient with a written personalized care plan for preventive services.  Vicie Mutters 12:26 PM [facility]

## 2018-12-07 ENCOUNTER — Ambulatory Visit: Payer: Self-pay | Admitting: Physician Assistant

## 2018-12-09 NOTE — Progress Notes (Signed)
MEDICARE WELLNESS   Assessment and Plan:  Encounter for Medicare annual wellness exam 1 year  Essential hypertension - continue medications, DASH diet, exercise and monitor at home. Call if greater than 130/80.  WILL BRING BACK NEW BP CUFF AND DO NURSE VISIT TO RECHECK BP - CBC with Differential - CMP/GFR  OSA (obstructive sleep apnea) Weight loss advised, needs to wear nightly, may benefit from new mask/titration   Hypothyroidism, unspecified hypothyroidism type Hypothyroidism-check TSH level, continue medications the same, reminded to take on an empty stomach 30-74mns before food.  - TSH   Hyperlipidemia Start rosuvastatin 5 mg once weekly for mild hyperlipidemia and for T2DM recommendations - check lipids, decrease fatty foods, increase activity.  - Lipid panel   Vitamin D deficiency - Vit D  25 hydroxy (rtn osteoporosis monitoring)   Obesity, morbid Obesity with co morbidities- long discussion about weight loss, diet, and exercise  Medication management - Magnesium  CKD stage 2 due to type 2 diabetes mellitus Discussed general issues about diabetes pathophysiology and management., Educational material distributed., Suggested low cholesterol diet., Encouraged aerobic exercise., Discussed foot care., Reminded to get yearly retinal exam. - Hemoglobin A1c  Anxiety Will take valium prior to procedures but would like to be able to take AS needed for sleep, long discussion about addictive nature and tolerance, will only take PRN.   Macular degeneration, unspecified laterality, unspecified type  continue follow up eye doctor  Chronic gout without tophus, unspecified cause, unspecified site Gout- recheck Uric acid as needed, Diet discussed, continue medications.  BMI 45.0-49.9, adult (HMaurice - follow up 3 months for progress monitoring - increase veggies, decrease carbs - long discussion about weight loss, diet, and exercise  Type 2 diabetes mellitus with hyperlipidemia  (HSallis Discussed general issues about diabetes pathophysiology and management., Educational material distributed., Suggested low cholesterol diet., Encouraged aerobic exercise., Discussed foot care., Reminded to get yearly retinal exam. -     Hemoglobin A1c   Discussed med's effects and SE's. Screening labs and tests as requested with regular follow-up as recommended. Future Appointments  Date Time Provider [place] 12/13/2018  8:00 AM MHayden Pedro MD [facility] None  04/04/2019  9:30 AM MUnk Pinto MD [facility] None  08/04/2019  9:00 AM CLiane Comber NP [facility] None    Plan:   During the course of the visit the patient was educated and counseled about appropriate screening and preventive services including:    Pneumococcal vaccine   Prevnar 13  Influenza vaccine  Td vaccine  Screening electrocardiogram  Bone densitometry screening  Colorectal cancer screening  Diabetes screening  Glaucoma screening  Nutrition counseling   Advanced directives: requested   HPI  72y.o. AA female  presents for medicare wellness and 3 month follow up  She reports perineal itching without vaginal discharge or urinary sypmptoms x 3 weeks; reports last time she had this turned out to be a UTI, She is married but hasn't been sexually active in nearly a year. No hx of STIs.   She is getting shots for Mac Degen with Dr. MZigmund Daniel she is prescribed valium to take prior to eye injections.   She has bilateral knee pain, R knee worse than left, currently doing NSAID therapy with meloxicam which she reports is helpful. Discussed knee injections next if needed.   She has OSA and does CPAP though admits to using sporadically/infrequently, admits that she only wears about 1 night per month. She admits the unit is currently by her bed, and  not sleeping much in bed anymore (sleeping on sofa) and consequently forgets. She reports no problems/discomfort when she does wear. She does  endorse improved mental clarity after wearing CPAP.   BMI is Body mass index is 45.36 kg/m., she has been working on diet, reducing white items/starches, and sweets.  Wt Readings from Last 3 Encounters:  12/10/18 248 lb (112.5 kg)  09/24/18 244 lb 12.8 oz (111 kg)  06/14/18 245 lb 6.4 oz (111.3 kg)   Her blood pressure has been controlled at home, today their BP is BP: 130/74 She does not workout due to knee pain.  She denies chest pain, shortness of breath, dizziness.   She is on cholesterol medication and denies myalgias. Her cholesterol is at goal. The cholesterol last visit was:   Lab Results  Component Value Date   CHOL 145 09/24/2018   HDL 31 (L) 09/24/2018   LDLCALC 89 09/24/2018   TRIG 148 09/24/2018   CHOLHDL 4.7 09/24/2018   She has had diabetes for 3 years. She has been working on diet and exercise for Diabetes with CKD stage III, she is on ACE, she is on bASA, She is on metformin, she is on 70/30 insulin 25 units and 10 units and denies paresthesia of the feet, polydipsia and polyuria. She is checking her sugars, fasting ranging from 84- 120s. Checks sugars twice a day with one touch ultra.  Denies any hypoglycemia. Last A1C in the office was:  Lab Results  Component Value Date   HGBA1C 5.9 (H) 09/24/2018   Lab Results  Component Value Date   GFRAA 43 (L) 09/24/2018   Patient is on Vitamin D supplement.   Lab Results  Component Value Date   VD25OH 74 09/24/2018     She is on thyroid medication. Her medication was changed last visit. She is on 100 mcg daily. Lab Results  Component Value Date   TSH 16.83 (H) 09/24/2018   Patient is on allopurinol for gout and does not report a recent flare.  Lab Results  Component Value Date   LABURIC 5.4 09/24/2018     Current Medications:  Current Outpatient Medications on File Prior to Visit  Medication Sig Dispense Refill  . allopurinol (ZYLOPRIM) 300 MG tablet TAKE 1 TABLET BY MOUTH ONCE DAILY 90 tablet 1  . aspirin  EC 81 MG tablet Take 81 mg by mouth 2 (two) times daily.    Marland Kitchen atenolol (TENORMIN) 100 MG tablet TAKE ONE TABLET BY MOUTH ONCE DAILY (Patient taking differently: TAKE ONE/HALF TABLET BY MOUTH ONCE DAILY) 90 tablet 1  . BESIVANCE 0.6 % SUSP Place 1 drop into the left eye 4 (four) times daily.     . Blood Glucose Monitoring Suppl (ONE TOUCH ULTRA 2) w/Device KIT check blood sugar 3 times daily-DX-E10.9 1 each 0  . Cholecalciferol (VITAMIN D3) 5000 units CAPS Take 10,000 Units by mouth daily.    . colchicine 0.6 MG tablet Take 1 tablet (0.6 mg total) by mouth daily. 30 tablet 2  . diazepam (VALIUM) 5 MG tablet TAKE ONE TABLET BY MOUTH AT BEDTIME AS NEEDED FOR ANXIETY 30 tablet 1  . enalapril (VASOTEC) 20 MG tablet TAKE 1 TABLET BY MOUTH ONCE DAILY FOR BLOOD PRESSURE 90 tablet 1  . ferrous fumarate (HEMOCYTE - 106 MG FE) 325 (106 Fe) MG TABS tablet Take 325 mg of iron by mouth daily.     . furosemide (LASIX) 80 MG tablet Take 1 tablet 2 x /day for Fluid & Swelling  180 tablet 3  . glucose blood (ONE TOUCH ULTRA TEST) test strip CHECK BLOOD SUGAR 3 TIMES A DAY 300 each 3  . hyoscyamine (LEVSIN, ANASPAZ) 0.125 MG tablet Take 1 tablet (0.125 mg total) by mouth every 6 (six) hours as needed. 60 tablet 1  . insulin NPH-regular Human (NOVOLIN 70/30) (70-30) 100 UNIT/ML injection Inject into the skin. Patient injects 25 units in the AM and 15 units in the PM.    . Lancets (ONETOUCH ULTRASOFT) lancets Use as instructed Dx. Code: E11.22 100 each 12  . Lancets (ONETOUCH ULTRASOFT) lancets Check blood sugar 3 times daily-DX-E10.9 300 each 1  . levothyroxine (SYNTHROID, LEVOTHROID) 100 MCG tablet TAKE 1 TABLET BY MOUTH ONCE DAILY IN THE MORNING ON AN EMPTY STOMACH WITH  ONLY  WATER  FOR  30  MINUTES 90 tablet 1  . meloxicam (MOBIC) 15 MG tablet Take 1 tablet (15 mg total) by mouth daily as needed for pain. 30 tablet 3  . metolazone (ZAROXOLYN) 5 MG tablet Takle 1 tablet daily with Lasix for Fluid Retention %  swelling 30 tablet 3  . OVER THE COUNTER MEDICATION Take 1 tablet by mouth daily. Tumeric with Black Pepper    . potassium chloride (K-DUR) 10 MEQ tablet Take 1 tablet daily 90 tablet 3  . prednisoLONE acetate (PRED FORTE) 1 % ophthalmic suspension INSTILL 1 DROP INTO EACH EYE TWICE DAILY 5 mL 0  . metFORMIN (GLUCOPHAGE XR) 500 MG 24 hr tablet Take 2 tablets 2 x/ day with meals for DIABETES 360 tablet 1   No current facility-administered medications on file prior to visit.    Health Maintenance:   Immunization History  Administered Date(s) Administered  . DT 06/19/2015  . DTaP 12/09/2004  . Pneumococcal Conjugate-13 12/19/2014  . Pneumococcal Polysaccharide-23 03/02/2009   Last colonoscopy: 03/2014 normal Last mammogram: 04/2018, CAT A Last pap smear/pelvic exam: 2012, DONE DEXA: 11/2015 normal Carotid US 2013 CXR 2009  Prior vaccinations: TD or Tdap: 2016 Influenza: declines Pneumococcal: 2010 DUE Prevnar 13: 2015 Shingles/Zostavax: declines  Eye exam Dr. Zigmund Daniel 03/29/2018 Report received and abstracted Dental: Dr. Delma Freeze, last exam a few years ago, will schedule   Patient Care Team: [name], MD as PCP - General (Internal Medicine) Warden Fillers, MD as Consulting Physician (Optometry) [name], MD as Consulting Physician (Ophthalmology) Inda Castle, MD (Inactive) as Consulting Physician (Gastroenterology) Druscilla Brownie, MD as Consulting Physician (Dermatology) Allyn Kenner, MD (Dermatology)   Medical History:  Past Medical History:  Diagnosis Date  . Allergic rhinitis   . Allergy   . Anxiety    with medical procedures  . Hyperlipidemia   . Hypertension   . Obesity   . OSA (obstructive sleep apnea)    wears CPAP  . Type II or unspecified type diabetes mellitus with renal manifestations, not stated as uncontrolled(250.40)   . Unspecified hypothyroidism   . Vitamin D deficiency    Allergies Allergies  Allergen Reactions  .  Penicillins Swelling  . Sulfa Antibiotics Itching and Rash    SURGICAL HISTORY She  has a past surgical history that includes  eye procedure; Tubal ligation; and Dental examination under anesthesia. FAMILY HISTORY Her family history includes Breast cancer in her mother; Diabetes in her maternal grandmother and sister; Hypertension in her sister; Renal Disease in her mother. SOCIAL HISTORY She  reports that she has never smoked. She has never used smokeless tobacco. She reports current alcohol use. She reports that she does not use drugs.  MEDICARE WELLNESS OBJECTIVES: Physical activity: Current Exercise Habits: The patient does not participate in regular exercise at present, Exercise limited by: orthopedic condition(s) Cardiac risk factors: Cardiac Risk Factors include: dyslipidemia;hypertension;advanced age (>24mn, >>27women);diabetes mellitus;obesity (BMI >30kg/m2);sedentary lifestyle Depression/mood screen:   Depression screen [facility] 12/10/2018  Decreased Interest 0  Down, Depressed, Hopeless 0  PHQ - 2 Score 0    ADLs:  In your present state of health, do you have any difficulty performing the following activities: 12/10/2018 09/25/2018  Hearing? N N  Vision? Y N  Comment getting injections for macular degeneration  -  Difficulty concentrating or making decisions? N N  Walking or climbing stairs? N N  Comment some mild pain in knees, but can manage -  Dressing or bathing? N N  Doing errands, shopping? N N  Some recent data might be hidden     Cognitive Testing  Alert? Yes  Normal Appearance?Yes  Oriented to person? Yes  Place? Yes   Time? Yes  Recall of three objects?  Yes  Can perform simple calculations? Yes  Displays appropriate judgment?Yes  Can read the correct time from a watch face?Yes  EOL planning: Does Patient Have a Medical Advance Directive?: No Would patient like information on creating a medical advance directive?: Yes (MAU/Ambulatory/Procedural Areas -  Information given)    Review of Systems  Constitutional: Negative.   HENT: Negative.   Eyes: Negative.   Respiratory: Negative.   Cardiovascular: Negative.   Gastrointestinal: Negative.   Genitourinary: Negative for dysuria, flank pain, frequency, hematuria and urgency.  Musculoskeletal: Positive for joint pain (bilateral knee pain).  Skin: Negative.   Neurological: Negative.   Endo/Heme/Allergies: Negative.   Psychiatric/Behavioral: Negative for depression, hallucinations, memory loss, substance abuse and suicidal ideas. The patient is not nervous/anxious and does not have insomnia.     Physical Exam: Estimated body mass index is 45.36 kg/m as calculated from the following:   Height as of this encounter: 5' 2"  (1.575 m).   Weight as of this encounter: 248 lb (112.5 kg). BP 130/74   Pulse 72   Temp 97.7 F (36.5 C)   Ht 5' 2"  (1.575 m)   Wt 248 lb (112.5 kg)   SpO2 99%   BMI 45.36 kg/m  General Appearance: Well nourished, in no apparent distress.  Eyes: PERRLA, EOMs, conjunctiva no swelling or erythema, normal fundi and vessels.  Sinuses: No Frontal/maxillary tenderness  ENT/Mouth: Ext aud canals clear, normal light reflex with TMs without erythema, bulging. Good dentition. No erythema, swelling, or exudate on post pharynx. Tonsils not swollen or erythematous. Hearing normal.  Neck: Supple, thyroid normal. No bruits  Respiratory: Respiratory effort normal, BS equal bilaterally without rales, rhonchi, wheezing or stridor.  Cardio: RRR with 2/6 systolic murmurs RSB, without rubs or gallops. Brisk peripheral pulses with mild edema.  Chest: symmetric, with normal excursions and percussion.  Breasts: declines, states getting MGM Abdomen: Soft, nontender, no guarding, rebound, hernias, masses, or organomegaly. .  Lymphatics: Non tender without lymphadenopathy.  Genitourinary: defer Musculoskeletal: Full ROM all peripheral extremities,5/5 strength, and normal gait.  Skin:  Vitiligo bilateral hands and feet. Warm, dry without rashes, lesions, ecchymosis. Neuro: Cranial nerves intact, reflexes equal bilaterally. Normal muscle tone, no cerebellar symptoms. Sensation intact.  Psych: Awake and oriented X 3, normal affect, Insight and Judgment appropriate.    Medicare Attestation I have personally reviewed: The patient's medical and social history Their use of alcohol, tobacco or illicit drugs Their current medications and supplements The patient's  functional ability including ADLs,fall risks, home safety risks, cognitive, and hearing and visual impairment Diet and physical activities Evidence for depression or mood disorders  The patient's weight, height, BMI, and visual acuity have been recorded in the chart.  I have made referrals, counseling, and provided education to the patient based on review of the above and I have provided the patient with a written personalized care plan for preventive services.      Gorden Harms Germani Gavilanes 11:32 AM [facility]

## 2018-12-10 ENCOUNTER — Ambulatory Visit (INDEPENDENT_AMBULATORY_CARE_PROVIDER_SITE_OTHER): Payer: Medicare Other | Admitting: Adult Health

## 2018-12-10 ENCOUNTER — Encounter: Payer: Self-pay | Admitting: Adult Health

## 2018-12-10 VITALS — BP 130/74 | HR 72 | Temp 97.7°F | Ht 62.0 in | Wt 248.0 lb

## 2018-12-10 DIAGNOSIS — E039 Hypothyroidism, unspecified: Secondary | ICD-10-CM

## 2018-12-10 DIAGNOSIS — D649 Anemia, unspecified: Secondary | ICD-10-CM

## 2018-12-10 DIAGNOSIS — E559 Vitamin D deficiency, unspecified: Secondary | ICD-10-CM

## 2018-12-10 DIAGNOSIS — E1122 Type 2 diabetes mellitus with diabetic chronic kidney disease: Secondary | ICD-10-CM | POA: Diagnosis not present

## 2018-12-10 DIAGNOSIS — F411 Generalized anxiety disorder: Secondary | ICD-10-CM

## 2018-12-10 DIAGNOSIS — I10 Essential (primary) hypertension: Secondary | ICD-10-CM

## 2018-12-10 DIAGNOSIS — R6889 Other general symptoms and signs: Secondary | ICD-10-CM | POA: Diagnosis not present

## 2018-12-10 DIAGNOSIS — G4733 Obstructive sleep apnea (adult) (pediatric): Secondary | ICD-10-CM

## 2018-12-10 DIAGNOSIS — E11319 Type 2 diabetes mellitus with unspecified diabetic retinopathy without macular edema: Secondary | ICD-10-CM

## 2018-12-10 DIAGNOSIS — E1169 Type 2 diabetes mellitus with other specified complication: Secondary | ICD-10-CM | POA: Diagnosis not present

## 2018-12-10 DIAGNOSIS — Z79899 Other long term (current) drug therapy: Secondary | ICD-10-CM

## 2018-12-10 DIAGNOSIS — N182 Chronic kidney disease, stage 2 (mild): Secondary | ICD-10-CM

## 2018-12-10 DIAGNOSIS — Z0001 Encounter for general adult medical examination with abnormal findings: Secondary | ICD-10-CM

## 2018-12-10 DIAGNOSIS — L293 Anogenital pruritus, unspecified: Secondary | ICD-10-CM

## 2018-12-10 DIAGNOSIS — M1A9XX0 Chronic gout, unspecified, without tophus (tophi): Secondary | ICD-10-CM

## 2018-12-10 DIAGNOSIS — N183 Chronic kidney disease, stage 3 unspecified: Secondary | ICD-10-CM

## 2018-12-10 DIAGNOSIS — E785 Hyperlipidemia, unspecified: Secondary | ICD-10-CM

## 2018-12-10 DIAGNOSIS — Z0000 Encounter for general adult medical examination without abnormal findings: Secondary | ICD-10-CM

## 2018-12-10 DIAGNOSIS — H3530 Unspecified macular degeneration: Secondary | ICD-10-CM

## 2018-12-10 MED ORDER — ROSUVASTATIN CALCIUM 5 MG PO TABS: ORAL_TABLET | ORAL | 1 refills | Status: DC

## 2018-12-10 NOTE — Patient Instructions (Signed)
  Jocelyn Camacho , Thank you for taking time to come for your Medicare Wellness Visit. I appreciate your ongoing commitment to your health goals. Please review the following plan we discussed and let me know if I can assist you in the future.   These are the goals we discussed: Goals    . Exercise 3x per week (30 min per time)    . Weight (lb) < 240 lb (108.9 kg)       This is a list of the screening recommended for you and due dates:  Health Maintenance  Topic Date Due  . Pneumonia vaccines (2 of 2 - PPSV23) 12/20/2018*  . Flu Shot  03/22/2019*  . Hemoglobin A1C  03/26/2019  . Eye exam for diabetics  03/30/2019  . Complete foot exam   06/15/2019  . Mammogram  04/21/2020  . Tetanus Vaccine  02/27/2024  . Colon Cancer Screening  04/17/2024  . DEXA scan (bone density measurement)  Completed  .  Hepatitis C: One time screening is recommended by Center for Disease Control  (CDC) for  adults born from [birth year] through 1965.   Completed  *Topic was postponed. The date shown is not the original due date.     Recommend focusing on muscle building, and increasing vegetables, lean protein   Please weigh once weekly   Aim for 7+ servings of fruits and vegetables daily  65-80+ fluid ounces of water or unsweet tea for healthy kidneys  Limit to max 1 drink of alcohol per day; avoid smoking/tobacco  Limit animal fats in diet for cholesterol and heart health - choose grass fed whenever available  Avoid highly processed foods, and foods high in saturated/trans fats  Aim for low stress - take time to unwind and care for your mental health  Aim for 150 min of moderate intensity exercise weekly for heart health, and weights twice weekly for bone health  Aim for 7-9 hours of sleep daily

## 2018-12-13 ENCOUNTER — Other Ambulatory Visit: Payer: Self-pay | Admitting: Adult Health

## 2018-12-13 ENCOUNTER — Encounter (INDEPENDENT_AMBULATORY_CARE_PROVIDER_SITE_OTHER): Payer: Medicare Other | Admitting: Ophthalmology

## 2018-12-13 DIAGNOSIS — H35033 Hypertensive retinopathy, bilateral: Secondary | ICD-10-CM

## 2018-12-13 DIAGNOSIS — E11311 Type 2 diabetes mellitus with unspecified diabetic retinopathy with macular edema: Secondary | ICD-10-CM

## 2018-12-13 DIAGNOSIS — H43813 Vitreous degeneration, bilateral: Secondary | ICD-10-CM

## 2018-12-13 DIAGNOSIS — E039 Hypothyroidism, unspecified: Secondary | ICD-10-CM

## 2018-12-13 DIAGNOSIS — H34812 Central retinal vein occlusion, left eye, with macular edema: Secondary | ICD-10-CM | POA: Diagnosis not present

## 2018-12-13 DIAGNOSIS — I10 Essential (primary) hypertension: Secondary | ICD-10-CM | POA: Diagnosis not present

## 2018-12-13 DIAGNOSIS — E113391 Type 2 diabetes mellitus with moderate nonproliferative diabetic retinopathy without macular edema, right eye: Secondary | ICD-10-CM

## 2018-12-13 DIAGNOSIS — E113312 Type 2 diabetes mellitus with moderate nonproliferative diabetic retinopathy with macular edema, left eye: Secondary | ICD-10-CM

## 2018-12-13 LAB — CBC WITH DIFFERENTIAL/PLATELET
Absolute Monocytes: 658 cells/uL (ref 200–950)
Basophils Absolute: 75 cells/uL (ref 0–200)
Basophils Relative: 0.8 %
Eosinophils Absolute: 197 cells/uL (ref 15–500)
Eosinophils Relative: 2.1 %
HEMATOCRIT: 31.1 % — AB (ref 35.0–45.0)
Hemoglobin: 10.3 g/dL — ABNORMAL LOW (ref 11.7–15.5)
LYMPHS ABS: 2209 {cells}/uL (ref 850–3900)
MCH: 28.9 pg (ref 27.0–33.0)
MCHC: 33.1 g/dL (ref 32.0–36.0)
MCV: 87.4 fL (ref 80.0–100.0)
MPV: 13.1 fL — ABNORMAL HIGH (ref 7.5–12.5)
Monocytes Relative: 7 %
NEUTROS PCT: 66.6 %
Neutro Abs: 6260 cells/uL (ref 1500–7800)
Platelets: 437 10*3/uL — ABNORMAL HIGH (ref 140–400)
RBC: 3.56 10*6/uL — AB (ref 3.80–5.10)
RDW: 15 % (ref 11.0–15.0)
Total Lymphocyte: 23.5 %
WBC: 9.4 10*3/uL (ref 3.8–10.8)

## 2018-12-13 LAB — URINE CULTURE
MICRO NUMBER:: 91531140
SPECIMEN QUALITY:: ADEQUATE

## 2018-12-13 LAB — LIPID PANEL
Cholesterol: 132 mg/dL (ref <200)
HDL: 31 mg/dL — ABNORMAL LOW (ref >50)
LDL CHOLESTEROL (CALC): 81 mg/dL
Non-HDL Cholesterol (Calc): 101 mg/dL (calc) (ref <130)
Total CHOL/HDL Ratio: 4.3 (calc) (ref <5.0)
Triglycerides: 104 mg/dL (ref <150)

## 2018-12-13 LAB — URINALYSIS W MICROSCOPIC + REFLEX CULTURE
BILIRUBIN URINE: NEGATIVE
Glucose, UA: NEGATIVE
Hgb urine dipstick: NEGATIVE
Hyaline Cast: NONE SEEN /LPF
KETONES UR: NEGATIVE
NITRITES URINE, INITIAL: POSITIVE — AB
PROTEIN: NEGATIVE
RBC / HPF: NONE SEEN /HPF (ref 0–2)
SQUAMOUS EPITHELIAL / LPF: NONE SEEN /HPF (ref <=5)
Specific Gravity, Urine: 1.008 (ref 1.001–1.03)
pH: <=5 (ref 5.0–8.0)

## 2018-12-13 LAB — MAGNESIUM: Magnesium: 2.2 mg/dL (ref 1.5–2.5)

## 2018-12-13 LAB — COMPLETE METABOLIC PANEL WITH GFR
AG RATIO: 1.3 (calc) (ref 1.0–2.5)
ALBUMIN MSPROF: 4.3 g/dL (ref 3.6–5.1)
ALKALINE PHOSPHATASE (APISO): 79 U/L (ref 33–130)
ALT: 13 U/L (ref 6–29)
AST: 17 U/L (ref 10–35)
BILIRUBIN TOTAL: 0.6 mg/dL (ref 0.2–1.2)
BUN / CREAT RATIO: 19 (calc) (ref 6–22)
BUN: 30 mg/dL — ABNORMAL HIGH (ref 7–25)
CHLORIDE: 105 mmol/L (ref 98–110)
CO2: 30 mmol/L (ref 20–32)
CREATININE: 1.59 mg/dL — AB (ref 0.60–0.93)
Calcium: 9.7 mg/dL (ref 8.6–10.4)
GFR, Est African American: 37 mL/min/{1.73_m2} — ABNORMAL LOW (ref >=60)
GFR, Est Non African American: 32 mL/min/{1.73_m2} — ABNORMAL LOW (ref >=60)
GLOBULIN: 3.2 g/dL (ref 1.9–3.7)
Glucose, Bld: 96 mg/dL (ref 65–99)
POTASSIUM: 4.9 mmol/L (ref 3.5–5.3)
SODIUM: 143 mmol/L (ref 135–146)
Total Protein: 7.5 g/dL (ref 6.1–8.1)

## 2018-12-13 LAB — TSH: TSH: 9.12 mIU/L — ABNORMAL HIGH (ref 0.40–4.50)

## 2018-12-13 LAB — HEMOGLOBIN A1C
EAG (MMOL/L): 6 (calc)
HEMOGLOBIN A1C: 5.4 %{Hb} (ref <5.7)
MEAN PLASMA GLUCOSE: 108 (calc)

## 2018-12-13 LAB — CULTURE INDICATED

## 2018-12-13 MED ORDER — LEVOTHYROXINE SODIUM 100 MCG PO TABS: ORAL_TABLET | ORAL | 1 refills | Status: DC

## 2018-12-13 MED ORDER — CIPROFLOXACIN HCL 250 MG PO TABS: 250.0000 mg | ORAL_TABLET | Freq: Two times a day (BID) | ORAL | 0 refills | Status: DC

## 2019-01-03 NOTE — Progress Notes (Signed)
Assessment and Plan:  Jocelyn Camacho was seen today for follow-up.  Diagnoses and all orders for this visit:  Essential hypertension Well controlled, reduce lasix 80 from BID to once daily, continue other medications as prescribed Monitor blood pressure at home; call if consistently over 130/80 Continue DASH diet.   Reminder to go to the ER if any CP, SOB, nausea, dizziness, severe HA, changes vision/speech, left arm numbness and tingling and jaw pain.  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  CKD stage 4 due to type 2 diabetes mellitus ([place]) Declining over the past year, will back down on diuretics and monitor closely Plan to refer to nephrology if not significantly improved by next visit Follow up in 3-4 weeks -     PTH, intact and calcium -     BASIC METABOLIC PANEL WITH GFR  Further disposition pending results of labs. Discussed med's effects and SE's.   Over 30 minutes of exam, counseling, chart review, and critical decision making was performed.   Future Appointments  Date Time Provider [place]  01/25/2019  8:15 AM [name], MD [facility] None  04/04/2019  9:30 AM [name], MD [facility] None  08/04/2019  9:00 AM [name], NP [facility] None  12/21/2019 11:15 AM [name], NP [facility] None    ------------------------------------------------------------------------------------------------------------------   HPI BP 110/68   Pulse 76   Temp 97.9 F (36.6 C)   Ht 5' 2" (1.575 m)   Wt 248 lb 9.6 oz (112.8 kg)   SpO2 97%   BMI 45.47 kg/m   73 y.o.female, AA, morbidly obese with htn, T2DM, CKD3 presents for follow up after we noted creatinine treanding up and GFRs  trending down over the past year.  She is prescribed lasix 80 mg BID and metalazone 5 mg for bilateral persistent edema, recommended she stop metalazone, metformin D/C'd, and presents today for 3 week follow up. She reports she stopped medications as instructed, continues to wear compression hose and elevated extremities, hasn't noted any changes in LE edema. BPs have reportedly been typical for her, and denies CP, dyspnea, cough. She reports home glucose values have remained typical for her, reports 100-120. She continues with novolin 70/30, 25 units in AM, 15 units in PM.   She had ECHO 02/5596 for mild systolic murmur which showed - Left ventricle: The cavity size was normal. Systolic function was vigorous. The estimated ejection fraction was in the range of 65% to 70%. Wall motion was normal; there were no regional wall motion abnormalities. Doppler parameters are consistent with abnormal left ventricular relaxation (grade 1 diastolic dysfunction). - Aortic valve: Moderate focal calcification involving the left coronary cusp.  Lab Results  Component Value Date   CREATININE 1.59 (H) 12/10/2018   CREATININE 1.40 (H) 09/24/2018   CREATININE 1.38 (H) 06/14/2018   Lab Results  Component Value Date   GFRAA 37 (L) 12/10/2018   She denies dyspnea on exertion, orthopnea and paroxysmal nocturnal dyspnea. Positive for lower extremity edema. Wt Readings from Last 3 Encounters:  01/04/19 248 lb 9.6 oz (112.8 kg)  12/10/18 248 lb (112.5 kg)  09/24/18 244 lb 12.8 oz (111 kg)     Past Medical History:  Diagnosis Date  . Allergic rhinitis   .  Allergy   . Anxiety    with medical procedures  . Hyperlipidemia   . Hypertension   . Obesity   . OSA (obstructive sleep apnea)    wears CPAP  . Type II or unspecified type diabetes mellitus with renal manifestations, not stated as uncontrolled(250.40)   . Unspecified hypothyroidism   . Vitamin D deficiency      Allergies  Allergen Reactions  .  Penicillins Swelling  . Sulfa Antibiotics Itching and Rash    Current Outpatient Medications on File Prior to Visit  Medication Sig  . allopurinol (ZYLOPRIM) 300 MG tablet TAKE 1 TABLET BY MOUTH ONCE DAILY  . aspirin EC 81 MG tablet Take 81 mg by mouth 2 (two) times daily.  Marland Kitchen atenolol (TENORMIN) 100 MG tablet TAKE ONE TABLET BY MOUTH ONCE DAILY (Patient taking differently: TAKE ONE/HALF TABLET BY MOUTH ONCE DAILY)  . BESIVANCE 0.6 % SUSP Place 1 drop into the left eye 4 (four) times daily.   . Blood Glucose Monitoring Suppl (ONE TOUCH ULTRA 2) w/Device KIT check blood sugar 3 times daily-DX-E10.9  . Cholecalciferol (VITAMIN D3) 5000 units CAPS Take 10,000 Units by mouth daily.  . diazepam (VALIUM) 5 MG tablet TAKE ONE TABLET BY MOUTH AT BEDTIME AS NEEDED FOR ANXIETY  . enalapril (VASOTEC) 20 MG tablet TAKE 1 TABLET BY MOUTH ONCE DAILY FOR BLOOD PRESSURE  . ferrous fumarate (HEMOCYTE - 106 MG FE) 325 (106 Fe) MG TABS tablet Take 325 mg of iron by mouth daily.   . furosemide (LASIX) 80 MG tablet Take 1 tablet 2 x /day for Fluid & Swelling  . glucose blood (ONE TOUCH ULTRA TEST) test strip CHECK BLOOD SUGAR 3 TIMES A DAY  . hyoscyamine (LEVSIN, ANASPAZ) 0.125 MG tablet Take 1 tablet (0.125 mg total) by mouth every 6 (six) hours as needed.  . insulin NPH-regular Human (NOVOLIN 70/30) (70-30) 100 UNIT/ML injection Inject into the skin. Patient injects 25 units in the AM and 15 units in the PM.  . Lancets (ONETOUCH ULTRASOFT) lancets Use as instructed Dx. Code: E11.22  . Lancets (ONETOUCH ULTRASOFT) lancets Check blood sugar 3 times daily-DX-E10.9  . levothyroxine (SYNTHROID, LEVOTHROID) 100 MCG tablet TAKE 1 TABLET BY MOUTH ONCE DAILY EXCEPT 1.5 ON Monday; IN THE MORNING ON AN EMPTY STOMACH WITH  ONLY  WATER  FOR  30  MINUTES.  . meloxicam (MOBIC) 15 MG tablet Take 1 tablet (15 mg total) by mouth daily as needed for pain. (Patient taking differently: Take 15 mg by mouth daily. )  . potassium  chloride (K-DUR) 10 MEQ tablet Take 1 tablet daily  . prednisoLONE acetate (PRED FORTE) 1 % ophthalmic suspension INSTILL 1 DROP INTO EACH EYE TWICE DAILY  . rosuvastatin (CRESTOR) 5 MG tablet Take 1 tab by mouth in the evening once a week for cholesterol.  . ciprofloxacin (CIPRO) 250 MG tablet Take 1 tablet (250 mg total) by mouth 2 (two) times daily.  . colchicine 0.6 MG tablet Take 1 tablet (0.6 mg total) by mouth daily. (Patient not taking: Reported on 01/04/2019)  . metFORMIN (GLUCOPHAGE XR) 500 MG 24 hr tablet Take 2 tablets 2 x/ day with meals for DIABETES  . metolazone (ZAROXOLYN) 5 MG tablet Takle 1 tablet daily with Lasix for Fluid Retention % swelling (Patient not taking: Reported on 01/04/2019)  . OVER THE COUNTER MEDICATION Take 1 tablet by mouth daily. Tumeric with Black Pepper   No current facility-administered medications on file prior to visit.  ROS: all negative except above.   Physical Exam:  BP 110/68   Pulse 76   Temp 97.9 F (36.6 C)   Ht 5' 2" (1.575 m)   Wt 248 lb 9.6 oz (112.8 kg)   SpO2 97%   BMI 45.47 kg/m   General Appearance: Well nourished, in no apparent distress. Eyes: PERRLA, conjunctiva no swelling or erythema ENT/Mouth: Hearing normal.  Neck: Supple, thyroid normal.  Respiratory: Respiratory effort normal, BS equal bilaterally without rales, rhonchi, wheezing or stridor.  Cardio: RRR with no MRGs. Brisk peripheral pulses, bil compression hose in place, bilateral non-pitting edema to ankles and feet Abdomen: Soft, + BS.  Non tender, no guarding. Lymphatics: Non tender without lymphadenopathy.  Musculoskeletal: normal gait.  Skin: Warm, dry without rashes, lesions, ecchymosis.  Neuro: Cranial nerves intact. Normal muscle tone, no cerebellar symptoms. Sensation intact.  Psych: Awake and oriented X 3, normal affect, Insight and Judgment appropriate.     Izora Ribas, NP 9:39 AM [facility]

## 2019-01-04 ENCOUNTER — Ambulatory Visit (INDEPENDENT_AMBULATORY_CARE_PROVIDER_SITE_OTHER): Payer: Medicare Other | Admitting: Adult Health

## 2019-01-04 ENCOUNTER — Encounter: Payer: Self-pay | Admitting: Adult Health

## 2019-01-04 VITALS — BP 110/68 | HR 76 | Temp 97.9°F | Ht 62.0 in | Wt 248.6 lb

## 2019-01-04 DIAGNOSIS — E1122 Type 2 diabetes mellitus with diabetic chronic kidney disease: Secondary | ICD-10-CM | POA: Diagnosis not present

## 2019-01-04 DIAGNOSIS — I10 Essential (primary) hypertension: Secondary | ICD-10-CM | POA: Diagnosis not present

## 2019-01-04 DIAGNOSIS — I872 Venous insufficiency (chronic) (peripheral): Secondary | ICD-10-CM | POA: Diagnosis not present

## 2019-01-04 DIAGNOSIS — Z6841 Body Mass Index (BMI) 40.0 and over, adult: Secondary | ICD-10-CM

## 2019-01-04 DIAGNOSIS — I878 Other specified disorders of veins: Secondary | ICD-10-CM

## 2019-01-04 DIAGNOSIS — N184 Chronic kidney disease, stage 4 (severe): Secondary | ICD-10-CM

## 2019-01-04 MED ORDER — FUROSEMIDE 80 MG PO TABS: ORAL_TABLET | ORAL | 1 refills | Status: DC

## 2019-01-04 NOTE — Patient Instructions (Addendum)
Cut down lasix to 80 mg ONCE daily    Avoid NSAIDS (ibuprofen, aleve, mobic, etc)  Push water intake - 65-80+ fluid ounces daily  Monitor blood pressure and swelling closely - call me if blood pressure is consistently above 130/80 or if swelling starts getting worse  Continue with daily compression hose, elevate legs at home    Preventing Chronic Kidney Disease Chronic kidney disease (CKD) occurs when the kidneys are damaged for at least 3 months and do not function effectively. The kidneys are two organs that do many important jobs in the body, including:  Removing waste and extra fluids from the blood.  Regulating hormones, blood pressure, and blood chemistry. At first, you may not notice any signs or symptoms of CKD. However, CKD gets worse over time (is progressive). You can prevent CKD or keep CKD from progressing by making certain changes to your lifestyle and nutrition. Risk factorsfor CKD include:  Being age 75 or older.  Being female.  Having any of the following: ? Diabetes. ? High blood pressure. ? Heart disease. ? An autoimmune disease. ? Frequent urinary tract infections. ? Polycystic kidney disease. ? A family history of kidney disease, heart disease, diabetes, or high blood pressure.  Having problems with urine flow that may be caused by: ? Cancer. ? Having kidney stones more than once. ? An enlarged prostate in males.  Being of African-American, Native American, Hispanic, Asian, or [place] descent.  Being obese.  Long-term use of NSAIDs.  Current or former tobacco use. What types of nutrition changes can I make? A balanced meal plan can help keep your kidneys healthy and prevent CKD. A balanced meal plan may involve:  Limiting salt (sodium) intake. You should have less than 1 tsp (2,300 mg) of sodium per day. If you have heart disease or high blood pressure, you should have less than  tsp (1,500 mg) of sodium per day.  Limiting alcohol  intake to no more than 1 drink a day for nonpregnant women and 2 drinks a day for men. One drink equals 12 oz of beer, 5 oz of wine, or 1 oz of hard liquor. If you have diabetes, work with a Financial planner (Firefighter) or a certified diabetes educator to develop a healthy eating plan. What types of lifestyle changes can I make? Lifestyle changes that can help prevent CKD include:  Talking with your health care provider about how much fluid you should drink each day.  Working with your health care provider to manage your blood pressure. This includes healthy eating, regular exercise, and taking any medicines that are prescribed for you.  Exercisingfor at least 30 minutes on five or more days of the week, or as much as told by your health care provider.  Keeping your weight at a healthy level. If you are overweight or obese, lose weight as told by your health care provider.  Not smoking or using any tobacco products, such as cigarettes, chewing tobacco, and e-cigarettes. If you need help quitting, ask your health care provider.  Having a yearly physical exam.  Learning about your family's medical history. Talk to your relatives and siblings about diabetes, heart disease, and high blood pressure.  Using NSAIDs for pain only when necessary. Ask your health care provider about other pain medicines that do not increase your risk of developing CKD. If you have diabetes, managing your diabetes with a healthy meal plan and physical activity can help prevent CKD. Work with your health care provider  to manage your condition. What can happen if changes are not made? If you do not make lifestyle and nutrition changes to protect your kidneys, you may develop CKD, which can lead to:  A low red blood cell count (anemia).  Heart disease.  Weak bones.  Nerve damage (neuropathy).  Stroke.  Kidney failure and dialysis. What can I do to lower my risk? Talk to your health care  provider about your kidney health and your risk factors for CKD. The most important steps to take to lower your risk of CKD are:  Getting high blood pressure down to the target that your health care provider recommends.  Getting blood sugar (glucose) levels down to the target that your health care provider recommends.  Eating less sodium. What are my treatment options for CKD? Treatment for CKD may include:  Medicines that: ? Control high blood pressure, such as ACE inhibitors. ? Control blood glucose levels. ? Control cholesterol levels. ? Help your body get rid of excess water (diuretics). ? Help protect your bones.  Lifestyle changes. You may need to: ? Eat less salt. ? Eat less protein. ? Follow a heart-healthy meal plan. ? Quit smoking. ? Avoid phosphorus. Phosphorous is found in dark-colored sodas and canned ice teas. ? Avoid potassium. Potassium is found in some juices, especially orange juice. ? Avoid alcohol.  Dialysis. This is when a machine filters your blood if your kidney fails.  Kidney transplant, if your kidney fails. Where can I get more information? Learn more about CKD and how to prevent CKD from:  The [place]: www.kidney.org  The American Association of Kidney Patients: BombTimer.gl  The American Diabetes Association: www.diabetes.org Summary You may not notice symptoms of CKD until your kidneys have already been damaged. The best way to prevent kidney damage is to know your risk factors and make nutrition and lifestyle changes before you develop symptoms of CKD. This information is not intended to replace advice given to you by your health care provider. Make sure you discuss any questions you have with your health care provider. Document Released: 01/04/2016 Document Revised: 08/20/2016 Document Reviewed: 11/05/2015 Elsevier Interactive Patient Education  2019 [employer].

## 2019-01-05 ENCOUNTER — Other Ambulatory Visit: Payer: Self-pay | Admitting: Adult Health

## 2019-01-05 DIAGNOSIS — N184 Chronic kidney disease, stage 4 (severe): Secondary | ICD-10-CM

## 2019-01-05 LAB — BASIC METABOLIC PANEL WITH GFR
BUN/Creatinine Ratio: 15 (calc) (ref 6–22)
BUN: 27 mg/dL — AB (ref 7–25)
CO2: 26 mmol/L (ref 20–32)
Calcium: 9.3 mg/dL (ref 8.6–10.4)
Chloride: 98 mmol/L (ref 98–110)
Creat: 1.76 mg/dL — ABNORMAL HIGH (ref 0.60–0.93)
GFR, Est African American: 33 mL/min/{1.73_m2} — ABNORMAL LOW (ref >=60)
GFR, Est Non African American: 28 mL/min/{1.73_m2} — ABNORMAL LOW (ref >=60)
GLUCOSE: 148 mg/dL — AB (ref 65–99)
Potassium: 4.2 mmol/L (ref 3.5–5.3)
Sodium: 138 mmol/L (ref 135–146)

## 2019-01-05 LAB — PTH, INTACT AND CALCIUM
Calcium: 9.3 mg/dL (ref 8.6–10.4)
PTH: 105 pg/mL — ABNORMAL HIGH (ref 14–64)

## 2019-01-14 NOTE — Progress Notes (Signed)
MEDICARE WELLNESS   Assessment and Plan:  Encounter for Medicare annual wellness exam 73 year Pneumoniae vaccine today  Essential hypertension - continue medications, DASH diet, exercise and monitor at home. Call if greater than 130/80.  - CMP/GFR  OSA (obstructive sleep apnea) Weight loss advised, needs to wear nightly, may benefit from new mask/titration   Hypothyroidism, unspecified hypothyroidism type Hypothyroidism-check TSH level, continue medications the same, reminded to take on an empty stomach 30-80mns before food.  - TSH   Hyperlipidemia - check lipids, decrease fatty foods, increase activity.  - Lipid panel   Vitamin D deficiency - Vit D  25 hydroxy (rtn osteoporosis monitoring)   Obesity, morbid Obesity with co morbidities- long discussion about weight loss, diet, and exercise  Medication management - Magnesium  CKD stage 3 due to type 2 diabetes mellitus Discussed general issues about diabetes pathophysiology and management., Educational material distributed., Suggested low cholesterol diet., Encouraged aerobic exercise., Discussed foot care., Reminded to get yearly retinal exam. - Hemoglobin A1c  Anxiety Will take valium prior to procedures but would like to be able to take AS needed for sleep, long discussion about addictive nature and tolerance, will only take PRN.   Macular degeneration, unspecified laterality, unspecified type  continue follow up eye doctor  Chronic gout without tophus, unspecified cause, unspecified site Gout- recheck Uric acid as needed, Diet discussed, continue medications.  Morbid obesity - follow up 3 months for progress monitoring - increase veggies, decrease carbs - long discussion about weight loss, diet, and exercise  Type 2 diabetes mellitus with hyperlipidemia ([place] Discussed general issues about diabetes pathophysiology and management., Educational material distributed., Suggested low cholesterol diet., Encouraged  aerobic exercise., Discussed foot care., Reminded to get yearly retinal exam. -     Hemoglobin A1c   Discussed med's effects and SE's. Screening labs and tests as requested with regular follow-up as recommended. Future Appointments  Date Time Provider [place] 01/25/2019  8:15 AM MHayden Pedro MD [facility] None  01/28/2019 10:15 AM CLiane Comber NP [facility] None  04/04/2019  9:30 AM MUnk Pinto MD [facility] None  08/04/2019  9:00 AM CLiane Comber NP [facility] None  12/21/2019 11:15 AM CLiane Comber NP [facility] None    Plan:   During the course of the visit the patient was educated and counseled about appropriate screening and preventive services including:    Pneumococcal vaccine   Prevnar 13  Influenza vaccine  Td vaccine  Screening electrocardiogram  Bone densitometry screening  Colorectal cancer screening  Diabetes screening  Glaucoma screening  Nutrition counseling   Advanced directives: requested   HPI  73y.o. AA female  presents for medicare wellness and 3 month follow up  She is getting shots for Mac Degen with Dr. MZigmund Daniel she is prescribed valium to take prior to eye injections.   She has OSA and does CPAP though admits to using sporadically/infrequently, admits that she only wears about 1 night per month. She has had some nasal congestion x 1 week, no fever, chills.    BMI is Body mass index is 45.65 kg/m., she has been working on diet, reducing white items/starches, and sweets.  Wt Readings from Last 3 Encounters:  01/17/19 249 lb 9.6 oz (113.2 kg)  01/04/19 248 lb 9.6 oz (112.8 kg)  12/10/18 248 lb (112.5 kg)   Her blood pressure has been controlled at home, today their BP is BP: 136/74 She does not workout due to knee pain.  She denies chest pain, shortness  of breath, dizziness.   She is on cholesterol medication and denies myalgias. Her cholesterol is at goal. The cholesterol last visit was:   Lab Results   Component Value Date   CHOL 132 12/10/2018   HDL 31 (L) 12/10/2018   LDLCALC 81 12/10/2018   TRIG 104 12/10/2018   CHOLHDL 4.3 12/10/2018   She has had diabetes for 3 years. She has been working on diet and exercise for Diabetes with CKD stage III, she is on ACE, she is on bASA, She is on metformin, she is on 70/30 insulin 25 units and 10 units and denies paresthesia of the feet, polydipsia and polyuria. She is checking her sugars, fasting ranging from 84- 120s. Checks sugars twice a day with one touch ultra.  Denies any hypoglycemia. Last A1C in the office was:  Lab Results  Component Value Date   HGBA1C 5.4 12/10/2018   LAST VISIT SHE WENT FROM STAGE 3 TO STAGE 4, HER LASIX WAS CUT DOWN TO 80MG ONCE A DAY AND METOLAZONE WAS STOPPED. SHE HAS EDEMA BUT HAS NO KNOWN HEART FAILURE, COUNSELED ON VENOUS INSUFFICIENCY AND COMPRESSION SOCKS. She has not noticed any swelling since stopping.  Lab Results  Component Value Date   GFRAA 33 (L) 01/04/2019   Patient is on Vitamin D supplement.   Lab Results  Component Value Date   VD25OH 74 09/24/2018     She is on thyroid medication. Her medication was changed last visit. She is on 100 mcg daily and 1.5 one day a week. She takes it 30 mins before food with just water.  Lab Results  Component Value Date   TSH 9.12 (H) 12/10/2018   Patient is on allopurinol for gout and does not report a recent flare.  Lab Results  Component Value Date   LABURIC 5.4 09/24/2018     Current Medications:  Current Outpatient Medications on File Prior to Visit  Medication Sig Dispense Refill  . allopurinol (ZYLOPRIM) 300 MG tablet TAKE 1 TABLET BY MOUTH ONCE DAILY 90 tablet 1  . aspirin EC 81 MG tablet Take 81 mg by mouth 2 (two) times daily.    Marland Kitchen atenolol (TENORMIN) 100 MG tablet TAKE ONE TABLET BY MOUTH ONCE DAILY (Patient taking differently: TAKE ONE/HALF TABLET BY MOUTH ONCE DAILY) 90 tablet 1  . BESIVANCE 0.6 % SUSP Place 1 drop into the left eye 4 (four)  times daily.     . Blood Glucose Monitoring Suppl (ONE TOUCH ULTRA 2) w/Device KIT check blood sugar 3 times daily-DX-E10.9 1 each 0  . Cholecalciferol (VITAMIN D3) 5000 units CAPS Take 10,000 Units by mouth daily.    . colchicine 0.6 MG tablet Take 1 tablet (0.6 mg total) by mouth daily. 30 tablet 2  . diazepam (VALIUM) 5 MG tablet TAKE ONE TABLET BY MOUTH AT BEDTIME AS NEEDED FOR ANXIETY 30 tablet 1  . enalapril (VASOTEC) 20 MG tablet TAKE 1 TABLET BY MOUTH ONCE DAILY FOR BLOOD PRESSURE 90 tablet 1  . ferrous fumarate (HEMOCYTE - 106 MG FE) 325 (106 Fe) MG TABS tablet Take 325 mg of iron by mouth daily.     . furosemide (LASIX) 80 MG tablet Take 1 tablet once daily for blood pressure and swelling. 180 tablet 1  . hyoscyamine (LEVSIN, ANASPAZ) 0.125 MG tablet Take 1 tablet (0.125 mg total) by mouth every 6 (six) hours as needed. 60 tablet 1  . insulin NPH-regular Human (NOVOLIN 70/30) (70-30) 100 UNIT/ML injection Inject into the skin. Patient  injects 25 units in the AM and 15 units in the PM.    . Lancets (ONETOUCH ULTRASOFT) lancets Use as instructed Dx. Code: E11.22 100 each 12  . Lancets (ONETOUCH ULTRASOFT) lancets Check blood sugar 3 times daily-DX-E10.9 300 each 1  . levothyroxine (SYNTHROID, LEVOTHROID) 100 MCG tablet TAKE 1 TABLET BY MOUTH ONCE DAILY EXCEPT 1.5 ON Monday; IN THE MORNING ON AN EMPTY STOMACH WITH  ONLY  WATER  FOR  30  MINUTES. 90 tablet 1  . meloxicam (MOBIC) 15 MG tablet Take 1 tablet (15 mg total) by mouth daily as needed for pain. (Patient taking differently: Take 15 mg by mouth daily. ) 30 tablet 3  . potassium chloride (K-DUR) 10 MEQ tablet Take 1 tablet daily 90 tablet 3  . prednisoLONE acetate (PRED FORTE) 1 % ophthalmic suspension INSTILL 1 DROP INTO EACH EYE TWICE DAILY 5 mL 0  . rosuvastatin (CRESTOR) 5 MG tablet Take 1 tab by mouth in the evening once a week for cholesterol. 30 tablet 1   No current facility-administered medications on file prior to visit.     Health Maintenance:   Immunization History  Administered Date(s) Administered  . DT 06/19/2015  . DTaP 12/09/2004  . Pneumococcal Conjugate-13 12/19/2014  . Pneumococcal Polysaccharide-23 03/02/2009   Last colonoscopy: 03/2014 normal Last mammogram: 04/2018, CAT A Last pap smear/pelvic exam: 2012, DONE DEXA: 11/2015 normal Carotid US 2013 CXR 2009  Prior vaccinations: TD or Tdap: 2016 Influenza: declines Pneumococcal: 2010  Prevnar 13: 2015 Shingles/Zostavax: declines  Eye exam Dr. Zigmund Daniel 12/11/2018- has follow up Feb 4th Dental: Dr. Delma Freeze, last exam a few years ago, will schedule   Patient Care Team: [name], MD as PCP - General (Internal Medicine) Warden Fillers, MD as Consulting Physician (Optometry) [name], MD as Consulting Physician (Ophthalmology) Inda Castle, MD (Inactive) as Consulting Physician (Gastroenterology) Druscilla Brownie, MD as Consulting Physician (Dermatology) Allyn Kenner, MD (Dermatology)   Medical History:  Past Medical History:  Diagnosis Date  . Allergic rhinitis   . Allergy   . Anxiety    with medical procedures  . Hyperlipidemia   . Hypertension   . Obesity   . OSA (obstructive sleep apnea)    wears CPAP  . Type II or unspecified type diabetes mellitus with renal manifestations, not stated as uncontrolled(250.40)   . Unspecified hypothyroidism   . Vitamin D deficiency    Allergies Allergies  Allergen Reactions  . Penicillins Swelling  . Sulfa Antibiotics Itching and Rash    SURGICAL HISTORY She  has a past surgical history that includes  eye procedure; Tubal ligation; and Dental examination under anesthesia. FAMILY HISTORY Her family history includes Breast cancer in her mother; Diabetes in her maternal grandmother and sister; Hypertension in her sister; Renal Disease in her mother. SOCIAL HISTORY She  reports that she has never smoked. She has never used smokeless tobacco. She reports  current alcohol use. She reports that she does not use drugs.  MEDICARE WELLNESS OBJECTIVES: Physical activity: Current Exercise Habits: The patient does not participate in regular exercise at present, Exercise limited by: orthopedic condition(s) Cardiac risk factors: Cardiac Risk Factors include: advanced age (>68mn, >>58women);dyslipidemia;hypertension;obesity (BMI >30kg/m2);sedentary lifestyle;diabetes mellitus Depression/mood screen:   [facility] 01/17/2019  Decreased Interest 0  Down, Depressed, Hopeless 0  PHQ - 2 Score 0    ADLs:  In your present state of health, do you have any difficulty performing the following activities: 01/17/2019 12/10/2018  Hearing? N  N  Vision? Y Y  Comment getting injections getting injections for macular degeneration   Difficulty concentrating or making decisions? N N  Walking or climbing stairs? N N  Comment some pain in knees some mild pain in knees, but can manage  Dressing or bathing? N N  Doing errands, shopping? N N  Some recent data might be hidden     Cognitive Testing  Alert? Yes  Normal Appearance?Yes  Oriented to person? Yes  Place? Yes   Time? Yes  Recall of three objects?  Yes  Can perform simple calculations? Yes  Displays appropriate judgment?Yes  Can read the correct time from a watch face?Yes  EOL planning: Does Patient Have a Medical Advance Directive?: No Would patient like information on creating a medical advance directive?: Yes (MAU/Ambulatory/Procedural Areas - Information given) No, given info last visit   Review of Systems  Constitutional: Negative.   HENT: Negative.   Eyes: Negative.   Respiratory: Negative.   Cardiovascular: Negative.   Gastrointestinal: Negative.   Genitourinary: Negative for dysuria, flank pain, frequency, hematuria and urgency.  Musculoskeletal: Positive for joint pain (bilateral knee pain).  Skin: Negative.   Neurological: Negative.   Endo/Heme/Allergies: Negative.    Psychiatric/Behavioral: Negative for depression, hallucinations, memory loss, substance abuse and suicidal ideas. The patient is not nervous/anxious and does not have insomnia.     Physical Exam: Estimated body mass index is 45.65 kg/m as calculated from the following:   Height as of this encounter: 5' 2"  (1.575 m).   Weight as of this encounter: 249 lb 9.6 oz (113.2 kg). BP 136/74   Pulse 68   Temp 97.9 F (36.6 C)   Ht 5' 2"  (1.575 m)   Wt 249 lb 9.6 oz (113.2 kg)   SpO2 96%   BMI 45.65 kg/m  General Appearance: Well nourished, in no apparent distress.  Eyes: PERRLA, EOMs, conjunctiva no swelling or erythema, normal fundi and vessels.  Sinuses: No Frontal/maxillary tenderness  ENT/Mouth: Ext aud canals clear, normal light reflex with TMs without erythema, bulging. Good dentition. No erythema, swelling, or exudate on post pharynx. Tonsils not swollen or erythematous. Hearing normal.  Neck: Supple, thyroid normal. No bruits  Respiratory: Respiratory effort normal, BS equal bilaterally without rales, rhonchi, wheezing or stridor.  Cardio: RRR with 2/6 systolic murmurs RSB, without rubs or gallops. Brisk peripheral pulses with mild edema.  Chest: symmetric, with normal excursions and percussion.  Breasts: declines, states getting MGM Abdomen: Soft, nontender, no guarding, rebound, hernias, masses, or organomegaly. .  Lymphatics: Non tender without lymphadenopathy.  Genitourinary: defer Musculoskeletal: Full ROM all peripheral extremities,5/5 strength, and normal gait.  Skin: Vitiligo bilateral hands and feet. Warm, dry without rashes, lesions, ecchymosis. Neuro: Cranial nerves intact, reflexes equal bilaterally. Normal muscle tone, no cerebellar symptoms. Sensation intact.  Psych: Awake and oriented X 3, normal affect, Insight and Judgment appropriate.    Medicare Attestation I have personally reviewed: The patient's medical and social history Their use of alcohol, tobacco or  illicit drugs Their current medications and supplements The patient's functional ability including ADLs,fall risks, home safety risks, cognitive, and hearing and visual impairment Diet and physical activities Evidence for depression or mood disorders  The patient's weight, height, BMI, and visual acuity have been recorded in the chart.  I have made referrals, counseling, and provided education to the patient based on review of the above and I have provided the patient with a written personalized care plan for preventive services.  Vicie Mutters 9:02 AM [facility]

## 2019-01-17 ENCOUNTER — Encounter: Payer: Self-pay | Admitting: Physician Assistant

## 2019-01-17 ENCOUNTER — Ambulatory Visit (INDEPENDENT_AMBULATORY_CARE_PROVIDER_SITE_OTHER): Payer: Medicare Other | Admitting: Physician Assistant

## 2019-01-17 VITALS — BP 136/74 | HR 68 | Temp 97.9°F | Ht 62.0 in | Wt 249.6 lb

## 2019-01-17 DIAGNOSIS — N183 Chronic kidney disease, stage 3 unspecified: Secondary | ICD-10-CM

## 2019-01-17 DIAGNOSIS — E1122 Type 2 diabetes mellitus with diabetic chronic kidney disease: Secondary | ICD-10-CM | POA: Diagnosis not present

## 2019-01-17 DIAGNOSIS — Z79899 Other long term (current) drug therapy: Secondary | ICD-10-CM

## 2019-01-17 DIAGNOSIS — D649 Anemia, unspecified: Secondary | ICD-10-CM

## 2019-01-17 DIAGNOSIS — Z23 Encounter for immunization: Secondary | ICD-10-CM

## 2019-01-17 DIAGNOSIS — E039 Hypothyroidism, unspecified: Secondary | ICD-10-CM

## 2019-01-17 DIAGNOSIS — I10 Essential (primary) hypertension: Secondary | ICD-10-CM

## 2019-01-17 DIAGNOSIS — M1A9XX0 Chronic gout, unspecified, without tophus (tophi): Secondary | ICD-10-CM

## 2019-01-17 DIAGNOSIS — Z794 Long term (current) use of insulin: Secondary | ICD-10-CM

## 2019-01-17 DIAGNOSIS — E1169 Type 2 diabetes mellitus with other specified complication: Secondary | ICD-10-CM

## 2019-01-17 DIAGNOSIS — E785 Hyperlipidemia, unspecified: Secondary | ICD-10-CM

## 2019-01-17 DIAGNOSIS — Z0001 Encounter for general adult medical examination with abnormal findings: Secondary | ICD-10-CM

## 2019-01-17 DIAGNOSIS — F411 Generalized anxiety disorder: Secondary | ICD-10-CM

## 2019-01-17 DIAGNOSIS — R6889 Other general symptoms and signs: Secondary | ICD-10-CM

## 2019-01-17 DIAGNOSIS — H3530 Unspecified macular degeneration: Secondary | ICD-10-CM

## 2019-01-17 DIAGNOSIS — Z0000 Encounter for general adult medical examination without abnormal findings: Secondary | ICD-10-CM

## 2019-01-17 DIAGNOSIS — E11319 Type 2 diabetes mellitus with unspecified diabetic retinopathy without macular edema: Secondary | ICD-10-CM

## 2019-01-17 DIAGNOSIS — N182 Chronic kidney disease, stage 2 (mild): Secondary | ICD-10-CM

## 2019-01-17 DIAGNOSIS — E559 Vitamin D deficiency, unspecified: Secondary | ICD-10-CM

## 2019-01-17 DIAGNOSIS — G4733 Obstructive sleep apnea (adult) (pediatric): Secondary | ICD-10-CM

## 2019-01-17 MED ORDER — GLUCOSE BLOOD VI STRP: ORAL_STRIP | 3 refills | Status: DC

## 2019-01-17 NOTE — Patient Instructions (Signed)
Do the following things EVERYDAY: 1) Weigh yourself in the morning before breakfast or at the same time every day. Write it down and keep it in a log. 2) Take your medicines as prescribed 3) Eat low salt foods-Limit salt (sodium) to 2000 mg per day. Best thing to do is avoid processed foods.   4) Stay as active as you can everyday  Call your doctor if:  Anytime you have any of the following symptoms:  1) 5 pound weight gain in 24 hours  2) shortness of breath, with or without a dry hacking cough  3) swelling in the hands, LEGs, feet or stomach  4) if you have to sleep on extra pillows at night in order to breathe. 5) after laying down at night for 20-30 mins, you wake up short of breath.   These can all be signs of fluid overload.       When it comes to diets, agreement about the perfect plan isn't easy to find, even among the experts. Experts at the [employer] developed an idea known as the Healthy Eating Plate. Just imagine a plate divided into logical, healthy portions.  The emphasis is on diet quality:  Load up on vegetables and fruits - one-half of your plate: Aim for color and variety, and remember that potatoes don't count.  Go for whole grains - one-quarter of your plate: Whole wheat, barley, wheat berries, quinoa, oats, brown rice, and foods made with them. If you want pasta, go with whole wheat pasta.  Protein power - one-quarter of your plate: Fish, chicken, beans, and nuts are all healthy, versatile protein sources. Limit red meat.  The diet, however, does go beyond the plate, offering a few other suggestions.  Use healthy plant oils, such as olive, canola, soy, corn, sunflower and peanut. Check the labels, and avoid partially hydrogenated oil, which have unhealthy trans fats.  If you're thirsty, drink water. Coffee and tea are good in moderation, but skip sugary drinks and limit milk and dairy products to one or two daily servings.  The type of  carbohydrate in the diet is more important than the amount. Some sources of carbohydrates, such as vegetables, fruits, whole grains, and beans-are healthier than others.  Finally, stay active.

## 2019-01-18 LAB — COMPLETE METABOLIC PANEL WITH GFR
AG Ratio: 1.4 (calc) (ref 1.0–2.5)
ALT: 9 U/L (ref 6–29)
AST: 13 U/L (ref 10–35)
Albumin: 4 g/dL (ref 3.6–5.1)
Alkaline phosphatase (APISO): 74 U/L (ref 33–130)
BILIRUBIN TOTAL: 0.7 mg/dL (ref 0.2–1.2)
BUN/Creatinine Ratio: 16 (calc) (ref 6–22)
BUN: 24 mg/dL (ref 7–25)
CO2: 29 mmol/L (ref 20–32)
Calcium: 9.2 mg/dL (ref 8.6–10.4)
Chloride: 102 mmol/L (ref 98–110)
Creat: 1.54 mg/dL — ABNORMAL HIGH (ref 0.60–0.93)
GFR, Est African American: 39 mL/min/{1.73_m2} — ABNORMAL LOW (ref >=60)
GFR, Est Non African American: 33 mL/min/{1.73_m2} — ABNORMAL LOW (ref >=60)
Globulin: 2.8 g/dL (calc) (ref 1.9–3.7)
Glucose, Bld: 146 mg/dL — ABNORMAL HIGH (ref 65–99)
Potassium: 4.6 mmol/L (ref 3.5–5.3)
SODIUM: 137 mmol/L (ref 135–146)
Total Protein: 6.8 g/dL (ref 6.1–8.1)

## 2019-01-18 LAB — TSH: TSH: 11.54 mIU/L — ABNORMAL HIGH (ref 0.40–4.50)

## 2019-01-23 ENCOUNTER — Other Ambulatory Visit: Payer: Self-pay | Admitting: Adult Health

## 2019-01-24 ENCOUNTER — Encounter (INDEPENDENT_AMBULATORY_CARE_PROVIDER_SITE_OTHER): Payer: Medicare Other | Admitting: Ophthalmology

## 2019-01-24 ENCOUNTER — Telehealth: Payer: Self-pay

## 2019-01-24 ENCOUNTER — Other Ambulatory Visit: Payer: Self-pay | Admitting: Physician Assistant

## 2019-01-24 NOTE — Telephone Encounter (Signed)
Pt had a question about seeing a Kidney specialist. Does she still need to see one?  Per provider it would not be a bad idea to getting in with a specialist for her kidneys for the future.  Pt voiced understanding and agreed.

## 2019-01-24 NOTE — Progress Notes (Unsigned)
Lab Results  Component Value Date   GFRAA 39 (L) 01/17/2019

## 2019-01-25 ENCOUNTER — Encounter (INDEPENDENT_AMBULATORY_CARE_PROVIDER_SITE_OTHER): Payer: Medicare Other | Admitting: Ophthalmology

## 2019-01-25 DIAGNOSIS — E11311 Type 2 diabetes mellitus with unspecified diabetic retinopathy with macular edema: Secondary | ICD-10-CM | POA: Diagnosis not present

## 2019-01-25 DIAGNOSIS — E113391 Type 2 diabetes mellitus with moderate nonproliferative diabetic retinopathy without macular edema, right eye: Secondary | ICD-10-CM

## 2019-01-25 DIAGNOSIS — I10 Essential (primary) hypertension: Secondary | ICD-10-CM | POA: Diagnosis not present

## 2019-01-25 DIAGNOSIS — E113312 Type 2 diabetes mellitus with moderate nonproliferative diabetic retinopathy with macular edema, left eye: Secondary | ICD-10-CM

## 2019-01-25 DIAGNOSIS — H35033 Hypertensive retinopathy, bilateral: Secondary | ICD-10-CM

## 2019-01-25 DIAGNOSIS — H43813 Vitreous degeneration, bilateral: Secondary | ICD-10-CM

## 2019-01-25 DIAGNOSIS — H34812 Central retinal vein occlusion, left eye, with macular edema: Secondary | ICD-10-CM | POA: Diagnosis not present

## 2019-01-25 LAB — HM DIABETES EYE EXAM

## 2019-01-28 ENCOUNTER — Ambulatory Visit: Payer: Self-pay | Admitting: Adult Health

## 2019-02-01 ENCOUNTER — Other Ambulatory Visit: Payer: Self-pay | Admitting: Physician Assistant

## 2019-02-07 ENCOUNTER — Encounter: Payer: Self-pay | Admitting: *Deleted

## 2019-02-08 ENCOUNTER — Other Ambulatory Visit: Payer: Self-pay

## 2019-02-08 MED ORDER — MELOXICAM 15 MG PO TABS: 15.0000 mg | ORAL_TABLET | Freq: Every day | ORAL | 3 refills | Status: DC | PRN

## 2019-02-17 ENCOUNTER — Ambulatory Visit: Payer: Self-pay

## 2019-02-18 ENCOUNTER — Ambulatory Visit (INDEPENDENT_AMBULATORY_CARE_PROVIDER_SITE_OTHER): Payer: Medicare Other

## 2019-02-18 VITALS — BP 122/70 | HR 72 | Temp 97.9°F | Wt 249.0 lb

## 2019-02-18 DIAGNOSIS — E039 Hypothyroidism, unspecified: Secondary | ICD-10-CM | POA: Diagnosis not present

## 2019-02-18 LAB — TSH: TSH: 2.5 mIU/L (ref 0.40–4.50)

## 2019-02-18 NOTE — Progress Notes (Signed)
Patient presents to the office for a nurse visit to have labs done to recheck thyroid level. Patient states that she is taking Levothyroxine as instructed, 1.5 tablets on M,W, Th and 1 tablet all other days. No questions or concerns at this time.   Vitals taken and recorded.

## 2019-02-24 ENCOUNTER — Telehealth: Payer: Self-pay

## 2019-02-24 ENCOUNTER — Telehealth: Payer: Self-pay | Admitting: Physician Assistant

## 2019-02-24 MED ORDER — LOSARTAN POTASSIUM 100 MG PO TABS: ORAL_TABLET | ORAL | 1 refills | Status: DC

## 2019-02-24 NOTE — Telephone Encounter (Signed)
PT called wanting med instructions VM left on machine so she can listen to the message again.  Unable to LVM due to mailbox being full at the time of the call.  She did not have anything to write with at the time of the visit.

## 2019-02-24 NOTE — Telephone Encounter (Signed)
The patient has been notified of this information and all questions answered.

## 2019-02-24 NOTE — Telephone Encounter (Signed)
Fax from [facility] states that there is an interaction between enalapril and allopurinol Will switch to losartan 100mg , start 1/2 pill a day, monitor BP and increase to a whole pill if BP is above 120/60  Lab Results  Component Value Date   GFRAA 39 (L) 01/17/2019

## 2019-02-25 ENCOUNTER — Other Ambulatory Visit: Payer: Self-pay

## 2019-02-25 MED ORDER — ALLOPURINOL 300 MG PO TABS: 150.0000 mg | ORAL_TABLET | Freq: Every day | ORAL | 1 refills | Status: DC

## 2019-02-28 ENCOUNTER — Telehealth: Payer: Self-pay

## 2019-02-28 NOTE — Telephone Encounter (Signed)
Patient has been informed.

## 2019-02-28 NOTE — Telephone Encounter (Signed)
Spoke with patient and she states she will call her kidney specialist & check with them just to make sure of what she should be doing, Jocelyn Camacho states she will call the office back with that information as well.     March 9th 2020 at 3:15pm

## 2019-02-28 NOTE — Telephone Encounter (Signed)
-----   Message from Vicie Mutters, [place] sent at 02/25/2019 12:40 PM EST ----- Regarding: RE: med Contact: [phone] Listen to the kidney specialist.  Estill Bamberg ----- Message ----- From: Elenor Quinones, CMA Sent: 02/25/2019  10:07 AM EST To: Vicie Mutters, PA-C Subject: med                                            Per office note           Kidney specialist told her to stop taking the allopurinol & take the losartan instead Please advise?

## 2019-03-02 ENCOUNTER — Other Ambulatory Visit: Payer: Self-pay | Admitting: Nephrology

## 2019-03-02 DIAGNOSIS — N183 Chronic kidney disease, stage 3 unspecified: Secondary | ICD-10-CM

## 2019-03-07 ENCOUNTER — Telehealth: Payer: Self-pay

## 2019-03-07 NOTE — Telephone Encounter (Signed)
Patient miss understood one of her provider's directions (Dr. Posey Pronto)......Marland Kitchen Dr. Posey Pronto did not tell her to stop taking ALLOPURINOL.  He told her to stop taking the MELOXICAM.   Jocelyn Camacho just wanted to inform the office of this.

## 2019-03-08 ENCOUNTER — Other Ambulatory Visit: Payer: Self-pay

## 2019-03-08 ENCOUNTER — Encounter (INDEPENDENT_AMBULATORY_CARE_PROVIDER_SITE_OTHER): Payer: Medicare Other | Admitting: Ophthalmology

## 2019-03-08 DIAGNOSIS — H34812 Central retinal vein occlusion, left eye, with macular edema: Secondary | ICD-10-CM | POA: Diagnosis not present

## 2019-03-08 DIAGNOSIS — H35033 Hypertensive retinopathy, bilateral: Secondary | ICD-10-CM | POA: Diagnosis not present

## 2019-03-08 DIAGNOSIS — H43813 Vitreous degeneration, bilateral: Secondary | ICD-10-CM | POA: Diagnosis not present

## 2019-03-08 DIAGNOSIS — I10 Essential (primary) hypertension: Secondary | ICD-10-CM

## 2019-03-11 ENCOUNTER — Ambulatory Visit
Admission: RE | Admit: 2019-03-11 | Discharge: 2019-03-11 | Disposition: A | Discharge status: Home / Self Care | Payer: Medicare Other | Source: Ambulatory Visit | Attending: Nephrology | Admitting: Nephrology

## 2019-03-11 DIAGNOSIS — N183 Chronic kidney disease, stage 3 unspecified: Secondary | ICD-10-CM

## 2019-04-03 ENCOUNTER — Encounter: Payer: Self-pay | Admitting: Internal Medicine

## 2019-04-03 DIAGNOSIS — Z6841 Body Mass Index (BMI) 40.0 and over, adult: Secondary | ICD-10-CM

## 2019-04-03 DIAGNOSIS — E782 Mixed hyperlipidemia: Secondary | ICD-10-CM

## 2019-04-03 NOTE — Progress Notes (Signed)
History of Present Illness:      This very nice 73 y.o.  MBF presents for 3  month follow up with HTN, HLD, Morbid Obesity, Insulin req T2_DM and Vitamin D Deficiency. Patient has OSA and admits that she sporadically uses her CPAP.      Patient is treated for HTN (1994) & BP has been controlled at home. Today's BP is at goal - 128/72. Patient has had no complaints of any cardiac type chest pain, palpitations, dyspnea / orthopnea / PND, dizziness, claudication, or dependent edema.      Hyperlipidemia is controlled with diet & meds. Patient denies myalgias or other med SE's. 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 She denies  symptoms of reactive hypoglycemia, diabetic polys, paresthesias or visual blurring. Patient admits Gluttonous overeating.  She alleges CBG's range between 110-120 mg%.  Last A1c was at goal: Lab Results  Component Value Date   HGBA1C 5.4 12/10/2018      Patient was initiated on Thyroid Replacement in 2004.      Further, the patient also has history of Vitamin D Deficiency("38" / 2016) and supplements vitamin D without any suspected side-effects. Last vitamin D was at goal: Lab Results  Component Value Date   VD25OH 74 09/24/2018   Current Outpatient Medications on File Prior to Visit  Medication Sig  . allopurinol (ZYLOPRIM) 300 MG tablet Take 0.5 tablets (150 mg total) by mouth daily.  Marland Kitchen aspirin EC 81 MG tablet Take 81 mg by mouth 2 (two) times daily.  Marland Kitchen atenolol (TENORMIN) 100 MG tablet TAKE ONE TABLET BY MOUTH ONCE DAILY (Patient taking differently: TAKE ONE/HALF TABLET BY MOUTH ONCE DAILY)  . BESIVANCE 0.6 % SUSP Place 1 drop into the left eye 4 (four) times daily.   . Blood  Glucose Monitoring Suppl (ONE TOUCH ULTRA 2) w/Device KIT check blood sugar 3 times daily-DX-E10.9  . Cholecalciferol (VITAMIN D3) 5000 units CAPS Take 10,000 Units by mouth daily.  . ferrous fumarate (HEMOCYTE - 106 MG FE) 325 (106 Fe) MG TABS tablet Take 325 mg of iron by mouth daily.   . furosemide (LASIX) 80 MG tablet Take 1 tablet once daily for blood pressure and swelling.  Marland Kitchen glucose blood (ONE TOUCH ULTRA TEST) test strip CHECK BLOOD SUGAR 3 TIMES A DAY  . hyoscyamine (LEVSIN, ANASPAZ) 0.125 MG tablet Take 1 tablet (0.125 mg total) by mouth every 6 (six) hours as needed.  . insulin NPH-regular Human (NOVOLIN 70/30) (70-30) 100 UNIT/ML injection Inject into the skin. Patient injects 25 units in the AM and 15 units in the PM.  . Lancets (ONETOUCH ULTRASOFT) lancets Use as instructed Dx. Code: E11.22  . Lancets (ONETOUCH ULTRASOFT) lancets Check blood sugar 3 times daily-DX-E10.9  . levothyroxine (SYNTHROID, LEVOTHROID) 100 MCG tablet TAKE 1 TABLET BY MOUTH ONCE DAILY EXCEPT 1.5 ON Monday; IN THE MORNING ON AN EMPTY STOMACH WITH  ONLY  WATER  FOR  30  MINUTES. (Patient taking differently: Takes 1.5 tablets 3 days a week and 1 tablets 4 days a week.)  . losartan (COZAAR) 100 MG tablet 1/2-1 tablet daily  . potassium chloride (K-DUR) 10  MEQ tablet Take 1 tablet daily  . prednisoLONE acetate (PRED FORTE) 1 % ophthalmic suspension INSTILL 1 DROP INTO EACH EYE TWICE DAILY  . rosuvastatin (CRESTOR) 5 MG tablet Take 1 tab by mouth in the evening once a week for cholesterol.   No current facility-administered medications on file prior to visit.    Allergies  Allergen Reactions  . Penicillins Swelling  . Sulfa Antibiotics Itching and Rash   PMHx:   Past Medical History:  Diagnosis Date  . Allergic rhinitis   . Allergy   . Anxiety    with medical procedures  . Hyperlipidemia   . Hypertension   . Obesity   . OSA (obstructive sleep apnea)    wears CPAP  . Type II or unspecified type  diabetes mellitus with renal manifestations, not stated as uncontrolled(250.40)   . Unspecified hypothyroidism   . Vitamin D deficiency    Immunization History  Administered Date(s) Administered  . DT 06/19/2015  . DTaP 12/09/2004  . Pneumococcal Conjugate-13 12/19/2014  . Pneumococcal Polysaccharide-23 03/02/2009, 01/17/2019   Past Surgical History:  Procedure Laterality Date  .  eye procedure     have treatment where a needle is stuck into left eye- since June 2014; now every six weeks  . DENTAL EXAMINATION UNDER ANESTHESIA    . TUBAL LIGATION     FHx:    Reviewed / unchanged SHx:    Reviewed / unchanged   Systems Review:  Constitutional: Denies fever, chills, wt changes, headaches, insomnia, fatigue, night sweats, change in appetite. Eyes: Denies redness, blurred vision, diplopia, discharge, itchy, watery eyes.  ENT: Denies discharge, congestion, post nasal drip, epistaxis, sore throat, earache, hearing loss, dental pain, tinnitus, vertigo, sinus pain, snoring.  CV: Denies chest pain, palpitations, irregular heartbeat, syncope, dyspnea, diaphoresis, orthopnea, PND, claudication or edema. Respiratory: denies cough, dyspnea, DOE, pleurisy, hoarseness, laryngitis, wheezing.  Gastrointestinal: Denies dysphagia, odynophagia, heartburn, reflux, water brash, abdominal pain or cramps, nausea, vomiting, bloating, diarrhea, constipation, hematemesis, melena, hematochezia  or hemorrhoids. Genitourinary: Denies dysuria, frequency, urgency, nocturia, hesitancy, discharge, hematuria or flank pain. Musculoskeletal: Denies arthralgias, myalgias, stiffness, jt. swelling, pain, limping or strain/sprain.  Skin: Denies pruritus, rash, hives, warts, acne, eczema or change in skin lesion(s). Neuro: No weakness, tremor, incoordination, spasms, paresthesia or pain. Psychiatric: Denies confusion, memory loss or sensory loss. Endo: Denies change in weight, skin or hair change.  Heme/Lymph: No excessive  bleeding, bruising or enlarged lymph nodes.  Physical Exam  BP 128/72   Pulse 80   Temp (!) 97.2 F (36.2 C)   Resp 18   Ht '5\' 2"'$  (1.575 m)   Wt 246 lb (111.6 kg)   BMI 44.99 kg/m   Appears  Over nourished, well groomed  and in no distress.  Eyes: PERRLA, EOMs, conjunctiva no swelling or erythema. Sinuses: No frontal/maxillary tenderness ENT/Mouth: EAC's clear, TM's nl w/o erythema, bulging. Nares clear w/o erythema, swelling, exudates. Oropharynx clear without erythema or exudates. Oral hygiene is good. Tongue normal, non obstructing. Hearing intact.  Neck: Supple. Thyroid not palpable. Car 2+/2+ without bruits, nodes or JVD. Chest: Respirations nl with BS clear & equal w/o rales, rhonchi, wheezing or stridor.  Cor: Heart sounds normal w/ regular rate and rhythm without sig. murmurs, gallops, clicks or rubs. Peripheral pulses normal and equal  without edema.  Abdomen: Soft & bowel sounds normal. Non-tender w/o guarding, rebound, hernias, masses or organomegaly.  Lymphatics: Unremarkable.  Musculoskeletal: Full ROM all peripheral extremities, joint stability, 5/5 strength and normal gait.  Skin: Warm, dry without exposed rashes, lesions or ecchymosis apparent.  Neuro: Cranial nerves intact, reflexes equal bilaterally. Sensory-motor testing grossly intact. Tendon reflexes grossly intact.  Pysch: Alert & oriented x 3.  Insight and judgement nl & appropriate. No ideations.  Assessment and Plan:  1. Essential hypertension  - Continue medication, monitor blood pressure at home.  - Continue DASH diet.  Reminder to go to the ER if any CP,  SOB, nausea, dizziness, severe HA, changes vision/speech.  - CBC with Differential/Platelet - COMPLETE METABOLIC PANEL WITH GFR - Magnesium - TSH  2. Hyperlipidemia, mixed  - Continue diet/meds, exercise,& lifestyle modifications.  - Continue monitor periodic cholesterol/liver & renal functions   - Lipid panel - TSH  3. Type 2 diabetes  mellitus with stage 3 chronic kidney disease, with long-term current use of insulin (HCC)  - Continue diet, exercise  - Lifestyle modifications.  - Monitor appropriate labs.  - Hemoglobin A1c  4. Class 3 severe obesity due to excess calories with serious comorbidity and body mass index (BMI) of 45.0 to 49.9 in adult [facility])  - Long discussion with patient re: proper eating behaviors  5. Vitamin D deficiency  - Continue supplementation.  - VITAMIN D 25 Hydroxyl  6. Hypothyroidism  - TSH  7. CKD stage 3 due to type 2 diabetes mellitus (HCC)  - COMPLETE METABOLIC PANEL WITH GFR  8. Medication management  - CBC with Differential/Platelet - COMPLETE METABOLIC PANEL WITH GFR - Magnesium - Lipid panel - TSH - Hemoglobin A1c - VITAMIN D 25 Hydroxyl       Discussed  regular exercise, BP monitoring, weight control to achieve /maintain BMI less than 25 and discussed med and SE's. Recommended labs to assess and monitor clinical status with further disposition pending results of labs. I discussed the assessment and treatment plan with the patient. The patient was provided an opportunity to ask questions and all were answered. The patient agreed with the plan and demonstrated an understanding of the instructions. Over 30 minutes of exam, counseling, chart review was performed.  [name], MD

## 2019-04-03 NOTE — Patient Instructions (Signed)

## 2019-04-04 ENCOUNTER — Ambulatory Visit (INDEPENDENT_AMBULATORY_CARE_PROVIDER_SITE_OTHER): Payer: Medicare Other | Admitting: Internal Medicine

## 2019-04-04 ENCOUNTER — Other Ambulatory Visit: Payer: Self-pay

## 2019-04-04 ENCOUNTER — Encounter: Payer: Self-pay | Admitting: Internal Medicine

## 2019-04-04 VITALS — BP 128/72 | HR 80 | Temp 97.2°F | Resp 18 | Ht 62.0 in | Wt 246.0 lb

## 2019-04-04 DIAGNOSIS — Z79899 Other long term (current) drug therapy: Secondary | ICD-10-CM

## 2019-04-04 DIAGNOSIS — E559 Vitamin D deficiency, unspecified: Secondary | ICD-10-CM

## 2019-04-04 DIAGNOSIS — I10 Essential (primary) hypertension: Secondary | ICD-10-CM

## 2019-04-04 DIAGNOSIS — N183 Chronic kidney disease, stage 3 unspecified: Secondary | ICD-10-CM

## 2019-04-04 DIAGNOSIS — Z794 Long term (current) use of insulin: Secondary | ICD-10-CM

## 2019-04-04 DIAGNOSIS — E782 Mixed hyperlipidemia: Secondary | ICD-10-CM

## 2019-04-04 DIAGNOSIS — E039 Hypothyroidism, unspecified: Secondary | ICD-10-CM

## 2019-04-04 DIAGNOSIS — E1122 Type 2 diabetes mellitus with diabetic chronic kidney disease: Secondary | ICD-10-CM

## 2019-04-04 DIAGNOSIS — Z6841 Body Mass Index (BMI) 40.0 and over, adult: Secondary | ICD-10-CM

## 2019-04-05 ENCOUNTER — Other Ambulatory Visit: Payer: Self-pay | Admitting: Internal Medicine

## 2019-04-05 DIAGNOSIS — N183 Chronic kidney disease, stage 3 (moderate): Principal | ICD-10-CM

## 2019-04-05 DIAGNOSIS — E1122 Type 2 diabetes mellitus with diabetic chronic kidney disease: Secondary | ICD-10-CM

## 2019-04-05 DIAGNOSIS — Z794 Long term (current) use of insulin: Principal | ICD-10-CM

## 2019-04-05 LAB — VITAMIN D 25 HYDROXY (VIT D DEFICIENCY, FRACTURES): Vit D, 25-Hydroxy: 85 ng/mL (ref 30–100)

## 2019-04-05 LAB — CBC WITH DIFFERENTIAL/PLATELET
Absolute Monocytes: 658 cells/uL (ref 200–950)
Basophils Absolute: 56 cells/uL (ref 0–200)
Basophils Relative: 0.6 %
Eosinophils Absolute: 338 cells/uL (ref 15–500)
Eosinophils Relative: 3.6 %
HCT: 31 % — ABNORMAL LOW (ref 35.0–45.0)
Hemoglobin: 10.2 g/dL — ABNORMAL LOW (ref 11.7–15.5)
Lymphs Abs: 2341 cells/uL (ref 850–3900)
MCH: 28 pg (ref 27.0–33.0)
MCHC: 32.9 g/dL (ref 32.0–36.0)
MCV: 85.2 fL (ref 80.0–100.0)
MPV: 13 fL — ABNORMAL HIGH (ref 7.5–12.5)
Monocytes Relative: 7 %
Neutro Abs: 6007 cells/uL (ref 1500–7800)
Neutrophils Relative %: 63.9 %
Platelets: 413 10*3/uL — ABNORMAL HIGH (ref 140–400)
RBC: 3.64 10*6/uL — ABNORMAL LOW (ref 3.80–5.10)
RDW: 15.8 % — ABNORMAL HIGH (ref 11.0–15.0)
Total Lymphocyte: 24.9 %
WBC: 9.4 10*3/uL (ref 3.8–10.8)

## 2019-04-05 LAB — LIPID PANEL
Cholesterol: 103 mg/dL (ref <200)
HDL: 35 mg/dL — ABNORMAL LOW (ref >=50)
LDL Cholesterol (Calc): 53 mg/dL (calc)
Non-HDL Cholesterol (Calc): 68 mg/dL (calc) (ref <130)
Total CHOL/HDL Ratio: 2.9 (calc) (ref <5.0)
Triglycerides: 70 mg/dL (ref <150)

## 2019-04-05 LAB — HEMOGLOBIN A1C
Hgb A1c MFr Bld: 5.8 % of total Hgb — ABNORMAL HIGH (ref <5.7)
Mean Plasma Glucose: 120 (calc)
eAG (mmol/L): 6.6 (calc)

## 2019-04-05 LAB — COMPLETE METABOLIC PANEL WITH GFR
AG Ratio: 1.4 (calc) (ref 1.0–2.5)
ALT: 12 U/L (ref 6–29)
AST: 14 U/L (ref 10–35)
Albumin: 4.2 g/dL (ref 3.6–5.1)
Alkaline phosphatase (APISO): 73 U/L (ref 37–153)
BUN/Creatinine Ratio: 19 (calc) (ref 6–22)
BUN: 34 mg/dL — ABNORMAL HIGH (ref 7–25)
CO2: 27 mmol/L (ref 20–32)
Calcium: 9.6 mg/dL (ref 8.6–10.4)
Chloride: 104 mmol/L (ref 98–110)
Creat: 1.83 mg/dL — ABNORMAL HIGH (ref 0.60–0.93)
GFR, Est African American: 31 mL/min/{1.73_m2} — ABNORMAL LOW (ref >=60)
GFR, Est Non African American: 27 mL/min/{1.73_m2} — ABNORMAL LOW (ref >=60)
Globulin: 2.9 g/dL (calc) (ref 1.9–3.7)
Glucose, Bld: 87 mg/dL (ref 65–99)
Potassium: 4.6 mmol/L (ref 3.5–5.3)
Sodium: 142 mmol/L (ref 135–146)
Total Bilirubin: 0.7 mg/dL (ref 0.2–1.2)
Total Protein: 7.1 g/dL (ref 6.1–8.1)

## 2019-04-05 LAB — TSH: TSH: 2.35 mIU/L (ref 0.40–4.50)

## 2019-04-05 LAB — MAGNESIUM: Magnesium: 2.3 mg/dL (ref 1.5–2.5)

## 2019-04-18 ENCOUNTER — Other Ambulatory Visit: Payer: Self-pay | Admitting: Internal Medicine

## 2019-04-18 DIAGNOSIS — E039 Hypothyroidism, unspecified: Secondary | ICD-10-CM

## 2019-04-18 MED ORDER — LEVOTHYROXINE SODIUM 125 MCG PO TABS: ORAL_TABLET | ORAL | 1 refills | Status: DC

## 2019-04-19 ENCOUNTER — Other Ambulatory Visit: Payer: Self-pay

## 2019-04-19 ENCOUNTER — Encounter (INDEPENDENT_AMBULATORY_CARE_PROVIDER_SITE_OTHER): Payer: Medicare Other | Admitting: Ophthalmology

## 2019-04-19 DIAGNOSIS — H34812 Central retinal vein occlusion, left eye, with macular edema: Secondary | ICD-10-CM | POA: Diagnosis not present

## 2019-04-19 DIAGNOSIS — I10 Essential (primary) hypertension: Secondary | ICD-10-CM | POA: Diagnosis not present

## 2019-04-19 DIAGNOSIS — H35033 Hypertensive retinopathy, bilateral: Secondary | ICD-10-CM | POA: Diagnosis not present

## 2019-04-19 DIAGNOSIS — H43813 Vitreous degeneration, bilateral: Secondary | ICD-10-CM | POA: Diagnosis not present

## 2019-05-31 ENCOUNTER — Other Ambulatory Visit: Payer: Self-pay

## 2019-05-31 ENCOUNTER — Encounter (INDEPENDENT_AMBULATORY_CARE_PROVIDER_SITE_OTHER): Payer: Medicare Other | Admitting: Ophthalmology

## 2019-05-31 DIAGNOSIS — H34812 Central retinal vein occlusion, left eye, with macular edema: Secondary | ICD-10-CM | POA: Diagnosis not present

## 2019-05-31 DIAGNOSIS — H35033 Hypertensive retinopathy, bilateral: Secondary | ICD-10-CM | POA: Diagnosis not present

## 2019-05-31 DIAGNOSIS — I10 Essential (primary) hypertension: Secondary | ICD-10-CM

## 2019-05-31 DIAGNOSIS — H43813 Vitreous degeneration, bilateral: Secondary | ICD-10-CM

## 2019-06-14 LAB — HM DIABETES EYE EXAM

## 2019-06-15 ENCOUNTER — Other Ambulatory Visit: Payer: Self-pay | Admitting: *Deleted

## 2019-06-15 ENCOUNTER — Encounter: Payer: Self-pay | Admitting: *Deleted

## 2019-06-15 ENCOUNTER — Other Ambulatory Visit: Payer: Self-pay | Admitting: Internal Medicine

## 2019-06-15 MED ORDER — ALLOPURINOL 300 MG PO TABS: 150.0000 mg | ORAL_TABLET | Freq: Every day | ORAL | 1 refills | Status: DC

## 2019-06-20 ENCOUNTER — Other Ambulatory Visit: Payer: Self-pay | Admitting: Internal Medicine

## 2019-06-21 ENCOUNTER — Other Ambulatory Visit: Payer: Self-pay | Admitting: Adult Health

## 2019-07-12 ENCOUNTER — Encounter (INDEPENDENT_AMBULATORY_CARE_PROVIDER_SITE_OTHER): Payer: Medicare Other | Admitting: Ophthalmology

## 2019-07-12 ENCOUNTER — Other Ambulatory Visit: Payer: Self-pay

## 2019-07-12 DIAGNOSIS — H43813 Vitreous degeneration, bilateral: Secondary | ICD-10-CM | POA: Diagnosis not present

## 2019-07-12 DIAGNOSIS — H34812 Central retinal vein occlusion, left eye, with macular edema: Secondary | ICD-10-CM | POA: Diagnosis not present

## 2019-07-12 DIAGNOSIS — H35033 Hypertensive retinopathy, bilateral: Secondary | ICD-10-CM

## 2019-07-12 DIAGNOSIS — I10 Essential (primary) hypertension: Secondary | ICD-10-CM

## 2019-08-03 DIAGNOSIS — N2581 Secondary hyperparathyroidism of renal origin: Secondary | ICD-10-CM | POA: Insufficient documentation

## 2019-08-03 NOTE — Progress Notes (Signed)
Complete Physical  Assessment and Plan:  Diagnoses and all orders for this visit:  Encounter for routine adult health examination with abnormal findings  Essential hypertension Continue medication Monitor blood pressure at home; call if consistently over 130/80 Continue DASH diet.   Reminder to go to the ER if any CP, SOB, nausea, dizziness, severe HA, changes vision/speech, left arm numbness and tingling and jaw pain.  OSA (obstructive sleep apnea) Wears CPAP inconsistently, emphasized adherence Reviewed risks of untreated sleep apnea No reported barriers or equipment needs  Hyperlipidemia associated with type 2 diabetes ([place]) Currently statin deferred due to fair control off of medications Continue low cholesterol diet and exercise.  Check lipid panel.   Hypothyroidism, unspecified type continue medications the same pending lab results reminded to take on an empty stomach 30-56mns before food.  check TSH level  Type 2 diabetes mellitus with stage 2 chronic kidney disease, without long-term current use of insulin ([facility] Education: Reviewed 'ABCs' of diabetes management (respective goals in parentheses):  A1C (<7), blood pressure (<130/80), and cholesterol (LDL <70) Eye Exam yearly and Dental Exam every 6 months. 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 Monitor Ca+, renal functions, bone density   Vitamin D deficiency At goal at recent check; continue to recommend supplementation for goal of 70-100 Defer vitamin D level  Obesity, morbid ([place] discussion about weight loss, diet, and exercise Recommended diet heavy in fruits and veggies and low in animal meats, cheeses, and dairy  products, appropriate calorie intake Patient will work on restart weight watchers, silver sneakers (water aerobics) Discussed appropriate weight for height and initial goal (240 lb) Follow up at next visit  Macular degeneration, unspecified laterality, unspecified type Followed by ophthalmology  Hyperlipidemia Currently not treated secondary to at goal without medications Continue low cholesterol diet and exercise.  Check lipid panel.   Chronic gout without tophus, unspecified cause, unspecified site Continue allopurinol, colchicine per ortho Diet discussed Check uric acid   Generalized anxiety disorder Recently well controlled; uses benzo PRN prior to eye injections Stress management techniques discussed, increase water, good sleep hygiene discussed, increase exercise, and increase veggies.   Anemia, unspecified type Normal iron, B12, likely anemia of chronic disease secondary to renal function On iron supplement despite normal iron study - advised to stop Recheck B12 now on supplement then PRN only    Murmur Aortic calcification with EF preserved per ECHO 05/2018 Monitor Weight loss encouraged  Discussed med's effects and SE's. Screening labs and tests as requested with regular follow-up as recommended. Over 40 minutes of exam, counseling, chart review, and complex, high level critical decision making was performed this visit.   Future Appointments  Date Time Provider [place] 08/23/2019  8:30 AM MHayden Pedro MD [facility] None  12/21/2019 11:15 AM CLiane Comber NP [facility] None  08/06/2020  9:00 AM CLiane Comber NP [facility] None     HPI  73y.o. female  presents for a complete physical and follow up for has Essential hypertension; Type 2 diabetes mellitus with stage 3 chronic kidney disease, with long-term current use of insulin ([place]; Vitamin D deficiency; OSA (obstructive sleep apnea); Hypothyroidism; Medication management; Macular degeneration;  Generalized anxiety disorder; Gout; Hyperlipidemia associated with type 2 diabetes mellitus ([place]; CKD stage 3 due to  type 2 diabetes mellitus ([place]); Anemia; Diabetic retinopathy associated with type 2 diabetes mellitus ([place]); Class 3 severe obesity due to excess calories with serious comorbidity and body mass index (BMI) of 45.0 to 49.9 in adult [facility]); and Hyperparathyroidism, secondary renal ([place]) on their problem list.   Married, 3 kids, 1 is hers, 1 grandchild; she is a retired Product manager.   she has a diagnosis of anxiety and is currently on valium 5 mg once daily PRN, reports symptoms are well controlled on current regimen. she takes the night before she goes to get eye injections, hasn't needed in several months.   She is following with ortho Dr. Rip Harbour at [facility] for bilateral knee pain and has been advised may need TKA at home point. She will take tylenol only if needed, uses OTC topicals and takes tumeric/bioprene supplement daily.   She has OSA on CPAP, she admits poor adherence, <50%, she is agreeable to restarting.   BMI is Body mass index is 45.36 kg/m., she has not been working on diet, limited by knee arthritis. She does have silver sneakers but hasn't been using due to covid 19. She reports she has been successful with 90# weight loss on weight watchers but doesn't feel like it has helped as much recently  Wt Readings from Last 3 Encounters:  08/04/19 248 lb (112.5 kg)  04/04/19 246 lb (111.6 kg)  02/18/19 249 lb (112.9 kg)   Her blood pressure has been controlled at home, today their BP is BP: 130/78 She does not workout. She denies chest pain, shortness of breath, dizziness.   She is not on cholesterol medication and denies myalgias. Her cholesterol is not at goal. The cholesterol last visit was:   Lab Results  Component Value Date   CHOL 103 04/04/2019   HDL 35 (L) 04/04/2019   LDLCALC 53 04/04/2019   TRIG 70 04/04/2019   CHOLHDL 2.9 04/04/2019    She has not been working on diet and exercise for T2DM (on metformin 4 tabs daily, novolin 70/30 - 25 units AM, 10 units PM), she is on bASA, she is on ACE/ARB and denies foot ulcerations, hyperglycemia, hypoglycemia , increased appetite, nausea, paresthesia of the feet, polydipsia, polyuria, visual disturbances, vomiting and weight loss. She reports fasting ranges 110-120s. Last A1C in the office was:  Lab Results  Component Value Date   HGBA1C 5.8 (H) 04/04/2019   She is on thyroid medication. Her medication was not changed last visit.   Lab Results  Component Value Date   TSH 2.35 04/04/2019   Last GFR: Lab Results  Component Value Date   GFRAA 31 (L) 04/04/2019  She is followed by nephrology Dr. Posey Pronto and [facility].    She has hyperparathyroid secondary to renal function decline:  Lab Results  Component Value Date   PTH 105 (H) 01/04/2019   CALCIUM 9.6 04/04/2019   Patient is on allopurinol for gout and does report a recent flare, was instructed to start taking colchicine daily by ortho and has done well since.  Lab Results  Component Value Date   LABURIC 5.4 09/24/2018   Patient is on Vitamin D supplement.   Lab Results  Component Value Date   VD25OH 85 04/04/2019      Lab Results  Component Value Date   WBC 9.4 04/04/2019   HGB 10.2 (L) 04/04/2019   HCT 31.0 (L) 04/04/2019   MCV 85.2 04/04/2019   PLT 413 (H) 04/04/2019   On iron supplement  daily,  Lab Results  Component Value Date   IRON 60 05/27/2018   TIBC 295 05/27/2018   FERRITIN 251 05/27/2018   Lab Results  Component Value Date   [id number] 267 06/14/2018     Current Medications:  Current Outpatient Medications on File Prior to Visit  Medication Sig Dispense Refill  . allopurinol (ZYLOPRIM) 300 MG tablet Take 0.5 tablets (150 mg total) by mouth daily. 90 tablet 1  . aspirin EC 81 MG tablet Take 81 mg by mouth 2 (two) times daily.    Marland Kitchen atenolol (TENORMIN) 100 MG tablet Take 1 tablet  Daily for BP 90 tablet 1  . BESIVANCE 0.6 % SUSP Place 1 drop into the left eye 4 (four) times daily.     . Blood Glucose Monitoring Suppl (ONE TOUCH ULTRA 2) w/Device KIT check blood sugar 3 times daily-DX-E10.9 1 each 0  . Cholecalciferol (VITAMIN D3) 5000 units CAPS Take 10,000 Units by mouth daily.    . colchicine 0.6 MG tablet TAKE 1 TABLET BY MOUTH ONCE DAILY 90 tablet 0  . ferrous fumarate (HEMOCYTE - 106 MG FE) 325 (106 Fe) MG TABS tablet Take 325 mg of iron by mouth daily.     . furosemide (LASIX) 80 MG tablet Take 1 tablet once daily for blood pressure and swelling. 180 tablet 1  . glucose blood (ONE TOUCH ULTRA TEST) test strip CHECK BLOOD SUGAR 3 TIMES A DAY 300 each 3  . insulin NPH-regular Human (NOVOLIN 70/30) (70-30) 100 UNIT/ML injection Inject into the skin. Patient injects 25 units in the AM and 15 units in the PM.    . Lancets (ONETOUCH ULTRASOFT) lancets Use as instructed Dx. Code: E11.22 100 each 12  . Lancets (ONETOUCH ULTRASOFT) lancets Check blood sugar 3 times daily-DX-E10.9 300 each 1  . levothyroxine (SYNTHROID) 125 MCG tablet Take 1 tablet daily on an empty stomach with only water for 30 minutes & no Antacid meds, Calcium or Magnesium for 4 hours & avoid Biotin 90 tablet 1  . losartan (COZAAR) 100 MG tablet 1/2-1 tablet daily 90 tablet 1  . potassium chloride (K-DUR) 10 MEQ tablet Take 21 tablet Daily 90 tablet 3  . prednisoLONE acetate (PRED FORTE) 1 % ophthalmic suspension INSTILL 1 DROP INTO EACH EYE TWICE DAILY 5 mL 0  . rosuvastatin (CRESTOR) 5 MG tablet Take 1 tab by mouth in the evening once a week for cholesterol. 30 tablet 1  . hyoscyamine (LEVSIN, ANASPAZ) 0.125 MG tablet Take 1 tablet (0.125 mg total) by mouth every 6 (six) hours as needed. (Patient not taking: Reported on 08/04/2019) 60 tablet 1   No current facility-administered medications on file prior to visit.    Allergies:  Allergies  Allergen Reactions  . Penicillins Swelling  . Sulfa  Antibiotics Itching and Rash   Medical History:  She has Essential hypertension; Type 2 diabetes mellitus with stage 3 chronic kidney disease, with long-term current use of insulin ([place]); Vitamin D deficiency; OSA (obstructive sleep apnea); Hypothyroidism; Medication management; Macular degeneration; Generalized anxiety disorder; Gout; Hyperlipidemia associated with type 2 diabetes mellitus ([place]); CKD stage 3 due to type 2 diabetes mellitus ([place]); Anemia; Diabetic retinopathy associated with type 2 diabetes mellitus ([place]); Class 3 severe obesity due to excess calories with serious comorbidity and body mass index (BMI) of 45.0 to 49.9 in adult [facility]); and Hyperparathyroidism, secondary renal ([place]) on their problem list. Health Maintenance:   Immunization History  Administered Date(s) Administered  . DT 06/19/2015  . DTaP  12/09/2004  . Pneumococcal Conjugate-13 12/19/2014  . Pneumococcal Polysaccharide-23 03/02/2009, 01/17/2019   Last colonoscopy: 03/2014 normal Last mammogram: 04/2018, CAT A, patient will schedule Last pap smear/pelvic exam: 2012, DONE DEXA: 11/2015 normal Carotid US 2013 CXR 2009  ECHO: 06/29/2018 - Aortic valve: Moderate focal calcification involving the left   coronary cusp. EF 45-40%, grade 1 diastolic dysfunction  Prior vaccinations: TD or Tdap: 2016 Influenza: declines Pneumococcal: 2010, 2019 Prevnar 13: 2015 Shingles/Zostavax: declines  Eye exam Dr. Zigmund Daniel 06/14/2019 Report received and abstracted Dental: Dr. Delma Freeze, last exam a few years ago, will schedule  Foot exam: 08/04/2019  Patient Care Team: [name], MD as PCP - General (Internal Medicine) Warden Fillers, MD as Consulting Physician (Optometry) [name], MD as Consulting Physician (Ophthalmology) Inda Castle, MD (Inactive) as Consulting Physician (Gastroenterology) Druscilla Brownie, MD as Consulting Physician (Dermatology) Allyn Kenner, MD (Dermatology)  Surgical  History:  She has a past surgical history that includes  eye procedure; Tubal ligation; and Dental examination under anesthesia. Family History:  Herfamily history includes Breast cancer in her mother; Diabetes in her maternal grandmother and sister; Hypertension in her sister; Renal Disease in her mother. Social History:  She reports that she has never smoked. She has never used smokeless tobacco. She reports current alcohol use. She reports that she does not use drugs.  Review of Systems: Review of Systems  Constitutional: Negative for malaise/fatigue and weight loss.  HENT: Negative for hearing loss and tinnitus.   Eyes: Negative for blurred vision and double vision.  Respiratory: Negative for cough, sputum production, shortness of breath and wheezing.   Cardiovascular: Negative for chest pain, palpitations, orthopnea, claudication, leg swelling and PND.  Gastrointestinal: Negative for abdominal pain, blood in stool, constipation, diarrhea, heartburn, melena, nausea and vomiting.  Genitourinary: Negative.   Musculoskeletal: Positive for joint pain (bil knees). Negative for falls and myalgias.  Skin: Negative for rash.  Neurological: Negative for dizziness, tingling, sensory change, weakness and headaches.  Endo/Heme/Allergies: Negative for polydipsia.  Psychiatric/Behavioral: Negative.  Negative for depression, memory loss, substance abuse and suicidal ideas. The patient is not nervous/anxious and does not have insomnia.   All other systems reviewed and are negative.   Physical Exam: Estimated body mass index is 45.36 kg/m as calculated from the following:   Height as of this encounter: '5\' 2"'$  (1.575 m).   Weight as of this encounter: 248 lb (112.5 kg). BP 130/78   Pulse 75   Temp (!) 97.3 F (36.3 C)   Ht '5\' 2"'$  (1.575 m)   Wt 248 lb (112.5 kg)   SpO2 98%   BMI 45.36 kg/m  General Appearance: Well nourished, in no apparent distress.  Eyes: PERRLA, EOMs, conjunctiva no  swelling or erythema, normal fundi and vessels.  Sinuses: No Frontal/maxillary tenderness  ENT/Mouth: Ext aud canals clear, normal light reflex with TMs without erythema, bulging. Good dentition. No erythema, swelling, or exudate on post pharynx. Tonsils not swollen or erythematous. Hearing normal.  Neck: Supple, thyroid normal. No bruits  Respiratory: Respiratory effort normal, BS equal bilaterally without rales, rhonchi, wheezing or stridor.  Cardio: RRR withou, rubs or gallops, no audible murmur. Symmetrical peripheral pulses without edema after removal of compression hose Chest: symmetric, with normal excursions and percussion.  Breasts: Symmetric, without lumps, nipple discharge, retractions.  Abdomen: Soft, obese, nontender, no guarding, rebound, hernias, masses, or organomegaly.  Lymphatics: Non tender without lymphadenopathy.  Genitourinary: Defer Musculoskeletal: Full ROM all peripheral extremities,5/5 strength, and antalgic gait.  Skin:  Warm, dry without rashes, lesions, ecchymosis. Neuro: Cranial nerves intact, reflexes equal bilaterally. Normal muscle tone, no cerebellar symptoms. Sensation intact.  Psych: Awake and oriented X 3, normal affect, Insight and Judgment appropriate.   EKG: No ST changes, left ventricular hypertrophy by voltage unchanged from previous  No hypertrophy per ECHO 06/29/2018  Izora Ribas 9:31 AM [facility]

## 2019-08-04 ENCOUNTER — Ambulatory Visit (INDEPENDENT_AMBULATORY_CARE_PROVIDER_SITE_OTHER): Payer: Medicare Other | Admitting: Adult Health

## 2019-08-04 ENCOUNTER — Other Ambulatory Visit: Payer: Self-pay

## 2019-08-04 ENCOUNTER — Encounter: Payer: Self-pay | Admitting: Adult Health

## 2019-08-04 VITALS — BP 130/78 | HR 75 | Temp 97.3°F | Ht 62.0 in | Wt 248.0 lb

## 2019-08-04 DIAGNOSIS — Z79899 Other long term (current) drug therapy: Secondary | ICD-10-CM

## 2019-08-04 DIAGNOSIS — E1122 Type 2 diabetes mellitus with diabetic chronic kidney disease: Secondary | ICD-10-CM

## 2019-08-04 DIAGNOSIS — E039 Hypothyroidism, unspecified: Secondary | ICD-10-CM

## 2019-08-04 DIAGNOSIS — E559 Vitamin D deficiency, unspecified: Secondary | ICD-10-CM

## 2019-08-04 DIAGNOSIS — E11319 Type 2 diabetes mellitus with unspecified diabetic retinopathy without macular edema: Secondary | ICD-10-CM

## 2019-08-04 DIAGNOSIS — Z0000 Encounter for general adult medical examination without abnormal findings: Secondary | ICD-10-CM

## 2019-08-04 DIAGNOSIS — G4733 Obstructive sleep apnea (adult) (pediatric): Secondary | ICD-10-CM

## 2019-08-04 DIAGNOSIS — H3530 Unspecified macular degeneration: Secondary | ICD-10-CM

## 2019-08-04 DIAGNOSIS — D649 Anemia, unspecified: Secondary | ICD-10-CM

## 2019-08-04 DIAGNOSIS — I10 Essential (primary) hypertension: Secondary | ICD-10-CM

## 2019-08-04 DIAGNOSIS — Z136 Encounter for screening for cardiovascular disorders: Secondary | ICD-10-CM | POA: Diagnosis not present

## 2019-08-04 DIAGNOSIS — F411 Generalized anxiety disorder: Secondary | ICD-10-CM

## 2019-08-04 DIAGNOSIS — E66813 Obesity, class 3: Secondary | ICD-10-CM

## 2019-08-04 DIAGNOSIS — Z0001 Encounter for general adult medical examination with abnormal findings: Secondary | ICD-10-CM

## 2019-08-04 DIAGNOSIS — M1A9XX0 Chronic gout, unspecified, without tophus (tophi): Secondary | ICD-10-CM

## 2019-08-04 DIAGNOSIS — N2581 Secondary hyperparathyroidism of renal origin: Secondary | ICD-10-CM

## 2019-08-04 DIAGNOSIS — E785 Hyperlipidemia, unspecified: Secondary | ICD-10-CM

## 2019-08-04 DIAGNOSIS — Z6841 Body Mass Index (BMI) 40.0 and over, adult: Secondary | ICD-10-CM

## 2019-08-04 DIAGNOSIS — E1169 Type 2 diabetes mellitus with other specified complication: Secondary | ICD-10-CM

## 2019-08-04 DIAGNOSIS — N183 Chronic kidney disease, stage 3 unspecified: Secondary | ICD-10-CM

## 2019-08-04 NOTE — Patient Instructions (Addendum)
Ms. Hairgrove , Thank you for taking time to come for your Annual Wellness Visit. I appreciate your ongoing commitment to your health goals. Please review the following plan we discussed and let me know if I can assist you in the future.   These are the goals we discussed: Goals    . Exercise 3x per week (30 min per time)    . Weight (lb) < 240 lb (108.9 kg)       This is a list of the screening recommended for you and due dates:  Health Maintenance  Topic Date Due  . Complete foot exam   06/15/2019  . Flu Shot  07/23/2019  . Hemoglobin A1C  10/04/2019  . Mammogram  04/21/2020  . Eye exam for diabetics  06/13/2020  . Tetanus Vaccine  02/27/2024  . Colon Cancer Screening  04/17/2024  . DEXA scan (bone density measurement)  Completed  .  Hepatitis C: One time screening is recommended by Center for Disease Control  (CDC) for  adults born from [birth year] through 1965.   Completed  . Pneumonia vaccines  Completed       Iron levels look good - please stop taking iron supplement  Please schedule an appointment with Dr. Delma Freeze     Drink 1/2 your body weight in fluid ounces of water daily; drink a tall glass of water 30 min before meals  Don't eat until you're stuffed- listen to your stomach and eat until you are 80% full   Try eating off of a salad plate; wait 10 min after finishing before going back for seconds  Start by eating the vegetables on your plate; aim for 50% of your meals to be fruits or vegetables  Then eat your protein - lean meats (grass fed if possible), fish, beans, nuts in moderation  Eat your carbs/starch last ONLY if you still are hungry. If you can, stop before finishing it all  Avoid sugar and flour - the closer it looks to it's original form in nature, typically the better it is for you  Splurge in moderation - "assign" days when you get to splurge and have the "bad stuff" - I like to follow a 80% - 20% plan- "good" choices 80 % of the time, "bad" choices in  moderation 20% of the time  Simple equation is: Calories out > calories in = weight loss - even if you eat the bad stuff, if you limit portions, you will still lose weight      Try to be consistent wearing CPAP machine         When it comes to diets, agreement about the perfect plan isn't easy to find, even among the experts. Experts at the [employer] developed an idea known as the Healthy Eating Plate. Just imagine a plate divided into logical, healthy portions.  The emphasis is on diet quality:  Load up on vegetables and fruits - one-half of your plate: Aim for color and variety, and remember that potatoes don't count.  Go for whole grains - one-quarter of your plate: Whole wheat, barley, wheat berries, quinoa, oats, brown rice, and foods made with them. If you want pasta, go with whole wheat pasta.  Protein power - one-quarter of your plate: Fish, chicken, beans, and nuts are all healthy, versatile protein sources. Limit red meat.  The diet, however, does go beyond the plate, offering a few other suggestions.  Use healthy plant oils, such as olive, canola, soy, corn,  sunflower and peanut. Check the labels, and avoid partially hydrogenated oil, which have unhealthy trans fats.  If you're thirsty, drink water. Coffee and tea are good in moderation, but skip sugary drinks and limit milk and dairy products to one or two daily servings.  The type of carbohydrate in the diet is more important than the amount. Some sources of carbohydrates, such as vegetables, fruits, whole grains, and beans-are healthier than others.  Finally, stay active.

## 2019-08-05 LAB — COMPLETE METABOLIC PANEL WITH GFR
AG Ratio: 1.4 (calc) (ref 1.0–2.5)
ALT: 11 U/L (ref 6–29)
AST: 15 U/L (ref 10–35)
Albumin: 4.1 g/dL (ref 3.6–5.1)
Alkaline phosphatase (APISO): 78 U/L (ref 37–153)
BUN/Creatinine Ratio: 20 (calc) (ref 6–22)
BUN: 27 mg/dL — ABNORMAL HIGH (ref 7–25)
CO2: 28 mmol/L (ref 20–32)
Calcium: 9.6 mg/dL (ref 8.6–10.4)
Chloride: 103 mmol/L (ref 98–110)
Creat: 1.38 mg/dL — ABNORMAL HIGH (ref 0.60–0.93)
GFR, Est African American: 44 mL/min/{1.73_m2} — ABNORMAL LOW (ref >=60)
GFR, Est Non African American: 38 mL/min/{1.73_m2} — ABNORMAL LOW (ref >=60)
Globulin: 2.9 g/dL (calc) (ref 1.9–3.7)
Glucose, Bld: 60 mg/dL — ABNORMAL LOW (ref 65–99)
Potassium: 3.8 mmol/L (ref 3.5–5.3)
Sodium: 141 mmol/L (ref 135–146)
Total Bilirubin: 0.7 mg/dL (ref 0.2–1.2)
Total Protein: 7 g/dL (ref 6.1–8.1)

## 2019-08-05 LAB — URINALYSIS, ROUTINE W REFLEX MICROSCOPIC
Bilirubin Urine: NEGATIVE
Glucose, UA: NEGATIVE
Hgb urine dipstick: NEGATIVE
Hyaline Cast: NONE SEEN /LPF
Ketones, ur: NEGATIVE
Nitrite: POSITIVE — AB
Protein, ur: NEGATIVE
Specific Gravity, Urine: 1.013 (ref 1.001–1.03)
pH: <=5 (ref 5.0–8.0)

## 2019-08-05 LAB — CBC WITH DIFFERENTIAL/PLATELET
Absolute Monocytes: 584 cells/uL (ref 200–950)
Basophils Absolute: 72 cells/uL (ref 0–200)
Basophils Relative: 0.9 %
Eosinophils Absolute: 200 cells/uL (ref 15–500)
Eosinophils Relative: 2.5 %
HCT: 33.9 % — ABNORMAL LOW (ref 35.0–45.0)
Hemoglobin: 11.1 g/dL — ABNORMAL LOW (ref 11.7–15.5)
Lymphs Abs: 2688 cells/uL (ref 850–3900)
MCH: 28.8 pg (ref 27.0–33.0)
MCHC: 32.7 g/dL (ref 32.0–36.0)
MCV: 87.8 fL (ref 80.0–100.0)
MPV: 12.8 fL — ABNORMAL HIGH (ref 7.5–12.5)
Monocytes Relative: 7.3 %
Neutro Abs: 4456 cells/uL (ref 1500–7800)
Neutrophils Relative %: 55.7 %
Platelets: 383 10*3/uL (ref 140–400)
RBC: 3.86 10*6/uL (ref 3.80–5.10)
RDW: 15.1 % — ABNORMAL HIGH (ref 11.0–15.0)
Total Lymphocyte: 33.6 %
WBC: 8 10*3/uL (ref 3.8–10.8)

## 2019-08-05 LAB — HEMOGLOBIN A1C
Hgb A1c MFr Bld: 5.7 % of total Hgb — ABNORMAL HIGH (ref <5.7)
Mean Plasma Glucose: 117 (calc)
eAG (mmol/L): 6.5 (calc)

## 2019-08-05 LAB — MICROALBUMIN / CREATININE URINE RATIO
Creatinine, Urine: 120 mg/dL (ref 20–275)
Microalb Creat Ratio: 15 mcg/mg creat (ref <30)
Microalb, Ur: 1.8 mg/dL

## 2019-08-05 LAB — LIPID PANEL
Cholesterol: 117 mg/dL (ref <200)
HDL: 38 mg/dL — ABNORMAL LOW (ref >=50)
LDL Cholesterol (Calc): 59 mg/dL (calc)
Non-HDL Cholesterol (Calc): 79 mg/dL (calc) (ref <130)
Total CHOL/HDL Ratio: 3.1 (calc) (ref <5.0)
Triglycerides: 114 mg/dL (ref <150)

## 2019-08-05 LAB — MAGNESIUM: Magnesium: 2.1 mg/dL (ref 1.5–2.5)

## 2019-08-05 LAB — VITAMIN B12: Vitamin B-12: >2000 pg/mL — ABNORMAL HIGH (ref 200–1100)

## 2019-08-05 LAB — TSH: TSH: 4.59 mIU/L — ABNORMAL HIGH (ref 0.40–4.50)

## 2019-08-19 ENCOUNTER — Other Ambulatory Visit: Payer: Self-pay | Admitting: Internal Medicine

## 2019-08-23 ENCOUNTER — Other Ambulatory Visit: Payer: Self-pay

## 2019-08-23 ENCOUNTER — Encounter (INDEPENDENT_AMBULATORY_CARE_PROVIDER_SITE_OTHER): Payer: Medicare Other | Admitting: Ophthalmology

## 2019-08-23 DIAGNOSIS — H2511 Age-related nuclear cataract, right eye: Secondary | ICD-10-CM | POA: Diagnosis not present

## 2019-08-23 DIAGNOSIS — H35033 Hypertensive retinopathy, bilateral: Secondary | ICD-10-CM

## 2019-08-23 DIAGNOSIS — H34812 Central retinal vein occlusion, left eye, with macular edema: Secondary | ICD-10-CM

## 2019-08-23 DIAGNOSIS — I10 Essential (primary) hypertension: Secondary | ICD-10-CM

## 2019-09-01 ENCOUNTER — Other Ambulatory Visit: Payer: Self-pay | Admitting: Internal Medicine

## 2019-09-01 DIAGNOSIS — Z1231 Encounter for screening mammogram for malignant neoplasm of breast: Secondary | ICD-10-CM

## 2019-09-12 ENCOUNTER — Ambulatory Visit
Admission: RE | Admit: 2019-09-12 | Discharge: 2019-09-12 | Disposition: A | Discharge status: Home / Self Care | Payer: Medicare Other | Source: Ambulatory Visit | Attending: Internal Medicine | Admitting: Internal Medicine

## 2019-09-12 ENCOUNTER — Other Ambulatory Visit: Payer: Self-pay

## 2019-09-12 DIAGNOSIS — Z1231 Encounter for screening mammogram for malignant neoplasm of breast: Secondary | ICD-10-CM

## 2019-09-19 ENCOUNTER — Other Ambulatory Visit: Payer: Self-pay | Admitting: Internal Medicine

## 2019-09-19 DIAGNOSIS — M1A9XX0 Chronic gout, unspecified, without tophus (tophi): Secondary | ICD-10-CM

## 2019-09-19 MED ORDER — COLCHICINE 0.6 MG PO TABS: ORAL_TABLET | ORAL | 3 refills | Status: DC

## 2019-10-04 ENCOUNTER — Other Ambulatory Visit: Payer: Self-pay

## 2019-10-04 ENCOUNTER — Encounter (INDEPENDENT_AMBULATORY_CARE_PROVIDER_SITE_OTHER): Payer: Medicare Other | Admitting: Ophthalmology

## 2019-10-04 DIAGNOSIS — H34812 Central retinal vein occlusion, left eye, with macular edema: Secondary | ICD-10-CM | POA: Diagnosis not present

## 2019-10-04 DIAGNOSIS — H35033 Hypertensive retinopathy, bilateral: Secondary | ICD-10-CM

## 2019-10-04 DIAGNOSIS — I10 Essential (primary) hypertension: Secondary | ICD-10-CM

## 2019-10-04 DIAGNOSIS — H43813 Vitreous degeneration, bilateral: Secondary | ICD-10-CM

## 2019-10-19 ENCOUNTER — Other Ambulatory Visit: Payer: Self-pay | Admitting: Physician Assistant

## 2019-10-28 ENCOUNTER — Other Ambulatory Visit: Payer: Self-pay | Admitting: Internal Medicine

## 2019-10-28 DIAGNOSIS — E039 Hypothyroidism, unspecified: Secondary | ICD-10-CM

## 2019-10-28 MED ORDER — ALLOPURINOL 300 MG PO TABS: ORAL_TABLET | ORAL | 1 refills | Status: DC

## 2019-10-28 MED ORDER — LEVOTHYROXINE SODIUM 125 MCG PO TABS: ORAL_TABLET | ORAL | 3 refills | Status: DC

## 2019-11-14 ENCOUNTER — Ambulatory Visit: Payer: Medicare Other | Admitting: Internal Medicine

## 2019-11-15 ENCOUNTER — Encounter (INDEPENDENT_AMBULATORY_CARE_PROVIDER_SITE_OTHER): Payer: Medicare Other | Admitting: Ophthalmology

## 2019-11-15 DIAGNOSIS — H34812 Central retinal vein occlusion, left eye, with macular edema: Secondary | ICD-10-CM | POA: Diagnosis not present

## 2019-11-15 DIAGNOSIS — H43813 Vitreous degeneration, bilateral: Secondary | ICD-10-CM

## 2019-11-15 DIAGNOSIS — H35033 Hypertensive retinopathy, bilateral: Secondary | ICD-10-CM | POA: Diagnosis not present

## 2019-11-15 DIAGNOSIS — I10 Essential (primary) hypertension: Secondary | ICD-10-CM

## 2019-11-22 ENCOUNTER — Encounter: Payer: Self-pay | Admitting: Internal Medicine

## 2019-11-22 NOTE — Patient Instructions (Signed)

## 2019-11-22 NOTE — Progress Notes (Signed)
History of Present Illness:      This very nice 73 y.o. MBF presents for 3 month follow up with HTN, HLD, Morbid Obesity / Ins requiring T2_DM   and Vitamin D Deficiency.  Patient has hx/o Gout controlled on her meds. Patient has CPAP which she infrequently uses.       Patient is treated for HTN  Since 1994 & BP has been controlled at home. Today's BP is at goal - 124/82. Patient has had no complaints of any cardiac type chest pain, palpitations, dyspnea / orthopnea / PND, dizziness, claudication, or dependent edema.      Hyperlipidemia is controlled with diet & meds. Patient denies myalgias or other med SE's. Last Lipids were at goal:  Lab Results  Component Value Date   CHOL 117 08/04/2019   HDL 38 (L) 08/04/2019   LDLCALC 59 08/04/2019   TRIG 114 08/04/2019   CHOLHDL 3.1 08/04/2019        Also, the patient has Gluttony and  Morbid Obesity (BMI 45+) and T2_IDDM (2007) with CKD 3 (GFR 33) . Patient denies symptoms of reactive hypoglycemia, diabetic polys, paresthesias or visual blurring.  Last A1c was near goal:  Lab Results  Component Value Date   HGBA1C 5.7 (H) 08/04/2019        Patient was dx'd Hypothyroid in 2004 & started on thyroid replacement.      Further, the patient also has history of Vitamin D Deficiency ("38" / 2016)  and supplements vitamin D without any suspected side-effects. Last vitamin D was at goal:  Lab Results  Component Value Date   VD25OH 85 04/04/2019    Current Outpatient Medications on File Prior to Visit  Medication Sig  . allopurinol (ZYLOPRIM) 300 MG tablet Take 1/2 tablet Daily to Prevent Gout  . aspirin EC 81 MG tablet Take 81 mg by mouth 2 (two) times daily.  Marland Kitchen atenolol (TENORMIN) 100 MG tablet Take 1 tablet Daily for BP  . BESIVANCE 0.6 % SUSP Place 1 drop into the left eye 4 (four) times daily.   . Blood Glucose Monitoring Suppl (ONE TOUCH ULTRA 2) w/Device KIT check blood sugar 3 times daily-DX-E10.9  . Cholecalciferol (VITAMIN D3)  5000 units CAPS Take 10,000 Units by mouth daily.  . colchicine 0.6 MG tablet Take 1 tablet Daily to Prevent Gout  . furosemide (LASIX) 80 MG tablet Take 1 tablet once daily for blood pressure and swelling. (Patient taking differently: 40 mg 2 (two) times daily. Take 1 tablet once daily for blood pressure and swelling.)  . glucose blood (ONE TOUCH ULTRA TEST) test strip CHECK BLOOD SUGAR 3 TIMES A DAY  . hyoscyamine (LEVSIN, ANASPAZ) 0.125 MG tablet Take 1 tablet (0.125 mg total) by mouth every 6 (six) hours as needed.  . insulin NPH-regular Human (NOVOLIN 70/30) (70-30) 100 UNIT/ML injection Inject into the skin. Patient injects 25 units in the AM and 10 units in the PM.   . Lancets (ONETOUCH ULTRASOFT) lancets Use as instructed Dx. Code: E11.22  . Lancets (ONETOUCH ULTRASOFT) lancets Check blood sugar 3 times daily-DX-E10.9  . levothyroxine (SYNTHROID) 125 MCG tablet Take 1 tablet daily on an empty stomach with only water for 30 minutes & no Antacid meds, Calcium or Magnesium for 4 hours & avoid Biotin  . losartan (COZAAR) 100 MG tablet TAKE 1/2 TO 1 TABLET BY MOUTH DAILY  . potassium chloride (K-DUR) 10 MEQ tablet Take 21 tablet Daily  . prednisoLONE acetate (PRED  FORTE) 1 % ophthalmic suspension INSTILL 1 DROP INTO EACH EYE TWICE A DAY  . rosuvastatin (CRESTOR) 5 MG tablet Take 1 tab by mouth in the evening once a week for cholesterol.   No current facility-administered medications on file prior to visit.     Allergies  Allergen Reactions  . Penicillins Swelling  . Sulfa Antibiotics Itching and Rash    PMHx:   Past Medical History:  Diagnosis Date  . Allergic rhinitis   . Allergy   . Anxiety    with medical procedures  . Hyperlipidemia   . Hypertension   . Obesity   . OSA (obstructive sleep apnea)    wears CPAP  . Type II or unspecified type diabetes mellitus with renal manifestations, not stated as uncontrolled(250.40)   . Unspecified hypothyroidism   . Vitamin D deficiency     Immunization History  Administered Date(s) Administered  . DT 06/19/2015  . DTaP 12/09/2004  . Pneumococcal Conjugate-13 12/19/2014  . Pneumococcal Polysaccharide-23 03/02/2009, 01/17/2019   Past Surgical History:  Procedure Laterality Date  .  eye procedure     have treatment where a needle is stuck into left eye- since June 2014; now every six weeks  . DENTAL EXAMINATION UNDER ANESTHESIA    . TUBAL LIGATION      FHx:    Reviewed / unchanged  SHx:    Reviewed / unchanged   Systems Review:  Constitutional: Denies fever, chills, wt changes, headaches, insomnia, fatigue, night sweats, change in appetite. Eyes: Denies redness, blurred vision, diplopia, discharge, itchy, watery eyes.  ENT: Denies discharge, congestion, post nasal drip, epistaxis, sore throat, earache, hearing loss, dental pain, tinnitus, vertigo, sinus pain, snoring.  CV: Denies chest pain, palpitations, irregular heartbeat, syncope, dyspnea, diaphoresis, orthopnea, PND, claudication or edema. Respiratory: denies cough, dyspnea, DOE, pleurisy, hoarseness, laryngitis, wheezing.  Gastrointestinal: Denies dysphagia, odynophagia, heartburn, reflux, water brash, abdominal pain or cramps, nausea, vomiting, bloating, diarrhea, constipation, hematemesis, melena, hematochezia  or hemorrhoids. Genitourinary: Denies dysuria, frequency, urgency, nocturia, hesitancy, discharge, hematuria or flank pain. Musculoskeletal: Denies arthralgias, myalgias, stiffness, jt. swelling, pain, limping or strain/sprain.  Skin: Denies pruritus, rash, hives, warts, acne, eczema or change in skin lesion(s). Neuro: No weakness, tremor, incoordination, spasms, paresthesia or pain. Psychiatric: Denies confusion, memory loss or sensory loss. Endo: Denies change in weight, skin or hair change.  Heme/Lymph: No excessive bleeding, bruising or enlarged lymph nodes.  Physical Exam  BP 124/82   Pulse 66   Temp (!) 97.5 F (36.4 C)   Ht '5\' 2"'$  (1.575  m)   Wt 255 lb (115.7 kg)   SpO2 98%   BMI 46.64 kg/m   Appears  over nourished, well groomed  and in no distress.  Eyes: PERRLA, EOMs, conjunctiva no swelling or erythema. Sinuses: No frontal/maxillary tenderness ENT/Mouth: EAC's clear, TM's nl w/o erythema, bulging. Nares clear w/o erythema, swelling, exudates. Oropharynx clear without erythema or exudates. Oral hygiene is good. Tongue normal, non obstructing. Hearing intact.  Neck: Supple. Thyroid not palpable. Car 2+/2+ without bruits, nodes or JVD. Chest: Respirations nl with BS clear & equal w/o rales, rhonchi, wheezing or stridor.  Cor: Heart sounds normal w/ regular rate and rhythm without sig. murmurs, gallops, clicks or rubs. Peripheral pulses normal and equal  without edema.  Abdomen: Soft & bowel sounds normal. Non-tender w/o guarding, rebound, hernias, masses or organomegaly.  Lymphatics: Unremarkable.  Musculoskeletal: Full ROM all peripheral extremities, joint stability, 5/5 strength and normal gait.  Skin: Warm, dry without exposed  rashes, lesions or ecchymosis apparent.  Neuro: Cranial nerves intact, reflexes equal bilaterally. Sensory-motor testing grossly intact. Tendon reflexes grossly intact.  Pysch: Alert & oriented x 3.  Insight and judgement nl & appropriate. No ideations.  Assessment and Plan:  1. Essential hypertension  - Continue medication, monitor blood pressure at home.  - Continue DASH diet.  Reminder to go to the ER if any CP,  SOB, nausea, dizziness, severe HA, changes vision/speech.  - CBC with Diff - COMPLETE METABOLIC PANEL WITH GFR - Magnesium - TSH  2. Hyperlipidemia associated with type 2 diabetes mellitus ([place])  - Continue diet/meds, exercise,& lifestyle modifications.  - Continue monitor periodic cholesterol/liver & renal functions   - Lipid Profile - TSH  3. Type 2 diabetes mellitus with stage 3b chronic kidney disease, with long-term current use of insulin ([facility])  - Continue diet,  exercise  - Lifestyle modifications.  - Monitor appropriate labs.  - Hemoglobin A1c ([facility])  4. Vitamin D deficiency  - Continue supplementation.  - Vitamin D (25 hydroxy)  5. Hypothyroidism  - TSH  6. Chronic gout without tophus, unspecified cause, unspecified site  - Uric acid  7. Class 3 severe obesity due to excess calories with serious comorbidity and body mass index (BMI) of 40.0 to 44.9 in adult ([place])   8. Medication management  - CBC with Diff - COMPLETE METABOLIC PANEL WITH GFR - Magnesium - Lipid Profile - TSH - Hemoglobin A1c ([facility]) - Vitamin D (25 hydroxy) - Uric acid       Discussed  regular exercise, BP monitoring, weight control to achieve/maintain BMI less than 25 and discussed med and SE's. Recommended labs to assess and monitor clinical status with further disposition pending results of labs.  I discussed the assessment and treatment plan with the patient. The patient was provided an opportunity to ask questions and all were answered. The patient agreed with the plan and demonstrated an understanding of the instructions.  I provided over 30 minutes of exam, counseling, chart review and  complex critical decision making.  [name], MD

## 2019-11-23 ENCOUNTER — Ambulatory Visit (INDEPENDENT_AMBULATORY_CARE_PROVIDER_SITE_OTHER): Payer: Medicare Other | Admitting: Internal Medicine

## 2019-11-23 ENCOUNTER — Encounter: Payer: Self-pay | Admitting: Internal Medicine

## 2019-11-23 ENCOUNTER — Other Ambulatory Visit: Payer: Self-pay

## 2019-11-23 VITALS — BP 124/82 | HR 66 | Temp 97.5°F | Ht 62.0 in | Wt 255.0 lb

## 2019-11-23 DIAGNOSIS — E1169 Type 2 diabetes mellitus with other specified complication: Secondary | ICD-10-CM

## 2019-11-23 DIAGNOSIS — I10 Essential (primary) hypertension: Secondary | ICD-10-CM | POA: Diagnosis not present

## 2019-11-23 DIAGNOSIS — E039 Hypothyroidism, unspecified: Secondary | ICD-10-CM

## 2019-11-23 DIAGNOSIS — E1121 Type 2 diabetes mellitus with diabetic nephropathy: Secondary | ICD-10-CM

## 2019-11-23 DIAGNOSIS — E1122 Type 2 diabetes mellitus with diabetic chronic kidney disease: Secondary | ICD-10-CM

## 2019-11-23 DIAGNOSIS — Z794 Long term (current) use of insulin: Secondary | ICD-10-CM

## 2019-11-23 DIAGNOSIS — E559 Vitamin D deficiency, unspecified: Secondary | ICD-10-CM

## 2019-11-23 DIAGNOSIS — M1A9XX0 Chronic gout, unspecified, without tophus (tophi): Secondary | ICD-10-CM

## 2019-11-23 DIAGNOSIS — Z6841 Body Mass Index (BMI) 40.0 and over, adult: Secondary | ICD-10-CM

## 2019-11-23 DIAGNOSIS — Z79899 Other long term (current) drug therapy: Secondary | ICD-10-CM

## 2019-11-24 LAB — CBC WITH DIFFERENTIAL/PLATELET
Absolute Monocytes: 685 cells/uL (ref 200–950)
Basophils Absolute: 77 cells/uL (ref 0–200)
Basophils Relative: 1 %
Eosinophils Absolute: 208 cells/uL (ref 15–500)
Eosinophils Relative: 2.7 %
HCT: 32.7 % — ABNORMAL LOW (ref 35.0–45.0)
Hemoglobin: 10.7 g/dL — ABNORMAL LOW (ref 11.7–15.5)
Lymphs Abs: 2764 cells/uL (ref 850–3900)
MCH: 28.5 pg (ref 27.0–33.0)
MCHC: 32.7 g/dL (ref 32.0–36.0)
MCV: 87 fL (ref 80.0–100.0)
MPV: 12.6 fL — ABNORMAL HIGH (ref 7.5–12.5)
Monocytes Relative: 8.9 %
Neutro Abs: 3966 cells/uL (ref 1500–7800)
Neutrophils Relative %: 51.5 %
Platelets: 335 10*3/uL (ref 140–400)
RBC: 3.76 10*6/uL — ABNORMAL LOW (ref 3.80–5.10)
RDW: 14.4 % (ref 11.0–15.0)
Total Lymphocyte: 35.9 %
WBC: 7.7 10*3/uL (ref 3.8–10.8)

## 2019-11-24 LAB — COMPLETE METABOLIC PANEL WITH GFR
AG Ratio: 1.4 (calc) (ref 1.0–2.5)
ALT: 16 U/L (ref 6–29)
AST: 18 U/L (ref 10–35)
Albumin: 3.9 g/dL (ref 3.6–5.1)
Alkaline phosphatase (APISO): 88 U/L (ref 37–153)
BUN/Creatinine Ratio: 18 (calc) (ref 6–22)
BUN: 26 mg/dL — ABNORMAL HIGH (ref 7–25)
CO2: 27 mmol/L (ref 20–32)
Calcium: 9.6 mg/dL (ref 8.6–10.4)
Chloride: 104 mmol/L (ref 98–110)
Creat: 1.43 mg/dL — ABNORMAL HIGH (ref 0.60–0.93)
GFR, Est African American: 42 mL/min/{1.73_m2} — ABNORMAL LOW (ref >=60)
GFR, Est Non African American: 36 mL/min/{1.73_m2} — ABNORMAL LOW (ref >=60)
Globulin: 2.8 g/dL (calc) (ref 1.9–3.7)
Glucose, Bld: 60 mg/dL — ABNORMAL LOW (ref 65–99)
Potassium: 3.9 mmol/L (ref 3.5–5.3)
Sodium: 141 mmol/L (ref 135–146)
Total Bilirubin: 0.5 mg/dL (ref 0.2–1.2)
Total Protein: 6.7 g/dL (ref 6.1–8.1)

## 2019-11-24 LAB — LIPID PANEL
Cholesterol: 104 mg/dL (ref <200)
HDL: 35 mg/dL — ABNORMAL LOW (ref >=50)
LDL Cholesterol (Calc): 52 mg/dL (calc)
Non-HDL Cholesterol (Calc): 69 mg/dL (calc) (ref <130)
Total CHOL/HDL Ratio: 3 (calc) (ref <5.0)
Triglycerides: 89 mg/dL (ref <150)

## 2019-11-24 LAB — HEMOGLOBIN A1C
Hgb A1c MFr Bld: 5.9 % of total Hgb — ABNORMAL HIGH (ref <5.7)
Mean Plasma Glucose: 123 (calc)
eAG (mmol/L): 6.8 (calc)

## 2019-11-24 LAB — URIC ACID: Uric Acid, Serum: 6.6 mg/dL (ref 2.5–7.0)

## 2019-11-24 LAB — VITAMIN D 25 HYDROXY (VIT D DEFICIENCY, FRACTURES): Vit D, 25-Hydroxy: 73 ng/mL (ref 30–100)

## 2019-11-24 LAB — MAGNESIUM: Magnesium: 2.1 mg/dL (ref 1.5–2.5)

## 2019-11-24 LAB — TSH: TSH: 1.51 mIU/L (ref 0.40–4.50)

## 2019-12-12 ENCOUNTER — Other Ambulatory Visit: Payer: Self-pay | Admitting: Adult Health

## 2019-12-14 ENCOUNTER — Ambulatory Visit: Payer: Medicare Other | Attending: Internal Medicine

## 2019-12-14 DIAGNOSIS — Z20822 Contact with and (suspected) exposure to covid-19: Secondary | ICD-10-CM

## 2019-12-16 ENCOUNTER — Other Ambulatory Visit: Payer: Self-pay | Admitting: Internal Medicine

## 2019-12-16 DIAGNOSIS — I10 Essential (primary) hypertension: Secondary | ICD-10-CM

## 2019-12-16 DIAGNOSIS — I872 Venous insufficiency (chronic) (peripheral): Secondary | ICD-10-CM

## 2019-12-17 LAB — NOVEL CORONAVIRUS, NAA: SARS-CoV-2, NAA: NOT DETECTED

## 2019-12-19 ENCOUNTER — Telehealth: Payer: Self-pay | Admitting: *Deleted

## 2019-12-19 NOTE — Telephone Encounter (Signed)
At last OV, the patient was advised to reduce the Furosemide 80 mg to daily, but she states the bottle indicates twice a day.  Patient advised to take one tablet daily, as Dr Melford Aase advised.

## 2019-12-21 ENCOUNTER — Ambulatory Visit: Payer: Self-pay | Admitting: Adult Health

## 2019-12-27 ENCOUNTER — Other Ambulatory Visit: Payer: Self-pay

## 2019-12-27 ENCOUNTER — Encounter (INDEPENDENT_AMBULATORY_CARE_PROVIDER_SITE_OTHER): Payer: Medicare Other | Admitting: Ophthalmology

## 2019-12-27 DIAGNOSIS — H35033 Hypertensive retinopathy, bilateral: Secondary | ICD-10-CM | POA: Diagnosis not present

## 2019-12-27 DIAGNOSIS — H34812 Central retinal vein occlusion, left eye, with macular edema: Secondary | ICD-10-CM

## 2019-12-27 DIAGNOSIS — H43813 Vitreous degeneration, bilateral: Secondary | ICD-10-CM

## 2019-12-27 DIAGNOSIS — I10 Essential (primary) hypertension: Secondary | ICD-10-CM

## 2019-12-27 DIAGNOSIS — H2511 Age-related nuclear cataract, right eye: Secondary | ICD-10-CM

## 2019-12-27 LAB — HM DIABETES EYE EXAM

## 2020-01-05 ENCOUNTER — Encounter: Payer: Self-pay | Admitting: Internal Medicine

## 2020-01-15 ENCOUNTER — Other Ambulatory Visit: Payer: Self-pay | Admitting: Internal Medicine

## 2020-02-07 ENCOUNTER — Encounter (INDEPENDENT_AMBULATORY_CARE_PROVIDER_SITE_OTHER): Payer: Medicare Other | Admitting: Ophthalmology

## 2020-02-07 DIAGNOSIS — H35033 Hypertensive retinopathy, bilateral: Secondary | ICD-10-CM

## 2020-02-07 DIAGNOSIS — I10 Essential (primary) hypertension: Secondary | ICD-10-CM

## 2020-02-07 DIAGNOSIS — H43813 Vitreous degeneration, bilateral: Secondary | ICD-10-CM

## 2020-02-07 DIAGNOSIS — H34812 Central retinal vein occlusion, left eye, with macular edema: Secondary | ICD-10-CM

## 2020-02-15 ENCOUNTER — Ambulatory Visit: Payer: Medicare Other | Admitting: Adult Health

## 2020-02-18 ENCOUNTER — Other Ambulatory Visit: Payer: Self-pay | Admitting: Internal Medicine

## 2020-02-18 DIAGNOSIS — E1122 Type 2 diabetes mellitus with diabetic chronic kidney disease: Secondary | ICD-10-CM

## 2020-02-23 NOTE — Progress Notes (Signed)
MEDICARE WELLNESS   Assessment and Plan:  Encounter for Medicare annual wellness exam 1 year  Essential hypertension - continue medications, DASH diet, exercise and monitor at home. Call if greater than 130/80.  - CMP/GFR  OSA (obstructive sleep apnea) Weight loss advised, needs to wear nightly, may benefit from new mask/titration   Hypothyroidism, unspecified hypothyroidism type Hypothyroidism-check TSH level, continue medications the same, reminded to take on an empty stomach 30-31mns before food.  - TSH   Hyperlipidemia associated with T2DM (HCC) - check lipids, decrease fatty foods, increase activity.  - Lipid panel   Vitamin D deficiency - Vit D  25 hydroxy (rtn osteoporosis monitoring)   Obesity, morbid Obesity with co morbidities- long discussion about weight loss, diet, and exercise  Medication management - Magnesium  CKD stage 3 due to type 2 diabetes mellitus Follows with nephrology Increase fluids, avoid NSAIDS, monitor sugars, will monitor  - CMP/GFR  Anxiety Will take valium prior to procedures but would like to be able to take AS needed for sleep, long discussion about addictive nature and tolerance, will only take PRN.   Macular degeneration, unspecified laterality, unspecified type  continue follow up eye doctor  Chronic gout without tophus, unspecified cause, unspecified site Gout- recheck Uric acid as needed, Diet discussed, continue medications.  Morbid obesity - follow up 3 months for progress monitoring - increase veggies, decrease carbs - long discussion about weight loss, diet, and exercise  Type 2 diabetes mellitus with hyperlipidemia ([facility] Discussed general issues about diabetes pathophysiology and management., Educational material distributed., Suggested low cholesterol diet., Encouraged aerobic exercise., Discussed foot care., Reminded to get yearly retinal exam. -     Hemoglobin A1c  Diabetic left shin ulcer (HCC) No erythema, does  have scant purulent discharge today but per patient has been improving with betadine/sugar slurry; continue with this and close monitoring; call if any erythema, wound not improving and will start doxycycline Hx of recurrent stasis ulcers; reviewed edema management; elevate extremities, wear compression can't elevate legs Check labs; increase lasix to BID short term if needed  Discussed med's effects and SE's. Screening labs and tests as requested with regular follow-up as recommended. Future Appointments  Date Time Provider [place] 03/14/2020  8:15 AM MHayden Pedro MD [facility] None  08/06/2020  9:00 AM CLiane Comber NP [facility] None    Plan:   During the course of the visit the patient was educated and counseled about appropriate screening and preventive services including:    Pneumococcal vaccine   Prevnar 13  Influenza vaccine  Td vaccine  Screening electrocardiogram  Bone densitometry screening  Colorectal cancer screening  Diabetes screening  Glaucoma screening  Nutrition counseling   Advanced directives: requested   HPI  74y.o. AA female  presents for medicare wellness and 3 month follow up.   She has hx of venous stasis, recurrent LE ulcers, reports today has L shin ulcer, believes she did hit her shin prior to onset, reports has had x 3 weeks, has been applying betadine/sugar slurry daily and seems to be improving per patient. She has noted some discharge, can't recall if yellow/purulent. Doing dressing changes daily.   She is getting shots for Mac Degen with Dr. MWendelyn Breslow she is prescribed valium to take prior to eye injections.   She has OSA and does CPAP though admits to using sporadically/infrequently, states she falls asleep in the den and forgets to put on.    She is following with ortho Dr. MRip Harbour  at [place] for bilateral knee pain and has been advised may need TKA at some point. She will take tylenol only if needed, uses  OTC topicals and takes tumeric/bioprene supplement daily.   BMI is Body mass index is 46.42 kg/m., she has been working on diet, reducing white items/starches, and sweets. Exercise is admittedly limited due to knee.  Wt Readings from Last 3 Encounters:  02/27/20 253 lb 12.8 oz (115.1 kg)  11/23/19 255 lb (115.7 kg)  08/04/19 248 lb (112.5 kg)   Her blood pressure has been controlled at home, today their BP is BP: 124/74 She does not workout due to knee pain.  She denies chest pain, shortness of breath, dizziness.   She is on cholesterol medication and denies myalgias. Her cholesterol is at goal. The cholesterol last visit was:   Lab Results  Component Value Date   CHOL 104 11/23/2019   HDL 35 (L) 11/23/2019   LDLCALC 52 11/23/2019   TRIG 89 11/23/2019   CHOLHDL 3.0 11/23/2019   She has had diabetes for 3 years. She has been working on diet and exercise for Diabetes with CKD stage III, she is on ACE, she is on bASA, She is off metformin due to kidneys, she is on 70/30 insulin 25 units and 10 units and denies paresthesia of the feet, polydipsia and polyuria. She is checking her sugars, fasting ranging from 90-140, average 120. Checks sugars twice a day with one touch ultra.  Denies any hypoglycemia. Last A1C in the office was:  Lab Results  Component Value Date   HGBA1C 5.9 (H) 11/23/2019   RECENTLY SHE WENT FROM STAGE 3 TO STAGE 4, HER LASIX WAS CUT DOWN TO 80MG ONCE A DAY AND METOLAZONE WAS STOPPED. SHE HAS EDEMA BUT HAS NO KNOWN HEART FAILURE, COUNSELED ON VENOUS INSUFFICIENCY AND COMPRESSION SOCKS. She has mild edema since reducing dose, typically well managed by compression.  Lab Results  Component Value Date   GFRAA 42 (L) 11/23/2019   Patient is on Vitamin D supplement.   Lab Results  Component Value Date   VD25OH 76 11/23/2019     She is on thyroid medication. Her medication was not changed last visit. She is on 125 mcg daily. She takes it 30 mins before food with just  water.  Lab Results  Component Value Date   TSH 1.51 11/23/2019   Patient is on allopurinol for gout and does not report a recent flare.  Lab Results  Component Value Date   LABURIC 6.6 11/23/2019     Current Medications:  Current Outpatient Medications on File Prior to Visit  Medication Sig Dispense Refill  . allopurinol (ZYLOPRIM) 300 MG tablet Take 1/2 tablet Daily to Prevent Gout 90 tablet 1  . aspirin EC 81 MG tablet Take 81 mg by mouth daily.    Marland Kitchen atenolol (TENORMIN) 100 MG tablet Take 1 tablet Daily for BP 90 tablet 1  . BESIVANCE 0.6 % SUSP Place 1 drop into the left eye 4 (four) times daily.     . Blood Glucose Monitoring Suppl (ONE TOUCH ULTRA 2) w/Device KIT check blood sugar 3 times daily-DX-E10.9 1 each 0  . Cholecalciferol (VITAMIN D3) 5000 units CAPS Take 10,000 Units by mouth daily.    . colchicine 0.6 MG tablet Take 1 tablet Daily to Prevent Gout 90 tablet 3  . furosemide (LASIX) 80 MG tablet Take 1 tablet 2 x /day for Fluid Retention / Ankle Swelling 180 tablet 3  . hyoscyamine (  LEVSIN, ANASPAZ) 0.125 MG tablet Take 1 tablet (0.125 mg total) by mouth every 6 (six) hours as needed. 60 tablet 1  . insulin NPH-regular Human (NOVOLIN 70/30) (70-30) 100 UNIT/ML injection Inject into the skin. Patient injects 25 units in the AM and 10 units in the PM.     . Lancets (ONETOUCH ULTRASOFT) lancets Use as instructed Dx. Code: E11.22 100 each 12  . Lancets (ONETOUCH ULTRASOFT) lancets Check blood sugar 3 times daily-DX-E10.9 300 each 1  . levothyroxine (SYNTHROID) 125 MCG tablet Take 1 tablet daily on an empty stomach with only water for 30 minutes & no Antacid meds, Calcium or Magnesium for 4 hours & avoid Biotin 90 tablet 3  . losartan (COZAAR) 100 MG tablet TAKE 1/2 TO 1 TABLET BY MOUTH DAILY 90 tablet 1  . ONETOUCH ULTRA test strip CHECK BLOOD SUGAR 3 TIMES A DAY 300 strip 3  . potassium chloride (K-DUR) 10 MEQ tablet Take 21 tablet Daily 90 tablet 3  . prednisoLONE acetate  (PRED FORTE) 1 % ophthalmic suspension INSTILL 1 DROP INTO EACH EYE TWICE A DAY 5 mL 0  . rosuvastatin (CRESTOR) 5 MG tablet Take 1 tablet Daily for Cholesterol 90 tablet 3   No current facility-administered medications on file prior to visit.   Health Maintenance:   Immunization History  Administered Date(s) Administered  . DT (Pediatric) 06/19/2015  . DTaP 12/09/2004  . Pneumococcal Conjugate-13 12/19/2014  . Pneumococcal Polysaccharide-23 03/02/2009, 01/17/2019   Last colonoscopy: 03/2014 normal Last mammogram: 08/2019 Last pap smear/pelvic exam: 2012, DONE DEXA: 11/2015 normal Carotid US 2013 CXR 2009  ECHO: 06/29/2018 - Aortic valve: Moderate focal calcification involving the left   coronary cusp. EF 41-32%, grade 1 diastolic dysfunction  Prior vaccinations: TD or Tdap: 2016 Influenza: declines Pneumococcal: 2010, 2019 Prevnar 13: 2015 Shingles/Zostavax: declines  Eye exam Dr. Zigmund Daniel sees q6w for injections, had diabetes eye report 06/14/2019 Report received and abstracted Dental: Dr. Cristi Loron, last exam a few years ago, ? 2019, will schedule   Foot exam: 08/04/2019   Patient Care Team: [name], MD as PCP - General (Internal Medicine) Warden Fillers, MD as Consulting Physician (Optometry) [name], MD as Consulting Physician (Ophthalmology) Inda Castle, MD (Inactive) as Consulting Physician (Gastroenterology) Druscilla Brownie, MD as Consulting Physician (Dermatology) Allyn Kenner, MD (Dermatology)   Medical History:  Past Medical History:  Diagnosis Date  . Allergic rhinitis   . Allergy   . Anxiety    with medical procedures  . Hyperlipidemia   . Hypertension   . Obesity   . OSA (obstructive sleep apnea)    wears CPAP  . Type II or unspecified type diabetes mellitus with renal manifestations, not stated as uncontrolled(250.40)   . Unspecified hypothyroidism   . Vitamin D deficiency    Allergies Allergies  Allergen Reactions   . Penicillins Swelling  . Sulfa Antibiotics Itching and Rash    SURGICAL HISTORY She  has a past surgical history that includes  eye procedure; Tubal ligation; and Dental examination under anesthesia. FAMILY HISTORY Her family history includes Breast cancer in her mother; Diabetes in her maternal grandmother and sister; Hypertension in her sister; Renal Disease in her mother. SOCIAL HISTORY She  reports that she has never smoked. She has never used smokeless tobacco. She reports current alcohol use. She reports that she does not use drugs.  MEDICARE WELLNESS OBJECTIVES: Physical activity: Current Exercise Habits: The patient does not participate in regular exercise at present, Exercise limited by: orthopedic  condition(s) Cardiac risk factors: Cardiac Risk Factors include: advanced age (>20mn, >>84women);dyslipidemia;hypertension;obesity (BMI >30kg/m2);sedentary lifestyle;diabetes mellitus Depression/mood screen:   [facility] 02/27/2020  Decreased Interest 0  Down, Depressed, Hopeless 0  PHQ - 2 Score 0    ADLs:  In your present state of health, do you have any difficulty performing the following activities: 02/27/2020 11/22/2019  Hearing? N N  Vision? N N  Difficulty concentrating or making decisions? N N  Walking or climbing stairs? N N  Comment bil knee pain, follows with ortho -  Dressing or bathing? N N  Doing errands, shopping? N N  Some recent data might be hidden     Cognitive Testing  Alert? Yes  Normal Appearance?Yes  Oriented to person? Yes  Place? Yes   Time? Yes  Recall of three objects?  Yes  Can perform simple calculations? Yes  Displays appropriate judgment?Yes  Can read the correct time from a watch face?Yes  EOL planning: Does Patient Have a Medical Advance Directive?: No Would patient like information on creating a medical advance directive?: Yes (MAU/Ambulatory/Procedural Areas - Information given)   Review of Systems  Constitutional:  Negative.  Negative for malaise/fatigue and weight loss.  HENT: Negative.  Negative for hearing loss and tinnitus.   Eyes: Negative.  Negative for blurred vision and double vision.  Respiratory: Negative.  Negative for cough, shortness of breath and wheezing.   Cardiovascular: Negative.  Negative for chest pain, palpitations, orthopnea, claudication and leg swelling.  Gastrointestinal: Negative.  Negative for abdominal pain, blood in stool, constipation, diarrhea, heartburn, melena, nausea and vomiting.  Genitourinary: Negative.  Negative for dysuria, flank pain, frequency, hematuria and urgency.  Musculoskeletal: Positive for joint pain (bilateral knee pain). Negative for myalgias.  Skin: Negative.  Negative for rash.  Neurological: Negative.  Negative for dizziness, tingling, sensory change, weakness and headaches.  Endo/Heme/Allergies: Negative.  Negative for polydipsia.  Psychiatric/Behavioral: Negative.  Negative for depression, hallucinations, memory loss, substance abuse and suicidal ideas. The patient is not nervous/anxious and does not have insomnia.   All other systems reviewed and are negative.   Physical Exam: Estimated body mass index is 46.42 kg/m as calculated from the following:   Height as of this encounter: _0  (1.575 m).   Weight as of this encounter: 253 lb 12.8 oz (115.1 kg). BP 124/74   Pulse 73   Temp 97.7 F (36.5 C)   Ht _1  (1.575 m)   Wt 253 lb 12.8 oz (115.1 kg)   SpO2 96%   BMI 46.42 kg/m  General Appearance: Well nourished, in no apparent distress.  Eyes: PERRLA, EOMs, conjunctiva no swelling or erythema, normal fundi and vessels.  Sinuses: No Frontal/maxillary tenderness  ENT/Mouth: Ext aud canals clear, normal light reflex with TMs without erythema, bulging. Good dentition. No erythema, swelling, or exudate on post pharynx. Tonsils not swollen or erythematous. Hearing normal.  Neck: Supple, thyroid normal. No bruits  Respiratory: Respiratory  effort normal, BS equal bilaterally without rales, rhonchi, wheezing or stridor.  Cardio: RRR with normal S1/S2, without rubs or gallops. Brisk peripheral pulses with mild edema.  Chest: symmetric, with normal excursions and percussion.  Breasts: declines, states getting MGM Abdomen: Soft, nontender, no guarding, rebound, hernias, masses, or organomegaly. .  Lymphatics: Non tender without lymphadenopathy.  Genitourinary: defer Musculoskeletal: Full ROM all peripheral extremities, 5/5 strength, and antalgic gait.  Skin: Vitiligo bilateral hands and feet. Warm, dry without rashes, lesions, ecchymosis. She has open ulcerated wound to left  shin, approx 1.5 x 1.5 cm area, breakdown to fatty layer, scant purulent discharge without surrounding erythema Neuro: Cranial nerves intact, reflexes equal bilaterally. Normal muscle tone, no cerebellar symptoms. Sensation intact.  Psych: Awake and oriented X 3, normal affect, Insight and Judgment appropriate.    Medicare Attestation I have personally reviewed: The patient's medical and social history Their use of alcohol, tobacco or illicit drugs Their current medications and supplements The patient's functional ability including ADLs,fall risks, home safety risks, cognitive, and hearing and visual impairment Diet and physical activities Evidence for depression or mood disorders  The patient's weight, height, BMI, and visual acuity have been recorded in the chart.  I have made referrals, counseling, and provided education to the patient based on review of the above and I have provided the patient with a written personalized care plan for preventive services.      Gorden Harms Sabastion Hrdlicka 10:16 AM [facility]

## 2020-02-27 ENCOUNTER — Encounter: Payer: Self-pay | Admitting: Adult Health

## 2020-02-27 ENCOUNTER — Other Ambulatory Visit: Payer: Self-pay

## 2020-02-27 ENCOUNTER — Ambulatory Visit (INDEPENDENT_AMBULATORY_CARE_PROVIDER_SITE_OTHER): Payer: Medicare Other | Admitting: Adult Health

## 2020-02-27 VITALS — BP 124/74 | HR 73 | Temp 97.7°F | Ht 62.0 in | Wt 253.8 lb

## 2020-02-27 DIAGNOSIS — G4733 Obstructive sleep apnea (adult) (pediatric): Secondary | ICD-10-CM

## 2020-02-27 DIAGNOSIS — N2581 Secondary hyperparathyroidism of renal origin: Secondary | ICD-10-CM

## 2020-02-27 DIAGNOSIS — E1122 Type 2 diabetes mellitus with diabetic chronic kidney disease: Secondary | ICD-10-CM | POA: Diagnosis not present

## 2020-02-27 DIAGNOSIS — E1169 Type 2 diabetes mellitus with other specified complication: Secondary | ICD-10-CM | POA: Diagnosis not present

## 2020-02-27 DIAGNOSIS — E11622 Type 2 diabetes mellitus with other skin ulcer: Secondary | ICD-10-CM

## 2020-02-27 DIAGNOSIS — I10 Essential (primary) hypertension: Secondary | ICD-10-CM

## 2020-02-27 DIAGNOSIS — I83029 Varicose veins of left lower extremity with ulcer of unspecified site: Secondary | ICD-10-CM | POA: Insufficient documentation

## 2020-02-27 DIAGNOSIS — F411 Generalized anxiety disorder: Secondary | ICD-10-CM

## 2020-02-27 DIAGNOSIS — E559 Vitamin D deficiency, unspecified: Secondary | ICD-10-CM

## 2020-02-27 DIAGNOSIS — R6889 Other general symptoms and signs: Secondary | ICD-10-CM | POA: Diagnosis not present

## 2020-02-27 DIAGNOSIS — E039 Hypothyroidism, unspecified: Secondary | ICD-10-CM

## 2020-02-27 DIAGNOSIS — Z0001 Encounter for general adult medical examination with abnormal findings: Secondary | ICD-10-CM

## 2020-02-27 DIAGNOSIS — L97929 Non-pressure chronic ulcer of unspecified part of left lower leg with unspecified severity: Secondary | ICD-10-CM | POA: Insufficient documentation

## 2020-02-27 DIAGNOSIS — M1A9XX0 Chronic gout, unspecified, without tophus (tophi): Secondary | ICD-10-CM

## 2020-02-27 DIAGNOSIS — Z0000 Encounter for general adult medical examination without abnormal findings: Secondary | ICD-10-CM

## 2020-02-27 DIAGNOSIS — E11319 Type 2 diabetes mellitus with unspecified diabetic retinopathy without macular edema: Secondary | ICD-10-CM | POA: Diagnosis not present

## 2020-02-27 DIAGNOSIS — Z79899 Other long term (current) drug therapy: Secondary | ICD-10-CM

## 2020-02-27 NOTE — Patient Instructions (Addendum)
Jocelyn Camacho , Thank you for taking time to come for your Medicare Wellness Visit. I appreciate your ongoing commitment to your health goals. Please review the following plan we discussed and let me know if I can assist you in the future.   These are the goals we discussed: Goals    . Exercise 3x per week (30 min per time)    . Weight (lb) < 240 lb (108.9 kg)       This is a list of the screening recommended for you and due dates:  Health Maintenance  Topic Date Due  . Flu Shot  07/23/2019  . Hemoglobin A1C  05/23/2020  . Complete foot exam   08/03/2020  . Eye exam for diabetics  12/26/2020  . Mammogram  09/11/2021  . Tetanus Vaccine  02/27/2024  . Colon Cancer Screening  04/17/2024  . DEXA scan (bone density measurement)  Completed  .  Hepatitis C: One time screening is recommended by Center for Disease Control  (CDC) for  adults born from [birth year] through 1965.   Completed  . Pneumonia vaccines  Completed     Try water aerobics - very helpful for heart health and weight loss and knee problems  Move your CPAP machine to the den try to start wearing more frequently Untreated sleep apnea can contribute to dementia, heart rhythm problems, heart failure, and make it difficult to lose weight   Continue with betadine/sugar slurry to leg wound; check daily  Call if not improving, any surrounding redness, may need to check and add antibiotic Continue elevating feet, wear compression if your legs will be down  Increase water pill only if needed, resume once a day dosing once swelling improves  Wound care to do every day . Take off old bandage . Rinse off leg thoroughly in shower, wipe off any excess thick yellow discharge until you see a clean wound with some gauze or soft washcloth . Mix up betadine slurry: Combine sugar and betadine in a container that you can seal and keep; mix ingredients to create a paste the consistency of peanut butter . Apply betadine and sugar mixture to wound to  cover all edges, apply non-adherent dressing pads, then an absorbent dressing (gauze or ABD) to catch any excess leaking, then wrap with ace, gauze wrapping and or tape to hold dressing in place.  . Remember betadine will stain clothing and sheets - make sure to cover/protect any furniture or linens - you can put down absorbent chucks pad or trash bags    Venous Ulcer A venous ulcer is a shallow sore on your lower leg that is caused by poor circulation in your veins. This condition used to be called stasis ulcer. Venous ulcer is the most common type of lower leg ulcer. You may have venous ulcers on one leg or on both legs. The area where this condition most commonly develops is around the ankles. A venous ulcer may last for a long time (chronic ulcer) or it may return repeatedly (recurrent ulcer). What are the causes? A venous ulcer may be caused by any condition that causes poor blood flow in your legs. Veins have valves that help return blood to the heart. If these valves do not work properly:  Blood can flow backward and pool in the lower legs.  Blood can then leak out of your veins, which can irritate your skin.  Irritation can cause a break in the skin, which becomes a venous ulcer. What increases the risk?  You are more likely to develop this condition if you:  Are 75 years of age or older.  Are female.  Are overweight.  Are not active.  Have had a leg ulcer in the past.  Have varicose veins.  Have clots in your lower leg veins (deep vein thrombosis).  Have inflammation of your leg veins (phlebitis).  Have recently had a pregnancy.  Use products that contain nicotine or tobacco. What are the signs or symptoms? The main symptom of this condition is an open sore near your ankle. Other symptoms may include:  Swelling.  Thickening of the skin.  Fluid leaking from the ulcer.  Bleeding.  Itching.  Pain and swelling that gets worse when you stand up and feels better  when you raise your leg.  Blotchy skin.  Darkening of the skin. How is this diagnosed? Your health care provider may suspect a venous ulcer based on your medical history and your risk factors. He or she may:  Do a physical exam.  Do other tests, such as: ? Measuring blood pressure in your arms and legs. ? Using sound waves (ultrasound) to measure blood flow in your leg veins. How is this treated? This condition may be treated by:  Keeping your leg raised (elevated).  Wearing a type of bandage or stocking to compress the veins of your leg (compression therapy).  Taking medicines to improve blood flow.  Taking antibiotic medicines to treat infection.  Cleaning your ulcer and removing any dead tissue from the wound (debridement).  Placing various types of medicated bandages (dressings) or medicated wraps on your ulcer.  Surgery to close the wound using a piece of skin taken from another area of your body (graft). This is only done for wounds that are deep or hard to heal. You may need to try several different types of treatment to get your venous ulcer to heal. Healing may take a long time. Follow these instructions at home: Medicines  Take or apply over-the-counter and prescription medicines only as told by your health care provider.  If you were prescribed an antibiotic medicine, take it as told by your health care provider. Do not stop using the antibiotic even if you start to feel better.  Ask your health care provider if you should take aspirin before long trips. Wound care  Follow instructions from your health care provider about how to take care of your wound. Make sure you: ? Wash your hands with soap and water before and after you change your bandage (dressing). If soap and water are not available, use hand sanitizer. ? Change your dressing as told by your health care provider. ? If you had a skin graft, leave stitches (sutures) in place. These may need to stay in  place for 2 weeks or longer. ? Ask when you should remove your dressing. If your dressing is dry and sticks to your leg when you try to remove it, moisten or wet the dressing with saline solution or water so that the dressing can be removed without harming your skin or wound tissue.  When you are able to remove your dressing, check your wound every day for signs of infection. Have a caregiver do this for you if you are not able to do it yourself. Check for: ? More redness, swelling, or pain. ? More fluid or blood. ? Warmth. ? Pus or a bad smell. Activity  Avoid sitting for a long time without moving. Get up to take short walks every 1-2  hours. This is important to improve blood flow in your legs. Ask for help if you feel weak or unsteady.  Ask your health care provider what level of activity is safe for you.  Rest with your legs raised (elevated) during the day. If possible, elevate your legs above the level of your heart for 30 minutes, 3-4 times a day, or as told by your health care provider.  Do not sit with your legs crossed. General instructions   Wear elastic stockings, compression stockings, or support hose as told by your health care provider.  Raise the foot of your bed as told by your health care provider.  Do not use any products that contain nicotine or tobacco, such as cigarettes, e-cigarettes, and chewing tobacco. If you need help quitting, ask your health care provider.  Keep all follow-up visits as told by your health care provider. This is important. Contact a health care provider if:  Your ulcer is getting larger or is not healing.  Your pain gets worse. Get help right away if you have:  More redness, swelling, or pain around your ulcer.  More fluid or blood coming from your ulcer.  Warmth in the area around your ulcer.  Pus or a bad smell coming from your ulcer.  A fever. Summary  A venous ulcer is a shallow sore on your lower leg that is caused by poor  circulation in your veins.  Follow instructions from your health care provider about how to take care of your wound.  Check your wound every day for signs of infection.  Take over-the-counter and prescription medicines only as told by your health care provider.  Keep all follow-up visits as told by your health care provider. This is important. This information is not intended to replace advice given to you by your health care provider. Make sure you discuss any questions you have with your health care provider. Document Revised: 08/05/2018 Document Reviewed: 08/05/2018 Elsevier Patient Education  [place].

## 2020-02-28 LAB — HEMOGLOBIN A1C
Hgb A1c MFr Bld: 5.7 % of total Hgb — ABNORMAL HIGH (ref <5.7)
Mean Plasma Glucose: 117 (calc)
eAG (mmol/L): 6.5 (calc)

## 2020-02-28 LAB — CBC WITH DIFFERENTIAL/PLATELET
Absolute Monocytes: 568 cells/uL (ref 200–950)
Basophils Absolute: 57 cells/uL (ref 0–200)
Basophils Relative: 0.8 %
Eosinophils Absolute: 199 cells/uL (ref 15–500)
Eosinophils Relative: 2.8 %
HCT: 34.3 % — ABNORMAL LOW (ref 35.0–45.0)
Hemoglobin: 10.8 g/dL — ABNORMAL LOW (ref 11.7–15.5)
Lymphs Abs: 2421 cells/uL (ref 850–3900)
MCH: 28.2 pg (ref 27.0–33.0)
MCHC: 31.5 g/dL — ABNORMAL LOW (ref 32.0–36.0)
MCV: 89.6 fL (ref 80.0–100.0)
MPV: 12.5 fL (ref 7.5–12.5)
Monocytes Relative: 8 %
Neutro Abs: 3855 cells/uL (ref 1500–7800)
Neutrophils Relative %: 54.3 %
Platelets: 313 10*3/uL (ref 140–400)
RBC: 3.83 10*6/uL (ref 3.80–5.10)
RDW: 15 % (ref 11.0–15.0)
Total Lymphocyte: 34.1 %
WBC: 7.1 10*3/uL (ref 3.8–10.8)

## 2020-02-28 LAB — LIPID PANEL
Cholesterol: 113 mg/dL (ref <200)
HDL: 35 mg/dL — ABNORMAL LOW (ref >=50)
LDL Cholesterol (Calc): 59 mg/dL (calc)
Non-HDL Cholesterol (Calc): 78 mg/dL (calc) (ref <130)
Total CHOL/HDL Ratio: 3.2 (calc) (ref <5.0)
Triglycerides: 111 mg/dL (ref <150)

## 2020-02-28 LAB — COMPLETE METABOLIC PANEL WITH GFR
AG Ratio: 1.3 (calc) (ref 1.0–2.5)
ALT: 19 U/L (ref 6–29)
AST: 22 U/L (ref 10–35)
Albumin: 4 g/dL (ref 3.6–5.1)
Alkaline phosphatase (APISO): 89 U/L (ref 37–153)
BUN/Creatinine Ratio: 15 (calc) (ref 6–22)
BUN: 22 mg/dL (ref 7–25)
CO2: 32 mmol/L (ref 20–32)
Calcium: 9.8 mg/dL (ref 8.6–10.4)
Chloride: 104 mmol/L (ref 98–110)
Creat: 1.42 mg/dL — ABNORMAL HIGH (ref 0.60–0.93)
GFR, Est African American: 42 mL/min/{1.73_m2} — ABNORMAL LOW (ref >=60)
GFR, Est Non African American: 37 mL/min/{1.73_m2} — ABNORMAL LOW (ref >=60)
Globulin: 3 g/dL (calc) (ref 1.9–3.7)
Glucose, Bld: 115 mg/dL — ABNORMAL HIGH (ref 65–99)
Potassium: 4.2 mmol/L (ref 3.5–5.3)
Sodium: 144 mmol/L (ref 135–146)
Total Bilirubin: 0.8 mg/dL (ref 0.2–1.2)
Total Protein: 7 g/dL (ref 6.1–8.1)

## 2020-02-28 LAB — TSH: TSH: 1.36 mIU/L (ref 0.40–4.50)

## 2020-02-28 LAB — MAGNESIUM: Magnesium: 2.3 mg/dL (ref 1.5–2.5)

## 2020-03-13 ENCOUNTER — Encounter (INDEPENDENT_AMBULATORY_CARE_PROVIDER_SITE_OTHER): Payer: Self-pay

## 2020-03-13 ENCOUNTER — Encounter (INDEPENDENT_AMBULATORY_CARE_PROVIDER_SITE_OTHER): Payer: Medicare Other | Admitting: Ophthalmology

## 2020-03-14 ENCOUNTER — Encounter (INDEPENDENT_AMBULATORY_CARE_PROVIDER_SITE_OTHER): Payer: Medicare Other | Admitting: Ophthalmology

## 2020-03-14 ENCOUNTER — Other Ambulatory Visit: Payer: Self-pay

## 2020-03-14 DIAGNOSIS — I10 Essential (primary) hypertension: Secondary | ICD-10-CM

## 2020-03-14 DIAGNOSIS — H43813 Vitreous degeneration, bilateral: Secondary | ICD-10-CM | POA: Diagnosis not present

## 2020-03-14 DIAGNOSIS — H35033 Hypertensive retinopathy, bilateral: Secondary | ICD-10-CM

## 2020-03-14 DIAGNOSIS — H34812 Central retinal vein occlusion, left eye, with macular edema: Secondary | ICD-10-CM

## 2020-03-30 ENCOUNTER — Telehealth: Payer: Self-pay

## 2020-03-30 NOTE — Telephone Encounter (Signed)
Patient states that the place on the back of her leg has come back and looks more like a blister.

## 2020-04-02 ENCOUNTER — Encounter: Payer: Self-pay | Admitting: Adult Health

## 2020-04-02 ENCOUNTER — Other Ambulatory Visit: Payer: Self-pay

## 2020-04-02 ENCOUNTER — Ambulatory Visit (INDEPENDENT_AMBULATORY_CARE_PROVIDER_SITE_OTHER): Payer: Medicare Other | Admitting: Adult Health

## 2020-04-02 VITALS — BP 110/72 | HR 70 | Temp 97.7°F | Ht 62.0 in | Wt 248.0 lb

## 2020-04-02 DIAGNOSIS — S80822A Blister (nonthermal), left lower leg, initial encounter: Secondary | ICD-10-CM | POA: Diagnosis not present

## 2020-04-02 DIAGNOSIS — L97321 Non-pressure chronic ulcer of left ankle limited to breakdown of skin: Secondary | ICD-10-CM

## 2020-04-02 DIAGNOSIS — I872 Venous insufficiency (chronic) (peripheral): Secondary | ICD-10-CM

## 2020-04-02 MED ORDER — DOXYCYCLINE HYCLATE 100 MG PO CAPS: ORAL_CAPSULE | ORAL | 0 refills | Status: DC

## 2020-04-02 NOTE — Telephone Encounter (Signed)
Patient is scheduled to come in today, 04/02/20

## 2020-04-02 NOTE — Patient Instructions (Addendum)
Monitor for worsening redness/tenderness, yellow mucus discharge, fever/chills, etc and call if you note this  Start anbitiotic - doxycycline Do dressing changes daily until no longer have an "open" wound - can stop once it looks dry Most important is water pill, elevating legs and COMPRESSION DAILY    Wound care to do every day . Take off old bandage . Rinse off leg thoroughly in shower, wipe off any excess thick yellow discharge until you see a clean wound with some gauze or soft washcloth . Mix up betadine slurry: Combine sugar and betadine in a container that you can seal and keep; mix ingredients to create a paste the consistency of peanut butter . Apply betadine and sugar mixture to wound to cover all edges, apply non-adherent dressing pads, then an absorbent dressing (gauze or ABD) to catch any excess leaking, then wrap with ace, gauze wrapping and or tape to hold dressing in place.  . Remember betadine will stain clothing and sheets - make sure to cover/protect any furniture or linens - you can put down absorbent chucks pad or trash bags   Venous Ulcer A venous ulcer is a shallow sore on your lower leg that is caused by poor circulation in your veins. This condition used to be called stasis ulcer. Venous ulcer is the most common type of lower leg ulcer. You may have venous ulcers on one leg or on both legs. The area where this condition most commonly develops is around the ankles. A venous ulcer may last for a long time (chronic ulcer) or it may return repeatedly (recurrent ulcer). What are the causes? A venous ulcer may be caused by any condition that causes poor blood flow in your legs. Veins have valves that help return blood to the heart. If these valves do not work properly:  Blood can flow backward and pool in the lower legs.  Blood can then leak out of your veins, which can irritate your skin.  Irritation can cause a break in the skin, which becomes a venous ulcer. What  increases the risk? You are more likely to develop this condition if you:  Are 11 years of age or older.  Are female.  Are overweight.  Are not active.  Have had a leg ulcer in the past.  Have varicose veins.  Have clots in your lower leg veins (deep vein thrombosis).  Have inflammation of your leg veins (phlebitis).  Have recently had a pregnancy.  Use products that contain nicotine or tobacco. What are the signs or symptoms? The main symptom of this condition is an open sore near your ankle. Other symptoms may include:  Swelling.  Thickening of the skin.  Fluid leaking from the ulcer.  Bleeding.  Itching.  Pain and swelling that gets worse when you stand up and feels better when you raise your leg.  Blotchy skin.  Darkening of the skin. How is this diagnosed? Your health care provider may suspect a venous ulcer based on your medical history and your risk factors. He or she may:  Do a physical exam.  Do other tests, such as: ? Measuring blood pressure in your arms and legs. ? Using sound waves (ultrasound) to measure blood flow in your leg veins. How is this treated? This condition may be treated by:  Keeping your leg raised (elevated).  Wearing a type of bandage or stocking to compress the veins of your leg (compression therapy).  Taking medicines to improve blood flow.  Taking antibiotic medicines to  treat infection.  Cleaning your ulcer and removing any dead tissue from the wound (debridement).  Placing various types of medicated bandages (dressings) or medicated wraps on your ulcer.  Surgery to close the wound using a piece of skin taken from another area of your body (graft). This is only done for wounds that are deep or hard to heal. You may need to try several different types of treatment to get your venous ulcer to heal. Healing may take a long time. Follow these instructions at home: Medicines  Take or apply over-the-counter and  prescription medicines only as told by your health care provider.  If you were prescribed an antibiotic medicine, take it as told by your health care provider. Do not stop using the antibiotic even if you start to feel better.  Ask your health care provider if you should take aspirin before long trips. Wound care  Follow instructions from your health care provider about how to take care of your wound. Make sure you: ? Wash your hands with soap and water before and after you change your bandage (dressing). If soap and water are not available, use hand sanitizer. ? Change your dressing as told by your health care provider. ? If you had a skin graft, leave stitches (sutures) in place. These may need to stay in place for 2 weeks or longer. ? Ask when you should remove your dressing. If your dressing is dry and sticks to your leg when you try to remove it, moisten or wet the dressing with saline solution or water so that the dressing can be removed without harming your skin or wound tissue.  When you are able to remove your dressing, check your wound every day for signs of infection. Have a caregiver do this for you if you are not able to do it yourself. Check for: ? More redness, swelling, or pain. ? More fluid or blood. ? Warmth. ? Pus or a bad smell. Activity  Avoid sitting for a long time without moving. Get up to take short walks every 1-2 hours. This is important to improve blood flow in your legs. Ask for help if you feel weak or unsteady.  Ask your health care provider what level of activity is safe for you.  Rest with your legs raised (elevated) during the day. If possible, elevate your legs above the level of your heart for 30 minutes, 3-4 times a day, or as told by your health care provider.  Do not sit with your legs crossed. General instructions   Wear elastic stockings, compression stockings, or support hose as told by your health care provider.  Raise the foot of your bed as  told by your health care provider.  Do not use any products that contain nicotine or tobacco, such as cigarettes, e-cigarettes, and chewing tobacco. If you need help quitting, ask your health care provider.  Keep all follow-up visits as told by your health care provider. This is important. Contact a health care provider if:  Your ulcer is getting larger or is not healing.  Your pain gets worse. Get help right away if you have:  More redness, swelling, or pain around your ulcer.  More fluid or blood coming from your ulcer.  Warmth in the area around your ulcer.  Pus or a bad smell coming from your ulcer.  A fever. Summary  A venous ulcer is a shallow sore on your lower leg that is caused by poor circulation in your veins.  Follow  instructions from your health care provider about how to take care of your wound.  Check your wound every day for signs of infection.  Take over-the-counter and prescription medicines only as told by your health care provider.  Keep all follow-up visits as told by your health care provider. This is important. This information is not intended to replace advice given to you by your health care provider. Make sure you discuss any questions you have with your health care provider. Document Revised: 08/05/2018 Document Reviewed: 08/05/2018 Elsevier Patient Education  [place].    Doxycycline tablets or capsules What is this medicine? DOXYCYCLINE (dox i SYE kleen) is a tetracycline antibiotic. It kills certain bacteria or stops their growth. It is used to treat many kinds of infections, like dental, skin, respiratory, and urinary tract infections. It also treats acne, Lyme disease, malaria, and certain sexually transmitted infections. This medicine may be used for other purposes; ask your health care provider or pharmacist if you have questions. COMMON BRAND NAME(S): Acticlate, Adoxa, Adoxa CK, Adoxa Pak, Adoxa TT, Alodox, Avidoxy, Doxal,  LYMEPAK, Mondoxyne NL, Monodox, Morgidox 1x, Morgidox 1x Kit, Morgidox 2x, Morgidox 2x Kit, NutriDox, Ocudox, [place], [place], Vibra-Tabs, Vibramycin What should I tell my health care provider before I take this medicine? They need to know if you have any of these conditions:  liver disease  long exposure to sunlight like working outdoors  stomach problems like colitis  an unusual or allergic reaction to doxycycline, tetracycline antibiotics, other medicines, foods, dyes, or preservatives  pregnant or trying to get pregnant  breast-feeding How should I use this medicine? Take this medicine by mouth with a full glass of water. Follow the directions on the prescription label. It is best to take this medicine without food, but if it upsets your stomach take it with food. Take your medicine at regular intervals. Do not take your medicine more often than directed. Take all of your medicine as directed even if you think you are better. Do not skip doses or stop your medicine early. Talk to your pediatrician regarding the use of this medicine in children. While this drug may be prescribed for selected conditions, precautions do apply. Overdosage: If you think you have taken too much of this medicine contact a poison control center or emergency room at once. NOTE: This medicine is only for you. Do not share this medicine with others. What if I miss a dose? If you miss a dose, take it as soon as you can. If it is almost time for your next dose, take only that dose. Do not take double or extra doses. What may interact with this medicine?  antacids  barbiturates  birth control pills  bismuth subsalicylate  carbamazepine  methoxyflurane  other antibiotics  phenytoin  vitamins that contain iron  warfarin This list may not describe all possible interactions. Give your health care provider a list of all the medicines, herbs, non-prescription drugs, or dietary supplements you use. Also  tell them if you smoke, drink alcohol, or use illegal drugs. Some items may interact with your medicine. What should I watch for while using this medicine? Tell your doctor or health care professional if your symptoms do not improve. Do not treat diarrhea with over the counter products. Contact your doctor if you have diarrhea that lasts more than 2 days or if it is severe and watery. Do not take this medicine just before going to bed. It may not dissolve properly when you lay down  and can cause pain in your throat. Drink plenty of fluids while taking this medicine to also help reduce irritation in your throat. This medicine can make you more sensitive to the sun. Keep out of the sun. If you cannot avoid being in the sun, wear protective clothing and use sunscreen. Do not use sun lamps or tanning beds/booths. Birth control pills may not work properly while you are taking this medicine. Talk to your doctor about using an extra method of birth control. If you are being treated for a sexually transmitted infection, avoid sexual contact until you have finished your treatment. Your sexual partner may also need treatment. Avoid antacids, aluminum, calcium, magnesium, and iron products for 4 hours before and 2 hours after taking a dose of this medicine. If you are using this medicine to prevent malaria, you should still protect yourself from contact with mosquitos. Stay in screened-in areas, use mosquito nets, keep your body covered, and use an insect repellent. What side effects may I notice from receiving this medicine? Side effects that you should report to your doctor or health care professional as soon as possible:  allergic reactions like skin rash, itching or hives, swelling of the face, lips, or tongue  difficulty breathing  fever  itching in the rectal or genital area  pain on swallowing  rash, fever, and swollen lymph nodes  redness, blistering, peeling or loosening of the skin, including  inside the mouth  severe stomach pain or cramps  unusual bleeding or bruising  unusually weak or tired  yellowing of the eyes or skin Side effects that usually do not require medical attention (report to your doctor or health care professional if they continue or are bothersome):  diarrhea  loss of appetite  nausea, vomiting This list may not describe all possible side effects. Call your doctor for medical advice about side effects. You may report side effects to FDA at 1-800-FDA-1088. Where should I keep my medicine? Keep out of the reach of children. Store at room temperature, below 30 degrees C (86 degrees F). Protect from light. Keep container tightly closed. Throw away any unused medicine after the expiration date. Taking this medicine after the expiration date can make you seriously ill. NOTE: This sheet is a summary. It may not cover all possible information. If you have questions about this medicine, talk to your doctor, pharmacist, or health care provider.  2020 Elsevier/Gold Standard (2019-03-10 13:44:53)

## 2020-04-02 NOTE — Progress Notes (Signed)
Assessment and Plan:  Shawna was seen today for blister.  Diagnoses and all orders for this visit:  Blister of left lower leg, initial encounter Venous stasis ulcer of left ankle limited to breakdown of skin without varicose veins (HCC) Spontaneous blister, hx of chronic peripheral edema, suspect this represents a venous stasis ulcer Emphasized edema management - elevate legs TID, increase activity, increase water, decrease sodium intake.   Wear compression socks daily - apply first thing in AM.  Can continue betadine/sugar slurry, stop once discharge stops and no open wound Will initiate doxycycline due to some redness, possible mild cellulitis in diabetic Follow up 2 weeks for monitoring, sooner if worsening wound, redness, fever/chills or other concerns -     doxycycline (VIBRAMYCIN) 100 MG capsule; Take 1 capsule twice daily with food  Further disposition pending results of labs. Discussed med's effects and SE's.   Over 15 minutes of exam, counseling, chart review, and critical decision making was performed.   Future Appointments  Date Time Provider [place]  04/24/2020  7:30 AM [name], MD [facility] None  08/06/2020  9:00 AM [name], NP [facility] None  03/04/2021 10:00 AM [name], NP [facility] None    ------------------------------------------------------------------------------------------------------------------   HPI BP 110/72   Pulse 70   Temp 97.7 F (36.5 C)   Ht _0  (1.575 m)   Wt 248 lb (112.5 kg)   SpO2 98%   BMI 45.36 kg/m   74 y.o.female with T2DM with CKD 3b, morbid obesity with chronic peripheral edema/venous stasis presents for evaluation of new left ankle/pretibial blister, appeared spontaneously 5 days ago(03/29/2020) without notable trigger or injury. She reports blister burst, has had clear discharge, continuous, denies purulent discharge, heat/redness, fever/chills. She reports has been applying betadine/sugar slurry which  has been recommended for diabetic leg wounds in the past which does seem to be helping. Typically does wear compression hose for swelling, takes lasix 80 mg BID for edema, admits she may have missed this the day prior to onset.   BMI is Body mass index is 45.36 kg/m., she has not been working on diet and exercise. Wt Readings from Last 3 Encounters:  04/02/20 248 lb (112.5 kg)  02/27/20 253 lb 12.8 oz (115.1 kg)  11/23/19 255 lb (115.7 kg)    Last A1C in the office was:  Lab Results  Component Value Date   HGBA1C 5.7 (H) 02/27/2020   She has CKD IIIb r/t T2DM, follows with nephrology Lab Results  Component Value Date   GFRAA 42 (L) 02/27/2020     Past Medical History:  Diagnosis Date  . Allergic rhinitis   . Allergy   . Anxiety    with medical procedures  . Hyperlipidemia   . Hypertension   . Obesity   . OSA (obstructive sleep apnea)    wears CPAP  . Type II or unspecified type diabetes mellitus with renal manifestations, not stated as uncontrolled(250.40)   . Unspecified hypothyroidism   . Vitamin D deficiency      Allergies  Allergen Reactions  . Penicillins Swelling  . Sulfa Antibiotics Itching and Rash    Current Outpatient Medications on File Prior to Visit  Medication Sig  . allopurinol (ZYLOPRIM) 300 MG tablet Take 1/2 tablet Daily to Prevent Gout  . aspirin EC 81 MG tablet Take 81 mg by mouth daily.  Marland Kitchen atenolol (TENORMIN) 100 MG tablet Take 1 tablet Daily for BP  . BESIVANCE 0.6 % SUSP Place 1 drop into the  left eye 4 (four) times daily.   Marland Kitchen BLACK PEPPER-TURMERIC PO Take by mouth daily.  . Blood Glucose Monitoring Suppl (ONE TOUCH ULTRA 2) w/Device KIT check blood sugar 3 times daily-DX-E10.9  . Cholecalciferol (VITAMIN D3) 5000 units CAPS Take 10,000 Units by mouth daily.  . colchicine 0.6 MG tablet Take 1 tablet Daily to Prevent Gout  . furosemide (LASIX) 80 MG tablet Take 1 tablet 2 x /day for Fluid Retention / Ankle Swelling  . hyoscyamine (LEVSIN,  ANASPAZ) 0.125 MG tablet Take 1 tablet (0.125 mg total) by mouth every 6 (six) hours as needed.  . insulin NPH-regular Human (NOVOLIN 70/30) (70-30) 100 UNIT/ML injection Inject into the skin. Patient injects 25 units in the AM and 10 units in the PM.   . Lancets (ONETOUCH ULTRASOFT) lancets Use as instructed Dx. Code: E11.22  . Lancets (ONETOUCH ULTRASOFT) lancets Check blood sugar 3 times daily-DX-E10.9  . levothyroxine (SYNTHROID) 125 MCG tablet Take 1 tablet daily on an empty stomach with only water for 30 minutes & no Antacid meds, Calcium or Magnesium for 4 hours & avoid Biotin  . losartan (COZAAR) 100 MG tablet TAKE 1/2 TO 1 TABLET BY MOUTH DAILY  . ONETOUCH ULTRA test strip CHECK BLOOD SUGAR 3 TIMES A DAY  . potassium chloride (K-DUR) 10 MEQ tablet Take 21 tablet Daily  . prednisoLONE acetate (PRED FORTE) 1 % ophthalmic suspension INSTILL 1 DROP INTO EACH EYE TWICE A DAY  . rosuvastatin (CRESTOR) 5 MG tablet Take 1 tablet Daily for Cholesterol   No current facility-administered medications on file prior to visit.    ROS: all negative except above.   Physical Exam:  BP 110/72   Pulse 70   Temp 97.7 F (36.5 C)   Ht _0  (1.575 m)   Wt 248 lb (112.5 kg)   SpO2 98%   BMI 45.36 kg/m   General Appearance: Well dressed morbidly obese AA female in no apparent distress. Eyes: PERRLA, conjunctiva no swelling or erythema ENT/Mouth: mask in place; Hearing normal.  Neck: Supple Respiratory: Respiratory effort normal, BS equal bilaterally without rales, rhonchi, wheezing or stridor.  Cardio: RRR with no MRGs. Brisk peripheral pulses with bil non-pitting edema of ankles and lower legs; R compression in place  Abdomen: Soft, + BS.  Non tender, no guarding, rebound, hernias, masses. Lymphatics: Non tender without lymphadenopathy.  Musculoskeletal: No obvious deformity, no effusion, slow steady gait Skin: Warm, dry; she has large blister/bulla to left lateral ankle/shin; approx 7 cm x 5  cm area; skin intact with some serous discharge; faintly erythematous at border  Neuro: Normal muscle tone, Sensation intact.  Psych: Awake and oriented X 3, normal affect, Insight and Judgment appropriate.     Izora Ribas, NP 2:46 PM [facility]

## 2020-04-16 ENCOUNTER — Ambulatory Visit: Payer: Medicare Other | Admitting: Adult Health

## 2020-04-18 NOTE — Progress Notes (Signed)
Assessment and Plan:  Jocelyn Camacho was seen today for blister.  Diagnoses and all orders for this visit:  Venous stasis ulcer of left lower extremities (HCC) Improved, no concerning signs of infection Emphasized edema management, continue compression, reduce sodium, always elevate extremities while sitting, get up every hour to walk and do ankle exercises Can continue betadine/sugar slurry for new open ulcer, stop once discharge stops and no open wound Follow up in office if any recurrent redness, heat, purulent discharge  Morbid obesity - follow up 3 months for progress monitoring - increase veggies, decrease carbs, reduce sugary snacks, increase fruit, whole grains, beans - long discussion about weight loss, diet, and exercise - Initial weight goal of <240 lb discussed  Further disposition pending results of labs. Discussed med's effects and SE's.   Over 15 minutes of exam, counseling, chart review, and critical decision making was performed.   Future Appointments  Date Time Provider [name]  04/24/2020  7:30 AM Jocelyn Pedro, MD [facility] None  08/06/2020  9:00 AM Jocelyn Comber, NP [facility] None  03/04/2021 10:00 AM Jocelyn Comber, NP [facility] None    ------------------------------------------------------------------------------------------------------------------   HPI BP 128/78   Pulse 73   Temp (!) 97.3 F (36.3 C)   Ht _0  (1.575 m)   Wt 251 lb (113.9 kg)   SpO2 98%   BMI 45.91 kg/m   74 y.o.female with well controlled T2DM with CKD 3b, morbid obesity with chronic peripheral edema and recurrent venous stasis ulcers presents for follow up evaluation of left ankle/pretibial blister, appeared spontaneously 03/29/2020 without notable trigger or injury, concerning for venous stasis ulcer. Compression/elevation was emphasized; doxycycline was initiated due to some localized erythema concerning for early cellulitis.   She reports completed abx without issues, has  been wearing compression hose daily, previous wound is closed/much improved but did develop new smaller ulcer above previous. Appears to be limited to breakdown to fat layer, without purulent discharge, erythema or heat. She admits not walking much, elevates   BMI is Body mass index is 45.91 kg/m., she has been working on diet, feels she needs to cut back on sweet nibbling. Eats a variety of veggies.  Wt Readings from Last 3 Encounters:  04/19/20 251 lb (113.9 kg)  04/02/20 248 lb (112.5 kg)  02/27/20 253 lb 12.8 oz (115.1 kg)    Last A1C in the office was:  Lab Results  Component Value Date   HGBA1C 5.7 (H) 02/27/2020   She has CKD IIIb r/t T2DM, follows with nephrology Lab Results  Component Value Date   GFRAA 42 (L) 02/27/2020     Past Medical History:  Diagnosis Date  . Allergic rhinitis   . Allergy   . Anxiety    with medical procedures  . Hyperlipidemia   . Hypertension   . Obesity   . OSA (obstructive sleep apnea)    wears CPAP  . Type II or unspecified type diabetes mellitus with renal manifestations, not stated as uncontrolled(250.40)   . Unspecified hypothyroidism   . Vitamin D deficiency      Allergies  Allergen Reactions  . Penicillins Swelling  . Sulfa Antibiotics Itching and Rash    Current Outpatient Medications on File Prior to Visit  Medication Sig  . allopurinol (ZYLOPRIM) 300 MG tablet Take 1/2 tablet Daily to Prevent Gout  . aspirin EC 81 MG tablet Take 81 mg by mouth daily.  Marland Kitchen atenolol (TENORMIN) 100 MG tablet Take 1 tablet Daily for BP  . BESIVANCE  0.6 % SUSP Place 1 drop into the left eye 4 (four) times daily.   Marland Kitchen BLACK PEPPER-TURMERIC PO Take by mouth daily.  . Blood Glucose Monitoring Suppl (ONE TOUCH ULTRA 2) w/Device KIT check blood sugar 3 times daily-[id number]  . Cholecalciferol (VITAMIN D3) 5000 units CAPS Take 10,000 Units by mouth daily.  . colchicine 0.6 MG tablet Take 1 tablet Daily to Prevent Gout  . doxycycline (VIBRAMYCIN) 100  MG capsule Take 1 capsule twice daily with food  . furosemide (LASIX) 80 MG tablet Take 1 tablet 2 x /day for Fluid Retention / Ankle Swelling  . hyoscyamine (LEVSIN, ANASPAZ) 0.125 MG tablet Take 1 tablet (0.125 mg total) by mouth every 6 (six) hours as needed.  . insulin NPH-regular Human (NOVOLIN 70/30) (70-30) 100 UNIT/ML injection Inject into the skin. Patient injects 25 units in the AM and 10 units in the PM.   . Lancets (ONETOUCH ULTRASOFT) lancets Use as instructed Dx. Code: E11.22  . Lancets (ONETOUCH ULTRASOFT) lancets Check blood sugar 3 times daily-DX-E10.9  . levothyroxine (SYNTHROID) 125 MCG tablet Take 1 tablet daily on an empty stomach with only water for 30 minutes & no Antacid meds, Calcium or Magnesium for 4 hours & avoid Biotin  . losartan (COZAAR) 100 MG tablet TAKE 1/2 TO 1 TABLET BY MOUTH DAILY  . ONETOUCH ULTRA test strip CHECK BLOOD SUGAR 3 TIMES A DAY  . potassium chloride (K-DUR) 10 MEQ tablet Take 21 tablet Daily  . prednisoLONE acetate (PRED FORTE) 1 % ophthalmic suspension INSTILL 1 DROP INTO EACH EYE TWICE A DAY  . rosuvastatin (CRESTOR) 5 MG tablet Take 1 tablet Daily for Cholesterol   No current facility-administered medications on file prior to visit.    ROS: all negative except above.   Physical Exam:  BP 128/78   Pulse 73   Temp (!) 97.3 F (36.3 C)   Ht _0  (1.575 m)   Wt 251 lb (113.9 kg)   SpO2 98%   BMI 45.91 kg/m   General Appearance: Well dressed morbidly obese AA female in no apparent distress. Eyes: PERRLA, conjunctiva no swelling or erythema ENT/Mouth: mask in place; Hearing normal.  Neck: Supple Respiratory: Respiratory effort normal, BS equal bilaterally without rales, rhonchi, wheezing or stridor.  Cardio: RRR with no MRGs. Brisk peripheral pulses with bil non-pitting edema of ankles and lower legs; R compression in place  Abdomen: Soft, + BS.  Non tender, no guarding, rebound, hernias, masses. Lymphatics: Non tender without  lymphadenopathy.  Musculoskeletal: No obvious deformity, no effusion, slow antal gait Skin: Warm, dry; previous large blister/bulla to left lateral ankle/shin; approx 7 cm x 5 cm area is fully closed; new open ulcer to fat layer superior and lateral, approx 1.5 x 3 cm with serous discharge, no erythema, heat, purulent discharge Neuro: Normal muscle tone, Sensation intact.  Psych: Awake and oriented X 3, normal affect, Insight and Judgment appropriate.     Izora Ribas, NP 11:13 AM [facility]

## 2020-04-19 ENCOUNTER — Other Ambulatory Visit: Payer: Self-pay

## 2020-04-19 ENCOUNTER — Ambulatory Visit (INDEPENDENT_AMBULATORY_CARE_PROVIDER_SITE_OTHER): Payer: Medicare Other | Admitting: Adult Health

## 2020-04-19 ENCOUNTER — Encounter: Payer: Self-pay | Admitting: Adult Health

## 2020-04-19 VITALS — BP 128/78 | HR 73 | Temp 97.3°F | Ht 62.0 in | Wt 251.0 lb

## 2020-04-19 DIAGNOSIS — Z6841 Body Mass Index (BMI) 40.0 and over, adult: Secondary | ICD-10-CM

## 2020-04-19 DIAGNOSIS — L97929 Non-pressure chronic ulcer of unspecified part of left lower leg with unspecified severity: Secondary | ICD-10-CM

## 2020-04-19 DIAGNOSIS — I83029 Varicose veins of left lower extremity with ulcer of unspecified site: Secondary | ICD-10-CM

## 2020-04-19 NOTE — Patient Instructions (Addendum)
Goals    . DIET - REDUCE SUGAR INTAKE    . Exercise 5 x per week (20 min per time)    . Weight (lb) < 240 lb (108.9 kg)       Put feet up whenever you are sitting down   Compression hose daily   Betadine sugar on open area only   Get up and walk 5 min every hour     Try snacking on fresh or frozen fruit -  Try combining with nuts or a scoop of peanut butter instead of cookies and snacks   Think about adding daily serving or two of beans and whole grains  A great goal to work towards is aiming to get in a serving daily of some of the most nutritionally dense foods - G- BOMBS daily             Exercises To Do While Sitting  Exercises that you do while sitting (chair exercises) can give you many of the same benefits as full exercise. Benefits include strengthening your heart, burning calories, and keeping muscles and joints healthy. Exercise can also improve your mood and help with depression and anxiety. You may benefit from chair exercises if you are unable to do standing exercises because of:  Diabetic foot pain.  Obesity.  Illness.  Arthritis.  Recovery from surgery or injury.  Breathing problems.  Balance problems.  Another type of disability. Before starting chair exercises, check with your health care provider or a physical therapist to find out how much exercise you can tolerate and which exercises are safe for you. If your health care provider approves:  Start out slowly and build up over time. Aim to work up to about 10-20 minutes for each exercise session.  Make exercise part of your daily routine.  Drink water when you exercise. Do not wait until you are thirsty. Drink every 10-15 minutes.  Stop exercising right away if you have pain, nausea, shortness of breath, or dizziness.  If you are exercising in a wheelchair, make sure to lock the wheels.  Ask your health care provider whether you can do tai chi or yoga. Many positions in these  mind-body exercises can be modified to do while seated. Warm-up Before starting other exercises: 1. Sit up as straight as you can. Have your knees bent at 90 degrees, which is the shape of the capital letter "L." Keep your feet flat on the floor. 2. Sit at the front edge of your chair, if you can. 3. Pull in (tighten) the muscles in your abdomen and stretch your spine and neck as straight as you can. Hold this position for a few minutes. 4. Breathe in and out evenly. Try to concentrate on your breathing, and relax your mind. Stretching Exercise A: Arm stretch 1. Hold your arms out straight in front of your body. 2. Bend your hands at the wrist with your fingers pointing up, as if signaling someone to stop. Notice the slight tension in your forearms as you hold the position. 3. Keeping your arms out and your hands bent, rotate your hands outward as far as you can and hold this stretch. Aim to have your thumbs pointing up and your pinkie fingers pointing down. Slowly repeat arm stretches for one minute as tolerated. Exercise B: Leg stretch 1. If you can move your legs, try to "draw" letters on the floor with the toes of your foot. Write your name with one foot. 2. Write your name with  the toes of your other foot. Slowly repeat the movements for one minute as tolerated. Exercise C: Reach for the sky 1. Reach your hands as far over your head as you can to stretch your spine. 2. Move your hands and arms as if you are climbing a rope. Slowly repeat the movements for one minute as tolerated. Range of motion exercises Exercise A: Shoulder roll 1. Let your arms hang loosely at your sides. 2. Lift just your shoulders up toward your ears, then let them relax back down. 3. When your shoulders feel loose, rotate your shoulders in backward and forward circles. Do shoulder rolls slowly for one minute as tolerated. Exercise B: March in place 1. As if you are marching, pump your arms and lift your legs up  and down. Lift your knees as high as you can. ? If you are unable to lift your knees, just pump your arms and move your ankles and feet up and down. March in place for one minute as tolerated. Exercise C: Seated jumping jacks 1. Let your arms hang down straight. 2. Keeping your arms straight, lift them up over your head. Aim to point your fingers to the ceiling. 3. While you lift your arms, straighten your legs and slide your heels along the floor to your sides, as wide as you can. 4. As you bring your arms back down to your sides, slide your legs back together. ? If you are unable to use your legs, just move your arms. Slowly repeat seated jumping jacks for one minute as tolerated. Strengthening exercises Exercise A: Shoulder squeeze 1. Hold your arms straight out from your body to your sides, with your elbows bent and your fists pointed at the ceiling. 2. Keeping your arms in the bent position, move them forward so your elbows and forearms meet in front of your face. 3. Open your arms back out as wide as you can with your elbows still bent, until you feel your shoulder blades squeezing together. Hold for 5 seconds. Slowly repeat the movements forward and backward for one minute as tolerated. Contact a health care provider if you:  Had to stop exercising due to any of the following: ? Pain. ? Nausea. ? Shortness of breath. ? Dizziness. ? Fatigue.  Have significant pain or soreness after exercising. Get help right away if you have:  Chest pain.  Difficulty breathing. These symptoms may represent a serious problem that is an emergency. Do not wait to see if the symptoms will go away. Get medical help right away. Call your local emergency services (911 in the [place] Do not drive yourself to the hospital. This information is not intended to replace advice given to you by your health care provider. Make sure you discuss any questions you have with your health care provider. Document  Revised: 03/31/2019 Document Reviewed: 10/21/2017 Elsevier Patient Education  2020 [employer].

## 2020-04-24 ENCOUNTER — Encounter (INDEPENDENT_AMBULATORY_CARE_PROVIDER_SITE_OTHER): Payer: Medicare Other | Admitting: Ophthalmology

## 2020-04-24 DIAGNOSIS — H43813 Vitreous degeneration, bilateral: Secondary | ICD-10-CM

## 2020-04-24 DIAGNOSIS — H35033 Hypertensive retinopathy, bilateral: Secondary | ICD-10-CM | POA: Diagnosis not present

## 2020-04-24 DIAGNOSIS — H34812 Central retinal vein occlusion, left eye, with macular edema: Secondary | ICD-10-CM

## 2020-04-24 DIAGNOSIS — I10 Essential (primary) hypertension: Secondary | ICD-10-CM

## 2020-05-13 ENCOUNTER — Other Ambulatory Visit: Payer: Self-pay | Admitting: Adult Health

## 2020-05-16 IMAGING — US RENAL/URINARY TRACT ULTRASOUND
1 series · 14 of 25 positions shown · non-contrast
Comparison: No recent prior.

CLINICAL DATA: Chronic renal disease.

EXAM:
RENAL / URINARY TRACT ULTRASOUND COMPLETE

[Series 1: renal/urinary tract ultrasound · 0.22mm/px · 14 of 32 slices shown]
[im 1/32]
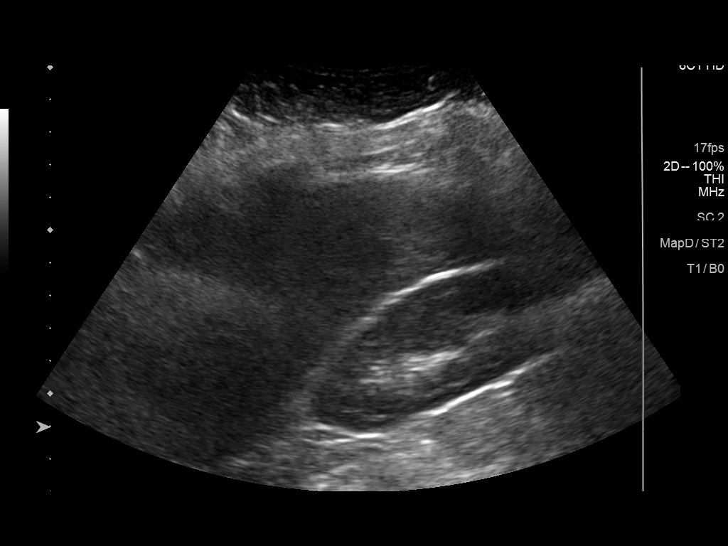
[im 3/32]
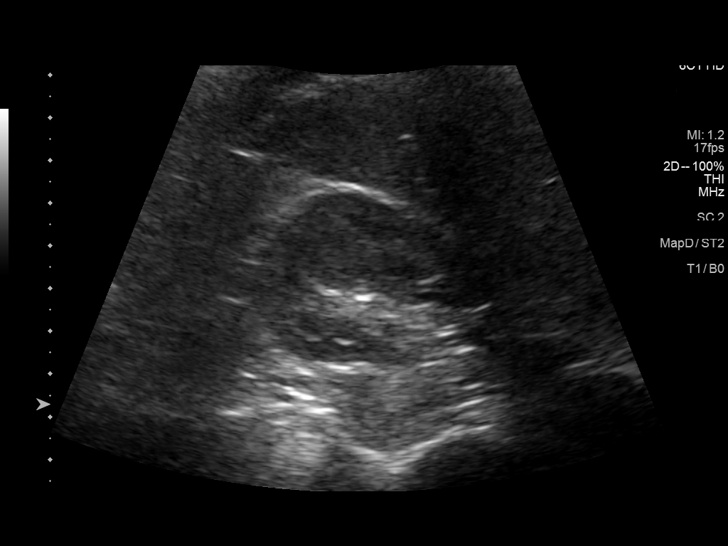
[im 6/32]
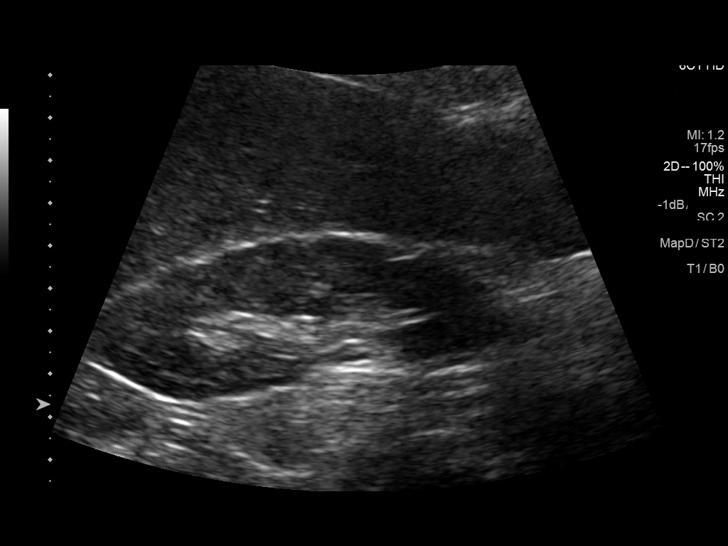
[im 8/32]
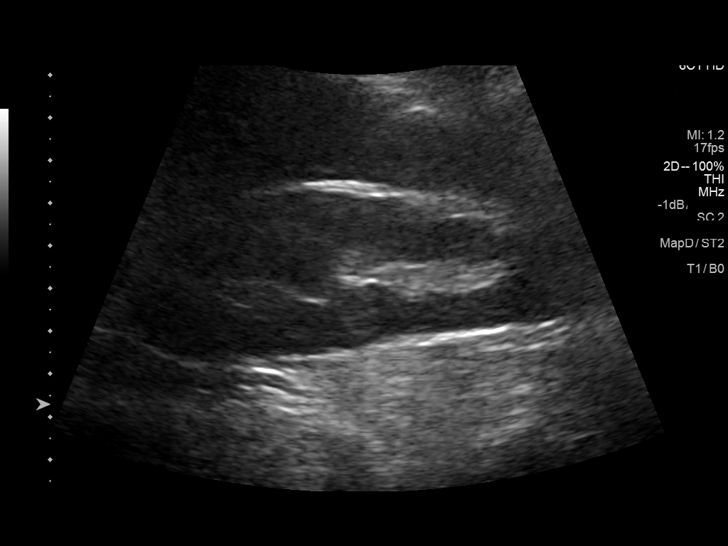
[im 11/32]
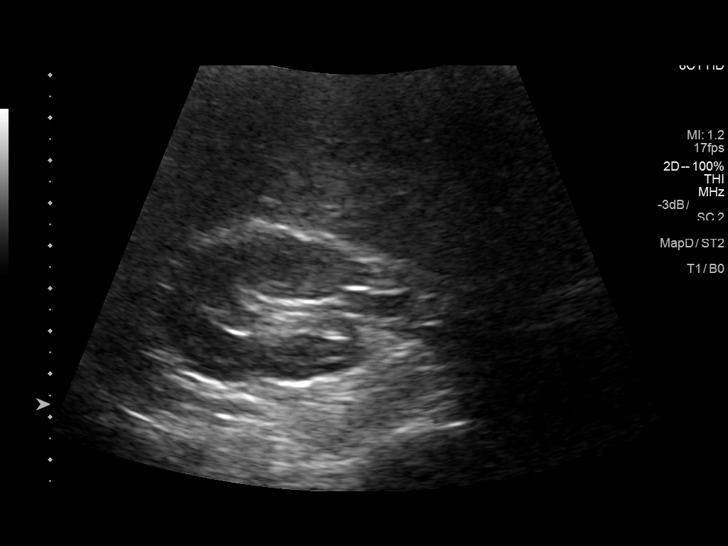
[im 12/32]
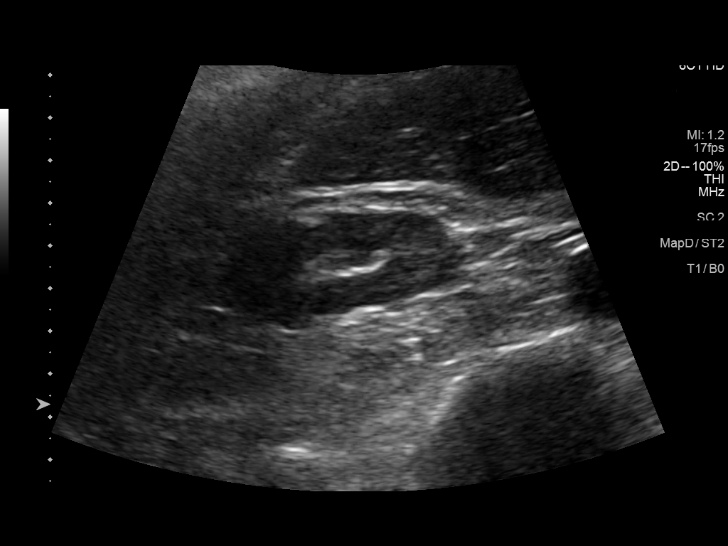
[im 15/32]
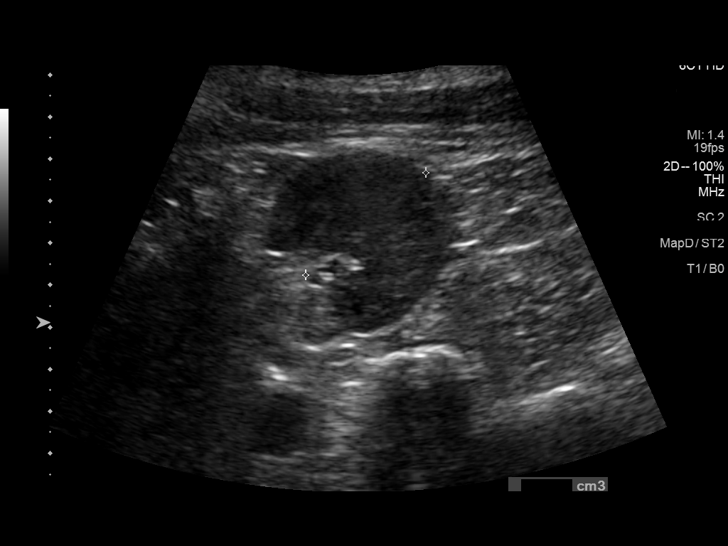
[im 17/32]
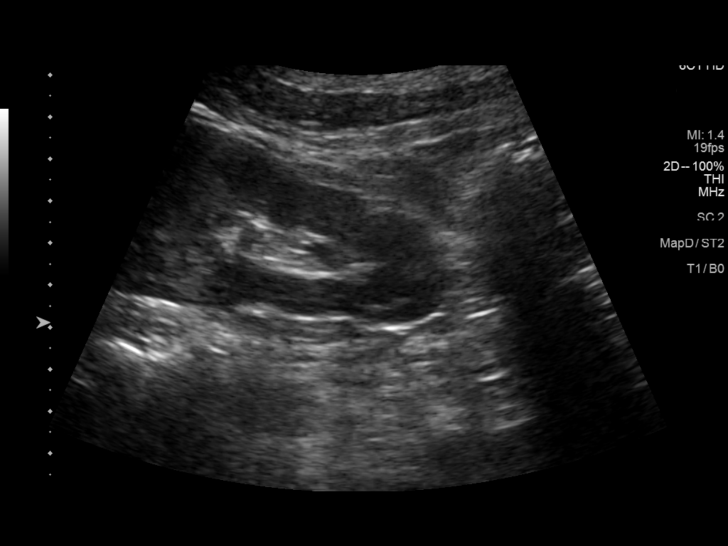
[im 20/32]
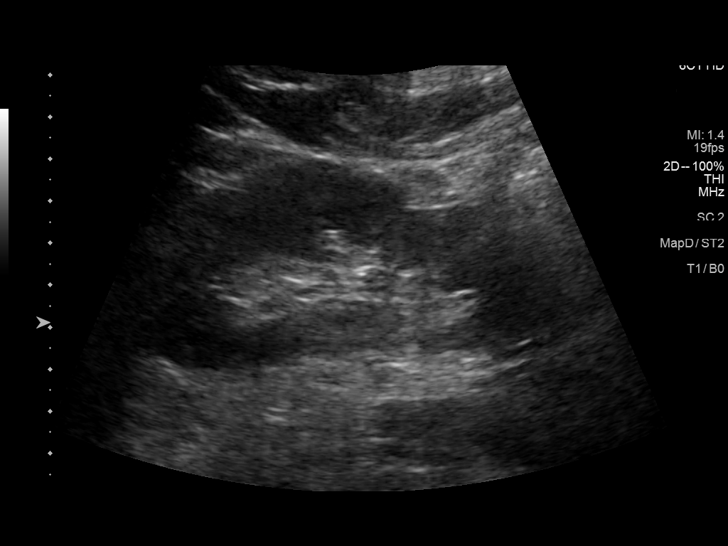
[im 21/32]
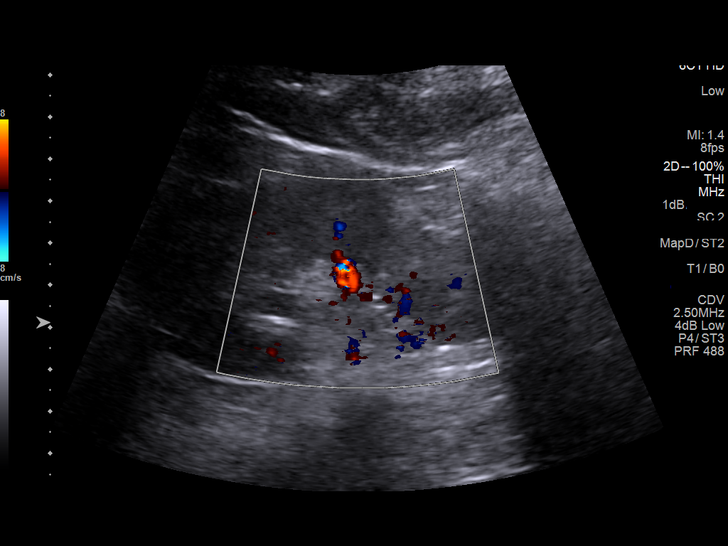
[im 24/32]
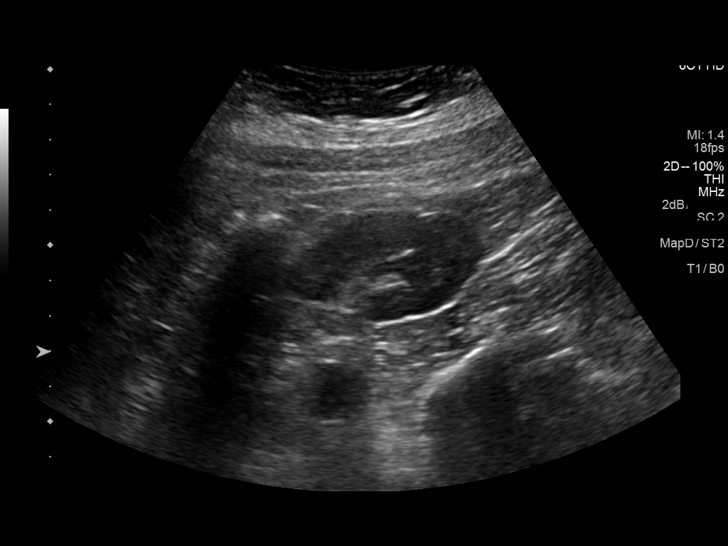
[im 26/32]
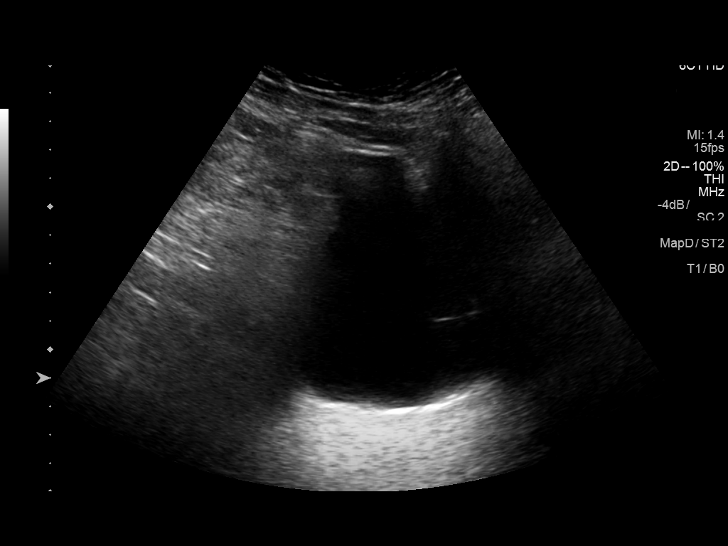
[im 29/32]
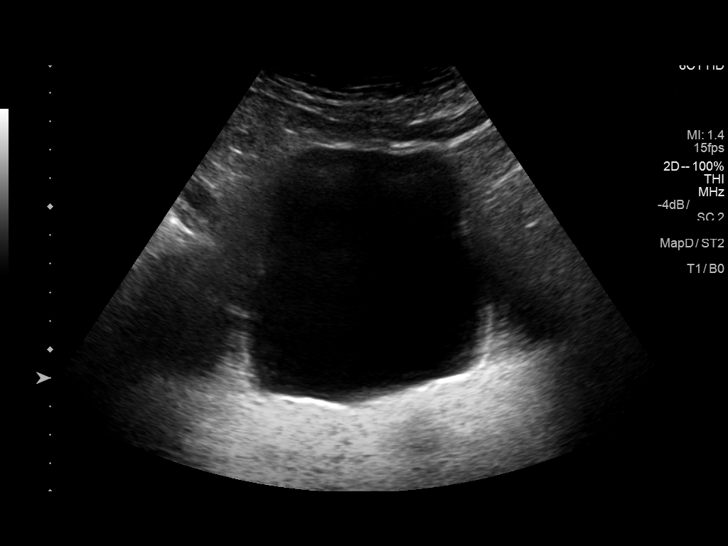
[im 32/32]
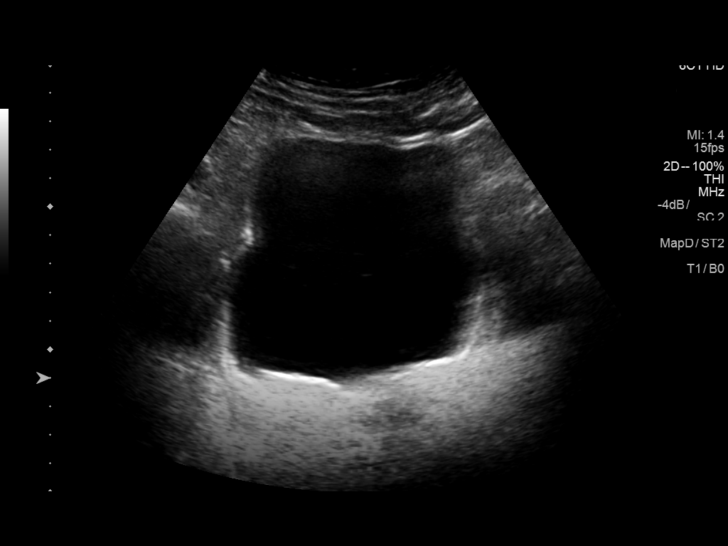

[14 of 25 positions shown; findings below may reference images not displayed]

FINDINGS: Right Kidney:

Renal measurements: 10.2 x 3.4 x 4.3 = volume: 75 mL . Echogenicity
within normal limits. No mass or hydronephrosis visualized.

Left Kidney:

Renal measurements: 10.2 x 4.3 x 3.8 = volume: 85 mL. Echogenicity
within normal limits. No mass or hydronephrosis visualized.

Bladder:

Appears normal for degree of bladder distention.
IMPRESSION: No acute abnormality.  No evidence of hydronephrosis.

## 2020-06-05 ENCOUNTER — Encounter (INDEPENDENT_AMBULATORY_CARE_PROVIDER_SITE_OTHER): Payer: Medicare Other | Admitting: Ophthalmology

## 2020-06-05 ENCOUNTER — Other Ambulatory Visit: Payer: Self-pay

## 2020-06-05 DIAGNOSIS — H34812 Central retinal vein occlusion, left eye, with macular edema: Secondary | ICD-10-CM | POA: Diagnosis not present

## 2020-06-05 DIAGNOSIS — H43813 Vitreous degeneration, bilateral: Secondary | ICD-10-CM

## 2020-06-05 DIAGNOSIS — H35033 Hypertensive retinopathy, bilateral: Secondary | ICD-10-CM | POA: Diagnosis not present

## 2020-06-05 DIAGNOSIS — I10 Essential (primary) hypertension: Secondary | ICD-10-CM | POA: Diagnosis not present

## 2020-06-14 LAB — HM DIABETES EYE EXAM

## 2020-06-28 ENCOUNTER — Encounter: Payer: Self-pay | Admitting: *Deleted

## 2020-07-17 ENCOUNTER — Other Ambulatory Visit: Payer: Self-pay

## 2020-07-17 ENCOUNTER — Encounter (INDEPENDENT_AMBULATORY_CARE_PROVIDER_SITE_OTHER): Payer: Medicare Other | Admitting: Ophthalmology

## 2020-07-17 DIAGNOSIS — H43813 Vitreous degeneration, bilateral: Secondary | ICD-10-CM | POA: Diagnosis not present

## 2020-07-17 DIAGNOSIS — H35033 Hypertensive retinopathy, bilateral: Secondary | ICD-10-CM

## 2020-07-17 DIAGNOSIS — I10 Essential (primary) hypertension: Secondary | ICD-10-CM | POA: Diagnosis not present

## 2020-07-17 DIAGNOSIS — H34812 Central retinal vein occlusion, left eye, with macular edema: Secondary | ICD-10-CM

## 2020-07-20 ENCOUNTER — Other Ambulatory Visit: Payer: Self-pay | Admitting: Internal Medicine

## 2020-08-02 DIAGNOSIS — I878 Other specified disorders of veins: Secondary | ICD-10-CM | POA: Insufficient documentation

## 2020-08-02 NOTE — Progress Notes (Signed)
Complete Physical  Assessment and Plan:  Diagnoses and all orders for this visit:  Encounter for routine adult health examination with abnormal findings  Essential hypertension Continue medication Monitor blood pressure at home; call if consistently over 130/80 Continue DASH diet.   Reminder to go to the ER if any CP, SOB, nausea, dizziness, severe HA, changes vision/speech, left arm numbness and tingling and jaw pain.  OSA (obstructive sleep apnea) Wears CPAP inconsistently, emphasized adherence Reviewed risks of untreated sleep apnea No reported barriers or equipment needs  Hyperlipidemia associated with type 2 diabetes ([facility]) Currently statin deferred due to fair control off of medications Continue low cholesterol diet and exercise.  Check lipid panel.   Hypothyroidism, unspecified type continue medications the same pending lab results reminded to take on an empty stomach 30-60mns before food.  check TSH level  Type 2 diabetes mellitus with stage 2 chronic kidney disease, without Camacho-term current use of insulin ([facility] Education: Reviewed 'ABCs' of diabetes management (respective goals in parentheses):  A1C (<7), blood pressure (<130/80), and cholesterol (LDL <70) Eye Exam yearly and Dental Exam every 6 months. 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 Monitor Ca+, renal functions, bone density   Vitamin D deficiency Continue to recommend supplementation for goal of 70-100 Check vitamin D level  Obesity, morbid ([facility] discussion about weight loss, diet, and exercise Recommended diet heavy in fruits and veggies and low in animal meats, cheeses, and dairy products, appropriate calorie  intake Patient will work on regular exercise program, accountibitlity partner  Discussed appropriate weight for height and initial goal (240 lb) Follow up at next visit  Macular degeneration, unspecified laterality, unspecified type Followed by ophthalmology  Chronic gout without tophus, unspecified cause, unspecified site Continue allopurinol, colchicine per ortho Diet discussed Check uric acid   Generalized anxiety disorder Recently well controlled; uses benzo PRN prior to eye injections Stress management techniques discussed, increase water, good sleep hygiene discussed, increase exercise, and increase veggies.   Anemia, unspecified type Normal iron, B12, likely anemia of chronic disease secondary to renal function Recheck iron, check folate, retics   Murmur No heard today Aortic calcification with EF preserved per ECHO 05/2018 Monitor Weight loss encouraged  Orders Placed This Encounter  Procedures  . CBC with Differential/Platelet  . COMPLETE METABOLIC PANEL WITH GFR  . Magnesium  . Lipid panel  . TSH  . Hemoglobin A1c  . VITAMIN D 25 Hydroxy (Vit-D Deficiency, Fractures)  . Microalbumin / creatinine urine ratio  . Urinalysis, Routine w reflex microscopic  . Iron  . Ferritin  . Reticulocytes  . Uric acid  . EKG 12-Lead  . HM DIABETES FOOT EXAM     Discussed med's effects and SE's. Screening labs and tests as requested with regular follow-up as recommended. Over 40 minutes of exam, counseling, chart review, and complex, high level critical decision making was performed this visit.   Future Appointments  Date Time Provider Joc[facility]acho 08/28/2020  7:45 AM Jocelyn Camacho [facility] None  03/04/2021 10:00 AM Jocelyn Camacho [facility] None  08/08/2021  9:00 AM Jocelyn Camacho [facility] None     HPI  74y.o. female  presents for a complete physical and follow up for has Essential hypertension; Type 2 diabetes mellitus with  stage 3 chronic kidney  disease, with Camacho-term current use of insulin ([facility]); Vitamin D deficiency; OSA (obstructive sleep apnea); Hypothyroidism; Medication management; Macular degeneration; Generalized anxiety disorder; Gout; Hyperlipidemia associated with type 2 diabetes mellitus ([facility]); CKD stage 3 due to type 2 diabetes mellitus ([facility]); Anemia; Diabetic retinopathy associated with type 2 diabetes mellitus ([facility]); Class 3 severe obesity due to excess calories with serious comorbidity and body mass index (BMI) of 45.0 to 49.9 in adult [facility]); Hyperparathyroidism, secondary renal ([facility]); and Venous stasis of both lower extremities on their problem list.   Married, 3 kids, 1 is hers, 1 grandchild; she is a retired Product manager.   she has situational anxiety, takes valium 5 mg once daily PRN, the night before she goes to get eye injections, hasn't needed in several months.    She is following [facility] and has been advised may need TKA at some point. She will take tylenol only if needed, uses OTC topicals and takes tumeric/bioprene supplement daily. Getting cortisone and gel shots, most recent last Thursday with perceived.   She has OSA on CPAP, she admits poor adherence, <50%, denies needs, "just need to do it."   BMI is Body mass index is 45.87 kg/m., she has not been working on diet, limited by knee arthritis. She does have silver sneakers but hasn't been using due to covid 19. She reports she has been successful with 90# weight loss on weight watchers but doesn't feel like it has helped as much recently, attributes to lack of exercise. Discussed options at length; she declines all weight loss medications at this time.  Wt Readings from Last 3 Encounters:  08/06/20 250 lb 12.8 oz (113.8 kg)  04/19/20 251 lb (113.9 kg)  04/02/20 248 lb (112.5 kg)   Her blood pressure has been controlled at home, today their BP is BP: 130/74 She does not workout. She denies  chest pain, shortness of breath, dizziness.   She is not on cholesterol medication and denies myalgias. Her LDL cholesterol is at goal. The cholesterol last visit was:   Lab Results  Component Value Date   CHOL 113 02/27/2020   HDL 35 (L) 02/27/2020   LDLCALC 59 02/27/2020   TRIG 111 02/27/2020   CHOLHDL 3.2 02/27/2020   She has not been working on diet and exercise for T2DM (off of metformin due to kidneys novolin 70/30 - 25 units AM, 10 units PM), she is on bASA, she is on ACE/ARB and denies foot ulcerations, hyperglycemia, hypoglycemia , increased appetite, nausea, paresthesia of the feet, polydipsia, polyuria, visual disturbances, vomiting and weight loss. She reports fasting ranges 110-120s. Last A1C in the office was:  Lab Results  Component Value Date   HGBA1C 5.7 (H) 02/27/2020   She is on thyroid medication. Her medication was not changed last visit.   Lab Results  Component Value Date   TSH 1.36 02/27/2020   Last GFR: Lab Results  Component Value Date   GFRAA 42 (L) 02/27/2020  She is followed by nephrology Dr. Posey Pronto and [facility].    She has hyperparathyroid secondary to renal function decline:  Lab Results  Component Value Date   PTH 105 (H) 01/04/2019   CALCIUM 9.8 02/27/2020   Patient is on allopurinol for gout and does not report a recent flare, was instructed to start taking colchicine daily by ortho and has done well since.  Lab Results  Component Value Date  LABURIC 6.6 11/23/2019   Patient is on Vitamin D supplement.   Lab Results  Component Value Date   VD25OH 73 11/23/2019     Has persistent anemia;  Lab Results  Component Value Date   WBC 7.1 02/27/2020   HGB 10.8 (L) 02/27/2020   HCT 34.3 (L) 02/27/2020   MCV 89.6 02/27/2020   PLT 313 02/27/2020   Hx of iron def; not recently on supplement;  Lab Results  Component Value Date   IRON 60 05/27/2018   TIBC 295 05/27/2018   FERRITIN 251 05/27/2018   Lab Results  Component Value Date    VITAMINB12 >2,000 (H) 08/04/2019     Current Medications:  Current Outpatient Medications on File Prior to Visit  Medication Sig Dispense Refill  . allopurinol (ZYLOPRIM) 300 MG tablet Take 1/2 tablet Daily to Prevent Gout 90 tablet 1  . aspirin EC 81 MG tablet Take 81 mg by mouth daily.    Marland Kitchen atenolol (TENORMIN) 100 MG tablet Take 1 tablet by mouth once daily for blood pressure 90 tablet 0  . BESIVANCE 0.6 % SUSP Place 1 drop into the left eye 4 (four) times daily.     Marland Kitchen BLACK PEPPER-TURMERIC PO Take by mouth daily.    . Blood Glucose Monitoring Suppl (ONE TOUCH ULTRA 2) w/Device KIT check blood sugar 3 times daily-DX-E10.9 1 each 0  . Cholecalciferol (VITAMIN D3) 5000 units CAPS Take 10,000 Units by mouth daily.    . colchicine 0.6 MG tablet Take 1 tablet Daily to Prevent Gout 90 tablet 3  . furosemide (LASIX) 80 MG tablet Take 1 tablet 2 x /day for Fluid Retention / Ankle Swelling 180 tablet 3  . hyoscyamine (LEVSIN, ANASPAZ) 0.125 MG tablet Take 1 tablet (0.125 mg total) by mouth every 6 (six) hours as needed. 60 tablet 1  . insulin NPH-regular Human (NOVOLIN 70/30) (70-30) 100 UNIT/ML injection Inject into the skin. Patient injects 25 units in the AM and 10 units in the PM.     . Lancets (ONETOUCH ULTRASOFT) lancets Use as instructed Dx. Code: E11.22 100 each 12  . Lancets (ONETOUCH ULTRASOFT) lancets Check blood sugar 3 times daily-DX-E10.9 300 each 1  . levothyroxine (SYNTHROID) 125 MCG tablet Take 1 tablet daily on an empty stomach with only water for 30 minutes & no Antacid meds, Calcium or Magnesium for 4 hours & avoid Biotin 90 tablet 3  . losartan (COZAAR) 100 MG tablet Take 1 tablet Daily for BP & Diabetic Kidney Protection 90 tablet 0  . ONETOUCH ULTRA test strip CHECK BLOOD SUGAR 3 TIMES A DAY 300 strip 3  . potassium chloride (KLOR-CON) 10 MEQ tablet TAKE 1 TABLET BY MOUTH EVERY DAY 90 tablet 3  . prednisoLONE acetate (PRED FORTE) 1 % ophthalmic suspension INSTILL 1 DROP  INTO EACH EYE TWICE A DAY 5 mL 0  . rosuvastatin (CRESTOR) 5 MG tablet Take 1 tablet Daily for Cholesterol 90 tablet 3  . doxycycline (VIBRAMYCIN) 100 MG capsule Take 1 capsule twice daily with food 28 capsule 0   No current facility-administered medications on file prior to visit.   Allergies:  Allergies  Allergen Reactions  . Penicillins Swelling  . Sulfa Antibiotics Itching and Rash   Medical History:  She has Essential hypertension; Type 2 diabetes mellitus with stage 3 chronic kidney disease, with Camacho-term current use of insulin ([place]); Vitamin D deficiency; OSA (obstructive sleep apnea); Hypothyroidism; Medication management; Macular degeneration; Generalized anxiety disorder; Gout; Hyperlipidemia associated with type 2  diabetes mellitus ([place]); CKD stage 3 due to type 2 diabetes mellitus ([place]); Anemia; Diabetic retinopathy associated with type 2 diabetes mellitus ([place]); Class 3 severe obesity due to excess calories with serious comorbidity and body mass index (BMI) of 45.0 to 49.9 in adult [facility]); Hyperparathyroidism, secondary renal ([place]); and Venous stasis of both lower extremities on their problem list. Health Maintenance:   Immunization History  Administered Date(s) Administered  . DT (Pediatric) 06/19/2015  . DTaP 12/09/2004  . Pneumococcal Conjugate-13 12/19/2014  . Pneumococcal Polysaccharide-23 03/02/2009, 01/17/2019   Last colonoscopy: 03/2014 normal Last mammogram: 08/2019, reminded to schedule Last pap smear/pelvic exam: 2012, DONE DEXA: 11/2015 normal, declines for now  Carotid US 2013 CXR 2009  ECHO: 06/29/2018 - Aortic valve: Moderate focal calcification involving the left   coronary cusp. EF 94-76%, grade 1 diastolic dysfunction  Prior vaccinations: TD or Tdap: 2016 Influenza: declines Pneumococcal: 2010, 2019 Prevnar 13: 2015 Shingles/Zostavax: declines  Covid 19: 2/2, 2021, pfizer, pending receipt of card  Eye exam Dr. Zigmund Daniel 12/27/2019, retinopathy,  follows q6w for injections, Report received and abstracted Dental: Dr. Delma Freeze, last exam a few years ago, has partial, will schedule  Foot exam: 08/04/2019 - today   Patient Care Team: [name], Camacho as PCP - General (Internal Medicine) Warden Fillers, Camacho as Consulting Physician (Optometry) [name], Camacho as Consulting Physician (Ophthalmology) Inda Castle, Camacho (Inactive) as Consulting Physician (Gastroenterology) Druscilla Brownie, Camacho as Consulting Physician (Dermatology) Allyn Kenner, Camacho (Dermatology)  Surgical History:  She has a past surgical history that includes  eye procedure; Tubal ligation; and Dental examination under anesthesia. Family History:  Herfamily history includes Breast cancer in her mother; Diabetes in her maternal grandmother and sister; Hypertension in her sister; Renal Disease in her mother. Social History:  She reports that she has never smoked. She has never used smokeless tobacco. She reports current alcohol use. She reports that she does not use drugs.  Review of Systems: Review of Systems  Constitutional: Negative for malaise/fatigue and weight loss.  HENT: Negative for hearing loss and tinnitus.   Eyes: Negative for blurred vision and double vision.  Respiratory: Negative for cough, sputum production, shortness of breath and wheezing.   Cardiovascular: Negative for chest pain, palpitations, orthopnea, claudication, leg swelling and PND.  Gastrointestinal: Negative for abdominal pain, blood in stool, constipation, diarrhea, heartburn, melena, nausea and vomiting.  Genitourinary: Negative.   Musculoskeletal: Positive for joint pain (bil knees). Negative for falls and myalgias.  Skin: Negative for rash.  Neurological: Negative for dizziness, tingling, sensory change, weakness and headaches.  Endo/Heme/Allergies: Negative for polydipsia.  Psychiatric/Behavioral: Negative.  Negative for depression, memory loss, substance abuse and suicidal  ideas. The patient is not nervous/anxious and does not have insomnia.   All other systems reviewed and are negative.   Physical Exam: Estimated body mass index is 45.87 kg/m as calculated from the following:   Height as of this encounter: _0  (1.575 m).   Weight as of this encounter: 250 lb 12.8 oz (113.8 kg). BP 130/74   Pulse 87   Temp (!) 97.5 F (36.4 C)   Ht _1  (1.575 m)   Wt 250 lb 12.8 oz (113.8 kg)   SpO2 97%   BMI 45.87 kg/m  General Appearance: Well nourished, well dressed, morbidly obese AA elder female in no apparent distress.  Eyes: PERRLA, EOMs, conjunctiva no swelling or erythema Sinuses: No Frontal/maxillary tenderness  ENT/Mouth: Ext aud canals clear, normal light reflex with TMs without erythema,  bulging. Fair dentition; partials. No erythema, swelling, or exudate on post pharynx. Tonsils not swollen or erythematous. Hearing normal.  Neck: Supple, thyroid normal. No bruits  Respiratory: Respiratory effort normal, BS equal bilaterally without rales, rhonchi, wheezing or stridor.  Cardio: RRR withou, rubs or gallops, no audible murmur. Symmetrical peripheral pulses without edema after removal of compression hose Chest: symmetric, with normal excursions and percussion.  Breasts: Symmetric, without lumps, nipple discharge, retractions.  Abdomen: Soft, obese, nontender, no guarding, rebound, hernias, masses, or organomegaly.  Lymphatics: Non tender without lymphadenopathy.  Genitourinary: Defer, no concerns Musculoskeletal: Full ROM all peripheral extremities,5/5 strength, and antalgic gait.  Skin: Warm, dry without rashes, lesions, ecchymosis. Neuro: Cranial nerves intact, reflexes equal bilaterally. Normal muscle tone, no cerebellar symptoms. Sensation intact bil feel monofilament Psych: Awake and oriented X 3, normal affect, Insight and Judgment appropriate.   EKG: No ST changes, left ventricular hypertrophy by voltage unchanged from previous  No hypertrophy  per ECHO 06/29/2018  Joc[facility] 9:11 AM [facility]

## 2020-08-06 ENCOUNTER — Encounter: Payer: Self-pay | Admitting: Adult Health

## 2020-08-06 ENCOUNTER — Ambulatory Visit (INDEPENDENT_AMBULATORY_CARE_PROVIDER_SITE_OTHER): Payer: Medicare Other | Admitting: Adult Health

## 2020-08-06 ENCOUNTER — Other Ambulatory Visit: Payer: Self-pay

## 2020-08-06 VITALS — BP 130/74 | HR 87 | Temp 97.5°F | Ht 62.0 in | Wt 250.8 lb

## 2020-08-06 DIAGNOSIS — M1A9XX0 Chronic gout, unspecified, without tophus (tophi): Secondary | ICD-10-CM

## 2020-08-06 DIAGNOSIS — F411 Generalized anxiety disorder: Secondary | ICD-10-CM

## 2020-08-06 DIAGNOSIS — N183 Chronic kidney disease, stage 3 unspecified: Secondary | ICD-10-CM

## 2020-08-06 DIAGNOSIS — E11319 Type 2 diabetes mellitus with unspecified diabetic retinopathy without macular edema: Secondary | ICD-10-CM

## 2020-08-06 DIAGNOSIS — Z0000 Encounter for general adult medical examination without abnormal findings: Secondary | ICD-10-CM

## 2020-08-06 DIAGNOSIS — Z6841 Body Mass Index (BMI) 40.0 and over, adult: Secondary | ICD-10-CM

## 2020-08-06 DIAGNOSIS — D649 Anemia, unspecified: Secondary | ICD-10-CM

## 2020-08-06 DIAGNOSIS — E1122 Type 2 diabetes mellitus with diabetic chronic kidney disease: Secondary | ICD-10-CM

## 2020-08-06 DIAGNOSIS — E039 Hypothyroidism, unspecified: Secondary | ICD-10-CM

## 2020-08-06 DIAGNOSIS — Z136 Encounter for screening for cardiovascular disorders: Secondary | ICD-10-CM

## 2020-08-06 DIAGNOSIS — I878 Other specified disorders of veins: Secondary | ICD-10-CM

## 2020-08-06 DIAGNOSIS — E1169 Type 2 diabetes mellitus with other specified complication: Secondary | ICD-10-CM

## 2020-08-06 DIAGNOSIS — I10 Essential (primary) hypertension: Secondary | ICD-10-CM | POA: Diagnosis not present

## 2020-08-06 DIAGNOSIS — N2581 Secondary hyperparathyroidism of renal origin: Secondary | ICD-10-CM

## 2020-08-06 DIAGNOSIS — G4733 Obstructive sleep apnea (adult) (pediatric): Secondary | ICD-10-CM

## 2020-08-06 DIAGNOSIS — E559 Vitamin D deficiency, unspecified: Secondary | ICD-10-CM

## 2020-08-06 DIAGNOSIS — H3530 Unspecified macular degeneration: Secondary | ICD-10-CM

## 2020-08-06 DIAGNOSIS — Z79899 Other long term (current) drug therapy: Secondary | ICD-10-CM

## 2020-08-06 DIAGNOSIS — E785 Hyperlipidemia, unspecified: Secondary | ICD-10-CM

## 2020-08-06 NOTE — Patient Instructions (Addendum)
[name] , Thank you for taking time to come for your Annual Wellness Visit. I appreciate your ongoing commitment to your health goals. Please review the following plan we discussed and let me know if I can assist you in the future.   These are the goals we discussed: Goals    . DIET - REDUCE SUGAR INTAKE    . Exercise 5 x per week (20 min per time)    . Weight (lb) < 240 lb (108.9 kg)       This is a list of the screening recommended for you and due dates:  Health Maintenance  Topic Date Due  . COVID-19 Vaccine (1) Never done  . Flu Shot  07/22/2020  . Complete foot exam   08/03/2020  . Hemoglobin A1C  08/29/2020  . Eye exam for diabetics  08/06/2021  . Mammogram  09/11/2021  . Tetanus Vaccine  02/27/2024  . Colon Cancer Screening  04/17/2024  . DEXA scan (bone density measurement)  Completed  .  Hepatitis C: One time screening is recommended by Center for Disease Control  (CDC) for  adults born from [birth year] through 1965.   Completed  . Pneumonia vaccines  Completed     Please work on CPAP machine 80+ % of the time for heart health   Please follow up with Dr. Delma Freeze  Please come up with a exercise plan and accountability partner or plan   Recommend keeping a log of food intake so we can review together   Weigh once a week and keep a log; aim to lose 0.5-1 lb per week    Know what a healthy weight is for you (roughly BMI <25) and aim to maintain this  Aim for 7+ servings of fruits and vegetables daily  65-80+ fluid ounces of water or unsweet tea for healthy kidneys  Limit to max 1 drink of alcohol per day; avoid smoking/tobacco  Limit animal fats in diet for cholesterol and heart health - choose grass fed whenever available  Avoid highly processed foods, and foods high in saturated/trans fats  Aim for low stress - take time to unwind and care for your mental health  Aim for 150 min of moderate intensity exercise weekly for heart health, and weights twice weekly  for bone health  Aim for 7-9 hours of sleep daily         Exercising to Stay Healthy To become healthy and stay healthy, it is recommended that you do moderate-intensity and vigorous-intensity exercise. You can tell that you are exercising at a moderate intensity if your heart starts beating faster and you start breathing faster but can still hold a conversation. You can tell that you are exercising at a vigorous intensity if you are breathing much harder and faster and cannot hold a conversation while exercising. Exercising regularly is important. It has many health benefits, such as:  Improving overall fitness, flexibility, and endurance.  Increasing bone density.  Helping with weight control.  Decreasing body fat.  Increasing muscle strength.  Reducing stress and tension.  Improving overall health. How often should I exercise? Choose an activity that you enjoy, and set realistic goals. Your health care provider can help you make an activity plan that works for you. Exercise regularly as told by your health care provider. This may include:  Doing strength training two times a week, such as: ? Lifting weights. ? Using resistance bands. ? Push-ups. ? Sit-ups. ? Yoga.  Doing a certain intensity of exercise  for a given amount of time. Choose from these options: ? A total of 150 minutes of moderate-intensity exercise every week. ? A total of 75 minutes of vigorous-intensity exercise every week. ? A mix of moderate-intensity and vigorous-intensity exercise every week. Children, pregnant women, people who have not exercised regularly, people who are overweight, and older adults may need to talk with a health care provider about what activities are safe to do. If you have a medical condition, be sure to talk with your health care provider before you start a new exercise program. What are some exercise ideas? Moderate-intensity exercise ideas include:  Walking 1 mile (1.6 km)  in about 15 minutes.  Biking.  Hiking.  Golfing.  Dancing.  Water aerobics. Vigorous-intensity exercise ideas include:  Walking 4.5 miles (7.2 km) or more in about 1 hour.  Jogging or running 5 miles (8 km) in about 1 hour.  Biking 10 miles (16.1 km) or more in about 1 hour.  Lap swimming.  Roller-skating or in-line skating.  Cross-country skiing.  Vigorous competitive sports, such as football, basketball, and soccer.  Jumping rope.  Aerobic dancing. What are some everyday activities that can help me to get exercise?  Ferry work, such as: ? Pushing a Conservation officer, nature. ? Raking and bagging leaves.  Washing your car.  Pushing a stroller.  Shoveling snow.  Gardening.  Washing windows or floors. How can I be more active in my day-to-day activities?  Use stairs instead of an elevator.  Take a walk during your lunch break.  If you drive, park your car farther away from your work or school.  If you take public transportation, get off one stop early and walk the rest of the way.  Stand up or walk around during all of your indoor phone calls.  Get up, stretch, and walk around every 30 minutes throughout the day.  Enjoy exercise with a friend. Support to continue exercising will help you keep a regular routine of activity. What guidelines can I follow while exercising?  Before you start a new exercise program, talk with your health care provider.  Do not exercise so much that you hurt yourself, feel dizzy, or get very short of breath.  Wear comfortable clothes and wear shoes with good support.  Drink plenty of water while you exercise to prevent dehydration or heat stroke.  Work out until your breathing and your heartbeat get faster. Where to find more information  U.S. Department of Health and Human Services: [employer]at  Centers for Disease Control and Prevention (CDC): http://www.wolf.info/ Summary  Exercising regularly is important. It will improve your overall  fitness, flexibility, and endurance.  Regular exercise also will improve your overall health. It can help you control your weight, reduce stress, and improve your bone density.  Do not exercise so much that you hurt yourself, feel dizzy, or get very short of breath.  Before you start a new exercise program, talk with your health care provider. This information is not intended to replace advice given to you by your health care provider. Make sure you discuss any questions you have with your health care provider. Document Revised: 11/20/2017 Document Reviewed: 10/29/2017 Elsevier Patient Education  2020 [employer].

## 2020-08-07 ENCOUNTER — Encounter: Payer: Self-pay | Admitting: Adult Health

## 2020-08-07 LAB — LIPID PANEL
Cholesterol: 114 mg/dL (ref <200)
HDL: 39 mg/dL — ABNORMAL LOW (ref >=50)
LDL Cholesterol (Calc): 54 mg/dL (calc)
Non-HDL Cholesterol (Calc): 75 mg/dL (calc) (ref <130)
Total CHOL/HDL Ratio: 2.9 (calc) (ref <5.0)
Triglycerides: 125 mg/dL (ref <150)

## 2020-08-07 LAB — COMPLETE METABOLIC PANEL WITH GFR
AG Ratio: 1.4 (calc) (ref 1.0–2.5)
ALT: 27 U/L (ref 6–29)
AST: 26 U/L (ref 10–35)
Albumin: 4.2 g/dL (ref 3.6–5.1)
Alkaline phosphatase (APISO): 99 U/L (ref 37–153)
BUN/Creatinine Ratio: 15 (calc) (ref 6–22)
BUN: 21 mg/dL (ref 7–25)
CO2: 31 mmol/L (ref 20–32)
Calcium: 9.4 mg/dL (ref 8.6–10.4)
Chloride: 100 mmol/L (ref 98–110)
Creat: 1.44 mg/dL — ABNORMAL HIGH (ref 0.60–0.93)
GFR, Est African American: 41 mL/min/{1.73_m2} — ABNORMAL LOW (ref >=60)
GFR, Est Non African American: 36 mL/min/{1.73_m2} — ABNORMAL LOW (ref >=60)
Globulin: 3 g/dL (calc) (ref 1.9–3.7)
Glucose, Bld: 136 mg/dL — ABNORMAL HIGH (ref 65–99)
Potassium: 4.2 mmol/L (ref 3.5–5.3)
Sodium: 141 mmol/L (ref 135–146)
Total Bilirubin: 0.8 mg/dL (ref 0.2–1.2)
Total Protein: 7.2 g/dL (ref 6.1–8.1)

## 2020-08-07 LAB — URINALYSIS, ROUTINE W REFLEX MICROSCOPIC
Bilirubin Urine: NEGATIVE
Glucose, UA: NEGATIVE
Hgb urine dipstick: NEGATIVE
Hyaline Cast: NONE SEEN /LPF
Ketones, ur: NEGATIVE
Nitrite: NEGATIVE
Protein, ur: NEGATIVE
RBC / HPF: NONE SEEN /HPF (ref 0–2)
Specific Gravity, Urine: 1.009 (ref 1.001–1.03)
Squamous Epithelial / HPF: NONE SEEN /HPF (ref <=5)
pH: 5.5 (ref 5.0–8.0)

## 2020-08-07 LAB — IRON: Iron: 60 ug/dL (ref 45–160)

## 2020-08-07 LAB — CBC WITH DIFFERENTIAL/PLATELET
Absolute Monocytes: 599 cells/uL (ref 200–950)
Basophils Absolute: 76 cells/uL (ref 0–200)
Basophils Relative: 0.8 %
Eosinophils Absolute: 209 cells/uL (ref 15–500)
Eosinophils Relative: 2.2 %
HCT: 35.3 % (ref 35.0–45.0)
Hemoglobin: 11.2 g/dL — ABNORMAL LOW (ref 11.7–15.5)
Lymphs Abs: 2746 cells/uL (ref 850–3900)
MCH: 28.1 pg (ref 27.0–33.0)
MCHC: 31.7 g/dL — ABNORMAL LOW (ref 32.0–36.0)
MCV: 88.7 fL (ref 80.0–100.0)
MPV: 12.4 fL (ref 7.5–12.5)
Monocytes Relative: 6.3 %
Neutro Abs: 5871 cells/uL (ref 1500–7800)
Neutrophils Relative %: 61.8 %
Platelets: 315 10*3/uL (ref 140–400)
RBC: 3.98 10*6/uL (ref 3.80–5.10)
RDW: 14.3 % (ref 11.0–15.0)
Total Lymphocyte: 28.9 %
WBC: 9.5 10*3/uL (ref 3.8–10.8)

## 2020-08-07 LAB — MICROALBUMIN / CREATININE URINE RATIO
Creatinine, Urine: 47 mg/dL (ref 20–275)
Microalb Creat Ratio: 6 mcg/mg creat (ref <30)
Microalb, Ur: 0.3 mg/dL

## 2020-08-07 LAB — HEMOGLOBIN A1C
Hgb A1c MFr Bld: 5.9 % of total Hgb — ABNORMAL HIGH (ref <5.7)
Mean Plasma Glucose: 123 (calc)
eAG (mmol/L): 6.8 (calc)

## 2020-08-07 LAB — MAGNESIUM: Magnesium: 2.2 mg/dL (ref 1.5–2.5)

## 2020-08-07 LAB — RETICULOCYTES
ABS Retic: 71640 cells/uL (ref 20000–8000)
Retic Ct Pct: 1.8 %

## 2020-08-07 LAB — VITAMIN D 25 HYDROXY (VIT D DEFICIENCY, FRACTURES): Vit D, 25-Hydroxy: 80 ng/mL (ref 30–100)

## 2020-08-07 LAB — TSH: TSH: 1.21 mIU/L (ref 0.40–4.50)

## 2020-08-07 LAB — URIC ACID: Uric Acid, Serum: 7.2 mg/dL — ABNORMAL HIGH (ref 2.5–7.0)

## 2020-08-07 LAB — FERRITIN: Ferritin: 179 ng/mL (ref 16–288)

## 2020-08-10 ENCOUNTER — Other Ambulatory Visit: Payer: Self-pay | Admitting: Internal Medicine

## 2020-08-14 ENCOUNTER — Other Ambulatory Visit: Payer: Self-pay | Admitting: *Deleted

## 2020-08-14 MED ORDER — ONETOUCH ULTRA 2 W/DEVICE KIT: PACK | 0 refills | Status: DC

## 2020-08-28 ENCOUNTER — Encounter (INDEPENDENT_AMBULATORY_CARE_PROVIDER_SITE_OTHER): Payer: Medicare Other | Admitting: Ophthalmology

## 2020-08-28 ENCOUNTER — Other Ambulatory Visit: Payer: Self-pay

## 2020-08-28 DIAGNOSIS — I10 Essential (primary) hypertension: Secondary | ICD-10-CM | POA: Diagnosis not present

## 2020-08-28 DIAGNOSIS — H35033 Hypertensive retinopathy, bilateral: Secondary | ICD-10-CM | POA: Diagnosis not present

## 2020-08-28 DIAGNOSIS — H43813 Vitreous degeneration, bilateral: Secondary | ICD-10-CM | POA: Diagnosis not present

## 2020-08-28 DIAGNOSIS — H34812 Central retinal vein occlusion, left eye, with macular edema: Secondary | ICD-10-CM | POA: Diagnosis not present

## 2020-09-28 NOTE — Progress Notes (Signed)
Assessment and Plan:  Jocelyn Camacho was seen today for skin ulcer.  Diagnoses and all orders for this visit:  Venous stasis ulcer of left ankle with fat layer exposed without varicose veins (HCC) Spontaneous blister and open wound with ? Fat layer exposed vs mild purulence, hx of chronic peripheral edema with recurrence due to immobility, morbid obesity, limited health literacy and irregular compliance with lifestyle  Emphasized edema management again - consistently wear make to order compression hose Wear compression socks daily - apply first thing in AM.  Always elevate legs above heart level while sitting Do ankle pumps or walking 5 min each hour Can continue betadine/sugar slurry, stop once discharge stops and no open wound Will initiate doxycycline due to possible wound infection in diabetic - high risk  Follow up 1-2 weeks for monitoring, sooner if worsening wound, redness, fever/chills or other concerns -     doxycycline (VIBRAMYCIN) 100 MG capsule; Take 1 capsule 2 x/day with food for 10 days.  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   Wear made to order compression socks daily. Return to the office if no change with symptoms. May increase to BID for 3-5 days, then resume 1 tab daily and PRN. Check labs next visit -     furosemide (LASIX) 80 MG tablet; Take 1 tablet 1-2 x /day as needed for Fluid Retention / Ankle Swelling  Morbid obesity with body mass index of 45.0-49.9 in adult [facility]) She has been very resistant to meds thus far She has not demonstrated any progress with repeated lifestyle counseling attempts Long conversation about excess weight, imobility, venous stasis  May benefit from ozempic for diabetes and weight loss - she is receptive to this today, information given to review at home, will plan to start at next visit in 1-2 weeks and adjust insulin dose down as  able  Further disposition pending results of labs. Discussed med's effects and SE's.   Over 30 minutes of exam, counseling, chart review, and critical decision making was performed.   Future Appointments  Date Time Provider [place]  10/08/2020  7:45 AM [name], MD [facility] None  10/11/2020  8:45 AM [name], NP [facility] None  11/08/2020  9:30 AM [name], MD [facility] None  03/04/2021 10:00 AM Garnet Sierras, NP [facility] None  08/08/2021  9:00 AM [name], NP [facility] None    ------------------------------------------------------------------------------------------------------------------   HPI BP 130/72    Pulse 82    Temp (!) 96.4 F (35.8 C)    Ht $R'5\' 2"'hO$  (1.575 m)    Wt 254 lb (115.2 kg)    SpO2 99%    BMI 46.46 kg/m   74 y.o.female with T2DM, morbid obesity, htn, venous stasis with hx of recurrent ulcers presents for evaluation of open wound to left ankle.   She is unsure when this started, but noted about 1 week ago open wound with clear liquid oozing. She reports has been wearing light weight compression, has been doing betadine/sugar slurry and feels is improving but husband who assists was concerned and wanted her to be evaluated.   BMI is Body mass index is 46.46 kg/m., she has not been working on diet and exercise, limited exercise, walks twice a week going to ball games  Wt Readings from Last 3 Encounters:  10/02/20 254 lb (115.2 kg)  08/06/20 250 lb 12.8 oz (113.8 kg)  04/19/20 251 lb (113.9 kg)  Her blood pressure has been controlled at home, today their BP is BP: 130/72  She does not workout. She denies chest pain, shortness of breath, dizziness. She is taking lasix 40 mg once daily for edema, has been advised can take second tab PRN but admits hasn't done this recently  Lab Results  Component Value Date   GFRAA 41 (L) 08/06/2020     Past Medical History:  Diagnosis Date   Allergic rhinitis    Allergy     Anxiety    with medical procedures   Hyperlipidemia    Hypertension    Obesity    OSA (obstructive sleep apnea)    wears CPAP   Type II or unspecified type diabetes mellitus with renal manifestations, not stated as uncontrolled(250.40)    Unspecified hypothyroidism    Vitamin D deficiency      Allergies  Allergen Reactions   Penicillins Swelling   Sulfa Antibiotics Itching and Rash    Current Outpatient Medications on File Prior to Visit  Medication Sig   allopurinol (ZYLOPRIM) 300 MG tablet Take 1/2 tablet Daily to Prevent Gout   aspirin EC 81 MG tablet Take 81 mg by mouth daily.   atenolol (TENORMIN) 100 MG tablet Take 1 tablet by mouth once daily for blood pressure   BESIVANCE 0.6 % SUSP Place 1 drop into the left eye 4 (four) times daily.    BLACK PEPPER-TURMERIC PO Take by mouth daily.   Blood Glucose Monitoring Suppl (ONE TOUCH ULTRA 2) w/Device KIT check blood sugar 3 times daily-DX-E11.21   Cholecalciferol (VITAMIN D3) 5000 units CAPS Take 10,000 Units by mouth daily.   colchicine 0.6 MG tablet Take 1 tablet Daily to Prevent Gout   hyoscyamine (LEVSIN, ANASPAZ) 0.125 MG tablet Take 1 tablet (0.125 mg total) by mouth every 6 (six) hours as needed.   insulin NPH-regular Human (NOVOLIN 70/30) (70-30) 100 UNIT/ML injection Inject into the skin. Patient injects 25 units in the AM and 10 units in the PM.    Lancets (ONETOUCH ULTRASOFT) lancets Use as instructed Dx. Code: E11.22   Lancets (ONETOUCH ULTRASOFT) lancets Check blood sugar 3 times daily-DX-E10.9   levothyroxine (SYNTHROID) 125 MCG tablet Take 1 tablet daily on an empty stomach with only water for 30 minutes & no Antacid meds, Calcium or Magnesium for 4 hours & avoid Biotin   losartan (COZAAR) 100 MG tablet TAKE 1 TABLET BY MOUTH DAILY FOR BLOOD PRESSURE AND DIABETIC KIDNEY PROTECTION   ONETOUCH ULTRA test strip CHECK BLOOD SUGAR 3 TIMES A DAY   potassium chloride (KLOR-CON) 10 MEQ tablet TAKE  1 TABLET BY MOUTH EVERY DAY   prednisoLONE acetate (PRED FORTE) 1 % ophthalmic suspension INSTILL 1 DROP INTO EACH EYE TWICE A DAY   rosuvastatin (CRESTOR) 5 MG tablet Take 1 tablet Daily for Cholesterol   No current facility-administered medications on file prior to visit.    ROS: all negative except above.   Physical Exam:  BP 130/72    Pulse 82    Temp (!) 96.4 F (35.8 C)    Ht 5\' 2"  (1.575 m)    Wt 254 lb (115.2 kg)    SpO2 99%    BMI 46.46 kg/m   General Appearance: Well nourished, in no apparent distress. Eyes: PERRLA, EOMs, conjunctiva no swelling or erythema Sinuses: No Frontal/maxillary tenderness ENT/Mouth: Ext aud canals clear, TMs without erythema, bulging. No erythema, swelling, or exudate on post pharynx.  Tonsils not swollen or erythematous. Hearing normal.  Neck: Supple,  thyroid normal.  Respiratory: Respiratory effort normal, BS equal bilaterally without rales, rhonchi, wheezing or stridor.  Cardio: RRR with no MRGs. Brisk peripheral pulses without edema.  Abdomen: Soft, + BS.  Non tender, no guarding, rebound, hernias, masses. Lymphatics: Non tender without lymphadenopathy.  Musculoskeletal: Full ROM, symmetrical strength throughout lower extremities, slow steady gait.  Skin: intact without concerning lesions/rashes excepting left lower leg with open wound - irregular approx 5 cm x 3 cm, serous discharge, some ? Purulence vs fatty layer to wound (see attached photo), pink but not significantly erythematous or warm Neuro: Cranial nerves intact. Normal muscle tone, no cerebellar symptoms. Sensation intact.  Psych: Awake and oriented X 3, normal affect, Insight and Judgment fair        Izora Ribas, NP 1:15 PM [facility]

## 2020-10-02 ENCOUNTER — Encounter: Payer: Self-pay | Admitting: Adult Health

## 2020-10-02 ENCOUNTER — Other Ambulatory Visit: Payer: Self-pay

## 2020-10-02 ENCOUNTER — Ambulatory Visit (INDEPENDENT_AMBULATORY_CARE_PROVIDER_SITE_OTHER): Payer: Medicare Other | Admitting: Adult Health

## 2020-10-02 VITALS — BP 130/72 | HR 82 | Temp 96.4°F | Ht 62.0 in | Wt 254.0 lb

## 2020-10-02 DIAGNOSIS — Z6841 Body Mass Index (BMI) 40.0 and over, adult: Secondary | ICD-10-CM | POA: Diagnosis not present

## 2020-10-02 DIAGNOSIS — L97322 Non-pressure chronic ulcer of left ankle with fat layer exposed: Secondary | ICD-10-CM | POA: Diagnosis not present

## 2020-10-02 DIAGNOSIS — I872 Venous insufficiency (chronic) (peripheral): Secondary | ICD-10-CM

## 2020-10-02 MED ORDER — FUROSEMIDE 80 MG PO TABS: ORAL_TABLET | ORAL | 3 refills | Status: DC

## 2020-10-02 MED ORDER — DOXYCYCLINE HYCLATE 100 MG PO CAPS: ORAL_CAPSULE | ORAL | 0 refills | Status: DC

## 2020-10-02 NOTE — Patient Instructions (Addendum)
Continue with daily sugar/betadine until wound is closed  Doxycycline  COMPRESSION - do stronger compression daily, indefinitely   Elevate legs above heart level as much as possible   But get up and walk or do ankle pumps regularly at least once an hours     Venous Ulcer A venous ulcer is a shallow sore on your lower leg that is caused by poor circulation in your veins. This condition used to be called stasis ulcer. Venous ulcer is the most common type of lower leg ulcer. You may have venous ulcers on one leg or on both legs. The area where this condition most commonly develops is around the ankles. A venous ulcer may last for a long time (chronic ulcer) or it may return repeatedly (recurrent ulcer). What are the causes? A venous ulcer may be caused by any condition that causes poor blood flow in your legs. Veins have valves that help return blood to the heart. If these valves do not work properly:  Blood can flow backward and pool in the lower legs.  Blood can then leak out of your veins, which can irritate your skin.  Irritation can cause a break in the skin, which becomes a venous ulcer. What increases the risk? You are more likely to develop this condition if you:  Are 12 years of age or older.  Are female.  Are overweight.  Are not active.  Have had a leg ulcer in the past.  Have varicose veins.  Have clots in your lower leg veins (deep vein thrombosis).  Have inflammation of your leg veins (phlebitis).  Have recently had a pregnancy.  Use products that contain nicotine or tobacco. What are the signs or symptoms? The main symptom of this condition is an open sore near your ankle. Other symptoms may include:  Swelling.  Thickening of the skin.  Fluid leaking from the ulcer.  Bleeding.  Itching.  Pain and swelling that gets worse when you stand up and feels better when you raise your leg.  Blotchy skin.  Darkening of the skin. How is this  diagnosed? Your health care provider may suspect a venous ulcer based on your medical history and your risk factors. He or she may:  Do a physical exam.  Do other tests, such as: ? Measuring blood pressure in your arms and legs. ? Using sound waves (ultrasound) to measure blood flow in your leg veins. How is this treated? This condition may be treated by:  Keeping your leg raised (elevated).  Wearing a type of bandage or stocking to compress the veins of your leg (compression therapy).  Taking medicines to improve blood flow.  Taking antibiotic medicines to treat infection.  Cleaning your ulcer and removing any dead tissue from the wound (debridement).  Placing various types of medicated bandages (dressings) or medicated wraps on your ulcer.  Surgery to close the wound using a piece of skin taken from another area of your body (graft). This is only done for wounds that are deep or hard to heal. You may need to try several different types of treatment to get your venous ulcer to heal. Healing may take a long time. Follow these instructions at home: Medicines  Take or apply over-the-counter and prescription medicines only as told by your health care provider.  If you were prescribed an antibiotic medicine, take it as told by your health care provider. Do not stop using the antibiotic even if you start to feel better.  Ask your health  care provider if you should take aspirin before long trips. Wound care  Follow instructions from your health care provider about how to take care of your wound. Make sure you: ? Wash your hands with soap and water before and after you change your bandage (dressing). If soap and water are not available, use hand sanitizer. ? Change your dressing as told by your health care provider. ? If you had a skin graft, leave stitches (sutures) in place. These may need to stay in place for 2 weeks or longer. ? Ask when you should remove your dressing. If your  dressing is dry and sticks to your leg when you try to remove it, moisten or wet the dressing with saline solution or water so that the dressing can be removed without harming your skin or wound tissue.  When you are able to remove your dressing, check your wound every day for signs of infection. Have a caregiver do this for you if you are not able to do it yourself. Check for: ? More redness, swelling, or pain. ? More fluid or blood. ? Warmth. ? Pus or a bad smell. Activity  Avoid sitting for a long time without moving. Get up to take short walks every 1-2 hours. This is important to improve blood flow in your legs. Ask for help if you feel weak or unsteady.  Ask your health care provider what level of activity is safe for you.  Rest with your legs raised (elevated) during the day. If possible, elevate your legs above the level of your heart for 30 minutes, 3-4 times a day, or as told by your health care provider.  Do not sit with your legs crossed. General instructions   Wear elastic stockings, compression stockings, or support hose as told by your health care provider.  Raise the foot of your bed as told by your health care provider.  Do not use any products that contain nicotine or tobacco, such as cigarettes, e-cigarettes, and chewing tobacco. If you need help quitting, ask your health care provider.  Keep all follow-up visits as told by your health care provider. This is important. Contact a health care provider if:  Your ulcer is getting larger or is not healing.  Your pain gets worse. Get help right away if you have:  More redness, swelling, or pain around your ulcer.  More fluid or blood coming from your ulcer.  Warmth in the area around your ulcer.  Pus or a bad smell coming from your ulcer.  A fever. Summary  A venous ulcer is a shallow sore on your lower leg that is caused by poor circulation in your veins.  Follow instructions from your health care provider  about how to take care of your wound.  Check your wound every day for signs of infection.  Take over-the-counter and prescription medicines only as told by your health care provider.  Keep all follow-up visits as told by your health care provider. This is important. This information is not intended to replace advice given to you by your health care provider. Make sure you discuss any questions you have with your health care provider. Document Revised: 08/05/2018 Document Reviewed: 08/05/2018 Elsevier Patient Education  [place].      Doxycycline tablets or capsules What is this medicine? DOXYCYCLINE (dox i SYE kleen) is a tetracycline antibiotic. It kills certain bacteria or stops their growth. It is used to treat many kinds of infections, like dental, skin, respiratory, and urinary  tract infections. It also treats acne, Lyme disease, malaria, and certain sexually transmitted infections. This medicine may be used for other purposes; ask your health care provider or pharmacist if you have questions. COMMON BRAND NAME(S): Acticlate, Adoxa, Adoxa CK, Adoxa Pak, Adoxa TT, Alodox, Avidoxy, Doxal, LYMEPAK, Mondoxyne NL, Monodox, Morgidox 1x, Morgidox 1x Kit, Morgidox 2x, Morgidox 2x Kit, NutriDox, Ocudox, [place], Ada, Vibra-Tabs, Vibramycin What should I tell my health care provider before I take this medicine? They need to know if you have any of these conditions:  liver disease  long exposure to sunlight like working outdoors  stomach problems like colitis  an unusual or allergic reaction to doxycycline, tetracycline antibiotics, other medicines, foods, dyes, or preservatives  pregnant or trying to get pregnant  breast-feeding How should I use this medicine? Take this medicine by mouth with a full glass of water. Follow the directions on the prescription label. It is best to take this medicine without food, but if it upsets your stomach take it with food. Take your  medicine at regular intervals. Do not take your medicine more often than directed. Take all of your medicine as directed even if you think you are better. Do not skip doses or stop your medicine early. Talk to your pediatrician regarding the use of this medicine in children. While this drug may be prescribed for selected conditions, precautions do apply. Overdosage: If you think you have taken too much of this medicine contact a poison control center or emergency room at once. NOTE: This medicine is only for you. Do not share this medicine with others. What if I miss a dose? If you miss a dose, take it as soon as you can. If it is almost time for your next dose, take only that dose. Do not take double or extra doses. What may interact with this medicine?  antacids  barbiturates  birth control pills  bismuth subsalicylate  carbamazepine  methoxyflurane  other antibiotics  phenytoin  vitamins that contain iron  warfarin This list may not describe all possible interactions. Give your health care provider a list of all the medicines, herbs, non-prescription drugs, or dietary supplements you use. Also tell them if you smoke, drink alcohol, or use illegal drugs. Some items may interact with your medicine. What should I watch for while using this medicine? Tell your doctor or health care professional if your symptoms do not improve. Do not treat diarrhea with over the counter products. Contact your doctor if you have diarrhea that lasts more than 2 days or if it is severe and watery. Do not take this medicine just before going to bed. It may not dissolve properly when you lay down and can cause pain in your throat. Drink plenty of fluids while taking this medicine to also help reduce irritation in your throat. This medicine can make you more sensitive to the sun. Keep out of the sun. If you cannot avoid being in the sun, wear protective clothing and use sunscreen. Do not use sun lamps or  tanning beds/booths. Birth control pills may not work properly while you are taking this medicine. Talk to your doctor about using an extra method of birth control. If you are being treated for a sexually transmitted infection, avoid sexual contact until you have finished your treatment. Your sexual partner may also need treatment. Avoid antacids, aluminum, calcium, magnesium, and iron products for 4 hours before and 2 hours after taking a dose of this medicine. If you are using this  medicine to prevent malaria, you should still protect yourself from contact with mosquitos. Stay in screened-in areas, use mosquito nets, keep your body covered, and use an insect repellent. What side effects may I notice from receiving this medicine? Side effects that you should report to your doctor or health care professional as soon as possible:  allergic reactions like skin rash, itching or hives, swelling of the face, lips, or tongue  difficulty breathing  fever  itching in the rectal or genital area  pain on swallowing  rash, fever, and swollen lymph nodes  redness, blistering, peeling or loosening of the skin, including inside the mouth  severe stomach pain or cramps  unusual bleeding or bruising  unusually weak or tired  yellowing of the eyes or skin Side effects that usually do not require medical attention (report to your doctor or health care professional if they continue or are bothersome):  diarrhea  loss of appetite  nausea, vomiting This list may not describe all possible side effects. Call your doctor for medical advice about side effects. You may report side effects to FDA at 1-800-FDA-1088. Where should I keep my medicine? Keep out of the reach of children. Store at room temperature, below 30 degrees C (86 degrees F). Protect from light. Keep container tightly closed. Throw away any unused medicine after the expiration date. Taking this medicine after the expiration date can make  you seriously ill. NOTE: This sheet is a summary. It may not cover all possible information. If you have questions about this medicine, talk to your doctor, pharmacist, or health care provider.  2020 Elsevier/Gold Standard (2019-03-10 13:44:53)

## 2020-10-08 ENCOUNTER — Encounter (INDEPENDENT_AMBULATORY_CARE_PROVIDER_SITE_OTHER): Payer: Medicare Other | Admitting: Ophthalmology

## 2020-10-08 ENCOUNTER — Other Ambulatory Visit: Payer: Self-pay

## 2020-10-08 DIAGNOSIS — H43813 Vitreous degeneration, bilateral: Secondary | ICD-10-CM | POA: Diagnosis not present

## 2020-10-08 DIAGNOSIS — H34812 Central retinal vein occlusion, left eye, with macular edema: Secondary | ICD-10-CM

## 2020-10-08 DIAGNOSIS — I10 Essential (primary) hypertension: Secondary | ICD-10-CM

## 2020-10-08 DIAGNOSIS — H35033 Hypertensive retinopathy, bilateral: Secondary | ICD-10-CM

## 2020-10-09 ENCOUNTER — Ambulatory Visit: Payer: Medicare Other | Attending: Internal Medicine

## 2020-10-09 DIAGNOSIS — Z23 Encounter for immunization: Secondary | ICD-10-CM

## 2020-10-09 NOTE — Progress Notes (Signed)
   Cov[facility] Vaccination [facility]  Name:  Jocelyn Camacho    MRN: [id number] DOB: 12/10/1946  10/09/2020  [name] was observed post Covid-19 immunization for 15 minutes without incident. She was provided with Vaccine Information Sheet and instruction to access the V-Safe system.   [name] was instructed to call 911 with any severe reactions post vaccine: Marland Kitchen Difficulty breathing  . Swelling of face and throat  . A fast heartbeat  . A bad rash all over body  . Dizziness and weakness

## 2020-10-11 ENCOUNTER — Ambulatory Visit: Payer: Medicare Other | Admitting: Adult Health

## 2020-10-15 ENCOUNTER — Other Ambulatory Visit: Payer: Self-pay | Admitting: Internal Medicine

## 2020-10-15 DIAGNOSIS — Z0000 Encounter for general adult medical examination without abnormal findings: Secondary | ICD-10-CM

## 2020-10-15 DIAGNOSIS — M1A9XX0 Chronic gout, unspecified, without tophus (tophi): Secondary | ICD-10-CM

## 2020-10-15 MED ORDER — COLCHICINE 0.6 MG PO TABS: ORAL_TABLET | ORAL | 0 refills | Status: DC

## 2020-10-22 NOTE — Progress Notes (Signed)
Assessment and Plan:  Jocelyn Camacho was seen today for skin ulcer.  Diagnoses and all orders for this visit:  Venous stasis ulcer of left ankle with fat layer exposed without varicose veins (HCC) Resolved hx of chronic peripheral edema with recurrence due to immobility, morbid obesity, limited health literacy and irregular compliance with lifestyle  Emphasized edema management again - consistently wear make to order compression hose Wear compression socks daily - apply first thing in AM.  Always elevate legs above heart level while sitting Do ankle pumps or walking 5 min each hour Follow up if any recurrence with concern for infection  Edema of both lower extremities due to peripheral venous insufficiency - elevate legs TID, increase activity, increase water, decrease sodium intake.   Wear made to order compression socks daily. Return to the office if worsening edema -     furosemide (LASIX) 80 MG tablet; Take 1 tablet 1-2 x /day as needed for Fluid Retention / Ankle Swelling  CKD IIIb (HCC) Has follow up with Dr. Posey Pronto next week with labs; discussed and defer today Continue current medications and push fluid intake   Morbid obesity with body mass index of 45.0-49.9 in adult [facility]) She has been very resistant to meds thus far She has not demonstrated any progress with repeated lifestyle counseling attempts Long conversation about excess weight, imobility, venous stasis  May benefit from ozempic for diabetes and weight loss - she is receptive to this today, discussed and initated 0.25 mg weekly, increase to 0.5 mg weekly in 4 weeks if tolerating well, follow up if any concern with SE Given coupon; follow up as scheduled; can schedule NV for initial administration  Will need slow insulin taper; monitor glucose BID prior to meals, if <100, reduce  -     Semaglutide,0.25 or 0.5MG/DOS, (OZEMPIC, 0.25 OR 0.5 MG/DOSE,) 2 MG/1.5ML SOPN; Inject 0.25 mg into skin once a week for weight and diabetes. If  tolerating well in 1 month, increase to 0.5 mg once weekly. -     insulin NPH-regular Human (70-30) 100 UNIT/ML injection; Patient injects 25 units in the AM and 10 units in the PM. Reduce each insulin dose by 2 units if glucose persistently <100   Further disposition pending results of labs. Discussed med's effects and SE's.   Over 30 minutes of exam, counseling, chart review, and critical decision making was performed.   Future Appointments  Date Time Provider [place]  11/08/2020  9:30 AM [name], MD [facility] None  11/19/2020  7:45 AM [name], MD [facility] None  11/21/2020  8:40 AM [facility] MM 2 [facility]  03/04/2021 10:30 AM Garnet Sierras, NP [facility] None  08/08/2021  9:00 AM [name], NP [facility] None    ------------------------------------------------------------------------------------------------------------------   HPI BP 128/78   Pulse 71   Temp 97.7 F (36.5 C)   Wt 249 lb 12.8 oz (113.3 kg)   SpO2 97%   BMI 45.69 kg/m   74 y.o.female with T2DM, morbid obesity, htn, venous stasis with hx of recurrent ulcers presents for follow up on L ankle stasis ulcer.   No injury, patient noted serous oozing and open wound around 09/25/2020 She reports has been wearing light weight compression, has been doing betadine/sugar slurry. Some concern with possible wound infection and was given course of doxycycline due to risks/comorbidities but emphasized need for consistent compression therapy, regular exercise, extremity elevation and WEIGHT LOSS.  She has switched to heavier compression, elevaing legs and doing exercises, trying  to get up and walk more. She reports ulcer resolved. Does have new blister, keeping clear dressing on it.   BMI is Body mass index is 45.69 kg/m., she has  been working on diet and exercise, Getting up and walking more, down 5 lb since last visit; she is interested in trying ozempic.  Wt Readings from Last 3  Encounters:  10/24/20 249 lb 12.8 oz (113.3 kg)  10/02/20 254 lb (115.2 kg)  08/06/20 250 lb 12.8 oz (113.8 kg)   Her blood pressure has been controlled at home, today their BP is BP: 128/78  She does not workout. She denies chest pain, shortness of breath, dizziness. She is taking lasix 80 mg once daily for edema, rarely takes BID if increased edema   Lab Results  Component Value Date   GFRAA 41 (L) 08/06/2020   Off of metformin and on novolin 70/30 25 units AM, 10 units PM due to kidney functions, well controlled; she checks glucose BID and denies hypoglycemia;  Lab Results  Component Value Date   HGBA1C 5.9 (H) 08/06/2020     Past Medical History:  Diagnosis Date  . Allergic rhinitis   . Allergy   . Anxiety    with medical procedures  . Hyperlipidemia   . Hypertension   . Obesity   . OSA (obstructive sleep apnea)    wears CPAP  . Type II or unspecified type diabetes mellitus with renal manifestations, not stated as uncontrolled(250.40)   . Unspecified hypothyroidism   . Vitamin D deficiency      Allergies  Allergen Reactions  . Penicillins Swelling  . Sulfa Antibiotics Itching and Rash    Current Outpatient Medications on File Prior to Visit  Medication Sig  . allopurinol (ZYLOPRIM) 300 MG tablet Take 1/2 tablet Daily to Prevent Gout  . aspirin EC 81 MG tablet Take 81 mg by mouth daily.  Marland Kitchen atenolol (TENORMIN) 100 MG tablet Take 1 tablet by mouth once daily for blood pressure  . BESIVANCE 0.6 % SUSP Place 1 drop into the left eye 4 (four) times daily.   Marland Kitchen BLACK PEPPER-TURMERIC PO Take by mouth daily.  . Blood Glucose Monitoring Suppl (ONE TOUCH ULTRA 2) w/Device KIT check blood sugar 3 times daily-DX-E11.21  . Cholecalciferol (VITAMIN D3) 5000 units CAPS Take 10,000 Units by mouth daily.  . colchicine 0.6 MG tablet Take      1 tablet      Daily      to Prevent Gout  . furosemide (LASIX) 80 MG tablet Take 1 tablet 1-2 x /day as needed for Fluid Retention / Ankle  Swelling  . hyoscyamine (LEVSIN, ANASPAZ) 0.125 MG tablet Take 1 tablet (0.125 mg total) by mouth every 6 (six) hours as needed.  . Lancets (ONETOUCH ULTRASOFT) lancets Use as instructed Dx. Code: E11.22  . Lancets (ONETOUCH ULTRASOFT) lancets Check blood sugar 3 times daily-DX-E10.9  . levothyroxine (SYNTHROID) 125 MCG tablet Take 1 tablet daily on an empty stomach with only water for 30 minutes & no Antacid meds, Calcium or Magnesium for 4 hours & avoid Biotin  . losartan (COZAAR) 100 MG tablet TAKE 1 TABLET BY MOUTH DAILY FOR BLOOD PRESSURE AND DIABETIC KIDNEY PROTECTION  . ONETOUCH ULTRA test strip CHECK BLOOD SUGAR 3 TIMES A DAY  . potassium chloride (KLOR-CON) 10 MEQ tablet TAKE 1 TABLET BY MOUTH EVERY DAY  . prednisoLONE acetate (PRED FORTE) 1 % ophthalmic suspension INSTILL 1 DROP INTO EACH EYE TWICE A DAY  . rosuvastatin (  CRESTOR) 5 MG tablet Take 1 tablet Daily for Cholesterol   No current facility-administered medications on file prior to visit.    ROS: all negative except above.   Physical Exam:  BP 128/78   Pulse 71   Temp 97.7 F (36.5 C)   Wt 249 lb 12.8 oz (113.3 kg)   SpO2 97%   BMI 45.69 kg/m   General Appearance: Well nourished, in no apparent distress. Eyes: PERRLA, EOMs, conjunctiva no swelling or erythema Sinuses: No Frontal/maxillary tenderness ENT/Mouth: Ext aud canals clear, TMs without erythema, bulging. No erythema, swelling, or exudate on post pharynx.  Tonsils not swollen or erythematous. Hearing normal.  Neck: Supple, thyroid normal.  Respiratory: Respiratory effort normal, BS equal bilaterally without rales, rhonchi, wheezing or stridor.  Cardio: RRR with no MRGs. Brisk peripheral pulses without edema.  Abdomen: Soft, + BS.  Non tender, no guarding, rebound, hernias, masses. Lymphatics: Non tender without lymphadenopathy.  Musculoskeletal: Full ROM, symmetrical strength throughout lower extremities, slow steady gait.  Skin: intact without  concerning lesions/rashes excepting left lower leg with well healed would at previous site, has blister with serous fluid approx 2 cm x 1.5 cm below-not erythematous or warm Neuro: Cranial nerves intact. Normal muscle tone, no cerebellar symptoms. Sensation intact.  Psych: Awake and oriented X 3, normal affect, Insight and Judgment fair    Izora Ribas, NP 11:26 AM [facility]

## 2020-10-24 ENCOUNTER — Ambulatory Visit (INDEPENDENT_AMBULATORY_CARE_PROVIDER_SITE_OTHER): Payer: Medicare Other | Admitting: Adult Health

## 2020-10-24 ENCOUNTER — Encounter: Payer: Self-pay | Admitting: Adult Health

## 2020-10-24 ENCOUNTER — Other Ambulatory Visit: Payer: Self-pay

## 2020-10-24 VITALS — BP 128/78 | HR 71 | Temp 97.7°F | Wt 249.8 lb

## 2020-10-24 DIAGNOSIS — L97929 Non-pressure chronic ulcer of unspecified part of left lower leg with unspecified severity: Secondary | ICD-10-CM

## 2020-10-24 DIAGNOSIS — Z794 Long term (current) use of insulin: Secondary | ICD-10-CM

## 2020-10-24 DIAGNOSIS — E1122 Type 2 diabetes mellitus with diabetic chronic kidney disease: Secondary | ICD-10-CM | POA: Diagnosis not present

## 2020-10-24 DIAGNOSIS — N1832 Chronic kidney disease, stage 3b: Secondary | ICD-10-CM

## 2020-10-24 DIAGNOSIS — I83029 Varicose veins of left lower extremity with ulcer of unspecified site: Secondary | ICD-10-CM

## 2020-10-24 DIAGNOSIS — Z6841 Body Mass Index (BMI) 40.0 and over, adult: Secondary | ICD-10-CM

## 2020-10-24 DIAGNOSIS — I878 Other specified disorders of veins: Secondary | ICD-10-CM

## 2020-10-24 MED ORDER — INSULIN NPH ISOPHANE & REGULAR (70-30) 100 UNIT/ML ~~LOC~~ SUSP: SUBCUTANEOUS | Status: DC

## 2020-10-24 MED ORDER — OZEMPIC (0.25 OR 0.5 MG/DOSE) 2 MG/1.5ML ~~LOC~~ SOPN: PEN_INJECTOR | SUBCUTANEOUS | 0 refills | Status: DC

## 2020-10-24 NOTE — Patient Instructions (Signed)
Start ozempic 0,25 mg once weekly injections for 1 month Then if tolerating well, incrase to 0.5 mg weekly    Monitor sugars closely - if getting <100, then reduce the prior dose by 2-3 units If fasting <100, reduce evening dose to 7-8 units If prior to dinner <100, then reduce morning insulin by 2-3 units        Semaglutide injection solution What is this medicine? SEMAGLUTIDE (Sem a GLOO tide) is used to improve blood sugar control in adults with type 2 diabetes. This medicine may be used with other diabetes medicines. This drug may also reduce the risk of heart attack or stroke if you have type 2 diabetes and risk factors for heart disease. This medicine may be used for other purposes; ask your health care provider or pharmacist if you have questions. COMMON BRAND NAME(S): OZEMPIC What should I tell my health care provider before I take this medicine? They need to know if you have any of these conditions:  endocrine tumors (MEN 2) or if someone in your family had these tumors  eye disease, vision problems  history of pancreatitis  kidney disease  stomach problems  thyroid cancer or if someone in your family had thyroid cancer  an unusual or allergic reaction to semaglutide, other medicines, foods, dyes, or preservatives  pregnant or trying to get pregnant  breast-feeding How should I use this medicine? This medicine is for injection under the skin of your upper leg (thigh), stomach area, or upper arm. It is given once every week (every 7 days). You will be taught how to prepare and give this medicine. Use exactly as directed. Take your medicine at regular intervals. Do not take it more often than directed. If you use this medicine with insulin, you should inject this medicine and the insulin separately. Do not mix them together. Do not give the injections right next to each other. Change (rotate) injection sites with each injection. It is important that you put your  used needles and syringes in a special sharps container. Do not put them in a trash can. If you do not have a sharps container, call your pharmacist or healthcare provider to get one. A special MedGuide will be given to you by the pharmacist with each prescription and refill. Be sure to read this information carefully each time. This drug comes with INSTRUCTIONS FOR USE. Ask your pharmacist for directions on how to use this drug. Read the information carefully. Talk to your pharmacist or health care provider if you have questions. Talk to your pediatrician regarding the use of this medicine in children. Special care may be needed. Overdosage: If you think you have taken too much of this medicine contact a poison control center or emergency room at once. NOTE: This medicine is only for you. Do not share this medicine with others. What if I miss a dose? If you miss a dose, take it as soon as you can within 5 days after the missed dose. Then take your next dose at your regular weekly time. If it has been longer than 5 days after the missed dose, do not take the missed dose. Take the next dose at your regular time. Do not take double or extra doses. If you have questions about a missed dose, contact your health care provider for advice. What may interact with this medicine?  other medicines for diabetes Many medications may cause changes in blood sugar, these include:  alcohol containing beverages  antiviral  medicines for HIV or AIDS  aspirin and aspirin-like drugs  certain medicines for blood pressure, heart disease, irregular heart beat  chromium  diuretics  female hormones, such as estrogens or progestins, birth control pills  fenofibrate  gemfibrozil  isoniazid  lanreotide  female hormones or anabolic steroids  MAOIs like Carbex, Eldepryl, Marplan, Nardil, and Parnate  medicines for weight loss  medicines for allergies, asthma, cold, or cough  medicines for depression,  anxiety, or psychotic disturbances  niacin  nicotine  NSAIDs, medicines for pain and inflammation, like ibuprofen or naproxen  octreotide  pasireotide  pentamidine  phenytoin  probenecid  quinolone antibiotics such as ciprofloxacin, levofloxacin, ofloxacin  some herbal dietary supplements  steroid medicines such as prednisone or cortisone  sulfamethoxazole; trimethoprim  thyroid hormones Some medications can hide the warning symptoms of low blood sugar (hypoglycemia). You may need to monitor your blood sugar more closely if you are taking one of these medications. These include:  beta-blockers, often used for high blood pressure or heart problems (examples include atenolol, metoprolol, propranolol)  clonidine  guanethidine  reserpine This list may not describe all possible interactions. Give your health care provider a list of all the medicines, herbs, non-prescription drugs, or dietary supplements you use. Also tell them if you smoke, drink alcohol, or use illegal drugs. Some items may interact with your medicine. What should I watch for while using this medicine? Visit your doctor or health care professional for regular checks on your progress. Drink plenty of fluids while taking this medicine. Check with your doctor or health care professional if you get an attack of severe diarrhea, nausea, and vomiting. The loss of too much body fluid can make it dangerous for you to take this medicine. A test called the HbA1C (A1C) will be monitored. This is a simple blood test. It measures your blood sugar control over the last 2 to 3 months. You will receive this test every 3 to 6 months. Learn how to check your blood sugar. Learn the symptoms of low and high blood sugar and how to manage them. Always carry a quick-source of sugar with you in case you have symptoms of low blood sugar. Examples include hard sugar candy or glucose tablets. Make sure others know that you can choke if  you eat or drink when you develop serious symptoms of low blood sugar, such as seizures or unconsciousness. They must get medical help at once. Tell your doctor or health care professional if you have high blood sugar. You might need to change the dose of your medicine. If you are sick or exercising more than usual, you might need to change the dose of your medicine. Do not skip meals. Ask your doctor or health care professional if you should avoid alcohol. Many nonprescription cough and cold products contain sugar or alcohol. These can affect blood sugar. Pens should never be shared. Even if the needle is changed, sharing may result in passing of viruses like hepatitis or HIV. Wear a medical ID bracelet or chain, and carry a card that describes your disease and details of your medicine and dosage times. Do not become pregnant while taking this medicine. Women should inform their doctor if they wish to become pregnant or think they might be pregnant. There is a potential for serious side effects to an unborn child. Talk to your health care professional or pharmacist for more information. What side effects may I notice from receiving this medicine? Side effects that you should report  to your doctor or health care professional as soon as possible:  allergic reactions like skin rash, itching or hives, swelling of the face, lips, or tongue  breathing problems  changes in vision  diarrhea that continues or is severe  lump or swelling on the neck  severe nausea  signs and symptoms of infection like fever or chills; cough; sore throat; pain or trouble passing urine  signs and symptoms of low blood sugar such as feeling anxious, confusion, dizziness, increased hunger, unusually weak or tired, sweating, shakiness, cold, irritable, headache, blurred vision, fast heartbeat, loss of consciousness  signs and symptoms of kidney injury like trouble passing urine or change in the amount of urine  trouble  swallowing  unusual stomach upset or pain  vomiting Side effects that usually do not require medical attention (report to your doctor or health care professional if they continue or are bothersome):  constipation  diarrhea  nausea  pain, redness, or irritation at site where injected  stomach upset This list may not describe all possible side effects. Call your doctor for medical advice about side effects. You may report side effects to FDA at 1-800-FDA-1088. Where should I keep my medicine? Keep out of the reach of children. Store unopened pens in a refrigerator between 2 and 8 degrees C (36 and 46 degrees F). Do not freeze. Protect from light and heat. After you first use the pen, it can be stored for 56 days at room temperature between 15 and 30 degrees C (59 and 86 degrees F) or in a refrigerator. Throw away your used pen after 56 days or after the expiration date, whichever comes first. Do not store your pen with the needle attached. If the needle is left on, medicine may leak from the pen. NOTE: This sheet is a summary. It may not cover all possible information. If you have questions about this medicine, talk to your doctor, pharmacist, or health care provider.  2020 [employer]/Gold Standard (2019-08-23 09:41:51)

## 2020-11-07 ENCOUNTER — Encounter: Payer: Self-pay | Admitting: Internal Medicine

## 2020-11-07 NOTE — Patient Instructions (Signed)

## 2020-11-07 NOTE — Progress Notes (Signed)
History of Present Illness:       This very nice 74 y.o.  MBF presents for 3 month follow up with HTN, HLD,  Morbid Obesity  (BMI  45.69 ! ),  Ins Req T2_IDDM and Vitamin D Deficiency.  Patient's Gout is controlled on Allopurinol. Patient has OSA & infreq uses her CPAP.          Patient was just seen by her Nephrologist - Dr Elmarie Shiley and had labs showing       BUN /Craeat  27 /1.56 ,      GFR 37,       mag 2.3,       Hgb 10.5 gm%, Vit D 78.2,   PTH  68      Patient is treated for HTN  (1984) & BP has been controlled at home. Today's BP: 122/80. Patient has had no complaints of any cardiac type chest pain, palpitations, dyspnea / orthopnea / PND, dizziness, claudication, or dependent edema.      Hyperlipidemia is controlled with diet & meds. Patient denies myalgias or other med SE's. 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  Last A1c was near goal:  Lab Results  Component Value Date   HGBA1C 5.9 (H) 08/06/2020                                                      Patient was started on thyroid replacement when dx'd Hypothyroid in 2004.        Further, the patient also has history of Vitamin D Deficiency ("38" /2016) and supplements vitamin D without any suspected side-effects. Last vitamin D was at goal:  Lab Results  Component Value Date   VD25OH 70 08/06/2020    Current Outpatient Medications on File Prior to Visit  Medication Sig  . allopurinol  300 MG tablet Take 1/2 tablet Daily to Prevent Gout  . aspirin EC 81 MG tablet Take  daily.  Marland Kitchen atenolol  100 MG tablet Take 1 tablet by mouth once daily for blood pressure  . BESIVANCE 0.6 % SUSP Place 1 drop into the left eye 4  (four) times daily.   Marland Kitchen BLACK PEPPER-TURMERIC PO Take by mouth daily.  Marland Kitchen VITAMIN D 5000 units  Take 10,000 Units  daily.  . colchicine 0.6 MG tablet Take      1 tablet      Daily      to Prevent Gout  . furosemide (LASIX) 80 MG tablet Take 1 tablet 1-2 x /day as needed   . hyoscyamine 0.125 MG tablet Take 1 tablet  every 6  hours as needed.  . insulin (70-30)  Patient injects 25 units in the AM and 10 units in the PM. Reduce each insulin dose by 2 units if glucose persistently <100  . levothyroxine 125 MCG tablet Take 1 tablet daily  . losartan 100 MG tablet TAKE 1 TABLET  DAILY   . potassium chloride 10  MEQ TAKE 1 TABLET  EVERY DAY  . PRED FORTE 1 % ophth susp INSTILL 1 DROP INTO EACH EYE TWICE A DAY  . rosuvastatin  5 MG tablet Take 1 tablet Daily for Cholesterol  . Semaglutide,0.25 or 0.5MG /DOS, (OZEMPIC, 0.25 OR 0.5 MG/DOSE,) 2 MG/1.5ML SOPN Inject 0.25 mg into skin once a week for weight and diabetes. If tolerating well in 1 month, increase to 0.5 mg once weekly.    Allergies  Allergen Reactions  . Penicillins Swelling  . Sulfa Antibiotics Itching and Rash    PMHx:   Past Medical History:  Diagnosis Date  . Allergic rhinitis   . Allergy   . Anxiety    with medical procedures  . Hyperlipidemia   . Hypertension   . Obesity   . OSA (obstructive sleep apnea)    wears CPAP  . Type II or unspecified type diabetes mellitus with renal manifestations, not stated as uncontrolled(250.40)   . Unspecified hypothyroidism   . Vitamin D deficiency     Immunization History  Administered Date(s) Administered  . DT (Pediatric) 06/19/2015  . DTaP 12/09/2004  . PFIZER SARS-COV-2 Vaccination 02/03/2020, 02/28/2020, 10/09/2020  . Pneumococcal Conjugate-13 12/19/2014  . Pneumococcal Polysaccharide-23 03/02/2009, 01/17/2019    Past Surgical History:  Procedure Laterality Date  .  eye procedure     have treatment where a needle is stuck into left eye- since June 2014; now every six weeks   . DENTAL EXAMINATION UNDER ANESTHESIA    . TUBAL LIGATION      FHx:    Reviewed / unchanged  SHx:    Reviewed / unchanged   Systems Review:  Constitutional: Denies fever, chills, wt changes, headaches, insomnia, fatigue, night sweats, change in appetite. Eyes: Denies redness, blurred vision, diplopia, discharge, itchy, watery eyes.  ENT: Denies discharge, congestion, post nasal drip, epistaxis, sore throat, earache, hearing loss, dental pain, tinnitus, vertigo, sinus pain, snoring.  CV: Denies chest pain, palpitations, irregular heartbeat, syncope, dyspnea, diaphoresis, orthopnea, PND, claudication or edema. Respiratory: denies cough, dyspnea, DOE, pleurisy, hoarseness, laryngitis, wheezing.  Gastrointestinal: Denies dysphagia, odynophagia, heartburn, reflux, water brash, abdominal pain or cramps, nausea, vomiting, bloating, diarrhea, constipation, hematemesis, melena, hematochezia  or hemorrhoids. Genitourinary: Denies dysuria, frequency, urgency, nocturia, hesitancy, discharge, hematuria or flank pain. Musculoskeletal: Denies arthralgias, myalgias, stiffness, jt. swelling, pain, limping or strain/sprain.  Skin: Denies pruritus, rash, hives, warts, acne, eczema or change in skin lesion(s). Neuro: No weakness, tremor, incoordination, spasms, paresthesia or pain. Psychiatric: Denies confusion, memory loss or sensory loss. Endo: Denies change in weight, skin or hair change.  Heme/Lymph: No excessive bleeding, bruising or enlarged lymph nodes.  Physical Exam  BP 122/80   Pulse 69   Temp (!) 97.5 F (36.4 C)   Resp 16   Ht 5\' 2"  (1.575 m)   Wt 248 lb (112.5 kg)   SpO2 97%   BMI 45.36 kg/m   Appears  Over nourished  and in no distress.  Eyes: PERRLA, EOMs, conjunctiva no swelling or erythema. Sinuses: No frontal/maxillary tenderness ENT/Mouth: EAC's clear, TM's nl w/o erythema, bulging. Nares clear w/o erythema, swelling, exudates. Oropharynx clear without erythema or exudates.  Oral hygiene is good. Tongue normal, non obstructing. Hearing intact.  Neck: Supple. Thyroid not palpable. Car 2+/2+ without bruits, nodes or JVD. Chest: Respirations nl with BS clear & equal w/o rales, rhonchi, wheezing or stridor.  Cor: Heart sounds normal w/ regular rate and rhythm without sig. murmurs, gallops, clicks or rubs. Peripheral pulses normal and equal  without edema.  Abdomen: Soft & bowel sounds normal. Non-tender w/o guarding, rebound, hernias, masses or organomegaly.  Lymphatics: Unremarkable.  Musculoskeletal: Full ROM all peripheral extremities, joint stability, 5/5 strength and normal gait.  Skin: Warm, dry without exposed rashes, lesions or ecchymosis apparent.  Neuro: Cranial nerves intact, reflexes equal bilaterally. Sensory-motor testing grossly intact. Tendon reflexes grossly intact.  Pysch: Alert & oriented x 3.  Insight and judgement nl & appropriate. No ideations.  Assessment and Plan:  1. Essential hypertension  - Continue medication, monitor blood pressure at home.  - Continue DASH diet.  Reminder to go to the ER if any CP,  SOB, nausea, dizziness, severe HA, changes vision/speech.  - CBC with Differential/Platelet - COMPLETE METABOLIC PANEL WITH GFR - Magnesium - TSH  2. Hyperlipidemia associated with type 2 diabetes mellitus ([place])  - Continue diet/meds, exercise,& lifestyle modifications.  - Continue monitor periodic cholesterol/liver & renal functions   - Lipid panel - TSH - Hemoglobin A1c  3. Type 2 diabetes mellitus with stage 3b chronic kidney  disease, with long-term current use of insulin ([facility])  - Continue diet, exercise  - Lifestyle modifications.  - Monitor appropriate labs.  - Hemoglobin A1c  4. Vitamin D deficiency  - Continue supplementation.   - VITAMIN D 25 Hydroxy  5. Medication management  - CBC with Differential/Platelet - COMPLETE METABOLIC PANEL WITH GFR - Magnesium - Lipid panel - TSH - Hemoglobin A1c - VITAMIN  D 25 Hydroxy         Discussed  regular exercise, BP monitoring, weight control to achieve/maintain BMI less than 25 and discussed med and SE's. Recommended labs to assess and monitor clinical status with further disposition pending results of labs.  I discussed the assessment and treatment plan with the patient. The patient was provided an opportunity to ask questions and all were answered. The patient agreed with the plan and demonstrated an understanding of the instructions.  I provided over 30 minutes of exam, counseling, chart review and  complex critical decision making.         The patient was advised to call back or seek an in-person evaluation if the symptoms worsen or if the condition fails to improve as anticipated.   [name], MD

## 2020-11-08 ENCOUNTER — Ambulatory Visit (INDEPENDENT_AMBULATORY_CARE_PROVIDER_SITE_OTHER): Payer: Medicare Other | Admitting: Internal Medicine

## 2020-11-08 ENCOUNTER — Other Ambulatory Visit: Payer: Self-pay

## 2020-11-08 VITALS — BP 122/80 | HR 69 | Temp 97.5°F | Resp 16 | Ht 62.0 in | Wt 248.0 lb

## 2020-11-08 DIAGNOSIS — E785 Hyperlipidemia, unspecified: Secondary | ICD-10-CM

## 2020-11-08 DIAGNOSIS — N1832 Chronic kidney disease, stage 3b: Secondary | ICD-10-CM

## 2020-11-08 DIAGNOSIS — E559 Vitamin D deficiency, unspecified: Secondary | ICD-10-CM

## 2020-11-08 DIAGNOSIS — E1169 Type 2 diabetes mellitus with other specified complication: Secondary | ICD-10-CM | POA: Diagnosis not present

## 2020-11-08 DIAGNOSIS — E1122 Type 2 diabetes mellitus with diabetic chronic kidney disease: Secondary | ICD-10-CM | POA: Diagnosis not present

## 2020-11-08 DIAGNOSIS — I10 Essential (primary) hypertension: Secondary | ICD-10-CM

## 2020-11-08 DIAGNOSIS — Z794 Long term (current) use of insulin: Secondary | ICD-10-CM

## 2020-11-08 DIAGNOSIS — Z79899 Other long term (current) drug therapy: Secondary | ICD-10-CM

## 2020-11-09 LAB — HEMOGLOBIN A1C
Hgb A1c MFr Bld: 5.7 % of total Hgb — ABNORMAL HIGH (ref <5.7)
Mean Plasma Glucose: 117 (calc)
eAG (mmol/L): 6.5 (calc)

## 2020-11-09 LAB — LIPID PANEL
Cholesterol: 118 mg/dL (ref <200)
HDL: 39 mg/dL — ABNORMAL LOW (ref >=50)
LDL Cholesterol (Calc): 62 mg/dL (calc)
Non-HDL Cholesterol (Calc): 79 mg/dL (calc) (ref <130)
Total CHOL/HDL Ratio: 3 (calc) (ref <5.0)
Triglycerides: 89 mg/dL (ref <150)

## 2020-11-09 LAB — TSH: TSH: 1.22 mIU/L (ref 0.40–4.50)

## 2020-11-09 NOTE — Progress Notes (Signed)
========================================================== ==========================================================  -    Total Chol = 118  & LDL Chol = 62 - Both  Excellent   - Very low risk for Heart Attack  / Stroke ========================================================  - A1c - better - down from 5.9% to Now 5.7% - much better   - almost back to Normal nonDiabetic which is less than 5.7%  ==========================================================  - Keep up the Good work  ==========================================================

## 2020-11-19 ENCOUNTER — Other Ambulatory Visit: Payer: Self-pay

## 2020-11-19 ENCOUNTER — Encounter (INDEPENDENT_AMBULATORY_CARE_PROVIDER_SITE_OTHER): Payer: Medicare Other | Admitting: Ophthalmology

## 2020-11-19 DIAGNOSIS — H35033 Hypertensive retinopathy, bilateral: Secondary | ICD-10-CM | POA: Diagnosis not present

## 2020-11-19 DIAGNOSIS — I10 Essential (primary) hypertension: Secondary | ICD-10-CM | POA: Diagnosis not present

## 2020-11-19 DIAGNOSIS — H43813 Vitreous degeneration, bilateral: Secondary | ICD-10-CM

## 2020-11-19 DIAGNOSIS — H34812 Central retinal vein occlusion, left eye, with macular edema: Secondary | ICD-10-CM

## 2020-11-20 ENCOUNTER — Other Ambulatory Visit: Payer: Self-pay | Admitting: Internal Medicine

## 2020-11-20 DIAGNOSIS — E039 Hypothyroidism, unspecified: Secondary | ICD-10-CM

## 2020-11-21 ENCOUNTER — Other Ambulatory Visit: Payer: Self-pay

## 2020-11-21 ENCOUNTER — Ambulatory Visit
Admission: RE | Admit: 2020-11-21 | Discharge: 2020-11-21 | Disposition: A | Discharge status: Home / Self Care | Payer: Medicare Other | Source: Ambulatory Visit | Attending: Internal Medicine | Admitting: Internal Medicine

## 2020-11-21 DIAGNOSIS — Z0000 Encounter for general adult medical examination without abnormal findings: Secondary | ICD-10-CM

## 2020-12-23 ENCOUNTER — Other Ambulatory Visit: Payer: Self-pay | Admitting: Internal Medicine

## 2020-12-23 DIAGNOSIS — M1000 Idiopathic gout, unspecified site: Secondary | ICD-10-CM

## 2020-12-23 MED ORDER — ALLOPURINOL 300 MG PO TABS: ORAL_TABLET | ORAL | 0 refills | Status: DC

## 2021-01-07 ENCOUNTER — Encounter (INDEPENDENT_AMBULATORY_CARE_PROVIDER_SITE_OTHER): Payer: Medicare Other | Admitting: Ophthalmology

## 2021-01-08 ENCOUNTER — Encounter (INDEPENDENT_AMBULATORY_CARE_PROVIDER_SITE_OTHER): Payer: Medicare Other | Admitting: Ophthalmology

## 2021-01-08 ENCOUNTER — Other Ambulatory Visit: Payer: Self-pay

## 2021-01-08 DIAGNOSIS — H2511 Age-related nuclear cataract, right eye: Secondary | ICD-10-CM

## 2021-01-08 DIAGNOSIS — H43813 Vitreous degeneration, bilateral: Secondary | ICD-10-CM | POA: Diagnosis not present

## 2021-01-08 DIAGNOSIS — H34812 Central retinal vein occlusion, left eye, with macular edema: Secondary | ICD-10-CM

## 2021-01-08 DIAGNOSIS — I10 Essential (primary) hypertension: Secondary | ICD-10-CM | POA: Diagnosis not present

## 2021-01-08 DIAGNOSIS — H35033 Hypertensive retinopathy, bilateral: Secondary | ICD-10-CM

## 2021-01-11 ENCOUNTER — Other Ambulatory Visit: Payer: Self-pay | Admitting: Adult Health

## 2021-01-27 ENCOUNTER — Other Ambulatory Visit: Payer: Self-pay | Admitting: Internal Medicine

## 2021-01-27 DIAGNOSIS — M1A9XX0 Chronic gout, unspecified, without tophus (tophi): Secondary | ICD-10-CM

## 2021-02-05 ENCOUNTER — Encounter: Payer: Self-pay | Admitting: Internal Medicine

## 2021-02-25 ENCOUNTER — Encounter (INDEPENDENT_AMBULATORY_CARE_PROVIDER_SITE_OTHER): Payer: Medicare Other | Admitting: Ophthalmology

## 2021-02-25 ENCOUNTER — Other Ambulatory Visit: Payer: Self-pay | Admitting: Adult Health

## 2021-02-25 ENCOUNTER — Other Ambulatory Visit: Payer: Self-pay

## 2021-02-25 DIAGNOSIS — H35033 Hypertensive retinopathy, bilateral: Secondary | ICD-10-CM

## 2021-02-25 DIAGNOSIS — I10 Essential (primary) hypertension: Secondary | ICD-10-CM | POA: Diagnosis not present

## 2021-02-25 DIAGNOSIS — H43813 Vitreous degeneration, bilateral: Secondary | ICD-10-CM

## 2021-02-25 DIAGNOSIS — H34812 Central retinal vein occlusion, left eye, with macular edema: Secondary | ICD-10-CM | POA: Diagnosis not present

## 2021-03-04 ENCOUNTER — Ambulatory Visit (INDEPENDENT_AMBULATORY_CARE_PROVIDER_SITE_OTHER): Payer: Medicare Other | Admitting: Adult Health Nurse Practitioner

## 2021-03-04 ENCOUNTER — Encounter: Payer: Self-pay | Admitting: Adult Health Nurse Practitioner

## 2021-03-04 ENCOUNTER — Other Ambulatory Visit: Payer: Self-pay

## 2021-03-04 VITALS — BP 134/72 | HR 69 | Temp 97.3°F | Wt 247.2 lb

## 2021-03-04 DIAGNOSIS — Z794 Long term (current) use of insulin: Secondary | ICD-10-CM

## 2021-03-04 DIAGNOSIS — E559 Vitamin D deficiency, unspecified: Secondary | ICD-10-CM

## 2021-03-04 DIAGNOSIS — Z6841 Body Mass Index (BMI) 40.0 and over, adult: Secondary | ICD-10-CM

## 2021-03-04 DIAGNOSIS — G4733 Obstructive sleep apnea (adult) (pediatric): Secondary | ICD-10-CM | POA: Diagnosis not present

## 2021-03-04 DIAGNOSIS — I10 Essential (primary) hypertension: Secondary | ICD-10-CM

## 2021-03-04 DIAGNOSIS — M1A9XX0 Chronic gout, unspecified, without tophus (tophi): Secondary | ICD-10-CM

## 2021-03-04 DIAGNOSIS — N1832 Chronic kidney disease, stage 3b: Secondary | ICD-10-CM

## 2021-03-04 DIAGNOSIS — I878 Other specified disorders of veins: Secondary | ICD-10-CM

## 2021-03-04 DIAGNOSIS — Z0000 Encounter for general adult medical examination without abnormal findings: Secondary | ICD-10-CM

## 2021-03-04 DIAGNOSIS — E785 Hyperlipidemia, unspecified: Secondary | ICD-10-CM

## 2021-03-04 DIAGNOSIS — R6889 Other general symptoms and signs: Secondary | ICD-10-CM | POA: Diagnosis not present

## 2021-03-04 DIAGNOSIS — F411 Generalized anxiety disorder: Secondary | ICD-10-CM

## 2021-03-04 DIAGNOSIS — Z0001 Encounter for general adult medical examination with abnormal findings: Secondary | ICD-10-CM

## 2021-03-04 DIAGNOSIS — E1169 Type 2 diabetes mellitus with other specified complication: Secondary | ICD-10-CM

## 2021-03-04 DIAGNOSIS — L03116 Cellulitis of left lower limb: Secondary | ICD-10-CM

## 2021-03-04 DIAGNOSIS — E039 Hypothyroidism, unspecified: Secondary | ICD-10-CM

## 2021-03-04 DIAGNOSIS — Z79899 Other long term (current) drug therapy: Secondary | ICD-10-CM

## 2021-03-04 DIAGNOSIS — N183 Chronic kidney disease, stage 3 unspecified: Secondary | ICD-10-CM

## 2021-03-04 DIAGNOSIS — H3530 Unspecified macular degeneration: Secondary | ICD-10-CM

## 2021-03-04 DIAGNOSIS — E1122 Type 2 diabetes mellitus with diabetic chronic kidney disease: Secondary | ICD-10-CM

## 2021-03-04 MED ORDER — DOXYCYCLINE HYCLATE 100 MG PO TABS: 100.0000 mg | ORAL_TABLET | Freq: Two times a day (BID) | ORAL | 0 refills | Status: AC

## 2021-03-04 MED ORDER — OZEMPIC (0.25 OR 0.5 MG/DOSE) 2 MG/1.5ML ~~LOC~~ SOPN: PEN_INJECTOR | SUBCUTANEOUS | 1 refills | Status: DC

## 2021-03-04 NOTE — Progress Notes (Signed)
MEDICARE ANNUAL WELLNESS   Assessment and Plan:  Encounter for Medicare annual wellness exam 1 year  Essential hypertension Continue current medications: Monitor blood pressure at home; call if consistently over 130/80 Continue DASH diet.   Reminder to go to the ER if any CP, SOB, nausea, dizziness, severe HA, changes vision/speech, left arm numbness and tingling and jaw pain. - CMP/GFR  OSA (obstructive sleep apnea) Weight loss advised, needs to wear nightly, may benefit from new mask/titration   Hypothyroidism, unspecified hypothyroidism type Taking levothyroxine 11mg daily Reminder to take on an empty stomach 30-637ms before first meal of the day. No antacid medications for 4 hours. - TSH   Hyperlipidemia associated with T2DM (HCC) Continue medications:Losartan 10091miscussed dietary and exercise modifications Low fat diet - Lipid panel   Vitamin D deficiency Continue supplementation to maintain goal of 70-100 Taking Vitamin D 5,00IU, 2 tabs daily Defer vitamin D level   Obesity, morbid Obesity with co morbidities- long discussion about weight loss, diet, and exercise  Medication management - Magnesium  CKD stage 3 due to type 2 diabetes mellitus Follows with nephrology, next OV two weeks Increase fluids, avoid NSAIDS, monitor sugars, will monitor  - CMP/GFR  Anxiety Will take valium prior to procedures but would like to be able to take AS needed for sleep, long discussion about addictive nature and tolerance, will only take PRN.   Macular degeneration, unspecified laterality, unspecified type  continue follow up eye doctor  Chronic gout without tophus, unspecified cause, unspecified site Gout- recheck Uric acid today , Diet discussed, continue medications.  Type 2 diabetes mellitus with hyperlipidemia (HCC) Continue 70/30, 25untis in am and 10unit PM. Rx Ozempic for glucose and added weight benefit. Discussed general issues about diabetes  pathophysiology and management., Educational material distributed., Suggested low cholesterol diet., Encouraged aerobic exercise., Discussed foot care., Reminded to get yearly retinal exam. -     Hemoglobin A1c  Diabetic [place] ([place] for [place] cellulitis Rx Doxycycline 100m8mDx7days Increase Lasix BID x3days ONLY Continue with this and close monitoring;  Hx of recurrent stasis ulcers; reviewed edema management; elevate extremities, wear compression when not able to elevate legs   Further disposition pending results if labs check today. Discussed med's effects and SE's.   Over 30 minutes of face to face interview, exam, counseling, chart review, and critical decision making was performed.    Discussed med's effects and SE's. Screening labs and tests as requested with regular follow-up as recommended. Future Appointments  Date Time Provider DepaOmer11/2022  8:45 AM MattHayden Pedro [facility] None  08/08/2021  9:00 AM CorbLiane Comber [facility] None  03/04/2022 10:30 AM McClGarnet Sierras [facility] None    Plan:   During the course of the visit the patient was educated and counseled about appropriate screening and preventive services including:    Pneumococcal vaccine   Prevnar 13  Influenza vaccine  Td vaccine  Screening electrocardiogram  Bone densitometry screening  Colorectal cancer screening  Diabetes screening  Glaucoma screening  Nutrition counseling   Advanced directives: requested   HPI  Jocelyn Camacho. AA female  presents for medicare wellness and 3 month follow up.   She has hx of venous stasis, recurrent LE ulcers, reports today has L shin ulcer. Has been applying betadine/sugar slurry.  Reports this has been increasing tenderness and some tightness.  Reports that she is not able to see the area.  Reports her husband looks at the areas. She  denies discharge can't recall if yellow/purulent. Currently clear dressing  over this.  She is getting shots for Mac Degen with Dr. Wendelyn Breslow, she is prescribed valium to take prior to eye injections.   She has OSA and does CPAP though admits to using sporadically/infrequently, states she falls asleep in the den and forgets to put on. Discussed mask hygiene.    She is following with ortho Dr. Rip Harbour at [facility] for bilateral knee pain and has been advised may need TKA at some point. She had injections 6-8 months ago and did have relief until recently.  She will take tylenol only if needed, uses OTC topicals and takes tumeric/bioprene supplement daily. Reports that she is having and increase in discomfort that is constant and movement makes it worse, bilaterally.  She has not noticed any swelling but reports she can feel some tightness in the right knee.  She does use a cane for ambulation but left it in the car today.  BMI is Body mass index is 45.21 kg/m., she has been working on diet, reducing white items/starches, and sweets. Exercise is admittedly limited due to knee.  Wt Readings from Last 3 Encounters:  03/04/21 247 lb 3.2 oz (112.1 kg)  11/08/20 248 lb (112.5 kg)  10/24/20 249 lb 12.8 oz (113.3 kg)   Her blood pressure has been controlled at home, today their BP is BP: 134/72 She does not workout due to knee pain.  She denies chest pain, shortness of breath, dizziness.   She is on cholesterol medication and denies myalgias. Her cholesterol is at goal. The cholesterol last visit was:   Lab Results  Component Value Date   CHOL 118 11/08/2020   HDL 39 (L) 11/08/2020   LDLCALC 62 11/08/2020   TRIG 89 11/08/2020   CHOLHDL 3.0 11/08/2020   She has had diabetes for 3 years. She has been working on diet and exercise for Diabetes with CKD stage III, she is on ACE, she is on bASA, She is off metformin due to kidneys, she is on 70/30 insulin 25 units and 10 units and denies paresthesia of the feet, polydipsia and polyuria. She is checking her sugars,  fasting ranging from 90-140, average 120. Checks sugars twice a day with one touch ultra.  Would like to use and would benefit from Ozempic added to her current regiment. Will place order today. Denies any hypoglycemia. Last A1C in the office was:  Lab Results  Component Value Date   HGBA1C 5.7 (H) 11/08/2020   Follows with Nephrology, next OV in two weeks. She has mild edema since reducing dose, typically well managed by compression.  Lab Results  Component Value Date   GFRAA 41 (L) 08/06/2020   Patient is on Vitamin D supplement.   Lab Results  Component Value Date   VD25OH 48 08/06/2020     She is on thyroid medication. Her medication was not changed last visit. She is on 125 mcg daily. She takes it 30 mins before food with just water.  Lab Results  Component Value Date   TSH 1.22 11/08/2020   Patient is on allopurinol for gout and does not report a recent flare.  Lab Results  Component Value Date   LABURIC 7.2 (H) 08/06/2020     Current Medications:  Current Outpatient Medications on File Prior to Visit  Medication Sig Dispense Refill  . allopurinol (ZYLOPRIM) 300 MG tablet Take       1/2 tablet (150 mg)  Daily          to Prevent Gout 45 tablet 0  . aspirin EC 81 MG tablet Take 81 mg by mouth daily.    Marland Kitchen atenolol (TENORMIN) 100 MG tablet Take 1 tablet by mouth once daily for blood pressure 90 tablet 0  . BESIVANCE 0.6 % SUSP Place 1 drop into the left eye 4 (four) times daily.     Marland Kitchen BLACK PEPPER-TURMERIC PO Take by mouth daily.    . Blood Glucose Monitoring Suppl (ONE TOUCH ULTRA 2) w/Device KIT check blood sugar 3 times daily-DX-E11.21 1 kit 0  . Cholecalciferol (VITAMIN D3) 5000 units CAPS Take 10,000 Units by mouth daily.    . colchicine 0.6 MG tablet Take  1 tablet  Daily  to Prevent Gout 90 tablet 0  . furosemide (LASIX) 80 MG tablet Take 1 tablet 1-2 x /day as needed for Fluid Retention / Ankle Swelling 180 tablet 3  . hyoscyamine (LEVSIN, ANASPAZ) 0.125 MG  tablet Take 1 tablet (0.125 mg total) by mouth every 6 (six) hours as needed. 60 tablet 1  . insulin NPH-regular Human (70-30) 100 UNIT/ML injection Patient injects 25 units in the AM and 10 units in the PM. Reduce each insulin dose by 2 units if glucose persistently <100 10 mL   . Lancets (ONETOUCH ULTRASOFT) lancets Use as instructed Dx. Code: E11.22 100 each 12  . Lancets (ONETOUCH ULTRASOFT) lancets Check blood sugar 3 times daily-DX-E10.9 300 each 1  . levothyroxine (SYNTHROID) 125 MCG tablet TAKE 1 TABLET DAILY ON AN EMPTY STOMACH WITH ONLY WATER FO 30 MINUTES AND NO ANTACID MEDS, CALCIUM OR MAGNESIUM FOR 4 HOURS AND AVOID BIOTIN 90 tablet 3  . losartan (COZAAR) 100 MG tablet TAKE 1 TABLET BY MOUTH ONCE DAILY FOR BLOOD PRESSURE AND KIDNEY PROTECTION 90 tablet 3  . ONETOUCH ULTRA test strip CHECK BLOOD SUGAR 3 TIMES A DAY 300 strip 3  . potassium chloride (KLOR-CON) 10 MEQ tablet TAKE 1 TABLET BY MOUTH EVERY DAY 90 tablet 3  . prednisoLONE acetate (PRED FORTE) 1 % ophthalmic suspension INSTILL 1 DROP INTO EACH EYE TWICE A DAY 5 mL 0  . rosuvastatin (CRESTOR) 5 MG tablet Take 1 tablet Daily for Cholesterol 90 tablet 3  . Semaglutide,0.25 or 0.5MG/DOS, (OZEMPIC, 0.25 OR 0.5 MG/DOSE,) 2 MG/1.5ML SOPN Inject 0.25 mg into skin once a week for weight and diabetes. If tolerating well in 1 month, increase to 0.5 mg once weekly. (Patient not taking: Reported on 03/04/2021) 4.5 mL 0   No current facility-administered medications on file prior to visit.   Health Maintenance:   Immunization History  Administered Date(s) Administered  . DT (Pediatric) 06/19/2015  . DTaP 12/09/2004  . PFIZER(Purple Top)SARS-COV-2 Vaccination 02/03/2020, 02/28/2020, 10/09/2020  . Pneumococcal Conjugate-13 12/19/2014  . Pneumococcal Polysaccharide-23 03/02/2009, 01/17/2019   Last colonoscopy: 03/2014 normal Last mammogram: 11/2020 Last pap smear/pelvic exam: 2012, DONE DEXA: 11/2015 normal Carotid US 2013 CXR  2009  ECHO: 06/29/2018 - Aortic valve: Moderate focal calcification involving the left   coronary cusp. EF 25-85%, grade 1 diastolic dysfunction  Prior vaccinations: TD or Tdap: 2016 Influenza: declines Pneumococcal: 2010, 12/2018 Prevnar 13: 2015 Shingles/Zostavax: declines  Eye exam Dr. Zigmund Daniel sees q6w for injections, had diabetes eye report 06/14/2019 Report received and abstracted Dental: Dr. Cristi Loron, last exam a few years ago, ? 2019, will schedule DUE   Foot exam: 08/04/2019   Patient Care Team: [name], MD as PCP - General (Internal Medicine) Warden Fillers, MD  as Consulting Physician (Optometry) [name], MD as Consulting Physician (Ophthalmology) Inda Castle, MD (Inactive) as Consulting Physician (Gastroenterology) Druscilla Brownie, MD as Consulting Physician (Dermatology) Allyn Kenner, MD (Dermatology)   Medical History:  Past Medical History:  Diagnosis Date  . Allergic rhinitis   . Allergy   . Anxiety    with medical procedures  . Hyperlipidemia   . Hypertension   . Obesity   . OSA (obstructive sleep apnea)    wears CPAP  . Type II or unspecified type diabetes mellitus with renal manifestations, not stated as uncontrolled(250.40)   . Unspecified hypothyroidism   . Vitamin D deficiency    Allergies Allergies  Allergen Reactions  . Penicillins Swelling  . Sulfa Antibiotics Itching and Rash    SURGICAL HISTORY She  has a past surgical history that includes  eye procedure; Tubal ligation; and Dental examination under anesthesia. FAMILY HISTORY Her family history includes Breast cancer in her mother; Diabetes in her maternal grandmother and sister; Hypertension in her sister; Renal Disease in her mother. SOCIAL HISTORY She  reports that she has never smoked. She has never used smokeless tobacco. She reports current alcohol use. She reports that she does not use drugs.  MEDICARE WELLNESS OBJECTIVES: Physical activity: Current  Exercise Habits: The patient does not participate in regular exercise at present, Exercise limited by: orthopedic condition(s) Cardiac risk factors: Cardiac Risk Factors include: advanced age (>1mn, >>81women);dyslipidemia;hypertension;obesity (BMI >30kg/m2);sedentary lifestyle Depression/mood screen:   [facility] 03/04/2021  Decreased Interest 0  Down, Depressed, Hopeless 0  PHQ - 2 Score 0  Some recent data might be hidden    ADLs:  In your present state of health, do you have any difficulty performing the following activities: 03/04/2021 11/07/2020  Hearing? N N  Vision? N N  Difficulty concentrating or making decisions? N N  Walking or climbing stairs? N N  Dressing or bathing? N N  Doing errands, shopping? N N  Using the Toilet? N -  In the past six months, have you accidently leaked urine? N -  Do you have problems with loss of bowel control? N -  Managing your Medications? N -  Managing your Finances? N -  Housekeeping or managing your Housekeeping? N -  Some recent data might be hidden     Cognitive Testing  Alert? Yes  Normal Appearance?Yes  Oriented to person? Yes  Place? Yes   Time? Yes  Recall of three objects?  Yes  Can perform simple calculations? Yes  Displays appropriate judgment?Yes  Can read the correct time from a watch face?Yes  EOL planning: Does Patient Have a Medical Advance Directive?: No Would patient like information on creating a medical advance directive?: No - Patient declined   Review of Systems  Constitutional: Negative.  Negative for malaise/fatigue and weight loss.  HENT: Negative.  Negative for hearing loss and tinnitus.   Eyes: Negative.  Negative for blurred vision and double vision.  Respiratory: Positive for cough. Negative for shortness of breath and wheezing.   Cardiovascular: Negative.  Negative for chest pain, palpitations, orthopnea, claudication and leg swelling.  Gastrointestinal: Negative.  Negative for  abdominal pain, blood in stool, constipation, diarrhea, heartburn, melena, nausea and vomiting.  Genitourinary: Negative.  Negative for dysuria, flank pain, frequency, hematuria and urgency.  Musculoskeletal: Positive for joint pain (bilateral knee pain). Negative for myalgias.  Skin: Negative.  Negative for rash.  Neurological: Negative.  Negative for dizziness, tingling, sensory change, weakness and headaches.  Endo/Heme/Allergies: Negative.  Negative for polydipsia.  Psychiatric/Behavioral: Negative.  Negative for depression, hallucinations, memory loss, substance abuse and suicidal ideas. The patient is not nervous/anxious and does not have insomnia.   All other systems reviewed and are negative.   Physical Exam: Estimated body mass index is 45.21 kg/m as calculated from the following:   Height as of 11/08/20: _0  (1.575 m).   Weight as of this encounter: 247 lb 3.2 oz (112.1 kg). BP 134/72   Pulse 69   Temp (!) 97.3 F (36.3 C)   Wt 247 lb 3.2 oz (112.1 kg)   SpO2 98%   BMI 45.21 kg/m  General Appearance: Well nourished, in no apparent distress.  Eyes: PERRLA, EOMs, conjunctiva no swelling or erythema, normal fundi and vessels.  Sinuses: No Frontal/maxillary tenderness  ENT/Mouth: Ext aud canals clear, normal light reflex with TMs without erythema, bulging. Good dentition. No erythema, swelling, or exudate on post pharynx. Tonsils not swollen or erythematous. Hearing normal.  Neck: Supple, thyroid normal. No bruits  Respiratory: Respiratory effort normal, BS equal bilaterally without rales, rhonchi, wheezing or stridor.  Cardio: RRR with normal S1/S2, without rubs or gallops. Brisk peripheral pulses with mild edema.  Chest: symmetric, with normal excursions and percussion.  Breasts: declines, states getting MGM Abdomen: Soft, nontender, no guarding, rebound, hernias, masses, or organomegaly. .  Lymphatics: Non tender without lymphadenopathy.  Genitourinary:  defer Musculoskeletal: Full ROM all peripheral extremities, 5/5 strength, and antalgic gait.  Skin: Vitiligo bilateral hands and feet. Warm, dry without rashes, lesions, ecchymosis. She has open ulcerated wound to left shin, approx 2.5 x 1.5 cm area, breakdown to fatty layer, surrounding erythema, warm to touch, pitting noted around open area. No drainage or exudate noted. Covered with clear dressing. Neuro: Cranial nerves intact, reflexes equal bilaterally. Normal muscle tone, no cerebellar symptoms. Sensation intact.  Psych: Awake and oriented X 3, normal affect, Insight and Judgment appropriate.    Medicare Attestation I have personally reviewed: The patient's medical and social history Their use of alcohol, tobacco or illicit drugs Their current medications and supplements The patient's functional ability including ADLs,fall risks, home safety risks, cognitive, and hearing and visual impairment Diet and physical activities Evidence for depression or mood disorders  The patient's weight, height, BMI, and visual acuity have been recorded in the chart.  I have made referrals, counseling, and provided education to the patient based on review of the above and I have provided the patient with a written personalized care plan for preventive services.     Garnet Sierras, Laqueta Jean, DNP [facility] 03/04/2021  11:26 AM

## 2021-03-05 LAB — COMPLETE METABOLIC PANEL WITH GFR
AG Ratio: 1.3 (calc) (ref 1.0–2.5)
ALT: 12 U/L (ref 6–29)
AST: 16 U/L (ref 10–35)
Albumin: 3.9 g/dL (ref 3.6–5.1)
Alkaline phosphatase (APISO): 78 U/L (ref 37–153)
BUN/Creatinine Ratio: 20 (calc) (ref 6–22)
BUN: 28 mg/dL — ABNORMAL HIGH (ref 7–25)
CO2: 31 mmol/L (ref 20–32)
Calcium: 9.8 mg/dL (ref 8.6–10.4)
Chloride: 105 mmol/L (ref 98–110)
Creat: 1.38 mg/dL — ABNORMAL HIGH (ref 0.60–0.93)
GFR, Est African American: 44 mL/min/{1.73_m2} — ABNORMAL LOW (ref >=60)
GFR, Est Non African American: 38 mL/min/{1.73_m2} — ABNORMAL LOW (ref >=60)
Globulin: 2.9 g/dL (calc) (ref 1.9–3.7)
Glucose, Bld: 81 mg/dL (ref 65–99)
Potassium: 4.1 mmol/L (ref 3.5–5.3)
Sodium: 145 mmol/L (ref 135–146)
Total Bilirubin: 0.9 mg/dL (ref 0.2–1.2)
Total Protein: 6.8 g/dL (ref 6.1–8.1)

## 2021-03-05 LAB — CBC WITH DIFFERENTIAL/PLATELET
Absolute Monocytes: 640 cells/uL (ref 200–950)
Basophils Absolute: 87 cells/uL (ref 0–200)
Basophils Relative: 1.1 %
Eosinophils Absolute: 158 cells/uL (ref 15–500)
Eosinophils Relative: 2 %
HCT: 33.1 % — ABNORMAL LOW (ref 35.0–45.0)
Hemoglobin: 10.7 g/dL — ABNORMAL LOW (ref 11.7–15.5)
Lymphs Abs: 2465 cells/uL (ref 850–3900)
MCH: 28.4 pg (ref 27.0–33.0)
MCHC: 32.3 g/dL (ref 32.0–36.0)
MCV: 87.8 fL (ref 80.0–100.0)
MPV: 11.6 fL (ref 7.5–12.5)
Monocytes Relative: 8.1 %
Neutro Abs: 4550 cells/uL (ref 1500–7800)
Neutrophils Relative %: 57.6 %
Platelets: 343 10*3/uL (ref 140–400)
RBC: 3.77 10*6/uL — ABNORMAL LOW (ref 3.80–5.10)
RDW: 14 % (ref 11.0–15.0)
Total Lymphocyte: 31.2 %
WBC: 7.9 10*3/uL (ref 3.8–10.8)

## 2021-03-05 LAB — HEMOGLOBIN A1C
Hgb A1c MFr Bld: 5.7 % of total Hgb — ABNORMAL HIGH (ref <5.7)
Mean Plasma Glucose: 117 mg/dL
eAG (mmol/L): 6.5 mmol/L

## 2021-03-05 LAB — LIPID PANEL
Cholesterol: 104 mg/dL (ref <200)
HDL: 34 mg/dL — ABNORMAL LOW (ref >=50)
LDL Cholesterol (Calc): 54 mg/dL (calc)
Non-HDL Cholesterol (Calc): 70 mg/dL (calc) (ref <130)
Total CHOL/HDL Ratio: 3.1 (calc) (ref <5.0)
Triglycerides: 84 mg/dL (ref <150)

## 2021-03-05 LAB — TSH: TSH: 1.02 mIU/L (ref 0.40–4.50)

## 2021-03-05 LAB — URIC ACID: Uric Acid, Serum: 6.2 mg/dL (ref 2.5–7.0)

## 2021-03-08 NOTE — Progress Notes (Signed)
Patient is aware of lab results and would like to try Jardiance. I told her that I would let you know and we would reach out to her the first part of next week. -e welch

## 2021-03-14 ENCOUNTER — Other Ambulatory Visit: Payer: Self-pay | Admitting: Adult Health Nurse Practitioner

## 2021-03-14 DIAGNOSIS — E1122 Type 2 diabetes mellitus with diabetic chronic kidney disease: Secondary | ICD-10-CM

## 2021-03-14 DIAGNOSIS — N1832 Chronic kidney disease, stage 3b: Secondary | ICD-10-CM

## 2021-03-14 MED ORDER — EMPAGLIFLOZIN 10 MG PO TABS: 10.0000 mg | ORAL_TABLET | Freq: Every day | ORAL | 3 refills | Status: DC

## 2021-04-01 ENCOUNTER — Encounter (INDEPENDENT_AMBULATORY_CARE_PROVIDER_SITE_OTHER): Payer: Medicare Other | Admitting: Ophthalmology

## 2021-04-01 ENCOUNTER — Other Ambulatory Visit: Payer: Self-pay

## 2021-04-01 DIAGNOSIS — H35033 Hypertensive retinopathy, bilateral: Secondary | ICD-10-CM | POA: Diagnosis not present

## 2021-04-01 DIAGNOSIS — I10 Essential (primary) hypertension: Secondary | ICD-10-CM | POA: Diagnosis not present

## 2021-04-01 DIAGNOSIS — H34812 Central retinal vein occlusion, left eye, with macular edema: Secondary | ICD-10-CM | POA: Diagnosis not present

## 2021-04-01 DIAGNOSIS — H43813 Vitreous degeneration, bilateral: Secondary | ICD-10-CM | POA: Diagnosis not present

## 2021-04-11 ENCOUNTER — Other Ambulatory Visit: Payer: Self-pay | Admitting: Internal Medicine

## 2021-04-11 DIAGNOSIS — E1122 Type 2 diabetes mellitus with diabetic chronic kidney disease: Secondary | ICD-10-CM

## 2021-04-11 DIAGNOSIS — Z794 Long term (current) use of insulin: Secondary | ICD-10-CM

## 2021-04-11 MED ORDER — KERENDIA 10 MG PO TABS
10.0000 mg | ORAL_TABLET | Freq: Every day | ORAL | 0 refills | Status: AC
Start: 2021-04-11 — End: ?

## 2021-04-13 ENCOUNTER — Other Ambulatory Visit: Payer: Self-pay | Admitting: Internal Medicine

## 2021-04-13 DIAGNOSIS — M1000 Idiopathic gout, unspecified site: Secondary | ICD-10-CM

## 2021-04-18 ENCOUNTER — Other Ambulatory Visit (HOSPITAL_BASED_OUTPATIENT_CLINIC_OR_DEPARTMENT_OTHER): Payer: Self-pay

## 2021-04-18 ENCOUNTER — Other Ambulatory Visit: Payer: Self-pay

## 2021-04-18 ENCOUNTER — Ambulatory Visit: Payer: Medicare Other | Attending: Internal Medicine

## 2021-04-18 DIAGNOSIS — Z23 Encounter for immunization: Secondary | ICD-10-CM

## 2021-04-18 MED ORDER — PFIZER-BIONT COVID-19 VAC-TRIS 30 MCG/0.3ML IM SUSP
INTRAMUSCULAR | 0 refills | Status: DC
  Filled 2021-04-18: qty 0.3, 1d supply, fill #0

## 2021-04-18 NOTE — Progress Notes (Signed)
   Covid-19 Vaccination Clinic  Name:  Jocelyn Camacho    MRN: [id number] DOB: [birth year]  04/18/2021  [name] was observed post Covid-19 immunization for 15 minutes without incident. She was provided with Vaccine Information Sheet and instruction to access the V-Safe system.   [name] was instructed to call 911 with any severe reactions post vaccine: Marland Kitchen Difficulty breathing  . Swelling of face and throat  . A fast heartbeat  . A bad rash all over body  . Dizziness and weakness   Immunizations Administered    Name Date Dose VIS Date Route   PFIZER Comrnaty(Gray TOP) Covid-19 Vaccine 04/18/2021  2:12 PM 0.3 mL 11/29/2020 Intramuscular   Manufacturer: [employer], [employer]   Lot: [id number]   NDC: [phone]

## 2021-05-06 ENCOUNTER — Encounter (INDEPENDENT_AMBULATORY_CARE_PROVIDER_SITE_OTHER): Payer: Medicare Other | Admitting: Ophthalmology

## 2021-05-06 ENCOUNTER — Other Ambulatory Visit: Payer: Self-pay

## 2021-05-06 ENCOUNTER — Other Ambulatory Visit: Payer: Self-pay | Admitting: Internal Medicine

## 2021-05-06 DIAGNOSIS — H43813 Vitreous degeneration, bilateral: Secondary | ICD-10-CM

## 2021-05-06 DIAGNOSIS — H35033 Hypertensive retinopathy, bilateral: Secondary | ICD-10-CM

## 2021-05-06 DIAGNOSIS — I10 Essential (primary) hypertension: Secondary | ICD-10-CM | POA: Diagnosis not present

## 2021-05-06 DIAGNOSIS — H34812 Central retinal vein occlusion, left eye, with macular edema: Secondary | ICD-10-CM

## 2021-05-06 DIAGNOSIS — M1A9XX0 Chronic gout, unspecified, without tophus (tophi): Secondary | ICD-10-CM

## 2021-05-24 ENCOUNTER — Other Ambulatory Visit: Payer: Self-pay | Admitting: Internal Medicine

## 2021-05-24 DIAGNOSIS — N182 Chronic kidney disease, stage 2 (mild): Secondary | ICD-10-CM

## 2021-05-24 DIAGNOSIS — E1122 Type 2 diabetes mellitus with diabetic chronic kidney disease: Secondary | ICD-10-CM

## 2021-06-10 ENCOUNTER — Other Ambulatory Visit: Payer: Self-pay

## 2021-06-10 ENCOUNTER — Encounter (INDEPENDENT_AMBULATORY_CARE_PROVIDER_SITE_OTHER): Payer: Medicare Other | Admitting: Ophthalmology

## 2021-06-10 DIAGNOSIS — H43813 Vitreous degeneration, bilateral: Secondary | ICD-10-CM

## 2021-06-10 DIAGNOSIS — H35033 Hypertensive retinopathy, bilateral: Secondary | ICD-10-CM

## 2021-06-10 DIAGNOSIS — I10 Essential (primary) hypertension: Secondary | ICD-10-CM | POA: Diagnosis not present

## 2021-06-10 DIAGNOSIS — H34812 Central retinal vein occlusion, left eye, with macular edema: Secondary | ICD-10-CM | POA: Diagnosis not present

## 2021-06-25 ENCOUNTER — Telehealth: Payer: Self-pay | Admitting: Adult Health

## 2021-06-25 NOTE — Telephone Encounter (Signed)
Patient called office, she would like to know if she is a candidate for the No Needle Stick Glucose monitor wore on arm. If so, She would like to consider. Please advise recommendation. Patient states that she received solicitation  regarding, but feared it was a scam.

## 2021-07-15 ENCOUNTER — Other Ambulatory Visit: Payer: Self-pay

## 2021-07-15 ENCOUNTER — Encounter (INDEPENDENT_AMBULATORY_CARE_PROVIDER_SITE_OTHER): Payer: Medicare Other | Admitting: Ophthalmology

## 2021-07-15 DIAGNOSIS — H348121 Central retinal vein occlusion, left eye, with retinal neovascularization: Secondary | ICD-10-CM

## 2021-07-15 DIAGNOSIS — H43813 Vitreous degeneration, bilateral: Secondary | ICD-10-CM | POA: Diagnosis not present

## 2021-07-15 DIAGNOSIS — H35033 Hypertensive retinopathy, bilateral: Secondary | ICD-10-CM | POA: Diagnosis not present

## 2021-07-15 DIAGNOSIS — I10 Essential (primary) hypertension: Secondary | ICD-10-CM | POA: Diagnosis not present

## 2021-07-22 ENCOUNTER — Other Ambulatory Visit: Payer: Self-pay

## 2021-07-22 MED ORDER — ROSUVASTATIN CALCIUM 5 MG PO TABS: ORAL_TABLET | ORAL | 3 refills | Status: DC

## 2021-08-05 ENCOUNTER — Other Ambulatory Visit: Payer: Self-pay | Admitting: Adult Health Nurse Practitioner

## 2021-08-07 NOTE — Progress Notes (Signed)
Complete Physical  Assessment and Plan:  Diagnoses and all orders for this visit:  Encounter for Annual Physical Exam with abnormal findings Due annually  Health Maintenance reviewed Healthy lifestyle reviewed and goals set  Essential hypertension Continue medication Monitor blood pressure at home; call if consistently over 130/80 Continue DASH diet.   Reminder to go to the ER if any CP, SOB, nausea, dizziness, severe HA, changes vision/speech, left arm numbness and tingling and jaw pain.  OSA (obstructive sleep apnea) Wears CPAP inconsistently, needs new equipment;  Last sleep study remote; discussed and referral placed to Dr. Brett Fairy  Hyperlipidemia associated with type 2 diabetes ([place]) LDL at goal <70 on statin  Continue low cholesterol diet and exercise.  Check lipid panel.   Hypothyroidism, unspecified type continue medications the same pending lab results reminded to take on an empty stomach 30-61mns before food.  check TSH level  Type 2 diabetes mellitus with stage 3 chronic kidney disease, without long-term current use of insulin ([facility] Education: Reviewed 'ABCs' of diabetes management (respective goals in parentheses):  A1C (<7), blood pressure (<130/80), and cholesterol (LDL <70) Eye Exam yearly and Dental Exam every 6 months. 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 ([place] Monitor Ca+, renal functions, bone density   Vitamin D deficiency Continue to recommend supplementation  for goal of 70-100 Check vitamin D level  Obesity, morbid ([place] Long discussion about weight loss, diet, and exercise Recommended diet heavy in fruits and veggies and low in animal meats, cheeses, and dairy products, appropriate calorie intake Patient will work on regular exercise program, accountibitlity partner  Discussed appropriate weight for height and initial goal (240 lb) Follow up at next visit - trying trulicity - if cost barrier cautiously consider phentermine/topamax at reduced doses with close follow up  Venous stasis, bil/ left ankle venous stasis ulcer ([place] Weight loss encouraged; hx of recurrent L ankle ulcer Recurrent ulcer today, mild erythema as well - given doxycycline course Increase lasix to BID PRN as was advised by nephrology - elevate legs TID, increase activity, increase water, decrease sodium intake.  Wear compression socks daily, apply each morning. Return to the office if no change with symptoms and in 1 month. Check labs.  Macular degeneration, unspecified laterality, unspecified type Followed by ophthalmology  Chronic gout without tophus, unspecified cause, unspecified site Continue allopurinol, colchicine per ortho Diet discussed Check uric acid   Generalized anxiety disorder Recently well controlled; uses benzo PRN prior to eye injections Stress management techniques discussed, increase water, good sleep hygiene discussed, increase exercise, and increase veggies.   Anemia, chronic, renal Renal; nephrology follows as well  Normal iron, B12   Murmur Hx of, Not heard today Aortic calcification with EF preserved per ECHO 05/2018 Monitor Weight loss encouraged  Orders Placed This Encounter  Procedures   CBC with Differential/Platelet   COMPLETE METABOLIC PANEL WITH GFR   Lipid panel   TSH   Hemoglobin A1c   Uric acid   Vitamin B12   Microalbumin / creatinine urine ratio   Urinalysis, Routine  w reflex microscopic   Ambulatory referral to  Neurology   EKG 12-Lead   HM DIABETES FOOT EXAM    Discussed med's effects and SE's. Screening labs and tests as requested with regular follow-up as recommended. Over 40 minutes of exam, counseling, chart review, and complex, high level critical decision making was performed this visit.   Future Appointments  Date Time Provider [place]  08/19/2021  8:00 AM [name], MD [facility] None  09/25/2021  9:30 AM [name], NP [facility] None  11/11/2021  9:30 AM [name], MD [facility] None  03/04/2022 10:30 AM Magda Bernheim, NP [facility] None  08/12/2022  9:00 AM [name], NP [facility] None     HPI  75 y.o. female  presents for a complete physical and follow up for has Essential hypertension; Type 2 diabetes mellitus with stage 3 chronic kidney disease, with long-term current use of insulin ([place]); Vitamin D deficiency; OSA (obstructive sleep apnea); Hypothyroidism; Medication management; Macular degeneration; Generalized anxiety disorder; Gout; Hyperlipidemia associated with type 2 diabetes mellitus ([place]); Venous stasis ulcer of left ankle limited to breakdown of skin without varicose veins ([place]); CKD stage 3 due to type 2 diabetes mellitus ([place]); Anemia of chronic disease; Diabetic retinopathy associated with type 2 diabetes mellitus ([place]); Class 3 severe obesity due to excess calories with serious comorbidity and body mass index (BMI) of 45.0 to 49.9 in adult [facility]); Hyperparathyroidism, secondary renal ([place]); and Venous stasis of both lower extremities on their problem list.   Married, 3 kids, 1 is hers, 1 grandchild; she is a retired Product manager.   she has situational anxiety, takes valium 5 mg once daily PRN, the night before she goes to get eye injections, hasn't needed in several months.    She is following with ortho Dr. Rip Harbour at [facility] for bilateral knee pain and has been advised may need TKA at some point. She will take tylenol only  if needed, uses OTC topicals and takes tumeric/bioprene supplement daily. Getting cortisone and gel shots mild mild benefit, also recently completed PT.   She has OSA on CPAP, she admits poor adherence, <50%, needs new supplies, doesn't see to be sealing well, last sleep study was remote.   BMI is Body mass index is 46.27 kg/m., she has not been working on diet, limited by knee arthritis. She does have silver sneakers but hasn't been using due to covid 19. She reports she has been successful with 90# weight loss on weight watchers but doesn't feel like it has helped as much recently, attributes to lack of exercise. She was s ozempic but was uable to affrord, $100/month copay  Wt Readings from Last 3 Encounters:  08/08/21 253 lb (114.8 kg)  03/04/21 247 lb 3.2 oz (112.1 kg)  11/08/20 248 lb (112.5 kg)   Her blood pressure has been controlled at home, today their BP is BP: 124/80 She does not workout. She denies chest pain, shortness of breath, dizziness.   She is on cholesterol medication (rosuvastatin 5 mg daily) and denies myalgias. Her LDL cholesterol is at goal. The cholesterol last visit was:   Lab Results  Component Value Date   CHOL 104 03/04/2021   HDL 34 (L) 03/04/2021   LDLCALC 54 03/04/2021   TRIG 84 03/04/2021   CHOLHDL 3.1 03/04/2021   She has not been working on diet and exercise for T2DM (off of metformin due to kidneys,  novolin 70/30 - 25 units AM, 10 units PM, now  on jardiance, ozempic was too expensive), she is on bASA, she has CKD (Dr. Posey Pronto follows on jardiance and [place], stopped losartan) and was started on [place] and denies foot ulcerations, hyperglycemia, hypoglycemia , increased appetite, nausea, paresthesia of the feet, polydipsia, polyuria, visual disturbances, vomiting and weight loss.  She reports fasting typically ranges 110-120s, rarely up to 150.  Last A1C in the office was:  Lab Results  Component Value Date   HGBA1C 5.7 (H) 03/04/2021   She is on  thyroid medication. Her medication was not changed last visit. Taking 125 mcg, taking 1 tab daily for several years without dose change.  Lab Results  Component Value Date   TSH 1.02 03/04/2021   She has CKD IIIb, She is followed by nephrology Dr. Posey Pronto and [facility], on [place] [place], [place] Last GFR: Lab Results  Component Value Date   GFRAA 44 (L) 03/04/2021   She has hyperparathyroid secondary to renal function decline, Dr. Posey Pronto is montoring:  Lab Results  Component Value Date   PTH 105 (H) 01/04/2019   CALCIUM 9.8 03/04/2021   Patient is on allopurinol 1/2 tab of 300 mg for gout and does not report a recent flare,  taking colchicine daily per ortho and has done well since.  Lab Results  Component Value Date   LABURIC 6.2 03/04/2021   Patient is on Vitamin D supplement, Dr. Posey Pronto just checked, was 89.9 on 07/15/2021, takes 10000 IU daily.   Lab Results  Component Value Date   VD25OH 80 08/06/2020     Has chronic anemia, felt likely secondary to renal disease.  CBC Latest Ref Rng & Units 03/04/2021 08/06/2020 02/27/2020  WBC 3.8 - 10.8 Thousand/uL 7.9 9.5 7.1  Hemoglobin 11.7 - 15.5 g/dL 10.7(L) 11.2(L) 10.8(L)  Hematocrit 35.0 - 45.0 % 33.1(L) 35.3 34.3(L)  Platelets 140 - 400 Thousand/uL 343 315 313   Hx of iron def; none recent and off of supplement;  Lab Results  Component Value Date   IRON 60 08/06/2020   TIBC 295 05/27/2018   FERRITIN 179 08/06/2020   She takes 1 tab 1000 mcg every other day  Lab Results  Component Value Date   VITAMINB12 >2,000 (H) 08/04/2019     Current Medications:  Current Outpatient Medications on File Prior to Visit  Medication Sig Dispense Refill   Cyanocobalamin (B-12) 1000 MCG SUBL Place 1 tablet under the tongue every other day.     allopurinol (ZYLOPRIM) 300 MG tablet Take  1/2 tablet (150 mg)  Daily  to Prevent Gout 45 tablet 2   aspirin EC 81 MG tablet Take 81 mg by mouth daily.     atenolol (TENORMIN) 100 MG tablet Take 1  tablet by mouth once daily for blood pressure 90 tablet 0   BLACK PEPPER-TURMERIC PO Take by mouth daily.     Blood Glucose Monitoring Suppl (ONE TOUCH ULTRA 2) w/Device KIT check blood sugar 3 times daily-DX-E11.21 1 kit 0   Cholecalciferol (VITAMIN D3) 5000 units CAPS Take 10,000 Units by mouth daily.     colchicine 0.6 MG tablet TAKE 1 TABLET BY MOUTH DAILY FOR GOUT PREVENTION 90 tablet 3   empagliflozin (JARDIANCE) 10 MG TABS tablet Take 1 tablet (10 mg total) by mouth daily before breakfast. 30 tablet 3   Finerenone (KERENDIA) 10 MG TABS Take 10 mg by mouth daily. Take  1 tablet  Daily  for Diabetic Kidneys 90 tablet 0   furosemide (LASIX) 80 MG tablet Take 1 tablet 1-2 x /  day as needed for Fluid Retention / Ankle Swelling 180 tablet 3   hyoscyamine (LEVSIN, ANASPAZ) 0.125 MG tablet Take 1 tablet (0.125 mg total) by mouth every 6 (six) hours as needed. 60 tablet 1   insulin NPH-regular Human (70-30) 100 UNIT/ML injection Patient injects 25 units in the AM and 10 units in the PM. Reduce each insulin dose by 2 units if glucose persistently <100 10 mL    Lancets (ONETOUCH ULTRASOFT) lancets Use as instructed Dx. Code: E11.22 100 each 12   Lancets (ONETOUCH ULTRASOFT) lancets Check blood sugar 3 times daily-DX-E10.9 300 each 1   levothyroxine (SYNTHROID) 125 MCG tablet TAKE 1 TABLET DAILY ON AN EMPTY STOMACH WITH ONLY WATER FO 30 MINUTES AND NO ANTACID MEDS, CALCIUM OR MAGNESIUM FOR 4 HOURS AND AVOID BIOTIN 90 tablet 3   ONETOUCH ULTRA test strip CHECK BLOOD SUGAR 3 TIMES A DAY 300 strip 3   prednisoLONE acetate (PRED FORTE) 1 % ophthalmic suspension INSTILL 1 DROP INTO EACH EYE TWICE A DAY 5 mL 0   rosuvastatin (CRESTOR) 5 MG tablet Take 1 tablet Daily for Cholesterol 90 tablet 3   No current facility-administered medications on file prior to visit.   Allergies:  Allergies  Allergen Reactions   Penicillins Swelling   Sulfa Antibiotics Itching and Rash   Medical History:  She has  Essential hypertension; Type 2 diabetes mellitus with stage 3 chronic kidney disease, with long-term current use of insulin ([place]); Vitamin D deficiency; OSA (obstructive sleep apnea); Hypothyroidism; Medication management; Macular degeneration; Generalized anxiety disorder; Gout; Hyperlipidemia associated with type 2 diabetes mellitus ([place]); Venous stasis ulcer of left ankle limited to breakdown of skin without varicose veins ([place]); CKD stage 3 due to type 2 diabetes mellitus ([place]); Anemia of chronic disease; Diabetic retinopathy associated with type 2 diabetes mellitus ([place]); Class 3 severe obesity due to excess calories with serious comorbidity and body mass index (BMI) of 45.0 to 49.9 in adult [facility]); Hyperparathyroidism, secondary renal ([place]); and Venous stasis of both lower extremities on their problem list.  Health Maintenance:   Immunization History  Administered Date(s) Administered   DT (Pediatric) 06/19/2015   DTaP 12/09/2004   PFIZER Comirnaty(Gray Top)Covid-19 Tri-Sucrose Vaccine 04/18/2021   PFIZER(Purple Top)SARS-COV-2 Vaccination 02/03/2020, 02/28/2020, 10/09/2020   Pneumococcal Conjugate-13 12/19/2014   Pneumococcal Polysaccharide-23 03/02/2009, 01/17/2019   Last colonoscopy: 03/2014 normal Last mammogram: 11/2020 Last pap smear/pelvic exam: 2012, DONE DEXA: 11/2015 normal, declines for now  Carotid US 2013 CXR 2009  ECHO: 06/29/2018 - Aortic valve: Moderate focal calcification involving the left   coronary cusp. EF 53-97%, grade 1 diastolic dysfunction  Prior vaccinations: TD or Tdap: 2016      Influenza: declines Pneumococcal: 2010, 2019 Prevnar 13: 2015 Shingles/Zostavax: declines Covid 19: 2/2, 2021, pfizer, last booster 10/09/2020  Eye exam Dr. Zigmund Daniel, retinopathy, follows q6w for injections, report requested  Dental: Dr. Delma Freeze, last exam 06/2021, has new partial  Foot exam: 08/08/2021 - today   Patient Care Team: [name], MD as PCP - General (Internal  Medicine) Warden Fillers, MD as Consulting Physician (Optometry) [name], MD as Consulting Physician (Ophthalmology) Inda Castle, MD (Inactive) as Consulting Physician (Gastroenterology) Druscilla Brownie, MD as Consulting Physician (Dermatology) Allyn Kenner, MD (Dermatology)  Surgical History:  She has a past surgical history that includes  eye procedure; Tubal ligation; and Dental examination under anesthesia. Family History:  Herfamily history includes Breast cancer in her mother; Diabetes in her maternal grandmother and sister; Hypertension in her sister; Renal  Disease in her mother. Social History:  She reports that she has never smoked. She has never used smokeless tobacco. She reports that she does not currently use alcohol. She reports that she does not use drugs.  Review of Systems: Review of Systems  Constitutional:  Negative for malaise/fatigue and weight loss.  HENT:  Negative for hearing loss and tinnitus.   Eyes:  Negative for blurred vision and double vision.  Respiratory:  Negative for cough, sputum production, shortness of breath and wheezing.   Cardiovascular:  Positive for leg swelling. Negative for chest pain, palpitations, orthopnea, claudication and PND.  Gastrointestinal:  Negative for abdominal pain, blood in stool, constipation, diarrhea, heartburn, melena, nausea and vomiting.  Genitourinary: Negative.   Musculoskeletal:  Positive for joint pain (bil knees). Negative for falls and myalgias.  Skin:  Negative for rash.  Neurological:  Negative for dizziness, tingling, sensory change, weakness and headaches.  Endo/Heme/Allergies:  Negative for polydipsia.  Psychiatric/Behavioral: Negative.  Negative for depression, memory loss, substance abuse and suicidal ideas. The patient is not nervous/anxious and does not have insomnia.   All other systems reviewed and are negative.  Physical Exam: Estimated body mass index is 46.27 kg/m as calculated from  the following:   Height as of this encounter: _0  (1.575 m).   Weight as of this encounter: 253 lb (114.8 kg). BP 124/80   Pulse 82   Temp (!) 97.3 F (36.3 C)   Ht _1  (1.575 m)   Wt 253 lb (114.8 kg)   SpO2 99%   BMI 46.27 kg/m  General Appearance: Well nourished, well dressed, morbidly obese AA elder female in no apparent distress.  Eyes: PERRLA, EOMs, conjunctiva no swelling or erythema Sinuses: No Frontal/maxillary tenderness  ENT/Mouth: Ext aud canals clear, normal light reflex with TMs without erythema, bulging. Fair dentition; partials. No erythema, swelling, or exudate on post pharynx. Tonsils not swollen or erythematous. Hearing normal.  Neck: Supple, thyroid normal. No bruits  Respiratory: Respiratory effort normal, BS equal bilaterally without rales, rhonchi, wheezing or stridor.  Cardio: RRR withou, rubs or gallops, no audible murmur. Symmetrical peripheral pulses with edema, left ankle with weeping, blisters, dark discoloration, mild local erythema Chest: symmetric, with normal excursions and percussion.  Breasts: Mammogram UTD, denies concerns Lymphatics: Non tender without lymphadenopathy.  Genitourinary: Defer, no concerns Musculoskeletal: Exam limited by body habitus Full ROM all peripheral extremities,5/5 strength, and antalgic gait.  Skin: Warm, dry without rashes, lesions, ecchymosis. Neuro: Cranial nerves intact, reflexes equal bilaterally. Normal muscle tone, no cerebellar symptoms. Sensation intact bil feel monofilament Psych: Awake and oriented X 3, normal affect, Insight and Judgment appropriate.   EKG: No ST changes, PAC, left ventricular hypertrophy by voltage unchanged from previous  No hypertrophy per ECHO 06/29/2018  Jocelyn Camacho 12:29 PM [facility]

## 2021-08-08 ENCOUNTER — Ambulatory Visit (INDEPENDENT_AMBULATORY_CARE_PROVIDER_SITE_OTHER): Payer: Medicare Other | Admitting: Adult Health

## 2021-08-08 ENCOUNTER — Encounter: Payer: Self-pay | Admitting: Adult Health

## 2021-08-08 ENCOUNTER — Other Ambulatory Visit: Payer: Self-pay

## 2021-08-08 VITALS — BP 124/80 | HR 82 | Temp 97.3°F | Ht 62.0 in | Wt 253.0 lb

## 2021-08-08 DIAGNOSIS — M1A9XX0 Chronic gout, unspecified, without tophus (tophi): Secondary | ICD-10-CM

## 2021-08-08 DIAGNOSIS — N1832 Chronic kidney disease, stage 3b: Secondary | ICD-10-CM

## 2021-08-08 DIAGNOSIS — E1169 Type 2 diabetes mellitus with other specified complication: Secondary | ICD-10-CM

## 2021-08-08 DIAGNOSIS — D638 Anemia in other chronic diseases classified elsewhere: Secondary | ICD-10-CM

## 2021-08-08 DIAGNOSIS — Z0000 Encounter for general adult medical examination without abnormal findings: Secondary | ICD-10-CM | POA: Diagnosis not present

## 2021-08-08 DIAGNOSIS — E559 Vitamin D deficiency, unspecified: Secondary | ICD-10-CM

## 2021-08-08 DIAGNOSIS — Z79899 Other long term (current) drug therapy: Secondary | ICD-10-CM

## 2021-08-08 DIAGNOSIS — E538 Deficiency of other specified B group vitamins: Secondary | ICD-10-CM

## 2021-08-08 DIAGNOSIS — H3530 Unspecified macular degeneration: Secondary | ICD-10-CM

## 2021-08-08 DIAGNOSIS — I878 Other specified disorders of veins: Secondary | ICD-10-CM

## 2021-08-08 DIAGNOSIS — L97321 Non-pressure chronic ulcer of left ankle limited to breakdown of skin: Secondary | ICD-10-CM

## 2021-08-08 DIAGNOSIS — Z6841 Body Mass Index (BMI) 40.0 and over, adult: Secondary | ICD-10-CM

## 2021-08-08 DIAGNOSIS — I872 Venous insufficiency (chronic) (peripheral): Secondary | ICD-10-CM

## 2021-08-08 DIAGNOSIS — E039 Hypothyroidism, unspecified: Secondary | ICD-10-CM

## 2021-08-08 DIAGNOSIS — Z0001 Encounter for general adult medical examination with abnormal findings: Secondary | ICD-10-CM

## 2021-08-08 DIAGNOSIS — E11319 Type 2 diabetes mellitus with unspecified diabetic retinopathy without macular edema: Secondary | ICD-10-CM

## 2021-08-08 DIAGNOSIS — E1122 Type 2 diabetes mellitus with diabetic chronic kidney disease: Secondary | ICD-10-CM

## 2021-08-08 DIAGNOSIS — Z794 Long term (current) use of insulin: Secondary | ICD-10-CM

## 2021-08-08 DIAGNOSIS — G4733 Obstructive sleep apnea (adult) (pediatric): Secondary | ICD-10-CM

## 2021-08-08 DIAGNOSIS — F411 Generalized anxiety disorder: Secondary | ICD-10-CM

## 2021-08-08 DIAGNOSIS — I10 Essential (primary) hypertension: Secondary | ICD-10-CM

## 2021-08-08 DIAGNOSIS — N2581 Secondary hyperparathyroidism of renal origin: Secondary | ICD-10-CM

## 2021-08-08 DIAGNOSIS — N183 Chronic kidney disease, stage 3 unspecified: Secondary | ICD-10-CM

## 2021-08-08 MED ORDER — TRULICITY 1.5 MG/0.5ML ~~LOC~~ SOAJ: 1.5000 mg | SUBCUTANEOUS | 1 refills | Status: DC

## 2021-08-08 MED ORDER — DOXYCYCLINE HYCLATE 100 MG PO CAPS: ORAL_CAPSULE | ORAL | 0 refills | Status: DC

## 2021-08-08 NOTE — Patient Instructions (Addendum)
Please look into water aerobics - check with [facility] or [facility] center    Please keep a food log, and track sugars and how much insulin you are taking to bring to the next visit  Start trulicity, once a week, check cost with pharmacy  Taper down on insulin 2-4 units as needed for any sugars <100     Dulaglutide Injection What is this medication? DULAGLUTIDE (DOO la GLOO tide) controls blood sugar in people with type 2 diabetes. It is used with lifestyle changes like diet and exercise. It may lower the risk of problems that need treatment in the hospital. These problemsinclude heart attack or stroke. This medicine may be used for other purposes; ask your health care provider orpharmacist if you have questions. COMMON BRAND NAME(S): Trulicity What should I tell my care team before I take this medication? They need to know if you have any of these conditions: endocrine tumors (MEN 2) or if someone in your family had these tumors eye disease, vision problems history of pancreatitis kidney disease liver disease stomach or intestine problems thyroid cancer or if someone in your family had thyroid cancer an unusual or allergic reaction to dulaglutide, other medicines, foods, dyes, or preservatives pregnant or trying to get pregnant breast-feeding How should I use this medication? This medicine is injected under the skin. You will be taught how to prepare and give it. Take it as directed on the prescription label on the same day of each week. Do NOT prime the pen. Keep taking it unless your health care providertells you to stop. If you use this medicine with insulin, you should inject this medicine and the insulin separately. Do not mix them together. Do not give the injections rightnext to each other. Change (rotate) injection sites with each injection. This drug comes with INSTRUCTIONS FOR USE. Ask your pharmacist for directions on how to use this medicine. Read the information carefully.  Talk to yourpharmacist or health care provider if you have questions. It is important that you put your used needles and syringes in a special sharps container. Do not put them in a trash can. If you do not have a sharpscontainer, call your pharmacist or health care provider to get one. A special MedGuide will be given to you by the pharmacist with eachprescription and refill. Be sure to read this information carefully each time. Talk to your health care provider about the use of this medicine in children.Special care may be needed. Overdosage: If you think you have taken too much of this medicine contact apoison control center or emergency room at once. NOTE: This medicine is only for you. Do not share this medicine with others. What if I miss a dose? If you miss a dose, take it as soon as you can unless it is more than 3 days late. If it is more than 3 days late, skip the missed dose. Take the next doseat the normal time. What may interact with this medication? other medicines for diabetes Many medications may cause changes in blood sugar, these include: alcohol containing beverages antiviral medicines for HIV or AIDS aspirin and aspirin-like drugs certain medicines for blood pressure, heart disease, irregular heart beat chromium diuretics female hormones, such as estrogens or progestins, birth control pills fenofibrate gemfibrozil isoniazid lanreotide female hormones or anabolic steroids MAOIs like Carbex, Eldepryl, Marplan, Nardil, and Parnate medicines for allergies, asthma, cold, or cough medicines for depression, anxiety, or psychotic disturbances medicines for weight loss niacin nicotine NSAIDs, medicines for  pain and inflammation, like ibuprofen or naproxen octreotide pasireotide pentamidine phenytoin probenecid quinolone antibiotics such as ciprofloxacin, levofloxacin, ofloxacin some herbal dietary supplements steroid medicines such as prednisone or  cortisone sulfamethoxazole; trimethoprim thyroid hormones Some medications can hide the warning symptoms of low blood sugar (hypoglycemia). You may need to monitor your blood sugar more closely if youare taking one of these medications. These include: beta-blockers, often used for high blood pressure or heart problems (examples include atenolol, metoprolol, propranolol) clonidine guanethidine reserpine This list may not describe all possible interactions. Give your health care provider a list of all the medicines, herbs, non-prescription drugs, or dietary supplements you use. Also tell them if you smoke, drink alcohol, or use illegaldrugs. Some items may interact with your medicine. What should I watch for while using this medication? Visit your health care provider for regular checks on your progress. Check with your health care provider if you have severe diarrhea, nausea, and vomiting, or if you sweat a lot. The loss of too much body fluid may make itdangerous for you to take this medicine. A test called the HbA1C (A1C) will be monitored. This is a simple blood test. It measures your blood sugar control over the last 2 to 3 months. You willreceive this test every 3 to 6 months. Learn how to check your blood sugar. Learn the symptoms of low and high bloodsugar and how to manage them. Always carry a quick-source of sugar with you in case you have symptoms of low blood sugar. Examples include hard sugar candy or glucose tablets. Make sure others know that you can choke if you eat or drink when you develop serious symptoms of low blood sugar, such as seizures or unconsciousness. Get medicalhelp at once. Tell your health care provider if you have high blood sugar. You might need to change the dose of your medicine. If you are sick or exercising more thanusual, you may need to change the dose of your medicine. Do not skip meals. Ask your health care provider if you should avoid alcohol. Many  nonprescription cough and cold products contain sugar or alcohol. Thesecan affect blood sugar. Pens should never be shared. Even if the needle is changed, sharing may resultin passing of viruses like hepatitis or HIV. Wear a medical ID bracelet or chain. Carry a card that describes yourcondition. List the medicines and doses you take on the card. What side effects may I notice from receiving this medication? Side effects that you should report to your doctor or health care professionalas soon as possible: allergic reactions (skin rash, itching or hives; swelling of the face, lips, or tongue) changes in vision diarrhea that continues or is severe infection (fever, chills, cough, sore throat, pain or trouble passing urine) kidney injury (trouble passing urine or change in the amount of urine) low blood sugar (feeling anxious; confusion; dizziness; increased hunger; unusually weak or tired; increased sweating; shakiness; cold, clammy skin; irritable; headache; blurred vision; fast heartbeat; loss of consciousness) lump or swelling on the neck trouble breathing trouble swallowing unusual stomach upset or pain vomiting Side effects that usually do not require medical attention (report to yourdoctor or health care professional if they continue or are bothersome): lack or loss of appetite nausea pain, redness, or irritation at site where injected This list may not describe all possible side effects. Call your doctor for medical advice about side effects. You may report side effects to FDA at1-800-FDA-1088. Where should I keep my medication? Keep out of the reach of  children and pets. Refrigeration (preferred): Store unopened pens in a refrigerator between 2 and 8 degrees C (36 and 46 degrees F). Keep it in the original carton until you are ready to take it. Do not freeze or use if the medicine has been frozen. Protect from light. Get rid of any unused medicine after the expiration date on  thelabel. Room Temperature: The pen may be stored at room temperature below 30 degrees C (86 degrees F) for up to a total of 14 days if needed. Protect from light. Avoid exposure to extreme heat. If it is stored at room temperature, throw awayany unused medicine after 14 days or after it expires, whichever is first. To get rid of medicines that are no longer needed or have expired: Take the medicine to a medicine take-back program. Check with your pharmacy or law enforcement to find a location. If you cannot return the medicine, ask your pharmacist or health care provider how to get rid of this medicine safely. NOTE: This sheet is a summary. It may not cover all possible information. If you have questions about this medicine, talk to your doctor, pharmacist, orhealth care provider.  2022 Elsevier/Gold Standard (2020-10-08 07:35:51)

## 2021-08-09 LAB — COMPLETE METABOLIC PANEL WITH GFR
AG Ratio: 1.5 (calc) (ref 1.0–2.5)
ALT: 14 U/L (ref 6–29)
AST: 16 U/L (ref 10–35)
Albumin: 4.2 g/dL (ref 3.6–5.1)
Alkaline phosphatase (APISO): 83 U/L (ref 37–153)
BUN/Creatinine Ratio: 25 (calc) — ABNORMAL HIGH (ref 6–22)
BUN: 42 mg/dL — ABNORMAL HIGH (ref 7–25)
CO2: 30 mmol/L (ref 20–32)
Calcium: 9.7 mg/dL (ref 8.6–10.4)
Chloride: 105 mmol/L (ref 98–110)
Creat: 1.68 mg/dL — ABNORMAL HIGH (ref 0.60–1.00)
Globulin: 2.8 g/dL (calc) (ref 1.9–3.7)
Glucose, Bld: 102 mg/dL — ABNORMAL HIGH (ref 65–99)
Potassium: 4.1 mmol/L (ref 3.5–5.3)
Sodium: 144 mmol/L (ref 135–146)
Total Bilirubin: 0.5 mg/dL (ref 0.2–1.2)
Total Protein: 7 g/dL (ref 6.1–8.1)
eGFR: 32 mL/min/{1.73_m2} — ABNORMAL LOW (ref >=60)

## 2021-08-09 LAB — URINALYSIS, ROUTINE W REFLEX MICROSCOPIC
Bilirubin Urine: NEGATIVE
Glucose, UA: NEGATIVE
Hgb urine dipstick: NEGATIVE
Ketones, ur: NEGATIVE
Nitrite: NEGATIVE
Protein, ur: NEGATIVE
RBC / HPF: NONE SEEN /HPF (ref 0–2)
Specific Gravity, Urine: 1.016 (ref 1.001–1.035)
pH: 5.5 (ref 5.0–8.0)

## 2021-08-09 LAB — CBC WITH DIFFERENTIAL/PLATELET
Absolute Monocytes: 600 cells/uL (ref 200–950)
Basophils Absolute: 56 cells/uL (ref 0–200)
Basophils Relative: 0.7 %
Eosinophils Absolute: 152 cells/uL (ref 15–500)
Eosinophils Relative: 1.9 %
HCT: 34 % — ABNORMAL LOW (ref 35.0–45.0)
Hemoglobin: 10.8 g/dL — ABNORMAL LOW (ref 11.7–15.5)
Lymphs Abs: 2328 cells/uL (ref 850–3900)
MCH: 28.3 pg (ref 27.0–33.0)
MCHC: 31.8 g/dL — ABNORMAL LOW (ref 32.0–36.0)
MCV: 89.2 fL (ref 80.0–100.0)
MPV: 12.4 fL (ref 7.5–12.5)
Monocytes Relative: 7.5 %
Neutro Abs: 4864 cells/uL (ref 1500–7800)
Neutrophils Relative %: 60.8 %
Platelets: 296 10*3/uL (ref 140–400)
RBC: 3.81 10*6/uL (ref 3.80–5.10)
RDW: 14.5 % (ref 11.0–15.0)
Total Lymphocyte: 29.1 %
WBC: 8 10*3/uL (ref 3.8–10.8)

## 2021-08-09 LAB — TSH: TSH: 3.87 mIU/L (ref 0.40–4.50)

## 2021-08-09 LAB — MICROALBUMIN / CREATININE URINE RATIO
Creatinine, Urine: 118 mg/dL (ref 20–275)
Microalb Creat Ratio: 4 mcg/mg creat (ref <30)
Microalb, Ur: 0.5 mg/dL

## 2021-08-09 LAB — HEMOGLOBIN A1C
Hgb A1c MFr Bld: 5.8 % of total Hgb — ABNORMAL HIGH (ref <5.7)
Mean Plasma Glucose: 120 mg/dL
eAG (mmol/L): 6.6 mmol/L

## 2021-08-09 LAB — LIPID PANEL
Cholesterol: 126 mg/dL (ref <200)
HDL: 38 mg/dL — ABNORMAL LOW (ref >=50)
LDL Cholesterol (Calc): 69 mg/dL (calc)
Non-HDL Cholesterol (Calc): 88 mg/dL (calc) (ref <130)
Total CHOL/HDL Ratio: 3.3 (calc) (ref <5.0)
Triglycerides: 108 mg/dL (ref <150)

## 2021-08-09 LAB — MICROSCOPIC MESSAGE

## 2021-08-09 LAB — VITAMIN B12: Vitamin B-12: 1548 pg/mL — ABNORMAL HIGH (ref 200–1100)

## 2021-08-09 LAB — URIC ACID: Uric Acid, Serum: 6.6 mg/dL (ref 2.5–7.0)

## 2021-08-12 ENCOUNTER — Other Ambulatory Visit: Payer: Self-pay | Admitting: Adult Health

## 2021-08-12 MED ORDER — ROSUVASTATIN CALCIUM 5 MG PO TABS: ORAL_TABLET | ORAL | 3 refills | Status: DC

## 2021-08-19 ENCOUNTER — Encounter (INDEPENDENT_AMBULATORY_CARE_PROVIDER_SITE_OTHER): Payer: Medicare Other | Admitting: Ophthalmology

## 2021-08-19 ENCOUNTER — Other Ambulatory Visit: Payer: Self-pay

## 2021-08-19 DIAGNOSIS — H43813 Vitreous degeneration, bilateral: Secondary | ICD-10-CM | POA: Diagnosis not present

## 2021-08-19 DIAGNOSIS — H34812 Central retinal vein occlusion, left eye, with macular edema: Secondary | ICD-10-CM | POA: Diagnosis not present

## 2021-08-19 DIAGNOSIS — I10 Essential (primary) hypertension: Secondary | ICD-10-CM

## 2021-08-19 DIAGNOSIS — H35033 Hypertensive retinopathy, bilateral: Secondary | ICD-10-CM | POA: Diagnosis not present

## 2021-09-19 LAB — HM DIABETES EYE EXAM

## 2021-09-23 ENCOUNTER — Encounter (INDEPENDENT_AMBULATORY_CARE_PROVIDER_SITE_OTHER): Payer: Medicare Other | Admitting: Ophthalmology

## 2021-09-23 ENCOUNTER — Other Ambulatory Visit: Payer: Self-pay

## 2021-09-23 DIAGNOSIS — H34812 Central retinal vein occlusion, left eye, with macular edema: Secondary | ICD-10-CM | POA: Diagnosis not present

## 2021-09-23 DIAGNOSIS — H35033 Hypertensive retinopathy, bilateral: Secondary | ICD-10-CM | POA: Diagnosis not present

## 2021-09-23 DIAGNOSIS — I10 Essential (primary) hypertension: Secondary | ICD-10-CM | POA: Diagnosis not present

## 2021-09-23 DIAGNOSIS — H43813 Vitreous degeneration, bilateral: Secondary | ICD-10-CM | POA: Diagnosis not present

## 2021-09-24 NOTE — Progress Notes (Addendum)
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 Has tapered Insulin doses by 2 units in the am to 22 u & still at ~ 10 units qpm. She denies any hypoglycemic episodes.   Medications    Dulaglutide (TRULICITY) 1.5 EN/4.0HW SOPN, Inject 1.5 mg into the skin once a week.   empagliflozin (JARDIANCE) 10 MG TABS tablet, Take 1 tablet (10 mg total) by mouth daily before breakfast.   Finerenone (KERENDIA) 10 MG TABS, Take 10 mg by mouth daily. Take  1 tablet  Daily  for Diabetic Kidneys   insulin NPH-regular Human (70-30) 100 UNIT/ML injection, Patient injects 25 units in the AM and 10 units in the PM. Reduce each insulin dose by 2 units if glucose persistently <100   levothyroxine (125 MCG tablet, TAKE 1 TABLET DAILY    atenolol (TENORMIN) 100 MG tablet, Take 1 tablet by mouth once daily for blood pressure   furosemide (LASIX) 80 MG tablet, Take 1 tablet 1-2 x /day as needed for Fluid Retention / Ankle Swelling   rosuvastatin (CRESTOR) 5 MG tablet, Take 1 tablet once weekly as directed for Cholesterol LDL goal less than 70.   allopurinol (ZYLOPRIM) 300 MG tablet, Take  1/2 tablet (150 mg)  Daily  to Prevent Gout   aspirin EC 81 MG tablet, Take 81 mg by mouth daily.   colchicine 0.6 MG tablet, TAKE 1 TABLET BY MOUTH DAILY FOR GOUT PREVENTION   Cyanocobalamin (B-12) 1000 MCG SUBL, Place 1 tablet under the tongue  every other day.   BLACK PEPPER-TURMERIC PO, Take by mouth daily.   Blood Glucose Monitoring Suppl (ONE TOUCH ULTRA 2) w/Device KIT, check blood sugar 3 times daily-[id number]   Cholecalciferol (VITAMIN D3) 5000 units CAPS, Take 10,000 Units by mouth daily.   doxycycline (VIBRAMYCIN) 100 MG capsule, Take 1 capsule 2 x/day with food for 10 days.   hyoscyamine (LEVSIN, ANASPAZ) 0.125 MG tablet, Take 1 tablet (0.125 mg total) by mouth every 6 (six) hours as needed.   prednisoLONE acetate (PRED FORTE) 1 % ophthalmic suspension, INSTILL 1 DROP INTO EACH EYE TWICE A DAY  Problem list She has Essential hypertension; Type 2 diabetes mellitus with stage 3 chronic kidney disease, with long-term current use of insulin ([place]); Vitamin D deficiency; OSA (obstructive sleep apnea); Hypothyroidism; Medication management; Macular degeneration; Generalized anxiety disorder; Gout; Hyperlipidemia associated with type 2 diabetes mellitus ([place]); Venous stasis ulcer of left ankle limited to breakdown of skin without varicose veins ([place]); CKD stage 3 due to type 2 diabetes mellitus ([place]); Anemia of chronic disease; Diabetic retinopathy associated with type 2 diabetes mellitus ([place]); Class 3 severe obesity due to excess calories with serious comorbidity and body mass index (BMI) of 45.0 to 49.9 in adult [facility]); Hyperparathyroidism, secondary renal ([place]); and Venous stasis of both lower extremities on their problem list.  Observations/Objective:  BP 128/72   Pulse 94   Temp 97.6 F (36.4 C)   Resp 17   Ht _0  (1.575 m)   Wt 250 lb 6.4 oz (113.6 kg)   SpO2 99%   BMI 45.80 kg/m   HEENT - WNL. Neck - supple.  Chest - Clear equal BS. Cor - Nl HS. RRR w/o sig MGR. PP 1(+). No edema. MS- FROM w/o deformities.  Gait Nl. Neuro -  Nl w/o focal abnormalities. Skin -  Stasis ulcer of lateral left ankle appears well healed .   Assessment and Plan:  1. Essential hypertension   2. Hyperlipidemia associated with type 2  diabetes mellitus ([place])   3. Type 2 diabetes mellitus with stage 3b chronic kidney  disease, with long-term current use of insulin ([facility])  - Continue to encourage better diet & weight loss.    Follow Up Instructions:      I discussed the assessment and treatment plan with the patient. The patient was provided an opportunity to ask questions and all were answered. The patient agreed with the plan and demonstrated an understanding of the instructions.       The patient was advised to call back or seek an in-person evaluation if the symptoms worsen or if the condition fails to improve as anticipated.   [name], MD

## 2021-09-25 ENCOUNTER — Other Ambulatory Visit: Payer: Self-pay

## 2021-09-25 ENCOUNTER — Ambulatory Visit (INDEPENDENT_AMBULATORY_CARE_PROVIDER_SITE_OTHER): Payer: Medicare Other | Admitting: Internal Medicine

## 2021-09-25 ENCOUNTER — Encounter: Payer: Self-pay | Admitting: Internal Medicine

## 2021-09-25 VITALS — BP 128/72 | HR 94 | Temp 97.6°F | Resp 17 | Ht 62.0 in | Wt 250.4 lb

## 2021-09-25 DIAGNOSIS — E1169 Type 2 diabetes mellitus with other specified complication: Secondary | ICD-10-CM | POA: Diagnosis not present

## 2021-09-25 DIAGNOSIS — I10 Essential (primary) hypertension: Secondary | ICD-10-CM | POA: Diagnosis not present

## 2021-09-25 DIAGNOSIS — Z794 Long term (current) use of insulin: Secondary | ICD-10-CM

## 2021-09-25 DIAGNOSIS — N1832 Chronic kidney disease, stage 3b: Secondary | ICD-10-CM

## 2021-09-25 DIAGNOSIS — E785 Hyperlipidemia, unspecified: Secondary | ICD-10-CM | POA: Diagnosis not present

## 2021-09-25 DIAGNOSIS — E1122 Type 2 diabetes mellitus with diabetic chronic kidney disease: Secondary | ICD-10-CM

## 2021-10-15 ENCOUNTER — Other Ambulatory Visit: Payer: Self-pay | Admitting: Adult Health

## 2021-10-15 DIAGNOSIS — I872 Venous insufficiency (chronic) (peripheral): Secondary | ICD-10-CM

## 2021-10-24 ENCOUNTER — Ambulatory Visit (INDEPENDENT_AMBULATORY_CARE_PROVIDER_SITE_OTHER): Payer: Medicare Other | Admitting: Neurology

## 2021-10-24 ENCOUNTER — Other Ambulatory Visit: Payer: Self-pay

## 2021-10-24 ENCOUNTER — Encounter: Payer: Self-pay | Admitting: Neurology

## 2021-10-24 VITALS — BP 143/85 | HR 87 | Ht 62.0 in | Wt 250.0 lb

## 2021-10-24 DIAGNOSIS — D638 Anemia in other chronic diseases classified elsewhere: Secondary | ICD-10-CM

## 2021-10-24 DIAGNOSIS — M25471 Effusion, right ankle: Secondary | ICD-10-CM

## 2021-10-24 DIAGNOSIS — Z794 Long term (current) use of insulin: Secondary | ICD-10-CM

## 2021-10-24 DIAGNOSIS — H3530 Unspecified macular degeneration: Secondary | ICD-10-CM

## 2021-10-24 DIAGNOSIS — E1122 Type 2 diabetes mellitus with diabetic chronic kidney disease: Secondary | ICD-10-CM

## 2021-10-24 DIAGNOSIS — N1832 Chronic kidney disease, stage 3b: Secondary | ICD-10-CM

## 2021-10-24 DIAGNOSIS — R0683 Snoring: Secondary | ICD-10-CM | POA: Diagnosis not present

## 2021-10-24 DIAGNOSIS — Z72821 Inadequate sleep hygiene: Secondary | ICD-10-CM

## 2021-10-24 DIAGNOSIS — M25472 Effusion, left ankle: Secondary | ICD-10-CM

## 2021-10-24 DIAGNOSIS — Z6841 Body Mass Index (BMI) 40.0 and over, adult: Secondary | ICD-10-CM

## 2021-10-24 DIAGNOSIS — R0601 Orthopnea: Secondary | ICD-10-CM | POA: Diagnosis not present

## 2021-10-24 MED ORDER — ALPRAZOLAM 0.5 MG PO TABS: 0.5000 mg | ORAL_TABLET | Freq: Every evening | ORAL | 0 refills | Status: DC | PRN

## 2021-10-24 NOTE — Addendum Note (Signed)
Addended by: Larey Seat on: 10/24/2021 09:49 AM   Modules accepted: Orders

## 2021-10-24 NOTE — Patient Instructions (Signed)
Screening for Sleep Apnea Sleep apnea is a condition in which breathing pauses or becomes shallow during sleep. Sleep apnea screening is a test to determine if you are at risk for sleep apnea. The test includes a series of questions. It will only takes a few minutes. Your health care provider may ask you to have this test in preparation for surgery or as part of a physical exam. What are the symptoms of sleep apnea? Common symptoms of sleep apnea include: Snoring. Waking up often at night. Daytime sleepiness. Pauses in breathing. Choking or gasping during sleep. Irritability. Forgetfulness. Trouble thinking clearly. Depression. Personality changes. Most people with sleep apnea do not know that they have it. What are the advantages of sleep apnea screening? Getting screened for sleep apnea can help: Ensure your safety. It is important for your health care providers to know whether or not you have sleep apnea, especially if you are having surgery or have other long-term (chronic) health conditions. Improve your health and allow you to get a better night's rest. Restful sleep can help you: Have more energy. Lose weight. Improve high blood pressure. Improve diabetes management. Prevent stroke. Prevent car accidents. What happens during the screening? Screening usually includes being asked a list of questions about your sleep quality. Some questions you may be asked include: Do you snore? Is your sleep restless? Do you have daytime sleepiness? Has a partner or spouse told you that you stop breathing during sleep? Have you had trouble concentrating or memory loss? What is your age? What is your neck circumference? To measure your neck, keep your back straight and gently wrap the tape measure around your neck. Put the tape measure at the middle of your neck, between your chin and collarbone. What is your sex assigned at birth? Do you have or are you being treated for high blood  pressure? If your screening test is positive, you are at risk for the condition. Further testing may be needed to confirm a diagnosis of sleep apnea. Where to find more information You can find screening tools online or at your health care clinic. For more information about sleep apnea screening and healthy sleep, visit these websites: Centers for Disease Control and Prevention: http://www.wolf.info/ American Sleep Apnea Association: www.sleepapnea.org Contact a health care provider if: You think that you may have sleep apnea. Summary Sleep apnea screening can help determine if you are at risk for sleep apnea. It is important for your health care providers to know whether or not you have sleep apnea, especially if you are having surgery or have other chronic health conditions. You may be asked to take a screening test for sleep apnea in preparation for surgery or as part of a physical exam. This information is not intended to replace advice given to you by your health care provider. Make sure you discuss any questions you have with your health care provider. Document Revised: 11/16/2020 Document Reviewed: 11/16/2020 Elsevier Patient Education  2022 [place] Sleep Information, Adult Quality sleep is important for your mental and physical health. It also improves your quality of life. Quality sleep means you: Are asleep for most of the time you are in bed. Fall asleep within 30 minutes. Wake up no more than once a night.  Are awake for no longer than 20 minutes if you do wake up during the night. Most adults need 7-8 hours of quality sleep each night. How can poor sleep affect me? If you do not get enough quality sleep,  you may have: Mood swings. Daytime sleepiness. Confusion. Decreased reaction time. Sleep disorders, such as insomnia and sleep apnea. Difficulty with: Solving problems. Coping with stress. Paying attention. These issues may affect your performance and productivity  at work, school, and at home. Lack of sleep may also put you at higher risk for accidents, suicide, and risky behaviors. If you do not get quality sleep you may also be at higher risk for several health problems, including: Infections. Type 2 diabetes. Heart disease. High blood pressure. Obesity. Worsening of long-term conditions, like arthritis, kidney disease, depression, Parkinson's disease, and epilepsy. What actions can I take to get more quality sleep?   Stick to a sleep schedule. Go to sleep and wake up at about the same time each day. Do not try to sleep less on weekdays and make up for lost sleep on weekends. This does not work. Try to get about 30 minutes of exercise on most days. Do not exercise 2-3 hours before going to bed. Limit naps during the day to 30 minutes or less. Do not use any products that contain nicotine or tobacco, such as cigarettes or e-cigarettes. If you need help quitting, ask your health care provider. Do not drink caffeinated beverages for at least 8 hours before going to bed. Coffee, tea, and some sodas contain caffeine. Do not drink alcohol close to bedtime. Do not eat large meals close to bedtime. Do not take naps in the late afternoon. Try to get at least 30 minutes of sunlight every day. Morning sunlight is best. Make time to relax before bed. Reading, listening to music, or taking a hot bath promotes quality sleep. Make your bedroom a place that promotes quality sleep. Keep your bedroom dark, quiet, and at a comfortable room temperature. Make sure your bed is comfortable. Take out sleep distractions like TV, a computer, smartphone, and bright lights. If you are lying awake in bed for longer than 20 minutes, get up and do a relaxing activity until you feel sleepy. Work with your health care provider to treat medical conditions that may affect sleeping, such as: Nasal obstruction. Snoring. Sleep apnea and other sleep disorders. Talk to your health care  provider if you think any of your prescription medicines may cause you to have difficulty falling or staying asleep. If you have sleep problems, talk with a sleep consultant. If you think you have a sleep disorder, talk with your health care provider about getting evaluated by a specialist. Where to find more information [place] website: https://sleepfoundation.org National Heart, Lung, and [place] ([place]): http://www.saunders.info/.pdf Centers for Disease Control and Prevention (CDC): LearningDermatology.pl Contact a health care provider if you: Have trouble getting to sleep or staying asleep. Often wake up very early in the morning and cannot get back to sleep. Have daytime sleepiness. Have daytime sleep attacks of suddenly falling asleep and sudden muscle weakness (narcolepsy). Have a tingling sensation in your legs with a strong urge to move your legs (restless legs syndrome). Stop breathing briefly during sleep (sleep apnea). Think you have a sleep disorder or are taking a medicine that is affecting your quality of sleep. Summary Most adults need 7-8 hours of quality sleep each night. Getting enough quality sleep is an important part of health and well-being. Make your bedroom a place that promotes quality sleep and avoid things that may cause you to have poor sleep, such as alcohol, caffeine, smoking, and large meals. Talk to your health care provider if you have trouble falling  asleep or staying asleep. This information is not intended to replace advice given to you by your health care provider. Make sure you discuss any questions you have with your health care provider. Document Revised: 03/17/2018 Document Reviewed: 03/17/2018 Elsevier Patient Education  [place].

## 2021-10-24 NOTE — Progress Notes (Addendum)
SLEEP MEDICINE CLINIC    Provider:  Larey Seat, MD  Primary Care Physician:  [name], MD [address] [place] [address]     Referring Provider: [name], [place],  [place] [address]          Chief Complaint according to patient   Patient presents with:     New Patient (Initial Visit)     alone. Internal referral for OSA. Last SS done 5 years ago, has a CPAP that she has not used in almost a year. Pts reports machine is not working properly and would like a new CPAP. Pt reports not sleeping well.       HISTORY OF PRESENT ILLNESS:  Jocelyn Camacho is a 75 y.o. year old 72 or [place] American female patient seen here as a referral on 10/24/2021 from Dr. Melford Aase for a new evaluation of OSA. Marland Kitchen  Chief concern according to patient :  I had a sleep study on [address], <[id number]>_2<\/[id number]>_0  Sleep, many years ago- and need a new CPAP. I have not been using my machine for a year now , it wasn't working as well as I thought it should" I was last tested and treated over 10 years ago. ".    I have the pleasure of seeing Jocelyn Camacho today, a right-handed [place] or [place] American female with a history of OSA sleep disorder.  She has a past medical history of Allergic rhinitis, Allergy, Anxiety, Hyperlipidemia, Hypertension, Obesity, OSA (obstructive sleep apnea), Type II or unspecified type diabetes mellitus with renal manifestations, not stated as uncontrolled(250.40), Unspecified hypothyroidism, and Vitamin D deficiency..     Sleep relevant medical history: now not on CPAP : Nocturia 2 times , no Tonsillectomy, no cervical spine surgery, no deviated septum . Knee arthritis. DM on insulin.     Family medical /sleep history: no  other family member on CPAP with OSA,.    Social history:  Patient is retired from [employer] and lives in a household with spouse,  adult daughter lives nearby. No pets.Tobacco use/ .  ETOH use /,   Caffeine intake in form of Coffee( 1-2 a week) Soda( /) Tea ( /) or energy drinks. Regular exercise in form of -nothing, not a swimmer.         Sleep habits are as follows: The patient's dinner time is between 6 PM. The patient goes to sleep on a love seat in her den- not reclined- seated. Tv is on , lights on all night-  PM and continues to sleep for intervals of 2 -3 hours,  wakes for 2 bathroom breaks, the first time at 3 AM.   The preferred sleep position is seated, with the support of one pillow.  Dreams are reportedly rare.  7  AM is the usual rise time. The patient wakes up spontaneously.  She reports not feeling unrefreshed or unrestored in AM, with symptoms such as dry mouth, morning headaches, and residual fatigue.  Naps are taken infrequently, lasting from 15 to 20 minutes and are refreshing .  Review of Systems: Out of a complete 14 system review, the patient complains of only the following symptoms, and all other reviewed systems are negative.:  Fatigue, sleepiness , snoring, fragmented sleep, poor sleep routines and poor sleep hygiene.    How likely are you to doze in the following situations: 0 = not likely, 1 = slight chance, 2 = moderate chance, 3 =  high chance   Sitting and Reading? Watching Television? Sitting inactive in a public place (theater or meeting)? As a passenger in a car for an hour without a break? Lying down in the afternoon when circumstances permit? Sitting and talking to someone? Sitting quietly after lunch without alcohol? In a car, while stopped for a few minutes in traffic?   Total = 05/ 24 points   FSS endorsed at 16/ 63 points.   Social History   Socioeconomic History   Marital status: Married    Spouse name: Charles   Number of children: 1   Years of education: Not on file   Highest education level: Some college, no degree  Occupational History   Occupation: Retired  Tobacco Use   Smoking status: Never   Smokeless tobacco: Never   Vaping Use   Vaping Use: Never used  Substance and Sexual Activity   Alcohol use: Not Currently    Comment: very rarely   Drug use: No   Sexual activity: Not Currently    Partners: Male    Birth control/protection: Post-menopausal  Other Topics Concern   Not on file  Social History Narrative   Lives with husband    Right handed   Caffeine: 1 cup of coffee every 2 weeks   Social Determinants of Radio broadcast assistant Strain: Not on file  Food Insecurity: Not on file  Transportation Needs: Not on file  Physical Activity: Not on file  Stress: Not on file  Social Connections: Not on file    Family History  Problem Relation Age of Onset   Breast cancer Mother    Renal Disease Mother    Hypertension Sister    Diabetes Sister    Diabetes Maternal Grandmother    Colon cancer Neg Hx    Esophageal cancer Neg Hx    Stomach cancer Neg Hx    Rectal cancer Neg Hx     Past Medical History:  Diagnosis Date   Allergic rhinitis    Allergy    Anxiety    with medical procedures   Hyperlipidemia    Hypertension    Obesity    OSA (obstructive sleep apnea)    wears CPAP   Type II or unspecified type diabetes mellitus with renal manifestations, not stated as uncontrolled(250.40)    Unspecified hypothyroidism    Vitamin D deficiency     Past Surgical History:  Procedure Laterality Date    eye procedure     have treatment where a needle is stuck into left eye- since June 2014; now every six weeks   DENTAL EXAMINATION UNDER ANESTHESIA     TUBAL LIGATION       Current Outpatient Medications on File Prior to Visit  Medication Sig Dispense Refill   allopurinol (ZYLOPRIM) 300 MG tablet Take  1/2 tablet (150 mg)  Daily  to Prevent Gout 45 tablet 2   Ascorbic Acid (VITAMIN C) 1000 MG tablet Take 1,000 mg by mouth daily.     aspirin EC 81 MG tablet Take 81 mg by mouth daily.     atenolol (TENORMIN) 100 MG tablet Take 1 tablet by mouth once daily for blood pressure 90 tablet 0    BLACK PEPPER-TURMERIC PO Take by mouth daily.     Blood Glucose Monitoring Suppl (ONE TOUCH ULTRA 2) w/Device KIT check blood sugar 3 times daily-DX-E11.21 1 kit 0   Cholecalciferol (VITAMIN D3) 5000 units CAPS Take 10,000 Units by mouth daily.     colchicine  0.6 MG tablet TAKE 1 TABLET BY MOUTH DAILY FOR GOUT PREVENTION 90 tablet 3   Cyanocobalamin (B-12) 1000 MCG SUBL Place 1 tablet under the tongue 2 (two) times a week.     dorzolamide-timolol (COSOPT) 22.3-6.8 MG/ML ophthalmic solution Place 1 drop into the left eye 2 (two) times daily.     Dulaglutide (TRULICITY) 1.5 KX/3.8HW SOPN Inject 1.5 mg into the skin once a week. 6 mL 1   Finerenone (KERENDIA) 10 MG TABS Take 10 mg by mouth daily. Take  1 tablet  Daily  for Diabetic Kidneys 90 tablet 0   furosemide (LASIX) 80 MG tablet TAKE 1 TABLET BY MOUTH 1 TO 2 TIMES PER DAY AS NEEDED FOR FLUID RETENTION OR ANKLE SWELLING 180 tablet 3   hyoscyamine (LEVSIN, ANASPAZ) 0.125 MG tablet Take 1 tablet (0.125 mg total) by mouth every 6 (six) hours as needed. 60 tablet 1   insulin NPH-regular Human (70-30) 100 UNIT/ML injection Patient injects 25 units in the AM and 10 units in the PM. Reduce each insulin dose by 2 units if glucose persistently <100 10 mL    Lancets (ONETOUCH ULTRASOFT) lancets Check blood sugar 3 times daily-DX-E10.9 300 each 1   levothyroxine (SYNTHROID) 125 MCG tablet TAKE 1 TABLET DAILY ON AN EMPTY STOMACH WITH ONLY WATER FO 30 MINUTES AND NO ANTACID MEDS, CALCIUM OR MAGNESIUM FOR 4 HOURS AND AVOID BIOTIN 90 tablet 3   ONETOUCH ULTRA test strip CHECK BLOOD SUGAR 3 TIMES A DAY 300 strip 3   polymyxin B 500,000 Units in sodium chloride irrigation 0.9 % 500 mL Irrigate with 1 application as directed every 30 (thirty) days. When getting her eye inj.     rosuvastatin (CRESTOR) 5 MG tablet Take 1 tablet once weekly as directed for Cholesterol LDL goal less than 70. 13 tablet 3   zinc gluconate 50 MG tablet Take 50 mg by mouth daily.      No current facility-administered medications on file prior to visit.    Allergies  Allergen Reactions   Penicillins Swelling   Sulfa Antibiotics Itching and Rash    Physical exam:  Today's Vitals   10/24/21 0833  BP: (!) 143/85  Pulse: 87  Weight: 250 lb (113.4 kg)  Height: _0  (1.575 m)   Body mass index is 45.73 kg/m.   Wt Readings from Last 3 Encounters:  10/24/21 250 lb (113.4 kg)  09/25/21 250 lb 6.4 oz (113.6 kg)  08/08/21 253 lb (114.8 kg)     Ht Readings from Last 3 Encounters:  10/24/21 _1  (1.575 m)  09/25/21 _2  (1.575 m)  08/08/21 _3  (1.575 m)      General: The patient is awake, alert and appears not in acute distress. Head: Normocephalic, atraumatic. Neck is supple. Mallampati 3 plus ,  neck circumference:15 inches . Nasal airflow  patent.  Retrognathia is  seen.  Dental status: dentures.  Cardiovascular:  Regular rate and cardiac rhythm by pulse,  without distended neck veins. Respiratory: Lungs are clear to auscultation.  Skin: Vitiligo, ankle edema,  Trunk: The patient's posture is reclined    Neurologic exam : The patient is awake and alert, oriented to place and time.   Memory subjective described as intact.  Attention span & concentration ability appears normal.  Speech is fluent,  without  dysarthria, dysphonia or aphasia.  Mood and affect are appropriate.   Cranial nerves: no loss of smell or taste reported  Pupils are equal and briskly reactive to  light. Funduscopic exam deferred. Bilateral ptosis, left eye worse.  Extraocular movements in vertical and horizontal planes were intact and without nystagmus. No Diplopia. Visual fields by finger perimetry are intact.low visual acuity on the left eye.  Hearing was intact to soft voice and finger rubbing.    Facial sensation intact to fine touch.  Facial motor strength is symmetric and tongue and uvula move midline.  Neck ROM : rotation, tilt and flexion extension were normal for age  and shoulder shrug was symmetrical.    Motor exam:  Symmetric bulk, tone and ROM.   Normal tone without cog wheeling, symmetric grip strength .   Sensory:  Fine touch and vibration were normal.  Proprioception tested in the upper extremities was normal.   Coordination: Rapid alternating movements in the fingers/hands were of normal speed.  The Finger-to-nose maneuver was intact without evidence of ataxia, dysmetria or tremor.   Gait and station: uses a cane  Deep tendon reflexes: none elicited at either patella, upper extremity reflexes were Intact.       THIS WAS A DEDICATED SLEEP MEDICINE EVALUATION AND NOT A NEUROLOGY CONSULTATION>   After spending a total time of  45 minutes face to face and additional time for physical and neurologic examination, review of laboratory studies,  personal review of imaging studies, reports and results of other testing and review of referral information / records as far as provided in visit, I have established the following assessments:  NO EDS by Epworth score and no elevated level of fatigue.   1) Loud snorer per spouse- OSA ? Long time since being diagnosed with sleep apnea, no study to review. CPAP is too old to be data accessible.  2) no established sleep hygiene rules, routines, TV on, lights on, sleeps on a 2 seater sofa !  3) ankle edema ( CHF ?) , morbid obesity, full sets dentures are removed at night-  , all contributing to upper airway obstruction and higher apnea risk.    My Plan is to proceed with:  1) HST , the only test [facility] will allow.  2) patient may need to wear dentures in order to have CPAP mask fitted, if OSA still present.  3) auto-immune disease present - will contribute to fatigue.   I would like to thank [name], MD and [name], [place],  [place] [address] for allowing me to meet with and to take care of this pleasant patient.   I plan to follow up either personally or  through our NP within 2-4 month.   CC: I will share my notes with PCP .  Electronically signed by: Larey Seat, MD 10/24/2021 9:19 AM Addendum: 12-04-2021: Mrs. Zehava Turski. Footman brought a copy of her [place] sleep medicine sleep study from 05-09-2005 with her to her appointment here she was diagnosed with moderate obstructive sleep apnea RDI was 21.7 there was actually no REM sleep recorded.  The arousal index was high at 7.1/h snoring was mild limb movements were minimal and did not consider constitute a sleep disorder about himself.  Sleep efficiency was very poor 37.8% only 52% of total sleep time was spent with an oxygen saturation less than 90% and the Epworth score was 12 at the time.  Her sleep physician Dr. Deland Pretty recommended CPAP and in addition possible Provigil to overcome fatigue and sleepiness.  He also recommended a sleep aid such as Lunesta or Ambien for the following CPAP titration study. Asencion Partridge Judeen Geralds,  MD   [facility] Board certified by The [employer] of Sleep Medicine and Diplomate of the [employer] of Sleep Medicine. Board certified In Neurology through the [place], Fellow of the [employer] of Neurology. Medical Director of [employer].

## 2021-10-28 ENCOUNTER — Encounter (INDEPENDENT_AMBULATORY_CARE_PROVIDER_SITE_OTHER): Payer: Medicare Other | Admitting: Ophthalmology

## 2021-10-28 ENCOUNTER — Other Ambulatory Visit: Payer: Self-pay

## 2021-10-28 ENCOUNTER — Telehealth: Payer: Self-pay | Admitting: Neurology

## 2021-10-28 DIAGNOSIS — H35033 Hypertensive retinopathy, bilateral: Secondary | ICD-10-CM

## 2021-10-28 DIAGNOSIS — H34812 Central retinal vein occlusion, left eye, with macular edema: Secondary | ICD-10-CM | POA: Diagnosis not present

## 2021-10-28 DIAGNOSIS — I10 Essential (primary) hypertension: Secondary | ICD-10-CM | POA: Diagnosis not present

## 2021-10-28 DIAGNOSIS — H43813 Vitreous degeneration, bilateral: Secondary | ICD-10-CM

## 2021-10-28 NOTE — Telephone Encounter (Signed)
Pt was called today to schedule her sleep study but pt stated she would call back in a few days to schedule.

## 2021-10-31 ENCOUNTER — Encounter: Payer: Self-pay | Admitting: Internal Medicine

## 2021-11-10 NOTE — Progress Notes (Signed)
Future Appointments  Date Time Provider [place]  11/11/2021  9:30 AM [name], MD [facility] None  12/02/2021  8:15 AM [name], MD [facility] None  12/03/2021  9:00 PM [facility] None  03/04/2022       6 month  10:30 AM Magda Bernheim, NP [facility] None  08/12/2022       CPE   9:00 AM [name], NP [facility] None    History of Present Illness:       This very nice 75 y.o. MBF  presents for 3 month follow up with HTN, HLD, Pre-Diabetes and Vitamin D Deficiency.  Her Gout is controlled on her Allopurinol.  Patient also has OSA & infreq uses her CPAP.       Patient is treated for HTN (1984) & BP has been controlled at home. Today's BP was initially elevated & rechecked at goal - 136/84. Patient has had no complaints of any cardiac type chest pain, palpitations, dyspnea / orthopnea / PND, dizziness, claudication, or dependent edema.       Hyperlipidemia is controlled with diet & Rosuvastatin. Patient denies myalgias or other med SE's. Last Lipids were at goal :  Lab Results  Component Value Date   CHOL 126 08/08/2021   HDL 38 (L) 08/08/2021   LDLCALC 69 08/08/2021   TRIG 108 08/08/2021   CHOLHDL 3.3 08/08/2021     Also, the patient has Morbid Obesity  (BMI 46+) and  takes Novolin 70 /26 bid & Trulicity (started Aug 94WN) for her T2_NIDDM (2007)  w/ CKD3b  (GFR 32) and is on [place].  She also has 2 0  hyperparathyroidism.  Her Nephrologist is  Dr Elmarie Shiley.  Patient has  had no symptoms of reactive hypoglycemia, diabetic polys, paresthesias or visual blurring.  Last A1c was at goal :  Lab Results  Component Value Date   HGBA1C 5.8 (H) 08/08/2021   Wt Readings from Last 3 Encounters:  10/24/21 250 lb   09/25/21 250 lb 6.4 oz   08/08/21 253 lb                                                    Patient was started on thyroid replacement when dx'd Hypothyroid in 2004.                                                   Further, the  patient also has history of Vitamin D Deficiency ("38" /2016) and supplements vitamin D without any suspected side-effects. Last vitamin D was at goal :  Lab Results  Component Value Date   VD25OH 52 08/06/2020     Current Outpatient Medications on File Prior to Visit  Medication Sig   allopurinol  300 MG tablet Take  1/2 tablet (150 mg)  Daily     ALPRAZolam 0.5 MG tablet Take 1 tablet at bedtime as needed    VITAMIN C 1000 MG tablet Take daily.   aspirin EC 81 MG tablet Take daily.   atenolol 100 MG tablet Take 1 tablet  once daily   BLACK PEPPER-TURMERIC  Take daily.   VITAMIN D 5,000 u Take 10,000 Units daily.  colchicine 0.6 MG tablet TAKE 1 TABLET  DAILY FOR GOUT    Cyanocobalamin (B-12) 1000 MCG SUBL Place 1 tablet under the tongue 2  times a week.   COSOPT ophthalmic solution Place 1 drop into the left eye 2  times daily.   TRULICITY 1.5 BZ/1.6RC SOPN Inject 1.5 mg into the skin once a week.   Finerenone (KERENDIA) 10 MG TABS Take  daily for Diabetic Kidneys   furosemide (80 MG tablet TAKE 1 TABLET 1-2 x/day AS NEEDED    Hyoscyamine 0.125 MG tablet Take 1 tablet  every 6  hours as needed.   insulin NPH-regular Human (70-30)  Patient injects 20 u  AM &   8 u PM   levothyroxine 125 MCG tablet TAKE 1 TABLET DAILY    polymyxin B 500,000 Units in NaCl irrigation  Irrigate with every 30 days  w/her eye inj.   rosuvastatin 5 MG tablet Take 1 tablet weekly   Zinc  50 MG tablet Take daily.     Allergies  Allergen Reactions   Penicillins Swelling   Sulfa Antibiotics Itching and Rash    PMHx:   Past Medical History:  Diagnosis Date   Allergic rhinitis    Allergy    Anxiety    with medical procedures   Hyperlipidemia    Hypertension    Obesity    OSA (obstructive sleep apnea)    wears CPAP   Type II  diabetes mellitus (250.40)    hypothyroidism    Vitamin D deficiency      Immunization History  Administered Date(s) Administered   DT  06/19/2015   DTaP  12/09/2004   PFIZER Covid-19 Tri-Sucrose Vacc 04/18/2021   PFIZER SARS-COV-2 Vacc 02/03/2020, 02/28/2020, 10/09/2020   Pneumococcal -13 12/19/2014   Pneumococcal -23 03/02/2009, 01/17/2019     Past Surgical History:  Procedure Laterality Date    eye procedure     have treatment where a needle is stuck into left eye- since June 2014; now every six weeks   DENTAL EXAMINATION UNDER ANESTHESIA     TUBAL LIGATION      FHx:    Reviewed / unchanged  SHx:    Reviewed / unchanged   Systems Review:  Constitutional: Denies fever, chills, wt changes, headaches, insomnia, fatigue, night sweats, change in appetite. Eyes: Denies redness, blurred vision, diplopia, discharge, itchy, watery eyes.  ENT: Denies discharge, congestion, post nasal drip, epistaxis, sore throat, earache, hearing loss, dental pain, tinnitus, vertigo, sinus pain, snoring.  CV: Denies chest pain, palpitations, irregular heartbeat, syncope, dyspnea, diaphoresis, orthopnea, PND, claudication or edema. Respiratory: denies cough, dyspnea, DOE, pleurisy, hoarseness, laryngitis, wheezing.  Gastrointestinal: Denies dysphagia, odynophagia, heartburn, reflux, water brash, abdominal pain or cramps, nausea, vomiting, bloating, diarrhea, constipation, hematemesis, melena, hematochezia  or hemorrhoids. Genitourinary: Denies dysuria, frequency, urgency, nocturia, hesitancy, discharge, hematuria or flank pain. Musculoskeletal: Denies arthralgias, myalgias, stiffness, jt. swelling, pain, limping or strain/sprain.  Skin: Denies pruritus, rash, hives, warts, acne, eczema or change in skin lesion(s). Neuro: No weakness, tremor, incoordination, spasms, paresthesia or pain. Psychiatric: Denies confusion, memory loss or sensory loss. Endo: Denies change in weight, skin or hair change.  Heme/Lymph: No excessive bleeding, bruising or enlarged lymph nodes.  Physical Exam  BP 136/84   Pulse 85   Temp 97.9 F (36.6 C)   Resp 16   Ht 5\' 2"   (1.575 m)   Wt 250 lb 6.4 oz (113.6 kg)   SpO2 99%   BMI 45.80 kg/m   Appears  severely obese and in no distress.  Eyes: PERRLA, EOMs, conjunctiva no swelling or erythema. Sinuses: No frontal/maxillary tenderness ENT/Mouth: EAC's clear, TM's nl w/o erythema, bulging. Nares clear w/o erythema, swelling, exudates. Oropharynx clear without erythema or exudates. Oral hygiene is good. Tongue normal, non obstructing. Hearing intact.  Neck: Supple. Thyroid not palpable. Car 2+/2+ without bruits, nodes or JVD. Chest: Respirations nl with BS clear & equal w/o rales, rhonchi, wheezing or stridor.  Cor: Heart sounds normal w/ regular rate and rhythm without sig. murmurs, gallops, clicks or rubs. Peripheral pulses normal and equal  without edema.  Abdomen: Soft & bowel sounds normal. Non-tender w/o guarding, rebound, hernias, masses or organomegaly.  Lymphatics: Unremarkable.  Musculoskeletal: Full ROM all peripheral extremities, joint stability, 5/5 strength and normal gait.  Skin: Warm, dry without exposed rashes, lesions or ecchymosis apparent.  Neuro: Cranial nerves intact, reflexes equal bilaterally. Sensory-motor testing grossly intact. Tendon reflexes grossly intact.  Pysch: Alert & oriented x 3.  Insight and judgement nl & appropriate. No ideations.  Assessment and Plan:  1. Essential hypertension  - Continue medication, monitor blood pressure at home.  - Continue DASH diet.  Reminder to go to the ER if any CP,  SOB, nausea, dizziness, severe HA, changes vision/speech.    - CBC with Differential/Platelet - COMPLETE METABOLIC PANEL WITH GFR - Magnesium - TSH  2. Hyperlipidemia associated with type 2 diabetes mellitus ([place])  - Continue diet/meds, exercise,& lifestyle modifications.  - Continue monitor periodic cholesterol/liver & renal functions     - Lipid panel - TSH  3. Type 2 diabetes mellitus with stage 3b chronic kidney  disease, with long-term current use of insulin  (HCC)  - Continue diet, exercise  - Lifestyle modifications.  - Monitor appropriate labs   - Fructosamine  4. Vitamin D deficiency  - Continue supplementation    5. Hyperparathyroidism, secondary renal (HCC)  - COMPLETE METABOLIC PANEL WITH GFR  6. Hypothyroidism, unspecified type  - TSH  7. Chronic gout without tophus  - Uric acid  8. Medication management  - CBC with Differential/Platelet - COMPLETE METABOLIC PANEL WITH GFR - Magnesium - Lipid panel - TSH - Fructosamine - Uric acid        Discussed  regular exercise, BP monitoring, weight control to achieve/maintain BMI less than 25 and discussed med and SE's. Recommended labs to assess and monitor clinical status with further disposition pending results of labs.  I discussed the assessment and treatment plan with the patient. The patient was provided an opportunity to ask questions and all were answered. The patient agreed with the plan and demonstrated an understanding of the instructions.  I provided over 30 minutes of exam, counseling, chart review and  complex critical decision making.        The patient was advised to call back or seek an in-person evaluation if the symptoms worsen or if the condition fails to improve as anticipated.   [name], MD

## 2021-11-10 NOTE — Patient Instructions (Signed)

## 2021-11-11 ENCOUNTER — Encounter: Payer: Self-pay | Admitting: Internal Medicine

## 2021-11-11 ENCOUNTER — Other Ambulatory Visit: Payer: Self-pay

## 2021-11-11 ENCOUNTER — Ambulatory Visit (INDEPENDENT_AMBULATORY_CARE_PROVIDER_SITE_OTHER): Payer: Medicare Other | Admitting: Internal Medicine

## 2021-11-11 VITALS — BP 136/84 | HR 85 | Temp 97.9°F | Resp 16 | Ht 62.0 in | Wt 250.4 lb

## 2021-11-11 DIAGNOSIS — E785 Hyperlipidemia, unspecified: Secondary | ICD-10-CM

## 2021-11-11 DIAGNOSIS — E559 Vitamin D deficiency, unspecified: Secondary | ICD-10-CM | POA: Diagnosis not present

## 2021-11-11 DIAGNOSIS — M1A9XX0 Chronic gout, unspecified, without tophus (tophi): Secondary | ICD-10-CM

## 2021-11-11 DIAGNOSIS — Z794 Long term (current) use of insulin: Secondary | ICD-10-CM

## 2021-11-11 DIAGNOSIS — I10 Essential (primary) hypertension: Secondary | ICD-10-CM | POA: Diagnosis not present

## 2021-11-11 DIAGNOSIS — N1832 Chronic kidney disease, stage 3b: Secondary | ICD-10-CM

## 2021-11-11 DIAGNOSIS — E1122 Type 2 diabetes mellitus with diabetic chronic kidney disease: Secondary | ICD-10-CM

## 2021-11-11 DIAGNOSIS — N2581 Secondary hyperparathyroidism of renal origin: Secondary | ICD-10-CM

## 2021-11-11 DIAGNOSIS — E1169 Type 2 diabetes mellitus with other specified complication: Secondary | ICD-10-CM | POA: Diagnosis not present

## 2021-11-11 DIAGNOSIS — E039 Hypothyroidism, unspecified: Secondary | ICD-10-CM

## 2021-11-11 DIAGNOSIS — Z79899 Other long term (current) drug therapy: Secondary | ICD-10-CM

## 2021-11-11 MED ORDER — TRULICITY 3 MG/0.5ML ~~LOC~~ SOAJ: 3.0000 mg | SUBCUTANEOUS | 0 refills | Status: DC

## 2021-11-12 NOTE — Progress Notes (Signed)
============================================================ ============================================================  -    Mild Anemia is Stable  ============================================================ ============================================================  -  Kidney Functions  Stage 3b   ( GFR 33)  is Stable  ============================================================ ============================================================  -  Total Chol = 119     &    LDL   Chol = 65   - Both  Excellent   - Very low risk for Heart Attack  / Stroke ============================================================ ============================================================  -  Uric Acid / Gout test Normal -  Please Continue Allopurinol  - Same  ============================================================ ============================================================  -  All Else -  Electrolytes - Liver - Magnesium & Thyroid   -     all  Normal / OK ============================================================ ============================================================  - Fructosamine is still pending    ============================================================ ============================================================

## 2021-11-17 LAB — FRUCTOSAMINE: Fructosamine: 277 umol/L (ref 205–285)

## 2021-11-17 LAB — CBC WITH DIFFERENTIAL/PLATELET
Absolute Monocytes: 646 cells/uL (ref 200–950)
Basophils Absolute: 67 cells/uL (ref 0–200)
Basophils Relative: 0.7 %
Eosinophils Absolute: 181 cells/uL (ref 15–500)
Eosinophils Relative: 1.9 %
HCT: 35.1 % (ref 35.0–45.0)
Hemoglobin: 11.3 g/dL — ABNORMAL LOW (ref 11.7–15.5)
Lymphs Abs: 2689 cells/uL (ref 850–3900)
MCH: 29.3 pg (ref 27.0–33.0)
MCHC: 32.2 g/dL (ref 32.0–36.0)
MCV: 90.9 fL (ref 80.0–100.0)
MPV: 13 fL — ABNORMAL HIGH (ref 7.5–12.5)
Monocytes Relative: 6.8 %
Neutro Abs: 5919 cells/uL (ref 1500–7800)
Neutrophils Relative %: 62.3 %
Platelets: 325 10*3/uL (ref 140–400)
RBC: 3.86 10*6/uL (ref 3.80–5.10)
RDW: 14.7 % (ref 11.0–15.0)
Total Lymphocyte: 28.3 %
WBC: 9.5 10*3/uL (ref 3.8–10.8)

## 2021-11-17 LAB — COMPLETE METABOLIC PANEL WITH GFR
AG Ratio: 1.4 (calc) (ref 1.0–2.5)
ALT: 16 U/L (ref 6–29)
AST: 21 U/L (ref 10–35)
Albumin: 4.2 g/dL (ref 3.6–5.1)
Alkaline phosphatase (APISO): 83 U/L (ref 37–153)
BUN/Creatinine Ratio: 14 (calc) (ref 6–22)
BUN: 22 mg/dL (ref 7–25)
CO2: 26 mmol/L (ref 20–32)
Calcium: 9.2 mg/dL (ref 8.6–10.4)
Chloride: 100 mmol/L (ref 98–110)
Creat: 1.6 mg/dL — ABNORMAL HIGH (ref 0.60–1.00)
Globulin: 3 g/dL (calc) (ref 1.9–3.7)
Glucose, Bld: 90 mg/dL (ref 65–99)
Potassium: 4 mmol/L (ref 3.5–5.3)
Sodium: 138 mmol/L (ref 135–146)
Total Bilirubin: 0.8 mg/dL (ref 0.2–1.2)
Total Protein: 7.2 g/dL (ref 6.1–8.1)
eGFR: 33 mL/min/{1.73_m2} — ABNORMAL LOW (ref >=60)

## 2021-11-17 LAB — MAGNESIUM: Magnesium: 2.3 mg/dL (ref 1.5–2.5)

## 2021-11-17 LAB — LIPID PANEL
Cholesterol: 119 mg/dL (ref <200)
HDL: 34 mg/dL — ABNORMAL LOW (ref >=50)
LDL Cholesterol (Calc): 65 mg/dL (calc)
Non-HDL Cholesterol (Calc): 85 mg/dL (calc) (ref <130)
Total CHOL/HDL Ratio: 3.5 (calc) (ref <5.0)
Triglycerides: 121 mg/dL (ref <150)

## 2021-11-17 LAB — TSH: TSH: 3.36 mIU/L (ref 0.40–4.50)

## 2021-11-17 LAB — URIC ACID: Uric Acid, Serum: 5.9 mg/dL (ref 2.5–7.0)

## 2021-11-30 ENCOUNTER — Other Ambulatory Visit: Payer: Self-pay | Admitting: Internal Medicine

## 2021-11-30 MED ORDER — DOXYCYCLINE HYCLATE 100 MG PO CAPS: ORAL_CAPSULE | ORAL | 1 refills | Status: DC

## 2021-12-02 ENCOUNTER — Other Ambulatory Visit: Payer: Self-pay

## 2021-12-02 ENCOUNTER — Encounter (INDEPENDENT_AMBULATORY_CARE_PROVIDER_SITE_OTHER): Payer: Medicare Other | Admitting: Ophthalmology

## 2021-12-02 DIAGNOSIS — H35033 Hypertensive retinopathy, bilateral: Secondary | ICD-10-CM

## 2021-12-02 DIAGNOSIS — H34812 Central retinal vein occlusion, left eye, with macular edema: Secondary | ICD-10-CM

## 2021-12-02 DIAGNOSIS — I10 Essential (primary) hypertension: Secondary | ICD-10-CM | POA: Diagnosis not present

## 2021-12-02 DIAGNOSIS — H43813 Vitreous degeneration, bilateral: Secondary | ICD-10-CM

## 2021-12-03 ENCOUNTER — Ambulatory Visit (INDEPENDENT_AMBULATORY_CARE_PROVIDER_SITE_OTHER): Payer: Medicare Other | Admitting: Neurology

## 2021-12-03 DIAGNOSIS — R0601 Orthopnea: Secondary | ICD-10-CM

## 2021-12-03 DIAGNOSIS — Z72821 Inadequate sleep hygiene: Secondary | ICD-10-CM

## 2021-12-03 DIAGNOSIS — G4733 Obstructive sleep apnea (adult) (pediatric): Secondary | ICD-10-CM

## 2021-12-03 DIAGNOSIS — G4734 Idiopathic sleep related nonobstructive alveolar hypoventilation: Secondary | ICD-10-CM

## 2021-12-03 DIAGNOSIS — N1832 Chronic kidney disease, stage 3b: Secondary | ICD-10-CM

## 2021-12-03 DIAGNOSIS — Z794 Long term (current) use of insulin: Secondary | ICD-10-CM

## 2021-12-03 DIAGNOSIS — M25471 Effusion, right ankle: Secondary | ICD-10-CM

## 2021-12-03 DIAGNOSIS — R0683 Snoring: Secondary | ICD-10-CM

## 2021-12-03 DIAGNOSIS — Z6841 Body Mass Index (BMI) 40.0 and over, adult: Secondary | ICD-10-CM

## 2021-12-03 DIAGNOSIS — E1122 Type 2 diabetes mellitus with diabetic chronic kidney disease: Secondary | ICD-10-CM

## 2021-12-03 DIAGNOSIS — H3530 Unspecified macular degeneration: Secondary | ICD-10-CM

## 2021-12-03 DIAGNOSIS — D638 Anemia in other chronic diseases classified elsewhere: Secondary | ICD-10-CM

## 2021-12-03 DIAGNOSIS — M25472 Effusion, left ankle: Secondary | ICD-10-CM

## 2021-12-09 DIAGNOSIS — M25472 Effusion, left ankle: Secondary | ICD-10-CM | POA: Insufficient documentation

## 2021-12-09 NOTE — Addendum Note (Signed)
Addended by: Larey Seat on: 12/09/2021 05:07 PM   Modules accepted: Orders

## 2021-12-09 NOTE — Progress Notes (Signed)
OXYGEN SATURATION & C02:  The Wake baseline 02 saturation was 94%, with the lowest being 83%. Time spent below 89% saturation equaled 64 minutes. IMPRESSION:  1. Moderate -severe Obstructive Sleep Apnea (OSA) with severe and prolonged hypoxia.  2. All sleep in NREM and all sleep was recorded in supine position. No Periodic Limb Movement Disorder (PLMD) Mild to moderate Snoring   RECOMMENDATIONS:  1. Advise full night, attended, CPAP titration study to optimize therapy so she can get supplemental oxygen if needed. This patient may benefit from follow up with pulmonology.   This patient needs to be referred to cognitive behavior therapy to establish sleep routines.  Her fatigue can related to autoimmune disorder underlying,

## 2021-12-09 NOTE — Procedures (Signed)
PATIENT'S NAME:  Jocelyn Camacho, Jocelyn Camacho DOB:      13-Aug-1946      MR#:    [id number]     DATE OF RECORDING: 12/03/2021 Artis Flock REFERRING M.D.:  [name], NP Study Performed:   Baseline Polysomnogram HISTORY: 10-24-2021: THIS WAS A DEDICATED SLEEP MEDICINE EVALUATION AND IS NOT A NEUROLOGY CONSULTATION:   Jocelyn Camacho ,an African American 75 year-old female with a history of OSA/ sleep disorder presented with several complaints.  She has medical history of Allergic rhinitis, Anxiety, Hyperlipidemia, Hypertension, morbid Obesity grade 3, OSA (obstructive sleep apnea), Type II or unspecified type diabetes mellitus with renal manifestations, not stated as uncontrolled(250.40), Unspecified hypothyroidism, and Vitamin D deficiency. NO EDS by Epworth score and no elevated level of fatigue. She reports chronic discomfort and pain, which interferes with sleep.   1) Loud snorer per spouse- but OSA ? Long time since being diagnosed with sleep apnea, no study to review. Her CPAP is too old to be data accessible.  2) no existing and established sleep hygiene rules, routines, TV on, lights on, sleeps on a 2 seater sofa !  3) ankle edema (CHF?) , morbid obesity, full sets of dentures are removed at night,  all contributing to upper airway obstruction and higher apnea risk.  The patient endorsed the Epworth Sleepiness Scale at 5/24 points.   The patient's weight 250 pounds with a height of 62 (inches), resulting in a BMI of 45.8 kg/m2. The patient's neck circumference measured 15 inches.  CURRENT MEDICATIONS: Zyloprim, Vitamin C, Aspirin, Tenormin, Turmeric, Vitamin D3, Colchicine, Y-70, Cosopt, Trulicity, Kerendia, Lasix, Levsin, Insulin NPH, Synthroid, Crestor, Zinc   PROCEDURE:  This is a multichannel digital polysomnogram utilizing the Somnostar 11.2 system.  Electrodes and sensors were applied and monitored per AASM Specifications.   EEG, EOG, Chin and Limb EMG, were sampled at 200 Hz.  ECG, Snore and Nasal  Pressure, Thermal Airflow, Respiratory Effort, CPAP Flow and Pressure, Oximetry was sampled at 50 Hz. Digital video and audio were recorded.      BASELINE STUDY  Lights Out was at 22:21 and Lights On at 05:02.  Total recording time (TRT) was 402 minutes, with a total sleep time (TST) of 234 minutes.   The patient's sleep latency was 5.5 minutes.  REM latency was 0 minutes.  The sleep efficiency was poor at only 58.2 %. No REM Sleep!     SLEEP ARCHITECTURE: WASO (Wake after sleep onset) was 47.5 minutes.  There were 9.5 minutes in Stage N1, 173 minutes Stage N2, 51.5 minutes Stage N3 and 0 minutes in Stage REM.  The percentage of Stage N1 was 4.1%, Stage N2 was 73.9%, Stage N3 was 22.% and Stage R (REM sleep) was 0%.   RESPIRATORY ANALYSIS:  There were a total of 107 respiratory events:  0 apneas and 107 hypopneas with a hypopnea index of 27.4 /hour.  The total APNEA/HYPOPNEA INDEX (AHI) was 27.4/hour.  The patient spent 234 minutes of total sleep time in the supine position and 0 minutes in non-supine.. The supine AHI was 27.4 versus a non-supine AHI of 0.0.  OXYGEN SATURATION & C02:  The Wake baseline 02 saturation was 94%, with the lowest being 83%. Time spent below 89% saturation equaled 64 minutes.  The arousals were noted as: 15 were spontaneous, 0 were associated with PLMs, 4 were associated with respiratory events. The patient had a total of no Periodic Limb Movements Snoring was mild, coughing interrupted her sleep several times, and  the cell phone went off.  Audio and video analysis did not show any abnormal or unusual movements, behaviors, phonations or vocalizations.   EKG was in keeping with normal sinus rhythm (NSR), isolated PVCs.  IMPRESSION:  Moderate severe Obstructive Sleep Apnea (OSA) with severe hypoxia.  All sleep in NREM and all sleep was recorded in supine position.  No Periodic Limb Movement Disorder (PLMD) Mild to moderate Snoring   RECOMMENDATIONS:  Advise full  night, attended, CPAP titration study to optimize therapy so she can get oxygen if needed.   This patient needs to be referred to cognitive behavior therapy to establish sleep routines.     I certify that I have reviewed the entire raw data recording prior to the issuance of this report in accordance with the Standards of Accreditation of the American Academy of Sleep Medicine (AASM)  Larey Seat, MD Diplomat, American Board of Psychiatry and Neurology  Diplomat, American Board of Sleep Medicine Market researcher, [facility] [employer]

## 2021-12-10 ENCOUNTER — Telehealth: Payer: Self-pay

## 2021-12-10 NOTE — Telephone Encounter (Signed)
I called pt. I advised pt that Dr. Brett Fairy reviewed their sleep study results and found that has moderate severe osa with severe and prolonged hypoxia and recommends that pt be treated with a cpap. Dr. Brett Fairy recommends that pt return for a repeat sleep study in order to properly titrate the cpap and ensure a good mask fit and to evaluate for supplemental oxygen as necessary Pt is agreeable to returning for a titration study. I advised pt that our sleep lab will file with pt's insurance and call pt to schedule the sleep study when we hear back from the pt's insurance regarding coverage of this sleep study. Patient is agreeable to CBT for sleep routines and will discuss this with Dr. Melford Aase. Pt verbalized understanding of results. Pt had no questions at this time but was encouraged to call back if questions arise.

## 2021-12-10 NOTE — Telephone Encounter (Signed)
-----   Message from Larey Seat, MD sent at 12/09/2021  5:07 PM EST ----- OXYGEN SATURATION & C02:  The Wake baseline 02 saturation was 94%, with the lowest being 83%. Time spent below 89% saturation equaled 64 minutes. IMPRESSION:  1. Moderate -severe Obstructive Sleep Apnea (OSA) with severe and prolonged hypoxia.  2. All sleep in NREM and all sleep was recorded in supine position. No Periodic Limb Movement Disorder (PLMD) Mild to moderate Snoring   RECOMMENDATIONS:  1. Advise full night, attended, CPAP titration study to optimize therapy so she can get supplemental oxygen if needed. This patient may benefit from follow up with pulmonology.    This patient needs to be referred to cognitive behavior therapy to establish sleep routines.  Her fatigue can related to autoimmune disorder underlying,

## 2021-12-25 ENCOUNTER — Other Ambulatory Visit: Payer: Self-pay | Admitting: Internal Medicine

## 2021-12-25 ENCOUNTER — Telehealth: Payer: Self-pay | Admitting: Neurology

## 2021-12-25 NOTE — Telephone Encounter (Signed)
Called pt to schedule her sleep study but pt was busy and stated she would call back at a better time.

## 2021-12-29 ENCOUNTER — Other Ambulatory Visit: Payer: Self-pay | Admitting: Adult Health

## 2021-12-29 DIAGNOSIS — E039 Hypothyroidism, unspecified: Secondary | ICD-10-CM

## 2022-01-06 ENCOUNTER — Other Ambulatory Visit: Payer: Self-pay

## 2022-01-06 ENCOUNTER — Encounter (INDEPENDENT_AMBULATORY_CARE_PROVIDER_SITE_OTHER): Payer: Medicare Other | Admitting: Ophthalmology

## 2022-01-06 DIAGNOSIS — I10 Essential (primary) hypertension: Secondary | ICD-10-CM

## 2022-01-06 DIAGNOSIS — H34812 Central retinal vein occlusion, left eye, with macular edema: Secondary | ICD-10-CM

## 2022-01-06 DIAGNOSIS — H43813 Vitreous degeneration, bilateral: Secondary | ICD-10-CM

## 2022-01-06 DIAGNOSIS — H35033 Hypertensive retinopathy, bilateral: Secondary | ICD-10-CM

## 2022-01-14 ENCOUNTER — Telehealth: Payer: Self-pay

## 2022-01-14 ENCOUNTER — Other Ambulatory Visit: Payer: Self-pay

## 2022-01-14 DIAGNOSIS — Z794 Long term (current) use of insulin: Secondary | ICD-10-CM

## 2022-01-14 MED ORDER — TRULICITY 3 MG/0.5ML ~~LOC~~ SOAJ: SUBCUTANEOUS | 0 refills | Status: DC

## 2022-01-14 NOTE — Telephone Encounter (Signed)
No prior auth needed for Trulicity 3mg . Plan covers the medication

## 2022-01-15 ENCOUNTER — Ambulatory Visit (INDEPENDENT_AMBULATORY_CARE_PROVIDER_SITE_OTHER): Payer: Medicare Other | Admitting: Neurology

## 2022-01-15 ENCOUNTER — Other Ambulatory Visit: Payer: Self-pay

## 2022-01-15 DIAGNOSIS — R0683 Snoring: Secondary | ICD-10-CM

## 2022-01-15 DIAGNOSIS — D638 Anemia in other chronic diseases classified elsewhere: Secondary | ICD-10-CM

## 2022-01-15 DIAGNOSIS — R0601 Orthopnea: Secondary | ICD-10-CM

## 2022-01-15 DIAGNOSIS — Z72821 Inadequate sleep hygiene: Secondary | ICD-10-CM

## 2022-01-15 DIAGNOSIS — G4733 Obstructive sleep apnea (adult) (pediatric): Secondary | ICD-10-CM

## 2022-01-15 DIAGNOSIS — Z6841 Body Mass Index (BMI) 40.0 and over, adult: Secondary | ICD-10-CM

## 2022-01-16 DIAGNOSIS — G4734 Idiopathic sleep related nonobstructive alveolar hypoventilation: Secondary | ICD-10-CM | POA: Insufficient documentation

## 2022-01-16 DIAGNOSIS — G4733 Obstructive sleep apnea (adult) (pediatric): Secondary | ICD-10-CM | POA: Insufficient documentation

## 2022-01-16 NOTE — Addendum Note (Signed)
Addended by: Larey Seat on: 01/16/2022 07:12 PM   Modules accepted: Orders

## 2022-01-16 NOTE — Progress Notes (Signed)
EKG was irregular and frequently recorded PACs and PVCs.    DIAGNOSIS Obstructive Sleep Apnea with hypoxemia responded well to 8 cm water pressure CPAP. The patient was fitted with a Medium Eson 2 nasal mask, and hypoxemia resolved under 8 cm CPAP.  1. Sleep was sustained and highly efficient.  2. abnormal EKG  PLANS/RECOMMENDATIONS: auto CPAP with a pressure setting of 5-14 cm water, heated humidification, 3 cm EPR and a nasal  Eson medium sized mask.  1. Follow common sleep hygiene measures.   2. reduce body weight. 3. Any apnea patient should avoid sedatives, hypnotics, and alcohol consumption at bedtime. 4. Your CPAP therapy compliance is defined as 4 hours or more of nightly use. Compliance will be recorded in you visit at 60-90 days of CPAP use.

## 2022-01-16 NOTE — Procedures (Signed)
PATIENT'S NAME:  Jocelyn Camacho, Jocelyn Camacho DOB:      [birth year]      MR#:    [id number]     DATE OF RECORDING: 01/15/2022 REFERRING M.D.:  [name], NP Study Performed:   CPAP  Titration HISTORY:   Patient returned for titration, following her PSG from 12-13-2021 with an AHI of 27.4/h and hypoxia.  Jocelyn Camacho has a history of OSA sleep disorder.  She has a  medical history of Allergic rhinitis, Anemia Hyperlipidemia, Hypertension, Morbid Obesity grade 3, Orthopnea, Hypoxia with OSA (obstructive sleep apnea), Type II or unspecified type diabetes mellitus with renal manifestations, not stated as uncontrolled CKD 3 (250.40), diabetic eye changes, retinopathy, hypothyroidism, Vitamin D deficiency. The patient had not been complaint  with CPAP for about 1 year when she presented in a RV.   The patient endorsed the Epworth Sleepiness Scale at 5/24 points.   The patient's weight 250 pounds with a height of 62 (inches), resulting in a BMI of 45.8 kg/m2. The patient's neck circumference measured 15 inches.  CURRENT MEDICATIONS: Zyloprim, Vitamin C, Aspirin, Tenormin, Turmeric, Vitamin D3, Colchicine, Y-10, Cosopt, Trulicity, Kerendia, Lasix, Levsin, Insulin NPH, Synthroid, Crestor, Zinc    PROCEDURE:  This is a multichannel digital polysomnogram utilizing the SomnoStar 11.2 system.  Electrodes and sensors were applied and monitored per AASM Specifications.   EEG, EOG, Chin and Limb EMG, were sampled at 200 Hz.  ECG, Snore and Nasal Pressure, Thermal Airflow, Respiratory Effort, CPAP Flow and Pressure, Oximetry was sampled at 50 Hz. Digital video and audio were recorded.       CPAP was initiated under a medium sized ESON mask at 5 cmH20 with heated humidity per AASM standards. This pressure was advanced to 11 cmH20 because of hypopneas, apneas and desaturations.  At a PAP pressure of 11 cmH20, when a reduction of the AHI to 0.o/h was reached, the total ST under this pressure was 161 minutes.  Lights Out was at  22:02 and Lights On at 05:08. Total recording time (TRT) was 426.5 minutes, with a total sleep time (TST) of 412.5 minutes. The patient's sleep latency was 10 minutes. REM latency was 111.5 minutes.  The sleep efficiency was 96.7 %.    SLEEP ARCHITECTURE: WASO (Wake after sleep onset) was 7.5 minutes.  There were 5 minutes in Stage N1, 333 minutes Stage N2, 35 minutes Stage N3 and 39.5 minutes in Stage REM.  The percentage of Stage N1 was 1.2%, Stage N2 was 80.7%, Stage N3 was 8.5% and Stage R (REM sleep) was 9.6%.   RESPIRATORY ANALYSIS:  There was a total of 16 respiratory events: 13 obstructive apneas, 0 central apneas and 0 mixed apneas with a total of 13 apneas and an apnea index (AI) of 1.9 /hour. There were 3 hypopneas with a hypopnea index of 0.4/hour.  The total APNEA/HYPOPNEA INDEX (AHI) was 2.3 /h. 7 events occurred in REM sleep and 9 events in NREM. The REM AHI was 10.6 /hour versus a non-REM AHI of 1.4 /hour.  The patient spent 412.5 minutes of total sleep time in the supine position and 0 minutes in non-supine. The supine AHI was 2.3, versus a non-supine AHI of 0.0. OXYGEN SATURATION & C02:  The baseline 02 saturation was 96%, with the lowest being 77%. Time spent below 89% saturation equaled 8 minutes. PERIODIC LIMB MOVEMENTS:  The patient had a total of 0 Periodic Limb Movements.   Audio and video analysis did not show any abnormal or unusual  movements, behaviors, phonations or vocalizations.   EKG was irregular and frequently recorded PACs and PVCs.    DIAGNOSIS Obstructive Sleep Apnea with hypoxemia responded well to 8 cm water pressure CPAP. The patient was fitted with a Medium Eson 2 nasal mask, and hypoxemia resolved under 8 cm CPAP.  Sleep was sustained and highly efficient.  abnormal EKG  PLANS/RECOMMENDATIONS: auto CPAP with a pressure setting of 5-14 cm water, heated humidification, 3 cm EPR and a nasal  Eson medium sized mask.  Follow common sleep hygiene measures.    reduce body weight. Any apnea patient should avoid sedatives, hypnotics, and alcohol consumption at bedtime. Your CPAP therapy compliance is defined as 4 hours or more of nightly use. Compliance will be recorded in you visit at 60-90 days of CPAP use.    DISCUSSION: A follow up appointment will be scheduled in the Sleep Clinic at [facility].   Please call [phone] with any questions.     I certify that I have reviewed the entire raw data recording prior to the issuance of this report in accordance with the Standards of Accreditation of the American Academy of Sleep Medicine (AASM)  Larey Seat, M.D. Diplomat, Tax adviser of Psychiatry and Neurology  Diplomat, Tax adviser of Sleep Medicine Market researcher, [facility] [employer] [facility] [employer]

## 2022-01-17 ENCOUNTER — Other Ambulatory Visit: Payer: Self-pay | Admitting: Internal Medicine

## 2022-01-17 DIAGNOSIS — Z1231 Encounter for screening mammogram for malignant neoplasm of breast: Secondary | ICD-10-CM

## 2022-01-20 ENCOUNTER — Telehealth: Payer: Self-pay | Admitting: Neurology

## 2022-01-20 NOTE — Telephone Encounter (Signed)
-----   Message from Larey Seat, MD sent at 01/16/2022  7:12 PM EST ----- EKG was irregular and frequently recorded PACs and PVCs.    DIAGNOSIS Obstructive Sleep Apnea with hypoxemia responded well to 8 cm water pressure CPAP. The patient was fitted with a Medium Eson 2 nasal mask, and hypoxemia resolved under 8 cm CPAP.  1. Sleep was sustained and highly efficient.  2. abnormal EKG  PLANS/RECOMMENDATIONS: auto CPAP with a pressure setting of 5-14 cm water, heated humidification, 3 cm EPR and a nasal  Eson medium sized mask.  1. Follow common sleep hygiene measures.   2. reduce body weight. 3. Any apnea patient should avoid sedatives, hypnotics, and alcohol consumption at bedtime. 4. Your CPAP therapy compliance is defined as 4 hours or more of nightly use. Compliance will be recorded in you visit at 60-90 days of CPAP use.

## 2022-01-20 NOTE — Telephone Encounter (Signed)
I called pt. I advised pt that Dr. Brett Fairy reviewed their sleep study results and found that pt best tolerated the cpap at a pressure of 11 cm. Dr. Brett Fairy recommends that pt starts auto CPAP. I reviewed PAP compliance expectations with the pt. Pt is agreeable to starting a CPAP. I advised pt that an order will be sent to a DME, [facility], and [facility] will call the pt within about one week after they file with the pt's insurance. [facility] = will show the pt how to use the machine, fit for masks, and troubleshoot the CPAP if needed. A follow up appt was made for insurance purposes with Debbora Presto, NP on 04/23/20 at 9 am. Pt verbalized understanding to arrive 15 minutes early and bring their CPAP. A letter with all of this information in it will be mailed to the pt as a reminder. I verified with the pt that the address we have on file is correct. Pt verbalized understanding of results. Pt had no questions at this time but was encouraged to call back if questions arise. I have sent the order to [facility] and have received confirmation that they have received the order.

## 2022-01-22 ENCOUNTER — Other Ambulatory Visit: Payer: Self-pay | Admitting: Internal Medicine

## 2022-01-22 DIAGNOSIS — M1000 Idiopathic gout, unspecified site: Secondary | ICD-10-CM

## 2022-01-30 ENCOUNTER — Ambulatory Visit
Admission: RE | Admit: 2022-01-30 | Discharge: 2022-01-30 | Disposition: A | Discharge status: Home / Self Care | Payer: Medicare Other | Source: Ambulatory Visit | Attending: Internal Medicine | Admitting: Internal Medicine

## 2022-01-30 ENCOUNTER — Other Ambulatory Visit: Payer: Self-pay

## 2022-01-30 DIAGNOSIS — Z1231 Encounter for screening mammogram for malignant neoplasm of breast: Secondary | ICD-10-CM

## 2022-02-10 ENCOUNTER — Other Ambulatory Visit: Payer: Self-pay | Admitting: Adult Health

## 2022-02-10 ENCOUNTER — Other Ambulatory Visit: Payer: Self-pay

## 2022-02-10 ENCOUNTER — Encounter (INDEPENDENT_AMBULATORY_CARE_PROVIDER_SITE_OTHER): Payer: Medicare Other | Admitting: Ophthalmology

## 2022-02-10 DIAGNOSIS — H35033 Hypertensive retinopathy, bilateral: Secondary | ICD-10-CM | POA: Diagnosis not present

## 2022-02-10 DIAGNOSIS — H34812 Central retinal vein occlusion, left eye, with macular edema: Secondary | ICD-10-CM

## 2022-02-10 DIAGNOSIS — H43813 Vitreous degeneration, bilateral: Secondary | ICD-10-CM

## 2022-02-10 DIAGNOSIS — I10 Essential (primary) hypertension: Secondary | ICD-10-CM | POA: Diagnosis not present

## 2022-02-11 ENCOUNTER — Other Ambulatory Visit: Payer: Self-pay | Admitting: Adult Health

## 2022-02-11 ENCOUNTER — Other Ambulatory Visit: Payer: Self-pay | Admitting: Internal Medicine

## 2022-02-11 DIAGNOSIS — Z794 Long term (current) use of insulin: Secondary | ICD-10-CM

## 2022-02-11 DIAGNOSIS — E1122 Type 2 diabetes mellitus with diabetic chronic kidney disease: Secondary | ICD-10-CM

## 2022-02-11 MED ORDER — TRULICITY 3 MG/0.5ML ~~LOC~~ SOAJ: SUBCUTANEOUS | 3 refills | Status: DC

## 2022-03-03 NOTE — Progress Notes (Unsigned)
MEDICARE ANNUAL WELLNESS   Assessment and Plan:  Encounter for Medicare annual wellness exam 1 year  Essential hypertension Continue current medications: Monitor blood pressure at home; call if consistently over 130/80 Continue DASH diet.   Reminder to go to the ER if any CP, SOB, nausea, dizziness, severe HA, changes vision/speech, left arm numbness and tingling and jaw pain. - CMP/GFR  OSA (obstructive sleep apnea) Weight loss advised, needs to wear nightly, may benefit from new mask/titration   Hypothyroidism, unspecified hypothyroidism type Taking levothyroxine 15mg daily Reminder to take on an empty stomach 30-640ms before first meal of the day. No antacid medications for 4 hours. 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   Hyperlipidemia associated with T2DM (HCC) Continue medications:Losartan 10019miscussed dietary and exercise modifications Low fat diet - Lipid panel   Vitamin D deficiency Continue supplementation to maintain goal of 70-100 Taking Vitamin D 5,00IU, 2 tabs daily Defer vitamin D level   Obesity, morbid Obesity with co morbidities- long discussion about weight loss, diet, and exercise  Medication management - Magnesium  CKD stage 3 due to type 2 diabetes mellitus Follows with nephrology, next OV two weeks Increase fluids, avoid NSAIDS, monitor sugars, will monitor  - CMP/GFR  Anxiety Will take valium prior to procedures but would like to be able to take AS needed for sleep, long discussion about addictive nature and tolerance, will only take PRN.   Macular degeneration, unspecified laterality, unspecified type  continue follow up eye doctor  Chronic gout without tophus, unspecified cause, unspecified site Gout- recheck Uric acid today , Diet discussed, continue medications.  Type 2 diabetes mellitus with hyperlipidemia (HCC) Continue 70/30, 25untis in am and 10unit PM. Rx Ozempic for glucose and added weight  benefit. Discussed general issues about diabetes pathophysiology and management., Educational material distributed., Suggested low cholesterol diet., Encouraged aerobic exercise., Discussed foot care., Reminded to get yearly retinal exam. -     Hemoglobin A1c    Further disposition pending results if labs check today. Discussed med's effects and SE's.   Over 30 minutes of face to face interview, exam, counseling, chart review, and critical decision making was performed.    Discussed med's effects and SE's. Screening labs and tests as requested with regular follow-up as recommended. Future Appointments  Date Time Provider [place]/14/2023 10:30 AM MulMagda BernheimP [facility] None  03/17/2022  8:15 AM MatHayden PedroD [facility] None  04/23/2022  9:00 AM LomDebbora PrestoP [facility] None  08/12/2022  9:00 AM CorLiane ComberP [facility] None    Plan:   During the course of the visit the patient was educated and counseled about appropriate screening and preventive services including:   Pneumococcal vaccine  Prevnar 13 Influenza vaccine Td vaccine Screening electrocardiogram Bone densitometry screening Colorectal cancer screening Diabetes screening Glaucoma screening Nutrition counseling  Advanced directives: requested   HPI  [age]o. AA female  presents for medicare wellness and 3 month follow up.   She has hx of venous stasis, recurrent LE ulcers, reports today has L shin ulcer. Has been applying betadine/sugar slurry.  Reports this has been increasing tenderness and some tightness.  Reports that she is not able to see the area.  Reports her husband looks at the areas. She denies discharge can't recall if yellow/purulent. Currently clear dressing over this.  She is getting shots for Mac Degen with Dr. MatWendelyn Breslowhe is prescribed valium to take prior to eye injections.   She has  OSA and does CPAP though admits to using sporadically/infrequently, states she falls  asleep in the den and forgets to put on. Discussed mask hygiene.    She is following with ortho Dr. Rip Harbour at [facility] for bilateral knee pain and has been advised may need TKA at some point. She had injections 6-8 months ago and did have relief until recently.  She will take tylenol only if needed, uses OTC topicals and takes tumeric/bioprene supplement daily. Reports that she is having and increase in discomfort that is constant and movement makes it worse, bilaterally.  She has not noticed any swelling but reports she can feel some tightness in the right knee.  She does use a cane for ambulation but left it in the car today.  BMI is There is no height or weight on file to calculate BMI., she has been working on diet, reducing white items/starches, and sweets. Exercise is admittedly limited due to knee.  Wt Readings from Last 3 Encounters:  11/11/21 250 lb 6.4 oz (113.6 kg)  10/24/21 250 lb (113.4 kg)  09/25/21 250 lb 6.4 oz (113.6 kg)   Her blood pressure has been controlled at home, today their BP is   She does not workout due to knee pain.  She denies chest pain, shortness of breath, dizziness.   She is on cholesterol medication and denies myalgias. Her cholesterol is at goal. The cholesterol last visit was:   Lab Results  Component Value Date   CHOL 119 11/11/2021   HDL 34 (L) 11/11/2021   LDLCALC 65 11/11/2021   TRIG 121 11/11/2021   CHOLHDL 3.5 11/11/2021   She has had diabetes for 3 years. She has been working on diet and exercise for Diabetes with CKD stage III, she is on ACE, she is on bASA, She is off metformin due to kidneys, she is on 70/30 insulin 25 units and 10 units and denies paresthesia of the feet, polydipsia and polyuria. She is checking her sugars, fasting ranging from 90-140, average 120. Checks sugars twice a day with one touch ultra.  Would like to use and would benefit from Ozempic added to her current regiment. Will place order today. Denies any  hypoglycemia. Last A1C in the office was:  Lab Results  Component Value Date   HGBA1C 5.8 (H) 08/08/2021   Follows with Nephrology, next OV in two weeks. She has mild edema since reducing dose, typically well managed by compression.  Lab Results  Component Value Date   GFRAA 44 (L) 03/04/2021   Patient is on Vitamin D supplement.   Lab Results  Component Value Date   VD25OH 89 08/06/2020     She is on thyroid medication. Her medication was not changed last visit. She is on 125 mcg daily. She takes it 30 mins before food with just water.  Lab Results  Component Value Date   TSH 3.36 11/11/2021   Patient is on allopurinol for gout and does not report a recent flare.  Lab Results  Component Value Date   LABURIC 5.9 11/11/2021     Current Medications:  Current Outpatient Medications on File Prior to Visit  Medication Sig Dispense Refill   allopurinol (ZYLOPRIM) 300 MG tablet TAKE 1/2 TABLET(150 MG) BY MOUTH DAILY FOR GOUT PREVENTION 45 tablet 2   ALPRAZolam (XANAX) 0.5 MG tablet Take 1 tablet (0.5 mg total) by mouth at bedtime as needed for anxiety. 2 tablet 0   Ascorbic Acid (VITAMIN C) 1000 MG tablet Take 1,000  mg by mouth daily.     aspirin EC 81 MG tablet Take 81 mg by mouth daily.     atenolol (TENORMIN) 100 MG tablet Take 1 tablet by mouth once daily for blood pressure 90 tablet 0   BLACK PEPPER-TURMERIC PO Take by mouth daily.     Blood Glucose Monitoring Suppl (ONE TOUCH ULTRA 2) w/Device KIT check blood sugar 3 times daily-DX-E11.21 1 kit 0   Cholecalciferol (VITAMIN D3) 5000 units CAPS Take 10,000 Units by mouth daily.     colchicine 0.6 MG tablet TAKE 1 TABLET BY MOUTH DAILY FOR GOUT PREVENTION 90 tablet 3   Cyanocobalamin (B-12) 1000 MCG SUBL Place 1 tablet under the tongue 2 (two) times a week.     dorzolamide-timolol (COSOPT) 22.3-6.8 MG/ML ophthalmic solution Place 1 drop into the left eye 2 (two) times daily.     doxycycline (VIBRAMYCIN) 100 MG capsule Take 1  capsule 2 x /day with meals for Infection 30 capsule 1   Dulaglutide (TRULICITY) 3 VQ/9.4HW SOPN Inject  3 MG(0.5 ML) into the skin  weekly for Diabetes (Dx: e10.29) 6 mL 3   Finerenone (KERENDIA) 10 MG TABS Take 10 mg by mouth daily. Take  1 tablet  Daily  for Diabetic Kidneys 90 tablet 0   furosemide (LASIX) 80 MG tablet TAKE 1 TABLET BY MOUTH 1 TO 2 TIMES PER DAY AS NEEDED FOR FLUID RETENTION OR ANKLE SWELLING 180 tablet 3   hyoscyamine (LEVSIN, ANASPAZ) 0.125 MG tablet Take 1 tablet (0.125 mg total) by mouth every 6 (six) hours as needed. 60 tablet 1   insulin NPH-regular Human (70-30) 100 UNIT/ML injection Patient injects 25 units in the AM and 10 units in the PM. Reduce each insulin dose by 2 units if glucose persistently <100 10 mL    Lancets (ONETOUCH ULTRASOFT) lancets Check blood sugar 3 times daily-DX-E10.9 300 each 1   levothyroxine (SYNTHROID) 125 MCG tablet Take  1 tablet  Daily  on an empty stomach with only water for 30 minutes & no Antacid meds, Calcium or Magnesium for 4 hours & avoid Biotin 90 tablet 3   ONETOUCH ULTRA test strip CHECK BLOOD SUGAR 3 TIMES A DAY 300 strip 3   polymyxin B 500,000 Units in sodium chloride irrigation 0.9 % 500 mL Irrigate with 1 application as directed every 30 (thirty) days. When getting her eye inj.     rosuvastatin (CRESTOR) 5 MG tablet Take 1 tablet once weekly as directed for Cholesterol LDL goal less than 70. 13 tablet 3   zinc gluconate 50 MG tablet Take 50 mg by mouth daily.     No current facility-administered medications on file prior to visit.   Health Maintenance:   Immunization History  Administered Date(s) Administered   DT (Pediatric) 06/19/2015   DTaP 12/09/2004   PFIZER Comirnaty(Gray Top)Covid-19 Tri-Sucrose Vaccine 04/18/2021   PFIZER(Purple Top)SARS-COV-2 Vaccination 02/03/2020, 02/28/2020, 10/09/2020   Pneumococcal Conjugate-13 12/19/2014   Pneumococcal Polysaccharide-23 03/02/2009, 01/17/2019   Last colonoscopy: 03/2014  normal Last mammogram: 11/2020 Last pap smear/pelvic exam: 2012, DONE DEXA: 11/2015 normal Carotid US 2013 CXR 2009  ECHO: 06/29/2018 - Aortic valve: Moderate focal calcification involving the left   coronary cusp. EF 38-88%, grade 1 diastolic dysfunction  Prior vaccinations: TD or Tdap: 2016      Influenza: declines Pneumococcal: 2010, 12/2018 Prevnar 13: 2015 Shingles/Zostavax: declines  Eye exam Dr. Zigmund Daniel sees q6w for injections, had diabetes eye report 06/14/2019 Report received and abstracted Dental: Dr. Cristi Loron, last exam  a few years ago, ? 2019, will schedule DUE   Foot exam: 08/04/2019   Patient Care Team: [name], MD as PCP - General (Internal Medicine) Warden Fillers, MD as Consulting Physician (Optometry) [name], MD as Consulting Physician (Ophthalmology) Inda Castle, MD (Inactive) as Consulting Physician (Gastroenterology) Druscilla Brownie, MD as Consulting Physician (Dermatology) Allyn Kenner, MD (Dermatology)   Medical History:  Past Medical History:  Diagnosis Date   Allergic rhinitis    Allergy    Anxiety    with medical procedures   Hyperlipidemia    Hypertension    Obesity    OSA (obstructive sleep apnea)    wears CPAP   Type II or unspecified type diabetes mellitus with renal manifestations, not stated as uncontrolled(250.40)    Unspecified hypothyroidism    Vitamin D deficiency    Allergies Allergies  Allergen Reactions   Penicillins Swelling   Sulfa Antibiotics Itching and Rash    SURGICAL HISTORY She  has a past surgical history that includes  eye procedure; Tubal ligation; and Dental examination under anesthesia. FAMILY HISTORY Her family history includes Breast cancer in her mother; Diabetes in her maternal grandmother and sister; Hypertension in her sister; Renal Disease in her mother. SOCIAL HISTORY She  reports that she has never smoked. She has never used smokeless tobacco. She reports that she does not  currently use alcohol. She reports that she does not use drugs.  MEDICARE WELLNESS OBJECTIVES: Physical activity:   Cardiac risk factors:   Depression/mood screen:   Depression screen [facility] 2/9 08/08/2021  Decreased Interest 0  Down, Depressed, Hopeless 0  PHQ - 2 Score 0  Some recent data might be hidden    ADLs:  In your present state of health, do you have any difficulty performing the following activities: 03/04/2021  Hearing? N  Vision? N  Difficulty concentrating or making decisions? N  Walking or climbing stairs? N  Dressing or bathing? N  Doing errands, shopping? N  Using the Toilet? N  In the past six months, have you accidently leaked urine? N  Do you have problems with loss of bowel control? N  Managing your Medications? N  Managing your Finances? N  Housekeeping or managing your Housekeeping? N  Some recent data might be hidden     Cognitive Testing  Alert? Yes  Normal Appearance?Yes  Oriented to person? Yes  Place? Yes   Time? Yes  Recall of three objects?  Yes  Can perform simple calculations? Yes  Displays appropriate judgment?Yes  Can read the correct time from a watch face?Yes  EOL planning:     Review of Systems  Constitutional: Negative.  Negative for malaise/fatigue and weight loss.  HENT: Negative.  Negative for hearing loss and tinnitus.   Eyes: Negative.  Negative for blurred vision and double vision.  Respiratory:  Positive for cough. Negative for shortness of breath and wheezing.   Cardiovascular: Negative.  Negative for chest pain, palpitations, orthopnea, claudication and leg swelling.  Gastrointestinal: Negative.  Negative for abdominal pain, blood in stool, constipation, diarrhea, heartburn, melena, nausea and vomiting.  Genitourinary: Negative.  Negative for dysuria, flank pain, frequency, hematuria and urgency.  Musculoskeletal:  Positive for joint pain (bilateral knee pain). Negative for myalgias.  Skin: Negative.  Negative for rash.   Neurological: Negative.  Negative for dizziness, tingling, sensory change, weakness and headaches.  Endo/Heme/Allergies: Negative.  Negative for polydipsia.  Psychiatric/Behavioral: Negative.  Negative for depression, hallucinations, memory loss, substance abuse and suicidal ideas.  The patient is not nervous/anxious and does not have insomnia.   All other systems reviewed and are negative.  Physical Exam: Estimated body mass index is 45.8 kg/m as calculated from the following:   Height as of 11/11/21: _0  (1.575 m).   Weight as of 11/11/21: 250 lb 6.4 oz (113.6 kg). There were no vitals taken for this visit. General Appearance: Well nourished, in no apparent distress.  Eyes: PERRLA, EOMs, conjunctiva no swelling or erythema, normal fundi and vessels.  Sinuses: No Frontal/maxillary tenderness  ENT/Mouth: Ext aud canals clear, normal light reflex with TMs without erythema, bulging. Good dentition. No erythema, swelling, or exudate on post pharynx. Tonsils not swollen or erythematous. Hearing normal.  Neck: Supple, thyroid normal. No bruits  Respiratory: Respiratory effort normal, BS equal bilaterally without rales, rhonchi, wheezing or stridor.  Cardio: RRR with normal S1/S2, without rubs or gallops. Brisk peripheral pulses with mild edema.  Chest: symmetric, with normal excursions and percussion.  Breasts: declines, states getting MGM Abdomen: Soft, nontender, no guarding, rebound, hernias, masses, or organomegaly. .  Lymphatics: Non tender without lymphadenopathy.  Genitourinary: defer Musculoskeletal: Full ROM all peripheral extremities, 5/5 strength, and antalgic gait.  Skin: Vitiligo bilateral hands and feet. Warm, dry without rashes, lesions, ecchymosis. She has open ulcerated wound to left shin, approx 2.5 x 1.5 cm area, breakdown to fatty layer, surrounding erythema, warm to touch, pitting noted around open area. No drainage or exudate noted. Covered with clear dressing. Neuro:  Cranial nerves intact, reflexes equal bilaterally. Normal muscle tone, no cerebellar symptoms. Sensation intact.  Psych: Awake and oriented X 3, normal affect, Insight and Judgment appropriate.    Medicare Attestation I have personally reviewed: The patient's medical and social history Their use of alcohol, tobacco or illicit drugs Their current medications and supplements The patient's functional ability including ADLs,fall risks, home safety risks, cognitive, and hearing and visual impairment Diet and physical activities Evidence for depression or mood disorders  The patient's weight, height, BMI, and visual acuity have been recorded in the chart.  I have made referrals, counseling, and provided education to the patient based on review of the above and I have provided the patient with a written personalized care plan for preventive services.     Garnet Sierras, Laqueta Jean, DNP [facility] 03/03/2022  12:08 PM

## 2022-03-04 ENCOUNTER — Other Ambulatory Visit: Payer: Self-pay

## 2022-03-04 ENCOUNTER — Ambulatory Visit (INDEPENDENT_AMBULATORY_CARE_PROVIDER_SITE_OTHER): Payer: Medicare Other | Admitting: Nurse Practitioner

## 2022-03-04 ENCOUNTER — Encounter: Payer: Self-pay | Admitting: Nurse Practitioner

## 2022-03-04 VITALS — BP 112/72 | HR 89 | Temp 97.5°F | Wt 240.2 lb

## 2022-03-04 DIAGNOSIS — I10 Essential (primary) hypertension: Secondary | ICD-10-CM

## 2022-03-04 DIAGNOSIS — E11319 Type 2 diabetes mellitus with unspecified diabetic retinopathy without macular edema: Secondary | ICD-10-CM | POA: Diagnosis not present

## 2022-03-04 DIAGNOSIS — R6889 Other general symptoms and signs: Secondary | ICD-10-CM | POA: Diagnosis not present

## 2022-03-04 DIAGNOSIS — I83892 Varicose veins of left lower extremities with other complications: Secondary | ICD-10-CM

## 2022-03-04 DIAGNOSIS — F411 Generalized anxiety disorder: Secondary | ICD-10-CM

## 2022-03-04 DIAGNOSIS — E039 Hypothyroidism, unspecified: Secondary | ICD-10-CM

## 2022-03-04 DIAGNOSIS — G4733 Obstructive sleep apnea (adult) (pediatric): Secondary | ICD-10-CM

## 2022-03-04 DIAGNOSIS — M1A9XX0 Chronic gout, unspecified, without tophus (tophi): Secondary | ICD-10-CM

## 2022-03-04 DIAGNOSIS — Z0000 Encounter for general adult medical examination without abnormal findings: Secondary | ICD-10-CM

## 2022-03-04 DIAGNOSIS — E1169 Type 2 diabetes mellitus with other specified complication: Secondary | ICD-10-CM

## 2022-03-04 DIAGNOSIS — H3530 Unspecified macular degeneration: Secondary | ICD-10-CM

## 2022-03-04 DIAGNOSIS — N2581 Secondary hyperparathyroidism of renal origin: Secondary | ICD-10-CM | POA: Diagnosis not present

## 2022-03-04 DIAGNOSIS — E1122 Type 2 diabetes mellitus with diabetic chronic kidney disease: Secondary | ICD-10-CM

## 2022-03-04 DIAGNOSIS — Z79899 Other long term (current) drug therapy: Secondary | ICD-10-CM

## 2022-03-04 DIAGNOSIS — E66813 Obesity, class 3: Secondary | ICD-10-CM

## 2022-03-04 DIAGNOSIS — Z0001 Encounter for general adult medical examination with abnormal findings: Secondary | ICD-10-CM | POA: Diagnosis not present

## 2022-03-04 DIAGNOSIS — E559 Vitamin D deficiency, unspecified: Secondary | ICD-10-CM

## 2022-03-04 DIAGNOSIS — Z794 Long term (current) use of insulin: Secondary | ICD-10-CM

## 2022-03-04 MED ORDER — DOXYCYCLINE HYCLATE 100 MG PO CAPS: 100.0000 mg | ORAL_CAPSULE | Freq: Two times a day (BID) | ORAL | 1 refills | Status: DC

## 2022-03-04 NOTE — Patient Instructions (Signed)
Apply betadine sugar with dressing daily ?Doxycycline 100 mg twice a day for 7 days  ?Follow up in 2 weeks to reevaluate ? ?Venous Ulcer ?A venous ulcer is a shallow sore on your lower leg that is caused by poor circulation in your veins. This condition used to be called stasis ulcer. ?Venous ulcer is the most common type of lower leg ulcer. You may have venous ulcers on one leg or on both legs. The area where this condition most commonly develops is around the ankles. A venous ulcer may last for a long time (chronic ulcer) or it may return repeatedly (recurrent ulcer). ?What are the causes? ?A venous ulcer may be caused by any condition that causes poor blood flow in your legs. Veins have valves that help return blood to the heart. If these valves do not work properly: ?Blood can flow backward and pool in the lower legs. ?Blood can then leak out of your veins, which can irritate your skin. ?Irritation can cause a break in the skin, which becomes a venous ulcer. ?What increases the risk? ?You are more likely to develop this condition if you: ?Are 32 years of age or older. ?Are female. ?Are overweight. ?Are not active. ?Have had a leg ulcer in the past. ?Have varicose veins. ?Have clots in your lower leg veins (deep vein thrombosis). ?Have inflammation of your leg veins (phlebitis). ?Have recently had a pregnancy. ?Use products that contain nicotine or tobacco. ?What are the signs or symptoms? ?The main symptom of this condition is an open sore near your ankle. Other symptoms may include: ?Swelling. ?Thickening of the skin. ?Fluid leaking from the ulcer. ?Bleeding. ?Itching. ?Pain and swelling that gets worse when you stand up and feels better when you raise your leg. ?Blotchy skin. ?Darkening of the skin. ?How is this diagnosed? ?Your health care provider may suspect a venous ulcer based on your medical history and your risk factors. He or she may: ?Do a physical exam. ?Do other tests, such as: ?Measuring blood  pressure in your arms and legs. ?Using sound waves (ultrasound) to measure blood flow in your leg veins. ?How is this treated? ?This condition may be treated by: ?Keeping your leg raised (elevated). ?Wearing a type of bandage or stocking to compress the veins of your leg (compression therapy). ?Taking medicines to improve blood flow. ?Taking antibiotic medicines to treat infection. ?Cleaning your ulcer and removing any dead tissue from the wound (debridement). ?Placing various types of medicated bandages (dressings) or medicated wraps on your ulcer. ?Surgery to close the wound using a piece of skin taken from another area of your body (graft). This is only done for wounds that are deep or hard to heal. ?You may need to try several different types of treatment to get your venous ulcer to heal. Healing may take a long time. ?Follow these instructions at home: ?Medicines ?Take or apply over-the-counter and prescription medicines only as told by your health care provider. ?If you were prescribed an antibiotic medicine, take it as told by your health care provider. Do not stop using the antibiotic even if you start to feel better. ?Ask your health care provider if you should take aspirin before long trips. ?Wound care ?Follow instructions from your health care provider about how to take care of your wound. Make sure you: ?Wash your hands with soap and water before and after you change your bandage (dressing). If soap and water are not available, use hand sanitizer. ?Change your dressing as told by  your health care provider. ?If you had a skin graft, leave stitches (sutures) in place. These may need to stay in place for 2 weeks or longer. ?Ask when you should remove your dressing. If your dressing is dry and sticks to your leg when you try to remove it, moisten or wet the dressing with saline solution or water so that the dressing can be removed without harming your skin or wound tissue. ?When you are able to remove your  dressing, check your wound every day for signs of infection. Have a caregiver do this for you if you are not able to do it yourself. Check for: ?More redness, swelling, or pain. ?More fluid or blood. ?Warmth. ?Pus or a bad smell. ?Activity ?Avoid sitting for a long time without moving. Get up to take short walks every 1-2 hours. This is important to improve blood flow in your legs. Ask for help if you feel weak or unsteady. ?Ask your health care provider what level of activity is safe for you. ?Rest with your legs raised (elevated) during the day. If possible, elevate your legs above the level of your heart for 30 minutes, 3-4 times a day, or as told by your health care provider. ?Do not sit with your legs crossed. ?General instructions ? ?Wear elastic stockings, compression stockings, or support hose as told by your health care provider. ?Raise the foot of your bed as told by your health care provider. ?Do not use any products that contain nicotine or tobacco, such as cigarettes, e-cigarettes, and chewing tobacco. If you need help quitting, ask your health care provider. ?Keep all follow-up visits as told by your health care provider. This is important. ?Contact a health care provider if: ?Your ulcer is getting larger or is not healing. ?Your pain gets worse. ?Get help right away if you have: ?More redness, swelling, or pain around your ulcer. ?More fluid or blood coming from your ulcer. ?Warmth in the area around your ulcer. ?Pus or a bad smell coming from your ulcer. ?A fever. ?Summary ?A venous ulcer is a shallow sore on your lower leg that is caused by poor circulation in your veins. ?Follow instructions from your health care provider about how to take care of your wound. ?Check your wound every day for signs of infection. ?Take over-the-counter and prescription medicines only as told by your health care provider. ?Keep all follow-up visits as told by your health care provider. This is important. ?This  information is not intended to replace advice given to you by your health care provider. Make sure you discuss any questions you have with your health care provider. ?Document Revised: 08/05/2018 Document Reviewed: 08/05/2018 ?Elsevier Patient Education ? [place]. ? ?

## 2022-03-05 LAB — CBC WITH DIFFERENTIAL/PLATELET
Absolute Monocytes: 650 cells/uL (ref 200–950)
Basophils Absolute: 49 cells/uL (ref 0–200)
Basophils Relative: 0.5 %
Eosinophils Absolute: 155 cells/uL (ref 15–500)
Eosinophils Relative: 1.6 %
HCT: 32.6 % — ABNORMAL LOW (ref 35.0–45.0)
Hemoglobin: 10.7 g/dL — ABNORMAL LOW (ref 11.7–15.5)
Lymphs Abs: 2270 cells/uL (ref 850–3900)
MCH: 29.2 pg (ref 27.0–33.0)
MCHC: 32.8 g/dL (ref 32.0–36.0)
MCV: 88.8 fL (ref 80.0–100.0)
MPV: 12 fL (ref 7.5–12.5)
Monocytes Relative: 6.7 %
Neutro Abs: 6577 cells/uL (ref 1500–7800)
Neutrophils Relative %: 67.8 %
Platelets: 307 10*3/uL (ref 140–400)
RBC: 3.67 10*6/uL — ABNORMAL LOW (ref 3.80–5.10)
RDW: 14.2 % (ref 11.0–15.0)
Total Lymphocyte: 23.4 %
WBC: 9.7 10*3/uL (ref 3.8–10.8)

## 2022-03-05 LAB — COMPLETE METABOLIC PANEL WITH GFR
AG Ratio: 1.6 (calc) (ref 1.0–2.5)
ALT: 20 U/L (ref 6–29)
AST: 20 U/L (ref 10–35)
Albumin: 4.1 g/dL (ref 3.6–5.1)
Alkaline phosphatase (APISO): 77 U/L (ref 37–153)
BUN/Creatinine Ratio: 21 (calc) (ref 6–22)
BUN: 30 mg/dL — ABNORMAL HIGH (ref 7–25)
CO2: 26 mmol/L (ref 20–32)
Calcium: 9.2 mg/dL (ref 8.6–10.4)
Chloride: 105 mmol/L (ref 98–110)
Creat: 1.41 mg/dL — ABNORMAL HIGH (ref 0.60–1.00)
Globulin: 2.6 g/dL (calc) (ref 1.9–3.7)
Glucose, Bld: 103 mg/dL — ABNORMAL HIGH (ref 65–99)
Potassium: 4.1 mmol/L (ref 3.5–5.3)
Sodium: 143 mmol/L (ref 135–146)
Total Bilirubin: 0.7 mg/dL (ref 0.2–1.2)
Total Protein: 6.7 g/dL (ref 6.1–8.1)
eGFR: 39 mL/min/{1.73_m2} — ABNORMAL LOW (ref >=60)

## 2022-03-05 LAB — LIPID PANEL
Cholesterol: 104 mg/dL (ref <200)
HDL: 34 mg/dL — ABNORMAL LOW (ref >=50)
LDL Cholesterol (Calc): 49 mg/dL (calc)
Non-HDL Cholesterol (Calc): 70 mg/dL (calc) (ref <130)
Total CHOL/HDL Ratio: 3.1 (calc) (ref <5.0)
Triglycerides: 130 mg/dL (ref <150)

## 2022-03-05 LAB — PTH, INTACT AND CALCIUM
Calcium: 9.2 mg/dL (ref 8.6–10.4)
PTH: 205 pg/mL — ABNORMAL HIGH (ref 16–77)

## 2022-03-05 LAB — HEMOGLOBIN A1C
Hgb A1c MFr Bld: 5.2 % of total Hgb (ref <5.7)
Mean Plasma Glucose: 103 mg/dL
eAG (mmol/L): 5.7 mmol/L

## 2022-03-05 LAB — TSH: TSH: 1.18 mIU/L (ref 0.40–4.50)

## 2022-03-17 ENCOUNTER — Other Ambulatory Visit: Payer: Self-pay

## 2022-03-17 ENCOUNTER — Encounter (INDEPENDENT_AMBULATORY_CARE_PROVIDER_SITE_OTHER): Payer: Medicare Other | Admitting: Ophthalmology

## 2022-03-17 DIAGNOSIS — I10 Essential (primary) hypertension: Secondary | ICD-10-CM

## 2022-03-17 DIAGNOSIS — H43813 Vitreous degeneration, bilateral: Secondary | ICD-10-CM

## 2022-03-17 DIAGNOSIS — H35033 Hypertensive retinopathy, bilateral: Secondary | ICD-10-CM | POA: Diagnosis not present

## 2022-03-17 DIAGNOSIS — H34812 Central retinal vein occlusion, left eye, with macular edema: Secondary | ICD-10-CM | POA: Diagnosis not present

## 2022-03-18 NOTE — Progress Notes (Signed)
Assessment and Plan: ? ?Jocelyn Camacho was seen today for follow-up. ? ?Diagnoses and all orders for this visit: ? ?Venous stasis of both lower extremities/Venous stasis ulcer of left lower leg with edema of left lower leg (HCC) ?Continue betadine sugar and tegaderm daily ?Continue compression socks ?Elevate legs above heart as much as she can when sitting ?If develop fever, redness surrounding worsens or starts to have drainage notify the office ? ?Type 2 diabetes mellitus with stage 3b chronic kidney disease, with long-term current use of insulin ([place]) ?Continue 70/30, 25untis in am and 10unit PM. ?Trulicity 3 mg SQ QW. ?Discussed general issues about diabetes pathophysiology and management., Educational material distributed., Suggested low cholesterol diet., Encouraged aerobic exercise., Discussed foot care., Reminded to get yearly retinal exam. ? ?Class 3 severe obesity due to excess calories with serious comorbidity and body mass index (BMI) of 45.0 to 49.9 in adult [facility]) ?Continue to work on diet and increasing activity ?Continue Trulicity ?  ? ? ? ?Further disposition pending results of labs. Discussed med's effects and SE's.   ?Over 30 minutes of exam, counseling, chart review, and critical decision making was performed.  ? ?Future Appointments  ?Date Time Provider [place]  ?03/19/2022 10:30 AM Shamiyah Ngu, Townsend Roger, NP [facility] None  ?04/21/2022  8:15 AM [name], MD [facility] None  ?04/23/2022  9:00 AM Lomax, Amy, NP [facility] None  ?08/12/2022  9:00 AM [name], NP [facility] None  ?03/05/2023 10:00 AM Shyane Fossum, Townsend Roger, NP [facility] None  ? ? ?------------------------------------------------------------------------------------------------------------------ ? ? ?HPI ?BP 112/60   Pulse 92   Temp (!) 97.5 ?F (36.4 ?C)   Wt 240 lb 12.8 oz (109.2 kg)   SpO2 98%   BMI 44.04 kg/m?  ?76 y.o.female presents for reevaluation of left lower extremity venous stasis ulcer.  Has been applying betadine sugar and tegaderm  dressing daily.   ? ?BMI is Body mass index is 44.04 kg/m?., she has been working on diet and exercise. ?Wt Readings from Last 3 Encounters:  ?03/19/22 240 lb 12.8 oz (109.2 kg)  ?03/04/22 240 lb 3.2 oz (109 kg)  ?11/11/21 250 lb 6.4 oz (113.6 kg)  ? ?She has had diabetes for 3 years. She has been working on diet and exercise for Diabetes with CKD stage III, she is on ACE, she is on bASA, She is off metformin due to kidneys, she is on 70/30 insulin 20 units and 5-8 units and Trulicity 3 mg QW denies paresthesia of the feet, polydipsia and polyuria. She is checking her sugars, fasting runs in 100's, was 112 this morning ?Lab Results  ?Component Value Date  ? HGBA1C 5.2 03/04/2022  ?  ? ?Past Medical History:  ?Diagnosis Date  ? Allergic rhinitis   ? Allergy   ? Anxiety   ? with medical procedures  ? Hyperlipidemia   ? Hypertension   ? Obesity   ? OSA (obstructive sleep apnea)   ? wears CPAP  ? Type II or unspecified type diabetes mellitus with renal manifestations, not stated as uncontrolled(250.40)   ? Unspecified hypothyroidism   ? Vitamin D deficiency   ?  ? ?Allergies  ?Allergen Reactions  ? Penicillins Swelling  ? Sulfa Antibiotics Itching and Rash  ? ? ?Current Outpatient Medications on File Prior to Visit  ?Medication Sig  ? allopurinol (ZYLOPRIM) 300 MG tablet TAKE 1/2 TABLET(150 MG) BY MOUTH DAILY FOR GOUT PREVENTION  ? ALPRAZolam (XANAX) 0.5 MG tablet Take 1 tablet (0.5 mg total) by mouth at bedtime  as needed for anxiety.  ? Ascorbic Acid (VITAMIN C) 1000 MG tablet Take 1,000 mg by mouth daily.  ? aspirin EC 81 MG tablet Take 81 mg by mouth daily.  ? atenolol (TENORMIN) 100 MG tablet Take 1 tablet by mouth once daily for blood pressure  ? BLACK PEPPER-TURMERIC PO Take by mouth daily.  ? Blood Glucose Monitoring Suppl (ONE TOUCH ULTRA 2) w/Device KIT check blood sugar 3 times daily-DX-E11.21  ? Cholecalciferol (VITAMIN D3) 5000 units CAPS Take 10,000 Units by mouth daily. Taking 5,000 units  ? colchicine 0.6  MG tablet TAKE 1 TABLET BY MOUTH DAILY FOR GOUT PREVENTION  ? Cyanocobalamin (B-12) 1000 MCG SUBL Place 1 tablet under the tongue 2 (two) times a week.  ? dorzolamide-timolol (COSOPT) 22.3-6.8 MG/ML ophthalmic solution Place 1 drop into the left eye 2 (two) times daily.  ? doxycycline (VIBRAMYCIN) 100 MG capsule Take 1 capsule (100 mg total) by mouth 2 (two) times daily.  ? Dulaglutide (TRULICITY) 3 WY/6.1UO SOPN Inject  3 MG(0.5 ML) into the skin  weekly for Diabetes (Dx: e10.29)  ? Finerenone (KERENDIA) 10 MG TABS Take 10 mg by mouth daily. Take  1 tablet  Daily  for Diabetic Kidneys  ? furosemide (LASIX) 80 MG tablet TAKE 1 TABLET BY MOUTH 1 TO 2 TIMES PER DAY AS NEEDED FOR FLUID RETENTION OR ANKLE SWELLING  ? hyoscyamine (LEVSIN, ANASPAZ) 0.125 MG tablet Take 1 tablet (0.125 mg total) by mouth every 6 (six) hours as needed.  ? insulin NPH-regular Human (70-30) 100 UNIT/ML injection Patient injects 25 units in the AM and 10 units in the PM. Reduce each insulin dose by 2 units if glucose persistently <100  ? Lancets (ONETOUCH ULTRASOFT) lancets Check blood sugar 3 times daily-DX-E10.9  ? levothyroxine (SYNTHROID) 125 MCG tablet Take  1 tablet  Daily  on an empty stomach with only water for 30 minutes & no Antacid meds, Calcium or Magnesium for 4 hours & avoid Biotin  ? ONETOUCH ULTRA test strip CHECK BLOOD SUGAR 3 TIMES A DAY  ? polymyxin B 500,000 Units in sodium chloride irrigation 0.9 % 500 mL Irrigate with 1 application as directed every 30 (thirty) days. When getting her eye inj.  ? rosuvastatin (CRESTOR) 5 MG tablet Take 1 tablet once weekly as directed for Cholesterol LDL goal less than 70.  ? zinc gluconate 50 MG tablet Take 50 mg by mouth daily.  ? ?No current facility-administered medications on file prior to visit.  ? ? ?ROS: all negative except above.  ? ?Physical Exam: ? ?BP 112/60   Pulse 92   Temp (!) 97.5 ?F (36.4 ?C)   Wt 240 lb 12.8 oz (109.2 kg)   SpO2 98%   BMI 44.04 kg/m?  ?General  Appearance: Very pleasant obese female in no apparent distress.  ?Eyes: PERRLA, EOMs, conjunctiva no swelling or erythema, normal fundi and vessels.  ?Sinuses: No Frontal/maxillary tenderness  ?ENT/Mouth: Ext aud canals clear, normal light reflex with TMs without erythema, bulging. Good dentition. No erythema, swelling, or exudate on post pharynx. Tonsils not swollen or erythematous. Hearing normal.  ?Neck: Supple, thyroid normal. No bruits  ?Respiratory: Respiratory effort normal, BS equal bilaterally without rales, rhonchi, wheezing or stridor.  ?Cardio: RRR with normal S1/S2, without rubs or gallops. Brisk peripheral pulses with 1+ nonpitting  edema.  ?Chest: symmetric, with normal excursions and percussion.  ?Breasts: declines, states getting MGM ?Abdomen: Soft, nontender, no guarding, rebound, hernias, masses, or organomegaly. Marland Kitchen  ?Lymphatics: Non tender  without lymphadenopathy.  ?Genitourinary: defer ?Musculoskeletal: Full ROM all peripheral extremities, 5/5 strength, and antalgic gait.  ?Skin: Vitiligo bilateral hands and feet. Warm, dry without rashes, lesions, ecchymosis. She has open ulcerated wound to left shin, approx 2.5 x 1.5 cm area, breakdown to fatty layer, surrounding erythema has decreased, middle of area is drying. Betadine sugar ap[plied and Covered with clear dressing. ?Neuro: Cranial nerves intact, reflexes equal bilaterally. Normal muscle tone, no cerebellar symptoms. Sensation intact.  ?Psych: Awake and oriented X 3, normal affect, Insight and Judgment appropriate. ? ?  ? ?Magda Bernheim, NP ?10:23 AM ?[facility]  ?

## 2022-03-19 ENCOUNTER — Other Ambulatory Visit: Payer: Self-pay | Admitting: Adult Health Nurse Practitioner

## 2022-03-19 ENCOUNTER — Encounter: Payer: Self-pay | Admitting: Nurse Practitioner

## 2022-03-19 ENCOUNTER — Other Ambulatory Visit: Payer: Self-pay

## 2022-03-19 ENCOUNTER — Ambulatory Visit (INDEPENDENT_AMBULATORY_CARE_PROVIDER_SITE_OTHER): Payer: Medicare Other | Admitting: Nurse Practitioner

## 2022-03-19 VITALS — BP 112/60 | HR 92 | Temp 97.5°F | Wt 240.8 lb

## 2022-03-19 DIAGNOSIS — E1122 Type 2 diabetes mellitus with diabetic chronic kidney disease: Secondary | ICD-10-CM

## 2022-03-19 DIAGNOSIS — I83892 Varicose veins of left lower extremities with other complications: Secondary | ICD-10-CM

## 2022-03-19 DIAGNOSIS — I83029 Varicose veins of left lower extremity with ulcer of unspecified site: Secondary | ICD-10-CM

## 2022-03-19 DIAGNOSIS — I878 Other specified disorders of veins: Secondary | ICD-10-CM

## 2022-03-19 DIAGNOSIS — R609 Edema, unspecified: Secondary | ICD-10-CM

## 2022-03-19 DIAGNOSIS — L97929 Non-pressure chronic ulcer of unspecified part of left lower leg with unspecified severity: Secondary | ICD-10-CM | POA: Diagnosis not present

## 2022-03-19 DIAGNOSIS — Z794 Long term (current) use of insulin: Secondary | ICD-10-CM

## 2022-03-19 DIAGNOSIS — N1832 Chronic kidney disease, stage 3b: Secondary | ICD-10-CM

## 2022-04-04 ENCOUNTER — Other Ambulatory Visit: Payer: Self-pay | Admitting: Adult Health Nurse Practitioner

## 2022-04-04 NOTE — Progress Notes (Addendum)
Assessment and Plan: ? ?Catori was seen today for 2 week follow-up. ? ?Diagnoses and all orders for this visit: ? ?Venous stasis ulcer of left lower leg with edema of left lower leg (HCC) ?-     Ambulatory referral to Wound clinic- has appointment tomorrow at 1:30 ?-  Keep current dressing on until evaluated ?- Restart Doxycycline as directed ?Continue betadine sugar dressing change daily until evaluated at wound clinic ?-     doxycycline (VIBRAMYCIN) 100 MG capsule; Take 1 capsule (100 mg total) by mouth 2 (two) times daily. ? ?Essential hypertension ?- continue Atenolol 100 mg QD and Losartan 100 mg QD, DASH diet, exercise and monitor at home. Call if greater than 130/80.   ?Go to the ER if any chest pain, shortness of breath, nausea, dizziness, severe HA, changes vision/speech  ?-     losartan (COZAAR) 100 MG tablet; Take 1 tablet (100 mg total) by mouth daily. ? ?  ? ? ? ?Further disposition pending results of labs. Discussed med's effects and SE's.   ?Over 30 minutes of exam, counseling, chart review, and critical decision making was performed.  ? ?Future Appointments  ?Date Time Provider [place]  ?04/21/2022  8:15 AM [name], MD [facility] None  ?04/23/2022  9:00 AM Lomax, Amy, NP [facility] None  ?08/12/2022  9:00 AM [name], NP [facility] None  ?03/05/2023 10:00 AM Boubacar Lerette, Townsend Roger, NP [facility] None  ? ? ?------------------------------------------------------------------------------------------------------------------ ? ? ?HPI ?BP 134/82   Pulse 84   Temp 97.6 ?F (36.4 ?C)   Wt 235 lb 6.4 oz (106.8 kg)   SpO2 97%   BMI 43.06 kg/m?  ? ?76 y.o.female presents for reevaluation of left lower extremity venous stasis ulcer.  She has been applying betadine sugar changed daily. The area has increased in size and now has an area above the first lesion. Main lesion is 6 X 7 cm and second lesion above is 6 cm x 1 cm. Granulation appearing tissue is in both lesions.  Small amount of Serosanguineous  drainage from main lesion.  ? ?Past Medical History:  ?Diagnosis Date  ? Allergic rhinitis   ? Allergy   ? Anxiety   ? with medical procedures  ? Hyperlipidemia   ? Hypertension   ? Obesity   ? OSA (obstructive sleep apnea)   ? wears CPAP  ? Type II or unspecified type diabetes mellitus with renal manifestations, not stated as uncontrolled(250.40)   ? Unspecified hypothyroidism   ? Vitamin D deficiency   ?  ? ?Allergies  ?Allergen Reactions  ? Penicillins Swelling  ? Sulfa Antibiotics Itching and Rash  ? ? ?Current Outpatient Medications on File Prior to Visit  ?Medication Sig  ? allopurinol (ZYLOPRIM) 300 MG tablet TAKE 1/2 TABLET(150 MG) BY MOUTH DAILY FOR GOUT PREVENTION  ? ALPRAZolam (XANAX) 0.5 MG tablet Take 1 tablet (0.5 mg total) by mouth at bedtime as needed for anxiety.  ? Ascorbic Acid (VITAMIN C) 1000 MG tablet Take 1,000 mg by mouth daily.  ? aspirin EC 81 MG tablet Take 81 mg by mouth daily.  ? atenolol (TENORMIN) 100 MG tablet Take 1 tablet by mouth once daily for blood pressure  ? BLACK PEPPER-TURMERIC PO Take by mouth daily.  ? Cholecalciferol (VITAMIN D3) 5000 units CAPS Take 10,000 Units by mouth daily. Taking 5,000 units  ? colchicine 0.6 MG tablet TAKE 1 TABLET BY MOUTH DAILY FOR GOUT PREVENTION  ? Cyanocobalamin (B-12) 1000 MCG SUBL Place 1 tablet under  the tongue 2 (two) times a week.  ? dorzolamide-timolol (COSOPT) 22.3-6.8 MG/ML ophthalmic solution Place 1 drop into the left eye 2 (two) times daily.  ? Dulaglutide (TRULICITY) 3 ML/4.6TK SOPN Inject  3 MG(0.5 ML) into the skin  weekly for Diabetes (Dx: e10.29)  ? Finerenone (KERENDIA) 10 MG TABS Take 10 mg by mouth daily. Take  1 tablet  Daily  for Diabetic Kidneys  ? furosemide (LASIX) 80 MG tablet TAKE 1 TABLET BY MOUTH 1 TO 2 TIMES PER DAY AS NEEDED FOR FLUID RETENTION OR ANKLE SWELLING  ? hyoscyamine (LEVSIN, ANASPAZ) 0.125 MG tablet Take 1 tablet (0.125 mg total) by mouth every 6 (six) hours as needed.  ? insulin NPH-regular Human  (70-30) 100 UNIT/ML injection Patient injects 25 units in the AM and 10 units in the PM. Reduce each insulin dose by 2 units if glucose persistently <100  ? levothyroxine (SYNTHROID) 125 MCG tablet Take  1 tablet  Daily  on an empty stomach with only water for 30 minutes & no Antacid meds, Calcium or Magnesium for 4 hours & avoid Biotin  ? polymyxin B 500,000 Units in sodium chloride irrigation 0.9 % 500 mL Irrigate with 1 application as directed every 30 (thirty) days. When getting her eye inj.  ? rosuvastatin (CRESTOR) 5 MG tablet Take 1 tablet once weekly as directed for Cholesterol LDL goal less than 70.  ? zinc gluconate 50 MG tablet Take 50 mg by mouth daily.  ? Blood Glucose Monitoring Suppl (ONE TOUCH ULTRA 2) w/Device KIT check blood sugar 3 times daily-DX-E11.21  ? Lancets (ONETOUCH ULTRASOFT) lancets Check blood sugar 3 times daily-DX-E10.9  ? ONETOUCH ULTRA test strip CHECK BLOOD SUGAR 3 TIMES A DAY  ? ?No current facility-administered medications on file prior to visit.  ? ? ?ROS: all negative except above.  ? ?Physical Exam: ? ?BP 134/82   Pulse 84   Temp 97.6 ?F (36.4 ?C)   Wt 235 lb 6.4 oz (106.8 kg)   SpO2 97%   BMI 43.06 kg/m?  ? ?General Appearance: Ederly adult obese female, in no apparent distress. ?Eyes: PERRLA, EOMs, conjunctiva no swelling or erythema ?Sinuses: No Frontal/maxillary tenderness ?ENT/Mouth: Ext aud canals clear, TMs without erythema, bulging. No erythema, swelling, or exudate on post pharynx.  Tonsils not swollen or erythematous. Hearing normal.  ?Neck: Supple, thyroid normal.  ?Respiratory: Respiratory effort normal, BS equal bilaterally without rales, rhonchi, wheezing or stridor.  ?Cardio: RRR with no MRGs. Brisk peripheral pulses without edema.  ?Abdomen: Soft, + BS.  Non tender, no guarding, rebound, hernias, masses. ?Lymphatics: Non tender without lymphadenopathy.  ?Musculoskeletal: Full ROM, slow steady antalgic gait with cane ?Skin: Warm, dry . LLE venous stasis  ulcer area has increased in size and now has an area above the first lesion. Main lesion is 6 X 7 cm and second lesion above is 6 cm x 1 cm. Granulation appearing tissue is in both lesions. Surrounding erythema, slight warmth to touch ?Neuro: Cranial nerves intact. Normal muscle tone, no cerebellar symptoms. Sensation intact.  ?Psych: Awake and oriented X 3, normal affect, Insight and Judgment appropriate.  ?  ? ?Magda Bernheim, NP ?11:52 AM ?[facility] ? ?

## 2022-04-07 ENCOUNTER — Encounter: Payer: Self-pay | Admitting: Nurse Practitioner

## 2022-04-07 ENCOUNTER — Ambulatory Visit (INDEPENDENT_AMBULATORY_CARE_PROVIDER_SITE_OTHER): Payer: Medicare Other | Admitting: Nurse Practitioner

## 2022-04-07 VITALS — BP 134/82 | HR 84 | Temp 97.6°F | Wt 235.4 lb

## 2022-04-07 DIAGNOSIS — I83892 Varicose veins of left lower extremities with other complications: Secondary | ICD-10-CM

## 2022-04-07 DIAGNOSIS — I10 Essential (primary) hypertension: Secondary | ICD-10-CM

## 2022-04-07 DIAGNOSIS — I83029 Varicose veins of left lower extremity with ulcer of unspecified site: Secondary | ICD-10-CM

## 2022-04-07 DIAGNOSIS — L97929 Non-pressure chronic ulcer of unspecified part of left lower leg with unspecified severity: Secondary | ICD-10-CM

## 2022-04-07 DIAGNOSIS — R600 Localized edema: Secondary | ICD-10-CM

## 2022-04-07 MED ORDER — LOSARTAN POTASSIUM 100 MG PO TABS: 100.0000 mg | ORAL_TABLET | Freq: Every day | ORAL | 2 refills | Status: DC

## 2022-04-07 MED ORDER — DOXYCYCLINE HYCLATE 100 MG PO CAPS: 100.0000 mg | ORAL_CAPSULE | Freq: Two times a day (BID) | ORAL | 1 refills | Status: DC

## 2022-04-07 NOTE — Patient Instructions (Signed)
Venous Ulcer A venous ulcer is a shallow sore on your lower leg that is caused by poor circulation in your veins. This condition used to be called stasis ulcer. Venous ulcer is the most common type of lower leg ulcer. You may have venous ulcers on one leg or on both legs. The area where this condition most commonly develops is around the ankles. A venous ulcer may last for a long time (chronic ulcer) or it may return repeatedly (recurrent ulcer). What are the causes? A venous ulcer may be caused by any condition that causes poor blood flow in your legs. Veins have valves that help return blood to the heart. If these valves do not work properly: Blood can flow backward and pool in the lower legs. Blood can then leak out of your veins, which can irritate your skin. Irritation can cause a break in the skin, which becomes a venous ulcer. What increases the risk? You are more likely to develop this condition if you: Are 65 years of age or older. Are female. Are overweight. Are not active. Have had a leg ulcer in the past. Have varicose veins. Have clots in your lower leg veins (deep vein thrombosis). Have inflammation of your leg veins (phlebitis). Have recently had a pregnancy. Use products that contain nicotine or tobacco. What are the signs or symptoms? The main symptom of this condition is an open sore near your ankle. Other symptoms may include: Swelling. Thickening of the skin. Fluid leaking from the ulcer. Bleeding. Itching. Pain and swelling that gets worse when you stand up and feels better when you raise your leg. Blotchy skin. Darkening of the skin. How is this diagnosed? Your health care provider may suspect a venous ulcer based on your medical history and your risk factors. He or she may: Do a physical exam. Do other tests, such as: Measuring blood pressure in your arms and legs. Using sound waves (ultrasound) to measure blood flow in your leg veins. How is this  treated? This condition may be treated by: Keeping your leg raised (elevated). Wearing a type of bandage or stocking to compress the veins of your leg (compression therapy). Taking medicines to improve blood flow. Taking antibiotic medicines to treat infection. Cleaning your ulcer and removing any dead tissue from the wound (debridement). Placing various types of medicated bandages (dressings) or medicated wraps on your ulcer. Surgery to close the wound using a piece of skin taken from another area of your body (graft). This is only done for wounds that are deep or hard to heal. You may need to try several different types of treatment to get your venous ulcer to heal. Healing may take a long time. Follow these instructions at home: Medicines Take or apply over-the-counter and prescription medicines only as told by your health care provider. If you were prescribed an antibiotic medicine, take it as told by your health care provider. Do not stop using the antibiotic even if you start to feel better. Ask your health care provider if you should take aspirin before long trips. Wound care Follow instructions from your health care provider about how to take care of your wound. Make sure you: Wash your hands with soap and water before and after you change your bandage (dressing). If soap and water are not available, use hand sanitizer. Change your dressing as told by your health care provider. If you had a skin graft, leave stitches (sutures) in place. These may need to stay in place for 2   weeks or longer. Ask when you should remove your dressing. If your dressing is dry and sticks to your leg when you try to remove it, moisten or wet the dressing with saline solution or water so that the dressing can be removed without harming your skin or wound tissue. When you are able to remove your dressing, check your wound every day for signs of infection. Have a caregiver do this for you if you are not able to  do it yourself. Check for: More redness, swelling, or pain. More fluid or blood. Warmth. Pus or a bad smell. Activity Avoid sitting for a long time without moving. Get up to take short walks every 1-2 hours. This is important to improve blood flow in your legs. Ask for help if you feel weak or unsteady. Ask your health care provider what level of activity is safe for you. Rest with your legs raised (elevated) during the day. If possible, elevate your legs above the level of your heart for 30 minutes, 3-4 times a day, or as told by your health care provider. Do not sit with your legs crossed. General instructions  Wear elastic stockings, compression stockings, or support hose as told by your health care provider. Raise the foot of your bed as told by your health care provider. Do not use any products that contain nicotine or tobacco, such as cigarettes, e-cigarettes, and chewing tobacco. If you need help quitting, ask your health care provider. Keep all follow-up visits as told by your health care provider. This is important. Contact a health care provider if: Your ulcer is getting larger or is not healing. Your pain gets worse. Get help right away if you have: More redness, swelling, or pain around your ulcer. More fluid or blood coming from your ulcer. Warmth in the area around your ulcer. Pus or a bad smell coming from your ulcer. A fever. Summary A venous ulcer is a shallow sore on your lower leg that is caused by poor circulation in your veins. Follow instructions from your health care provider about how to take care of your wound. Check your wound every day for signs of infection. Take over-the-counter and prescription medicines only as told by your health care provider. Keep all follow-up visits as told by your health care provider. This is important. This information is not intended to replace advice given to you by your health care provider. Make sure you discuss any questions  you have with your health care provider. Document Revised: 10/03/2021 Document Reviewed: 10/03/2021 Elsevier Patient Education  2023 [employer]  

## 2022-04-08 ENCOUNTER — Encounter (HOSPITAL_BASED_OUTPATIENT_CLINIC_OR_DEPARTMENT_OTHER): Payer: Medicare Other | Attending: General Surgery | Admitting: General Surgery

## 2022-04-08 DIAGNOSIS — L97822 Non-pressure chronic ulcer of other part of left lower leg with fat layer exposed: Secondary | ICD-10-CM | POA: Insufficient documentation

## 2022-04-08 DIAGNOSIS — N183 Chronic kidney disease, stage 3 unspecified: Secondary | ICD-10-CM | POA: Insufficient documentation

## 2022-04-08 DIAGNOSIS — Z6841 Body Mass Index (BMI) 40.0 and over, adult: Secondary | ICD-10-CM | POA: Diagnosis not present

## 2022-04-08 DIAGNOSIS — E11622 Type 2 diabetes mellitus with other skin ulcer: Secondary | ICD-10-CM | POA: Diagnosis not present

## 2022-04-08 DIAGNOSIS — I872 Venous insufficiency (chronic) (peripheral): Secondary | ICD-10-CM | POA: Diagnosis not present

## 2022-04-08 DIAGNOSIS — I129 Hypertensive chronic kidney disease with stage 1 through stage 4 chronic kidney disease, or unspecified chronic kidney disease: Secondary | ICD-10-CM | POA: Insufficient documentation

## 2022-04-08 DIAGNOSIS — E1122 Type 2 diabetes mellitus with diabetic chronic kidney disease: Secondary | ICD-10-CM | POA: Insufficient documentation

## 2022-04-08 NOTE — Progress Notes (Addendum)
ELAH, AVELLINO ([id number]) ?Visit Report for 04/08/2022 ?Chief Complaint Document Details ?Patient Name: Date of Service: ?Jocelyn Camacho, Jocelyn [name] 04/08/2022 1:30 PM ?Medical Record Number: [id number] ?Patient Account Number: [id number] ?Date of Birth/Sex: Treating RN: ?Jocelyn Camacho (76 y.o. F) ?Primary Care Provider: Kirtland Camacho Other Clinician: ?Referring Provider: ?Treating Provider/Extender: Jocelyn Camacho ?Jocelyn Camacho ?Weeks in Treatment: 0 ?Information Obtained from: Patient ?Chief Complaint ?Patient presents for treatment of open ulcers due to venous insufficiency ?Electronic Signature(s) ?Signed: 04/08/2022 2:33:56 PM By: Jocelyn Maudlin MD FACS ?Entered By: Jocelyn Camacho on 04/08/2022 14:33:55 ?-------------------------------------------------------------------------------- ?Debridement Details ?Patient Name: Date of Service: ?Jocelyn Camacho, Jocelyn [name] 04/08/2022 1:30 PM ?Medical Record Number: [id number] ?Patient Account Number: [id number] ?Date of Birth/Sex: Treating RN: ?Jocelyn Camacho ?Primary Care Provider: Kirtland Camacho Other Clinician: ?Referring Provider: ?Treating Provider/Extender: Jocelyn Camacho ?Jocelyn Camacho ?Weeks in Treatment: 0 ?Debridement Performed for Assessment: Wound #1 Left,Lateral Lower Leg ?Performed By: Clinician Jocelyn Hurst, RN ?Debridement Type: Chemical/Enzymatic/Mechanical ?Agent Used: Wound cleanser and gauze to remove biofilm ?Severity of Tissue Pre Debridement: Fat layer exposed ?Level of Consciousness (Pre-procedure): Awake and Alert ?Pre-procedure Verification/Time Out Yes - 14:30 ?Taken: ?Start Time: 14:30 ?Bleeding: None ?Procedural Pain: 0 ?Post Procedural Pain: 0 ?Response to Treatment: Procedure was tolerated well ?Level of Consciousness (Post- Awake and Alert ?procedure): ?Post Debridement Measurements of Total Wound ?Length: (cm) 4.8 ?Width: (cm) 5 ?Depth: (cm) 0.1 ?Volume: (cm?) 1.885 ?Character of Wound/Ulcer Post Debridement: Improved ?Severity of  Tissue Post Debridement: Fat layer exposed ?Post Procedure Diagnosis ?Same as Pre-procedure ?Electronic Signature(s) ?Signed: 04/08/2022 3:53:24 PM By: Jocelyn Maudlin MD FACS ?Signed: 04/08/2022 5:59:14 PM By: Jocelyn Hurst RN, BSN ?Entered By: Jocelyn Camacho on 04/08/2022 15:23:25 ?-------------------------------------------------------------------------------- ?HPI Details ?Patient Name: Date of Service: ?Jocelyn Camacho, Jocelyn [name] 04/08/2022 1:30 PM ?Medical Record Number: [id number] ?Patient Account Number: [id number] ?Date of Birth/Sex: Treating RN: ?[birth year] (76 y.o. F) ?Primary Care Provider: Kirtland Camacho Other Clinician: ?Referring Provider: ?Treating Provider/Extender: Jocelyn Camacho ?Jocelyn Camacho ?Weeks in Treatment: 0 ?History of Present Illness ?HPI Description: ADMISSION ?04/08/2022 ?This is a 76 year old woman with a past medical history significant for type 2 diabetes mellitus with stage III chronic kidney disease, essential hypertension, ?obesity, and known venous stasis disease. She says that she has had trouble with her left leg for about the past 6 months. She says that wounds will open ?and then she will get them closed only to have them opened up again. The current episode began in March. She saw her primary care provider on March 14. ?She was prescribed a 2-week course of antibiotics and was advised to apply Betadine sugar to the wounds. She was seen again yesterday and the doxycycline ?extended due to some periwound erythema. Jocelyn Camacho has worn compression stockings in the past but they are now at least a year old. She is not certain what ?degree of compression they provided. ABI in clinic today was 1.17. She does not smoke. ?Electronic Signature(s) ?Signed: 04/08/2022 2:36:04 PM By: Jocelyn Maudlin MD FACS ?Entered By: Jocelyn Camacho on 04/08/2022 14:36:04 ?-------------------------------------------------------------------------------- ?Physical Exam Details ?Patient Name: Date of  Service: ?Jocelyn Camacho, Jocelyn [name] 04/08/2022 1:30 PM ?Medical Record Number: [id number] ?Patient Account Number: [id number] ?Date of Birth/Sex: Treating RN: ?[birth year] (76 y.o. F) ?Primary Care Provider: Kirtland Camacho Other Clinician: ?Referring Provider: ?Treating Provider/Extender: Jocelyn Camacho ?Jocelyn Camacho ?Weeks in Treatment: 0 ?Constitutional ?. Mildly tachycardic, asymptomatic. . . No acute distress. ?Respiratory ?Normal work of breathing on room air.Marland Kitchen ?Cardiovascular ?Easily palpable 2+ dorsalis pedis  pulses bilaterally.. 1+ pitting edema on the right; 2+ pitting edema on the left.Marland Kitchen ?Notes ?04/08/2022: All of the wounds are quite superficial and there is no visible fat layer. They are clean without slough or exudate. There is some periwound ?erythema, particularly at the more proximal aspect of her limb, near the tibial tuberosity. No significant drainage or odor. ?Electronic Signature(s) ?Signed: 04/08/2022 2:37:43 PM By: Jocelyn Maudlin MD FACS ?Entered By: Jocelyn Camacho on 04/08/2022 14:37:43 ?-------------------------------------------------------------------------------- ?Physician Orders Details ?Patient Name: ?Date of Service: ?Jocelyn Camacho, Jocelyn [name] 04/08/2022 1:30 PM ?Medical Record Number: [id number] ?Patient Account Number: [id number] ?Date of Birth/Sex: ?Treating RN: ?[birth year] (76 y.o. Jocelyn Camacho, Camacho ?Primary Care Provider: Kirtland Camacho ?Other Clinician: ?Referring Provider: ?Treating Provider/Extender: Jocelyn Camacho ?Jocelyn Camacho ?Weeks in Treatment: 0 ?Verbal / Phone Orders: No ?Diagnosis Coding ?ICD-10 Coding ?Code Description ?L97.321 Non-pressure chronic ulcer of left ankle limited to breakdown of skin ?E11.622 Type 2 diabetes mellitus with other skin ulcer ?I87.8 Other specified disorders of veins ?E66.01 Morbid (severe) obesity due to excess calories ?N18.30 Chronic kidney disease, stage 3 unspecified ?Follow-up Appointments ?ppointment in 1 week. - Dr. Celine Camacho - Room 2 ?Return A ?Bathing/  Shower/ Hygiene ?May shower with protection but do not get wound dressing(s) wet. - Ok to use Biomedical engineer, can buy at [facility], [place], or [place] ?Edema Control - Lymphedema / SCD / Other ?Elevate legs to the level of the heart or above for 30 minutes daily and/or when sitting, a frequency of: - throughout the day ?Avoid standing for long periods of time. ?Exercise regularly ?Compression stocking or Garment 20-30 mm/Hg pressure to: - right leg daily ?Wound Treatment ?Wound #1 - Lower Leg Wound Laterality: Left, Lateral ?Cleanser: Soap and Water 1 x Per Week/7 Days ?Discharge Instructions: May shower and wash wound with dial antibacterial soap and water prior to dressing change. ?Cleanser: Wound Cleanser 1 x Per Week/7 Days ?Discharge Instructions: Cleanse the wound with wound cleanser prior to applying a clean dressing using gauze sponges, not tissue or cotton balls. ?Peri-Wound Care: Triamcinolone 15 (g) 1 x Per Week/7 Days ?Discharge Instructions: Use triamcinolone to periwound ?Peri-Wound Care: Sween Lotion (Moisturizing lotion) 1 x Per Week/7 Days ?Discharge Instructions: Apply moisturizing lotion as directed ?Prim Dressing: Promogran Prisma Matrix, 4.34 (sq in) (silver collagen) 1 x Per Week/7 Days ?ary ?Discharge Instructions: Moisten collagen with saline or hydrogel ?Secondary Dressing: ABD Pad, 8x10 1 x Per Week/7 Days ?Discharge Instructions: Apply over primary dressing as directed. ?Secondary Dressing: Zetuvit Plus 4x8 in 1 x Per Week/7 Days ?Discharge Instructions: Apply over primary dressing as directed. ?Compression Wrap: ThreePress (3 layer compression wrap) 1 x Per Week/7 Days ?Discharge Instructions: Apply three layer compression as directed. ?Wound #2 - Lower Leg Wound Laterality: Left, Lateral, Proximal ?Cleanser: Soap and Water 1 x Per Week/7 Days ?Discharge Instructions: May shower and wash wound with dial antibacterial soap and water prior to dressing change. ?Cleanser: Wound Cleanser 1 x Per  Week/7 Days ?Discharge Instructions: Cleanse the wound with wound cleanser prior to applying a clean dressing using gauze sponges, not tissue or cotton balls. ?Peri-Wound Care: Triamcinolone 15 (g) 1 x Per Week/7 Days ?D

## 2022-04-08 NOTE — Progress Notes (Signed)
Jocelyn Camacho, Jocelyn Camacho ([id number]) ?Visit Report for 04/08/2022 ?Abuse Risk Screen Details ?Patient Name: Date of Service: ?Jocelyn Camacho, Jocelyn Camacho [name] 04/08/2022 1:30 PM ?Medical Record Number: [id number] ?Patient Account Number: [id number] ?Date of Birth/Sex: Treating RN: ?23-Nov-1946 (76 y.o. Jocelyn Camacho, Jocelyn Camacho ?Primary Care Jocelyn Camacho: Jocelyn Camacho Other Clinician: ?Referring Jocelyn Camacho: ?Treating Kaelie Henigan/Extender: Jocelyn Camacho ?Jocelyn Camacho ?Weeks in Treatment: 0 ?Abuse Risk Screen Items ?Answer ?ABUSE RISK SCREEN: ?Has anyone close to you tried to hurt or harm you recentlyo No ?Do you feel uncomfortable with anyone in your familyo No ?Has anyone forced you do things that you didnt want to doo No ?Electronic Signature(s) ?Signed: 04/08/2022 5:59:14 PM By: Jocelyn Hurst RN, BSN ?Entered By: Jocelyn Camacho on 04/08/2022 14:05:17 ?-------------------------------------------------------------------------------- ?Activities of Daily Living Details ?Patient Name: Date of Service: ?Jocelyn Camacho, Jocelyn Camacho [name] 04/08/2022 1:30 PM ?Medical Record Number: [id number] ?Patient Account Number: [id number] ?Date of Birth/Sex: Treating RN: ?04/29/1946 (76 y.o. Jocelyn Camacho, Jocelyn Camacho ?Primary Care Jocelyn Camacho: Jocelyn Camacho Other Clinician: ?Referring Jocelyn Camacho: ?Treating Jocelyn Camacho/Extender: Jocelyn Camacho ?Jocelyn Camacho ?Weeks in Treatment: 0 ?Activities of Daily Living Items ?Answer ?Activities of Daily Living (Please select one for each item) ?[place] ?T Medications ?ake Completely Able ?Use T elephone Completely Able ?Care for Appearance Completely Able ?Use T oilet Completely Able ?Bath / Shower Completely Able ?Dress Self Completely Able ?Feed Self Completely Able ?Walk Completely Able ?Get In / Out Bed Completely Able ?Housework Completely Able ?Prepare Meals Completely Able ?Handle Money Completely Able ?Shop for Self Completely Able ?Electronic Signature(s) ?Signed: 04/08/2022 5:59:14 PM By: Jocelyn Hurst RN, BSN ?Entered By: Jocelyn Camacho on 04/08/2022 14:05:42 ?-------------------------------------------------------------------------------- ?Education Screening Details ?Patient Name: ?Date of Service: ?Jocelyn Camacho, Jocelyn Camacho [name] 04/08/2022 1:30 PM ?Medical Record Number: [id number] ?Patient Account Number: [id number] ?Date of Birth/Sex: ?Treating RN: ?[birth year] (76 y.o. Jocelyn Camacho, Jocelyn Camacho ?Primary Care Jocelyn Camacho: Jocelyn Camacho ?Other Clinician: ?Referring Jocelyn Camacho: ?Treating Jocelyn Camacho/Extender: Jocelyn Camacho ?Jocelyn Camacho ?Weeks in Treatment: 0 ?Primary Learner Assessed: Patient ?Learning Preferences/Education Level/Primary Language ?Learning Preference: Explanation, Demonstration, Printed Material ?Highest Education Level: College or Above ?Preferred Language: English ?Cognitive Barrier ?Language Barrier: No ?Translator Needed: No ?Memory Deficit: No ?Emotional Barrier: No ?Cultural/Religious Beliefs Affecting Medical Care: No ?Physical Barrier ?Impaired Vision: No ?Impaired Hearing: No ?Decreased Hand dexterity: No ?Knowledge/Comprehension ?Knowledge Level: High ?Comprehension Level: High ?Ability to understand written instructions: High ?Ability to understand verbal instructions: High ?Motivation ?Anxiety Level: Calm ?Cooperation: Cooperative ?Education Importance: Acknowledges Need ?Interest in Health Problems: Asks Questions ?Perception: Coherent ?Willingness to Engage in Self-Management High ?Activities: ?Readiness to Engage in Self-Management High ?Activities: ?Electronic Signature(s) ?Signed: 04/08/2022 5:59:14 PM By: Jocelyn Hurst RN, BSN ?Entered By: Jocelyn Camacho on 04/08/2022 14:06:05 ?-------------------------------------------------------------------------------- ?Fall Risk Assessment Details ?Patient Name: ?Date of Service: ?Jocelyn Camacho, Jocelyn Camacho [name] 04/08/2022 1:30 PM ?Medical Record Number: [id number] ?Patient Account Number: [id number] ?Date of Birth/Sex: ?Treating RN: ?1946/08/10 (76 y.o. Jocelyn Camacho, Jocelyn Camacho ?Primary Care Jocelyn Camacho: Jocelyn Camacho ?Other Clinician: ?Referring Jocelyn Camacho: ?Treating Jocelyn Camacho/Extender: Jocelyn Camacho ?Jocelyn Camacho ?Weeks in Treatment: 0 ?Fall Risk Assessment Items ?Have you had 2 or more falls in the last 12 monthso 0 No ?Have you had any fall that resulted in injury in the last 12 monthso 0 No ?FALLS RISK SCREEN ?History of falling - immediate or within 3 months 0 No ?Secondary diagnosis (Do you have 2 or more medical diagnoseso) 15 Yes ?Ambulatory aid ?None/bed rest/wheelchair/nurse 0 No ?Crutches/cane/walker 15 Yes ?Furniture 0 No ?Intravenous therapy Access/Saline/Heparin Lock 0 No ?Gait/Transferring ?Normal/ bed rest/ wheelchair 0  Yes ?Weak (short steps with or without shuffle, stooped but able to lift head while walking, may seek 0 No ?support from furniture) ?Impaired (short steps with shuffle, may have difficulty arising from chair, head down, impaired 0 No ?balance) ?Mental Status ?Oriented to own ability 0 Yes ?Electronic Signature(s) ?Signed: 04/08/2022 5:59:14 PM By: Jocelyn Hurst RN, BSN ?Entered By: Jocelyn Camacho on 04/08/2022 14:06:20 ?-------------------------------------------------------------------------------- ?Foot Assessment Details ?Patient Name: ?Date of Service: ?Jocelyn Camacho, Jocelyn Camacho [name] 04/08/2022 1:30 PM ?Medical Record Number: [id number] ?Patient Account Number: [id number] ?Date of Birth/Sex: ?Treating RN: ?Mar 11, 1946 (76 y.o. Jocelyn Camacho, Jocelyn Camacho ?Primary Care Jocelyn Camacho: Jocelyn Camacho ?Other Clinician: ?Referring Jocelyn Camacho: ?Treating Jocelyn Camacho/Extender: Jocelyn Camacho ?Jocelyn Camacho ?Weeks in Treatment: 0 ?Foot Assessment Items ?Site Locations ?+ = Sensation present, - = Sensation absent, C = Callus, U = Ulcer ?R = Redness, W = Warmth, M = Maceration, PU = Pre-ulcerative lesion ?F = Fissure, S = Swelling, D = Dryness ?Assessment ?Right: Left: ?Other Deformity: No No ?Prior Foot Ulcer: No No ?Prior Amputation: No No ?Charcot Joint: No No ?Ambulatory Status: Ambulatory With Help ?Assistance Device: Cane ?Gait:  Steady ?Electronic Signature(s) ?Signed: 04/08/2022 5:59:14 PM By: Jocelyn Hurst RN, BSN ?Entered By: Jocelyn Camacho on 04/08/2022 14:07:07 ?-------------------------------------------------------------------------------- ?Nutrition Risk Screening Details ?Patient Name: ?Date of Service: ?Jocelyn Camacho, Jocelyn Camacho [name] 04/08/2022 1:30 PM ?Medical Record Number: [id number] ?Patient Account Number: [id number] ?Date of Birth/Sex: ?Treating RN: ?Oct 06, 1946 (76 y.o. Jocelyn Camacho, Jocelyn Camacho ?Primary Care Orry Sigl: Jocelyn Camacho ?Other Clinician: ?Referring Aryelle Figg: ?Treating Paraskevi Funez/Extender: Jocelyn Camacho ?Jocelyn Camacho ?Weeks in Treatment: 0 ?Height (in): 64 ?Weight (lbs): 235 ?Body Mass Index (BMI): 40.3 ?Nutrition Risk Screening Items ?Score Screening ?NUTRITION RISK SCREEN: ?I have an illness or condition that made me change the kind and/or amount of food I eat 2 Yes ?I eat fewer than two meals per day 0 No ?I eat few fruits and vegetables, or milk products 0 No ?I have three or more drinks of beer, liquor or wine almost every day 0 No ?I have tooth or mouth problems that make it hard for me to eat 0 No ?I don't always have enough money to buy the food I need 0 No ?I eat alone most of the time 0 No ?I take three or more different prescribed or over-the-counter drugs a day 1 Yes ?Without wanting to, I have lost or gained 10 pounds in the last six months 0 No ?I am not always physically able to shop, cook and/or feed myself 0 No ?Nutrition Protocols ?Good Risk Protocol ?Moderate Risk Protocol 0 Provide education on nutrition ?High Risk Proctocol ?Risk Level: Moderate Risk ?Score: 3 ?Electronic Signature(s) ?Signed: 04/08/2022 5:59:14 PM By: Jocelyn Hurst RN, BSN ?Entered By: Jocelyn Camacho on 04/08/2022 14:06:49 ?

## 2022-04-08 NOTE — Progress Notes (Signed)
Jocelyn Camacho, Jocelyn Camacho ([id number]) ?Visit Report for 04/08/2022 ?Allergy List Details ?Patient Name: Date of Service: ?Jocelyn Camacho, Jocelyn Jocelyn P. 04/08/2022 1:30 PM ?Medical Record Number: [id number] ?Patient Account Number: [id number] ?Date of Birth/Sex: Treating RN: ?1946/05/17 (76 y.o. Jocelyn Camacho, Jocelyn Camacho ?Primary Care Danyela Posas: Marinus Maw Other Clinician: ?Referring Kyce Ging: ?Treating Tico Crotteau/Extender: Duanne Guess ?Rance Muir ?Weeks in Treatment: 0 ?Allergies ?Active Allergies ?penicillin ?Reaction: swelling ?Severity: Moderate ?Sulfa (Sulfonamide Antibiotics) ?Reaction: itching, rash ?Severity: Moderate ?Allergy Notes ?Electronic Signature(s) ?Signed: 04/08/2022 5:59:14 PM By: Zandra Abts RN, BSN ?Entered By: Zandra Abts on 04/08/2022 13:47:21 ?-------------------------------------------------------------------------------- ?Arrival Information Details ?Patient Name: Date of Service: ?Jocelyn Camacho, Jocelyn Jocelyn P. 04/08/2022 1:30 PM ?Medical Record Number: [id number] ?Patient Account Number: [id number] ?Date of Birth/Sex: Treating RN: ?10/19/1946 (76 y.o. Jocelyn Camacho, Jocelyn Camacho ?Primary Care Marquasia Schmieder: Marinus Maw Other Clinician: ?Referring Daymien Goth: ?Treating Maija Biggers/Extender: Duanne Guess ?Rance Muir ?Weeks in Treatment: 0 ?Visit Information ?Patient Arrived: Gilmer Mor ?Arrival Time: 13:42 ?Accompanied By: alone ?Transfer Assistance: None ?Patient Identification Verified: Yes ?Secondary Verification Process Completed: Yes ?Patient Requires Transmission-Based Precautions: No ?Patient Has Alerts: No ?Electronic Signature(s) ?Signed: 04/08/2022 5:59:14 PM By: Zandra Abts RN, BSN ?Entered By: Zandra Abts on 04/08/2022 13:42:20 ?-------------------------------------------------------------------------------- ?Clinic Level of Care Assessment Details ?Patient Name: Date of Service: ?Jocelyn Camacho, Jocelyn Jocelyn P. 04/08/2022 1:30 PM ?Medical Record Number: [id number] ?Patient Account Number: [id number] ?Date of Birth/Sex: Treating RN: ?April 18, 1946 (76 y.o. Jocelyn Camacho, Jocelyn Camacho ?Primary Care Lennix Kneisel: Marinus Maw Other Clinician: ?Referring Tommaso Cavitt: ?Treating Kaelin Bonelli/Extender: Duanne Guess ?Rance Muir ?Weeks in Treatment: 0 ?Clinic Level of Care Assessment Items ?TOOL 1 Quantity Score ?X- 1 0 ?Use when EandM and Procedure is performed on INITIAL visit ?ASSESSMENTS - Nursing Assessment / Reassessment ?X- 1 20 ?General Physical Exam (combine w/ comprehensive assessment (listed just below) when performed on new pt. evals) ?X- 1 25 ?Comprehensive Assessment (HX, ROS, Risk Assessments, Wounds Hx, etc.) ?ASSESSMENTS - Wound and Skin Assessment / Reassessment ?[]  - 0 ?Dermatologic / Skin Assessment (not related to wound area) ?ASSESSMENTS - Ostomy and/or Continence Assessment and Care ?[]  - 0 ?Incontinence Assessment and Management ?[]  - 0 ?Ostomy Care Assessment and Management (repouching, etc.) ?PROCESS - Coordination of Care ?X - Simple Patient / Family Education for ongoing care 1 15 ?[]  - 0 ?Complex (extensive) Patient / Family Education for ongoing care ?X- 1 10 ?Staff obtains Consents, Records, T Results / Process Orders ?est ?[]  - 0 ?Staff telephones HHA, Nursing Homes / Clarify orders / etc ?[]  - 0 ?Routine Transfer to another Facility (non-emergent condition) ?[]  - 0 ?Routine Hospital Admission (non-emergent condition) ?X- 1 15 ?New Admissions / Manufacturing engineer / Ordering NPWT Apligraf, etc. ?, ?[]  - 0 ?Emergency Hospital Admission (emergent condition) ?PROCESS - Special Needs ?[]  - 0 ?Pediatric / Minor Patient Management ?[]  - 0 ?Isolation Patient Management ?[]  - 0 ?Hearing / Language / Visual special needs ?[]  - 0 ?Assessment of Community assistance (transportation, D/C planning, etc.) ?[]  - 0 ?Additional assistance / Altered mentation ?[]  - 0 ?Support Surface(s) Assessment (bed, cushion, seat, etc.) ?INTERVENTIONS - Miscellaneous ?[]  - 0 ?External ear exam ?[]  - 0 ?Patient Transfer (multiple staff / Nurse, adult / Similar devices) ?[]  -  0 ?Simple Staple / Suture removal (25 or less) ?[]  - 0 ?Complex Staple / Suture removal (26 or more) ?[]  - 0 ?Hypo/Hyperglycemic Management (do not check if billed separately) ?X- 1 15 ?Ankle / Brachial Index (ABI) - do not check if billed separately ?Has the patient been seen at the hospital within the  last three years: Yes ?Total Score: 100 ?Level Of Care: New/Established - Level 3 ?Electronic Signature(s) ?Signed: 04/08/2022 5:59:14 PM By: Zandra Abts RN, BSN ?Entered By: Zandra Abts on 04/08/2022 16:54:39 ?-------------------------------------------------------------------------------- ?Compression Therapy Details ?Patient Name: ?Date of Service: ?Jocelyn Camacho, Jocelyn Jocelyn P. 04/08/2022 1:30 PM ?Medical Record Number: [id number] ?Patient Account Number: [id number] ?Date of Birth/Sex: ?Treating RN: ?11/16/1946 (76 y.o. Jocelyn Camacho, Jocelyn Camacho ?Primary Care Ramie Erman: Marinus Maw ?Other Clinician: ?Referring Barnabas Henriques: ?Treating Jeslin Bazinet/Extender: Duanne Guess ?Rance Muir ?Weeks in Treatment: 0 ?Compression Therapy Performed for Wound Assessment: Wound #1 Left,Lateral Lower Leg ?Performed By: Clinician Zandra Abts, RN ?Compression Type: Three Layer ?Post Procedure Diagnosis ?Same as Pre-procedure ?Electronic Signature(s) ?Signed: 04/08/2022 5:59:14 PM By: Zandra Abts RN, BSN ?Entered By: Zandra Abts on 04/08/2022 14:30:00 ?-------------------------------------------------------------------------------- ?Compression Therapy Details ?Patient Name: ?Date of Service: ?Jocelyn Camacho, Jocelyn Jocelyn P. 04/08/2022 1:30 PM ?Medical Record Number: [id number] ?Patient Account Number: [id number] ?Date of Birth/Sex: ?Treating RN: ?12-Jul-1946 (76 y.o. Jocelyn Camacho, Jocelyn Camacho ?Primary Care Van Seymore: Marinus Maw ?Other Clinician: ?Referring Chirstopher Iovino: ?Treating Manveer Gomes/Extender: Duanne Guess ?Rance Muir ?Weeks in Treatment: 0 ?Compression Therapy Performed for Wound Assessment: Wound #2 Left,Proximal,Lateral Lower Leg ?Performed By: Clinician  Zandra Abts, RN ?Compression Type: Three Layer ?Post Procedure Diagnosis ?Same as Pre-procedure ?Electronic Signature(s) ?Signed: 04/08/2022 5:59:14 PM By: Zandra Abts RN, BSN ?Entered By: Zandra Abts on 04/08/2022 14:30:00 ?-------------------------------------------------------------------------------- ?Encounter Discharge Information Details ?Patient Name: ?Date of Service: ?Jocelyn Camacho, Jocelyn Jocelyn P. 04/08/2022 1:30 PM ?Medical Record Number: [id number] ?Patient Account Number: [id number] ?Date of Birth/Sex: ?Treating RN: ?18-Jan-1946 (76 y.o. Jocelyn Camacho, Jocelyn Camacho ?Primary Care Inga Noller: Marinus Maw ?Other Clinician: ?Referring Amberlee Garvey: ?Treating Aimi Essner/Extender: Duanne Guess ?Rance Muir ?Weeks in Treatment: 0 ?Encounter Discharge Information Items Post Procedure Vitals ?Discharge Condition: Stable ?Temperature (F): 97.6 ?Ambulatory Status: Ambulatory ?Pulse (bpm): 111 ?Discharge Destination: Home ?Respiratory Rate (breaths/min): 18 ?Transportation: Private Auto ?Blood Pressure (mmHg): 120/78 ?Accompanied By: alone ?Schedule Follow-up Appointment: Yes ?Clinical Summary of Care: Patient Declined ?Electronic Signature(s) ?Signed: 04/08/2022 5:59:14 PM By: Zandra Abts RN, BSN ?Entered By: Zandra Abts on 04/08/2022 16:56:18 ?-------------------------------------------------------------------------------- ?Lower Extremity Assessment Details ?Patient Name: ?Date of Service: ?Jocelyn Camacho, Jocelyn Jocelyn P. 04/08/2022 1:30 PM ?Medical Record Number: [id number] ?Patient Account Number: [id number] ?Date of Birth/Sex: ?Treating RN: ?April 03, 1946 (76 y.o. Jocelyn Camacho, Jocelyn Camacho ?Primary Care Dwain Huhn: Marinus Maw ?Other Clinician: ?Referring Nanetta Wiegman: ?Treating Kinzy Weyers/Extender: Duanne Guess ?Rance Muir ?Weeks in Treatment: 0 ?Edema Assessment ?Assessed: [Left: No] [Right: No] ?Edema: [Left: Ye] [Right: s] ?Calf ?Left: Right: ?Point of Measurement: 30 cm From Medial Instep 42.5 cm ?Ankle ?Left: Right: ?Point of Measurement: 10 cm  From Medial Instep 25 cm ?Knee To Floor ?Left: Right: ?From Medial Instep 40 cm ?Vascular Assessment ?Pulses: ?Dorsalis Pedis ?Palpable: [Left:Yes] ?Blood Pressure: ?Brachial: [Left:120] ?Ankle: ?[Left:Do

## 2022-04-15 ENCOUNTER — Encounter (HOSPITAL_BASED_OUTPATIENT_CLINIC_OR_DEPARTMENT_OTHER): Payer: Medicare Other | Admitting: General Surgery

## 2022-04-15 DIAGNOSIS — E11622 Type 2 diabetes mellitus with other skin ulcer: Secondary | ICD-10-CM | POA: Diagnosis not present

## 2022-04-16 ENCOUNTER — Other Ambulatory Visit (HOSPITAL_COMMUNITY): Payer: Self-pay | Admitting: General Surgery

## 2022-04-16 DIAGNOSIS — L97321 Non-pressure chronic ulcer of left ankle limited to breakdown of skin: Secondary | ICD-10-CM

## 2022-04-16 NOTE — Progress Notes (Signed)
Jocelyn Camacho, Jocelyn Camacho ([id number]) ?Visit Report for 04/15/2022 ?Arrival Information Details ?Patient Name: Date of Service: ?Jocelyn Camacho, Jocelyn [name] 04/15/2022 10:45 A M ?Medical Record Number: [id number] ?Patient Account Number: [id number] ?Date of Birth/Sex: Treating RN: ?1946-02-12 (76 y.o. [name], [name] ?Primary Care Marieli Rudy: Marinus Maw Other Clinician: ?Referring Clinton Dragone: ?Treating Shontell Prosser/Extender: Duanne Guess ?[name] ?Weeks in Treatment: 1 ?Visit Information History Since Last Visit ?Added or deleted any medications: No ?Patient Arrived: Gilmer Mor ?Any new allergies or adverse reactions: No ?Arrival Time: 10:38 ?Had a fall or experienced change in No ?Accompanied By: alone ?activities of daily living that may affect ?Transfer Assistance: None ?risk of falls: ?Patient Identification Verified: Yes ?Signs or symptoms of abuse/neglect since last visito No ?Secondary Verification Process Completed: Yes ?Hospitalized since last visit: No ?Patient Requires Transmission-Based Precautions: No ?Implantable device outside of the clinic excluding No ?Patient Has Alerts: No ?cellular tissue based products placed in the center ?since last visit: ?Has Dressing in Place as Prescribed: Yes ?Has Compression in Place as Prescribed: Yes ?Pain Present Now: No ?Electronic Signature(s) ?Signed: 04/16/2022 5:38:47 PM By: Zandra Abts RN, BSN ?Entered By: Zandra Abts on 04/15/2022 10:38:44 ?-------------------------------------------------------------------------------- ?Compression Therapy Details ?Patient Name: Date of Service: ?Jocelyn Camacho, Jocelyn [name] 04/15/2022 10:45 A M ?Medical Record Number: [id number] ?Patient Account Number: [id number] ?Date of Birth/Sex: Treating RN: ?11/29/1946 (76 y.o. [name], [name] ?Primary Care Tenita Cue: Marinus Maw Other Clinician: ?Referring Lukis Bunt: ?Treating Kieren Adkison/Extender: Duanne Guess ?[name] ?Weeks in Treatment: 1 ?Compression Therapy Performed for Wound Assessment:  Wound #1 Left,Lateral Lower Leg ?Performed By: Clinician Zandra Abts, RN ?Compression Type: Three Layer ?Post Procedure Diagnosis ?Same as Pre-procedure ?Electronic Signature(s) ?Signed: 04/16/2022 5:38:47 PM By: Zandra Abts RN, BSN ?Entered By: Zandra Abts on 04/15/2022 11:01:27 ?-------------------------------------------------------------------------------- ?Encounter Discharge Information Details ?Patient Name: ?Date of Service: ?Jocelyn Camacho, Jocelyn [name] 04/15/2022 10:45 A M ?Medical Record Number: [id number] ?Patient Account Number: [id number] ?Date of Birth/Sex: ?Treating RN: ?11-11-1946 (76 y.o. [name], [name] ?Primary Care Khia Dieterich: Marinus Maw ?Other Clinician: ?Referring Caleigh Rabelo: ?Treating Dontee Jaso/Extender: Duanne Guess ?[name] ?Weeks in Treatment: 1 ?Encounter Discharge Information Items ?Discharge Condition: Stable ?Ambulatory Status: Gilmer Mor ?Discharge Destination: Home ?Transportation: Private Auto ?Accompanied By: alone ?Schedule Follow-up Appointment: Yes ?Clinical Summary of Care: Patient Declined ?Electronic Signature(s) ?Signed: 04/16/2022 5:38:47 PM By: Zandra Abts RN, BSN ?Entered By: Zandra Abts on 04/15/2022 16:09:31 ?-------------------------------------------------------------------------------- ?Lower Extremity Assessment Details ?Patient Name: ?Date of Service: ?Jocelyn Camacho, Jocelyn [name] 04/15/2022 10:45 A M ?Medical Record Number: [id number] ?Patient Account Number: [id number] ?Date of Birth/Sex: ?Treating RN: ?1946/06/01 (76 y.o. [name], [name] ?Primary Care Anayeli Arel: Marinus Maw ?Other Clinician: ?Referring Yamilett Anastos: ?Treating Maame Dack/Extender: Duanne Guess ?[name] ?Weeks in Treatment: 1 ?Edema Assessment ?Assessed: [Left: No] [Right: No] ?Edema: [Left: Ye] [Right: s] ?Calf ?Left: Right: ?Point of Measurement: 30 cm From Medial Instep 39.5 cm ?Ankle ?Left: Right: ?Point of Measurement: 10 cm From Medial Instep 24 cm ?Vascular  Assessment ?Pulses: ?Dorsalis Pedis ?Palpable: [Left:Yes] ?Electronic Signature(s) ?Signed: 04/16/2022 5:38:47 PM By: Zandra Abts RN, BSN ?Entered By: Zandra Abts on 04/15/2022 10:48:49 ?-------------------------------------------------------------------------------- ?Multi Wound Chart Details ?Patient Name: ?Date of Service: ?Jocelyn Camacho, Jocelyn [name] 04/15/2022 10:45 A M ?Medical Record Number: [id number] ?Patient Account Number: [id number] ?Date of Birth/Sex: ?Treating RN: ?April 08, 1946 (76 y.o. [name], [name] ?Primary Care Jemario Poitras: Marinus Maw ?Other Clinician: ?Referring Anmol Fleck: ?Treating Lonia Roane/Extender: Duanne Guess ?[name] ?Weeks in Treatment: 1 ?Vital Signs ?Height(in): 64 ?Capillary Blood Glucose(mg/dl): 742 ?Weight(lbs): 235 ?Pulse(bpm): 94 ?  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 ?Primary Etiology: ?Cataracts, Sleep Apnea, Hypertension, Cataracts, Sleep Apnea, Hypertension, N/A ?Comorbid History: ?Type II Diabetes, Gout, Osteoarthritis Type II Diabetes, Gout, Osteoarthritis ?03/22/2022 03/22/2022 N/A ?Date Acquired: ?1 1 N/A ?Weeks of Treatment: ?Open Healed - Epithelialized N/A ?Wound Status: ?No No N/A ?Wound Recurrence: ?1.5x2x0.1 0x0x0 N/A ?Measurements L x W x D (cm) ?2.356 0 N/A ?A (cm?) : ?rea ?0.236 0 N/A ?Volume (cm?) : ?87.50% 100.00% N/A ?% Reduction in Area: ?87.50% 100.00% N/A ?% Reduction in Volume: ?Full Thickness Without Exposed Full Thickness Without Exposed N/A ?Classification: ?Support Structures Support Structures ?Medium None Present N/A ?Exudate Amount: ?Serosanguineous N/A N/A ?Exudate Type: ?red, brown N/A N/A ?Exudate Color: ?Flat and Intact Flat and Intact N/A ?Wound Margin: ?Large (67-100%) None Present (0%)  N/A ?Granulation Amount: ?Red, Pink N/A N/A ?Granulation Quality: ?None Present (0%) None Present (0%) N/A ?Necrotic Amount: ?Fat Layer (Subcutaneous Tissue): Yes Fascia: No N/A ?Exposed Structures: ?Fascia: No ?Fat Layer (Subcutaneous Tissue): No ?Tendon: No ?Tendon: No ?Muscle: No ?Muscle: No ?Joint: No ?Joint: No ?Bone: No ?Bone: No ?Medium (34-66%) Large (67-100%) N/A ?Epithelialization: ?Compression Therapy N/A N/A ?Procedures Performed: ?Treatment Notes ?Electronic Signature(s) ?Signed: 04/15/2022 11:42:33 AM By: Duanne Guess MD FACS ?Signed: 04/16/2022 5:38:47 PM By: Zandra Abts RN, BSN ?Entered By: Duanne Guess on 04/15/2022 11:42:33 ?-------------------------------------------------------------------------------- ?Multi-Disciplinary Care Plan Details ?Patient Name: ?Date of Service: ?Jocelyn Camacho, Jocelyn [name] 04/15/2022 10:45 A M ?Medical Record Number: [id number] ?Patient Account Number: [id number] ?Date of Birth/Sex: ?Treating RN: ?January 29, 1946 (76 y.o. [name], [name] ?Primary Care Brysen Shankman: Marinus Maw ?Other Clinician: ?Referring Karie Skowron: ?Treating Rodrick Payson/Extender: Duanne Guess ?[name] ?Weeks in Treatment: 1 ?Multidisciplinary Care Plan reviewed with physician ?Active Inactive ?Nutrition ?Nursing Diagnoses: ?Impaired glucose control: actual or potential ?Potential for alteratiion in Nutrition/Potential for imbalanced nutrition ?Goals: ?Patient/caregiver will maintain therapeutic glucose control ?Date Initiated: 04/08/2022 ?Target Resolution Date: 05/09/2022 ?Goal Status: Active ?Interventions: ?Assess HgA1c results as ordered upon admission and as needed ?Assess patient nutrition upon admission and as needed per policy ?Provide education on elevated blood sugars and impact on wound healing ?Provide education on nutrition ?Treatment Activities: ?Education provided on Nutrition : 04/08/2022 ?Notes: ?Venous Leg Ulcer ?Nursing Diagnoses: ?Knowledge deficit related to disease process and  management ?Potential for venous Insuffiency (use before diagnosis confirmed) ?Goals: ?Non-invasive venous studies are completed as ordered ?Date Initiated: 04/08/2022 ?Target Resolution Date: 05/09/2022 ?Goal Status: Active ?Patie

## 2022-04-16 NOTE — Progress Notes (Signed)
Jocelyn Camacho, Jocelyn Camacho ([id number]) ?Visit Report for 04/15/2022 ?Chief Complaint Document Details ?Patient Name: Date of Service: ?Jocelyn Camacho, Jocelyn [name] 04/15/2022 10:45 A M ?Medical Record Number: [id number] ?Patient Account Number: [id number] ?Date of Birth/Sex: Treating RN: ?29-Nov-1946 (76 y.o. Jocelyn Camacho, Jocelyn Camacho ?Primary Care Provider: [name] Other Clinician: ?Referring Provider: ?Treating Provider/Extender: [name] ?[name] D ?Weeks in Treatment: 1 ?Information Obtained from: Patient ?Chief Complaint ?Patient presents for treatment of open ulcers due to venous insufficiency ?Electronic Signature(s) ?Signed: 04/15/2022 11:42:45 AM By: [name] MD FACS ?Entered By: [name] on 04/15/2022 11:42:45 ?-------------------------------------------------------------------------------- ?HPI Details ?Patient Name: Date of Service: ?Jocelyn Camacho, Jocelyn Camacho [name] 04/15/2022 10:45 A M ?Medical Record Number: [id number] ?Patient Account Number: [id number] ?Date of Birth/Sex: Treating RN: ?1946-05-04 (76 y.o. Jocelyn Camacho, Jocelyn Camacho ?Primary Care Provider: [name] Other Clinician: ?Referring Provider: ?Treating Provider/Extender: [name] ?[name] D ?Weeks in Treatment: 1 ?History of Present Illness ?HPI Description: ADMISSION ?04/08/2022 ?This is a 76 year old woman with a past medical history significant for type 2 diabetes mellitus with stage III chronic kidney disease, essential hypertension, ?obesity, and known venous stasis disease. She says that she has had trouble with her left leg for about the past 6 months. She says that wounds will open ?and then she will get them closed only to have them opened up again. The current episode began in March. She saw her primary care provider on March 14. ?She was prescribed a 2-week course of antibiotics and was advised to apply Betadine sugar to the wounds. She was seen again yesterday and the doxycycline ?extended due to some periwound erythema. Ms.  Tyer has worn compression stockings in the past but they are now at least a year old. She is not certain what ?degree of compression they provided. ABI in clinic today was 1.17. She does not smoke. ?04/15/2022: The proximal wound is closed. The distal wound is much smaller and has a very clean surface. Her venous reflux studies are scheduled for later ?this week. ?Electronic Signature(s) ?Signed: 04/15/2022 11:43:23 AM By: [name] MD FACS ?Entered By: [name] on 04/15/2022 11:43:23 ?-------------------------------------------------------------------------------- ?Physical Exam Details ?Patient Name: Date of Service: ?Jocelyn Camacho, Jocelyn [name] 04/15/2022 10:45 A M ?Medical Record Number: [id number] ?Patient Account Number: [id number] ?Date of Birth/Sex: Treating RN: ?1946-08-23 (76 y.o. Jocelyn Camacho, Jocelyn Camacho ?Primary Care Provider: [name] Other Clinician: ?Referring Provider: ?Treating Provider/Extender: [name] ?[name] D ?Weeks in Treatment: 1 ?Constitutional ?. . . . No acute distress. ?Respiratory ?Normal work of breathing on room air. ?Notes ?04/15/2022: The proximal wound is closed. The distal wound is much smaller and has a clean surface. Periwound skin looks good and edema control is ?acceptable. ?Electronic Signature(s) ?Signed: 04/15/2022 11:46:39 AM By: [name] MD FACS ?Entered By: [name] on 04/15/2022 11:46:38 ?-------------------------------------------------------------------------------- ?Physician Orders Details ?Patient Name: Date of Service: ?Jocelyn Camacho, Jocelyn [name] 04/15/2022 10:45 A M ?Medical Record Number: [id number] ?Patient Account Number: [id number] ?Date of Birth/Sex: Treating RN: ?Jun 07, 1946 (76 y.o. Jocelyn Camacho, Jocelyn Camacho ?Primary Care Provider: [name] Other Clinician: ?Referring Provider: ?Treating Provider/Extender: [name] ?[name] D ?Weeks in Treatment: 1 ?Verbal / Phone Orders: No ?Diagnosis Coding ?ICD-10 Coding ?Code  Description ?L97.321 Non-pressure chronic ulcer of left ankle limited to breakdown of skin ?E11.622 Type 2 diabetes mellitus with other skin ulcer ?I87.8 Other specified disorders of veins ?E66.01 Morbid (severe) obesity due to excess calories ?N18.30 Chronic kidney disease, stage 3 unspecified ?Follow-up Appointments ?ppointment in 1 week. - Dr.  Celine Ahr - Room 2 - Tuesday 5/2 at 10:45 ?Return A ?Nurse Visit: - Thursday after vein study to have leg re-wrapped ?Bathing/ Shower/ Hygiene ?May shower with protection but do not get wound dressing(s) wet. - Ok to use Biomedical engineer, can buy at [facility], [place], or [place] ?Edema Control - Lymphedema / SCD / Other ?Elevate legs to the level of the heart or above for 30 minutes daily and/or when sitting, a frequency of: - throughout the day ?Avoid standing for long periods of time. ?Exercise regularly ?Compression stocking or Garment 20-30 mm/Hg pressure to: - right leg daily ?Wound Treatment ?Wound #1 - Lower Leg Wound Laterality: Left, Lateral ?Cleanser: Soap and Water 1 x Per Week/7 Days ?Discharge Instructions: May shower and wash wound with dial antibacterial soap and water prior to dressing change. ?Cleanser: Wound Cleanser 1 x Per Week/7 Days ?Discharge Instructions: Cleanse the wound with wound cleanser prior to applying a clean dressing using gauze sponges, not tissue or cotton balls. ?Peri-Wound Care: Triamcinolone 15 (g) 1 x Per Week/7 Days ?Discharge Instructions: Use triamcinolone to periwound ?Peri-Wound Care: Sween Lotion (Moisturizing lotion) 1 x Per Week/7 Days ?Discharge Instructions: Apply moisturizing lotion as directed ?Prim Dressing: Promogran Prisma Matrix, 4.34 (sq in) (silver collagen) 1 x Per Week/7 Days ?ary ?Discharge Instructions: Moisten collagen with saline or hydrogel ?Secondary Dressing: ABD Pad, 8x10 1 x Per Week/7 Days ?Discharge Instructions: Apply over primary dressing as directed. ?Secondary Dressing: Woven Gauze Sponge, Non-Sterile 4x4 in  1 x Per Week/7 Days ?Discharge Instructions: Apply over primary dressing as directed. ?Compression Wrap: ThreePress (3 layer compression wrap) 1 x Per Week/7 Days ?Discharge Instructions: Apply three layer compression as directed. ?Electronic Signature(s) ?Signed: 04/15/2022 11:51:50 AM By: [name] MD FACS ?Entered By: [name] on 04/15/2022 11:47:52 ?-------------------------------------------------------------------------------- ?Problem List Details ?Patient Name: ?Date of Service: ?Jocelyn Camacho, Jocelyn [name] 04/15/2022 10:45 A M ?Medical Record Number: [id number] ?Patient Account Number: [id number] ?Date of Birth/Sex: ?Treating RN: ?09/12/1946 (76 y.o. Jocelyn Camacho, Jocelyn Camacho ?Primary Care Provider: [name] ?Other Clinician: ?Referring Provider: ?Treating Provider/Extender: [name] ?[name] D ?Weeks in Treatment: 1 ?Active Problems ?ICD-10 ?Encounter ?Code Description Active Date MDM ?Diagnosis ?L97.321 Non-pressure chronic ulcer of left ankle limited to breakdown of skin 04/08/2022 No Yes ?E11.622 Type 2 diabetes mellitus with other skin ulcer 04/08/2022 No Yes ?I87.8 Other specified disorders of veins 04/08/2022 No Yes ?E66.01 Morbid (severe) obesity due to excess calories 04/08/2022 No Yes ?N18.30 Chronic kidney disease, stage 3 unspecified 04/08/2022 No Yes ?Inactive Problems ?Resolved Problems ?Electronic Signature(s) ?Signed: 04/15/2022 11:41:29 AM By: [name] MD FACS ?Entered By: [name] on 04/15/2022 11:41:29 ?-------------------------------------------------------------------------------- ?Progress Note Details ?Patient Name: Date of Service: ?Jocelyn Camacho, Jocelyn [name] 04/15/2022 10:45 A M ?Medical Record Number: [id number] ?Patient Account Number: [id number] ?Date of Birth/Sex: Treating RN: ?Jul 19, 1946 (76 y.o. Jocelyn Camacho, Jocelyn Camacho ?Primary Care Provider: [name] Other Clinician: ?Referring Provider: ?Treating Provider/Extender: [name] ?[name] D ?Weeks  in Treatment: 1 ?Subjective ?Chief Complaint ?Information obtained from Patient ?Patient presents for treatment of open ulcers due to venous insufficiency ?History of Present Illness (HPI) ?ADMISSION ?04/08/2022

## 2022-04-17 ENCOUNTER — Encounter (HOSPITAL_BASED_OUTPATIENT_CLINIC_OR_DEPARTMENT_OTHER): Payer: Medicare Other | Admitting: General Surgery

## 2022-04-17 ENCOUNTER — Ambulatory Visit (HOSPITAL_COMMUNITY)
Admission: RE | Admit: 2022-04-17 | Discharge: 2022-04-17 | Disposition: A | Discharge status: Home / Self Care | Payer: Medicare Other | Source: Ambulatory Visit | Attending: General Surgery | Admitting: General Surgery

## 2022-04-17 DIAGNOSIS — L97321 Non-pressure chronic ulcer of left ankle limited to breakdown of skin: Secondary | ICD-10-CM | POA: Diagnosis present

## 2022-04-17 DIAGNOSIS — E11622 Type 2 diabetes mellitus with other skin ulcer: Secondary | ICD-10-CM | POA: Diagnosis not present

## 2022-04-21 ENCOUNTER — Encounter (INDEPENDENT_AMBULATORY_CARE_PROVIDER_SITE_OTHER): Payer: Medicare Other | Admitting: Ophthalmology

## 2022-04-21 DIAGNOSIS — H35033 Hypertensive retinopathy, bilateral: Secondary | ICD-10-CM

## 2022-04-21 DIAGNOSIS — I10 Essential (primary) hypertension: Secondary | ICD-10-CM | POA: Diagnosis not present

## 2022-04-21 DIAGNOSIS — H34812 Central retinal vein occlusion, left eye, with macular edema: Secondary | ICD-10-CM | POA: Diagnosis not present

## 2022-04-21 DIAGNOSIS — H43813 Vitreous degeneration, bilateral: Secondary | ICD-10-CM | POA: Diagnosis not present

## 2022-04-21 NOTE — Progress Notes (Signed)
Jocelyn Camacho, Jocelyn Camacho ([id number]) ?Visit Report for 04/17/2022 ?Arrival Information Details ?Patient Name: Date of Service: ?Jocelyn Camacho, Jocelyn [name] 04/17/2022 11:30 A M ?Medical Record Number: [id number] ?Patient Account Number: [id number] ?Date of Birth/Sex: Treating RN: ?06-09-1946 (76 y.o. [name], [name] ?Primary Care Zamora Colton: Marinus Maw Other Clinician: ?Referring Sylvanus Telford: ?Treating Elika Godar/Extender: Duanne Guess ?[name] ?Weeks in Treatment: 1 ?Visit Information History Since Last Visit ?All ordered tests and consults were completed: Yes ?Patient Arrived: Gilmer Mor ?Added or deleted any medications: No ?Arrival Time: 10:29 ?Any new allergies or adverse reactions: No ?Accompanied By: alone ?Had a fall or experienced change in No ?Transfer Assistance: None ?activities of daily living that may affect ?Patient Identification Verified: Yes ?risk of falls: ?Secondary Verification Process Completed: Yes ?Signs or symptoms of abuse/neglect since last visito No ?Patient Requires Transmission-Based Precautions: No ?Hospitalized since last visit: No ?Patient Has Alerts: No ?Implantable device outside of the clinic excluding No ?cellular tissue based products placed in the center ?since last visit: ?Pain Present Now: No ?Electronic Signature(s) ?Signed: 04/21/2022 5:50:11 PM By: Zandra Abts RN, BSN ?Entered By: Zandra Abts on 04/17/2022 10:30:34 ?-------------------------------------------------------------------------------- ?Compression Therapy Details ?Patient Name: Date of Service: ?Jocelyn Camacho, Jocelyn [name] 04/17/2022 11:30 A M ?Medical Record Number: [id number] ?Patient Account Number: [id number] ?Date of Birth/Sex: Treating RN: ?[birth year] (76 y.o. [name], [name] ?Primary Care Tahjanae Blankenburg: Marinus Maw Other Clinician: ?Referring Logen Fowle: ?Treating Belky Mundo/Extender: Duanne Guess ?[name] ?Weeks in Treatment: 1 ?Compression Therapy Performed for Wound Assessment: Wound #1 Left,Lateral Lower  Leg ?Performed By: Clinician Zandra Abts, RN ?Compression Type: Three Layer ?Electronic Signature(s) ?Signed: 04/21/2022 5:50:11 PM By: Zandra Abts RN, BSN ?Entered By: Zandra Abts on 04/17/2022 10:52:03 ?-------------------------------------------------------------------------------- ?Encounter Discharge Information Details ?Patient Name: Date of Service: ?Jocelyn Camacho, Jocelyn [name] 04/17/2022 11:30 A M ?Medical Record Number: [id number] ?Patient Account Number: [id number] ?Date of Birth/Sex: ?Treating RN: ?[birth year] (76 y.o. [name], [name] ?Primary Care Damico Partin: ?Other Clinician: ?[name] ?Referring Joseph Bias: ?Treating Tattianna Schnarr/Extender: Duanne Guess ?[name] ?Weeks in Treatment: 1 ?Encounter Discharge Information Items ?Discharge Condition: Stable ?Ambulatory Status: Gilmer Mor ?Discharge Destination: Home ?Transportation: Private Auto ?Accompanied By: alone ?Schedule Follow-up Appointment: Yes ?Clinical Summary of Care: Patient Declined ?Electronic Signature(s) ?Signed: 04/21/2022 5:50:11 PM By: Zandra Abts RN, BSN ?Entered By: Zandra Abts on 04/17/2022 10:52:57 ?-------------------------------------------------------------------------------- ?Wound Assessment Details ?Patient Name: ?Date of Service: ?Jocelyn Camacho, Jocelyn [name] 04/17/2022 11:30 A M ?Medical Record Number: [id number] ?Patient Account Number: [id number] ?Date of Birth/Sex: ?Treating RN: ?09-27-1946 (76 y.o. [name], [name] ?Primary Care Jolynn Bajorek: Marinus Maw ?Other Clinician: ?Referring Latria Mccarron: ?Treating Raynald Rouillard/Extender: Duanne Guess ?[name] ?Weeks in Treatment: 1 ?Wound Status ?Wound Number: 1 Primary Venous Leg Ulcer ?Etiology: ?Wound Location: Left, Lateral Lower Leg ?Wound Open ?Wounding Event: Blister ?Status: ?Date Acquired: 03/22/2022 ?Comorbid Cataracts, Sleep Apnea, Hypertension, Type II Diabetes, ?Weeks Of Treatment: 1 ?History: Gout, Osteoarthritis ?Clustered Wound: No ?Wound Measurements ?Length: (cm)  1.5 ?Width: (cm) 2 ?Depth: (cm) 0.1 ?Area: (cm?) 2.356 ?Volume: (cm?) 0.236 ?% Reduction in Area: 87.5% ?% Reduction in Volume: 87.5% ?Epithelialization: Medium (34-66%) ?Tunneling: No ?Undermining: No ?Wound Description ?Classification: Full Thickness Without Exposed Support Structures ?Wound Margin: Flat and Intact ?Exudate Amount: Medium ?Exudate Type: Serosanguineous ?Exudate Color: red, brown ?Foul Odor After Cleansing: No ?Slough/Fibrino No ?Wound Bed ?Granulation Amount: Large (67-100%) Exposed Structure ?Granulation Quality: Red, Pink ?Fascia Exposed: No ?Necrotic Amount: None Present (0%) ?Fat Layer (Subcutaneous Tissue) Exposed: Yes ?Tendon Exposed: No ?Muscle Exposed: No ?Joint Exposed: No ?Bone Exposed: No ?  Treatment Notes ?Wound #1 (Lower Leg) Wound Laterality: Left, Lateral ?Cleanser ?Soap and Water ?Discharge Instruction: May shower and wash wound with dial antibacterial soap and water prior to dressing change. ?Wound Cleanser ?Discharge Instruction: Cleanse the wound with wound cleanser prior to applying a clean dressing using gauze sponges, not tissue or cotton ?balls. ?Peri-Wound Care ?Triamcinolone 15 (g) ?Discharge Instruction: Use triamcinolone to periwound ?Sween Lotion (Moisturizing lotion) ?Discharge Instruction: Apply moisturizing lotion as directed ?Topical ?Primary Dressing ?Promogran Prisma Matrix, 4.34 (sq in) (silver collagen) ?Discharge Instruction: Moisten collagen with saline or hydrogel ?Secondary Dressing ?ABD Pad, 8x10 ?Discharge Instruction: Apply over primary dressing as directed. ?Woven Gauze Sponge, Non-Sterile 4x4 in ?Discharge Instruction: Apply over primary dressing as directed. ?Secured With ?Compression Wrap ?ThreePress (3 layer compression wrap) ?Discharge Instruction: Apply three layer compression as directed. ?Compression Stockings ?Add-Ons ?Electronic Signature(s) ?Signed: 04/21/2022 5:50:11 PM By: Zandra Abts RN, BSN ?Entered By: Zandra Abts on 04/17/2022  10:51:45 ?-------------------------------------------------------------------------------- ?Vitals Details ?Patient Name: ?Date of Service: ?Jocelyn Camacho, Jocelyn [name] 04/17/2022 11:30 A M ?Medical Record Number: [id number] ?Patient Account Number: [id number] ?Date of Birth/Sex: ?Treating RN: ?1946/03/05 (76 y.o. [name], [name] ?Primary Care Hrishikesh Hoeg: Marinus Maw ?Other Clinician: ?Referring Elmo Shumard: ?Treating Morton Simson/Extender: Duanne Guess ?[name] ?Weeks in Treatment: 1 ?Vital Signs ?Time Taken: 10:25 ?Temperature (??F): 97.9 ?Height (in): 64 ?Pulse (bpm): 86 ?Weight (lbs): 235 ?Respiratory Rate (breaths/min): 18 ?Body Mass Index (BMI): 40.3 ?Blood Pressure (mmHg): 96/69 ?Reference Range: 80 - 120 mg / dl ?Electronic Signature(s) ?Signed: 04/21/2022 5:50:11 PM By: Zandra Abts RN, BSN ?Entered By: Zandra Abts on 04/17/2022 10:31:07 ?

## 2022-04-21 NOTE — Progress Notes (Signed)
Jocelyn Camacho, Jocelyn Camacho ([id number]) ?Visit Report for 04/17/2022 ?SuperBill Details ?Patient Name: Date of Service: ?Jocelyn Camacho, Jocelyn [name] 04/17/2022 ?Medical Record Number: [id number] ?Patient Account Number: [id number] ?Date of Birth/Sex: Treating RN: ?1946-07-29 (76 y.o. [name], [name] ?Primary Care Provider: [name] Other Clinician: ?Referring Provider: ?Treating Provider/Extender: [name] ?[name] D ?Weeks in Treatment: 1 ?Diagnosis Coding ?ICD-10 Codes ?Code Description ?L97.321 Non-pressure chronic ulcer of left ankle limited to breakdown of skin ?E11.622 Type 2 diabetes mellitus with other skin ulcer ?I87.8 Other specified disorders of veins ?E66.01 Morbid (severe) obesity due to excess calories ?N18.30 Chronic kidney disease, stage 3 unspecified ?Facility Procedures ?CPT4 Code Description Modifier Quantity ?[id number] (Facility Use Only) [phone] - APPLY MULTLAY COMPRS LWR LT LEG 1 ?Electronic Signature(s) ?Signed: 04/17/2022 12:56:37 PM By: [name] MD FACS ?Signed: 04/21/2022 5:50:11 PM By: Levan Hurst RN, BSN ?Entered By: Levan Hurst on 04/17/2022 10:53:24 ?

## 2022-04-22 ENCOUNTER — Encounter (HOSPITAL_BASED_OUTPATIENT_CLINIC_OR_DEPARTMENT_OTHER): Payer: Medicare Other | Attending: General Surgery | Admitting: General Surgery

## 2022-04-22 DIAGNOSIS — Z6841 Body Mass Index (BMI) 40.0 and over, adult: Secondary | ICD-10-CM | POA: Diagnosis not present

## 2022-04-22 DIAGNOSIS — L97321 Non-pressure chronic ulcer of left ankle limited to breakdown of skin: Secondary | ICD-10-CM | POA: Diagnosis present

## 2022-04-22 DIAGNOSIS — I872 Venous insufficiency (chronic) (peripheral): Secondary | ICD-10-CM | POA: Diagnosis not present

## 2022-04-22 DIAGNOSIS — E1122 Type 2 diabetes mellitus with diabetic chronic kidney disease: Secondary | ICD-10-CM | POA: Insufficient documentation

## 2022-04-22 DIAGNOSIS — E11622 Type 2 diabetes mellitus with other skin ulcer: Secondary | ICD-10-CM | POA: Diagnosis not present

## 2022-04-22 DIAGNOSIS — I878 Other specified disorders of veins: Secondary | ICD-10-CM | POA: Diagnosis not present

## 2022-04-22 DIAGNOSIS — I129 Hypertensive chronic kidney disease with stage 1 through stage 4 chronic kidney disease, or unspecified chronic kidney disease: Secondary | ICD-10-CM | POA: Insufficient documentation

## 2022-04-22 DIAGNOSIS — N183 Chronic kidney disease, stage 3 unspecified: Secondary | ICD-10-CM | POA: Insufficient documentation

## 2022-04-22 NOTE — Patient Instructions (Addendum)
Please continue using your CPAP regularly. While your insurance requires that you use CPAP at least 4 hours each night on 70% of the nights, I recommend, that you not skip any nights and use it throughout the night if you can. Getting used to CPAP and staying with the treatment long term does take time and patience and discipline. Untreated obstructive sleep apnea when it is moderate to severe can have an adverse impact on cardiovascular health and raise her risk for heart disease, arrhythmias, hypertension, congestive heart failure, stroke and diabetes. Untreated obstructive sleep apnea causes sleep disruption, nonrestorative sleep, and sleep deprivation. This can have an impact on your day to day functioning and cause daytime sleepiness and impairment of cognitive function, memory loss, mood disturbance, and problems focussing. Using CPAP regularly can improve these symptoms. ? ? ?Please continue to monitor for increased air leak at home. Continue close follow up with DME if you would like to try a different mask. We can order a mask refitting if needed. Please continue discussion of insomnia. Consider sleep hygiene and discuss with PCP option of using valerian root over the counter. Could also consider cognitive behavioral therapy (psychology)  ? ?Follow up with me in 6 months  ?

## 2022-04-22 NOTE — Progress Notes (Signed)
? ? ?PATIENT: Jocelyn Camacho ?DOB: 1946-07-04 ? ?REASON FOR VISIT: follow up ?HISTORY FROM: patient ? ?Chief Complaint  ?Patient presents with  ? Obstructive Sleep Apnea  ?  Rm 1, alone. Here for initial CPAP f/u. Pt reports doing well on CPAP. Pt has been having issues falling asleep. Has been taking melatonin. Pt reports having a wound in her L leg and is getting worked up for, has appt with vein specialist.   ?  ? ?HISTORY OF PRESENT ILLNESS: ? ?04/23/22 ALL:  ?Jocelyn Camacho is a 76 y.o. female here today for follow up for OSA on CPAP.  She was seen in consult with Dr Brett Fairy 10/24/2021 for sleep study to resume CPAP. She had previously used CPAP but been off therapy for about a year due to needing a new machine. PSG 12/03/2021 showed moderate/severe OSA with total AHI 27.4/hr with severe hypoxia with O2 nadir 83% , O2 below 89 for 64 minutes. CPAP titration study 01/15/2022 showed OSA treated well at [place] and hypoxia resolved. AutoPAP ordered. Since, she reports using CPAP most nights. She reports having long standing issue with insomnia. She is taking melatonin $RemoveBeforeDE'10mg'beSGmeHPbgCuhEq$  daily. She hasn't been interested in adding more medications. She has not noticed much benefit of using CPAP. She is followed closely by PCP.  ? ? ? ?HISTORY: (copied from Dohmeier's previous note) ? ?Jocelyn Camacho is a 76 y.o. year old 29 or [place] American female patient seen here as a referral on 10/24/2021 from Dr. Melford Aase for a new evaluation of OSA. Marland Kitchen  ?Chief concern according to patient :  I had a sleep study on [address], $RemoveBefore'@[place]'oGClHyqgseaUX$  Sleep, many years ago- and need a new CPAP. I have not been using my machine for a year now , it wasn't working as well as I thought it should" I was last tested and treated over 10 years ago. ".  ?  ?I have the pleasure of seeing Jocelyn Camacho today, a right-handed [place] or [place] American female with a history of OSA sleep disorder.  She has a past medical history of Allergic rhinitis, Allergy, Anxiety,  Hyperlipidemia, Hypertension, Obesity, OSA (obstructive sleep apnea), Type II or unspecified type diabetes mellitus with renal manifestations, not stated as uncontrolled(250.40), Unspecified hypothyroidism, and Vitamin D deficiency..  ?   ?Sleep relevant medical history: now not on CPAP : Nocturia 2 times , no Tonsillectomy, no cervical spine surgery, no deviated septum . Knee arthritis. DM on insulin.  ?  ? Family medical /sleep history: no  other family member on CPAP with OSA,.  ?  ?Social history:  Patient is retired from [employer] and lives in a household with spouse,  adult daughter lives nearby. No pets.Tobacco use/ .  ETOH use /,  ?Caffeine intake in form of Coffee( 1-2 a week) Soda( /) Tea ( /) or energy drinks. ?Regular exercise in form of -nothing, not a swimmer.   ? ?Sleep habits are as follows: The patient's dinner time is between 6 PM. The patient goes to sleep on a love seat in her den- not reclined- seated. Tv is on , lights on all night-  PM and continues to sleep for intervals of 2 -3 hours,  wakes for 2 bathroom breaks, the first time at 3 AM.   ?The preferred sleep position is seated, with the support of one pillow.  ?Dreams are reportedly rare.  ?7  AM is the usual rise time. The patient wakes up spontaneously.  ?She reports not  feeling unrefreshed or unrestored in AM, with symptoms such as dry mouth, morning headaches, and residual fatigue.  ?Naps are taken infrequently, lasting from 15 to 20 minutes and are refreshing . ? ? ?REVIEW OF SYSTEMS: Out of a complete 14 system review of symptoms, the patient complains only of the following symptoms, insomnia, fatigue, and all other reviewed systems are negative. ? ?Camacho: 4/24 ?FSS: 26/63 ? ?ALLERGIES: ?Allergies  ?Allergen Reactions  ? Penicillins Swelling  ? Sulfa Antibiotics Itching and Rash  ? ? ?HOME MEDICATIONS: ?Outpatient Medications Prior to Visit  ?Medication Sig Dispense Refill  ? allopurinol (ZYLOPRIM) 300 MG tablet TAKE 1/2 TABLET(150 MG) BY  MOUTH DAILY FOR GOUT PREVENTION 45 tablet 2  ? Ascorbic Acid (VITAMIN C) 1000 MG tablet Take 1,000 mg by mouth daily.    ? aspirin EC 81 MG tablet Take 81 mg by mouth daily.    ? atenolol (TENORMIN) 100 MG tablet Take 1 tablet by mouth once daily for blood pressure 90 tablet 0  ? BLACK PEPPER-TURMERIC PO Take by mouth daily.    ? Blood Glucose Monitoring Suppl (ONE TOUCH ULTRA 2) w/Device KIT check blood sugar 3 times daily-DX-E11.21 1 kit 0  ? Cholecalciferol (VITAMIN D3) 5000 units CAPS Take 10,000 Units by mouth daily. Taking 5,000 units    ? colchicine 0.6 MG tablet TAKE 1 TABLET BY MOUTH DAILY FOR GOUT PREVENTION 90 tablet 3  ? Cyanocobalamin (B-12) 1000 MCG SUBL Place 1 tablet under the tongue 2 (two) times a week.    ? dorzolamide-timolol (COSOPT) 22.3-6.8 MG/ML ophthalmic solution Place 1 drop into the left eye 2 (two) times daily.    ? doxycycline (VIBRAMYCIN) 100 MG capsule Take 1 capsule (100 mg total) by mouth 2 (two) times daily. 14 capsule 1  ? Dulaglutide (TRULICITY) 3 WU/9.8JX SOPN Inject  3 MG(0.5 ML) into the skin  weekly for Diabetes (Dx: e10.29) 6 mL 3  ? Finerenone (KERENDIA) 10 MG TABS Take 10 mg by mouth daily. Take  1 tablet  Daily  for Diabetic Kidneys 90 tablet 0  ? furosemide (LASIX) 80 MG tablet TAKE 1 TABLET BY MOUTH 1 TO 2 TIMES PER DAY AS NEEDED FOR FLUID RETENTION OR ANKLE SWELLING 180 tablet 3  ? hyoscyamine (LEVSIN, ANASPAZ) 0.125 MG tablet Take 1 tablet (0.125 mg total) by mouth every 6 (six) hours as needed. 60 tablet 1  ? insulin NPH-regular Human (70-30) 100 UNIT/ML injection Patient injects 25 units in the AM and 10 units in the PM. Reduce each insulin dose by 2 units if glucose persistently <100 10 mL   ? Lancets (ONETOUCH ULTRASOFT) lancets Check blood sugar 3 times daily-DX-E10.9 300 each 1  ? levothyroxine (SYNTHROID) 125 MCG tablet Take  1 tablet  Daily  on an empty stomach with only water for 30 minutes & no Antacid meds, Calcium or Magnesium for 4 hours & avoid Biotin 90  tablet 3  ? losartan (COZAAR) 100 MG tablet Take 1 tablet (100 mg total) by mouth daily. 90 tablet 2  ? ONETOUCH ULTRA test strip CHECK BLOOD SUGAR 3 TIMES A DAY 300 strip 3  ? polymyxin B 500,000 Units in sodium chloride irrigation 0.9 % 500 mL Irrigate with 1 application as directed every 30 (thirty) days. When getting her eye inj.    ? rosuvastatin (CRESTOR) 5 MG tablet Take 1 tablet once weekly as directed for Cholesterol LDL goal less than 70. 13 tablet 3  ? zinc gluconate 50 MG tablet Take 50  mg by mouth daily.    ? ALPRAZolam (XANAX) 0.5 MG tablet Take 1 tablet (0.5 mg total) by mouth at bedtime as needed for anxiety. 2 tablet 0  ? ?No facility-administered medications prior to visit.  ? ? ?PAST MEDICAL HISTORY: ?Past Medical History:  ?Diagnosis Date  ? Allergic rhinitis   ? Allergy   ? Anxiety   ? with medical procedures  ? Hyperlipidemia   ? Hypertension   ? Obesity   ? OSA (obstructive sleep apnea)   ? wears CPAP  ? Type II or unspecified type diabetes mellitus with renal manifestations, not stated as uncontrolled(250.40)   ? Unspecified hypothyroidism   ? Vitamin D deficiency   ? ? ?PAST SURGICAL HISTORY: ?Past Surgical History:  ?Procedure Laterality Date  ?  eye procedure    ? have treatment where a needle is stuck into left eye- since June 2014; now every six weeks  ? DENTAL EXAMINATION UNDER ANESTHESIA    ? TUBAL LIGATION    ? ? ?FAMILY HISTORY: ?Family History  ?Problem Relation Age of Onset  ? Breast cancer Mother   ? Renal Disease Mother   ? Hypertension Sister   ? Diabetes Sister   ? Diabetes Maternal Grandmother   ? Colon cancer Neg Hx   ? Esophageal cancer Neg Hx   ? Stomach cancer Neg Hx   ? Rectal cancer Neg Hx   ? ? ?SOCIAL HISTORY: ?Social History  ? ?Socioeconomic History  ? Marital status: Married  ?  Spouse name: Juanda Crumble  ? Number of children: 1  ? Years of education: Not on file  ? Highest education level: Some college, no degree  ?Occupational History  ? Occupation: Retired  ?Tobacco  Use  ? Smoking status: Never  ? Smokeless tobacco: Never  ?Vaping Use  ? Vaping Use: Never used  ?Substance and Sexual Activity  ? Alcohol use: Not Currently  ?  Comment: very rarely  ? Drug use: No  ?

## 2022-04-23 ENCOUNTER — Ambulatory Visit (INDEPENDENT_AMBULATORY_CARE_PROVIDER_SITE_OTHER): Payer: Medicare Other | Admitting: Family Medicine

## 2022-04-23 ENCOUNTER — Encounter: Payer: Self-pay | Admitting: Family Medicine

## 2022-04-23 VITALS — BP 120/68 | HR 87 | Ht 62.0 in | Wt 237.0 lb

## 2022-04-23 DIAGNOSIS — G4733 Obstructive sleep apnea (adult) (pediatric): Secondary | ICD-10-CM

## 2022-04-23 DIAGNOSIS — Z9989 Dependence on other enabling machines and devices: Secondary | ICD-10-CM

## 2022-04-23 NOTE — Progress Notes (Signed)
Jocelyn Camacho, Jocelyn Camacho ([id number]) ?Visit Report for 04/22/2022 ?Chief Complaint Document Details ?Patient Name: Date of Service: ?Jocelyn Camacho, Jocelyn Camacho [name] 04/22/2022 10:45 A M ?Medical Record Number: [id number] ?Patient Account Number: [id number] ?Date of Birth/Sex: Treating RN: ?1946/01/25 (76 y.o. Jocelyn Camacho, [name] ?Primary Care Provider: Marinus Maw Other Clinician: ?Referring Provider: ?Treating Provider/Extender: Duanne Guess ?[name] ?Weeks in Treatment: 2 ?Information Obtained from: Patient ?Chief Complaint ?Patient presents for treatment of open ulcers due to venous insufficiency ?Electronic Signature(s) ?Signed: 04/22/2022 11:24:20 AM By: Duanne Guess MD FACS ?Entered By: Duanne Guess on 04/22/2022 11:24:20 ?-------------------------------------------------------------------------------- ?HPI Details ?Patient Name: Date of Service: ?Jocelyn Camacho, Jocelyn Camacho [name] 04/22/2022 10:45 A M ?Medical Record Number: [id number] ?Patient Account Number: [id number] ?Date of Birth/Sex: Treating RN: ?09/20/1946 (76 y.o. 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 She says that she has had trouble with her left leg for about the past 6 months. She says that wounds will open ?and then she will get them closed only to have them opened up again. The current episode began in March. She saw her primary care provider on March 14. ?She was prescribed a 2-week course of antibiotics and was advised to apply Betadine sugar to the wounds. She was seen again yesterday and the doxycycline ?extended due to some periwound erythema. Jocelyn Camacho  has worn compression stockings in the past but they are now at least a year old. She is not certain what ?degree of compression they provided. ABI in clinic today was 1.17. She does not smoke. ?04/15/2022: The proximal wound is closed. The distal wound is much smaller and has a very clean surface. Her venous reflux studies are scheduled for later ?this week. ?04/22/2022: Her wounds are closed today. The results of her reflux studies done last week are copied here: ?Summary: ?Right: ?- No evidence of deep vein thrombosis seen in the right lower extremity, from the common femoral through the popliteal veins. ?- No evidence of superficial venous thrombosis in the right lower extremity. ?- Venous reflux is noted in the right greater saphenous vein in the calf. ?Left: ?- No evidence of deep vein thrombosis seen in the left lower extremity, from the common femoral through the popliteal veins. ?- No evidence of superficial venous thrombosis in the left lower extremity. ?- Venous reflux is noted in the left sapheno-femoral junction. ?- Venous reflux is noted in the left greater saphenous vein in the thigh. ?- Venous reflux is noted in the left short saphenous vein. ?She has her compression stockings with her. ?Electronic Signature(s) ?Signed: 04/22/2022 11:25:14 AM By: Duanne Guess MD FACS ?Entered By: Duanne Guess on 04/22/2022 11:25:14 ?-------------------------------------------------------------------------------- ?Physical Exam Details ?Patient Name: Date of Service: ?Jocelyn Camacho, Jocelyn Camacho [name] 04/22/2022 10:45 A M ?Medical Record Number: [id number] ?Patient Account Number: [id number] ?Date of Birth/Sex: Treating RN: ?May 06, 1946 (76 y.o. Jocelyn Camacho, [name] ?Primary Care Provider: Marinus Maw Other Clinician: ?Referring Provider: ?Treating Provider/Extender: Duanne Guess ?[name] ?Weeks in Treatment: 2 ?Constitutional ?. . . . No acute distress. ?Respiratory ?Normal work of breathing on room  air. ?Notes ?04/22/2022: Her wounds are healed. ?Electronic Signature(s) ?Signed: 04/22/2022 11:26:43 AM By: Duanne Guess MD FACS ?Entered By: Lady Gary,  Emillio Ngo on 04/22/2022 11:26:43 ?-------------------------------------------------------------------------------- ?Physician Orders Details ?Patient Name: Date of Service: ?Jocelyn Camacho, Jocelyn Camacho [name] 04/22/2022 10:45 A M ?Medical Record Number: [id number] ?Patient Account Number: [id number] ?Date of Birth/Sex: Treating RN: ?1946-11-14 (76 y.o. Jocelyn Camacho, [name] ?Primary Care Provider: Marinus Maw Other Clinician: ?Referring Provider: ?Treating Provider/Extender: Duanne Guess ?[name] ?Weeks in Treatment: 2 ?Verbal / Phone Orders: No ?Diagnosis Coding ?ICD-10 Coding ?Code Description ?L97.321 Non-pressure chronic ulcer of left ankle limited to breakdown of skin ?E11.622 Type 2 diabetes mellitus with other skin ulcer ?I87.8 Other specified disorders of veins ?E66.01 Morbid (severe) obesity due to excess calories ?N18.30 Chronic kidney disease, stage 3 unspecified ?Discharge From [facility] ?Discharge from [facility] - Wound healed, congratulations!! ?Edema Control - Lymphedema / SCD / Other ?Elevate legs to the level of the heart or above for 30 minutes daily and/or when sitting, a frequency of: - throughout the day ?Avoid standing for long periods of time. ?Exercise regularly ?Moisturize legs daily. ?Compression stocking or Garment 20-30 mm/Hg pressure to: - both legs daily ?Consults ?Vascular Surgeon - Abnormal reflux study, evaluate for possible ablation - (ICD10 I87.8 - Other specified disorders of veins) ?Electronic Signature(s) ?Signed: 04/22/2022 11:26:53 AM By: Duanne Guess MD FACS ?Entered By: Duanne Guess on 04/22/2022 11:26:53 ?-------------------------------------------------------------------------------- ?Problem List Details ?Patient Name: ?Date of Service: ?Jocelyn Camacho, Jocelyn Camacho [name] 04/22/2022 10:45 A M ?Medical Record Number:  [id number] ?Patient Account Number: [id number] ?Date of Birth/Sex: ?Treating RN: ?January 26, 1946 (76 y.o. Jocelyn Camacho, [name] ?Primary Care Provider: Marinus Maw ?Other Clinician: ?Referring Provider: ?Treating Provider/Extender: Duanne Guess ?[name] ?Weeks in Treatment: 2 ?Active Problems ?ICD-10 ?Encounter ?Code Description Active Date MDM ?Diagnosis ?L97.321 Non-pressure chronic ulcer of left ankle limited to breakdown of skin 04/08/2022 No Yes ?E11.622 Type 2 diabetes mellitus with other skin ulcer 04/08/2022 No Yes ?I87.8 Other specified disorders of veins 04/08/2022 No Yes ?E66.01 Morbid (severe) obesity due to excess calories 04/08/2022 No Yes ?N18.30 Chronic kidney disease, stage 3 unspecified 04/08/2022 No Yes ?Inactive Problems ?Resolved Problems ?Electronic Signature(s) ?Signed: 04/22/2022 11:24:06 AM By: Duanne Guess MD FACS ?Entered By: Duanne Guess on 04/22/2022 11:24:06 ?-------------------------------------------------------------------------------- ?Progress Note Details ?Patient Name: ?Date of Service: ?Jocelyn Camacho, Jocelyn Camacho [name] 04/22/2022 10:45 A M ?Medical Record Number: [id number] ?Patient Account Number: [id number] ?Date of Birth/Sex: ?Treating RN: ?1945-12-31 (76 y.o. 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 She says that she has had trouble with her left leg for about the past 6 months. She says that wounds will open ?and then she will get them closed only  to have them opened up again. The current episode began in March. She saw her primary care provider on March 14. ?She was prescribed a 2-week course of antibiotics and was advised to apply Betadine sugar to the wounds. She was seen again

## 2022-05-02 ENCOUNTER — Encounter: Payer: Self-pay | Admitting: Vascular Surgery

## 2022-05-02 ENCOUNTER — Ambulatory Visit (INDEPENDENT_AMBULATORY_CARE_PROVIDER_SITE_OTHER): Payer: Medicare Other | Admitting: Vascular Surgery

## 2022-05-02 VITALS — BP 137/81 | HR 91 | Temp 97.6°F | Resp 14 | Ht 62.0 in | Wt 238.0 lb

## 2022-05-02 DIAGNOSIS — I872 Venous insufficiency (chronic) (peripheral): Secondary | ICD-10-CM | POA: Diagnosis not present

## 2022-05-02 NOTE — Progress Notes (Signed)
? ?ASSESSMENT & PLAN  ? ?CHRONIC VENOUS INSUFFICIENCY: This patient has CEAP C5 venous disease.  She has excellent arterial flow.  We have discussed the importance of intermittent leg elevation and the proper positioning for this.  I have encouraged her to continue to wear her knee-high compression stocking with a gradient of 20 to 30 mmHg in order to lower her risk of developing venous ulcers in the future.  I encouraged her to stay as active as possible.  Specifically we discussed the importance of walking and water aerobics which I think is very helpful for patients with venous disease.  I encouraged her to avoid prolonged sitting and standing.  We discussed the importance of maintaining a healthy weight as central obesity especially increases lower extremity venous pressure.  If she develops worsening venous symptoms or recurrent ulcer I think she should be considered for laser ablation of the left great saphenous vein in the thigh.  I have discussed this with her and she will call if she develops recurrent ulceration. ? ?REASON FOR CONSULT:   ? ?Chronic venous insufficiency.  The consult is requested by Dr. [name].  ? ?HPI:  ? ?Jocelyn Camacho is a 76 y.o. female who is referred with chronic venous insufficiency.  I have reviewed the records from the referring office.  The patient was seen at the [facility].  Patient is followed with a venous stasis ulcers. ? ?On my history, the patient has had a wound on the left leg off and on for 6 months.  Patient was followed at the wound care center ultimately the wound healed.  She does not describe significant pain in her legs with standing.  She does describe issues with swelling more significantly on the left side.  She denies any previous history of DVT.  She does wear a compression stocking.  She believes this is a knee-high 20-30 stocking.  She also tries to elevate her legs some. ? ?I do not get any history of claudication although I  think her activity is fairly limited.  She denies any history of rest pain. ? ?Past Medical History:  ?Diagnosis Date  ? Allergic rhinitis   ? Allergy   ? Anxiety   ? with medical procedures  ? Hyperlipidemia   ? Hypertension   ? Obesity   ? OSA (obstructive sleep apnea)   ? wears CPAP  ? Type II or unspecified type diabetes mellitus with renal manifestations, not stated as uncontrolled(250.40)   ? Unspecified hypothyroidism   ? Vitamin D deficiency   ? ? ?Family History  ?Problem Relation Age of Onset  ? Breast cancer Mother   ? Renal Disease Mother   ? Hypertension Sister   ? Diabetes Sister   ? Diabetes Maternal Grandmother   ? Colon cancer Neg Hx   ? Esophageal cancer Neg Hx   ? Stomach cancer Neg Hx   ? Rectal cancer Neg Hx   ? ? ?SOCIAL HISTORY: ?Social History  ? ?Tobacco Use  ? Smoking status: Never  ? Smokeless tobacco: Never  ?Substance Use Topics  ? Alcohol use: Not Currently  ?  Comment: very rarely  ? ? ?Allergies  ?Allergen Reactions  ? Penicillins Swelling  ? Sulfa Antibiotics Itching and Rash  ? ? ?Current Outpatient Medications  ?Medication Sig Dispense Refill  ? allopurinol (ZYLOPRIM) 300 MG tablet TAKE 1/2 TABLET(150 MG) BY MOUTH DAILY FOR GOUT PREVENTION 45 tablet 2  ? Ascorbic Acid (VITAMIN C) 1000  MG tablet Take 1,000 mg by mouth daily.    ? aspirin EC 81 MG tablet Take 81 mg by mouth daily.    ? atenolol (TENORMIN) 100 MG tablet Take 1 tablet by mouth once daily for blood pressure 90 tablet 0  ? BLACK PEPPER-TURMERIC PO Take by mouth daily.    ? Blood Glucose Monitoring Suppl (ONE TOUCH ULTRA 2) w/Device KIT check blood sugar 3 times daily-DX-E11.21 1 kit 0  ? Cholecalciferol (VITAMIN D3) 5000 units CAPS Take 10,000 Units by mouth daily. Taking 5,000 units    ? colchicine 0.6 MG tablet TAKE 1 TABLET BY MOUTH DAILY FOR GOUT PREVENTION 90 tablet 3  ? Cyanocobalamin (B-12) 1000 MCG SUBL Place 1 tablet under the tongue 2 (two) times a week.    ? dorzolamide-timolol (COSOPT) 22.3-6.8 MG/ML  ophthalmic solution Place 1 drop into the left eye 2 (two) times daily.    ? Dulaglutide (TRULICITY) 3 TD/3.2KG SOPN Inject  3 MG(0.5 ML) into the skin  weekly for Diabetes (Dx: e10.29) 6 mL 3  ? Finerenone (KERENDIA) 10 MG TABS Take 10 mg by mouth daily. Take  1 tablet  Daily  for Diabetic Kidneys 90 tablet 0  ? furosemide (LASIX) 80 MG tablet TAKE 1 TABLET BY MOUTH 1 TO 2 TIMES PER DAY AS NEEDED FOR FLUID RETENTION OR ANKLE SWELLING 180 tablet 3  ? hyoscyamine (LEVSIN, ANASPAZ) 0.125 MG tablet Take 1 tablet (0.125 mg total) by mouth every 6 (six) hours as needed. 60 tablet 1  ? insulin NPH-regular Human (70-30) 100 UNIT/ML injection Patient injects 25 units in the AM and 10 units in the PM. Reduce each insulin dose by 2 units if glucose persistently <100 10 mL   ? Lancets (ONETOUCH ULTRASOFT) lancets Check blood sugar 3 times daily-DX-E10.9 300 each 1  ? levothyroxine (SYNTHROID) 125 MCG tablet Take  1 tablet  Daily  on an empty stomach with only water for 30 minutes & no Antacid meds, Calcium or Magnesium for 4 hours & avoid Biotin 90 tablet 3  ? losartan (COZAAR) 100 MG tablet Take 1 tablet (100 mg total) by mouth daily. 90 tablet 2  ? ONETOUCH ULTRA test strip CHECK BLOOD SUGAR 3 TIMES A DAY 300 strip 3  ? polymyxin B 500,000 Units in sodium chloride irrigation 0.9 % 500 mL Irrigate with 1 application as directed every 30 (thirty) days. When getting her eye inj.    ? rosuvastatin (CRESTOR) 5 MG tablet Take 1 tablet once weekly as directed for Cholesterol LDL goal less than 70. 13 tablet 3  ? zinc gluconate 50 MG tablet Take 50 mg by mouth daily.    ? doxycycline (VIBRAMYCIN) 100 MG capsule Take 1 capsule (100 mg total) by mouth 2 (two) times daily. (Patient not taking: Reported on 05/02/2022) 14 capsule 1  ? ?No current facility-administered medications for this visit.  ? ? ?REVIEW OF SYSTEMS:  ?[X] denotes positive finding, [ ] denotes negative finding ?Cardiac  Comments:  ?Chest pain or chest pressure:     ?Shortness of breath upon exertion:    ?Short of breath when lying flat:    ?Irregular heart rhythm:    ?    ?Vascular    ?Pain in calf, thigh, or hip brought on by ambulation:    ?Pain in feet at night that wakes you up from your sleep:     ?Blood clot in your veins:    ?Leg swelling:  x   ?    ?  Pulmonary    ?Oxygen at home:    ?Productive cough:     ?Wheezing:     ?    ?Neurologic    ?Sudden weakness in arms or legs:     ?Sudden numbness in arms or legs:     ?Sudden onset of difficulty speaking or slurred speech:    ?Temporary loss of vision in one eye:     ?Problems with dizziness:     ?    ?Gastrointestinal    ?Blood in stool:     ?Vomited blood:     ?    ?Genitourinary    ?Burning when urinating:     ?Blood in urine:    ?    ?Psychiatric    ?Major depression:     ?    ?Hematologic    ?Bleeding problems:    ?Problems with blood clotting too easily:    ?    ?Skin    ?Rashes or ulcers:    ?    ?Constitutional    ?Fever or chills:    ?- ? ?PHYSICAL EXAM:  ? ?Vitals:  ? 05/02/22 1315  ?BP: 137/81  ?Pulse: 91  ?Resp: 14  ?Temp: 97.6 ?F (36.4 ?C)  ?TempSrc: Temporal  ?SpO2: 100%  ?Weight: 238 lb (108 kg)  ?Height: 5' 2" (1.575 m)  ? ?Body mass index is 43.53 kg/m?. ?GENERAL: The patient is a well-nourished female, in no acute distress. The vital signs are documented above. ?CARDIAC: There is a regular rate and rhythm.  ?VASCULAR: I do not detect carotid bruits. ?She has palpable pedal pulses and a biphasic dorsalis pedis and posterior tibial signal bilaterally. ?She has hyperpigmentation. ?The wound on the lateral aspect of her left leg has healed. ? ? ?I did look at her great saphenous vein myself with the SonoSite and she does have reflux in the thigh and the vein is dilated.  It is about 5 mm in diameter. ?PULMONARY: There is good air exchange bilaterally without wheezing or rales. ?ABDOMEN: Soft and non-tender with normal pitched bowel sounds.  ?MUSCULOSKELETAL: There are no major deformities. ?NEUROLOGIC: No  focal weakness or paresthesias are detected. ?SKIN: There are no ulcers or rashes noted. ?PSYCHIATRIC: The patient has a normal affect. ? ?DATA:   ? ?VENOUS DUPLEX: I have reviewed the venous duplex scan that was don

## 2022-05-18 ENCOUNTER — Other Ambulatory Visit: Payer: Self-pay | Admitting: Adult Health

## 2022-05-18 DIAGNOSIS — M1A9XX0 Chronic gout, unspecified, without tophus (tophi): Secondary | ICD-10-CM

## 2022-05-18 DIAGNOSIS — E1122 Type 2 diabetes mellitus with diabetic chronic kidney disease: Secondary | ICD-10-CM

## 2022-05-18 DIAGNOSIS — Z794 Long term (current) use of insulin: Secondary | ICD-10-CM

## 2022-05-26 ENCOUNTER — Encounter (INDEPENDENT_AMBULATORY_CARE_PROVIDER_SITE_OTHER): Payer: Medicare Other | Admitting: Ophthalmology

## 2022-05-26 ENCOUNTER — Ambulatory Visit: Payer: Medicare Other | Admitting: Family Medicine

## 2022-05-26 DIAGNOSIS — H35033 Hypertensive retinopathy, bilateral: Secondary | ICD-10-CM | POA: Diagnosis not present

## 2022-05-26 DIAGNOSIS — H34812 Central retinal vein occlusion, left eye, with macular edema: Secondary | ICD-10-CM | POA: Diagnosis not present

## 2022-05-26 DIAGNOSIS — I10 Essential (primary) hypertension: Secondary | ICD-10-CM | POA: Diagnosis not present

## 2022-05-26 DIAGNOSIS — H43813 Vitreous degeneration, bilateral: Secondary | ICD-10-CM | POA: Diagnosis not present

## 2022-06-03 NOTE — Progress Notes (Signed)
Tendon Exposed: No Muscle Exposed: No Joint Exposed: No Bone Exposed: No Electronic Signature(s) Signed: 04/22/2022 6:08:29 PM By: Jocelyn Hurst RN, BSN Entered By: Jocelyn Camacho on 04/22/2022 11:01:42 -------------------------------------------------------------------------------- [place] Details Patient Name: Date of Service: Jocelyn Camacho. 04/22/2022 10:45 A M Medical Record Number: [id number] Patient Account Number: [id number] Date of Birth/Sex: Treating RN: [birth year] (76 y.o. Jocelyn Camacho Primary Care Jocelyn Camacho: Jocelyn Camacho Other Clinician: Referring Jocelyn Camacho: Treating Jocelyn Camacho/Extender: Jocelyn Camacho in Treatment: 2 Vital  Signs Time Taken: 10:49 Temperature (F): 97.7 Height (in): 64 Pulse (bpm): 90 Weight (lbs): 235 Respiratory Rate (breaths/min): 18 Body Mass Index (BMI): 40.3 Blood Pressure (mmHg): 113/71 Reference Range: 80 - 120 mg / dl Electronic Signature(s) Signed: 06/03/2022 8:46:54 AM By: Jocelyn Camacho Entered By: Jocelyn Camacho on 04/22/2022 10:50:00  Tendon Exposed: No Muscle Exposed: No Joint Exposed: No Bone Exposed: No Electronic Signature(s) Signed: 04/22/2022 6:08:29 PM By: Jocelyn Hurst RN, BSN Entered By: Jocelyn Camacho on 04/22/2022 11:01:42 -------------------------------------------------------------------------------- [place] Details Patient Name: Date of Service: Jocelyn Camacho. 04/22/2022 10:45 A M Medical Record Number: [id number] Patient Account Number: [id number] Date of Birth/Sex: Treating RN: [birth year] (76 y.o. Jocelyn Camacho Primary Care Jocelyn Camacho: Jocelyn Camacho Other Clinician: Referring Jocelyn Camacho: Treating Jocelyn Camacho/Extender: Jocelyn Camacho in Treatment: 2 Vital  Signs Time Taken: 10:49 Temperature (F): 97.7 Height (in): 64 Pulse (bpm): 90 Weight (lbs): 235 Respiratory Rate (breaths/min): 18 Body Mass Index (BMI): 40.3 Blood Pressure (mmHg): 113/71 Reference Range: 80 - 120 mg / dl Electronic Signature(s) Signed: 06/03/2022 8:46:54 AM By: Jocelyn Camacho Entered By: Jocelyn Camacho on 04/22/2022 10:50:00  N/A N/A Wound Status: No N/A N/A Wound Recurrence: 0x0x0 N/A N/A Measurements L x W x D (cm) 0 N/A N/A A (cm) : rea 0 N/A N/A Volume (cm) : 100.00% N/A N/A % Reduction in Area: 100.00% N/A N/A % Reduction in Volume: Full Thickness Without Exposed N/A N/A Classification: Support Structures None Present N/A N/A Exudate Amount: Flat and Intact N/A N/A Wound Margin: None Present (0%) N/A N/A Granulation Amount: None Present (0%) N/A N/A Necrotic Amount: Fascia: No N/A N/A Exposed Structures: Fat Layer (Subcutaneous Tissue): No Tendon: No Muscle: No Joint: No Bone: No Large (67-100%) N/A N/A Epithelialization: Treatment Notes Electronic Signature(s) Signed: 04/22/2022 11:24:12 AM By: Jocelyn Maudlin MD FACS Signed: 04/22/2022 6:08:29 PM By: Jocelyn Hurst RN, BSN Entered By: Jocelyn Camacho on 04/22/2022 11:24:12 -------------------------------------------------------------------------------- Multi-Disciplinary Care Plan Details Patient Name: Date of Service: Jocelyn Camacho. 04/22/2022 10:45 A M Medical Record Number: [id number] Patient Account Number: [id number] Date of Birth/Sex: Treating RN: 03/11/1946 (76 y.o. Jocelyn Camacho Primary Care Jocelyn Camacho: Jocelyn Camacho Other Clinician: Referring Jocelyn Camacho: Treating Jocelyn Camacho/Extender: Jocelyn Camacho in Treatment: 2 Multidisciplinary Care Plan reviewed with physician Active  Inactive Electronic Signature(s) Signed: 04/22/2022 6:08:29 PM By: Jocelyn Hurst RN, BSN Entered By: Jocelyn Camacho on 04/22/2022 16:56:25 -------------------------------------------------------------------------------- Pain Assessment Details Patient Name: Date of Service: Jocelyn Camacho 04/22/2022 10:45 A M Medical Record Number: [id number] Patient Account Number: [id number] Date of Birth/Sex: Treating RN: 19-Apr-1946 (76 y.o. Jocelyn Camacho Primary Care Jocelyn Camacho: Jocelyn Camacho Other Clinician: Referring Jocelyn Camacho: Treating Jocelyn Camacho/Extender: Jocelyn Camacho in Treatment: 2 Active Problems Location of Pain Severity and Description of Pain Patient Has Paino No Site Locations Pain Management and Medication Current Pain Management: Electronic Signature(s) Signed: 04/22/2022 6:08:29 PM By: Jocelyn Hurst RN, BSN Signed: 06/03/2022 8:46:54 AM By: Jocelyn Camacho Entered By: Jocelyn Camacho on 04/22/2022 10:50:08 -------------------------------------------------------------------------------- Patient/Caregiver Education Details Patient Name: Date of Service: Jocelyn Camacho 5/2/2023andnbsp10:45 A M Medical Record Number: [id number] Patient Account Number: [id number] Date of Birth/Gender: Treating RN: [birth year] (76 y.o. Jocelyn Camacho Primary Care Physician: Jocelyn Camacho Other Clinician: Referring Physician: Treating Physician/Extender: Jocelyn Camacho in Treatment: 2 Education Assessment Education Provided To: Patient Education Topics Provided Wound/Skin Impairment: Methods: Explain/Verbal Responses: State content correctly Electronic Signature(s) Signed: 04/22/2022 6:08:29 PM By: Jocelyn Hurst RN, BSN Entered By: Jocelyn Camacho on 04/22/2022 16:56:34 -------------------------------------------------------------------------------- Wound Assessment Details Patient Name: Date of Service: Jocelyn Camacho.  04/22/2022 10:45 A M Medical Record Number: [id number] Patient Account Number: [id number] Date of Birth/Sex: Treating RN: Aug 16, 1946 (76 y.o. Jocelyn Camacho Primary Care Jocelyn Camacho: Jocelyn Camacho Other Clinician: Referring Leevon Camacho: Treating Jocelyn Camacho/Extender: Jocelyn Camacho Weeks in Treatment: 2 Wound Status Wound Number: 1 Primary Venous Leg Ulcer Etiology: Wound Location: Left, Lateral Lower Leg Wound Status: Open Wounding Event: Blister Comorbid Cataracts, Sleep Apnea, Hypertension, Type II Diabetes, Date Acquired: 03/22/2022 History: Gout, Osteoarthritis Weeks Of Treatment: 2 Clustered Wound: No Photos Wound Measurements Length: (cm) Width: (cm) Depth: (cm) Area: (cm) Volume: (cm) 0 % Reduction in Area: 100% 0 % Reduction in Volume: 100% 0 Epithelialization: Large (67-100%) 0 Tunneling: No 0 Undermining: No Wound Description Classification: Full Thickness Without Exposed Support Structures Wound Margin: Flat and Intact Exudate Amount: None Present Foul Odor After Cleansing: No Slough/Fibrino No Wound Bed Granulation Amount: None Present (0%) Exposed Structure Necrotic Amount: None Present (0%) Fascia Exposed: No Fat Layer (Subcutaneous Tissue) Exposed: No  N/A N/A Wound Status: No N/A N/A Wound Recurrence: 0x0x0 N/A N/A Measurements L x W x D (cm) 0 N/A N/A A (cm) : rea 0 N/A N/A Volume (cm) : 100.00% N/A N/A % Reduction in Area: 100.00% N/A N/A % Reduction in Volume: Full Thickness Without Exposed N/A N/A Classification: Support Structures None Present N/A N/A Exudate Amount: Flat and Intact N/A N/A Wound Margin: None Present (0%) N/A N/A Granulation Amount: None Present (0%) N/A N/A Necrotic Amount: Fascia: No N/A N/A Exposed Structures: Fat Layer (Subcutaneous Tissue): No Tendon: No Muscle: No Joint: No Bone: No Large (67-100%) N/A N/A Epithelialization: Treatment Notes Electronic Signature(s) Signed: 04/22/2022 11:24:12 AM By: Jocelyn Maudlin MD FACS Signed: 04/22/2022 6:08:29 PM By: Jocelyn Hurst RN, BSN Entered By: Jocelyn Camacho on 04/22/2022 11:24:12 -------------------------------------------------------------------------------- Multi-Disciplinary Care Plan Details Patient Name: Date of Service: Jocelyn Camacho. 04/22/2022 10:45 A M Medical Record Number: [id number] Patient Account Number: [id number] Date of Birth/Sex: Treating RN: 03/11/1946 (76 y.o. Jocelyn Camacho Primary Care Jocelyn Camacho: Jocelyn Camacho Other Clinician: Referring Jocelyn Camacho: Treating Jocelyn Camacho/Extender: Jocelyn Camacho in Treatment: 2 Multidisciplinary Care Plan reviewed with physician Active  Inactive Electronic Signature(s) Signed: 04/22/2022 6:08:29 PM By: Jocelyn Hurst RN, BSN Entered By: Jocelyn Camacho on 04/22/2022 16:56:25 -------------------------------------------------------------------------------- Pain Assessment Details Patient Name: Date of Service: Jocelyn Camacho 04/22/2022 10:45 A M Medical Record Number: [id number] Patient Account Number: [id number] Date of Birth/Sex: Treating RN: 19-Apr-1946 (76 y.o. Jocelyn Camacho Primary Care Jocelyn Camacho: Jocelyn Camacho Other Clinician: Referring Jocelyn Camacho: Treating Jocelyn Camacho/Extender: Jocelyn Camacho in Treatment: 2 Active Problems Location of Pain Severity and Description of Pain Patient Has Paino No Site Locations Pain Management and Medication Current Pain Management: Electronic Signature(s) Signed: 04/22/2022 6:08:29 PM By: Jocelyn Hurst RN, BSN Signed: 06/03/2022 8:46:54 AM By: Jocelyn Camacho Entered By: Jocelyn Camacho on 04/22/2022 10:50:08 -------------------------------------------------------------------------------- Patient/Caregiver Education Details Patient Name: Date of Service: Jocelyn Camacho 5/2/2023andnbsp10:45 A M Medical Record Number: [id number] Patient Account Number: [id number] Date of Birth/Gender: Treating RN: [birth year] (76 y.o. Jocelyn Camacho Primary Care Physician: Jocelyn Camacho Other Clinician: Referring Physician: Treating Physician/Extender: Jocelyn Camacho in Treatment: 2 Education Assessment Education Provided To: Patient Education Topics Provided Wound/Skin Impairment: Methods: Explain/Verbal Responses: State content correctly Electronic Signature(s) Signed: 04/22/2022 6:08:29 PM By: Jocelyn Hurst RN, BSN Entered By: Jocelyn Camacho on 04/22/2022 16:56:34 -------------------------------------------------------------------------------- Wound Assessment Details Patient Name: Date of Service: Jocelyn Camacho.  04/22/2022 10:45 A M Medical Record Number: [id number] Patient Account Number: [id number] Date of Birth/Sex: Treating RN: Aug 16, 1946 (76 y.o. Jocelyn Camacho Primary Care Jocelyn Camacho: Jocelyn Camacho Other Clinician: Referring Leevon Camacho: Treating Jocelyn Camacho/Extender: Jocelyn Camacho Weeks in Treatment: 2 Wound Status Wound Number: 1 Primary Venous Leg Ulcer Etiology: Wound Location: Left, Lateral Lower Leg Wound Status: Open Wounding Event: Blister Comorbid Cataracts, Sleep Apnea, Hypertension, Type II Diabetes, Date Acquired: 03/22/2022 History: Gout, Osteoarthritis Weeks Of Treatment: 2 Clustered Wound: No Photos Wound Measurements Length: (cm) Width: (cm) Depth: (cm) Area: (cm) Volume: (cm) 0 % Reduction in Area: 100% 0 % Reduction in Volume: 100% 0 Epithelialization: Large (67-100%) 0 Tunneling: No 0 Undermining: No Wound Description Classification: Full Thickness Without Exposed Support Structures Wound Margin: Flat and Intact Exudate Amount: None Present Foul Odor After Cleansing: No Slough/Fibrino No Wound Bed Granulation Amount: None Present (0%) Exposed Structure Necrotic Amount: None Present (0%) Fascia Exposed: No Fat Layer (Subcutaneous Tissue) Exposed: No

## 2022-06-30 ENCOUNTER — Encounter (INDEPENDENT_AMBULATORY_CARE_PROVIDER_SITE_OTHER): Payer: Medicare Other | Admitting: Ophthalmology

## 2022-06-30 DIAGNOSIS — H34812 Central retinal vein occlusion, left eye, with macular edema: Secondary | ICD-10-CM

## 2022-06-30 DIAGNOSIS — I10 Essential (primary) hypertension: Secondary | ICD-10-CM | POA: Diagnosis not present

## 2022-06-30 DIAGNOSIS — H43813 Vitreous degeneration, bilateral: Secondary | ICD-10-CM | POA: Diagnosis not present

## 2022-06-30 DIAGNOSIS — H35033 Hypertensive retinopathy, bilateral: Secondary | ICD-10-CM | POA: Diagnosis not present

## 2022-07-23 ENCOUNTER — Other Ambulatory Visit: Payer: Self-pay

## 2022-07-23 ENCOUNTER — Inpatient Hospital Stay (HOSPITAL_BASED_OUTPATIENT_CLINIC_OR_DEPARTMENT_OTHER)
Admission: EM | Admit: 2022-07-23 | Discharge: 2022-08-01 | DRG: 329 | Disposition: A | Discharge status: Home Health | Payer: Medicare Other | Attending: Internal Medicine | Admitting: Internal Medicine

## 2022-07-23 ENCOUNTER — Emergency Department (HOSPITAL_BASED_OUTPATIENT_CLINIC_OR_DEPARTMENT_OTHER): Payer: Medicare Other

## 2022-07-23 ENCOUNTER — Encounter (HOSPITAL_BASED_OUTPATIENT_CLINIC_OR_DEPARTMENT_OTHER): Payer: Self-pay | Admitting: Emergency Medicine

## 2022-07-23 ENCOUNTER — Other Ambulatory Visit: Payer: Self-pay | Admitting: Nurse Practitioner

## 2022-07-23 ENCOUNTER — Ambulatory Visit (INDEPENDENT_AMBULATORY_CARE_PROVIDER_SITE_OTHER): Payer: Medicare Other | Admitting: Nurse Practitioner

## 2022-07-23 ENCOUNTER — Encounter: Payer: Self-pay | Admitting: Nurse Practitioner

## 2022-07-23 ENCOUNTER — Emergency Department (HOSPITAL_BASED_OUTPATIENT_CLINIC_OR_DEPARTMENT_OTHER): Payer: Medicare Other | Admitting: Radiology

## 2022-07-23 ENCOUNTER — Encounter (HOSPITAL_COMMUNITY): Payer: Self-pay

## 2022-07-23 VITALS — BP 104/70 | HR 112 | Temp 96.6°F | Ht 62.0 in | Wt 229.0 lb

## 2022-07-23 DIAGNOSIS — N1832 Chronic kidney disease, stage 3b: Secondary | ICD-10-CM | POA: Diagnosis present

## 2022-07-23 DIAGNOSIS — Z794 Long term (current) use of insulin: Secondary | ICD-10-CM

## 2022-07-23 DIAGNOSIS — E66813 Obesity, class 3: Secondary | ICD-10-CM | POA: Diagnosis present

## 2022-07-23 DIAGNOSIS — Z7982 Long term (current) use of aspirin: Secondary | ICD-10-CM

## 2022-07-23 DIAGNOSIS — R1084 Generalized abdominal pain: Secondary | ICD-10-CM | POA: Diagnosis not present

## 2022-07-23 DIAGNOSIS — I83892 Varicose veins of left lower extremities with other complications: Secondary | ICD-10-CM

## 2022-07-23 DIAGNOSIS — Z833 Family history of diabetes mellitus: Secondary | ICD-10-CM

## 2022-07-23 DIAGNOSIS — E11319 Type 2 diabetes mellitus with unspecified diabetic retinopathy without macular edema: Secondary | ICD-10-CM

## 2022-07-23 DIAGNOSIS — L97929 Non-pressure chronic ulcer of unspecified part of left lower leg with unspecified severity: Secondary | ICD-10-CM

## 2022-07-23 DIAGNOSIS — E1122 Type 2 diabetes mellitus with diabetic chronic kidney disease: Secondary | ICD-10-CM

## 2022-07-23 DIAGNOSIS — E785 Hyperlipidemia, unspecified: Secondary | ICD-10-CM | POA: Diagnosis present

## 2022-07-23 DIAGNOSIS — D62 Acute posthemorrhagic anemia: Secondary | ICD-10-CM | POA: Diagnosis not present

## 2022-07-23 DIAGNOSIS — Z803 Family history of malignant neoplasm of breast: Secondary | ICD-10-CM

## 2022-07-23 DIAGNOSIS — R600 Localized edema: Secondary | ICD-10-CM

## 2022-07-23 DIAGNOSIS — I5032 Chronic diastolic (congestive) heart failure: Secondary | ICD-10-CM | POA: Diagnosis present

## 2022-07-23 DIAGNOSIS — E1169 Type 2 diabetes mellitus with other specified complication: Secondary | ICD-10-CM

## 2022-07-23 DIAGNOSIS — Z79899 Other long term (current) drug therapy: Secondary | ICD-10-CM

## 2022-07-23 DIAGNOSIS — Z1389 Encounter for screening for other disorder: Secondary | ICD-10-CM

## 2022-07-23 DIAGNOSIS — G4733 Obstructive sleep apnea (adult) (pediatric): Secondary | ICD-10-CM | POA: Diagnosis present

## 2022-07-23 DIAGNOSIS — K420 Umbilical hernia with obstruction, without gangrene: Principal | ICD-10-CM

## 2022-07-23 DIAGNOSIS — I83029 Varicose veins of left lower extremity with ulcer of unspecified site: Secondary | ICD-10-CM | POA: Diagnosis not present

## 2022-07-23 DIAGNOSIS — N179 Acute kidney failure, unspecified: Secondary | ICD-10-CM | POA: Diagnosis present

## 2022-07-23 DIAGNOSIS — E039 Hypothyroidism, unspecified: Secondary | ICD-10-CM

## 2022-07-23 DIAGNOSIS — Z8249 Family history of ischemic heart disease and other diseases of the circulatory system: Secondary | ICD-10-CM

## 2022-07-23 DIAGNOSIS — I10 Essential (primary) hypertension: Secondary | ICD-10-CM | POA: Diagnosis present

## 2022-07-23 DIAGNOSIS — Z7984 Long term (current) use of oral hypoglycemic drugs: Secondary | ICD-10-CM

## 2022-07-23 DIAGNOSIS — M109 Gout, unspecified: Secondary | ICD-10-CM | POA: Diagnosis present

## 2022-07-23 DIAGNOSIS — H409 Unspecified glaucoma: Secondary | ICD-10-CM | POA: Diagnosis present

## 2022-07-23 DIAGNOSIS — Z6841 Body Mass Index (BMI) 40.0 and over, adult: Secondary | ICD-10-CM

## 2022-07-23 DIAGNOSIS — Z79890 Hormone replacement therapy (postmenopausal): Secondary | ICD-10-CM

## 2022-07-23 DIAGNOSIS — R112 Nausea with vomiting, unspecified: Secondary | ICD-10-CM

## 2022-07-23 DIAGNOSIS — D638 Anemia in other chronic diseases classified elsewhere: Secondary | ICD-10-CM

## 2022-07-23 DIAGNOSIS — K56609 Unspecified intestinal obstruction, unspecified as to partial versus complete obstruction: Secondary | ICD-10-CM

## 2022-07-23 DIAGNOSIS — K430 Incisional hernia with obstruction, without gangrene: Secondary | ICD-10-CM | POA: Diagnosis not present

## 2022-07-23 DIAGNOSIS — R5383 Other fatigue: Secondary | ICD-10-CM | POA: Diagnosis not present

## 2022-07-23 DIAGNOSIS — R5381 Other malaise: Secondary | ICD-10-CM | POA: Diagnosis present

## 2022-07-23 DIAGNOSIS — I130 Hypertensive heart and chronic kidney disease with heart failure and stage 1 through stage 4 chronic kidney disease, or unspecified chronic kidney disease: Secondary | ICD-10-CM | POA: Diagnosis present

## 2022-07-23 DIAGNOSIS — K55029 Acute infarction of small intestine, extent unspecified: Secondary | ICD-10-CM | POA: Diagnosis present

## 2022-07-23 DIAGNOSIS — E559 Vitamin D deficiency, unspecified: Secondary | ICD-10-CM

## 2022-07-23 DIAGNOSIS — N183 Chronic kidney disease, stage 3 unspecified: Secondary | ICD-10-CM

## 2022-07-23 DIAGNOSIS — Z9889 Other specified postprocedural states: Secondary | ICD-10-CM

## 2022-07-23 DIAGNOSIS — A419 Sepsis, unspecified organism: Principal | ICD-10-CM

## 2022-07-23 DIAGNOSIS — K432 Incisional hernia without obstruction or gangrene: Secondary | ICD-10-CM | POA: Diagnosis present

## 2022-07-23 DIAGNOSIS — I872 Venous insufficiency (chronic) (peripheral): Secondary | ICD-10-CM

## 2022-07-23 DIAGNOSIS — K559 Vascular disorder of intestine, unspecified: Secondary | ICD-10-CM | POA: Diagnosis not present

## 2022-07-23 LAB — CBC WITH DIFFERENTIAL/PLATELET
Abs Immature Granulocytes: 0.24 10*3/uL — ABNORMAL HIGH (ref 0.00–0.07)
Basophils Absolute: 0 10*3/uL (ref 0.0–0.1)
Basophils Relative: 0 %
Eosinophils Absolute: 0 10*3/uL (ref 0.0–0.5)
Eosinophils Relative: 0 %
HCT: 42.1 % (ref 36.0–46.0)
Hemoglobin: 13.8 g/dL (ref 12.0–15.0)
Immature Granulocytes: 1 %
Lymphocytes Relative: 7 %
Lymphs Abs: 1.9 10*3/uL (ref 0.7–4.0)
MCH: 28.5 pg (ref 26.0–34.0)
MCHC: 32.8 g/dL (ref 30.0–36.0)
MCV: 86.8 fL (ref 80.0–100.0)
Monocytes Absolute: 2 10*3/uL — ABNORMAL HIGH (ref 0.1–1.0)
Monocytes Relative: 7 %
Neutro Abs: 24.3 10*3/uL — ABNORMAL HIGH (ref 1.7–7.7)
Neutrophils Relative %: 85 %
Platelet Morphology: NORMAL
Platelets: 488 10*3/uL — ABNORMAL HIGH (ref 150–400)
RBC: 4.85 MIL/uL (ref 3.87–5.11)
RDW: 14.2 % (ref 11.5–15.5)
Smear Review: ADEQUATE
WBC: 28.5 10*3/uL — ABNORMAL HIGH (ref 4.0–10.5)
nRBC: 0 % (ref 0.0–0.2)

## 2022-07-23 LAB — COMPREHENSIVE METABOLIC PANEL
ALT: 19 U/L (ref 0–44)
AST: 28 U/L (ref 15–41)
Albumin: 4.5 g/dL (ref 3.5–5.0)
Alkaline Phosphatase: 95 U/L (ref 38–126)
Anion gap: 20 — ABNORMAL HIGH (ref 5–15)
BUN: 62 mg/dL — ABNORMAL HIGH (ref 8–23)
CO2: 24 mmol/L (ref 22–32)
Calcium: 10.4 mg/dL — ABNORMAL HIGH (ref 8.9–10.3)
Chloride: 95 mmol/L — ABNORMAL LOW (ref 98–111)
Creatinine, Ser: 3.76 mg/dL — ABNORMAL HIGH (ref 0.44–1.00)
GFR, Estimated: 12 mL/min — ABNORMAL LOW (ref >60)
Glucose, Bld: 210 mg/dL — ABNORMAL HIGH (ref 70–99)
Potassium: 4.2 mmol/L (ref 3.5–5.1)
Sodium: 139 mmol/L (ref 135–145)
Total Bilirubin: 1.2 mg/dL (ref 0.3–1.2)
Total Protein: 8.2 g/dL — ABNORMAL HIGH (ref 6.5–8.1)

## 2022-07-23 LAB — TROPONIN I (HIGH SENSITIVITY)
Troponin I (High Sensitivity): 23 ng/L — ABNORMAL HIGH (ref <18)
Troponin I (High Sensitivity): 25 ng/L — ABNORMAL HIGH (ref <18)

## 2022-07-23 LAB — CBG MONITORING, ED: Glucose-Capillary: 209 mg/dL — ABNORMAL HIGH (ref 70–99)

## 2022-07-23 LAB — PROTIME-INR
INR: 1.1 (ref 0.8–1.2)
Prothrombin Time: 14.2 seconds (ref 11.4–15.2)

## 2022-07-23 LAB — LACTIC ACID, PLASMA
Lactic Acid, Venous: 3.3 mmol/L (ref 0.5–1.9)
Lactic Acid, Venous: 2.3 mmol/L (ref 0.5–1.9)

## 2022-07-23 LAB — APTT: aPTT: 25 seconds (ref 24–36)

## 2022-07-23 MED ORDER — LACTATED RINGERS IV SOLN: INTRAVENOUS | Status: DC

## 2022-07-23 MED ORDER — LACTATED RINGERS IV BOLUS (SEPSIS): 500.0000 mL | Freq: Once | INTRAVENOUS | Status: DC

## 2022-07-23 MED ORDER — METRONIDAZOLE 500 MG/100ML IV SOLN
500.0000 mg | Freq: Once | INTRAVENOUS | Status: AC
  Administered 2022-07-23: 500 mg via INTRAVENOUS
  Filled 2022-07-23: qty 100

## 2022-07-23 MED ORDER — SODIUM CHLORIDE 0.9 % IV SOLN: 1.0000 g | INTRAVENOUS | Status: DC

## 2022-07-23 MED ORDER — LACTATED RINGERS IV BOLUS (SEPSIS)
1000.0000 mL | Freq: Once | INTRAVENOUS | Status: AC
Start: 2022-07-23 — End: 2022-07-23
  Administered 2022-07-23: 1000 mL via INTRAVENOUS

## 2022-07-23 MED ORDER — LACTATED RINGERS IV BOLUS (SEPSIS)
1000.0000 mL | Freq: Once | INTRAVENOUS | Status: AC
  Administered 2022-07-23: 1000 mL via INTRAVENOUS

## 2022-07-23 MED ORDER — DOXYCYCLINE HYCLATE 100 MG PO CAPS: 100.0000 mg | ORAL_CAPSULE | Freq: Two times a day (BID) | ORAL | 1 refills | Status: DC

## 2022-07-23 MED ORDER — LIDOCAINE HCL (PF) 1 % IJ SOLN
INTRAMUSCULAR | Status: AC
  Filled 2022-07-23: qty 5

## 2022-07-23 MED ORDER — ONDANSETRON 4 MG PO TBDP: 4.0000 mg | ORAL_TABLET | Freq: Three times a day (TID) | ORAL | 0 refills | Status: DC | PRN

## 2022-07-23 MED ORDER — LIDOCAINE VISCOUS HCL 2 % MT SOLN
15.0000 mL | Freq: Once | OROMUCOSAL | Status: AC
  Administered 2022-07-24: 15 mL via OROMUCOSAL
  Filled 2022-07-23: qty 15

## 2022-07-23 MED ORDER — SODIUM CHLORIDE 0.9 % IV SOLN
2.0000 g | Freq: Once | INTRAVENOUS | Status: AC
  Administered 2022-07-23: 2 g via INTRAVENOUS
  Filled 2022-07-23: qty 12.5

## 2022-07-23 MED ORDER — FENTANYL CITRATE PF 50 MCG/ML IJ SOSY
50.0000 ug | PREFILLED_SYRINGE | Freq: Once | INTRAMUSCULAR | Status: AC
  Administered 2022-07-23: 50 ug via INTRAVENOUS
  Filled 2022-07-23: qty 1

## 2022-07-23 NOTE — ED Triage Notes (Signed)
Started Sunday, n/v and abdominal pain. Took tylenol at home earlier today, no improvement. Was seen by PCP today.

## 2022-07-23 NOTE — ED Notes (Signed)
Pt's clothing given to pt's daughter.

## 2022-07-23 NOTE — ED Notes (Signed)
Dr. Tomi Bamberger made aware of Lactic 3.1.

## 2022-07-23 NOTE — ED Notes (Addendum)
Pt Sinus on the monitor.

## 2022-07-23 NOTE — Progress Notes (Signed)
Assessment and Plan:  Jocelyn Camacho was seen today for a follow up.  1. Nausea and vomiting, unspecified vomiting type Start Zofran 4 mg. Discussed oral rehydration solution, Gatorade, water. Avoid drinking fluids that contain a lot of sugar, caffeine. Eat a bland diet. Continue to monitor   2. Generalized abdominal pain May use heating pad to abdomen. Monitor bowel changes. Notify office if changes for any blood is seen in stool. Rest.  3. Other fatigue Continue to rest.   4. Venous stasis ulcer of left lower leg with edema of left lower leg (HCC) Start Doxycycline. Refer back to Wound clinic. Will have nurse reach out to help schedule apt Continue compression stockings. Keep clean. Keep BG well controlled.  5. Type 2 diabetes mellitus with stage 3b chronic kidney disease, with long-term current use of insulin ([facility]) Continue to keep BG well controlled. Discussed how what you eat and drink can aide in kidney protection. Stay well hydrated. Avoid high salt foods. Avoid NSAIDS. Keep BP and BG well controlled.   Take medications as prescribed. Remain active and exercise as tolerated daily. Maintain weight.   Meds ordered this encounter  Medications   ondansetron (ZOFRAN-ODT) 4 MG disintegrating tablet    Sig: Take 1 tablet (4 mg total) by mouth every 8 (eight) hours as needed for nausea or vomiting.    Dispense:  20 tablet    Refill:  0    Order Specific Question:   Supervising Provider    Answer:   [name] [[id number]]   doxycycline (VIBRAMYCIN) 100 MG capsule    Sig: Take 1 capsule (100 mg total) by mouth 2 (two) times daily.    Dispense:  14 capsule    Refill:  1    Order Specific Question:   Supervising Provider    Answer:   [name] [phone]   Orders Placed This Encounter  Procedures   CBC with Differential/Platelet   COMPLETE METABOLIC PANEL WITH GFR   Lipid panel   TSH   Hemoglobin A1c   VITAMIN D 25 Hydroxy (Vit-D Deficiency, Fractures)    Urinalysis, Routine w reflex microscopic    Continue to monitor for any increase in fever, chills, N/V, diarrhea, changes to bowel habits, blood in stool.  Notify office for further evaluation and treatment, questions or concerns if s/s fail to improve. The risks and benefits of my recommendations, as well as other treatment options were discussed with the patient today. Questions were answered.  Further disposition pending results of labs. Discussed med's effects and SE's.    Over 20 minutes of exam, counseling, chart review, and critical decision making was performed.   Future Appointments  Date Time Provider [place]  08/11/2022  8:15 AM [name], MD [facility] None  08/12/2022  9:00 AM Darrol Jump, NP [facility] None  10/30/2022  9:30 AM Debbora Presto, NP [facility] None  03/05/2023 10:00 AM Alycia Rossetti, NP [facility] None    ------------------------------------------------------------------------------------------------------------------   HPI BP 104/70   Pulse (!) 112   Temp (!) 96.6 F (35.9 C)   Ht $R'5\' 2"'Lr$  (1.575 m)   Wt 229 lb (103.9 kg)   SpO2 94%   BMI 41.88 kg/m   76 y.o.female presents for evaluation of vomiting. On Sunday, ate chicken, peas, salad.  Lunch time, she vomitted that night, still vomiting 1-2 times a day.  Stomach pain, no diarrhea,  no fever, no blood in the vomit.  States that when she lays down it occurs more.  She feels more fatigued and weak.  A few years ago she had foods poisoning.  She has not been eating or drinking, she is able to hold down broth and noodles with gigerale.  Husband ate same meal and had some diarrhea but no vomintting.  Her sugars have been elevated around 212.  She does take Trulicity but does not feel the occurrence is associated.  She also complains of new venous stasis ulcer on LLE.  She has followed with wound clinic in the past.  She reports trying to schedule apt but has been unsuccessful.      Past Medical  History:  Diagnosis Date   Allergic rhinitis    Allergy    Anxiety    with medical procedures   Hyperlipidemia    Hypertension    Obesity    OSA (obstructive sleep apnea)    wears CPAP   Type II or unspecified type diabetes mellitus with renal manifestations, not stated as uncontrolled(250.40)    Unspecified hypothyroidism    Vitamin D deficiency      Allergies  Allergen Reactions   Penicillins Swelling   Sulfa Antibiotics Itching and Rash    Current Outpatient Medications on File Prior to Visit  Medication Sig   allopurinol (ZYLOPRIM) 300 MG tablet TAKE 1/2 TABLET(150 MG) BY MOUTH DAILY FOR GOUT PREVENTION   Ascorbic Acid (VITAMIN C) 1000 MG tablet Take 1,000 mg by mouth daily.   aspirin EC 81 MG tablet Take 81 mg by mouth daily.   atenolol (TENORMIN) 100 MG tablet Take 1 tablet by mouth once daily for blood pressure   BLACK PEPPER-TURMERIC PO Take by mouth daily.   Blood Glucose Monitoring Suppl (ONE TOUCH ULTRA 2) w/Device KIT check blood sugar 3 times daily-DX-E11.21   Cholecalciferol (VITAMIN D3) 5000 units CAPS Take 10,000 Units by mouth daily. Taking 5,000 units   colchicine 0.6 MG tablet Take  1 tablet  Daily  to Prevent Gout                                                                 /                         TAKE                     BY                     MOUTH   Cyanocobalamin (B-12) 1000 MCG SUBL Place 1 tablet under the tongue 2 (two) times a week.   dorzolamide-timolol (COSOPT) 22.3-6.8 MG/ML ophthalmic solution Place 1 drop into the left eye 2 (two) times daily.   Dulaglutide (TRULICITY) 3 FF/6.3WG SOPN Inject  3 MG(0.5 ML) into the skin  weekly for Diabetes (Dx: e10.29)   Finerenone (KERENDIA) 10 MG TABS Take 10 mg by mouth daily. Take  1 tablet  Daily  for Diabetic Kidneys   furosemide (LASIX) 80 MG tablet TAKE 1 TABLET BY MOUTH 1 TO 2 TIMES PER DAY AS NEEDED FOR FLUID RETENTION OR ANKLE SWELLING   hyoscyamine (LEVSIN, ANASPAZ) 0.125 MG tablet Take 1 tablet  (0.125 mg total) by mouth every 6 (six) hours as needed.   insulin  NPH-regular Human (70-30) 100 UNIT/ML injection Patient injects 25 units in the AM and 10 units in the PM. Reduce each insulin dose by 2 units if glucose persistently <100   Lancets (ONETOUCH ULTRASOFT) lancets Check blood sugar 3 times daily-DX-E10.9   levothyroxine (SYNTHROID) 125 MCG tablet Take  1 tablet  Daily  on an empty stomach with only water for 30 minutes & no Antacid meds, Calcium or Magnesium for 4 hours & avoid Biotin   losartan (COZAAR) 100 MG tablet Take 1 tablet (100 mg total) by mouth daily.   ONETOUCH ULTRA test strip CHECK BLOOD SUGAR 3 TIMES A DAY   polymyxin B 500,000 Units in sodium chloride irrigation 0.9 % 500 mL Irrigate with 1 application as directed every 30 (thirty) days. When getting her eye inj.   rosuvastatin (CRESTOR) 5 MG tablet Take 1 tablet once weekly as directed for Cholesterol LDL goal less than 70.   zinc gluconate 50 MG tablet Take 50 mg by mouth daily.   doxycycline (VIBRAMYCIN) 100 MG capsule Take 1 capsule (100 mg total) by mouth 2 (two) times daily. (Patient not taking: Reported on 05/02/2022)   No current facility-administered medications on file prior to visit.    ROS: all negative except what is noted in the HPI.    Physical Exam:  BP 104/70   Pulse (!) 112   Temp (!) 96.6 F (35.9 C)   Ht $R'5\' 2"'gu$  (1.575 m)   Wt 229 lb (103.9 kg)   SpO2 94%   BMI 41.88 kg/m   General Appearance: NAD.  Awake, conversant and cooperative. Eyes: PERRLA, EOMs intact.  Sclera white.  Conjunctiva without erythema. Sinuses: No frontal/maxillary tenderness.  No nasal discharge. Nares patent.  ENT/Mouth: Ext aud canals clear.  Bilateral TMs w/DOL and without erythema or bulging. Hearing intact.  Posterior pharynx without swelling or exudate.  Tonsils without swelling or erythema.  Neck: Supple.  No masses, nodules or thyromegaly. Respiratory: Effort is regular with non-labored breathing. Breath  sounds are equal bilaterally without rales, rhonchi, wheezing or stridor.  Cardio: RRR with no MRGs. Brisk peripheral pulses without edema.  Abdomen: Active BS in all four quadrants.  Soft and non-tender without guarding, rebound tenderness, hernias or masses. Lymphatics: Non tender without lymphadenopathy.  Musculoskeletal: Full ROM, 5/5 strength, normal ambulation.  No clubbing or cyanosis. Skin: LLE with approximately 3 cm round raised clear ulceration.  Intact.  Clear Tegaderm applied.  Surrounding skin intact.  Appropriate color for ethnicity. Warm. Neuro: CN II-XII grossly normal. Normal muscle tone without cerebellar symptoms and intact sensation.   Psych: AO X 3,  appropriate mood and affect, insight and judgment.     Darrol Jump, NP 10:26 AM [facility]

## 2022-07-23 NOTE — Progress Notes (Signed)
Pharmacy Antibiotic Note  Jocelyn Camacho is a 76 y.o. female admitted on 07/23/2022 presenting with N/V and abdominal pain, concern for IAI.  Pharmacy has been consulted for cefepime dosing.  Flagyl per MD  Plan: Cefepime 2g IV x 1, then 1g IV q 24h Monitor renal function, Cx and clinical progression to narrow     Temp (24hrs), Avg:97.2 F (36.2 C), Min:96.6 F (35.9 C), Max:97.7 F (36.5 C)  Recent Labs  Lab 07/23/22 1917  WBC 28.5*  CREATININE 3.76*  LATICACIDVEN 3.3*    Estimated Creatinine Clearance: 14.4 mL/min (A) (by C-G formula based on SCr of 3.76 mg/dL (H)).    Allergies  Allergen Reactions   Penicillins Swelling   Sulfa Antibiotics Itching and Rash    Bertis Ruddy, PharmD Clinical Pharmacist ED Pharmacist Phone # [phone] 07/23/2022 8:28 PM

## 2022-07-23 NOTE — ED Notes (Signed)
Dr. Tomi Bamberger at bedside attempting IV u/s to Left [facility] without success times one. Pt tolerated well.

## 2022-07-23 NOTE — Sepsis Progress Note (Signed)
Monitoring for the code sepsis protocol. °

## 2022-07-23 NOTE — Patient Instructions (Signed)
Vomiting, Adult Vomiting is when stomach contents forcefully come out of the mouth. Many people notice nausea before vomiting. Vomiting can make you feel weak and cause you to become dehydrated. Dehydration can make you feel tired and thirsty, cause you to have a dry mouth, and decrease how often you urinate. Older adults and people who have other diseases or a weak body defense system (immune system) are at higher risk for dehydration. It is important to treat vomiting as told by your health care provider. Follow these instructions at home:  Watch your symptoms for any changes. Tell your health care provider about them. Eating and drinking     Follow these recommendations as told by your health care provider: Take an oral rehydration solution (ORS). This is a drink that is sold at pharmacies and retail stores. Eat bland, easy-to-digest foods in small amounts as you are able. These foods include bananas, applesauce, rice, lean meats, toast, and crackers. Drink clear fluids slowly and in small amounts as you are able. Clear fluids include water, ice chips, low-calorie sports drinks, and fruit juice that has water added (diluted fruit juice). Avoid drinking fluids that contain a lot of sugar or caffeine, such as energy drinks, sports drinks, and soda. Avoid alcohol. Avoid spicy or fatty foods.  General instructions Wash your hands often using soap and water for at least 20 seconds. If soap and water are not available, use hand sanitizer. Make sure that everyone in your household washes their hands frequently. Take over-the-counter and prescription medicines only as told by your health care provider. Rest at home while you recover. Watch your condition for any changes. Keep all follow-up visits. This is important. Contact a health care provider if: Your vomiting gets worse. You have new symptoms. You have a fever. You cannot drink fluids without vomiting. You feel light-headed or  dizzy. You have a headache. You have muscle cramps. You have a rash. You have pain while urinating. Get help right away if: You have pain in your chest, neck, arm, or jaw. Your heart is beating very quickly. You have trouble breathing or you are breathing very quickly. You feel extremely weak or you faint. Your skin feels cold and clammy. You feel confused. You have persistent vomiting. You have vomit that is bright red or looks like black coffee grounds. You have stools (feces) that are bloody or black, or stools that look like tar. You have a severe headache, a stiff neck, or both. You have severe pain, cramping, or bloating in your abdomen. You have signs of dehydration, such as: Dark urine, very little urine, or no urine. Cracked lips. Dry mouth. Sunken eyes. Sleepiness. Weakness. These symptoms may be an emergency. Get help right away. Call 911. Do not wait to see if the symptoms will go away. Do not drive yourself to the hospital. Summary Vomiting is when stomach contents forcefully come out of the mouth. Vomiting can cause you to become dehydrated. It is important to treat vomiting as told by your health care provider. Follow your health care provider's instructions about eating and drinking. Wash your hands often using soap and water for at least 20 seconds. If soap and water are not available, use hand sanitizer. Watch your condition for any changes and for signs of dehydration. Keep all follow-up visits. This is important. This information is not intended to replace advice given to you by your health care provider. Make sure you discuss any questions you have with your health care provider.   Document Revised: 06/14/2021 Document Reviewed: 06/14/2021 [employer] Patient Education  2023 [employer]  

## 2022-07-23 NOTE — ED Provider Notes (Signed)
[place] EMERGENCY DEPT Provider Note   CSN: [id number] Arrival date & time: 07/23/22  1817     History {Add pertinent medical, surgical, social history, OB history to HPI:1} Chief Complaint  Patient presents with  . Emesis    Jocelyn Camacho is a 76 y.o. female.   Emesis  Patient has history of hypertension hyperlipidemia, diabetes, obstructive sleep apnea who presents with complaints of vomiting nausea and abdominal pain.  Patient states she started having symptoms over the weekend.  She has had multiple episodes of nausea and vomiting and has not been able to keep anything down.  She denies any significant diarrhea or dysuria.  She has had generalized abdominal pain.  Patient has been feeling weak.  She went to her doctor's office today and then was sent to the ED for further evaluation.    Home Medications Prior to Admission medications   Medication Sig Start Date End Date Taking? Authorizing Provider  allopurinol (ZYLOPRIM) 300 MG tablet TAKE 1/2 TABLET(150 MG) BY MOUTH DAILY FOR GOUT PREVENTION 01/22/22   Alycia Rossetti, NP  Ascorbic Acid (VITAMIN C) 1000 MG tablet Take 1,000 mg by mouth daily.    [provider]  aspirin EC 81 MG tablet Take 81 mg by mouth daily.    [provider]  atenolol (TENORMIN) 100 MG tablet Take 1 tablet by mouth once daily for blood pressure 02/10/22   Alycia Rossetti, NP  BLACK PEPPER-TURMERIC PO Take by mouth daily.    [provider]  Blood Glucose Monitoring Suppl (ONE TOUCH ULTRA 2) w/Device KIT check blood sugar 3 times daily-DX-E11.21 08/14/20   [name], MD  Cholecalciferol (VITAMIN D3) 5000 units CAPS Take 10,000 Units by mouth daily. Taking 5,000 units    [provider]  colchicine 0.6 MG tablet Take  1 tablet  Daily  to Prevent Gout                                                                 /                         TAKE                     BY                     MOUTH  05/18/22   [name], MD  Cyanocobalamin (B-12) 1000 MCG SUBL Place 1 tablet under the tongue 2 (two) times a week.    [provider]  dorzolamide-timolol (COSOPT) 22.3-6.8 MG/ML ophthalmic solution Place 1 drop into the left eye 2 (two) times daily. 05/03/21   [provider]  doxycycline (VIBRAMYCIN) 100 MG capsule Take 1 capsule (100 mg total) by mouth 2 (two) times daily. 07/23/22   Darrol Jump, NP  Dulaglutide (TRULICITY) 3 PN/3.6RW SOPN Inject  3 MG(0.5 ML) into the skin  weekly for Diabetes (Dx: e10.29) 02/11/22   [name], MD  Finerenone (KERENDIA) 10 MG TABS Take 10 mg by mouth daily. Take  1 tablet  Daily  for Diabetic Kidneys 04/11/21   [name], MD  furosemide (LASIX) 80 MG tablet TAKE 1 TABLET BY MOUTH 1 TO 2  TIMES PER DAY AS NEEDED FOR FLUID RETENTION OR ANKLE SWELLING 10/15/21   Alycia Rossetti, NP  hyoscyamine (LEVSIN, ANASPAZ) 0.125 MG tablet Take 1 tablet (0.125 mg total) by mouth every 6 (six) hours as needed. 10/05/17   [name], MD  insulin NPH-regular Human (70-30) 100 UNIT/ML injection Patient injects 25 units in the AM and 10 units in the PM. Reduce each insulin dose by 2 units if glucose persistently <100 10/24/20   [name], NP  Lancets [facility] ULTRASOFT) lancets Check blood sugar 3 times daily-DX-E10.9 08/06/17   [name], MD  levothyroxine (SYNTHROID) 125 MCG tablet Take  1 tablet  Daily  on an empty stomach with only water for 30 minutes & no Antacid meds, Calcium or Magnesium for 4 hours & avoid Biotin 12/29/21   [name], MD  losartan (COZAAR) 100 MG tablet Take 1 tablet (100 mg total) by mouth daily. 04/07/22   Alycia Rossetti, NP  ondansetron (ZOFRAN-ODT) 4 MG disintegrating tablet Take 1 tablet (4 mg total) by mouth every 8 (eight) hours as needed for nausea or vomiting. 07/23/22   Darrol Jump, NP  ONETOUCH ULTRA test strip CHECK BLOOD SUGAR 3 TIMES A DAY 05/18/22   [name], MD  polymyxin  B 500,000 Units in sodium chloride irrigation 0.9 % 500 mL Irrigate with 1 application as directed every 30 (thirty) days. When getting her eye inj.    [provider]  rosuvastatin (CRESTOR) 5 MG tablet Take 1 tablet once weekly as directed for Cholesterol LDL goal less than 70. 08/12/21   [name], NP  zinc gluconate 50 MG tablet Take 50 mg by mouth daily.    [provider]      Allergies    Penicillins and Sulfa antibiotics    Review of Systems   Review of Systems  Gastrointestinal:  Positive for vomiting.    Physical Exam Updated Vital Signs BP (!) 83/57   Pulse 92   Temp 97.7 F (36.5 C) (Oral)   Resp (!) 35   SpO2 91%  Physical Exam Vitals and nursing note reviewed.  Constitutional:      Appearance: She is well-developed. She is ill-appearing.  HENT:     Head: Normocephalic and atraumatic.     Right Ear: External ear normal.     Left Ear: External ear normal.  Eyes:     General: No scleral icterus.       Right eye: No discharge.        Left eye: No discharge.     Conjunctiva/sclera: Conjunctivae normal.  Neck:     Trachea: No tracheal deviation.  Cardiovascular:     Rate and Rhythm: Normal rate and regular rhythm.  Pulmonary:     Effort: Pulmonary effort is normal. No respiratory distress.     Breath sounds: Normal breath sounds. No stridor. No wheezing or rales.  Abdominal:     General: Bowel sounds are normal. There is no distension.     Palpations: Abdomen is soft.     Tenderness: There is abdominal tenderness. There is no guarding or rebound.  Musculoskeletal:        General: No tenderness or deformity.     Cervical back: Neck supple.  Skin:    General: Skin is warm and dry.     Findings: No rash.  Neurological:     General: No focal deficit present.     Mental Status: She is alert.     Cranial Nerves: No  cranial nerve deficit (no facial droop, extraocular movements intact, no slurred speech).     Sensory: No sensory deficit.      Motor: No abnormal muscle tone or seizure activity.     Coordination: Coordination normal.  Psychiatric:        Mood and Affect: Mood normal.     ED Results / Procedures / Treatments   Labs (all labs ordered are listed, but only abnormal results are displayed) Labs Reviewed  COMPREHENSIVE METABOLIC PANEL - Abnormal; Notable for the following components:      Result Value   Chloride 95 (*)    Glucose, Bld 210 (*)    BUN 62 (*)    Creatinine, Ser 3.76 (*)    Calcium 10.4 (*)    Total Protein 8.2 (*)    GFR, Estimated 12 (*)    Anion gap 20 (*)    All other components within normal limits  LACTIC ACID, PLASMA - Abnormal; Notable for the following components:   Lactic Acid, Venous 3.3 (*)    All other components within normal limits  CBC WITH DIFFERENTIAL/PLATELET - Abnormal; Notable for the following components:   WBC 28.5 (*)    Platelets 488 (*)    Neutro Abs 24.3 (*)    Monocytes Absolute 2.0 (*)    Abs Immature Granulocytes 0.24 (*)    All other components within normal limits  CBG MONITORING, ED - Abnormal; Notable for the following components:   Glucose-Capillary 209 (*)    All other components within normal limits  CULTURE, BLOOD (ROUTINE X 2)  CULTURE, BLOOD (ROUTINE X 2)  URINE CULTURE  PROTIME-INR  LACTIC ACID, PLASMA  URINALYSIS, ROUTINE W REFLEX MICROSCOPIC  APTT  TROPONIN I (HIGH SENSITIVITY)    EKG None  Radiology DG Chest Port 1 View  Result Date: 07/23/2022 CLINICAL DATA:  Nausea and vomiting, initial encounter EXAM: PORTABLE CHEST 1 VIEW COMPARISON:  10/09/2008 FINDINGS: Cardiac shadow is stable. Bibasilar atelectatic changes are noted left greater than right. No sizable effusion is noted. No acute bony abnormality is seen. IMPRESSION: Bibasilar atelectasis left greater than right. Electronically Signed   By: Inez Catalina M.D.   On: 07/23/2022 19:46    Procedures .Central Line  Date/Time: 07/23/2022 9:10 PM  Performed by: Dorie Rank,  MD Authorized by: Dorie Rank, MD   Consent:    Consent obtained:  Verbal   Consent given by:  Patient   Risks, benefits, and alternatives were discussed: yes     Alternatives discussed:  No treatment Universal protocol:    Procedure explained and questions answered to patient or proxy's satisfaction: yes   Pre-procedure details:    Indication(s): insufficient peripheral access     Hand hygiene: Hand hygiene performed prior to insertion     Sterile barrier technique: All elements of maximal sterile technique followed     Skin preparation:  Povidone-iodine   Skin preparation agent: Skin preparation agent completely dried prior to procedure   Sedation:    Sedation type:  None Anesthesia:    Anesthesia method:  Local infiltration   Local anesthetic:  Lidocaine 1% w/o epi Procedure details:    Location:  R femoral   Procedural supplies:  Triple lumen   Catheter size:  7 Fr   Landmarks identified: yes     Ultrasound guidance: yes     Ultrasound guidance timing: real time     Sterile ultrasound techniques: Sterile gel and sterile probe covers were used     Number  of attempts:  1   Successful placement: yes   Post-procedure details:    Post-procedure:  Dressing applied and line sutured   Assessment:  Blood return through all ports and free fluid flow   Procedure completion:  Tolerated well, no immediate complications   {Document cardiac monitor, telemetry assessment procedure when appropriate:1}  Medications Ordered in ED Medications  lactated ringers infusion (has no administration in time range)  lactated ringers bolus 1,000 mL (has no administration in time range)    And  lactated ringers bolus 1,000 mL (has no administration in time range)    And  lactated ringers bolus 1,000 mL (has no administration in time range)    And  lactated ringers bolus 500 mL (has no administration in time range)  ceFEPIme (MAXIPIME) 2 g in sodium chloride 0.9 % 100 mL IVPB (has no administration  in time range)  metroNIDAZOLE (FLAGYL) IVPB 500 mg (has no administration in time range)  lidocaine (PF) (XYLOCAINE) 1 % injection (has no administration in time range)  ceFEPIme (MAXIPIME) 1 g in sodium chloride 0.9 % 100 mL IVPB (has no administration in time range)    ED Course/ Medical Decision Making/ A&P Clinical Course as of 07/23/22 2112  Wed Jul 23, 2022  2007 Comprehensive metabolic panel(!) Creatinine elevated compared to previous [JK]  2111 Lactic acid, plasma(!!) Lactic acid elevated [JK]  2111 Protime-INR INR normal [JK]  2111 CBC with Differential(!) Leukocytosis noted [JK]  2111 Comprehensive metabolic panel(!) CT shows acute kidney injury with elevated BUN and creatinine [JK]    Clinical Course User Index [JK] Dorie Rank, MD                           Medical Decision Making Amount and/or Complexity of Data Reviewed Labs: ordered. Decision-making details documented in ED Course. Radiology: ordered. ECG/medicine tests: ordered.  Risk Prescription drug management.   ***  {Document critical care time when appropriate:1} {Document review of labs and clinical decision tools ie heart score, Chads2Vasc2 etc:1}  {Document your independent review of radiology images, and any outside records:1} {Document your discussion with family members, caretakers, and with consultants:1} {Document social determinants of health affecting pt's care:1} {Document your decision making why or why not admission, treatments were needed:1} Final Clinical Impression(s) / ED Diagnoses Final diagnoses:  None    Rx / DC Orders ED Discharge Orders     None

## 2022-07-23 NOTE — ED Notes (Signed)
Returned from CT scan. Tolerated well.

## 2022-07-23 NOTE — ED Notes (Signed)
Dr. Tomi Bamberger attempted right EJ access without success times one. Pt tolerated well.

## 2022-07-23 NOTE — ED Notes (Addendum)
Dr. Tomi Bamberger at bedside placing a central line right femoral. Pt tolerated well.

## 2022-07-23 NOTE — ED Notes (Signed)
In and out cath with 31fr. Using sterile technique. Pt only had a couple drops in bladder. Dr. Tomi Bamberger aware this is not enough for a specimen.

## 2022-07-23 NOTE — ED Notes (Addendum)
Pt. Moved to resus room. IV site had infiltrated to right AC. Compression applied to site. Dr. Tomi Bamberger aware that pt needs IV with u/s due to no good peripheral options.

## 2022-07-24 ENCOUNTER — Encounter (HOSPITAL_COMMUNITY): Payer: Self-pay | Admitting: Certified Registered Nurse Anesthetist

## 2022-07-24 ENCOUNTER — Emergency Department (HOSPITAL_BASED_OUTPATIENT_CLINIC_OR_DEPARTMENT_OTHER): Payer: Medicare Other | Admitting: Radiology

## 2022-07-24 ENCOUNTER — Emergency Department (HOSPITAL_COMMUNITY): Payer: Medicare Other | Admitting: Certified Registered Nurse Anesthetist

## 2022-07-24 ENCOUNTER — Emergency Department (EMERGENCY_DEPARTMENT_HOSPITAL): Payer: Medicare Other | Admitting: Certified Registered Nurse Anesthetist

## 2022-07-24 ENCOUNTER — Encounter (HOSPITAL_COMMUNITY)
Admission: EM | Disposition: A | Discharge status: Home Health | Payer: Self-pay | Source: Home / Self Care | Attending: Internal Medicine

## 2022-07-24 DIAGNOSIS — Z79899 Other long term (current) drug therapy: Secondary | ICD-10-CM | POA: Diagnosis not present

## 2022-07-24 DIAGNOSIS — M109 Gout, unspecified: Secondary | ICD-10-CM | POA: Diagnosis present

## 2022-07-24 DIAGNOSIS — K432 Incisional hernia without obstruction or gangrene: Secondary | ICD-10-CM | POA: Diagnosis present

## 2022-07-24 DIAGNOSIS — K55029 Acute infarction of small intestine, extent unspecified: Secondary | ICD-10-CM | POA: Diagnosis present

## 2022-07-24 DIAGNOSIS — E039 Hypothyroidism, unspecified: Secondary | ICD-10-CM | POA: Diagnosis present

## 2022-07-24 DIAGNOSIS — Z7982 Long term (current) use of aspirin: Secondary | ICD-10-CM | POA: Diagnosis not present

## 2022-07-24 DIAGNOSIS — N179 Acute kidney failure, unspecified: Secondary | ICD-10-CM | POA: Diagnosis present

## 2022-07-24 DIAGNOSIS — E1169 Type 2 diabetes mellitus with other specified complication: Secondary | ICD-10-CM | POA: Diagnosis present

## 2022-07-24 DIAGNOSIS — K559 Vascular disorder of intestine, unspecified: Secondary | ICD-10-CM | POA: Diagnosis not present

## 2022-07-24 DIAGNOSIS — Z794 Long term (current) use of insulin: Secondary | ICD-10-CM | POA: Diagnosis not present

## 2022-07-24 DIAGNOSIS — I10 Essential (primary) hypertension: Secondary | ICD-10-CM | POA: Diagnosis not present

## 2022-07-24 DIAGNOSIS — G4733 Obstructive sleep apnea (adult) (pediatric): Secondary | ICD-10-CM

## 2022-07-24 DIAGNOSIS — Z8249 Family history of ischemic heart disease and other diseases of the circulatory system: Secondary | ICD-10-CM | POA: Diagnosis not present

## 2022-07-24 DIAGNOSIS — K420 Umbilical hernia with obstruction, without gangrene: Secondary | ICD-10-CM | POA: Diagnosis present

## 2022-07-24 DIAGNOSIS — I5032 Chronic diastolic (congestive) heart failure: Secondary | ICD-10-CM | POA: Diagnosis present

## 2022-07-24 DIAGNOSIS — H409 Unspecified glaucoma: Secondary | ICD-10-CM | POA: Diagnosis present

## 2022-07-24 DIAGNOSIS — Z833 Family history of diabetes mellitus: Secondary | ICD-10-CM | POA: Diagnosis not present

## 2022-07-24 DIAGNOSIS — K430 Incisional hernia with obstruction, without gangrene: Secondary | ICD-10-CM

## 2022-07-24 DIAGNOSIS — I130 Hypertensive heart and chronic kidney disease with heart failure and stage 1 through stage 4 chronic kidney disease, or unspecified chronic kidney disease: Secondary | ICD-10-CM | POA: Diagnosis present

## 2022-07-24 DIAGNOSIS — E1122 Type 2 diabetes mellitus with diabetic chronic kidney disease: Secondary | ICD-10-CM | POA: Diagnosis present

## 2022-07-24 DIAGNOSIS — N1832 Chronic kidney disease, stage 3b: Secondary | ICD-10-CM | POA: Diagnosis present

## 2022-07-24 DIAGNOSIS — D62 Acute posthemorrhagic anemia: Secondary | ICD-10-CM | POA: Diagnosis not present

## 2022-07-24 DIAGNOSIS — Z803 Family history of malignant neoplasm of breast: Secondary | ICD-10-CM | POA: Diagnosis not present

## 2022-07-24 DIAGNOSIS — Z6841 Body Mass Index (BMI) 40.0 and over, adult: Secondary | ICD-10-CM | POA: Diagnosis not present

## 2022-07-24 DIAGNOSIS — E559 Vitamin D deficiency, unspecified: Secondary | ICD-10-CM | POA: Diagnosis present

## 2022-07-24 DIAGNOSIS — E785 Hyperlipidemia, unspecified: Secondary | ICD-10-CM | POA: Diagnosis present

## 2022-07-24 DIAGNOSIS — Z9889 Other specified postprocedural states: Secondary | ICD-10-CM

## 2022-07-24 HISTORY — PX: LAPAROTOMY: SHX154

## 2022-07-24 HISTORY — PX: LAPAROSCOPY: SHX197

## 2022-07-24 HISTORY — PX: BOWEL RESECTION: SHX1257

## 2022-07-24 HISTORY — PX: UMBILICAL HERNIA REPAIR: SHX196

## 2022-07-24 LAB — CBC WITH DIFFERENTIAL/PLATELET
Absolute Monocytes: 1792 cells/uL — ABNORMAL HIGH (ref 200–950)
Basophils Absolute: 58 cells/uL (ref 0–200)
Basophils Relative: 0.2 %
Eosinophils Absolute: 0 cells/uL — ABNORMAL LOW (ref 15–500)
Eosinophils Relative: 0 %
HCT: 39.4 % (ref 35.0–45.0)
Hemoglobin: 13 g/dL (ref 11.7–15.5)
Lymphs Abs: 1705 cells/uL (ref 850–3900)
MCH: 29 pg (ref 27.0–33.0)
MCHC: 33 g/dL (ref 32.0–36.0)
MCV: 87.8 fL (ref 80.0–100.0)
MPV: 11.5 fL (ref 7.5–12.5)
Monocytes Relative: 6.2 %
Neutro Abs: 25345 cells/uL — ABNORMAL HIGH (ref 1500–7800)
Neutrophils Relative %: 87.7 %
Platelets: 447 10*3/uL — ABNORMAL HIGH (ref 140–400)
RBC: 4.49 10*6/uL (ref 3.80–5.10)
RDW: 14.7 % (ref 11.0–15.0)
Total Lymphocyte: 5.9 %
WBC: 28.9 10*3/uL — ABNORMAL HIGH (ref 3.8–10.8)

## 2022-07-24 LAB — COMPLETE METABOLIC PANEL WITH GFR
AG Ratio: 1.3 (calc) (ref 1.0–2.5)
ALT: 16 U/L (ref 6–29)
AST: 17 U/L (ref 10–35)
Albumin: 4.4 g/dL (ref 3.6–5.1)
Alkaline phosphatase (APISO): 100 U/L (ref 37–153)
BUN/Creatinine Ratio: 19 (calc) (ref 6–22)
BUN: 51 mg/dL — ABNORMAL HIGH (ref 7–25)
CO2: 24 mmol/L (ref 20–32)
Calcium: 9.8 mg/dL (ref 8.6–10.4)
Chloride: 96 mmol/L — ABNORMAL LOW (ref 98–110)
Creat: 2.67 mg/dL — ABNORMAL HIGH (ref 0.60–1.00)
Globulin: 3.5 g/dL (calc) (ref 1.9–3.7)
Glucose, Bld: 167 mg/dL — ABNORMAL HIGH (ref 65–99)
Potassium: 3.8 mmol/L (ref 3.5–5.3)
Sodium: 138 mmol/L (ref 135–146)
Total Bilirubin: 1.1 mg/dL (ref 0.2–1.2)
Total Protein: 7.9 g/dL (ref 6.1–8.1)
eGFR: 18 mL/min/{1.73_m2} — ABNORMAL LOW (ref >=60)

## 2022-07-24 LAB — URINALYSIS, ROUTINE W REFLEX MICROSCOPIC
Bilirubin Urine: NEGATIVE
Cellular Cast, UA: 4
Glucose, UA: NEGATIVE mg/dL
Hgb urine dipstick: NEGATIVE
Ketones, ur: NEGATIVE mg/dL
Nitrite: NEGATIVE
Protein, ur: 30 mg/dL — AB
Specific Gravity, Urine: 1.017 (ref 1.005–1.030)
pH: 5 (ref 5.0–8.0)

## 2022-07-24 LAB — HEMOGLOBIN A1C
Hgb A1c MFr Bld: 5.2 % of total Hgb (ref <5.7)
Mean Plasma Glucose: 103 mg/dL
eAG (mmol/L): 5.7 mmol/L

## 2022-07-24 LAB — LIPID PANEL
Cholesterol: 93 mg/dL (ref <200)
HDL: 47 mg/dL — ABNORMAL LOW (ref >=50)
LDL Cholesterol (Calc): 26 mg/dL (calc)
Non-HDL Cholesterol (Calc): 46 mg/dL (calc) (ref <130)
Total CHOL/HDL Ratio: 2 (calc) (ref <5.0)
Triglycerides: 110 mg/dL (ref <150)

## 2022-07-24 LAB — GLUCOSE, CAPILLARY
Glucose-Capillary: 99 mg/dL (ref 70–99)
Glucose-Capillary: 108 mg/dL — ABNORMAL HIGH (ref 70–99)
Glucose-Capillary: 136 mg/dL — ABNORMAL HIGH (ref 70–99)
Glucose-Capillary: 126 mg/dL — ABNORMAL HIGH (ref 70–99)

## 2022-07-24 LAB — TSH: TSH: 0.46 mIU/L (ref 0.40–4.50)

## 2022-07-24 LAB — VITAMIN D 25 HYDROXY (VIT D DEFICIENCY, FRACTURES): Vit D, 25-Hydroxy: 73 ng/mL (ref 30–100)

## 2022-07-24 SURGERY — LAPAROSCOPY, DIAGNOSTIC
Anesthesia: General | Site: Abdomen

## 2022-07-24 MED ORDER — OXYCODONE HCL 5 MG/5ML PO SOLN: 5.0000 mg | Freq: Once | ORAL | Status: DC | PRN

## 2022-07-24 MED ORDER — ONDANSETRON HCL 4 MG/2ML IJ SOLN
INTRAMUSCULAR | Status: AC
  Filled 2022-07-24: qty 2

## 2022-07-24 MED ORDER — MORPHINE SULFATE (PF) 2 MG/ML IV SOLN: 2.0000 mg | INTRAVENOUS | Status: DC | PRN

## 2022-07-24 MED ORDER — SODIUM CHLORIDE 0.9 % IV SOLN: INTRAVENOUS | Status: DC | PRN

## 2022-07-24 MED ORDER — PHENOL 1.4 % MT LIQD
1.0000 | OROMUCOSAL | Status: DC | PRN
  Administered 2022-07-24 – 2022-07-25 (×2): 1 via OROMUCOSAL
  Filled 2022-07-24: qty 177

## 2022-07-24 MED ORDER — VASOPRESSIN 20 UNIT/ML IV SOLN
INTRAVENOUS | Status: AC
  Filled 2022-07-24: qty 1

## 2022-07-24 MED ORDER — SUCCINYLCHOLINE CHLORIDE 200 MG/10ML IV SOSY
PREFILLED_SYRINGE | INTRAVENOUS | Status: DC | PRN
  Administered 2022-07-24: 160 mg via INTRAVENOUS

## 2022-07-24 MED ORDER — BUPIVACAINE HCL 0.25 % IJ SOLN
INTRAMUSCULAR | Status: DC | PRN
  Administered 2022-07-24: 7 mL

## 2022-07-24 MED ORDER — ONDANSETRON 4 MG PO TBDP: 4.0000 mg | ORAL_TABLET | Freq: Four times a day (QID) | ORAL | Status: DC | PRN

## 2022-07-24 MED ORDER — SUGAMMADEX SODIUM 200 MG/2ML IV SOLN
INTRAVENOUS | Status: DC | PRN
  Administered 2022-07-24: 300 mg via INTRAVENOUS

## 2022-07-24 MED ORDER — 0.9 % SODIUM CHLORIDE (POUR BTL) OPTIME
TOPICAL | Status: DC | PRN
  Administered 2022-07-24: 1000 mL

## 2022-07-24 MED ORDER — SODIUM CHLORIDE 0.9 % IV SOLN
1.0000 g | INTRAVENOUS | Status: DC
  Administered 2022-07-24 – 2022-07-25 (×2): 1 g via INTRAVENOUS
  Filled 2022-07-24 (×3): qty 10

## 2022-07-24 MED ORDER — CHLORHEXIDINE GLUCONATE CLOTH 2 % EX PADS
6.0000 | MEDICATED_PAD | Freq: Every day | CUTANEOUS | Status: DC
  Administered 2022-07-24 – 2022-07-26 (×3): 6 via TOPICAL

## 2022-07-24 MED ORDER — OXYCODONE HCL 5 MG PO TABS: 5.0000 mg | ORAL_TABLET | Freq: Once | ORAL | Status: DC | PRN

## 2022-07-24 MED ORDER — HYDRALAZINE HCL 20 MG/ML IJ SOLN: 10.0000 mg | INTRAMUSCULAR | Status: DC | PRN

## 2022-07-24 MED ORDER — BUPIVACAINE HCL (PF) 0.25 % IJ SOLN
INTRAMUSCULAR | Status: AC
  Filled 2022-07-24: qty 30

## 2022-07-24 MED ORDER — METRONIDAZOLE 500 MG/100ML IV SOLN
500.0000 mg | Freq: Two times a day (BID) | INTRAVENOUS | Status: AC
  Administered 2022-07-24 – 2022-07-29 (×10): 500 mg via INTRAVENOUS
  Filled 2022-07-24 (×10): qty 100

## 2022-07-24 MED ORDER — ONDANSETRON HCL 4 MG/2ML IJ SOLN: 4.0000 mg | Freq: Four times a day (QID) | INTRAMUSCULAR | Status: DC | PRN

## 2022-07-24 MED ORDER — LACTATED RINGERS IV SOLN: INTRAVENOUS | Status: DC

## 2022-07-24 MED ORDER — DORZOLAMIDE HCL-TIMOLOL MAL 2-0.5 % OP SOLN
1.0000 [drp] | Freq: Two times a day (BID) | OPHTHALMIC | Status: DC
Start: 2022-07-24 — End: 2022-08-01
  Administered 2022-07-24 – 2022-08-01 (×16): 1 [drp] via OPHTHALMIC
  Filled 2022-07-24: qty 10

## 2022-07-24 MED ORDER — ONDANSETRON HCL 4 MG/2ML IJ SOLN
INTRAMUSCULAR | Status: DC | PRN
  Administered 2022-07-24: 4 mg via INTRAVENOUS

## 2022-07-24 MED ORDER — FENTANYL CITRATE (PF) 100 MCG/2ML IJ SOLN
INTRAMUSCULAR | Status: DC | PRN
  Administered 2022-07-24: 100 ug via INTRAVENOUS
  Administered 2022-07-24: 50 ug via INTRAVENOUS

## 2022-07-24 MED ORDER — HYDROMORPHONE HCL 1 MG/ML IJ SOLN: 0.2500 mg | INTRAMUSCULAR | Status: DC | PRN

## 2022-07-24 MED ORDER — ACETAMINOPHEN 325 MG PO TABS: 650.0000 mg | ORAL_TABLET | Freq: Four times a day (QID) | ORAL | Status: DC | PRN

## 2022-07-24 MED ORDER — SODIUM CHLORIDE 0.9 % IV SOLN: INTRAVENOUS | Status: DC

## 2022-07-24 MED ORDER — PROPOFOL 10 MG/ML IV BOLUS
INTRAVENOUS | Status: AC
  Filled 2022-07-24: qty 20

## 2022-07-24 MED ORDER — LIDOCAINE HCL (CARDIAC) PF 100 MG/5ML IV SOSY
PREFILLED_SYRINGE | INTRAVENOUS | Status: DC | PRN
  Administered 2022-07-24: 40 mg via INTRAVENOUS

## 2022-07-24 MED ORDER — PHENYLEPHRINE HCL-NACL 20-0.9 MG/250ML-% IV SOLN
INTRAVENOUS | Status: DC | PRN
  Administered 2022-07-24: 40 ug/min via INTRAVENOUS

## 2022-07-24 MED ORDER — INSULIN ASPART 100 UNIT/ML IJ SOLN
0.0000 [IU] | Freq: Three times a day (TID) | INTRAMUSCULAR | Status: DC
  Administered 2022-07-24: 2 [IU] via SUBCUTANEOUS
  Administered 2022-07-25: 1 [IU] via SUBCUTANEOUS
  Administered 2022-07-26: 5 [IU] via SUBCUTANEOUS
  Administered 2022-07-27: 3 [IU] via SUBCUTANEOUS
  Administered 2022-07-27 – 2022-07-28 (×5): 2 [IU] via SUBCUTANEOUS
  Administered 2022-07-29: 3 [IU] via SUBCUTANEOUS
  Administered 2022-07-29: 2 [IU] via SUBCUTANEOUS
  Administered 2022-07-30 – 2022-07-31 (×2): 3 [IU] via SUBCUTANEOUS
  Administered 2022-07-31: 2 [IU] via SUBCUTANEOUS
  Administered 2022-07-31: 3 [IU] via SUBCUTANEOUS

## 2022-07-24 MED ORDER — ALBUMIN HUMAN 5 % IV SOLN: INTRAVENOUS | Status: DC | PRN

## 2022-07-24 MED ORDER — CIPROFLOXACIN IN D5W 400 MG/200ML IV SOLN
400.0000 mg | INTRAVENOUS | Status: AC
  Administered 2022-07-24: 400 mg via INTRAVENOUS
  Filled 2022-07-24: qty 200

## 2022-07-24 MED ORDER — INSULIN ASPART 100 UNIT/ML IJ SOLN: 0.0000 [IU] | Freq: Every day | INTRAMUSCULAR | Status: DC

## 2022-07-24 MED ORDER — VASOPRESSIN 20 UNIT/ML IV SOLN
INTRAVENOUS | Status: DC | PRN
  Administered 2022-07-24 (×2): 2 [IU] via INTRAVENOUS

## 2022-07-24 MED ORDER — PROMETHAZINE HCL 25 MG/ML IJ SOLN: 6.2500 mg | INTRAMUSCULAR | Status: DC | PRN

## 2022-07-24 MED ORDER — PROPOFOL 10 MG/ML IV BOLUS
INTRAVENOUS | Status: DC | PRN
  Administered 2022-07-24: 100 mg via INTRAVENOUS

## 2022-07-24 MED ORDER — CIPROFLOXACIN IN D5W 400 MG/200ML IV SOLN
400.0000 mg | Freq: Two times a day (BID) | INTRAVENOUS | Status: DC
  Filled 2022-07-24: qty 200

## 2022-07-24 MED ORDER — ROCURONIUM BROMIDE 100 MG/10ML IV SOLN
INTRAVENOUS | Status: DC | PRN
  Administered 2022-07-24: 60 mg via INTRAVENOUS

## 2022-07-24 MED ORDER — METRONIDAZOLE 500 MG/100ML IV SOLN
500.0000 mg | INTRAVENOUS | Status: AC
  Administered 2022-07-24: 500 mg via INTRAVENOUS
  Filled 2022-07-24: qty 100

## 2022-07-24 MED ORDER — SUCCINYLCHOLINE CHLORIDE 200 MG/10ML IV SOSY
PREFILLED_SYRINGE | INTRAVENOUS | Status: AC
  Filled 2022-07-24: qty 10

## 2022-07-24 MED ORDER — ACETAMINOPHEN 650 MG RE SUPP: 650.0000 mg | Freq: Four times a day (QID) | RECTAL | Status: DC | PRN

## 2022-07-24 MED ORDER — FENTANYL CITRATE (PF) 250 MCG/5ML IJ SOLN
INTRAMUSCULAR | Status: AC
  Filled 2022-07-24: qty 5

## 2022-07-24 MED ORDER — ENOXAPARIN SODIUM 40 MG/0.4ML IJ SOSY
40.0000 mg | PREFILLED_SYRINGE | INTRAMUSCULAR | Status: DC
  Administered 2022-07-25 – 2022-08-01 (×8): 40 mg via SUBCUTANEOUS
  Filled 2022-07-24 (×8): qty 0.4

## 2022-07-24 MED ORDER — PHENYLEPHRINE HCL (PRESSORS) 10 MG/ML IV SOLN
INTRAVENOUS | Status: DC | PRN
  Administered 2022-07-24 (×3): 160 ug via INTRAVENOUS

## 2022-07-24 MED ORDER — HYDROMORPHONE HCL 1 MG/ML IJ SOLN
1.0000 mg | INTRAMUSCULAR | Status: DC | PRN
  Filled 2022-07-24: qty 1

## 2022-07-24 MED ORDER — LIDOCAINE 2% (20 MG/ML) 5 ML SYRINGE
INTRAMUSCULAR | Status: AC
  Filled 2022-07-24: qty 5

## 2022-07-24 MED ORDER — CIPROFLOXACIN IN D5W 400 MG/200ML IV SOLN: 400.0000 mg | INTRAVENOUS | Status: DC

## 2022-07-24 MED ORDER — MIDAZOLAM HCL 2 MG/2ML IJ SOLN: 0.5000 mg | Freq: Once | INTRAMUSCULAR | Status: DC | PRN

## 2022-07-24 SURGICAL SUPPLY — 67 items
ADH SKN CLS APL DERMABOND .7 (GAUZE/BANDAGES/DRESSINGS) ×1
APL PRP STRL LF DISP 70% ISPRP (MISCELLANEOUS) ×1
BAG COUNTER SPONGE SURGICOUNT (BAG) ×3 IMPLANT
BAG SPNG CNTER NS LX DISP (BAG) ×1
BLADE CLIPPER SURG (BLADE) IMPLANT
BLADE SURG 10 STRL SS (BLADE) ×1 IMPLANT
BNDG GAUZE DERMACEA FLUFF (GAUZE/BANDAGES/DRESSINGS) ×1
BNDG GAUZE DERMACEA FLUFF 4 (GAUZE/BANDAGES/DRESSINGS) IMPLANT
BNDG GZE DERMACEA 4 6PLY (GAUZE/BANDAGES/DRESSINGS) ×1
CANISTER SUCT 3000ML PPV (MISCELLANEOUS) IMPLANT
CHLORAPREP W/TINT 26 (MISCELLANEOUS) ×3 IMPLANT
COVER SURGICAL LIGHT HANDLE (MISCELLANEOUS) ×4 IMPLANT
DERMABOND ADVANCED (GAUZE/BANDAGES/DRESSINGS) ×1
DERMABOND ADVANCED .7 DNX12 (GAUZE/BANDAGES/DRESSINGS) ×2 IMPLANT
DRAPE WARM FLUID 44X44 (DRAPES) ×3 IMPLANT
ELECT BLADE 4.0 EZ CLEAN MEGAD (MISCELLANEOUS) ×2
ELECT REM PT RETURN 9FT ADLT (ELECTROSURGICAL) ×2
ELECTRODE BLDE 4.0 EZ CLN MEGD (MISCELLANEOUS) IMPLANT
ELECTRODE REM PT RTRN 9FT ADLT (ELECTROSURGICAL) ×2 IMPLANT
GAUZE SPONGE 4X4 12PLY STRL (GAUZE/BANDAGES/DRESSINGS) ×1 IMPLANT
GLOVE BIO SURGEON STRL SZ7.5 (GLOVE) ×3 IMPLANT
GLOVE SURG SYN 7.5  E (GLOVE) ×2
GLOVE SURG SYN 7.5 E (GLOVE) ×1 IMPLANT
GLOVE SURG SYN 7.5 PF PI (GLOVE) ×2 IMPLANT
GOWN STRL REUS W/ TWL LRG LVL3 (GOWN DISPOSABLE) ×4 IMPLANT
GOWN STRL REUS W/ TWL XL LVL3 (GOWN DISPOSABLE) ×2 IMPLANT
GOWN STRL REUS W/TWL LRG LVL3 (GOWN DISPOSABLE) ×4
GOWN STRL REUS W/TWL XL LVL3 (GOWN DISPOSABLE) ×2
HANDLE SUCTION POOLE (INSTRUMENTS) IMPLANT
KIT BASIN OR (CUSTOM PROCEDURE TRAY) ×3 IMPLANT
KIT TURNOVER KIT B (KITS) ×3 IMPLANT
MAT PREVALON FULL STRYKER (MISCELLANEOUS) ×1 IMPLANT
NDL INSUFFLATION 14GA 120MM (NEEDLE) ×2 IMPLANT
NEEDLE INSUFFLATION 14GA 120MM (NEEDLE) ×2 IMPLANT
NS IRRIG 1000ML POUR BTL (IV SOLUTION) ×3 IMPLANT
PAD ABD 8X10 STRL (GAUZE/BANDAGES/DRESSINGS) ×1 IMPLANT
PAD ARMBOARD 7.5X6 YLW CONV (MISCELLANEOUS) ×6 IMPLANT
PENCIL SMOKE EVACUATOR (MISCELLANEOUS) ×1 IMPLANT
RELOAD PROXIMATE 75MM BLUE (ENDOMECHANICALS) ×6 IMPLANT
RELOAD STAPLE 75 3.8 BLU REG (ENDOMECHANICALS) IMPLANT
SCISSORS LAP 5X35 DISP (ENDOMECHANICALS) ×1 IMPLANT
SET IRRIG TUBING LAPAROSCOPIC (IRRIGATION / IRRIGATOR) IMPLANT
SET TUBE SMOKE EVAC HIGH FLOW (TUBING) ×3 IMPLANT
SLEEVE ENDOPATH XCEL 5M (ENDOMECHANICALS) ×3 IMPLANT
SLEEVE Z-THREAD 5X100MM (TROCAR) ×1 IMPLANT
SOL ANTI FOG 6CC (MISCELLANEOUS) IMPLANT
SOLUTION ANTI FOG 6CC (MISCELLANEOUS) ×1
SPONGE T-LAP 18X18 ~~LOC~~+RFID (SPONGE) ×1 IMPLANT
STAPLER PROXIMATE 75MM BLUE (STAPLE) ×1 IMPLANT
SUCTION POOLE HANDLE (INSTRUMENTS) ×2
SUT MNCRL AB 4-0 PS2 18 (SUTURE) ×3 IMPLANT
SUT PDS AB 1 CT  36 (SUTURE) ×4
SUT PDS AB 1 CT 36 (SUTURE) IMPLANT
SUT SILK 0 TIES 10X30 (SUTURE) ×1 IMPLANT
SUT SILK 2 0 SH CR/8 (SUTURE) ×1 IMPLANT
SUT SILK 3 0 SH CR/8 (SUTURE) ×1 IMPLANT
TAPE CLOTH SURG 4X10 WHT LF (GAUZE/BANDAGES/DRESSINGS) ×1 IMPLANT
TOWEL GREEN STERILE (TOWEL DISPOSABLE) ×3 IMPLANT
TOWEL GREEN STERILE FF (TOWEL DISPOSABLE) ×3 IMPLANT
TRAY LAPAROSCOPIC MC (CUSTOM PROCEDURE TRAY) ×3 IMPLANT
TROCAR XCEL 12X100 BLDLESS (ENDOMECHANICALS) IMPLANT
TROCAR XCEL BLUNT TIP 100MML (ENDOMECHANICALS) IMPLANT
TROCAR XCEL NON-BLD 11X100MML (ENDOMECHANICALS) IMPLANT
TROCAR Z-THREAD OPTICAL 5X100M (TROCAR) ×3 IMPLANT
TUBE CONNECTING 12X1/4 (SUCTIONS) ×1 IMPLANT
WARMER LAPAROSCOPE (MISCELLANEOUS) ×3 IMPLANT
YANKAUER SUCT BULB TIP NO VENT (SUCTIONS) ×1 IMPLANT

## 2022-07-24 NOTE — ED Notes (Signed)
Pt transferred from [place], report received from [place]. Admit for sepsis, SBO and incarcerated hernia. Per [facility], pt received 3L NS for hypotension while at [facility], arrives normotensive (109/62). NGT in place, [facility] states total 3864ml yellow/tan output. Triple lumen femoral CVC in place, LR infusing at 150/hr. Pt reports no abdominal or other pain at this time. Hooked to monitoring equipment, a&ox4, VSS.

## 2022-07-24 NOTE — ED Notes (Signed)
X-ray at bedside

## 2022-07-24 NOTE — Anesthesia Procedure Notes (Signed)
Procedure Name: Intubation Date/Time: 07/24/2022 4:32 AM  Performed by: Jajaira Ruis T, CRNAPre-anesthesia Checklist: Patient identified, Emergency Drugs available, Suction available and Patient being monitored Patient Re-evaluated:Patient Re-evaluated prior to induction Oxygen Delivery Method: Circle system utilized Preoxygenation: Pre-oxygenation with 100% oxygen Induction Type: IV induction, Rapid sequence and Cricoid Pressure applied Ventilation: Mask ventilation without difficulty Laryngoscope Size: Mac and 4 Grade View: Grade I Tube type: Oral Tube size: 7.5 mm Number of attempts: 1 Airway Equipment and Method: Stylet and Oral airway Placement Confirmation: ETT inserted through vocal cords under direct vision, positive ETCO2 and breath sounds checked- equal and bilateral Secured at: 22 cm Tube secured with: Tape Dental Injury: Teeth and Oropharynx as per pre-operative assessment

## 2022-07-24 NOTE — ED Notes (Signed)
Report called to [place]

## 2022-07-24 NOTE — H&P (Addendum)
History and Physical    Patient: Jocelyn Camacho DOB: 1946/04/19 DOA: 07/23/2022 DOS: the patient was seen and examined on 07/24/2022 PCP: [name], MD  Patient coming from: Home - lives with husband; NOK: Shira, [place], [phone]   Chief Complaint: Abdominal pain  HPI: Jocelyn Camacho is a 76 y.o. female with medical history significant of HTN; HLD; OSA: DM; hypothryoidism; and morbid obesity presenting with n/v and abdominal pain.  She reports that she developed abdominal pain and n/v a few days ago.  She was seen at her PCP office yesterday AM and they gave her PO Zofran and no IVF and sent her home, thinking gastroenteritis.  Her daughter works in an urgent care and didn't like that answer and sent her to [facility].  There was concern for sepsis with marked leukocytosis and elevated lactate.  CT showed an incarcerated umbilical hernia with obstruction and attempt at reduction was unsuccessful.  She was transferred to [facility] for surgery and had NG tube placed with significant brown drainage out.  She was taken to the OR early this AM and was found to have a strangulated hernia with ischemic bowel.  I saw her in the PACU post-operatively and she was awake and alert, reported feeling significantly better.    ER Course:  Carryover, per Dr. Marcello Moores:  76 year old female with history of hypertension and CHF presents with abdominal pain nausea vomiting CT scan shows incarcerated hernia.  ER physician discussed with general surgeon Dr. Rosendo Gros at [facility] and patient is being transferred to [facility], ER to ER transfer.  Patient also has acute renal failure due to vomiting.    Patient also had an area of transition and SBO secondary to hernia.  Patient was seen in the ER.  The hernia was unable to be reduced, patient was takento the operating room urgently for diagnostic laparoscopy. Noted to have strangulated hernia with associated ischemic bowel- patient post surgery to be  admitted to hospitalist service with surgery consult Dr Rosendo Gros     Review of Systems: As mentioned in the history of present illness. All other systems reviewed and are negative. Past Medical History:  Diagnosis Date   Allergic rhinitis    Allergy    Anxiety    with medical procedures   Hyperlipidemia    Hypertension    Obesity    OSA (obstructive sleep apnea)    wears CPAP   Type II or unspecified type diabetes mellitus with renal manifestations, not stated as uncontrolled(250.40)    Unspecified hypothyroidism    Vitamin D deficiency    Past Surgical History:  Procedure Laterality Date    eye procedure     have treatment where a needle is stuck into left eye- since June 2014; now every six weeks   [place]     Social History:  reports that she has never smoked. She has never used smokeless tobacco. She reports that she does not currently use alcohol. She reports that she does not use drugs.  Allergies  Allergen Reactions   Penicillins Swelling   Sulfa Antibiotics Itching and Rash    Family History  Problem Relation Age of Onset   Breast cancer Mother    Renal Disease Mother    Hypertension Sister    Diabetes Sister    Diabetes Maternal Grandmother    Colon cancer Neg Hx    Esophageal cancer Neg Hx    Stomach cancer Neg  Hx    Rectal cancer Neg Hx     Prior to Admission medications   Medication Sig Start Date End Date Taking? Authorizing Provider  allopurinol (ZYLOPRIM) 300 MG tablet TAKE 1/2 TABLET(150 MG) BY MOUTH DAILY FOR GOUT PREVENTION 01/22/22   Alycia Rossetti, NP  Ascorbic Acid (VITAMIN C) 1000 MG tablet Take 1,000 mg by mouth daily.    [provider]  aspirin EC 81 MG tablet Take 81 mg by mouth daily.    [provider]  atenolol (TENORMIN) 100 MG tablet Take 1 tablet by mouth once daily for blood pressure 02/10/22   Alycia Rossetti, NP  BLACK PEPPER-TURMERIC PO Take by mouth daily.     [provider]  Blood Glucose Monitoring Suppl (ONE TOUCH ULTRA 2) w/Device KIT check blood sugar 3 times daily-DX-E11.21 08/14/20   [name], MD  Cholecalciferol (VITAMIN D3) 5000 units CAPS Take 10,000 Units by mouth daily. Taking 5,000 units    [provider]  colchicine 0.6 MG tablet Take  1 tablet  Daily  to Prevent Gout                                                                 /                         TAKE                     BY                     MOUTH 05/18/22   [name], MD  Cyanocobalamin (B-12) 1000 MCG SUBL Place 1 tablet under the tongue 2 (two) times a week.    [provider]  dorzolamide-timolol (COSOPT) 22.3-6.8 MG/ML ophthalmic solution Place 1 drop into the left eye 2 (two) times daily. 05/03/21   [provider]  doxycycline (VIBRAMYCIN) 100 MG capsule Take 1 capsule (100 mg total) by mouth 2 (two) times daily. 07/23/22   Darrol Jump, NP  Dulaglutide (TRULICITY) 3 JJ/9.4RD SOPN Inject  3 MG(0.5 ML) into the skin  weekly for Diabetes (Dx: e10.29) 02/11/22   [name], MD  Finerenone (KERENDIA) 10 MG TABS Take 10 mg by mouth daily. Take  1 tablet  Daily  for Diabetic Kidneys 04/11/21   [name], MD  furosemide (LASIX) 80 MG tablet TAKE 1 TABLET BY MOUTH 1 TO 2 TIMES PER DAY AS NEEDED FOR FLUID RETENTION OR ANKLE SWELLING 10/15/21   Alycia Rossetti, NP  hyoscyamine (LEVSIN, ANASPAZ) 0.125 MG tablet Take 1 tablet (0.125 mg total) by mouth every 6 (six) hours as needed. 10/05/17   [name], MD  insulin NPH-regular Human (70-30) 100 UNIT/ML injection Patient injects 25 units in the AM and 10 units in the PM. Reduce each insulin dose by 2 units if glucose persistently <100 10/24/20   [name], NP  Lancets [facility] ULTRASOFT) lancets Check blood sugar 3 times daily-DX-E10.9 08/06/17   [name], MD  levothyroxine (SYNTHROID) 125 MCG tablet Take  1 tablet  Daily  on an empty stomach with only  water for 30 minutes & no Antacid meds, Calcium or Magnesium for 4 hours & avoid Biotin  12/29/21   [name], MD  losartan (COZAAR) 100 MG tablet Take 1 tablet (100 mg total) by mouth daily. 04/07/22   Alycia Rossetti, NP  ondansetron (ZOFRAN-ODT) 4 MG disintegrating tablet Take 1 tablet (4 mg total) by mouth every 8 (eight) hours as needed for nausea or vomiting. 07/23/22   Darrol Jump, NP  ONETOUCH ULTRA test strip CHECK BLOOD SUGAR 3 TIMES A DAY 05/18/22   [name], MD  polymyxin B 500,000 Units in sodium chloride irrigation 0.9 % 500 mL Irrigate with 1 application as directed every 30 (thirty) days. When getting her eye inj.    [provider]  rosuvastatin (CRESTOR) 5 MG tablet Take 1 tablet once weekly as directed for Cholesterol LDL goal less than 70. 08/12/21   [name], NP  zinc gluconate 50 MG tablet Take 50 mg by mouth daily.    [provider]    Physical Exam: Vitals:   07/24/22 1000 07/24/22 1045 07/24/22 1141 07/24/22 1223  BP: (!) 104/58  (!) 108/50 (!) 119/59  Pulse: 91 (!) 112 95 92  Resp: (!) 28 (!) $Remo'28 20 20  'fcYnE$ Temp:   97.7 F (36.5 C) 98.6 F (37 C)  TempSrc:    Oral  SpO2: 96% 92% 95% 94%  Weight:      Height:       General:  Appears calm and comfortable and is in NAD Eyes:  EOMI, normal lids, iris ENT:  grossly normal hearing, lips & tongue, mmm; NG tube in place and drainage brown liquid; some absent dentition Neck:  no LAD, masses or thyromegaly Cardiovascular:  RRR, no m/r/g. No LE edema.  Respiratory:   CTA bilaterally with no wheezes/rales/rhonchi.  Normal respiratory effort. Abdomen:  s/p surgery, bandage C/D/I Skin:  no rash or induration seen on limited exam Musculoskeletal:  grossly normal tone BUE/BLE, good ROM, no bony abnormality Psychiatric:  blunted mood and affect, speech fluent and appropriate, AOx3 Neurologic:  CN 2-12 grossly intact, moves all extremities in coordinated fashion   Radiological Exams on  Admission: Independently reviewed - see discussion in A/P where applicable  DG Abdomen 1 View  Result Date: 07/24/2022 CLINICAL DATA:  NG tube placement. EXAM: ABDOMEN - 1 VIEW COMPARISON:  CT earlier today. FINDINGS: The enteric tube tip and side-port below the diaphragm. The tube is looped in the distal stomach, tip directed towards the gastric cardia. Gaseous small bowel distention in the central abdomen is partially included. IMPRESSION: Enteric tube with tip and side-port below the diaphragm in the distal stomach. The tube is looped in the distal stomach, tip directed towards the gastric cardia. Electronically Signed   By: Keith Rake M.D.   On: 07/24/2022 01:09   CT ABDOMEN PELVIS WO CONTRAST  Result Date: 07/23/2022 CLINICAL DATA:  Acute abdominal pain EXAM: CT ABDOMEN AND PELVIS WITHOUT CONTRAST TECHNIQUE: Multidetector CT imaging of the abdomen and pelvis was performed following the standard protocol without IV contrast. RADIATION DOSE REDUCTION: This exam was performed according to the departmental dose-optimization program which includes automated exposure control, adjustment of the mA and/or kV according to patient size and/or use of iterative reconstruction technique. COMPARISON:  Chest x-ray from earlier in the same day. FINDINGS: Lower chest: Patchy airspace opacity is noted in the bases right considerably greater than left with a more infiltrative pattern seen than that noted on prior chest x-ray. Hepatobiliary: Cholelithiasis is noted. The liver is within normal limits. Pancreas: Unremarkable. No pancreatic ductal dilatation or surrounding inflammatory changes.  Spleen: Normal in size without focal abnormality. Adrenals/Urinary Tract: Adrenal glands are within normal limits. Kidneys are well visualized bilaterally. Tiny punctate stones are noted in the left lower pole. No obstructive changes are seen. The bladder is decompressed. Stomach/Bowel: No obstructive or inflammatory changes of  the colon are seen. The appendix is within normal limits. Distal small bowel is decompressed. The stomach is distended with fluid and the proximal small bowel is distended as well. These changes are secondary to an infraumbilical abdominal wall hernia containing both fat and a knuckle of small bowel. This causes significant luminal narrowing at the hernia site and proximal partial small bowel obstruction. Vascular/Lymphatic: Aortic atherosclerosis. No enlarged abdominal or pelvic lymph nodes. Right femoral vein central line is noted. Reproductive: Uterus and bilateral adnexa are unremarkable. Other: No ascites is noted. Musculoskeletal: No acute or significant osseous findings. IMPRESSION: Infraumbilical hernia containing a loop of small bowel with incarceration and proximal obstruction. Surgical consultation is recommended. Bibasilar airspace opacity left greater than right. Tiny nonobstructing left renal calculi. Electronically Signed   By: Inez Catalina M.D.   On: 07/23/2022 22:43   DG Chest Port 1 View  Result Date: 07/23/2022 CLINICAL DATA:  Nausea and vomiting, initial encounter EXAM: PORTABLE CHEST 1 VIEW COMPARISON:  10/09/2008 FINDINGS: Cardiac shadow is stable. Bibasilar atelectatic changes are noted left greater than right. No sizable effusion is noted. No acute bony abnormality is seen. IMPRESSION: Bibasilar atelectasis left greater than right. Electronically Signed   By: Inez Catalina M.D.   On: 07/23/2022 19:46    EKG: Independently reviewed.  NSR with rate 98;  LVH; nonspecific ST changes with no evidence of acute ischemia   Labs on Admission: I have personally reviewed the available labs and imaging studies at the time of the admission.  Pertinent labs:    Glucose 210 BUN 62/Creatinine 3.76/GFR 12; 30/1.41/39 on 3/14 Anion gap 20 HS troponin 23, 25 Lactate 3.3, 2.3 WBC 28.5 Platelets 488 UA: small LE Blood and urine cultures pending   Assessment and Plan: Principal Problem:    Strangulated umbilical hernia Active Problems:   Essential hypertension   Type 2 diabetes mellitus with stage 3 chronic kidney disease, with long-term current use of insulin (HCC)   Hypothyroidism   Hyperlipidemia associated with type 2 diabetes mellitus (HCC)   Obesity, Class III, BMI 40-49.9 (morbid obesity) ([place])   AKI (acute kidney injury) ([place])   Glaucoma    Strangulated hernia with ischemic bowel -Patient presented with acute onset of n/v and abdominal pain -Further evaluation led to diagnosis of an incarcerated hernia which was actually strangulated with bowel ischemia -She is s/p resection and feels much better -NG tube is in place -Surgery is managing this issue -She is on Cefepime per pharmacy -Admit to progressive for now given severity of illness  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 -Appears to have baseline stage 3b CKD but she had not been able to eat/drink and had n/v -Anticipate resolution with IVF -Avoid nephrotoxic agents where possible -Hold finerenone -Recheck BMP in AM  HTN -Hold home BP medications (atenolol, losartan) and resume when needed for BP and able to take PO -Will cover with IV hydralazine prn for now  Chronic diastolic CHF -11/8785 echo with preserved EF and grade 1 diastolic dysfunction -She appears to be compensated at this time -Hold ASA for now -Hold Lasix  DM -A1c is 5.2, indicating good control -hold Trulicity, 76/72 -Cover with moderate-scale SSI   HLD -Resume Crestor when able to tolerate  PO  Hypothyroidism -Resume Synthroid when able to effectively take PO  Gout -Resume allopurinol and colchicine when able to take PO  Glaucoma Continue home drops - Cosopt  OSA -Continue CPAP  Morbid obesity -Body mass index is 41.88 kg/m..  -Weight loss should be encouraged -Outpatient PCP/bariatric medicine f/u encouraged    Advance Care Planning:   Code Status: Full Code   Consults: Surgery  DVT Prophylaxis: SCDs  Family Communication:  None present in PACU; the surgeon would have spoken with the family post-operatively today  Severity of Illness: The appropriate patient status for this patient is INPATIENT. Inpatient status is judged to be reasonable and necessary in order to provide the required intensity of service to ensure the patient's safety. The patient's presenting symptoms, physical exam findings, and initial radiographic and laboratory data in the context of their chronic comorbidities is felt to place them at high risk for further clinical deterioration. Furthermore, it is not anticipated that the patient will be medically stable for discharge from the hospital within 2 midnights of admission.   * I certify that at the point of admission it is my clinical judgment that the patient will require inpatient hospital care spanning beyond 2 midnights from the point of admission due to high intensity of service, high risk for further deterioration and high frequency of surveillance required.*  Author: Karmen Bongo, MD 07/24/2022 4:57 PM  For on call review [email or web address].

## 2022-07-24 NOTE — Transfer of Care (Signed)
Immediate Anesthesia Transfer of Care Note  Patient: Jocelyn Camacho  Procedure(s) Performed: LAPAROSCOPY DIAGNOSTIC (Abdomen) EXPLORATORY LAPAROTOMY (Abdomen) SMALL BOWEL RESECTION (Abdomen) PRIMARY UMBILICAL HERNIA REPAIR (Abdomen)  Patient Location: PACU  Anesthesia Type:General  Level of Consciousness: awake, alert  and oriented  Airway & Oxygen Therapy: Patient Spontanous Breathing and Patient connected to face mask oxygen  Post-op Assessment: Report given to RN, Post -op Vital signs reviewed and stable and Patient moving all extremities  Post vital signs: Reviewed and stable  Last Vitals:  Vitals Value Taken Time  BP 103/58 07/24/22 0607  Temp    Pulse 92 07/24/22 0611  Resp 25 07/24/22 0611  SpO2 91 % 07/24/22 0611  Vitals shown include unvalidated device data.  Last Pain:  Vitals:   07/24/22 0127  TempSrc: Temporal  PainSc:          Complications: No notable events documented.

## 2022-07-24 NOTE — ED Notes (Addendum)
#  16 FR NG tube inserted to left nares without difficulty. Placement was confirmed by ausculation and immediate 1910ml of orange colored gastric contents noted. NG placed on Low Intermittent suction. Chest x-ray ordered.

## 2022-07-24 NOTE — Progress Notes (Signed)
Patient ambulated in PACU with walker to bathroom and voided X1. Patient tolerated walk well and is now sitting up in a chair. [address] 32 bed is cleaning.   Rowe Pavy, RN

## 2022-07-24 NOTE — ED Provider Notes (Signed)
Patient transferred from med center for evaluation by general surgery and hospitalist. I discussed with Dr. Rosendo Gros who will see the patient.  Patient was endorsed to try hospitalist Dr. Hal Hope for admission Patient is awake and alert at this time.  Her vital signs are appropriate.  She denies any pain at this time   Ripley Fraise, MD 07/24/22 [phone]

## 2022-07-24 NOTE — Anesthesia Preprocedure Evaluation (Addendum)
Anesthesia Evaluation  Patient identified by MRN, date of birth, ID band Patient awake    Reviewed: Allergy & Precautions, NPO status , Patient's Chart, lab work & pertinent test results, reviewed documented beta blocker date and time   History of Anesthesia Complications Negative for: history of anesthetic complications  Airway Mallampati: II  TM Distance: >3 FB Neck ROM: Full    Dental  (+) Edentulous Upper, Upper Dentures, Dental Advisory Given   Pulmonary sleep apnea and Continuous Positive Airway Pressure Ventilation ,    breath sounds clear to auscultation       Cardiovascular hypertension, Pt. on medications and Pt. on home beta blockers (-) angina+ Orthopnea   Rhythm:Regular Rate:Normal  '19 ECHO: EF 65-70%. Wall motion was normal, no regional wall motion abnormalities.  grade 1 DD, aortic sclerosis without stenosis, trivial TR   Neuro/Psych Anxiety negative neurological ROS     GI/Hepatic Neg liver ROS, incarcerated infraumbilical hernia with small bowel and transition point: N/V, has NG tube   Endo/Other  diabetes, Insulin Dependent, Oral Hypoglycemic AgentsHypothyroidism Morbid obesity  Renal/GU Renal InsufficiencyRenal disease ( reat 3.76)     Musculoskeletal   Abdominal (+) + obese,   Peds  Hematology negative hematology ROS (+)   Anesthesia Other Findings   Reproductive/Obstetrics                            Anesthesia Physical Anesthesia Plan  ASA: 3 and emergent  Anesthesia Plan: General   Post-op Pain Management: Ofirmev IV (intra-op)*   Induction: Intravenous and Rapid sequence  PONV Risk Score and Plan: 3 and Ondansetron, Dexamethasone and Treatment may vary due to age or medical condition  Airway Management Planned: Oral ETT  Additional Equipment: None  Intra-op Plan:   Post-operative Plan: Possible Post-op intubation/ventilation  Informed Consent: I have  reviewed the patients History and Physical, chart, labs and discussed the procedure including the risks, benefits and alternatives for the proposed anesthesia with the patient or authorized representative who has indicated his/her understanding and acceptance.     Dental advisory given  Plan Discussed with: CRNA and Surgeon  Anesthesia Plan Comments:        Anesthesia Quick Evaluation

## 2022-07-24 NOTE — Anesthesia Postprocedure Evaluation (Signed)
Anesthesia Post Note  Patient: Jocelyn Camacho  Procedure(s) Performed: LAPAROSCOPY DIAGNOSTIC (Abdomen) EXPLORATORY LAPAROTOMY (Abdomen) SMALL BOWEL RESECTION (Abdomen) PRIMARY UMBILICAL HERNIA REPAIR (Abdomen)     Patient location during evaluation: PACU Anesthesia Type: General Level of consciousness: awake and alert, patient cooperative and oriented Pain management: pain level controlled Vital Signs Assessment: post-procedure vital signs reviewed and stable Respiratory status: spontaneous breathing, nonlabored ventilation, respiratory function stable and patient connected to nasal cannula oxygen Cardiovascular status: blood pressure returned to baseline and stable Postop Assessment: no apparent nausea or vomiting Anesthetic complications: no   No notable events documented.  Last Vitals:  Vitals:   07/24/22 0710 07/24/22 0725  BP: 102/62 99/64  Pulse: 93 96  Resp: 20 (!) 24  Temp:    SpO2: 100% 95%    Last Pain:  Vitals:   07/24/22 0640  TempSrc:   PainSc: 0-No pain                 Bobie Kistler,E. Wenceslaus Gist

## 2022-07-24 NOTE — ED Notes (Signed)
#  8 FR in and out cath inserted with immediate urine return noted using sterile technique, tolerated well.

## 2022-07-24 NOTE — Op Note (Addendum)
07/24/2022  5:36 AM  PATIENT:  Randa Spike  76 y.o. female  PRE-OPERATIVE DIAGNOSIS:  incarcerated hernia  POST-OPERATIVE DIAGNOSIS: Strangulated hernia, ischemic bowel  PROCEDURE:  Procedure(s): LAPAROSCOPY DIAGNOSTIC (N/A) EXPLORATORY LAPAROTOMY (N/A) SMALL BOWEL RESECTION (N/A) PRIMARY INCISIONAL HERNIA REPAIR (N/A)  SURGEON:  Surgeon(s) and Role:    * Ralene Ok, MD - Primary  ANESTHESIA:   local and general  EBL:  25 mL   BLOOD ADMINISTERED:none  DRAINS: none   LOCAL MEDICATIONS USED:  NONE  SPECIMEN:  Source of Specimen: Small bowel  DISPOSITION OF SPECIMEN:  PATHOLOGY  COUNTS:  YES  TOURNIQUET:  * No tourniquets in log *  DICTATION: .Dragon Dictation Indication procedure: Patient is a 76 year old female with a 6-day history of abdominal pain.  Patient presented to outside ER was found to have an incarcerated incisional hernia.  Patient also had an area of transition and SBO secondary to hernia.  Patient was seen in the ER.  The hernia was unable to be reduced patient was taken back to the operating room urgently for diagnostic laparoscopy.  Findings: Patient had a approximately a 3 cm hernia just inferior to the umbilicus in the midline.  A portion of the transverse colon, and small bowel was strangulated within the hernia.  The small bowel was ischemic.  This was resected.  She underwent a side-to-side anastomosis.  The portion of the colon that was within the hernia was completely viable.  The midline skin was left open.  Details of procedure: After the patient was consented she was taken back to the OR and placed in supine position with bilateral SCDs in place.  She underwent general endotracheal anesthesia.  Patient was then prepped and draped in the standard fashion.  A timeout was called and all facts were verified.  A Veress needle technique was used insufflate the abdomen to 15 mmHg.  This was done on the left subcostal margin.  Subsequent to this a  5 mm trocar and camera placed intra-abdominal.  There is no injury to any intra-abdominal organs.  It was evident there was a incarcerated small bowel containing incisional hernia just inferior to the umbilicus.  At this time a 5 mm trocar was placed left lower quadrant direct visualization.  At this time I attempted to reduce the incarcerated small bowel however was unsuccessful.  At this time I decided to continue with a laparotomy.  10.  Blade was used to make an upper midline incision.  Dissection was taken down to fascia.  Cautery was used to maintain hemostasis.  At this time the hernia sac was entered.  There was a large amount of preperitoneal fat that was ischemic.  The omentum that was within the hernia sac was delivered through the incision site.  At this time it was evident there was a portion of small bowel was ischemic.  This was brought up through the incision site.  At this time I created a mesenteric window both proximal and distal to the ischemic portion of bowel.  The skin portion of bowel was approximately 5 to 6 cm long.  A 76 GIA stapler was used to transect each end of the small bowel.  Corner staple line on each end was transected.  The 38 GIA stapler was used to create an anastomosis.  The staple lines were offset.  A 3-0 silk apex stitch was used to reapproximate the apex.  At this time the anastomosis was dunked back into the abdominal cavity.  I reexamined  the omentum that was contained within the hernia sac as well as the transverse colon.  These both appear to be viable.  At this time the hernia sac was excised and this was also ischemic.  There is this did contain some preperitoneal fat.  This was discarded.  At this time the midline fascia was then reapproximated using #1 PDS single-stranded in a running fashion x2.  The skin was left open, and packed with saline soaked Kerlix..  The trocars were removed and the skin was then reapproximated using 4 Monocryl subcuticular  fashion.  Patient tolerated the procedure well was taken to the recovery in stable condition.   CASE DATA:  Type of patient?: DOW CASE (Surgical Hospitalist [facility])  Status of Case? EMERGENT Add On  Infection Present At Time Of Surgery (PATOS)?   Ischemic bowel      PLAN OF CARE: Admit to inpatient   PATIENT DISPOSITION:  PACU - hemodynamically stable.   Delay start of Pharmacological VTE agent (>24hrs) due to surgical blood loss or risk of bleeding: not applicable

## 2022-07-24 NOTE — Progress Notes (Signed)
PHARMACY NOTE:  ANTIMICROBIAL RENAL DOSAGE ADJUSTMENT  Current antimicrobial regimen includes a mismatch between antimicrobial dosage and estimated renal function.  As per policy approved by the Pharmacy & Therapeutics and Medical Executive Committees, the antimicrobial dosage will be adjusted accordingly.  Current antimicrobial dosage:  Ciprofloxacin 400mg  IV q12h  Indication: Peritonitis  Renal Function:  Estimated Creatinine Clearance: 14.4 mL/min (A) (by C-G formula based on SCr of 3.76 mg/dL (H)). []      On intermittent HD, scheduled: []      On CRRT    Antimicrobial dosage has been changed to:  Ciprofloxacin 400mg  IV q24h  Additional comments:  Patient on cefepime therapy as well. Similar spectrum of activity. Would suggest d/c cipro and maintain on cefepime/flagyl. Will discuss with primary MD   Chaya Dehaan A. Levada Dy, PharmD, BCPS, FNKF Clinical Pharmacist [place] Please utilize Amion for appropriate phone number to reach the unit pharmacist ([place])  07/24/2022 1:52 PM

## 2022-07-24 NOTE — Consult Note (Addendum)
Reason for Consult: Incarcerated hernia Referring Physician: Dr. Susa Raring Jocelyn Camacho is an 76 y.o. female.  HPI: Patient is a 76 year old female with a history of hypertension, hyperlipidemia, diabetes, obesity who comes in with a incarcerated hernia.  Patient states that she has had some pain discomfort started on Friday.  States this was followed by nausea vomiting.  States that she had significant nausea vomiting over the last several days.  Patient presented to the outside ER secondary to decreased p.o.  Patient went CT scan.  Patient was found to have a incarcerated infraumbilical hernia with small bowel and transition point.  This was attempted to reduce however was unsuccessful at outside hospital.  Patient was transferred to [facility] ER for further evaluation and management.  Patient states that she had previous BTL laparoscopically in the past.  She appears to have an incision just inferior to umbilicus in the midline.    Past Medical History:  Diagnosis Date   Allergic rhinitis    Allergy    Anxiety    with medical procedures   Hyperlipidemia    Hypertension    Obesity    OSA (obstructive sleep apnea)    wears CPAP   Type II or unspecified type diabetes mellitus with renal manifestations, not stated as uncontrolled(250.40)    Unspecified hypothyroidism    Vitamin D deficiency     Past Surgical History:  Procedure Laterality Date    eye procedure     have treatment where a needle is stuck into left eye- since June 2014; now every six weeks   DENTAL EXAMINATION UNDER ANESTHESIA     TUBAL LIGATION      Family History  Problem Relation Age of Onset   Breast cancer Mother    Renal Disease Mother    Hypertension Sister    Diabetes Sister    Diabetes Maternal Grandmother    Colon cancer Neg Hx    Esophageal cancer Neg Hx    Stomach cancer Neg Hx    Rectal cancer Neg Hx     Social History:  reports that she has never smoked. She has never used smokeless  tobacco. She reports that she does not currently use alcohol. She reports that she does not use drugs.  Allergies:  Allergies  Allergen Reactions   Penicillins Swelling   Sulfa Antibiotics Itching and Rash    Medications: I have reviewed the patient's current medications.  Results for orders placed or performed during the hospital encounter of 07/23/22 (from the past 48 hour(s))  CBG monitoring, ED     Status: Abnormal   Collection Time: 07/23/22  7:00 PM  Result Value Ref Range   Glucose-Capillary 209 (H) 70 - 99 mg/dL    Comment: Glucose reference range applies only to samples taken after fasting for at least 8 hours.  Comprehensive metabolic panel     Status: Abnormal   Collection Time: 07/23/22  7:17 PM  Result Value Ref Range   Sodium 139 135 - 145 mmol/L   Potassium 4.2 3.5 - 5.1 mmol/L   Chloride 95 (L) 98 - 111 mmol/L   CO2 24 22 - 32 mmol/L   Glucose, Bld 210 (H) 70 - 99 mg/dL    Comment: Glucose reference range applies only to samples taken after fasting for at least 8 hours.   BUN 62 (H) 8 - 23 mg/dL   Creatinine, Ser 3.76 (H) 0.44 - 1.00 mg/dL   Calcium 10.4 (H) 8.9 - 10.3 mg/dL  Total Protein 8.2 (H) 6.5 - 8.1 g/dL   Albumin 4.5 3.5 - 5.0 g/dL   AST 28 15 - 41 U/L   ALT 19 0 - 44 U/L   Alkaline Phosphatase 95 38 - 126 U/L   Total Bilirubin 1.2 0.3 - 1.2 mg/dL   GFR, Estimated 12 (L) >60 mL/min    Comment: (NOTE) Calculated using the CKD-EPI Creatinine Equation (2021)    Anion gap 20 (H) 5 - 15    Comment: Performed at [facility], [place], [place] [address]  Lactic acid, plasma     Status: Abnormal   Collection Time: 07/23/22  7:17 PM  Result Value Ref Range   Lactic Acid, Venous 3.3 (HH) 0.5 - 1.9 mmol/L    Comment: CRITICAL RESULT CALLED TO, READ BACK BY AND VERIFIED WITH: Christianne Borrow, RN 07/23/22 2005 Performed at [facility], [place], [place], [place] [address]   CBC with Differential      Status: Abnormal   Collection Time: 07/23/22  7:17 PM  Result Value Ref Range   WBC 28.5 (H) 4.0 - 10.5 K/uL   RBC 4.85 3.87 - 5.11 MIL/uL   Hemoglobin 13.8 12.0 - 15.0 g/dL   HCT 42.1 36.0 - 46.0 %   MCV 86.8 80.0 - 100.0 fL   MCH 28.5 26.0 - 34.0 pg   MCHC 32.8 30.0 - 36.0 g/dL   RDW 14.2 11.5 - 15.5 %   Platelets 488 (H) 150 - 400 K/uL   nRBC 0.0 0.0 - 0.2 %   Neutrophils Relative % 85 %   Neutro Abs 24.3 (H) 1.7 - 7.7 K/uL   Lymphocytes Relative 7 %   Lymphs Abs 1.9 0.7 - 4.0 K/uL   Monocytes Relative 7 %   Monocytes Absolute 2.0 (H) 0.1 - 1.0 K/uL   Eosinophils Relative 0 %   Eosinophils Absolute 0.0 0.0 - 0.5 K/uL   Basophils Relative 0 %   Basophils Absolute 0.0 0.0 - 0.1 K/uL   WBC Morphology MORPHOLOGY UNREMARKABLE    Smear Review PLATELETS APPEAR ADEQUATE    Immature Granulocytes 1 %   Abs Immature Granulocytes 0.24 (H) 0.00 - 0.07 K/uL   Ovalocytes PRESENT    Platelet Morphology NORMAL     Comment: Performed at [facility], [address] [place], [place] [address]  Protime-INR     Status: None   Collection Time: 07/23/22  7:17 PM  Result Value Ref Range   Prothrombin Time 14.2 11.4 - 15.2 seconds   INR 1.1 0.8 - 1.2    Comment: (NOTE) INR goal varies based on device and disease states. Performed at [facility], [address], [place], [place] [address]   APTT     Status: None   Collection Time: 07/23/22  7:17 PM  Result Value Ref Range   aPTT 25 24 - 36 seconds    Comment: Performed at [facility], [place], [place] [address]  Troponin I (High Sensitivity)     Status: Abnormal   Collection Time: 07/23/22  7:17 PM  Result Value Ref Range   Troponin I (High Sensitivity) 23 (H) <18 ng/L    Comment: (NOTE) Elevated high sensitivity troponin I (hsTnI) values and significant  changes across serial measurements may suggest ACS but many other  chronic and acute conditions are  known to elevate hsTnI results.  Refer to the "Links" section for chest pain algorithms and additional  guidance. Performed  at [facility], [address] [place], [place] [address]   Lactic acid, plasma     Status: Abnormal   Collection Time: 07/23/22 10:07 PM  Result Value Ref Range   Lactic Acid, Venous 2.3 (HH) 0.5 - 1.9 mmol/L    Comment: CRITICAL VALUE NOTED.  VALUE IS CONSISTENT WITH PREVIOUSLY REPORTED AND CALLED VALUE. Performed at [facility], [address] [place], [place] [address]   Troponin I (High Sensitivity)     Status: Abnormal   Collection Time: 07/23/22 10:07 PM  Result Value Ref Range   Troponin I (High Sensitivity) 25 (H) <18 ng/L    Comment: (NOTE) Elevated high sensitivity troponin I (hsTnI) values and significant  changes across serial measurements may suggest ACS but many other  chronic and acute conditions are known to elevate hsTnI results.  Refer to the "Links" section for chest pain algorithms and additional  guidance. Performed at [facility], [address] [place], [place] [address]   Urinalysis, Routine w reflex microscopic     Status: Abnormal   Collection Time: 07/24/22 12:01 AM  Result Value Ref Range   Color, Urine YELLOW YELLOW   APPearance HAZY (A) CLEAR   Specific Gravity, Urine 1.017 1.005 - 1.030   pH 5.0 5.0 - 8.0   Glucose, UA NEGATIVE NEGATIVE mg/dL   Hgb urine dipstick NEGATIVE NEGATIVE   Bilirubin Urine NEGATIVE NEGATIVE   Ketones, ur NEGATIVE NEGATIVE mg/dL   Protein, ur 30 (A) NEGATIVE mg/dL   Nitrite NEGATIVE NEGATIVE   Leukocytes,Ua SMALL (A) NEGATIVE   RBC / HPF 0-5 0 - 5 RBC/hpf   WBC, UA 0-5 0 - 5 WBC/hpf   Bacteria, UA RARE (A) NONE SEEN   Squamous Epithelial / LPF 0-5 0 - 5   Mucus PRESENT    Hyaline Casts, UA PRESENT    Cellular Cast, UA 4     Comment: Performed at [facility], [place], [place] [address]     DG Abdomen 1 View  Result Date: 07/24/2022 CLINICAL DATA:  NG tube placement. EXAM: ABDOMEN - 1 VIEW COMPARISON:  CT earlier today. FINDINGS: The enteric tube tip and side-port below the diaphragm. The tube is looped in the distal stomach, tip directed towards the gastric cardia. Gaseous small bowel distention in the central abdomen is partially included. IMPRESSION: Enteric tube with tip and side-port below the diaphragm in the distal stomach. The tube is looped in the distal stomach, tip directed towards the gastric cardia. Electronically Signed   By: Keith Rake M.D.   On: 07/24/2022 01:09   CT ABDOMEN PELVIS WO CONTRAST  Result Date: 07/23/2022 CLINICAL DATA:  Acute abdominal pain EXAM: CT ABDOMEN AND PELVIS WITHOUT CONTRAST TECHNIQUE: Multidetector CT imaging of the abdomen and pelvis was performed following the standard protocol without IV contrast. RADIATION DOSE REDUCTION: This exam was performed according to the departmental dose-optimization program which includes automated exposure control, adjustment of the mA and/or kV according to patient size and/or use of iterative reconstruction technique. COMPARISON:  Chest x-ray from earlier in the same day. FINDINGS: Lower chest: Patchy airspace opacity is noted in the bases right considerably greater than left with a more infiltrative pattern seen than that noted on prior chest x-ray. Hepatobiliary: Cholelithiasis is noted. The liver is within normal limits. Pancreas: Unremarkable. No pancreatic ductal dilatation or surrounding inflammatory changes. Spleen: Normal in size without focal abnormality. Adrenals/Urinary Tract: Adrenal glands are within normal limits. Kidneys are well visualized  bilaterally. Tiny punctate stones are noted in the left lower pole. No obstructive changes are seen. The bladder is decompressed. Stomach/Bowel: No obstructive or inflammatory changes of the colon are seen. The appendix is within normal limits. Distal small  bowel is decompressed. The stomach is distended with fluid and the proximal small bowel is distended as well. These changes are secondary to an infraumbilical abdominal wall hernia containing both fat and a knuckle of small bowel. This causes significant luminal narrowing at the hernia site and proximal partial small bowel obstruction. Vascular/Lymphatic: Aortic atherosclerosis. No enlarged abdominal or pelvic lymph nodes. Right femoral vein central line is noted. Reproductive: Uterus and bilateral adnexa are unremarkable. Other: No ascites is noted. Musculoskeletal: No acute or significant osseous findings. IMPRESSION: Infraumbilical hernia containing a loop of small bowel with incarceration and proximal obstruction. Surgical consultation is recommended. Bibasilar airspace opacity left greater than right. Tiny nonobstructing left renal calculi. Electronically Signed   By: Inez Catalina M.D.   On: 07/23/2022 22:43   DG Chest Port 1 View  Result Date: 07/23/2022 CLINICAL DATA:  Nausea and vomiting, initial encounter EXAM: PORTABLE CHEST 1 VIEW COMPARISON:  10/09/2008 FINDINGS: Cardiac shadow is stable. Bibasilar atelectatic changes are noted left greater than right. No sizable effusion is noted. No acute bony abnormality is seen. IMPRESSION: Bibasilar atelectasis left greater than right. Electronically Signed   By: Inez Catalina M.D.   On: 07/23/2022 19:46    Review of Systems  Constitutional:  Negative for chills and fever.  HENT:  Negative for ear discharge, hearing loss and sore throat.   Eyes:  Negative for discharge.  Respiratory:  Negative for cough and shortness of breath.   Cardiovascular:  Negative for chest pain and leg swelling.  Gastrointestinal:  Positive for abdominal pain, nausea and vomiting. Negative for constipation and diarrhea.  Musculoskeletal:  Negative for myalgias and neck pain.  Skin:  Negative for rash.  Allergic/Immunologic: Negative for environmental allergies.  Neurological:   Negative for dizziness and seizures.  Hematological:  Does not bruise/bleed easily.  Psychiatric/Behavioral:  Negative for suicidal ideas.   All other systems reviewed and are negative.  Blood pressure (!) 110/92, pulse 100, temperature 97.9 F (36.6 C), resp. rate 18, height 5\' 2"  (1.575 m), weight 103.9 kg, SpO2 100 %. Physical Exam Constitutional:      Appearance: She is well-developed.     Comments: Conversant No acute distress  HENT:     Head: Normocephalic and atraumatic.  Eyes:     General: Lids are normal. No scleral icterus.    Pupils: Pupils are equal, round, and reactive to light.     Comments: Pupils are equal round and reactive No lid lag Moist conjunctiva  Neck:     Thyroid: No thyromegaly.     Trachea: No tracheal tenderness.     Comments: No cervical lymphadenopathy Cardiovascular:     Rate and Rhythm: Normal rate and regular rhythm.     Heart sounds: No murmur heard. Pulmonary:     Effort: Pulmonary effort is normal.     Breath sounds: Normal breath sounds. No wheezing or rales.  Abdominal:     Tenderness: There is abdominal tenderness in the periumbilical area. There is no guarding or rebound.     Hernia: A hernia is present. Hernia is present in the ventral area.    Musculoskeletal:     Cervical back: Normal range of motion and neck supple.  Skin:    General: Skin is warm.  Findings: No rash.     Nails: There is no clubbing.     Comments: Normal skin turgor  Neurological:     Mental Status: She is alert and oriented to person, place, and time.     Comments: Normal gait and station  Psychiatric:        Mood and Affect: Mood normal.        Thought Content: Thought content normal.        Judgment: Judgment normal.     Comments: Appropriate affect     Assessment/Plan: 76 year old female with incarcerated hernia causing SBO. Hypertension Hyperlipidemia Morbid obesity Diabetes  1.  We will proceed to the operating for emergent diagnostic  laparoscopy possible ex lap and possible small bowel resection.  Patient will likely require primary hernia repair.  I discussed with the patient risk benefits of the procedure to include biological infection, bleeding, damage fractures, possible need for further surgery, likely recurrence of hernia.  Patient voiced understanding wishes to proceed.  Ralene Ok 07/24/2022, 3:06 AM

## 2022-07-24 NOTE — ED Notes (Signed)
[facility] in here to transport pt to [facility]. Pt stable at transfer.

## 2022-07-25 ENCOUNTER — Encounter (HOSPITAL_COMMUNITY): Payer: Self-pay | Admitting: General Surgery

## 2022-07-25 DIAGNOSIS — K420 Umbilical hernia with obstruction, without gangrene: Secondary | ICD-10-CM | POA: Diagnosis not present

## 2022-07-25 LAB — BASIC METABOLIC PANEL
Anion gap: 11 (ref 5–15)
BUN: 44 mg/dL — ABNORMAL HIGH (ref 8–23)
CO2: 25 mmol/L (ref 22–32)
Calcium: 8.6 mg/dL — ABNORMAL LOW (ref 8.9–10.3)
Chloride: 108 mmol/L (ref 98–111)
Creatinine, Ser: 2.18 mg/dL — ABNORMAL HIGH (ref 0.44–1.00)
GFR, Estimated: 23 mL/min — ABNORMAL LOW (ref >60)
Glucose, Bld: 142 mg/dL — ABNORMAL HIGH (ref 70–99)
Potassium: 3.7 mmol/L (ref 3.5–5.1)
Sodium: 144 mmol/L (ref 135–145)

## 2022-07-25 LAB — CBC
HCT: 28.2 % — ABNORMAL LOW (ref 36.0–46.0)
Hemoglobin: 9.5 g/dL — ABNORMAL LOW (ref 12.0–15.0)
MCH: 29.5 pg (ref 26.0–34.0)
MCHC: 33.7 g/dL (ref 30.0–36.0)
MCV: 87.6 fL (ref 80.0–100.0)
Platelets: 315 10*3/uL (ref 150–400)
RBC: 3.22 MIL/uL — ABNORMAL LOW (ref 3.87–5.11)
RDW: 13.8 % (ref 11.5–15.5)
WBC: 10.9 10*3/uL — ABNORMAL HIGH (ref 4.0–10.5)
nRBC: 0 % (ref 0.0–0.2)

## 2022-07-25 LAB — GLUCOSE, CAPILLARY
Glucose-Capillary: 110 mg/dL — ABNORMAL HIGH (ref 70–99)
Glucose-Capillary: 121 mg/dL — ABNORMAL HIGH (ref 70–99)
Glucose-Capillary: 90 mg/dL (ref 70–99)

## 2022-07-25 LAB — SURGICAL PATHOLOGY

## 2022-07-25 LAB — MAGNESIUM: Magnesium: 2.3 mg/dL (ref 1.7–2.4)

## 2022-07-25 LAB — PHOSPHORUS: Phosphorus: 3.2 mg/dL (ref 2.5–4.6)

## 2022-07-25 NOTE — Progress Notes (Signed)
CPAP set up and at bedside. Unable to place due to NG tube. Will follow up throughout shift to assess need.

## 2022-07-25 NOTE — Progress Notes (Addendum)
1 Day Post-Op  Subjective: CC: Pain around her incision. No nausea. NGT bilious. No flatus or bm. Oob yesterday to the bathroom. Voiding.   Objective: Vital signs in last 24 hours: Temp:  [97.7 F (36.5 C)-99.6 F (37.6 C)] 98.7 F (37.1 C) (08/04 0615) Pulse Rate:  [88-112] 88 (08/04 0615) Resp:  [20-28] 20 (08/03 1223) BP: (102-119)/(50-59) 108/59 (08/04 0615) SpO2:  [92 %-96 %] 96 % (08/04 0615)    Intake/Output from previous day: 08/03 0701 - 08/04 0700 In: 670.8 [I.V.:570.8; IV Piggyback:100] Out: 240 [Emesis/NG output:240] Intake/Output this shift: No intake/output data recorded.  PE: Gen:  Alert, NAD, pleasant Pulm: rate and effort normal Abd: Soft, mild distension, ttp around the midline wound that appears appropriate without rigidity or guarding, laparoscopic incisions with glue intact appears well and are without drainage, bleeding, or signs of infection. Midline wound clean with granulation tissue at the base and sides, No evidence of dehiscence. NGT in place with bilious output.   Lab Results:  Recent Labs    07/23/22 1917 07/25/22 0203  WBC 28.5* 10.9*  HGB 13.8 9.5*  HCT 42.1 28.2*  PLT 488* 315   BMET Recent Labs    07/23/22 1917 07/25/22 0203  NA 139 144  K 4.2 3.7  CL 95* 108  CO2 24 25  GLUCOSE 210* 142*  BUN 62* 44*  CREATININE 3.76* 2.18*  CALCIUM 10.4* 8.6*   PT/INR Recent Labs    07/23/22 1917  LABPROT 14.2  INR 1.1   CMP     Component Value Date/Time   NA 144 07/25/2022 0203   K 3.7 07/25/2022 0203   CL 108 07/25/2022 0203   CO2 25 07/25/2022 0203   GLUCOSE 142 (H) 07/25/2022 0203   BUN 44 (H) 07/25/2022 0203   CREATININE 2.18 (H) 07/25/2022 0203   CREATININE 2.67 (H) 07/23/2022 1056   CALCIUM 8.6 (L) 07/25/2022 0203   PROT 8.2 (H) 07/23/2022 1917   ALBUMIN 4.5 07/23/2022 1917   AST 28 07/23/2022 1917   ALT 19 07/23/2022 1917   ALKPHOS 95 07/23/2022 1917   BILITOT 1.2 07/23/2022 1917   GFRNONAA 23 (L)  07/25/2022 0203   GFRNONAA 38 (L) 03/04/2021 1110   GFRAA 44 (L) 03/04/2021 1110   Lipase  No results found for: "LIPASE"  Studies/Results: DG Abdomen 1 View  Result Date: 07/24/2022 CLINICAL DATA:  NG tube placement. EXAM: ABDOMEN - 1 VIEW COMPARISON:  CT earlier today. FINDINGS: The enteric tube tip and side-port below the diaphragm. The tube is looped in the distal stomach, tip directed towards the gastric cardia. Gaseous small bowel distention in the central abdomen is partially included. IMPRESSION: Enteric tube with tip and side-port below the diaphragm in the distal stomach. The tube is looped in the distal stomach, tip directed towards the gastric cardia. Electronically Signed   By: Keith Rake M.D.   On: 07/24/2022 01:09   CT ABDOMEN PELVIS WO CONTRAST  Result Date: 07/23/2022 CLINICAL DATA:  Acute abdominal pain EXAM: CT ABDOMEN AND PELVIS WITHOUT CONTRAST TECHNIQUE: Multidetector CT imaging of the abdomen and pelvis was performed following the standard protocol without IV contrast. RADIATION DOSE REDUCTION: This exam was performed according to the departmental dose-optimization program which includes automated exposure control, adjustment of the mA and/or kV according to patient size and/or use of iterative reconstruction technique. COMPARISON:  Chest x-ray from earlier in the same day. FINDINGS: Lower chest: Patchy airspace opacity is noted in the bases right considerably greater  than left with a more infiltrative pattern seen than that noted on prior chest x-ray. Hepatobiliary: Cholelithiasis is noted. The liver is within normal limits. Pancreas: Unremarkable. No pancreatic ductal dilatation or surrounding inflammatory changes. Spleen: Normal in size without focal abnormality. Adrenals/Urinary Tract: Adrenal glands are within normal limits. Kidneys are well visualized bilaterally. Tiny punctate stones are noted in the left lower pole. No obstructive changes are seen. The bladder is  decompressed. Stomach/Bowel: No obstructive or inflammatory changes of the colon are seen. The appendix is within normal limits. Distal small bowel is decompressed. The stomach is distended with fluid and the proximal small bowel is distended as well. These changes are secondary to an infraumbilical abdominal wall hernia containing both fat and a knuckle of small bowel. This causes significant luminal narrowing at the hernia site and proximal partial small bowel obstruction. Vascular/Lymphatic: Aortic atherosclerosis. No enlarged abdominal or pelvic lymph nodes. Right femoral vein central line is noted. Reproductive: Uterus and bilateral adnexa are unremarkable. Other: No ascites is noted. Musculoskeletal: No acute or significant osseous findings. IMPRESSION: Infraumbilical hernia containing a loop of small bowel with incarceration and proximal obstruction. Surgical consultation is recommended. Bibasilar airspace opacity left greater than right. Tiny nonobstructing left renal calculi. Electronically Signed   By: Inez Catalina M.D.   On: 07/23/2022 22:43   DG Chest Port 1 View  Result Date: 07/23/2022 CLINICAL DATA:  Nausea and vomiting, initial encounter EXAM: PORTABLE CHEST 1 VIEW COMPARISON:  10/09/2008 FINDINGS: Cardiac shadow is stable. Bibasilar atelectatic changes are noted left greater than right. No sizable effusion is noted. No acute bony abnormality is seen. IMPRESSION: Bibasilar atelectasis left greater than right. Electronically Signed   By: Inez Catalina M.D.   On: 07/23/2022 19:46    Anti-infectives: Anti-infectives (From admission, onward)    Start     Dose/Rate Route Frequency Ordered Stop   07/25/22 0500  ciprofloxacin (CIPRO) IVPB 400 mg  Status:  Discontinued        400 mg 200 mL/hr over 60 Minutes Intravenous Every 24 hours 07/24/22 1350 07/24/22 1444   07/24/22 2100  ceFEPIme (MAXIPIME) 1 g in sodium chloride 0.9 % 100 mL IVPB  Status:  Discontinued        1 g 200 mL/hr over 30  Minutes Intravenous Every 24 hours 07/23/22 2029 07/24/22 0658   07/24/22 2100  metroNIDAZOLE (FLAGYL) IVPB 500 mg        500 mg 100 mL/hr over 60 Minutes Intravenous Every 12 hours 07/24/22 0659 07/29/22 2059   07/24/22 2100  ceFEPIme (MAXIPIME) 1 g in sodium chloride 0.9 % 100 mL IVPB        1 g 200 mL/hr over 30 Minutes Intravenous Every 24 hours 07/24/22 0658     07/24/22 2000  ciprofloxacin (CIPRO) IVPB 400 mg  Status:  Discontinued        400 mg 200 mL/hr over 60 Minutes Intravenous Every 12 hours 07/24/22 0659 07/24/22 1350   07/24/22 0315  ciprofloxacin (CIPRO) IVPB 400 mg        400 mg 200 mL/hr over 60 Minutes Intravenous STAT 07/24/22 0311 07/24/22 0441   07/24/22 0315  metroNIDAZOLE (FLAGYL) IVPB 500 mg        500 mg 100 mL/hr over 60 Minutes Intravenous STAT 07/24/22 0311 07/24/22 0457   07/23/22 2000  ceFEPIme (MAXIPIME) 2 g in sodium chloride 0.9 % 100 mL IVPB        2 g 200 mL/hr over 30 Minutes Intravenous  Once 07/23/22 1957 07/23/22 2239   07/23/22 2000  metroNIDAZOLE (FLAGYL) IVPB 500 mg        500 mg 100 mL/hr over 60 Minutes Intravenous  Once 07/23/22 1957 07/23/22 2240        Assessment/Plan POD 1 s/p Dx Laparoscopy, exploratory laparotomy, SBR, primary repair of incisional hernia - Dr. Rosendo Gros, 07/24/2022 - Cont NGT. AROBF - Cont abx 5d post op. Trend labs, follow vitals - BID WTD midline - Mobilize, PT - Pulm toilet   FEN - NPO, NGT, IVF per primary  VTE - SCDs, Lovenox ID - Cefepime/Flagyl. Afebrile. WBC improved from 28 to 10.9  AKI on CKD3b - Cr improving this am (3.76 > 2.18) ABL anemia - hgb 9.3 from 13.8, recheck in am HTN Chronic dCHF DM2 HLD Hypothyroidism OSA on CPAP   LOS: 1 day    Jillyn Ledger , [facility] 07/25/2022, 9:20 AM Please see Amion for pager number during day hours 7:00am-4:30pm

## 2022-07-25 NOTE — Evaluation (Signed)
Physical Therapy Evaluation Patient Details Name: Jocelyn Camacho MRN: [id number] DOB: Jan 06, 1946 Today's Date: 07/25/2022  History of Present Illness  Patient is a 76 y/o female who presents to [facility] on 07/23/22 due to abdominal pain, N/V. Found to have SBO and incarcerated hernia, transferred to [facility] and now s/p exp laparatomy, small bowel resection and hernia repair 07/24/22. PMH includes HTN, anxiety, DM, OSA, obesity.  Clinical Impression  Patient presents with lethargy, generalized weakness, pain, impaired mobility and post surgical deficits s/p above. Pt lives at home with spouse and reports being independent for ADLs and ambulation PTA. Pt is sleepy during assessment today and requires mod A for bed mobility and Min guard-Min A for transfers/ambulation with use of RW for support. Education on log roll technique for abdominal comfort. Will be able to tolerate more once less sleepy, recommend decreasing pain meds to allow for better participation with mobility. Likely will progress well with increased activity. Will follow acutely to maximize independence and mobility prior to return home.     Recommendations for follow up therapy are one component of a multi-disciplinary discharge planning process, led by the attending physician.  Recommendations may be updated based on patient status, additional functional criteria and insurance authorization.  Follow Up Recommendations Home health PT      Assistance Recommended at Discharge Intermittent Supervision/Assistance  Patient can return home with the following  A little help with walking and/or transfers;A little help with bathing/dressing/bathroom;Help with stairs or ramp for entrance;Assist for transportation;Assistance with cooking/housework    Equipment Recommendations Rolling walker (2 wheels);BSC/3in1  Recommendations for Other Services       Functional Status Assessment Patient has had a recent decline in their functional status and  demonstrates the ability to make significant improvements in function in a reasonable and predictable amount of time.     Precautions / Restrictions Precautions Precautions: Fall;Other (comment) Precaution Comments: NGT to suction Restrictions Weight Bearing Restrictions: No      Mobility  Bed Mobility Overal bed mobility: Needs Assistance Bed Mobility: Rolling, Sidelying to Sit, Sit to Supine Rolling: Mod assist Sidelying to sit: Mod assist, HOB elevated   Sit to supine: Min guard   General bed mobility comments: Cues for log roll technique for abdominal comfort, step by step cues, assist with rolling, reaching for rail and elevating trunk tog et to EOB. Able to bring LEs into bed to return to supine.    Transfers Overall transfer level: Needs assistance Equipment used: Rolling walker (2 wheels) Transfers: Sit to/from Stand Sit to Stand: Min assist, From elevated surface           General transfer comment: Min A to power to standing from EOB x1, from toilet x1, cues for hand placement/technique.    Ambulation/Gait Ambulation/Gait assistance: Min guard Gait Distance (Feet): 12 Feet (x2 bouts) Assistive device: Rolling walker (2 wheels) Gait Pattern/deviations: Step-through pattern, Trunk flexed, Shuffle, Decreased step length - right, Decreased step length - left Gait velocity: decreased Gait velocity interpretation: <1.31 ft/sec, indicative of household ambulator   General Gait Details: Slow, mildly unsteady gait with pt leaning on forearms with RW for support, decreased foot clearance bilaterally.  Stairs            Wheelchair Mobility    Modified Rankin (Stroke Patients Only)       Balance Overall balance assessment: Needs assistance Sitting-balance support: Feet supported, No upper extremity supported Sitting balance-Leahy Scale: Fair     Standing balance support: During functional activity  Standing balance-Leahy Scale: Poor Standing balance  comment: Able to stand for a few mins for pericare, to change gown and wash hands leaning on RW for support.                             Pertinent Vitals/Pain Pain Assessment Pain Assessment: Faces Faces Pain Scale: Hurts little more Pain Location: abdomen, NGT when touched Pain Descriptors / Indicators: Grimacing, Sore, Discomfort Pain Intervention(s): Monitored during session, Repositioned    Home Living Family/patient expects to be discharged to:: Private residence Living Arrangements: Spouse/significant other Available Help at Discharge: Family;Available 24 hours/day Type of Home: House Home Access: Level entry       Home Layout: One level Home Equipment: None      Prior Function Prior Level of Function : Independent/Modified Independent             Mobility Comments: Drives, does IADLs       Hand Dominance        Extremity/Trunk Assessment   Upper Extremity Assessment Upper Extremity Assessment: Defer to OT evaluation    Lower Extremity Assessment Lower Extremity Assessment: Generalized weakness       Communication   Communication: No difficulties  Cognition Arousal/Alertness: Lethargic Behavior During Therapy: Flat affect Overall Cognitive Status: Within Functional Limits for tasks assessed                                 General Comments: Sleepy due to pain meds. Slow processing and decreased initiation. Increased time.        General Comments General comments (skin integrity, edema, etc.): Spouse and other family/friends present during session.    Exercises     Assessment/Plan    PT Assessment Patient needs continued PT services  PT Problem List Decreased strength;Decreased mobility;Pain;Decreased balance;Decreased activity tolerance;Decreased knowledge of use of DME;Decreased cognition       PT Treatment Interventions Therapeutic activities;Gait training;Therapeutic exercise;Patient/family education;Balance  training;Functional mobility training;DME instruction    PT Goals (Current goals can be found in the Care Plan section)  Acute Rehab PT Goals Patient Stated Goal: return to independence PT Goal Formulation: With patient Time For Goal Achievement: 08/08/22 Potential to Achieve Goals: Good    Frequency Min 3X/week     Co-evaluation               AM-PAC PT "6 Clicks" Mobility  Outcome Measure Help needed turning from your back to your side while in a flat bed without using bedrails?: A Little Help needed moving from lying on your back to sitting on the side of a flat bed without using bedrails?: A Lot Help needed moving to and from a bed to a chair (including a wheelchair)?: A Little Help needed standing up from a chair using your arms (e.g., wheelchair or bedside chair)?: A Little Help needed to walk in hospital room?: Total Help needed climbing 3-5 steps with a railing? : A Lot 6 Click Score: 14    End of Session   Activity Tolerance: Patient limited by lethargy;Patient tolerated treatment well Patient left: in bed;with call bell/phone within reach;with family/visitor present;Other (comment) (replace NGt to suction) Nurse Communication: Mobility status PT Visit Diagnosis: Pain;Muscle weakness (generalized) (M62.81);Difficulty in walking, not elsewhere classified (R26.2);Unsteadiness on feet (R26.81) Pain - part of body:  (abdomen)    Time: 5784-6962 PT Time Calculation (min) (ACUTE ONLY): 24 min  Charges:   PT Evaluation $PT Eval Moderate Complexity: 1 Mod PT Treatments $Therapeutic Activity: 8-22 mins        Marisa Severin, PT, DPT Acute Rehabilitation Services Secure chat preferred Office [place] 07/25/2022, 2:34 PM

## 2022-07-25 NOTE — Progress Notes (Signed)
  Transition of Care [facility]) Screening Note   Patient Details  Name: Jocelyn Camacho Date of Birth: 01/30/1946   Transition of Care [facility]) CM/SW Contact:    Bartholomew Crews, RN Phone Number: [phone] 07/25/2022, 11:01 AM    Transition of Care Department [facility]) has reviewed patient and no TOC needs have been identified at this time. We will continue to monitor patient advancement through interdisciplinary progression rounds. If new patient transition needs arise, please place a TOC consult.

## 2022-07-25 NOTE — Progress Notes (Signed)
PROGRESS NOTE    Jocelyn Camacho  HYQ:[id number] DOB: 01-Aug-1946 DOA: 07/23/2022 PCP: Lucky Cowboy, MD    Brief Narrative:   Jocelyn Camacho is a 76 y.o. female with past medical history significant for essential hypertension, type 2 diabetes mellitus, hypothyroidism, hyperlipidemia, chronic diastolic congestive heart failure, CKD stage IIIb, gout, glaucoma, OSA, morbid obesity who presented to [facility] ED on 8/2 with progressive nausea/vomiting and abdominal pain.  Onset a few days prior, she was seen by her PCP and was prescribed oral Zofran and sent back home as thought this was related to gastroenteritis.  Daughter was concerned that there was some thing else going on and patient was sent to med [facility] for further evaluation.  In the ED, temperature 96.6 F, HR 112, BP 104/70, RR 37, SPO2 100% on room air.  WBC 28.9, hemoglobin 13.0, platelets 447.  Sodium 138, potassium 3.8, chloride 96, CO2 24, BUN 51, creatinine 2.67, glucose 167.  AST 17, ALT 16, total bilirubin 1.1.  Lactic acid 3.3.  TSH 0.46.  Hemoglobin A1c 5.2.  Urinalysis with small leukocytes, negative nitrite, rare bacteria, 0-5 WBCs.  CT abdomen/pelvis without contrast with infraumbilical hernia containing a small bowel loop with incarceration and proximal obstruction.  General surgery was consulted.  Patient was transferred to [facility].  Hospitalist concern for further evaluation and management of incarcerated hernia with subsequent bowel obstruction.  Assessment & Plan:   Incarcerated hernia with ischemic bowel  Patient presenting initially to urgent care followed by [facility] with progressive abdominal pain with associated nausea vomiting.  Patient with notable leukocytosis and elevated lactic acid.  Imaging notable for infraumbilical hernia containing a small bowel loop with incarceration of proximal obstruction.  Patient was transferred to [facility] and patient underwent exploratory laparotomy  with small bowel resection and hernia repair by Dr. Derrell Lolling 07/24/2022. --General surgery following, appreciate assistance --WBC 28.9>>10.9 --Lactic acid 3.3>2.3 --Pathology: Pending --Continue NG tube to LWIS --Cefepime 1 g IV every 24 hours --Metronidazole 500 mg IV every 12 hours --N.p.o. --LR at 75 mL/h --Supportive care, awaiting return of bowel function --Repeat CBC in a.m. with lactic acid  Acute renal failure on CKD stage IIIb Patient presenting with a creatinine of 2.67 with peak of 3.76 during hospitalization.  Baseline creatinine 1.4-1.6.  Etiology likely secondary to prerenal azotemia in the setting of nausea and vomiting from bowel obstruction as above. --Cr 2.67>3.76>2.18 --Continue IV fluid hydration as above --Avoid nephrotoxins, renal dose all medications --BMP daily  Acute postoperative blood loss anemia Hemoglobin 13.0 on admission, postoperatively down to 9.5. --CBC daily --Transfuse for hemoglobin less than 7.0; or active bleeding  Essential hypertension Chronic diastolic congestive heart failure, compensated Home regimen includes atenolol 100 mg p.o. daily furosemide 40 mg p.o. daily, losartan 100 mg p.o. daily, Finerenone 10 mg p.o. daily. --BP 108/59 this morning, well controlled --Continue to hold antihypertensives --Continue monitor BP closely  Type 2 diabetes mellitus Hemoglobin A1c 5.2, well controlled.  Home regimen includes NPH 20 units every morning, 5 units every afternoon, Trulicity 3 mg weekly. --SSI for coverage --CBGs 4 times daily while n.p.o.  Hypothyroidism On levothyroxine 125 mcg p.o. daily at home.  TSH within normal limits. --Holding oral medications given n.p.o. status currently, if remains n.p.o. greater than 7 days will initiate IV levothyroxine  Hyperlipidemia: Holding statin for now  Gout: Holding allopurinol until able to tolerate p.o.  Glaucoma: Continue eyedrops  OSA --Continue nocturnal  CPAP  Weakness/debility/deconditioning: --PT/OT evaluation  Morbid obesity Body mass index is 41.88 kg/m.  Discussed with patient needs for aggressive lifestyle changes/weight loss as this complicates all facets of care.  Outpatient follow-up with PCP.     DVT prophylaxis: enoxaparin (LOVENOX) injection 40 mg Start: 07/25/22 1000 SCDs Start: 07/24/22 1046    Code Status: Full Code Family Communication: Updated spouse present at bedside this morning  Disposition Plan:  Level of care: Progressive Status is: Inpatient Remains inpatient appropriate because: P.o. until return of bowel function, NG tube in place, sign off from general surgery; pending PT/OT evaluation    Consultants:  General surgery, Dr. Derrell Lolling  Procedures:  Exploratory laparotomy with small bowel resection and hernia repair, Dr. Derrell Lolling 8/3  Antimicrobials:  Cefepime 8/2>> Metronidazole 8/2>> Ciprofloxacin 8/2 - 8/2   Subjective: Patient seen examined bedside, resting comfortably.  Lying in bed.  Spouse present.  Feels weak and fatigued, otherwise no other complaints.  Pain controlled.  Mains n.p.o. until return of bowel function.  Discussed with general surgery PA this morning.  No other questions or concerns at this time.  Denies headache, no dizziness, no chest pain, no palpitations, no shortness of breath, no fever/chills/night sweats, no nausea/vomiting/diarrhea, no focal weakness, no cough/congestion, no paresthesias.  No acute events overnight per nursing staff.  Objective: Vitals:   07/24/22 1141 07/24/22 1223 07/24/22 2139 07/25/22 0615  BP: (!) 108/50 (!) 119/59 (!) 108/52 (!) 108/59  Pulse: 95 92 100 88  Resp: 20 20    Temp: 97.7 F (36.5 C) 98.6 F (37 C) 99.6 F (37.6 C) 98.7 F (37.1 C)  TempSrc:  Oral Oral Oral  SpO2: 95% 94% 92% 96%  Weight:      Height:        Intake/Output Summary (Last 24 hours) at 07/25/2022 1202 Last data filed at 07/25/2022 0620 Gross per 24 hour  Intake 670.84  ml  Output 240 ml  Net 430.84 ml   Filed Weights   07/23/22 2122  Weight: 103.9 kg    Examination:  Physical Exam: GEN: NAD, alert and oriented x 3, obese HEENT: NCAT, PERRL, EOMI, sclera clear, MMM, NG tube noted to low wall suction PULM: CTAB w/o wheezes/crackles, normal respiratory effort CV: RRR w/o M/G/R GI: abd soft, nondistended, mild tenderness to palpation surrounding surgical site, no appreciable bowel sounds, surgical dressing in place, clean/dry/intact MSK: no peripheral edema, muscle strength globally intact 5/5 bilateral upper/lower extremities, noted right groin central line in place NEURO: CN II-XII intact, no focal deficits, sensation to light touch intact PSYCH: normal mood/affect Integumentary: dry/intact, no rashes or wounds    Data Reviewed: I have personally reviewed following labs and imaging studies  CBC: Recent Labs  Lab 07/23/22 1056 07/23/22 1917 07/25/22 0203  WBC 28.9* 28.5* 10.9*  NEUTROABS 25,345* 24.3*  --   HGB 13.0 13.8 9.5*  HCT 39.4 42.1 28.2*  MCV 87.8 86.8 87.6  PLT 447* 488* 315   Basic Metabolic Panel: Recent Labs  Lab 07/23/22 1056 07/23/22 1917 07/25/22 0203  NA 138 139 144  K 3.8 4.2 3.7  CL 96* 95* 108  CO2 24 24 25   GLUCOSE 167* 210* 142*  BUN 51* 62* 44*  CREATININE 2.67* 3.76* 2.18*  CALCIUM 9.8 10.4* 8.6*  MG  --   --  2.3  PHOS  --   --  3.2   GFR: Estimated Creatinine Clearance: 24.8 mL/min (A) (by C-G formula based on SCr of 2.18 mg/dL (H)). Liver Function Tests: Recent Labs  Lab 07/23/22  1056 07/23/22 1917  AST 17 28  ALT 16 19  ALKPHOS  --  95  BILITOT 1.1 1.2  PROT 7.9 8.2*  ALBUMIN  --  4.5   No results for input(s): "LIPASE", "AMYLASE" in the last 168 hours. No results for input(s): "AMMONIA" in the last 168 hours. Coagulation Profile: Recent Labs  Lab 07/23/22 1917  INR 1.1   Cardiac Enzymes: No results for input(s): "CKTOTAL", "CKMB", "CKMBINDEX", "TROPONINI" in the last 168  hours. BNP (last 3 results) No results for input(s): "PROBNP" in the last 8760 hours. HbA1C: Recent Labs    07/23/22 1056  HGBA1C 5.2   CBG: Recent Labs  Lab 07/24/22 1304 07/24/22 1602 07/24/22 2043 07/25/22 0813 07/25/22 1122  GLUCAP 108* 136* 126* 110* 121*   Lipid Profile: Recent Labs    07/23/22 1056  CHOL 93  HDL 47*  LDLCALC 26  TRIG 578  CHOLHDL 2.0   Thyroid Function Tests: Recent Labs    07/23/22 1056  TSH 0.46   Anemia Panel: No results for input(s): "VITAMINB12", "FOLATE", "FERRITIN", "TIBC", "IRON", "RETICCTPCT" in the last 72 hours. Sepsis Labs: Recent Labs  Lab 07/23/22 1917 07/23/22 2207  LATICACIDVEN 3.3* 2.3*    Recent Results (from the past 240 hour(s))  Culture, blood (Routine x 2)     Status: None (Preliminary result)   Collection Time: 07/23/22  7:00 PM   Specimen: BLOOD  Result Value Ref Range Status   Specimen Description   Final    BLOOD RIGHT Performed at [facility], [address], [place], [place] [address]    Special Requests   Final    BOTTLES DRAWN AEROBIC AND ANAEROBIC Blood Culture adequate volume Performed at [facility], [address], [place], [place] [address]    Culture   Final    NO GROWTH 1 DAY Performed at [facility], [address] [place], [place] [address]    Report Status PENDING  Incomplete  Culture, blood (Routine x 2)     Status: None (Preliminary result)   Collection Time: 07/23/22  7:17 PM   Specimen: BLOOD  Result Value Ref Range Status   Specimen Description   Final    BLOOD RIGHT ANTECUBITAL Performed at [facility], [address] [place], [place] [address]    Special Requests   Final    Blood Culture adequate volume BOTTLES DRAWN AEROBIC AND ANAEROBIC Performed at [facility], [address] [place], [place] [address]    Culture   Final    NO GROWTH 2 DAYS Performed at [facility], [address] [place], [place] [address]    Report Status PENDING  Incomplete  Urine Culture     Status: Abnormal (Preliminary result)   Collection Time: 07/24/22 12:01 AM   Specimen: In/Out Cath Urine  Result Value Ref Range Status   Specimen Description   Final    IN/OUT CATH URINE Performed at [facility], [address] [place], [place] [address]    Special Requests   Final    NONE Performed at [facility], [address], [place], [place] [address]    Culture (A)  Final    1,000 COLONIES/mL ESCHERICHIA COLI CULTURE REINCUBATED FOR BETTER GROWTH Performed at [facility], [address], [place], [place] [address]    Report Status PENDING  Incomplete         Radiology Studies: DG Abdomen 1 View  Result Date:  07/24/2022 CLINICAL DATA:  NG tube placement. EXAM: ABDOMEN - 1 VIEW COMPARISON:  CT earlier today. FINDINGS: The enteric tube tip and side-port below the diaphragm. The tube is looped in the distal stomach, tip directed towards the gastric cardia. Gaseous small bowel distention in the central abdomen is partially included. IMPRESSION: Enteric tube with tip and side-port below the diaphragm in the distal stomach. The tube is looped in the distal stomach, tip directed towards the gastric cardia. Electronically Signed   By: Narda Rutherford M.D.   On: 07/24/2022 01:09   CT ABDOMEN PELVIS WO CONTRAST  Result Date: 07/23/2022 CLINICAL DATA:  Acute abdominal pain EXAM: CT ABDOMEN AND PELVIS WITHOUT CONTRAST TECHNIQUE: Multidetector CT imaging of the abdomen and pelvis was performed following the standard protocol without IV contrast. RADIATION DOSE REDUCTION: This exam was performed according to the departmental dose-optimization program which includes automated exposure control, adjustment of the mA and/or kV according to patient size and/or use of iterative reconstruction technique. COMPARISON:  Chest x-ray from earlier in the  same day. FINDINGS: Lower chest: Patchy airspace opacity is noted in the bases right considerably greater than left with a more infiltrative pattern seen than that noted on prior chest x-ray. Hepatobiliary: Cholelithiasis is noted. The liver is within normal limits. Pancreas: Unremarkable. No pancreatic ductal dilatation or surrounding inflammatory changes. Spleen: Normal in size without focal abnormality. Adrenals/Urinary Tract: Adrenal glands are within normal limits. Kidneys are well visualized bilaterally. Tiny punctate stones are noted in the left lower pole. No obstructive changes are seen. The bladder is decompressed. Stomach/Bowel: No obstructive or inflammatory changes of the colon are seen. The appendix is within normal limits. Distal small bowel is decompressed. The stomach is distended with fluid and the proximal small bowel is distended as well. These changes are secondary to an infraumbilical abdominal wall hernia containing both fat and a knuckle of small bowel. This causes significant luminal narrowing at the hernia site and proximal partial small bowel obstruction. Vascular/Lymphatic: Aortic atherosclerosis. No enlarged abdominal or pelvic lymph nodes. Right femoral vein central line is noted. Reproductive: Uterus and bilateral adnexa are unremarkable. Other: No ascites is noted. Musculoskeletal: No acute or significant osseous findings. IMPRESSION: Infraumbilical hernia containing a loop of small bowel with incarceration and proximal obstruction. Surgical consultation is recommended. Bibasilar airspace opacity left greater than right. Tiny nonobstructing left renal calculi. Electronically Signed   By: Alcide Clever M.D.   On: 07/23/2022 22:43   DG Chest Port 1 View  Result Date: 07/23/2022 CLINICAL DATA:  Nausea and vomiting, initial encounter EXAM: PORTABLE CHEST 1 VIEW COMPARISON:  10/09/2008 FINDINGS: Cardiac shadow is stable. Bibasilar atelectatic changes are noted left greater than right. No  sizable effusion is noted. No acute bony abnormality is seen. IMPRESSION: Bibasilar atelectasis left greater than right. Electronically Signed   By: Alcide Clever M.D.   On: 07/23/2022 19:46        Scheduled Meds:  Chlorhexidine Gluconate Cloth  6 each Topical Daily   dorzolamide-timolol  1 drop Left Eye BID   enoxaparin (LOVENOX) injection  40 mg Subcutaneous Q24H   insulin aspart  0-15 Units Subcutaneous TID WC   insulin aspart  0-5 Units Subcutaneous QHS   Continuous Infusions:  ceFEPime (MAXIPIME) IV 1 g (07/24/22 2151)   lactated ringers     lactated ringers 75 mL/hr at 07/25/22 0443   metronidazole 500 mg (07/25/22 0816)     LOS: 1 day    Time spent: 49 minutes spent on  chart review, discussion with nursing staff, consultants, updating family and interview/physical exam; more than 50% of that time was spent in counseling and/or coordination of care.    Alvira Philips [place], DO [facility] Available via Epic secure chat 7am-7pm After these hours, please refer to coverage provider listed on [email or web address] 07/25/2022, 12:02 PM

## 2022-07-26 ENCOUNTER — Inpatient Hospital Stay (HOSPITAL_COMMUNITY): Payer: Medicare Other

## 2022-07-26 DIAGNOSIS — K420 Umbilical hernia with obstruction, without gangrene: Secondary | ICD-10-CM | POA: Diagnosis not present

## 2022-07-26 LAB — GLUCOSE, CAPILLARY
Glucose-Capillary: 170 mg/dL — ABNORMAL HIGH (ref 70–99)
Glucose-Capillary: 216 mg/dL — ABNORMAL HIGH (ref 70–99)
Glucose-Capillary: 91 mg/dL (ref 70–99)
Glucose-Capillary: 93 mg/dL (ref 70–99)
Glucose-Capillary: 96 mg/dL (ref 70–99)

## 2022-07-26 LAB — BASIC METABOLIC PANEL
Anion gap: 8 (ref 5–15)
BUN: 31 mg/dL — ABNORMAL HIGH (ref 8–23)
CO2: 22 mmol/L (ref 22–32)
Calcium: 8.4 mg/dL — ABNORMAL LOW (ref 8.9–10.3)
Chloride: 114 mmol/L — ABNORMAL HIGH (ref 98–111)
Creatinine, Ser: 1.51 mg/dL — ABNORMAL HIGH (ref 0.44–1.00)
GFR, Estimated: 36 mL/min — ABNORMAL LOW (ref >60)
Glucose, Bld: 102 mg/dL — ABNORMAL HIGH (ref 70–99)
Potassium: 3.7 mmol/L (ref 3.5–5.1)
Sodium: 144 mmol/L (ref 135–145)

## 2022-07-26 LAB — CBC
HCT: 29.2 % — ABNORMAL LOW (ref 36.0–46.0)
Hemoglobin: 9.5 g/dL — ABNORMAL LOW (ref 12.0–15.0)
MCH: 29.2 pg (ref 26.0–34.0)
MCHC: 32.5 g/dL (ref 30.0–36.0)
MCV: 89.8 fL (ref 80.0–100.0)
Platelets: 319 10*3/uL (ref 150–400)
RBC: 3.25 MIL/uL — ABNORMAL LOW (ref 3.87–5.11)
RDW: 14 % (ref 11.5–15.5)
WBC: 10.9 10*3/uL — ABNORMAL HIGH (ref 4.0–10.5)
nRBC: 0 % (ref 0.0–0.2)

## 2022-07-26 LAB — LACTIC ACID, PLASMA: Lactic Acid, Venous: 0.9 mmol/L (ref 0.5–1.9)

## 2022-07-26 MED ORDER — SODIUM CHLORIDE 0.9 % IV SOLN
2.0000 g | Freq: Two times a day (BID) | INTRAVENOUS | Status: AC
  Administered 2022-07-26 – 2022-07-29 (×7): 2 g via INTRAVENOUS
  Filled 2022-07-26 (×7): qty 12.5

## 2022-07-26 NOTE — Plan of Care (Signed)
  Problem: Skin Integrity: Goal: Risk for impaired skin integrity will decrease Outcome: Progressing   Problem: Coping: Goal: Level of anxiety will decrease Outcome: Progressing   Problem: Education: Goal: Knowledge of General Education information will improve Description: Including pain rating scale, medication(s)/side effects and non-pharmacologic comfort measures Outcome: Progressing

## 2022-07-26 NOTE — Progress Notes (Signed)
Pharmacy Antibiotic Note  Jocelyn Camacho is a 76 y.o. female admitted on 07/23/2022 presenting with N/V and abdominal pain, concern for IAI.  Pharmacy has been consulted for cefepime dosing; plan to continue for 5 days post-op per Surgery note.  Flagyl per MD.  8/3 s/p dx laparoscopy, exploratory laparotomy, SBR, primary repair of incisional hernia   Renal function has improved, SCr down to 1.51 (baseline 1.4-1.6).   Plan: Change cefepime 2g IV q12h Stop date for 8/8 entered Monitor renal function and need to adjust dose  Height: 5\' 2"  (157.5 cm) Weight: 103.9 kg (229 lb) IBW/kg (Calculated) : 50.1  Temp (24hrs), Avg:99.4 F (37.4 C), Min:98.4 F (36.9 C), Max:100.1 F (37.8 C)  Recent Labs  Lab 07/23/22 1056 07/23/22 1917 07/23/22 2207 07/25/22 0203 07/26/22 0122  WBC 28.9* 28.5*  --  10.9* 10.9*  CREATININE 2.67* 3.76*  --  2.18* 1.51*  LATICACIDVEN  --  3.3* 2.3*  --  0.9     Estimated Creatinine Clearance: 35.8 mL/min (A) (by C-G formula based on SCr of 1.51 mg/dL (H)).    Allergies  Allergen Reactions   Penicillins Swelling   Sulfa Antibiotics Itching and Rash    Thank you for involving pharmacy in this patient's care.  Renold Genta, PharmD, BCPS Clinical Pharmacist Clinical phone for 07/26/2022 until 3p is [phone] 07/26/2022 1:23 PM

## 2022-07-26 NOTE — Progress Notes (Signed)
Patient ID: Jocelyn Camacho, female   DOB: Nov 26, 1946, 75 y.o.   MRN: [id number] 2 Days Post-Op    Subjective: NGT came out when sitting up with PT ROS negative except as listed above. Objective: Vital signs in last 24 hours: Temp:  [98.4 F (36.9 C)-100.1 F (37.8 C)] 99.7 F (37.6 C) (08/05 0747) Pulse Rate:  [95-102] 99 (08/05 1151) Resp:  [18-21] 18 (08/05 1151) BP: (100-123)/(61-64) 123/63 (08/05 1151) SpO2:  [96 %-98 %] 97 % (08/05 1151)    Intake/Output from previous day: 08/04 0701 - 08/05 0700 In: 1349.5 [I.V.:1249.5; IV Piggyback:100] Out: 850 [Emesis/NG output:850] Intake/Output this shift: No intake/output data recorded.  General appearance: alert and cooperative Resp: CTA GI: soft, wound OK, less distended  Lab Results: CBC  Recent Labs    07/25/22 0203 07/26/22 0122  WBC 10.9* 10.9*  HGB 9.5* 9.5*  HCT 28.2* 29.2*  PLT 315 319   BMET Recent Labs    07/25/22 0203 07/26/22 0122  NA 144 144  K 3.7 3.7  CL 108 114*  CO2 25 22  GLUCOSE 142* 102*  BUN 44* 31*  CREATININE 2.18* 1.51*  CALCIUM 8.6* 8.4*   PT/INR Recent Labs    07/23/22 1917  LABPROT 14.2  INR 1.1   ABG No results for input(s): "PHART", "HCO3" in the last 72 hours.  Invalid input(s): "PCO2", "PO2"  Studies/Results: DG Abd 1 View  Result Date: 07/26/2022 CLINICAL DATA:  Evaluate NG tube EXAM: ABDOMEN - 1 VIEW COMPARISON:  None Available. FINDINGS: The NG tube is been pulled back in the interval. However, the side port and distal tip remain below GE junction, within the stomach. A right femoral line is in good position. No other acute abnormalities. IMPRESSION: The NG tube appears to be in good position.  No other abnormalities. Electronically Signed   By: Dorise Bullion III M.D.   On: 07/26/2022 09:11    Anti-infectives: Anti-infectives (From admission, onward)    Start     Dose/Rate Route Frequency Ordered Stop   07/25/22 0500  ciprofloxacin (CIPRO) IVPB 400 mg  Status:   Discontinued        400 mg 200 mL/hr over 60 Minutes Intravenous Every 24 hours 07/24/22 1350 07/24/22 1444   07/24/22 2100  ceFEPIme (MAXIPIME) 1 g in sodium chloride 0.9 % 100 mL IVPB  Status:  Discontinued        1 g 200 mL/hr over 30 Minutes Intravenous Every 24 hours 07/23/22 2029 07/24/22 0658   07/24/22 2100  metroNIDAZOLE (FLAGYL) IVPB 500 mg        500 mg 100 mL/hr over 60 Minutes Intravenous Every 12 hours 07/24/22 0659 07/29/22 2059   07/24/22 2100  ceFEPIme (MAXIPIME) 1 g in sodium chloride 0.9 % 100 mL IVPB        1 g 200 mL/hr over 30 Minutes Intravenous Every 24 hours 07/24/22 0658     07/24/22 2000  ciprofloxacin (CIPRO) IVPB 400 mg  Status:  Discontinued        400 mg 200 mL/hr over 60 Minutes Intravenous Every 12 hours 07/24/22 0659 07/24/22 1350   07/24/22 0315  ciprofloxacin (CIPRO) IVPB 400 mg        400 mg 200 mL/hr over 60 Minutes Intravenous STAT 07/24/22 0311 07/24/22 0441   07/24/22 0315  metroNIDAZOLE (FLAGYL) IVPB 500 mg        500 mg 100 mL/hr over 60 Minutes Intravenous STAT 07/24/22 0311 07/24/22 0457   07/23/22 2000  ceFEPIme (MAXIPIME) 2 g in sodium chloride 0.9 % 100 mL IVPB        2 g 200 mL/hr over 30 Minutes Intravenous  Once 07/23/22 1957 07/23/22 2239   07/23/22 2000  metroNIDAZOLE (FLAGYL) IVPB 500 mg        500 mg 100 mL/hr over 60 Minutes Intravenous  Once 07/23/22 1957 07/23/22 2240       Assessment/Plan: S/P Dx Laparoscopy, exploratory laparotomy, SBR, primary repair of incisional hernia - Dr. Rosendo Gros, 07/24/2022 - Clears - Cont abx 5d post op. Trend labs, follow vitals - BID WTD midline - Mobilize, PT - Pulm toilet   FEN - NPO, NGT, IVF per primary  VTE - SCDs, Lovenox ID - Cefepime/Flagyl  AKI on CKD3b - Cr improving this am @1 .5 ABL anemia  HTN Chronic dCHF DM2 HLD Hypothyroidism OSA on CPAP  LOS: 2 days    Georganna Skeans, MD, MPH, FACS Trauma & General Surgery Use [email or web address] to contact on call provider  07/26/2022

## 2022-07-26 NOTE — Progress Notes (Signed)
Notified by previous shift that patient's NG tube may have been moved. This nurse assessed placement and output. MD made aware and abd x-ray obtained.

## 2022-07-26 NOTE — Progress Notes (Signed)
PROGRESS NOTE    Jocelyn Camacho  QIH:[id number] DOB: [birth year] DOA: 07/23/2022 PCP: Lucky Cowboy, MD    Brief Narrative:   Jocelyn Camacho is a 76 y.o. female with past medical history significant for essential hypertension, type 2 diabetes mellitus, hypothyroidism, hyperlipidemia, chronic diastolic congestive heart failure, CKD stage IIIb, gout, glaucoma, OSA, morbid obesity who presented to [facility] ED on 8/2 with progressive nausea/vomiting and abdominal pain.  Onset a few days prior, she was seen by her PCP and was prescribed oral Zofran and sent back home as thought this was related to gastroenteritis.  Daughter was concerned that there was some thing else going on and patient was sent to med [facility] for further evaluation.  In the ED, temperature 96.6 F, HR 112, BP 104/70, RR 37, SPO2 100% on room air.  WBC 28.9, hemoglobin 13.0, platelets 447.  Sodium 138, potassium 3.8, chloride 96, CO2 24, BUN 51, creatinine 2.67, glucose 167.  AST 17, ALT 16, total bilirubin 1.1.  Lactic acid 3.3.  TSH 0.46.  Hemoglobin A1c 5.2.  Urinalysis with small leukocytes, negative nitrite, rare bacteria, 0-5 WBCs.  CT abdomen/pelvis without contrast with infraumbilical hernia containing a small bowel loop with incarceration and proximal obstruction.  General surgery was consulted.  Patient was transferred to [facility].  Hospitalist concern for further evaluation and management of incarcerated hernia with subsequent bowel obstruction.  Assessment & Plan:   Incarcerated hernia with ischemic bowel  Patient presenting initially to urgent care followed by [facility] with progressive abdominal pain with associated nausea vomiting.  Patient with notable leukocytosis and elevated lactic acid.  Imaging notable for infraumbilical hernia containing a small bowel loop with incarceration of proximal obstruction.  Patient was transferred to [facility] and patient underwent exploratory laparotomy  with small bowel resection and hernia repair by Dr. Derrell Lolling 07/24/2022. --General surgery following, appreciate assistance --WBC 28.9>>10.9 --Lactic acid 3.3>2.3>0.9 --Pathology: Pending --Cefepime and metronidazole; plan 5-day course postoperatively per general surgery -- NG tube removed today, started on clear liquid diet --LR at 75 mL/h --Supportive care, awaiting return of bowel function --Repeat CBC in a.m. with lactic acid --Further per general surgery  Acute renal failure on CKD stage IIIb Patient presenting with a creatinine of 2.67 with peak of 3.76 during hospitalization.  Baseline creatinine 1.4-1.6.  Etiology likely secondary to prerenal azotemia in the setting of nausea and vomiting from bowel obstruction as above. --Cr 2.67>3.76>2.18>1.51 --Continue IV fluid hydration as above --Avoid nephrotoxins, renal dose all medications --BMP daily  Acute postoperative blood loss anemia Hemoglobin 13.0 on admission, postoperatively down to 9.5. --CBC daily --Transfuse for hemoglobin less than 7.0; or active bleeding  Essential hypertension Chronic diastolic congestive heart failure, compensated Home regimen includes atenolol 100 mg p.o. daily furosemide 40 mg p.o. daily, losartan 100 mg p.o. daily, Finerenone 10 mg p.o. daily. --BP 100/62 this morning, well controlled --Continue to hold antihypertensives --Continue monitor BP closely  Type 2 diabetes mellitus Hemoglobin A1c 5.2, well controlled.  Home regimen includes NPH 20 units every morning, 5 units every afternoon, Trulicity 3 mg weekly. --SSI for coverage --CBGs 4 times daily while n.p.o.  Hypothyroidism On levothyroxine 125 mcg p.o. daily at home.  TSH within normal limits. --Holding oral medications given n.p.o. status currently, if remains n.p.o. greater than 7 days will initiate IV levothyroxine  Hyperlipidemia: Holding statin for now  Gout: Holding allopurinol until able to tolerate p.o.  Glaucoma: Continue  eyedrops  OSA --Continue nocturnal CPAP  Weakness/debility/deconditioning: --PT/OT recommending home health on discharge, continue therapy while inpatient  Morbid obesity Body mass index is 41.88 kg/m.  Discussed with patient needs for aggressive lifestyle changes/weight loss as this complicates all facets of care.  Outpatient follow-up with PCP.     DVT prophylaxis: enoxaparin (LOVENOX) injection 40 mg Start: 07/25/22 1000 SCDs Start: 07/24/22 1046    Code Status: Full Code Family Communication: Updated spouse present at bedside this morning  Disposition Plan:  Level of care: Progressive Status is: Inpatient Remains inpatient appropriate because: Awaiting further advancement of diet, general surgery sign off with anticipated discharge home when medically ready    Consultants:  General surgery, Dr. Derrell Lolling  Procedures:  Exploratory laparotomy with small bowel resection and hernia repair, Dr. Derrell Lolling 8/3  Antimicrobials:  Cefepime 8/2>> Metronidazole 8/2>> Ciprofloxacin 8/2 - 8/2   Subjective: Patient seen examined bedside, resting comfortably.  Lying in bed.  Spouse present.  NG tube became dislodged today.  Seen by general surgery and okay to remain out and started on clear liquid diet.  No other questions or concerns at this time.  Denies headache, no dizziness, no chest pain, no palpitations, no shortness of breath, no fever/chills/night sweats, no nausea/vomiting/diarrhea, no focal weakness, no cough/congestion, no paresthesias.  No acute events overnight per nursing staff.  Objective: Vitals:   07/26/22 0020 07/26/22 0603 07/26/22 0747 07/26/22 1151  BP: 111/61 100/62 118/64 123/63  Pulse: 97 (!) 102 (!) 101 99  Resp: 20  18 18   Temp: 99.2 F (37.3 C) 100.1 F (37.8 C) 99.7 F (37.6 C)   TempSrc: Oral Oral Oral   SpO2: 96% 96% 97% 97%  Weight:      Height:        Intake/Output Summary (Last 24 hours) at 07/26/2022 1447 Last data filed at 07/26/2022  1312 Gross per 24 hour  Intake 1873.21 ml  Output 550 ml  Net 1323.21 ml   Filed Weights   07/23/22 2122  Weight: 103.9 kg    Examination:  Physical Exam: GEN: NAD, alert and oriented x 3, obese HEENT: NCAT, PERRL, EOMI, sclera clear, MMM PULM: CTAB w/o wheezes/crackles, normal respiratory effort CV: RRR w/o M/G/R GI: abd soft, nondistended, mild tenderness to palpation surrounding surgical site, no appreciable bowel sounds, surgical dressing in place, clean/dry/intact MSK: no peripheral edema, muscle strength globally intact 5/5 bilateral upper/lower extremities, noted right groin central line in place NEURO: CN II-XII intact, no focal deficits, sensation to light touch intact PSYCH: normal mood/affect Integumentary: dry/intact, no rashes or wounds    Data Reviewed: I have personally reviewed following labs and imaging studies  CBC: Recent Labs  Lab 07/23/22 1056 07/23/22 1917 07/25/22 0203 07/26/22 0122  WBC 28.9* 28.5* 10.9* 10.9*  NEUTROABS 25,345* 24.3*  --   --   HGB 13.0 13.8 9.5* 9.5*  HCT 39.4 42.1 28.2* 29.2*  MCV 87.8 86.8 87.6 89.8  PLT 447* 488* 315 319   Basic Metabolic Panel: Recent Labs  Lab 07/23/22 1056 07/23/22 1917 07/25/22 0203 07/26/22 0122  NA 138 139 144 144  K 3.8 4.2 3.7 3.7  CL 96* 95* 108 114*  CO2 24 24 25 22   GLUCOSE 167* 210* 142* 102*  BUN 51* 62* 44* 31*  CREATININE 2.67* 3.76* 2.18* 1.51*  CALCIUM 9.8 10.4* 8.6* 8.4*  MG  --   --  2.3  --   PHOS  --   --  3.2  --    GFR: Estimated Creatinine Clearance: 35.8 mL/min (A) (  by C-G formula based on SCr of 1.51 mg/dL (H)). Liver Function Tests: Recent Labs  Lab 07/23/22 1056 07/23/22 1917  AST 17 28  ALT 16 19  ALKPHOS  --  95  BILITOT 1.1 1.2  PROT 7.9 8.2*  ALBUMIN  --  4.5   No results for input(s): "LIPASE", "AMYLASE" in the last 168 hours. No results for input(s): "AMMONIA" in the last 168 hours. Coagulation Profile: Recent Labs  Lab 07/23/22 1917  INR 1.1    Cardiac Enzymes: No results for input(s): "CKTOTAL", "CKMB", "CKMBINDEX", "TROPONINI" in the last 168 hours. BNP (last 3 results) No results for input(s): "PROBNP" in the last 8760 hours. HbA1C: No results for input(s): "HGBA1C" in the last 72 hours.  CBG: Recent Labs  Lab 07/25/22 1122 07/25/22 2028 07/26/22 0016 07/26/22 0750 07/26/22 1148  GLUCAP 121* 90 93 91 96   Lipid Profile: No results for input(s): "CHOL", "HDL", "LDLCALC", "TRIG", "CHOLHDL", "LDLDIRECT" in the last 72 hours.  Thyroid Function Tests: No results for input(s): "TSH", "T4TOTAL", "FREET4", "T3FREE", "THYROIDAB" in the last 72 hours.  Anemia Panel: No results for input(s): "VITAMINB12", "FOLATE", "FERRITIN", "TIBC", "IRON", "RETICCTPCT" in the last 72 hours. Sepsis Labs: Recent Labs  Lab 07/23/22 1917 07/23/22 2207 07/26/22 0122  LATICACIDVEN 3.3* 2.3* 0.9    Recent Results (from the past 240 hour(s))  Culture, blood (Routine x 2)     Status: None (Preliminary result)   Collection Time: 07/23/22  7:00 PM   Specimen: BLOOD  Result Value Ref Range Status   Specimen Description   Final    BLOOD RIGHT Performed at [facility], [address] [place], [place] [address]    Special Requests   Final    BOTTLES DRAWN AEROBIC AND ANAEROBIC Blood Culture adequate volume Performed at [facility], [address] [place], [place] [address]    Culture   Final    NO GROWTH 2 DAYS Performed at [facility], [address] [place], [place] [address]    Report Status PENDING  Incomplete  Culture, blood (Routine x 2)     Status: None (Preliminary result)   Collection Time: 07/23/22  7:17 PM   Specimen: BLOOD  Result Value Ref Range Status   Specimen Description   Final    BLOOD RIGHT ANTECUBITAL Performed at [facility], [address] [place], [place] [address]    Special Requests   Final    Blood Culture adequate volume BOTTLES  DRAWN AEROBIC AND ANAEROBIC Performed at [facility], [address] [place], [place] [address]    Culture   Final    NO GROWTH 3 DAYS Performed at [facility], [address] [place], [place] [address]    Report Status PENDING  Incomplete  Urine Culture     Status: Abnormal (Preliminary result)   Collection Time: 07/24/22 12:01 AM   Specimen: In/Out Cath Urine  Result Value Ref Range Status   Specimen Description   Final    IN/OUT CATH URINE Performed at [facility], [address] [place], [place] [address]    Special Requests   Final    NONE Performed at [facility], [address] [place], [place] [address]    Culture (A)  Final    1,000 COLONIES/mL ESCHERICHIA COLI SUSCEPTIBILITIES TO FOLLOW Performed at [facility], [address] [place], [place] [address]    Report Status PENDING  Incomplete  Radiology Studies: DG Abd 1 View  Result Date: 07/26/2022 CLINICAL DATA:  Evaluate NG tube EXAM: ABDOMEN - 1 VIEW COMPARISON:  None Available. FINDINGS: The NG tube is been pulled back in the interval. However, the side port and distal tip remain below GE junction, within the stomach. A right femoral line is in good position. No other acute abnormalities. IMPRESSION: The NG tube appears to be in good position.  No other abnormalities. Electronically Signed   By: Gerome Sam III M.D.   On: 07/26/2022 09:11        Scheduled Meds:  Chlorhexidine Gluconate Cloth  6 each Topical Daily   dorzolamide-timolol  1 drop Left Eye BID   enoxaparin (LOVENOX) injection  40 mg Subcutaneous Q24H   insulin aspart  0-15 Units Subcutaneous TID WC   insulin aspart  0-5 Units Subcutaneous QHS   Continuous Infusions:  ceFEPime (MAXIPIME) IV 2 g (07/26/22 1444)   lactated ringers     lactated ringers 75 mL/hr at 07/26/22 1310   metronidazole 500 mg (07/26/22 0945)     LOS: 2 days    Time spent:  49 minutes spent on chart review, discussion with nursing staff, consultants, updating family and interview/physical exam; more than 50% of that time was spent in counseling and/or coordination of care.    Alvira Philips [place], DO [facility] Available via Epic secure chat 7am-7pm After these hours, please refer to coverage provider listed on [email or web address] 07/26/2022, 2:47 PM

## 2022-07-26 NOTE — Evaluation (Signed)
Occupational Therapy Evaluation Patient Details Name: Jocelyn Camacho MRN: [id number] DOB: [birth year] Today's Date: 07/26/2022   History of Present Illness Patient is a 76 y/o female who presents to [facility] on 07/23/22 due to abdominal pain, N/V. Found to have SBO and incarcerated hernia, transferred to [facility] and now s/p exp laparatomy, small bowel resection and hernia repair 07/24/22. PMH includes HTN, anxiety, DM, OSA, obesity.   Clinical Impression   Patient admitted for the diagnosis above.  PTA she lives at home with her spouse, and needed no assist with ADL, iADL or mobility.  Generalized fatigue and weakness are the deficits.  Currently she is needing up to [place] for basic mobility and Mod A for lower body ADL,  patient should be able to progress to a home with [employer] level.  OT will follow in the acute setting.        Recommendations for follow up therapy are one component of a multi-disciplinary discharge planning process, led by the attending physician.  Recommendations may be updated based on patient status, additional functional criteria and insurance authorization.   Follow Up Recommendations  Home health OT    Assistance Recommended at Discharge Intermittent Supervision/Assistance  Patient can return home with the following A little help with walking and/or transfers;A little help with bathing/dressing/bathroom;Assistance with cooking/housework;Assist for transportation;Help with stairs or ramp for entrance    Functional Status Assessment  Patient has had a recent decline in their functional status and demonstrates the ability to make significant improvements in function in a reasonable and predictable amount of time.  Equipment Recommendations  None recommended by OT    Recommendations for Other Services       Precautions / Restrictions Precautions Precautions: Fall;Other (comment) Precaution Comments: NGT to suction Restrictions Weight Bearing Restrictions: No       Mobility Bed Mobility Overal bed mobility: Needs Assistance Bed Mobility: Sidelying to Sit, Sit to Sidelying   Sidelying to sit: Min assist     Sit to sidelying: Min assist      Transfers Overall transfer level: Needs assistance Equipment used: Rolling walker (2 wheels) Transfers: Sit to/from Stand Sit to Stand: Min assist                  Balance Overall balance assessment: Needs assistance Sitting-balance support: Feet supported, No upper extremity supported Sitting balance-Leahy Scale: Good     Standing balance support: Reliant on assistive device for balance Standing balance-Leahy Scale: Poor                             ADL either performed or assessed with clinical judgement   ADL       Grooming: Wash/dry hands;Supervision/safety;Standing               Lower Body Dressing: Moderate assistance;Sit to/from stand   Toilet Transfer: Min guard;Ambulation;Rolling walker (2 wheels)                   Vision Patient Visual Report: No change from baseline       Perception Perception Perception: Within Functional Limits   Praxis Praxis Praxis: Intact    Pertinent Vitals/Pain Pain Assessment Pain Assessment: Faces Faces Pain Scale: Hurts a little bit Pain Location: abdomen, NGT when touched Pain Descriptors / Indicators: Discomfort Pain Intervention(s): Monitored during session     Hand Dominance Right   Extremity/Trunk Assessment Upper Extremity Assessment Upper Extremity Assessment: Overall WFL for tasks  assessed   Lower Extremity Assessment Lower Extremity Assessment: Defer to PT evaluation       Communication Communication Communication: No difficulties   Cognition Arousal/Alertness: Awake/alert Behavior During Therapy: Flat affect Overall Cognitive Status: Within Functional Limits for tasks assessed                                       General Comments   VSS on O2    Exercises      Shoulder Instructions      Home Living Family/patient expects to be discharged to:: Private residence Living Arrangements: Spouse/significant other Available Help at Discharge: Family;Available 24 hours/day Type of Home: House Home Access: Level entry     Home Layout: One level     Bathroom Shower/Tub: Teacher, early years/pre: Standard     Home Equipment: None          Prior Functioning/Environment Prior Level of Function : Independent/Modified Independent             Mobility Comments: Drives, does IADLs ADLs Comments: No assist PTA        OT Problem List: Decreased strength;Decreased activity tolerance;Impaired balance (sitting and/or standing)      OT Treatment/Interventions: Self-care/ADL training;Balance training;Patient/family education;Therapeutic activities    OT Goals(Current goals can be found in the care plan section) Acute Rehab OT Goals Patient Stated Goal: Return home OT Goal Formulation: With patient Time For Goal Achievement: 08/08/22 Potential to Achieve Goals: Good ADL Goals Pt Will Perform Grooming: with modified independence;standing Pt Will Perform Lower Body Dressing: with modified independence;sit to/from stand Pt Will Transfer to Toilet: with modified independence;ambulating;regular height toilet  OT Frequency: Min 2X/week    Co-evaluation              AM-PAC OT "6 Clicks" Daily Activity     Outcome Measure Help from another person eating meals?: None Help from another person taking care of personal grooming?: A Little Help from another person toileting, which includes using toliet, bedpan, or urinal?: A Little Help from another person bathing (including washing, rinsing, drying)?: A Lot Help from another person to put on and taking off regular upper body clothing?: None Help from another person to put on and taking off regular lower body clothing?: A Lot 6 Click Score: 18   End of Session Equipment Utilized  During Treatment: Rolling walker (2 wheels)  Activity Tolerance: Patient tolerated treatment well Patient left: in bed;with call bell/phone within reach;with family/visitor present  OT Visit Diagnosis: Unsteadiness on feet (R26.81);Muscle weakness (generalized) (M62.81)                Time: 9381-0175 OT Time Calculation (min): 25 min Charges:  OT General Charges $OT Visit: 1 Visit OT Evaluation $OT Eval Moderate Complexity: 1 Mod OT Treatments $Self Care/Home Management : 8-22 mins  07/26/2022  RP, OTR/L  Acute Rehabilitation Services  Office:  [place] 07/26/2022, 11:20 AM

## 2022-07-26 NOTE — Progress Notes (Signed)
While patient was sitting on side of bed with this nurse and OT, NG tube was accidentally removed. Kathlee Nations, [place] and Dr. [place] made aware at 1109. Dr. Grandville Silos in the room approximately 1145 and gave this nurse verbal order to leave out. Clear liquid diet ordered. Patient and husband agreeable to plan of care.

## 2022-07-26 NOTE — Progress Notes (Signed)
Pt unable to wear cpap due to NG tube

## 2022-07-27 DIAGNOSIS — K420 Umbilical hernia with obstruction, without gangrene: Secondary | ICD-10-CM | POA: Diagnosis not present

## 2022-07-27 LAB — BASIC METABOLIC PANEL
Anion gap: 8 (ref 5–15)
BUN: 21 mg/dL (ref 8–23)
CO2: 22 mmol/L (ref 22–32)
Calcium: 8 mg/dL — ABNORMAL LOW (ref 8.9–10.3)
Chloride: 110 mmol/L (ref 98–111)
Creatinine, Ser: 1.23 mg/dL — ABNORMAL HIGH (ref 0.44–1.00)
GFR, Estimated: 46 mL/min — ABNORMAL LOW (ref >60)
Glucose, Bld: 144 mg/dL — ABNORMAL HIGH (ref 70–99)
Potassium: 3.9 mmol/L (ref 3.5–5.1)
Sodium: 140 mmol/L (ref 135–145)

## 2022-07-27 LAB — CBC
HCT: 28.6 % — ABNORMAL LOW (ref 36.0–46.0)
Hemoglobin: 9.3 g/dL — ABNORMAL LOW (ref 12.0–15.0)
MCH: 29 pg (ref 26.0–34.0)
MCHC: 32.5 g/dL (ref 30.0–36.0)
MCV: 89.1 fL (ref 80.0–100.0)
Platelets: 334 10*3/uL (ref 150–400)
RBC: 3.21 MIL/uL — ABNORMAL LOW (ref 3.87–5.11)
RDW: 14.1 % (ref 11.5–15.5)
WBC: 15.2 10*3/uL — ABNORMAL HIGH (ref 4.0–10.5)
nRBC: 0.1 % (ref 0.0–0.2)

## 2022-07-27 LAB — URINE CULTURE: Culture: 1000 — AB

## 2022-07-27 LAB — MAGNESIUM: Magnesium: 2.1 mg/dL (ref 1.7–2.4)

## 2022-07-27 LAB — GLUCOSE, CAPILLARY
Glucose-Capillary: 130 mg/dL — ABNORMAL HIGH (ref 70–99)
Glucose-Capillary: 134 mg/dL — ABNORMAL HIGH (ref 70–99)
Glucose-Capillary: 153 mg/dL — ABNORMAL HIGH (ref 70–99)
Glucose-Capillary: 132 mg/dL — ABNORMAL HIGH (ref 70–99)

## 2022-07-27 NOTE — Plan of Care (Signed)
?  Problem: Education: ?Goal: Knowledge of General Education information will improve ?Description: Including pain rating scale, medication(s)/side effects and non-pharmacologic comfort measures ?Outcome: Progressing ?  ?Problem: Health Behavior/Discharge Planning: ?Goal: Ability to manage health-related needs will improve ?Outcome: Progressing ?  ?Problem: Coping: ?Goal: Level of anxiety will decrease ?Outcome: Progressing ?  ?

## 2022-07-27 NOTE — Progress Notes (Signed)
3 Days Post-Op   Subjective/Chief Complaint: Taking clear, no bowel function, not oob much   Objective: Vital signs in last 24 hours: Temp:  [97.6 F (36.4 C)-99.2 F (37.3 C)] 98.4 F (36.9 C) (08/06 0558) Pulse Rate:  [84-99] 84 (08/06 0558) Resp:  [18-29] 22 (08/06 0558) BP: (106-126)/(56-73) 126/73 (08/06 0558) SpO2:  [95 %-99 %] 97 % (08/06 0558) FiO2 (%):  [21 %] 21 % (08/05 2151)    Intake/Output from previous day: 08/05 0701 - 08/06 0700 In: 2532.3 [P.O.:1160; I.V.:872.3; IV Piggyback:500] Out: -  Intake/Output this shift: Total I/O In: 100 [IV Piggyback:100] Out: -   Abd wound clean, soft approp tender  Lab Results:  Recent Labs    07/26/22 0122 07/27/22 0344  WBC 10.9* 15.2*  HGB 9.5* 9.3*  HCT 29.2* 28.6*  PLT 319 334   BMET Recent Labs    07/26/22 0122 07/27/22 0139  NA 144 140  K 3.7 3.9  CL 114* 110  CO2 22 22  GLUCOSE 102* 144*  BUN 31* 21  CREATININE 1.51* 1.23*  CALCIUM 8.4* 8.0*   PT/INR No results for input(s): "LABPROT", "INR" in the last 72 hours. ABG No results for input(s): "PHART", "HCO3" in the last 72 hours.  Invalid input(s): "PCO2", "PO2"  Studies/Results: DG Abd 1 View  Result Date: 07/26/2022 CLINICAL DATA:  Evaluate NG tube EXAM: ABDOMEN - 1 VIEW COMPARISON:  None Available. FINDINGS: The NG tube is been pulled back in the interval. However, the side port and distal tip remain below GE junction, within the stomach. A right femoral line is in good position. No other acute abnormalities. IMPRESSION: The NG tube appears to be in good position.  No other abnormalities. Electronically Signed   By: Dorise Bullion III M.D.   On: 07/26/2022 09:11    Anti-infectives: Anti-infectives (From admission, onward)    Start     Dose/Rate Route Frequency Ordered Stop   07/26/22 1400  ceFEPIme (MAXIPIME) 2 g in sodium chloride 0.9 % 100 mL IVPB        2 g 200 mL/hr over 30 Minutes Intravenous Every 12 hours 07/26/22 1322 07/29/22  2359   07/25/22 0500  ciprofloxacin (CIPRO) IVPB 400 mg  Status:  Discontinued        400 mg 200 mL/hr over 60 Minutes Intravenous Every 24 hours 07/24/22 1350 07/24/22 1444   07/24/22 2100  ceFEPIme (MAXIPIME) 1 g in sodium chloride 0.9 % 100 mL IVPB  Status:  Discontinued        1 g 200 mL/hr over 30 Minutes Intravenous Every 24 hours 07/23/22 2029 07/24/22 0658   07/24/22 2100  metroNIDAZOLE (FLAGYL) IVPB 500 mg        500 mg 100 mL/hr over 60 Minutes Intravenous Every 12 hours 07/24/22 0659 07/29/22 2059   07/24/22 2100  ceFEPIme (MAXIPIME) 1 g in sodium chloride 0.9 % 100 mL IVPB  Status:  Discontinued        1 g 200 mL/hr over 30 Minutes Intravenous Every 24 hours 07/24/22 0658 07/26/22 1322   07/24/22 2000  ciprofloxacin (CIPRO) IVPB 400 mg  Status:  Discontinued        400 mg 200 mL/hr over 60 Minutes Intravenous Every 12 hours 07/24/22 0659 07/24/22 1350   07/24/22 0315  ciprofloxacin (CIPRO) IVPB 400 mg        400 mg 200 mL/hr over 60 Minutes Intravenous STAT 07/24/22 0311 07/24/22 0441   07/24/22 0315  metroNIDAZOLE (FLAGYL) IVPB 500  mg        500 mg 100 mL/hr over 60 Minutes Intravenous STAT 07/24/22 0311 07/24/22 0457   07/23/22 2000  ceFEPIme (MAXIPIME) 2 g in sodium chloride 0.9 % 100 mL IVPB        2 g 200 mL/hr over 30 Minutes Intravenous  Once 07/23/22 1957 07/23/22 2239   07/23/22 2000  metroNIDAZOLE (FLAGYL) IVPB 500 mg        500 mg 100 mL/hr over 60 Minutes Intravenous  Once 07/23/22 1957 07/23/22 2240       Assessment/Plan: POD 3 Dx Laparoscopy, exploratory laparotomy, SBR, primary repair of incisional hernia - Dr. Rosendo Gros, 07/24/2022 - Clears today until bowel function, npo if any n/v - Cont abx 5d post op. Trend labs, follow vitals - BID WTD midline - Mobilize, PT-she needs to be oob much more - Pulm toilet  -follow wbc   FEN - NPO,IVF per primary  VTE - SCDs, Lovenox ID - Cefepime/Flagyl   AKI on CKD3b - Cr improving this am @1 .23 ABL anemia   HTN Chronic dCHF DM2 HLD Hypothyroidism OSA on CPAP  Rolm Bookbinder 07/27/2022

## 2022-07-27 NOTE — Progress Notes (Signed)
PROGRESS NOTE    Jocelyn Camacho  VQQ:[id number] DOB: 1946/10/24 DOA: 07/23/2022 PCP: Lucky Cowboy, MD    Brief Narrative:   Jocelyn Camacho is a 76 y.o. female with past medical history significant for essential hypertension, type 2 diabetes mellitus, hypothyroidism, hyperlipidemia, chronic diastolic congestive heart failure, CKD stage IIIb, gout, glaucoma, OSA, morbid obesity who presented to [facility] ED on 8/2 with progressive nausea/vomiting and abdominal pain.  Onset a few days prior, she was seen by her PCP and was prescribed oral Zofran and sent back home as thought this was related to gastroenteritis.  Daughter was concerned that there was some thing else going on and patient was sent to med [facility] for further evaluation.  In the ED, temperature 96.6 F, HR 112, BP 104/70, RR 37, SPO2 100% on room air.  WBC 28.9, hemoglobin 13.0, platelets 447.  Sodium 138, potassium 3.8, chloride 96, CO2 24, BUN 51, creatinine 2.67, glucose 167.  AST 17, ALT 16, total bilirubin 1.1.  Lactic acid 3.3.  TSH 0.46.  Hemoglobin A1c 5.2.  Urinalysis with small leukocytes, negative nitrite, rare bacteria, 0-5 WBCs.  CT abdomen/pelvis without contrast with infraumbilical hernia containing a small bowel loop with incarceration and proximal obstruction.  General surgery was consulted.  Patient was transferred to [facility].  Hospitalist concern for further evaluation and management of incarcerated hernia with subsequent bowel obstruction.  Assessment & Plan:   Incarcerated hernia with ischemic bowel  Patient presenting initially to urgent care followed by [facility] with progressive abdominal pain with associated nausea vomiting.  Patient with notable leukocytosis and elevated lactic acid.  Imaging notable for infraumbilical hernia containing a small bowel loop with incarceration of proximal obstruction.  Patient was transferred to [facility] and patient underwent exploratory laparotomy  with small bowel resection and hernia repair by Dr. Derrell Lolling 07/24/2022.  Pathology with ischemic necrosis transmural, mesenteric venous thrombi no edematous change or malignancy identified --General surgery following, appreciate assistance --WBC 28.9>>10.9>15.2 --Lactic acid 3.3>2.3>0.9 --Cefepime and metronidazole; plan 5-day course postoperatively per general surgery -- Clear liquid diet --LR at 75 mL/h --Supportive care, awaiting return of bowel function --CBC daily --Further per general surgery  Acute renal failure on CKD stage IIIb Patient presenting with a creatinine of 2.67 with peak of 3.76 during hospitalization.  Baseline creatinine 1.4-1.6.  Etiology likely secondary to prerenal azotemia in the setting of nausea and vomiting from bowel obstruction as above. --Cr 2.67>3.76>2.18>1.51>1.23 --Continue IV fluid hydration as above --Avoid nephrotoxins, renal dose all medications --BMP daily  Acute postoperative blood loss anemia Hemoglobin 13.0 on admission, postoperatively down to 9.5>9.3; stable.. --CBC daily --Transfuse for hemoglobin less than 7.0; or active bleeding  Essential hypertension Chronic diastolic congestive heart failure, compensated Home regimen includes atenolol 100 mg p.o. daily furosemide 40 mg p.o. daily, losartan 100 mg p.o. daily, Finerenone 10 mg p.o. daily. --BP 126/73 this morning, well controlled --Continue to hold antihypertensives --Continue monitor BP closely  Type 2 diabetes mellitus Hemoglobin A1c 5.2, well controlled.  Home regimen includes NPH 20 units every morning, 5 units every afternoon, Trulicity 3 mg weekly. --SSI for coverage --CBGs TIDAC  Hypothyroidism On levothyroxine 125 mcg p.o. daily at home.  TSH within normal limits. --Holding oral medications until further return of bowel function, if greater than 7 days will initiate IV levothyroxine  Hyperlipidemia: Holding statin for now  Gout: Holding allopurinol until able to tolerate  p.o.  Glaucoma: Continue eyedrops  OSA --Continue nocturnal CPAP  Weakness/debility/deconditioning: --PT/OT recommending home health on discharge, continue therapy while inpatient  Morbid obesity Body mass index is 41.88 kg/m.  Discussed with patient needs for aggressive lifestyle changes/weight loss as this complicates all facets of care.  Outpatient follow-up with PCP.     DVT prophylaxis: enoxaparin (LOVENOX) injection 40 mg Start: 07/25/22 1000 SCDs Start: 07/24/22 1046    Code Status: Full Code Family Communication: No family present at bedside this morning  Disposition Plan:  Level of care: Progressive Status is: Inpatient Remains inpatient appropriate because: Awaiting further advancement of diet, general surgery sign off with anticipated discharge home when medically ready    Consultants:  General surgery, Dr. Derrell Lolling  Procedures:  Exploratory laparotomy with small bowel resection and hernia repair, Dr. Derrell Lolling 8/3  Antimicrobials:  Cefepime 8/2>> Metronidazole 8/2>> Ciprofloxacin 8/2 - 8/2   Subjective: Patient seen examined bedside, resting comfortably.  Lying in bed.  Denies flatus.  Tolerating clear liquid diet without nausea/vomiting.  Needs to mobilize further.  No other questions or concerns at this time.  Denies headache, no dizziness, no chest pain, no palpitations, no shortness of breath, no fever/chills/night sweats, no nausea/vomiting/diarrhea, no focal weakness, no cough/congestion, no paresthesias.  No acute events overnight per nursing staff.  Objective: Vitals:   07/27/22 0000 07/27/22 0218 07/27/22 0412 07/27/22 0558  BP: 115/72 106/66 111/64 126/73  Pulse: 89 89 91 84  Resp: (!) 26 (!) 28 (!) 29 (!) 22  Temp: 97.6 F (36.4 C) 99.2 F (37.3 C) 98.8 F (37.1 C) 98.4 F (36.9 C)  TempSrc: Oral Axillary Oral Axillary  SpO2: 95% 95% 98% 97%  Weight:      Height:        Intake/Output Summary (Last 24 hours) at 07/27/2022 1303 Last data  filed at 07/27/2022 0730 Gross per 24 hour  Intake 2432.31 ml  Output --  Net 2432.31 ml   Filed Weights   07/23/22 2122  Weight: 103.9 kg    Examination:  Physical Exam: GEN: NAD, alert and oriented x 3, obese HEENT: NCAT, PERRL, EOMI, sclera clear, MMM PULM: CTAB w/o wheezes/crackles, normal respiratory effort CV: RRR w/o M/G/R GI: abd soft, nondistended, mild tenderness to palpation surrounding surgical site, no appreciable bowel sounds, surgical dressing in place, clean/dry/intact MSK: no peripheral edema, muscle strength globally intact 5/5 bilateral upper/lower extremities, noted right groin central line in place NEURO: CN II-XII intact, no focal deficits, sensation to light touch intact PSYCH: normal mood/affect Integumentary: dry/intact, no rashes or wounds    Data Reviewed: I have personally reviewed following labs and imaging studies  CBC: Recent Labs  Lab 07/23/22 1056 07/23/22 1917 07/25/22 0203 07/26/22 0122 07/27/22 0344  WBC 28.9* 28.5* 10.9* 10.9* 15.2*  NEUTROABS 25,345* 24.3*  --   --   --   HGB 13.0 13.8 9.5* 9.5* 9.3*  HCT 39.4 42.1 28.2* 29.2* 28.6*  MCV 87.8 86.8 87.6 89.8 89.1  PLT 447* 488* 315 319 334   Basic Metabolic Panel: Recent Labs  Lab 07/23/22 1056 07/23/22 1917 07/25/22 0203 07/26/22 0122 07/27/22 0139  NA 138 139 144 144 140  K 3.8 4.2 3.7 3.7 3.9  CL 96* 95* 108 114* 110  CO2 24 24 25 22 22   GLUCOSE 167* 210* 142* 102* 144*  BUN 51* 62* 44* 31* 21  CREATININE 2.67* 3.76* 2.18* 1.51* 1.23*  CALCIUM 9.8 10.4* 8.6* 8.4* 8.0*  MG  --   --  2.3  --  2.1  PHOS  --   --  3.2  --   --    GFR: Estimated Creatinine Clearance: 44 mL/min (A) (by C-G formula based on SCr of 1.23 mg/dL (H)). Liver Function Tests: Recent Labs  Lab 07/23/22 1056 07/23/22 1917  AST 17 28  ALT 16 19  ALKPHOS  --  95  BILITOT 1.1 1.2  PROT 7.9 8.2*  ALBUMIN  --  4.5   No results for input(s): "LIPASE", "AMYLASE" in the last 168 hours. No  results for input(s): "AMMONIA" in the last 168 hours. Coagulation Profile: Recent Labs  Lab 07/23/22 1917  INR 1.1   Cardiac Enzymes: No results for input(s): "CKTOTAL", "CKMB", "CKMBINDEX", "TROPONINI" in the last 168 hours. BNP (last 3 results) No results for input(s): "PROBNP" in the last 8760 hours. HbA1C: No results for input(s): "HGBA1C" in the last 72 hours.  CBG: Recent Labs  Lab 07/26/22 1148 07/26/22 1637 07/26/22 2018 07/27/22 0845 07/27/22 1141  GLUCAP 96 216* 170* 130* 134*   Lipid Profile: No results for input(s): "CHOL", "HDL", "LDLCALC", "TRIG", "CHOLHDL", "LDLDIRECT" in the last 72 hours.  Thyroid Function Tests: No results for input(s): "TSH", "T4TOTAL", "FREET4", "T3FREE", "THYROIDAB" in the last 72 hours.  Anemia Panel: No results for input(s): "VITAMINB12", "FOLATE", "FERRITIN", "TIBC", "IRON", "RETICCTPCT" in the last 72 hours. Sepsis Labs: Recent Labs  Lab 07/23/22 1917 07/23/22 2207 07/26/22 0122  LATICACIDVEN 3.3* 2.3* 0.9    Recent Results (from the past 240 hour(s))  Culture, blood (Routine x 2)     Status: None (Preliminary result)   Collection Time: 07/23/22  7:00 PM   Specimen: BLOOD  Result Value Ref Range Status   Specimen Description   Final    BLOOD RIGHT Performed at [facility], [address], [place], [place] [address]    Special Requests   Final    BOTTLES DRAWN AEROBIC AND ANAEROBIC Blood Culture adequate volume Performed at [facility], [address], [place], [place] [address]    Culture   Final    NO GROWTH 3 DAYS Performed at [facility], [address] [place], [place] [address]    Report Status PENDING  Incomplete  Culture, blood (Routine x 2)     Status: None (Preliminary result)   Collection Time: 07/23/22  7:17 PM   Specimen: BLOOD  Result Value Ref Range Status   Specimen Description   Final    BLOOD RIGHT ANTECUBITAL Performed at [facility], [address] [place], [place] [address]    Special Requests   Final    Blood Culture adequate volume BOTTLES DRAWN AEROBIC AND ANAEROBIC Performed at [facility], [address] [place], [place] [address]    Culture   Final    NO GROWTH 4 DAYS Performed at [facility], [address], [place], [place] [address]    Report Status PENDING  Incomplete  Urine Culture     Status: Abnormal   Collection Time: 07/24/22 12:01 AM   Specimen: In/Out Cath Urine  Result Value Ref Range Status   Specimen Description   Final    IN/OUT CATH URINE Performed at [facility], [address] [place], [place] [address]    Special Requests   Final    NONE Performed at [facility], [address], [place], [place] [address]    Culture 1,000 COLONIES/mL ESCHERICHIA COLI (A)  Final   Report Status 07/27/2022 FINAL  Final   Organism ID, Bacteria ESCHERICHIA COLI (A)  Final      Susceptibility   Escherichia coli - MIC*    AMPICILLIN 8 SENSITIVE Sensitive     CEFAZOLIN <=4 SENSITIVE Sensitive     CEFEPIME <=0.12 SENSITIVE Sensitive     CEFTRIAXONE <=0.25 SENSITIVE Sensitive     CIPROFLOXACIN <=0.25 SENSITIVE Sensitive     GENTAMICIN <=1 SENSITIVE Sensitive     IMIPENEM <=0.25 SENSITIVE Sensitive     NITROFURANTOIN <=16 SENSITIVE Sensitive     TRIMETH/SULFA >=320 RESISTANT Resistant     AMPICILLIN/SULBACTAM <=2 SENSITIVE Sensitive     PIP/TAZO <=4 SENSITIVE Sensitive     * 1,000 COLONIES/mL ESCHERICHIA COLI         Radiology Studies: DG Abd 1 View  Result Date: 07/26/2022 CLINICAL DATA:  Evaluate NG tube EXAM: ABDOMEN - 1 VIEW COMPARISON:  None Available. FINDINGS: The NG tube is been pulled back in the interval. However, the side port and distal tip remain below GE junction, within the stomach. A right femoral line is in good position. No other acute abnormalities. IMPRESSION: The NG tube appears to be in good  position.  No other abnormalities. Electronically Signed   By: Gerome Sam III M.D.   On: 07/26/2022 09:11        Scheduled Meds:  dorzolamide-timolol  1 drop Left Eye BID   enoxaparin (LOVENOX) injection  40 mg Subcutaneous Q24H   insulin aspart  0-15 Units Subcutaneous TID WC   insulin aspart  0-5 Units Subcutaneous QHS   Continuous Infusions:  ceFEPime (MAXIPIME) IV 2 g (07/27/22 0217)   lactated ringers 75 mL/hr at 07/27/22 0944   metronidazole 500 mg (07/27/22 0949)     LOS: 3 days    Time spent: 49 minutes spent on chart review, discussion with nursing staff, consultants, updating family and interview/physical exam; more than 50% of that time was spent in counseling and/or coordination of care.    Alvira Philips [place], DO [facility] Available via Epic secure chat 7am-7pm After these hours, please refer to coverage provider listed on [email or web address] 07/27/2022, 1:03 PM

## 2022-07-27 NOTE — TOC Initial Note (Signed)
Transition of Care [facility]) - Initial/Assessment Note    Patient Details  Name: Jocelyn Camacho MRN: [id number] Date of Birth: 11/09/1946  Transition of Care [facility]) CM/SW Contact:    Bartholomew Crews, RN Phone Number: [phone] 07/27/2022, 12:14 PM  Clinical Narrative:                  Spoke with patient and her sister at the bedside to discuss post acute transition. PTA home with spouse. Independent. Drives. Occasional use of case. States that she and her husband take care of each other. Discussed recommendations for [facility] - patient and family agreeable. Referral accepted by [facility] pending insurance authorization. Patient will need HH Face to Face orders for RN, PT, OT. TOC following for transition needs.   Expected Discharge Plan: [place] Barriers to Discharge: Continued Medical Work up   Patient Goals and CMS Choice Patient states their goals for this hospitalization and ongoing recovery are:: return home with husband and family support CMS Medicare.gov Compare Post Acute Care list provided to:: Patient Choice offered to / list presented to : Patient  Expected Discharge Plan and Services Expected Discharge Plan: [place] In-house Referral: NA Discharge Planning Services: CM Consult Post Acute Care Choice: [place] arrangements for the past 2 months: Single Family Home                 DME Arranged: N/A DME Agency: NA       HH Arranged: RN, PT, OT [place] Agency: [place] (now known at [place]) Date Trainer: 07/27/22 Time [place]: 32 Representative spoke with at [place]: [place] Arrangements/Services Living arrangements for the past 2 months: [place] with:: Self, Spouse   Do you feel safe going back to the place where you live?: Yes      Need for Family Participation in Patient Care: Yes (Comment) Care giver support system in place?: Yes (comment) Current home  services: DME (cane) Criminal Activity/Legal Involvement Pertinent to Current Situation/Hospitalization: No - Comment as needed  Activities of Daily Living Home Assistive Devices/Equipment: Cane (specify quad or straight) ADL Screening (condition at time of admission) Patient's cognitive ability adequate to safely complete daily activities?: No Is the patient deaf or have difficulty hearing?: No Does the patient have difficulty seeing, even when wearing glasses/contacts?: No Does the patient have difficulty concentrating, remembering, or making decisions?: No Patient able to express need for assistance with ADLs?: Yes Does the patient have difficulty dressing or bathing?: No Independently performs ADLs?: Yes (appropriate for developmental age) Does the patient have difficulty walking or climbing stairs?: No Weakness of Legs: None Weakness of Arms/Hands: None  Permission Sought/Granted Permission sought to share information with : Family Supports          Permission granted to share info w Relationship: sister and daughter     Emotional Assessment Appearance:: Appears stated age Attitude/Demeanor/Rapport: Engaged Affect (typically observed): Accepting Orientation: : Oriented to Self, Oriented to Place, Oriented to  Time, Oriented to Situation Alcohol / Substance Use: Not Applicable Psych Involvement: No (comment)  Admission diagnosis:  Incarcerated umbilical hernia [B01.7] Small bowel obstruction ([place]) [K56.609] Sepsis with acute renal failure and septic shock, due to unspecified organism, unspecified acute renal failure type ([place]) [A41.9, R65.21, N17.9] Status post surgery [Z98.890] Incarcerated hernia [K46.0] Patient Active Problem List   Diagnosis Date Noted   Strangulated umbilical hernia 51/01/5851  AKI (acute kidney injury) ([place]) 07/24/2022   Glaucoma 07/24/2022   Chronic intermittent hypoxia with obstructive sleep apnea 01/16/2022   Ankle edema, bilateral 12/09/2021    Poor sleep hygiene 10/24/2021   Loud snoring 10/24/2021   Sleeps in sitting position due to orthopnea 10/24/2021   Obesity, Class III, BMI 40-49.9 (morbid obesity) ([place]) 10/24/2021   Venous stasis of both lower extremities 08/02/2020   Hyperparathyroidism, secondary renal ([place]) 08/03/2019   Class 3 severe obesity due to excess calories with serious comorbidity and body mass index (BMI) of 45.0 to 49.9 in adult ([place]) 04/03/2019   Diabetic retinopathy associated with type 2 diabetes mellitus ([place]) 06/10/2018   Anemia of chronic disease 05/28/2018   CKD stage 3 due to type 2 diabetes mellitus ([place]) 05/26/2018   Venous stasis ulcer of left ankle limited to breakdown of skin without varicose veins ([place]) 04/30/2018   Hyperlipidemia associated with type 2 diabetes mellitus ([place]) 11/16/2017   Gout 07/07/2016   Generalized anxiety disorder 02/18/2016   Macular degeneration 06/19/2015   Medication management 03/29/2014   Essential hypertension 11/16/2013   Type 2 diabetes mellitus with stage 3 chronic kidney disease, with long-term current use of insulin ([place]) 11/16/2013   Vitamin D deficiency 11/16/2013   OSA (obstructive sleep apnea) 11/16/2013   Hypothyroidism 11/16/2013   PCP:  [name], MD Pharmacy:   [facility] [place], [place] [address] Phone: [phone] Fax: [place], [place] [address] Phone: [phone] Fax: [place] [address] [place] [address] [place] [address] Phone: [phone] Fax: [phone]     Social Determinants of Health (SDOH) Interventions    Readmission Risk Interventions     No data to display

## 2022-07-28 DIAGNOSIS — K420 Umbilical hernia with obstruction, without gangrene: Secondary | ICD-10-CM | POA: Diagnosis not present

## 2022-07-28 LAB — GLUCOSE, CAPILLARY
Glucose-Capillary: 100 mg/dL — ABNORMAL HIGH (ref 70–99)
Glucose-Capillary: 147 mg/dL — ABNORMAL HIGH (ref 70–99)
Glucose-Capillary: 134 mg/dL — ABNORMAL HIGH (ref 70–99)
Glucose-Capillary: 129 mg/dL — ABNORMAL HIGH (ref 70–99)
Glucose-Capillary: 127 mg/dL — ABNORMAL HIGH (ref 70–99)

## 2022-07-28 LAB — CBC
HCT: 27.7 % — ABNORMAL LOW (ref 36.0–46.0)
Hemoglobin: 9.1 g/dL — ABNORMAL LOW (ref 12.0–15.0)
MCH: 28.6 pg (ref 26.0–34.0)
MCHC: 32.9 g/dL (ref 30.0–36.0)
MCV: 87.1 fL (ref 80.0–100.0)
Platelets: 330 10*3/uL (ref 150–400)
RBC: 3.18 MIL/uL — ABNORMAL LOW (ref 3.87–5.11)
RDW: 13.9 % (ref 11.5–15.5)
WBC: 11.1 10*3/uL — ABNORMAL HIGH (ref 4.0–10.5)
nRBC: 0 % (ref 0.0–0.2)

## 2022-07-28 LAB — BASIC METABOLIC PANEL
Anion gap: 8 (ref 5–15)
BUN: 14 mg/dL (ref 8–23)
CO2: 23 mmol/L (ref 22–32)
Calcium: 8 mg/dL — ABNORMAL LOW (ref 8.9–10.3)
Chloride: 108 mmol/L (ref 98–111)
Creatinine, Ser: 1 mg/dL (ref 0.44–1.00)
GFR, Estimated: 58 mL/min — ABNORMAL LOW (ref >60)
Glucose, Bld: 110 mg/dL — ABNORMAL HIGH (ref 70–99)
Potassium: 3.3 mmol/L — ABNORMAL LOW (ref 3.5–5.1)
Sodium: 139 mmol/L (ref 135–145)

## 2022-07-28 LAB — CULTURE, BLOOD (ROUTINE X 2)
Culture: NO GROWTH
Special Requests: ADEQUATE

## 2022-07-28 MED ORDER — POTASSIUM CHLORIDE 10 MEQ/100ML IV SOLN
10.0000 meq | INTRAVENOUS | Status: AC
  Administered 2022-07-28 (×4): 10 meq via INTRAVENOUS
  Filled 2022-07-28 (×4): qty 100

## 2022-07-28 NOTE — Progress Notes (Addendum)
4 Days Post-Op  Subjective: CC: No real abdominal pain this morning. Tolerating cld without n/v. Drinking a good bit. No prn pain or anti-nausea medications. No flatus or bm. Voiding. Mobilized in halls yesterday.   Objective: Vital signs in last 24 hours: Temp:  [97.8 F (36.6 C)-98.5 F (36.9 C)] 97.8 F (36.6 C) (08/07 0729) Pulse Rate:  [78-87] 87 (08/07 0729) Resp:  [16-27] 18 (08/07 0729) BP: (122-139)/(74-85) 139/78 (08/07 0729) SpO2:  [97 %-98 %] 98 % (08/07 0729) FiO2 (%):  [21 %] 21 % (08/07 0009) Last BM Date :  (PTA)  Intake/Output from previous day: 08/06 0701 - 08/07 0700 In: 1229.4 [I.V.:829.4; IV Piggyback:400] Out: 1 [Urine:1] Intake/Output this shift: No intake/output data recorded.  PE: Gen: Awake and alert, nad Lungs: Normal rate and effort Abd: Soft, mild upper abdominal distension, some ttp at the top of her incision/epigastrium but otherwise NT. No rigidity or guarding. +BS. Midline wound clean with healthy granulation tissue at the base, no dehiscence.   Lab Results:  Recent Labs    07/27/22 0344 07/28/22 0145  WBC 15.2* 11.1*  HGB 9.3* 9.1*  HCT 28.6* 27.7*  PLT 334 330   BMET Recent Labs    07/27/22 0139 07/28/22 0145  NA 140 139  K 3.9 3.3*  CL 110 108  CO2 22 23  GLUCOSE 144* 110*  BUN 21 14  CREATININE 1.23* 1.00  CALCIUM 8.0* 8.0*   PT/INR No results for input(s): "LABPROT", "INR" in the last 72 hours. CMP     Component Value Date/Time   NA 139 07/28/2022 0145   K 3.3 (L) 07/28/2022 0145   CL 108 07/28/2022 0145   CO2 23 07/28/2022 0145   GLUCOSE 110 (H) 07/28/2022 0145   BUN 14 07/28/2022 0145   CREATININE 1.00 07/28/2022 0145   CREATININE 2.67 (H) 07/23/2022 1056   CALCIUM 8.0 (L) 07/28/2022 0145   PROT 8.2 (H) 07/23/2022 1917   ALBUMIN 4.5 07/23/2022 1917   AST 28 07/23/2022 1917   ALT 19 07/23/2022 1917   ALKPHOS 95 07/23/2022 1917   BILITOT 1.2 07/23/2022 1917   GFRNONAA 58 (L) 07/28/2022 0145    GFRNONAA 38 (L) 03/04/2021 1110   GFRAA 44 (L) 03/04/2021 1110   Lipase  No results found for: "LIPASE"  Studies/Results: No results found.  Anti-infectives: Anti-infectives (From admission, onward)    Start     Dose/Rate Route Frequency Ordered Stop   07/26/22 1400  ceFEPIme (MAXIPIME) 2 g in sodium chloride 0.9 % 100 mL IVPB        2 g 200 mL/hr over 30 Minutes Intravenous Every 12 hours 07/26/22 1322 07/29/22 2359   07/25/22 0500  ciprofloxacin (CIPRO) IVPB 400 mg  Status:  Discontinued        400 mg 200 mL/hr over 60 Minutes Intravenous Every 24 hours 07/24/22 1350 07/24/22 1444   07/24/22 2100  ceFEPIme (MAXIPIME) 1 g in sodium chloride 0.9 % 100 mL IVPB  Status:  Discontinued        1 g 200 mL/hr over 30 Minutes Intravenous Every 24 hours 07/23/22 2029 07/24/22 0658   07/24/22 2100  metroNIDAZOLE (FLAGYL) IVPB 500 mg        500 mg 100 mL/hr over 60 Minutes Intravenous Every 12 hours 07/24/22 0659 07/29/22 2059   07/24/22 2100  ceFEPIme (MAXIPIME) 1 g in sodium chloride 0.9 % 100 mL IVPB  Status:  Discontinued        1 g  200 mL/hr over 30 Minutes Intravenous Every 24 hours 07/24/22 0658 07/26/22 1322   07/24/22 2000  ciprofloxacin (CIPRO) IVPB 400 mg  Status:  Discontinued        400 mg 200 mL/hr over 60 Minutes Intravenous Every 12 hours 07/24/22 0659 07/24/22 1350   07/24/22 0315  ciprofloxacin (CIPRO) IVPB 400 mg        400 mg 200 mL/hr over 60 Minutes Intravenous STAT 07/24/22 0311 07/24/22 0441   07/24/22 0315  metroNIDAZOLE (FLAGYL) IVPB 500 mg        500 mg 100 mL/hr over 60 Minutes Intravenous STAT 07/24/22 0311 07/24/22 0457   07/23/22 2000  ceFEPIme (MAXIPIME) 2 g in sodium chloride 0.9 % 100 mL IVPB        2 g 200 mL/hr over 30 Minutes Intravenous  Once 07/23/22 1957 07/23/22 2239   07/23/22 2000  metroNIDAZOLE (FLAGYL) IVPB 500 mg        500 mg 100 mL/hr over 60 Minutes Intravenous  Once 07/23/22 1957 07/23/22 2240        Assessment/Plan POD 4 Dx  Laparoscopy, exploratory laparotomy, SBR, primary repair of incisional hernia - Dr. Rosendo Gros, 07/24/2022 - FLD. AROBF. Make npo if any n/v - Cont abx 5d post op. Trend labs, follow vitals - BID WTD midline - Mobilize, up with RN/Mobility tech/therapies - she needs to be oob much more. Rec for [facility] PT/OT - Pulm toilet    FEN - FLD. IVF per primary  VTE - SCDs, Lovenox ID - Cefepime/Flagyl 8/2 >> Afebrile without tachycardia or hypotension. WBC down at 11.1 POC - Family at bedside. I updated the patients husband over speaker phone while I was in the room.    AKI on CKD3b - Cr improving this am @1 .00 ABL anemia  HTN Chronic dCHF DM2 HLD Hypothyroidism OSA on CPAP   LOS: 4 days    Jocelyn Camacho , [facility] Surgery 07/28/2022, 10:16 AM Please see Amion for pager number during day hours 7:00am-4:30pm

## 2022-07-28 NOTE — Care Management Important Message (Signed)
Important Message  Patient Details  Name: Jocelyn Camacho MRN: [id number] Date of Birth: February 04, 1946   Medicare Important Message Given:  Yes     Hannah Beat 07/28/2022, 1:26 PM

## 2022-07-28 NOTE — TOC Progression Note (Signed)
Transition of Care [facility]) - Progression Note    Patient Details  Name: Jocelyn Camacho MRN: [id number] Date of Birth: December 19, 1946  Transition of Care [facility]) CM/SW Contact  Bartholomew Crews, RN Phone Number: [phone] 07/28/2022, 11:45 AM  Clinical Narrative:     Confirmed HH referral for RN, PT, OT accepted by [facility]. Requested [place] Face to Face orders from provider. TOC following for transition needs.   Expected Discharge Plan: [place] Barriers to Discharge: Continued Medical Work up  Expected Discharge Plan and Services Expected Discharge Plan: [place] In-house Referral: NA Discharge Planning Services: CM Consult Post Acute Care Choice: [place] arrangements for the past 2 months: Single Family Home                 DME Arranged: N/A DME Agency: NA       HH Arranged: RN, PT, OT HH Agency: [place] Date HH Agency Contacted: 07/28/22 Time [place]: 2924 Representative spoke with at [place]: Hoyle Sauer   Social Determinants of Health ([place]) Interventions    Readmission Risk Interventions     No data to display

## 2022-07-28 NOTE — Plan of Care (Signed)

## 2022-07-28 NOTE — Plan of Care (Signed)
  Problem: Education: Goal: Knowledge of General Education information will improve Description: Including pain rating scale, medication(s)/side effects and non-pharmacologic comfort measures Outcome: Progressing   Problem: Activity: Goal: Risk for activity intolerance will decrease Outcome: Progressing   Problem: Nutrition: Goal: Adequate nutrition will be maintained Outcome: Progressing   Problem: Coping: Goal: Level of anxiety will decrease Outcome: Progressing   

## 2022-07-28 NOTE — Progress Notes (Signed)
Physical Therapy Treatment Patient Details Name: Jocelyn Camacho MRN: [id number] DOB: November 20, 1946 Today's Date: 07/28/2022   History of Present Illness Patient is a 76 y/o female who presents to [facility] on 07/23/22 due to abdominal pain, N/V. Found to have SBO and incarcerated hernia, transferred to [facility] and now s/p exp laparatomy, small bowel resection and hernia repair 07/24/22. PMH includes HTN, anxiety, DM, OSA, obesity.    PT Comments    Patient is agreeable to PT. She was already in the chair on arrival to room, and reports she walked the halls with staff earlier today. She reports mild dizziness initially with sitting forward in the chair with blood pressure 134/76. The patient ambulated in hallway with rolling walker with Min guard to supervision. Mild shortness of breath noted towards end of walk with vitals stable throughout on room air. Recommend to continue PT to maximize independence and facilitate return to prior level of function. Anticipate patient can return home with intermittent assistance from family and HHPT.    Recommendations for follow up therapy are one component of a multi-disciplinary discharge planning process, led by the attending physician.  Recommendations may be updated based on patient status, additional functional criteria and insurance authorization.  Follow Up Recommendations  Home health PT     Assistance Recommended at Discharge Intermittent Supervision/Assistance  Patient can return home with the following A little help with walking and/or transfers;A little help with bathing/dressing/bathroom;Help with stairs or ramp for entrance;Assist for transportation;Assistance with cooking/housework   Equipment Recommendations  Rolling walker (2 wheels);BSC/3in1    Recommendations for Other Services       Precautions / Restrictions Precautions Precautions: Fall Restrictions Weight Bearing Restrictions: No     Mobility  Bed Mobility                General bed mobility comments: not assessed as patient sitting up in chair on arrival to room    Transfers Overall transfer level: Needs assistance Equipment used: Rolling walker (2 wheels) Transfers: Sit to/from Stand Sit to Stand: Min assist           General transfer comment: patient reports dizziness intiailly when transitioning from reclined position to upright seated position. blood pressure 134/76 and dizziness subsided after several minutes. lifting assistance for standing. verbal cues for hand placement for safety    Ambulation/Gait Ambulation/Gait assistance: Min guard, Supervision Gait Distance (Feet): 250 Feet Assistive device: Rolling walker (2 wheels) Gait Pattern/deviations: Step-through pattern Gait velocity: decreased     General Gait Details: encouraged patient to continue using rolling walker for safety with ambulation. mild shortness of breath noted towards end of walk with vitals stable on room air. mild abdominal discomfort reported while mobilizing.   Stairs             Wheelchair Mobility    Modified Rankin (Stroke Patients Only)       Balance Overall balance assessment: Needs assistance Sitting-balance support: Feet supported Sitting balance-Leahy Scale: Good     Standing balance support: Reliant on assistive device for balance Standing balance-Leahy Scale: Fair Standing balance comment: with intermittent RW use while standing                            Cognition Arousal/Alertness: Awake/alert Behavior During Therapy: Flat affect Overall Cognitive Status: Within Functional Limits for tasks assessed  Exercises      General Comments        Pertinent Vitals/Pain Pain Assessment Pain Assessment: Faces Faces Pain Scale: Hurts a little bit Pain Location: abdomen Pain Descriptors / Indicators: Discomfort    Home Living                           Prior Function            PT Goals (current goals can now be found in the care plan section) Acute Rehab PT Goals Patient Stated Goal: return to independence PT Goal Formulation: With patient Time For Goal Achievement: 08/08/22 Potential to Achieve Goals: Good Progress towards PT goals: Progressing toward goals    Frequency    Min 3X/week      PT Plan Current plan remains appropriate    Co-evaluation              AM-PAC PT "6 Clicks" Mobility   Outcome Measure  Help needed turning from your back to your side while in a flat bed without using bedrails?: A Little Help needed moving from lying on your back to sitting on the side of a flat bed without using bedrails?: A Lot Help needed moving to and from a bed to a chair (including a wheelchair)?: A Little Help needed standing up from a chair using your arms (e.g., wheelchair or bedside chair)?: A Little Help needed to walk in hospital room?: A Little Help needed climbing 3-5 steps with a railing? : A Lot 6 Click Score: 16    End of Session   Activity Tolerance: Patient tolerated treatment well Patient left: in chair;with call bell/phone within reach;with family/visitor present Nurse Communication: Mobility status PT Visit Diagnosis: Pain;Muscle weakness (generalized) (M62.81);Difficulty in walking, not elsewhere classified (R26.2);Unsteadiness on feet (R26.81) Pain - part of body:  (abdomen)     Time: 8592-9244 PT Time Calculation (min) (ACUTE ONLY): 33 min  Charges:  $Therapeutic Activity: 23-37 mins                    Minna Merritts, PT, MPT    Jocelyn Camacho 07/28/2022, 12:07 PM

## 2022-07-28 NOTE — Progress Notes (Signed)
PROGRESS NOTE    Jocelyn Camacho  FAO:[id number] DOB: 21-Jul-1946 DOA: 07/23/2022 PCP: Lucky Cowboy, MD    Brief Narrative:   Jocelyn Camacho is a 76 y.o. female with past medical history significant for essential hypertension, type 2 diabetes mellitus, hypothyroidism, hyperlipidemia, chronic diastolic congestive heart failure, CKD stage IIIb, gout, glaucoma, OSA, morbid obesity who presented to [facility] ED on 8/2 with progressive nausea/vomiting and abdominal pain.  Onset a few days prior, she was seen by her PCP and was prescribed oral Zofran and sent back home as thought this was related to gastroenteritis.  Daughter was concerned that there was some thing else going on and patient was sent to [facility] for further evaluation.  In the ED, temperature 96.6 F, HR 112, BP 104/70, RR 37, SPO2 100% on room air.  WBC 28.9, hemoglobin 13.0, platelets 447.  Sodium 138, potassium 3.8, chloride 96, CO2 24, BUN 51, creatinine 2.67, glucose 167.  AST 17, ALT 16, total bilirubin 1.1.  Lactic acid 3.3.  TSH 0.46.  Hemoglobin A1c 5.2.  Urinalysis with small leukocytes, negative nitrite, rare bacteria, 0-5 WBCs.  CT abdomen/pelvis without contrast with infraumbilical hernia containing a small bowel loop with incarceration and proximal obstruction.  General surgery was consulted.  Patient was transferred to [facility].  Hospitalist concern for further evaluation and management of incarcerated hernia with subsequent bowel obstruction.  Assessment & Plan:   Incarcerated hernia with ischemic bowel  Patient presenting initially to urgent care followed by [facility] with progressive abdominal pain with associated nausea vomiting.  Patient with notable leukocytosis and elevated lactic acid.  Imaging notable for infraumbilical hernia containing a small bowel loop with incarceration of proximal obstruction.  Patient was transferred to [facility] and patient underwent exploratory laparotomy  with small bowel resection and hernia repair by Dr. Derrell Lolling 07/24/2022.  Pathology with ischemic necrosis transmural, mesenteric venous thrombi no edematous change or malignancy identified --General surgery following, appreciate assistance --WBC 28.9>>10.9>15.2>11.1 --Lactic acid 3.3>2.3>0.9 --Cefepime and metronidazole; plan 5-day course postoperatively per general surgery --Advance to full liquid diet by general surgery today --LR at 75 mL/h --Supportive care, awaiting return of bowel function, increase mobility --CBC daily --Further per general surgery  Acute renal failure on CKD stage IIIb: Resolved Patient presenting with a creatinine of 2.67 with peak of 3.76 during hospitalization.  Baseline creatinine 1.4-1.6.  Etiology likely secondary to prerenal azotemia in the setting of nausea and vomiting from bowel obstruction as above. --Cr 2.67>3.76>2.18>1.51>1.23>1.00 --Continue IV fluid hydration as above --Avoid nephrotoxins, renal dose all medications --BMP daily  Acute postoperative blood loss anemia Hemoglobin 13.0 on admission, postoperatively down to 9.5>9.3; stable.. --CBC daily --Transfuse for hemoglobin less than 7.0; or active bleeding  Essential hypertension Chronic diastolic congestive heart failure, compensated Home regimen includes atenolol 100 mg p.o. daily furosemide 40 mg p.o. daily, losartan 100 mg p.o. daily, Finerenone 10 mg p.o. daily. --BP 131/85 this morning, well controlled --Continue to hold antihypertensives --Continue monitor BP closely  Type 2 diabetes mellitus Hemoglobin A1c 5.2, well controlled.  Home regimen includes NPH 20 units every morning, 5 units every afternoon, Trulicity 3 mg weekly. --SSI for coverage --CBGs TIDAC  Hypothyroidism On levothyroxine 125 mcg p.o. daily at home.  TSH within normal limits. --Holding oral medications until further return of bowel function, if greater than 7 days will initiate IV levothyroxine  Hyperlipidemia:  Holding statin for now  Gout: Holding allopurinol until further advancement of diet  Glaucoma:  Continue eyedrops  OSA --Continue nocturnal CPAP  Weakness/debility/deconditioning: --PT/OT recommending home health on discharge, continue therapy while inpatient  Morbid obesity Body mass index is 41.88 kg/m.  Discussed with patient needs for aggressive lifestyle changes/weight loss as this complicates all facets of care.  Outpatient follow-up with PCP.     DVT prophylaxis: enoxaparin (LOVENOX) injection 40 mg Start: 07/25/22 1000 SCDs Start: 07/24/22 1046    Code Status: Full Code Family Communication: Updated multiple family members at bedside  Disposition Plan:  Level of care: Progressive Status is: Inpatient Remains inpatient appropriate because: Awaiting further advancement of diet, general surgery sign off with anticipated discharge home when medically ready with home health    Consultants:  General surgery, Dr. Derrell Lolling  Procedures:  Exploratory laparotomy with small bowel resection and hernia repair, Dr. Derrell Lolling 8/3  Antimicrobials:  Cefepime 8/2>> Metronidazole 8/2>> Ciprofloxacin 8/2 - 8/2   Subjective: Patient seen examined bedside, resting comfortably.  Sitting in bedside chair.  Multiple family members present.  Continues to tolerate clear liquid diet without nausea/vomiting.  Seen by general surgery, diet further advanced to full liquids today.  Discussed increase mobilization. No other questions or concerns at this time.  Denies headache, no dizziness, no chest pain, no palpitations, no shortness of breath, no fever/chills/night sweats, no nausea/vomiting/diarrhea, no focal weakness, no cough/congestion, no paresthesias.  No acute events overnight per nursing staff.  Objective: Vitals:   07/27/22 1933 07/28/22 0009 07/28/22 0138 07/28/22 0729  BP: 128/79  131/85 139/78  Pulse: 78 80 80 87  Resp: (!) 27 (!) 21 (!) 22 18  Temp: 98.3 F (36.8 C)  98.5 F (36.9  C) 97.8 F (36.6 C)  TempSrc: Oral     SpO2: 98% 98% 97% 98%  Weight:      Height:        Intake/Output Summary (Last 24 hours) at 07/28/2022 1118 Last data filed at 07/28/2022 0700 Gross per 24 hour  Intake 1129.35 ml  Output 1 ml  Net 1128.35 ml   Filed Weights   07/23/22 2122  Weight: 103.9 kg    Examination:  Physical Exam: GEN: NAD, alert and oriented x 3, obese HEENT: NCAT, PERRL, EOMI, sclera clear, MMM PULM: CTAB w/o wheezes/crackles, normal respiratory effort CV: RRR w/o M/G/R GI: abd soft, nondistended, mild tenderness to palpation surrounding surgical site, no appreciable bowel sounds, surgical dressing in place, clean/dry/intact MSK: no peripheral edema, muscle strength globally intact 5/5 bilateral upper/lower extremities, noted right groin central line in place NEURO: CN II-XII intact, no focal deficits, sensation to light touch intact PSYCH: normal mood/affect Integumentary: dry/intact, no rashes or wounds    Data Reviewed: I have personally reviewed following labs and imaging studies  CBC: Recent Labs  Lab 07/23/22 1056 07/23/22 1917 07/25/22 0203 07/26/22 0122 07/27/22 0344 07/28/22 0145  WBC 28.9* 28.5* 10.9* 10.9* 15.2* 11.1*  NEUTROABS 25,345* 24.3*  --   --   --   --   HGB 13.0 13.8 9.5* 9.5* 9.3* 9.1*  HCT 39.4 42.1 28.2* 29.2* 28.6* 27.7*  MCV 87.8 86.8 87.6 89.8 89.1 87.1  PLT 447* 488* 315 319 334 330   Basic Metabolic Panel: Recent Labs  Lab 07/23/22 1917 07/25/22 0203 07/26/22 0122 07/27/22 0139 07/28/22 0145  NA 139 144 144 140 139  K 4.2 3.7 3.7 3.9 3.3*  CL 95* 108 114* 110 108  CO2 24 25 22 22 23   GLUCOSE 210* 142* 102* 144* 110*  BUN 62* 44* 31* 21 14  CREATININE 3.76* 2.18* 1.51* 1.23* 1.00  CALCIUM 10.4* 8.6* 8.4* 8.0* 8.0*  MG  --  2.3  --  2.1  --   PHOS  --  3.2  --   --   --    GFR: Estimated Creatinine Clearance: 54.1 mL/min (by C-G formula based on SCr of 1 mg/dL). Liver Function Tests: Recent Labs  Lab  07/23/22 1056 07/23/22 1917  AST 17 28  ALT 16 19  ALKPHOS  --  95  BILITOT 1.1 1.2  PROT 7.9 8.2*  ALBUMIN  --  4.5   No results for input(s): "LIPASE", "AMYLASE" in the last 168 hours. No results for input(s): "AMMONIA" in the last 168 hours. Coagulation Profile: Recent Labs  Lab 07/23/22 1917  INR 1.1   Cardiac Enzymes: No results for input(s): "CKTOTAL", "CKMB", "CKMBINDEX", "TROPONINI" in the last 168 hours. BNP (last 3 results) No results for input(s): "PROBNP" in the last 8760 hours. HbA1C: No results for input(s): "HGBA1C" in the last 72 hours.  CBG: Recent Labs  Lab 07/27/22 0845 07/27/22 1141 07/27/22 1623 07/27/22 1929 07/28/22 0729  GLUCAP 130* 134* 153* 132* 147*   Lipid Profile: No results for input(s): "CHOL", "HDL", "LDLCALC", "TRIG", "CHOLHDL", "LDLDIRECT" in the last 72 hours.  Thyroid Function Tests: No results for input(s): "TSH", "T4TOTAL", "FREET4", "T3FREE", "THYROIDAB" in the last 72 hours.  Anemia Panel: No results for input(s): "VITAMINB12", "FOLATE", "FERRITIN", "TIBC", "IRON", "RETICCTPCT" in the last 72 hours. Sepsis Labs: Recent Labs  Lab 07/23/22 1917 07/23/22 2207 07/26/22 0122  LATICACIDVEN 3.3* 2.3* 0.9    Recent Results (from the past 240 hour(s))  Culture, blood (Routine x 2)     Status: None (Preliminary result)   Collection Time: 07/23/22  7:00 PM   Specimen: BLOOD  Result Value Ref Range Status   Specimen Description   Final    BLOOD RIGHT Performed at [facility], [address] [place], [place] [address]    Special Requests   Final    BOTTLES DRAWN AEROBIC AND ANAEROBIC Blood Culture adequate volume Performed at [facility], [address], [place], [place] [address]    Culture   Final    NO GROWTH 4 DAYS Performed at [facility], [address], [place], [place] [address]    Report Status PENDING  Incomplete  Culture, blood (Routine x 2)     Status: None    Collection Time: 07/23/22  7:17 PM   Specimen: BLOOD  Result Value Ref Range Status   Specimen Description   Final    BLOOD RIGHT ANTECUBITAL Performed at [facility], [address] [place], [place] [address]    Special Requests   Final    Blood Culture adequate volume BOTTLES DRAWN AEROBIC AND ANAEROBIC Performed at [facility], [address] [place], [place] [address]    Culture   Final    NO GROWTH 5 DAYS Performed at [facility], [address], [place], [place] [address]    Report Status 07/28/2022 FINAL  Final  Urine Culture     Status: Abnormal   Collection Time: 07/24/22 12:01 AM   Specimen: In/Out Cath Urine  Result Value Ref Range Status   Specimen Description   Final    IN/OUT CATH URINE Performed at [facility], [address] [place] [address] [place], [place] [address]    Special Requests   Final    NONE Performed at [facility], [address]  [place], [place] [address]    Culture 1,000 COLONIES/mL ESCHERICHIA COLI (A)  Final   Report Status 07/27/2022 FINAL  Final   Organism ID, Bacteria ESCHERICHIA COLI (A)  Final      Susceptibility   Escherichia coli - MIC*    AMPICILLIN 8 SENSITIVE Sensitive     CEFAZOLIN <=4 SENSITIVE Sensitive     CEFEPIME <=0.12 SENSITIVE Sensitive     CEFTRIAXONE <=0.25 SENSITIVE Sensitive     CIPROFLOXACIN <=0.25 SENSITIVE Sensitive     GENTAMICIN <=1 SENSITIVE Sensitive     IMIPENEM <=0.25 SENSITIVE Sensitive     NITROFURANTOIN <=16 SENSITIVE Sensitive     TRIMETH/SULFA >=320 RESISTANT Resistant     AMPICILLIN/SULBACTAM <=2 SENSITIVE Sensitive     PIP/TAZO <=4 SENSITIVE Sensitive     * 1,000 COLONIES/mL ESCHERICHIA COLI         Radiology Studies: No results found.      Scheduled Meds:  dorzolamide-timolol  1 drop Left Eye BID   enoxaparin (LOVENOX) injection  40 mg Subcutaneous Q24H   insulin aspart  0-15 Units Subcutaneous TID WC    insulin aspart  0-5 Units Subcutaneous QHS   Continuous Infusions:  ceFEPime (MAXIPIME) IV 2 g (07/28/22 0236)   lactated ringers 75 mL/hr at 07/28/22 0810   metronidazole 500 mg (07/27/22 2028)   potassium chloride 10 mEq (07/28/22 1057)     LOS: 4 days    Time spent: 49 minutes spent on chart review, discussion with nursing staff, consultants, updating family and interview/physical exam; more than 50% of that time was spent in counseling and/or coordination of care.    Alvira Philips [place], DO [facility] Available via Epic secure chat 7am-7pm After these hours, please refer to coverage provider listed on [email or web address] 07/28/2022, 11:18 AM

## 2022-07-29 DIAGNOSIS — K420 Umbilical hernia with obstruction, without gangrene: Secondary | ICD-10-CM | POA: Diagnosis not present

## 2022-07-29 LAB — CBC
HCT: 28 % — ABNORMAL LOW (ref 36.0–46.0)
Hemoglobin: 9.4 g/dL — ABNORMAL LOW (ref 12.0–15.0)
MCH: 29.4 pg (ref 26.0–34.0)
MCHC: 33.6 g/dL (ref 30.0–36.0)
MCV: 87.5 fL (ref 80.0–100.0)
Platelets: 353 10*3/uL (ref 150–400)
RBC: 3.2 MIL/uL — ABNORMAL LOW (ref 3.87–5.11)
RDW: 14.2 % (ref 11.5–15.5)
WBC: 14.5 10*3/uL — ABNORMAL HIGH (ref 4.0–10.5)
nRBC: 0.3 % — ABNORMAL HIGH (ref 0.0–0.2)

## 2022-07-29 LAB — CULTURE, BLOOD (ROUTINE X 2)
Culture: NO GROWTH
Special Requests: ADEQUATE

## 2022-07-29 LAB — BASIC METABOLIC PANEL
Anion gap: 8 (ref 5–15)
BUN: 10 mg/dL (ref 8–23)
CO2: 21 mmol/L — ABNORMAL LOW (ref 22–32)
Calcium: 8 mg/dL — ABNORMAL LOW (ref 8.9–10.3)
Chloride: 110 mmol/L (ref 98–111)
Creatinine, Ser: 1.11 mg/dL — ABNORMAL HIGH (ref 0.44–1.00)
GFR, Estimated: 52 mL/min — ABNORMAL LOW (ref >60)
Glucose, Bld: 113 mg/dL — ABNORMAL HIGH (ref 70–99)
Potassium: 3.8 mmol/L (ref 3.5–5.1)
Sodium: 139 mmol/L (ref 135–145)

## 2022-07-29 LAB — GLUCOSE, CAPILLARY
Glucose-Capillary: 114 mg/dL — ABNORMAL HIGH (ref 70–99)
Glucose-Capillary: 148 mg/dL — ABNORMAL HIGH (ref 70–99)
Glucose-Capillary: 184 mg/dL — ABNORMAL HIGH (ref 70–99)
Glucose-Capillary: 133 mg/dL — ABNORMAL HIGH (ref 70–99)

## 2022-07-29 MED ORDER — LEVOTHYROXINE SODIUM 25 MCG PO TABS
125.0000 ug | ORAL_TABLET | Freq: Every day | ORAL | Status: DC
  Administered 2022-07-30 – 2022-08-01 (×3): 125 ug via ORAL
  Filled 2022-07-29 (×3): qty 1

## 2022-07-29 MED ORDER — ATENOLOL 50 MG PO TABS
100.0000 mg | ORAL_TABLET | Freq: Every day | ORAL | Status: DC
  Administered 2022-07-29 – 2022-08-01 (×4): 100 mg via ORAL
  Filled 2022-07-29 (×4): qty 2

## 2022-07-29 MED ORDER — BOOST / RESOURCE BREEZE PO LIQD CUSTOM
1.0000 | Freq: Three times a day (TID) | ORAL | Status: DC
  Administered 2022-07-29 – 2022-08-01 (×8): 1 via ORAL

## 2022-07-29 NOTE — Progress Notes (Signed)
Pt refused to wear C-PAP.

## 2022-07-29 NOTE — Progress Notes (Signed)
PROGRESS NOTE    Jocelyn Camacho  ZDG:[id number] DOB: 11/01/1946 DOA: 07/23/2022 PCP: Lucky Cowboy, MD    Brief Narrative:   Jocelyn Camacho is a 76 y.o. female with past medical history significant for essential hypertension, type 2 diabetes mellitus, hypothyroidism, hyperlipidemia, chronic diastolic congestive heart failure, CKD stage IIIb, gout, glaucoma, OSA, morbid obesity who presented to [facility] ED on 8/2 with progressive nausea/vomiting and abdominal pain.  Onset a few days prior, she was seen by her PCP and was prescribed oral Zofran and sent back home as thought this was related to gastroenteritis.  Daughter was concerned that there was some thing else going on and patient was sent to med [facility] for further evaluation.  In the ED, temperature 96.6 F, HR 112, BP 104/70, RR 37, SPO2 100% on room air.  WBC 28.9, hemoglobin 13.0, platelets 447.  Sodium 138, potassium 3.8, chloride 96, CO2 24, BUN 51, creatinine 2.67, glucose 167.  AST 17, ALT 16, total bilirubin 1.1.  Lactic acid 3.3.  TSH 0.46.  Hemoglobin A1c 5.2.  Urinalysis with small leukocytes, negative nitrite, rare bacteria, 0-5 WBCs.  CT abdomen/pelvis without contrast with infraumbilical hernia containing a small bowel loop with incarceration and proximal obstruction.  General surgery was consulted.  Patient was transferred to [facility].  Hospitalist concern for further evaluation and management of incarcerated hernia with subsequent bowel obstruction.  Assessment & Plan:   Incarcerated hernia with ischemic bowel  Patient presenting initially to urgent care followed by [facility] with progressive abdominal pain with associated nausea vomiting.  Patient with notable leukocytosis and elevated lactic acid.  Imaging notable for infraumbilical hernia containing a small bowel loop with incarceration of proximal obstruction.  Patient was transferred to [facility] and patient underwent exploratory laparotomy  with small bowel resection and hernia repair by Dr. Derrell Lolling 07/24/2022.  Pathology with ischemic necrosis transmural, mesenteric venous thrombi no edematous change or malignancy identified --General surgery following, appreciate assistance --WBC 28.9>>10.9>15.2>11.1>14.5 --Lactic acid 3.3>2.3>0.9 --Cefepime and metronidazole; plan 5-day course postoperatively per general surgery --Advance to soft diet by general surgery today --Supportive care; encourage increased mobilization --CBC daily --Further per general surgery; plan CT abdomen/pelvis if WBC count continues to rise tomorrow  Acute renal failure on CKD stage IIIb: Resolved Patient presenting with a creatinine of 2.67 with peak of 3.76 during hospitalization.  Baseline creatinine 1.4-1.6.  Etiology likely secondary to prerenal azotemia in the setting of nausea and vomiting from bowel obstruction as above. --Cr 2.67>3.76>2.18>1.51>1.23>1.00>1.11 --Avoid nephrotoxins, renal dose all medications --BMP daily  Acute postoperative blood loss anemia Hemoglobin 13.0 on admission, postoperatively down to 9.4; stable.. --CBC daily --Transfuse for hemoglobin less than 7.0; or active bleeding  Essential hypertension Chronic diastolic congestive heart failure, compensated Home regimen includes atenolol 100 mg p.o. daily furosemide 40 mg p.o. daily, losartan 100 mg p.o. daily, Finerenone 10 mg p.o. daily. --BP 153/83 this morning --Restart atenolol --Continue to hold losartan, furosemide, finerenone for now --Continue monitor BP closely  Type 2 diabetes mellitus Hemoglobin A1c 5.2, well controlled.  Home regimen includes NPH 20 units every morning, 5 units every afternoon, Trulicity 3 mg weekly. --SSI for coverage --CBGs TIDAC  Hypothyroidism On levothyroxine 125 mcg p.o. daily at home.  TSH within normal limits. --Restart levothyroxine  Hyperlipidemia: Holding statin for now  Gout: Holding allopurinol until further advancement of  diet  Glaucoma: Continue eyedrops  OSA --Continue nocturnal CPAP  Weakness/debility/deconditioning: --PT/OT recommending home health on discharge, continue therapy  while inpatient  Morbid obesity Body mass index is 41.88 kg/m.  Discussed with patient needs for aggressive lifestyle changes/weight loss as this complicates all facets of care.  Outpatient follow-up with PCP.     DVT prophylaxis: enoxaparin (LOVENOX) injection 40 mg Start: 07/25/22 1000 SCDs Start: 07/24/22 1046    Code Status: Full Code Family Communication: Updated family present at bedside this morning  Disposition Plan:  Level of care: Progressive Status is: Inpatient Remains inpatient appropriate because: Awaiting to ensure bowel function returns and general surgery sign off; anticipated discharge home when medically ready with home health    Consultants:  General surgery, Dr. Derrell Lolling  Procedures:  Exploratory laparotomy with small bowel resection and hernia repair, Dr. Derrell Lolling 8/3  Antimicrobials:  Cefepime 8/2>> Metronidazole 8/2>> Ciprofloxacin 8/2 - 8/2   Subjective: Patient seen examined bedside, resting comfortably.  Sitting in bedside chair.  Family present.  Tolerating full liquid diet without nausea/vomiting and reported bowel movement overnight; advanced to soft diet today by general surgery.  WBC slightly increased from yesterday, if further increases general surgery plans repeat CT abdomen/pelvis tomorrow.  No other questions or concerns at this time.  Denies headache, no dizziness, no chest pain, no palpitations, no shortness of breath, no fever/chills/night sweats, no nausea/vomiting/diarrhea, no focal weakness, no cough/congestion, no paresthesias.  No acute events overnight per nursing staff.  Objective: Vitals:   07/29/22 0115 07/29/22 0147 07/29/22 0318 07/29/22 0728  BP:  133/89  (!) 153/83  Pulse: 97 95 98 99  Resp: (!) 25 (!) 24 (!) 23 18  Temp:  98.1 F (36.7 C)  99.8 F (37.7  C)  TempSrc:  Oral    SpO2: 96% 94% 97%   Weight:      Height:        Intake/Output Summary (Last 24 hours) at 07/29/2022 1249 Last data filed at 07/29/2022 0400 Gross per 24 hour  Intake 3528.24 ml  Output --  Net 3528.24 ml   Filed Weights   07/23/22 2122  Weight: 103.9 kg    Examination:  Physical Exam: GEN: NAD, alert and oriented x 3, obese HEENT: NCAT, PERRL, EOMI, sclera clear, MMM PULM: CTAB w/o wheezes/crackles, normal respiratory effort CV: RRR w/o M/G/R GI: abd soft, nondistended, mild tenderness to palpation surrounding surgical site, no appreciable bowel sounds, surgical dressing in place, clean/dry/intact MSK: no peripheral edema, muscle strength globally intact 5/5 bilateral upper/lower extremities, noted right groin central line in place NEURO: CN II-XII intact, no focal deficits, sensation to light touch intact PSYCH: normal mood/affect Integumentary: dry/intact, no rashes or wounds    Data Reviewed: I have personally reviewed following labs and imaging studies  CBC: Recent Labs  Lab 07/23/22 1056 07/23/22 1917 07/25/22 0203 07/26/22 0122 07/27/22 0344 07/28/22 0145 07/29/22 0650  WBC 28.9* 28.5* 10.9* 10.9* 15.2* 11.1* 14.5*  NEUTROABS 25,345* 24.3*  --   --   --   --   --   HGB 13.0 13.8 9.5* 9.5* 9.3* 9.1* 9.4*  HCT 39.4 42.1 28.2* 29.2* 28.6* 27.7* 28.0*  MCV 87.8 86.8 87.6 89.8 89.1 87.1 87.5  PLT 447* 488* 315 319 334 330 353   Basic Metabolic Panel: Recent Labs  Lab 07/25/22 0203 07/26/22 0122 07/27/22 0139 07/28/22 0145 07/29/22 0650  NA 144 144 140 139 139  K 3.7 3.7 3.9 3.3* 3.8  CL 108 114* 110 108 110  CO2 25 22 22 23  21*  GLUCOSE 142* 102* 144* 110* 113*  BUN 44* 31* 21 14  10  CREATININE 2.18* 1.51* 1.23* 1.00 1.11*  CALCIUM 8.6* 8.4* 8.0* 8.0* 8.0*  MG 2.3  --  2.1  --   --   PHOS 3.2  --   --   --   --    GFR: Estimated Creatinine Clearance: 48.7 mL/min (A) (by C-G formula based on SCr of 1.11 mg/dL (H)). Liver  Function Tests: Recent Labs  Lab 07/23/22 1056 07/23/22 1917  AST 17 28  ALT 16 19  ALKPHOS  --  95  BILITOT 1.1 1.2  PROT 7.9 8.2*  ALBUMIN  --  4.5   No results for input(s): "LIPASE", "AMYLASE" in the last 168 hours. No results for input(s): "AMMONIA" in the last 168 hours. Coagulation Profile: Recent Labs  Lab 07/23/22 1917  INR 1.1   Cardiac Enzymes: No results for input(s): "CKTOTAL", "CKMB", "CKMBINDEX", "TROPONINI" in the last 168 hours. BNP (last 3 results) No results for input(s): "PROBNP" in the last 8760 hours. HbA1C: No results for input(s): "HGBA1C" in the last 72 hours.  CBG: Recent Labs  Lab 07/28/22 1227 07/28/22 1539 07/28/22 2012 07/29/22 0732 07/29/22 1144  GLUCAP 134* 129* 127* 114* 148*   Lipid Profile: No results for input(s): "CHOL", "HDL", "LDLCALC", "TRIG", "CHOLHDL", "LDLDIRECT" in the last 72 hours.  Thyroid Function Tests: No results for input(s): "TSH", "T4TOTAL", "FREET4", "T3FREE", "THYROIDAB" in the last 72 hours.  Anemia Panel: No results for input(s): "VITAMINB12", "FOLATE", "FERRITIN", "TIBC", "IRON", "RETICCTPCT" in the last 72 hours. Sepsis Labs: Recent Labs  Lab 07/23/22 1917 07/23/22 2207 07/26/22 0122  LATICACIDVEN 3.3* 2.3* 0.9    Recent Results (from the past 240 hour(s))  Culture, blood (Routine x 2)     Status: None   Collection Time: 07/23/22  7:00 PM   Specimen: BLOOD  Result Value Ref Range Status   Specimen Description   Final    BLOOD RIGHT Performed at [facility], [address], [place], [place] [address]    Special Requests   Final    BOTTLES DRAWN AEROBIC AND ANAEROBIC Blood Culture adequate volume Performed at [facility], [address], [place], [place] [address]    Culture   Final    NO GROWTH 5 DAYS Performed at [facility], [address] [place], [place] [address]    Report Status 07/29/2022 FINAL  Final  Culture, blood (Routine x 2)      Status: None   Collection Time: 07/23/22  7:17 PM   Specimen: BLOOD  Result Value Ref Range Status   Specimen Description   Final    BLOOD RIGHT ANTECUBITAL Performed at [facility], [address] [place], [place] [address]    Special Requests   Final    Blood Culture adequate volume BOTTLES DRAWN AEROBIC AND ANAEROBIC Performed at [facility], [address], [place], [place] [address]    Culture   Final    NO GROWTH 5 DAYS Performed at [facility], [address] [place], [place] [address]    Report Status 07/28/2022 FINAL  Final  Urine Culture     Status: Abnormal   Collection Time: 07/24/22 12:01 AM   Specimen: In/Out Cath Urine  Result Value Ref Range Status   Specimen Description   Final    IN/OUT CATH URINE Performed at [facility], [address] [place], [place] [address]    Special Requests   Final    NONE Performed at [facility],  [address], [place], [place] [address]    Culture 1,000 COLONIES/mL ESCHERICHIA COLI (A)  Final   Report Status 07/27/2022 FINAL  Final   Organism ID, Bacteria ESCHERICHIA COLI (A)  Final      Susceptibility   Escherichia coli - MIC*    AMPICILLIN 8 SENSITIVE Sensitive     CEFAZOLIN <=4 SENSITIVE Sensitive     CEFEPIME <=0.12 SENSITIVE Sensitive     CEFTRIAXONE <=0.25 SENSITIVE Sensitive     CIPROFLOXACIN <=0.25 SENSITIVE Sensitive     GENTAMICIN <=1 SENSITIVE Sensitive     IMIPENEM <=0.25 SENSITIVE Sensitive     NITROFURANTOIN <=16 SENSITIVE Sensitive     TRIMETH/SULFA >=320 RESISTANT Resistant     AMPICILLIN/SULBACTAM <=2 SENSITIVE Sensitive     PIP/TAZO <=4 SENSITIVE Sensitive     * 1,000 COLONIES/mL ESCHERICHIA COLI         Radiology Studies: No results found.      Scheduled Meds:  dorzolamide-timolol  1 drop Left Eye BID   enoxaparin (LOVENOX) injection  40 mg Subcutaneous Q24H   feeding supplement  1 Container  Oral TID BM   insulin aspart  0-15 Units Subcutaneous TID WC   insulin aspart  0-5 Units Subcutaneous QHS   Continuous Infusions:  ceFEPime (MAXIPIME) IV 2 g (07/29/22 0236)   lactated ringers 75 mL/hr at 07/28/22 2102   metronidazole 500 mg (07/29/22 0237)     LOS: 5 days    Time spent: 49 minutes spent on chart review, discussion with nursing staff, consultants, updating family and interview/physical exam; more than 50% of that time was spent in counseling and/or coordination of care.    Alvira Philips [place], DO [facility] Available via Epic secure chat 7am-7pm After these hours, please refer to coverage provider listed on [email or web address] 07/29/2022, 12:49 PM

## 2022-07-29 NOTE — Progress Notes (Signed)
Occupational Therapy Treatment Patient Details Name: Jocelyn Camacho MRN: [id number] DOB: 10/11/1946 Today's Date: 07/29/2022   History of present illness 76 y/o female who presents to [place] on 07/23/22 due to abdominal pain, N/V. Found to have SBO and incarcerated hernia, transferred to [facility] and now s/p exp laparatomy, small bowel resection and hernia repair 07/24/22. PMH includes HTN, anxiety, DM, OSA, obesity.   OT comments  Pt vomiting on arrival and session focused on bathing and grooming as pt was not willing to continue with transfers due to nauseated. Rn in room to (A) with IV as R Ue appears to be swollen. Pt reports some discomfort at IV site. Recommendations remain HHOT pending progress.    Recommendations for follow up therapy are one component of a multi-disciplinary discharge planning process, led by the attending physician.  Recommendations may be updated based on patient status, additional functional criteria and insurance authorization.    Follow Up Recommendations  Home health OT    Assistance Recommended at Discharge Intermittent Supervision/Assistance  Patient can return home with the following  A little help with walking and/or transfers;A little help with bathing/dressing/bathroom;Assistance with cooking/housework;Assist for transportation;Help with stairs or ramp for entrance   Equipment Recommendations  None recommended by OT    Recommendations for Other Services      Precautions / Restrictions Precautions Precautions: Fall Precaution Comments: NGT to suction       Mobility Bed Mobility               General bed mobility comments: oob in chair    Transfers                   General transfer comment: declined due to vomitting     Balance                                           ADL either performed or assessed with clinical judgement   ADL Overall ADL's : Needs assistance/impaired Eating/Feeding: Set up;Sitting    Grooming: Wash/dry hands;Wash/dry face;Min guard;Sitting   Upper Body Bathing: Minimal assistance;Sitting       Upper Body Dressing : Minimal assistance;Sitting                     General ADL Comments: up in chair vomiting on arrival attmepting to eat lunch. pt (A) with bathing, grooming and clothing change    Extremity/Trunk Assessment Upper Extremity Assessment Upper Extremity Assessment: Overall WFL for tasks assessed   Lower Extremity Assessment Lower Extremity Assessment: Defer to PT evaluation        Vision       Perception     Praxis      Cognition Arousal/Alertness: Awake/alert Behavior During Therapy: Flat affect Overall Cognitive Status: Within Functional Limits for tasks assessed                                 General Comments: nauseated and vomiting on arrival        Exercises      Shoulder Instructions       General Comments      Pertinent Vitals/ Pain       Pain Assessment Pain Assessment: Faces Faces Pain Scale: Hurts a little bit Pain Location: abdomen Pain Descriptors / Indicators: Discomfort Pain Intervention(s): Monitored  during session, Repositioned  Home Living                                          Prior Functioning/Environment              Frequency  Min 2X/week        Progress Toward Goals  OT Goals(current goals can now be found in the care plan section)  Progress towards OT goals: Progressing toward goals  Acute Rehab OT Goals Patient Stated Goal: to have liquid food OT Goal Formulation: With patient Time For Goal Achievement: 08/08/22 Potential to Achieve Goals: Good ADL Goals Pt Will Perform Grooming: with modified independence;standing Pt Will Perform Lower Body Dressing: with modified independence;sit to/from stand Pt Will Transfer to Toilet: with modified independence;ambulating;regular height toilet  Plan Discharge plan remains appropriate     Co-evaluation                 AM-PAC OT "6 Clicks" Daily Activity     Outcome Measure   Help from another person eating meals?: None Help from another person taking care of personal grooming?: A Little Help from another person toileting, which includes using toliet, bedpan, or urinal?: A Little Help from another person bathing (including washing, rinsing, drying)?: A Lot Help from another person to put on and taking off regular upper body clothing?: None Help from another person to put on and taking off regular lower body clothing?: A Lot 6 Click Score: 18    End of Session    OT Visit Diagnosis: Unsteadiness on feet (R26.81);Muscle weakness (generalized) (M62.81)   Activity Tolerance Treatment limited secondary to medical complications (Comment)   Patient Left in chair;with call bell/phone within reach;with chair alarm set;with family/visitor present   Nurse Communication Mobility status;Precautions        Time: 7741 [phone] OT Time Calculation (min): 18 min  Charges: OT General Charges $OT Visit: 1 Visit OT Treatments $Self Care/Home Management : 8-22 mins   Brynn, OTR/L  Acute Rehabilitation Services Office: [phone] .   Jeri Modena 07/29/2022, 1:59 PM

## 2022-07-29 NOTE — Progress Notes (Signed)
5 Days Post-Op  Subjective: CC: Pain controlled - does report abd pain with deep inspiration so she is taking shallow breaths. Reports one BM. Denies flatus. Reports belching. Reports decreased appetite. Denies fever, chills, nausea, vomiting.   Objective: Vital signs in last 24 hours: Temp:  [98 F (36.7 C)-99.8 F (37.7 C)] 99.8 F (37.7 C) (08/08 0728) Pulse Rate:  [82-99] 99 (08/08 0728) Resp:  [18-32] 18 (08/08 0728) BP: (119-153)/(79-89) 153/83 (08/08 0728) SpO2:  [94 %-100 %] 97 % (08/08 0318) Last BM Date : 07/29/22  Intake/Output from previous day: 08/07 0701 - 08/08 0700 In: 3528.2 [P.O.:480; I.V.:2048.2; IV Piggyback:1000] Out: -  Intake/Output this shift: No intake/output data recorded.  PE: Gen: Awake and alert, nad Lungs: Normal rate and effort Abd: Soft, mild upper abdominal distension, some ttp at the top of her incision/epigastrium but otherwise NT. No rigidity or guarding. +BS. Midline wound clean with healthy granulation tissue at the base, no dehiscence.   Lab Results:  Recent Labs    07/28/22 0145 07/29/22 0650  WBC 11.1* 14.5*  HGB 9.1* 9.4*  HCT 27.7* 28.0*  PLT 330 353   BMET Recent Labs    07/28/22 0145 07/29/22 0650  NA 139 139  K 3.3* 3.8  CL 108 110  CO2 23 21*  GLUCOSE 110* 113*  BUN 14 10  CREATININE 1.00 1.11*  CALCIUM 8.0* 8.0*   PT/INR No results for input(s): "LABPROT", "INR" in the last 72 hours. CMP     Component Value Date/Time   NA 139 07/29/2022 0650   K 3.8 07/29/2022 0650   CL 110 07/29/2022 0650   CO2 21 (L) 07/29/2022 0650   GLUCOSE 113 (H) 07/29/2022 0650   BUN 10 07/29/2022 0650   CREATININE 1.11 (H) 07/29/2022 0650   CREATININE 2.67 (H) 07/23/2022 1056   CALCIUM 8.0 (L) 07/29/2022 0650   PROT 8.2 (H) 07/23/2022 1917   ALBUMIN 4.5 07/23/2022 1917   AST 28 07/23/2022 1917   ALT 19 07/23/2022 1917   ALKPHOS 95 07/23/2022 1917   BILITOT 1.2 07/23/2022 1917   GFRNONAA 52 (L) 07/29/2022 0650    GFRNONAA 38 (L) 03/04/2021 1110   GFRAA 44 (L) 03/04/2021 1110   Lipase  No results found for: "LIPASE"  Studies/Results: No results found.  Anti-infectives: Anti-infectives (From admission, onward)    Start     Dose/Rate Route Frequency Ordered Stop   07/26/22 1400  ceFEPIme (MAXIPIME) 2 g in sodium chloride 0.9 % 100 mL IVPB        2 g 200 mL/hr over 30 Minutes Intravenous Every 12 hours 07/26/22 1322 07/29/22 2359   07/25/22 0500  ciprofloxacin (CIPRO) IVPB 400 mg  Status:  Discontinued        400 mg 200 mL/hr over 60 Minutes Intravenous Every 24 hours 07/24/22 1350 07/24/22 1444   07/24/22 2100  ceFEPIme (MAXIPIME) 1 g in sodium chloride 0.9 % 100 mL IVPB  Status:  Discontinued        1 g 200 mL/hr over 30 Minutes Intravenous Every 24 hours 07/23/22 2029 07/24/22 0658   07/24/22 2100  metroNIDAZOLE (FLAGYL) IVPB 500 mg        500 mg 100 mL/hr over 60 Minutes Intravenous Every 12 hours 07/24/22 0659 07/30/22 0159   07/24/22 2100  ceFEPIme (MAXIPIME) 1 g in sodium chloride 0.9 % 100 mL IVPB  Status:  Discontinued        1 g 200 mL/hr over 30 Minutes Intravenous  Every 24 hours 07/24/22 0658 07/26/22 1322   07/24/22 2000  ciprofloxacin (CIPRO) IVPB 400 mg  Status:  Discontinued        400 mg 200 mL/hr over 60 Minutes Intravenous Every 12 hours 07/24/22 0659 07/24/22 1350   07/24/22 0315  ciprofloxacin (CIPRO) IVPB 400 mg        400 mg 200 mL/hr over 60 Minutes Intravenous STAT 07/24/22 0311 07/24/22 0441   07/24/22 0315  metroNIDAZOLE (FLAGYL) IVPB 500 mg        500 mg 100 mL/hr over 60 Minutes Intravenous STAT 07/24/22 0311 07/24/22 0457   07/23/22 2000  ceFEPIme (MAXIPIME) 2 g in sodium chloride 0.9 % 100 mL IVPB        2 g 200 mL/hr over 30 Minutes Intravenous  Once 07/23/22 1957 07/23/22 2239   07/23/22 2000  metroNIDAZOLE (FLAGYL) IVPB 500 mg        500 mg 100 mL/hr over 60 Minutes Intravenous  Once 07/23/22 1957 07/23/22 2240        Assessment/Plan POD 5 Dx  Laparoscopy, exploratory laparotomy, SBR, primary repair of incisional hernia - Dr. Rosendo Gros, 07/24/2022 - SOFT. Boost breeze. Had a BM but still with decreased appetite and belching. Make npo if any n/v - Cont abx 5d post op (ok to stop today 2359)  - BID WTD midline - Mobilize, up with RN/Mobility tech/therapies - she needs to be oob much more. Rec for [facility] PT/OT - Pulm toilet    FEN - adv to soft. Add boost breeze IVF per primary  VTE - SCDs, Lovenox ID - Cefepime/Flagyl 8/2 >> Afebrile without tachycardia or hypotension; noted WBC increasing (14.6 from 11). Afebrile. HR WNL. Encourage incentive spirometry. No signs of wound infection. Does have some sxs of ileus. If WBC uptrending we will consider repeat CT scan tomorrow.     AKI on CKD3b  ABL anemia  HTN Chronic dCHF DM2 HLD Hypothyroidism OSA on CPAP   LOS: 5 days    Jill Alexanders , [facility] 07/29/2022, 9:56 AM Please see Amion for pager number during day hours 7:00am-4:30pm

## 2022-07-29 NOTE — Progress Notes (Signed)
  Mobility Specialist Criteria Algorithm Info.   07/29/22 1639  Mobility  Activity Refused mobility   Patient not feeling well. Episodes of n/v earlier this afternoon. Will f/u as time permits.   07/29/2022 4:39 PM  Jocelyn Camacho, [place], [place]  VOJJK:[phone] Office: [phone]

## 2022-07-30 ENCOUNTER — Inpatient Hospital Stay (HOSPITAL_COMMUNITY): Payer: Medicare Other

## 2022-07-30 DIAGNOSIS — K420 Umbilical hernia with obstruction, without gangrene: Secondary | ICD-10-CM | POA: Diagnosis not present

## 2022-07-30 LAB — BASIC METABOLIC PANEL
Anion gap: 6 (ref 5–15)
BUN: 9 mg/dL (ref 8–23)
CO2: 21 mmol/L — ABNORMAL LOW (ref 22–32)
Calcium: 8 mg/dL — ABNORMAL LOW (ref 8.9–10.3)
Chloride: 110 mmol/L (ref 98–111)
Creatinine, Ser: 1.02 mg/dL — ABNORMAL HIGH (ref 0.44–1.00)
GFR, Estimated: 57 mL/min — ABNORMAL LOW (ref >60)
Glucose, Bld: 126 mg/dL — ABNORMAL HIGH (ref 70–99)
Potassium: 3.6 mmol/L (ref 3.5–5.1)
Sodium: 137 mmol/L (ref 135–145)

## 2022-07-30 LAB — GLUCOSE, CAPILLARY
Glucose-Capillary: 94 mg/dL (ref 70–99)
Glucose-Capillary: 166 mg/dL — ABNORMAL HIGH (ref 70–99)
Glucose-Capillary: 116 mg/dL — ABNORMAL HIGH (ref 70–99)
Glucose-Capillary: 139 mg/dL — ABNORMAL HIGH (ref 70–99)

## 2022-07-30 LAB — CBC
HCT: 28.2 % — ABNORMAL LOW (ref 36.0–46.0)
Hemoglobin: 9.3 g/dL — ABNORMAL LOW (ref 12.0–15.0)
MCH: 29.2 pg (ref 26.0–34.0)
MCHC: 33 g/dL (ref 30.0–36.0)
MCV: 88.4 fL (ref 80.0–100.0)
Platelets: 344 10*3/uL (ref 150–400)
RBC: 3.19 MIL/uL — ABNORMAL LOW (ref 3.87–5.11)
RDW: 14.5 % (ref 11.5–15.5)
WBC: 16.5 10*3/uL — ABNORMAL HIGH (ref 4.0–10.5)
nRBC: 0.4 % — ABNORMAL HIGH (ref 0.0–0.2)

## 2022-07-30 MED ORDER — IOHEXOL 9 MG/ML PO SOLN
ORAL | Status: AC
  Administered 2022-07-30: 500 mL
  Filled 2022-07-30: qty 1000

## 2022-07-30 MED ORDER — IOHEXOL 300 MG/ML  SOLN
80.0000 mL | Freq: Once | INTRAMUSCULAR | Status: AC | PRN
  Administered 2022-07-30: 80 mL via INTRAVENOUS

## 2022-07-30 MED ORDER — MEDIHONEY WOUND/BURN DRESSING EX PSTE
1.0000 | PASTE | Freq: Every day | CUTANEOUS | Status: DC
  Administered 2022-07-30 – 2022-08-01 (×3): 1 via TOPICAL
  Filled 2022-07-30: qty 44

## 2022-07-30 NOTE — Consult Note (Signed)
WOC Nurse Consult Note: CHF exacerbation, fluid retention and re-opening of wound to left lateral lower leg.  Scarring from previous , much larger wound, center is now nonintact and weeping serous effluent.  She states her legs are not significantly more swollen than normal.  Fluid gathered around malleolus, generalized nonpitting edema to feet and lower legs.  Was seen at wound care center for this wound and was trying to get followup appointment with them when she became ill.   Family at bedside.  Patient and family informed to follow up with wound care center after discharge.  Reason for Consult: Recurrence nonintact lesion to left lateral leg, seen by wound care center.  Wound type: recurrence of existing wound Pressure Injury POA: NA Measurement: scar is 5 cm x 4 cm with 0.3 cm nonintact center.   Wound bed: red and moist with serous fluid weeping Drainage (amount, consistency, odor) minimal serous weeping Periwound:scarring  Dressing procedure/placement/frequency: Cleanse wound on left lower leg with NS and pat dry. Then, apply a nickel thick layer of MediHoney directly to the wound or onto a dressing. Make certain that the dressing covers the entire wound base, but not in contact with the peri-wound skin (scar) and secure with gauze and silicone foam. Change dressing daily.  Will not follow at this time.  Please re-consult if needed.  Domenic Moras MSN, RN, FNP-BC CWON Wound, Ostomy, Continence Nurse Pager [phone]

## 2022-07-30 NOTE — Progress Notes (Signed)
6 Days Post-Op  Subjective: CC: Patients family at bedside. Patient reports she was eating soft diet for lunch yesterday and had an episode of vomiting. No n/v since that time and was able to tolerate cld tray for breakfast. She denies any abdominal pain at rest but has some ttp in the epigastrium with palpation. Small bm yesterday night. Voiding without dysuria or hematuria. She denies cp, sob, or cough but has been having to clear her throat often. Mobilizing with therapies.   Objective: Vital signs in last 24 hours: Temp:  [98 F (36.7 C)-98.2 F (36.8 C)] 98.2 F (36.8 C) (08/09 0536) Pulse Rate:  [75-96] 75 (08/09 0536) Resp:  [19-20] 20 (08/09 0536) BP: (123-147)/(67-78) 132/78 (08/09 0536) SpO2:  [94 %-96 %] 96 % (08/09 0536) Last BM Date : 07/29/22  Intake/Output from previous day: 08/08 0701 - 08/09 0700 In: 636.2 [I.V.:636.2] Out: -  Intake/Output this shift: Total I/O In: 360 [P.O.:360] Out: -   PE: Gen:  Alert, NAD, pleasant Card:  Reg Pulm:  CTAB, no W/R/R, effort normal Abd: Soft, mild upper abdominal distension, some ttp at the top of her incision/epigastrium but otherwise NT. No rigidity or guarding. +BS. Midline wound clean with healthy granulation tissue at the base, no dehiscence.  Ext:  No LE edema Psych: A&Ox3     Lab Results:  Recent Labs    07/29/22 0650 07/30/22 0207  WBC 14.5* 16.5*  HGB 9.4* 9.3*  HCT 28.0* 28.2*  PLT 353 344   BMET Recent Labs    07/29/22 0650 07/30/22 0207  NA 139 137  K 3.8 3.6  CL 110 110  CO2 21* 21*  GLUCOSE 113* 126*  BUN 10 9  CREATININE 1.11* 1.02*  CALCIUM 8.0* 8.0*   PT/INR No results for input(s): "LABPROT", "INR" in the last 72 hours. CMP     Component Value Date/Time   NA 137 07/30/2022 0207   K 3.6 07/30/2022 0207   CL 110 07/30/2022 0207   CO2 21 (L) 07/30/2022 0207   GLUCOSE 126 (H) 07/30/2022 0207   BUN 9 07/30/2022 0207   CREATININE 1.02 (H) 07/30/2022 0207   CREATININE 2.67 (H)  07/23/2022 1056   CALCIUM 8.0 (L) 07/30/2022 0207   PROT 8.2 (H) 07/23/2022 1917   ALBUMIN 4.5 07/23/2022 1917   AST 28 07/23/2022 1917   ALT 19 07/23/2022 1917   ALKPHOS 95 07/23/2022 1917   BILITOT 1.2 07/23/2022 1917   GFRNONAA 57 (L) 07/30/2022 0207   GFRNONAA 38 (L) 03/04/2021 1110   GFRAA 44 (L) 03/04/2021 1110   Lipase  No results found for: "LIPASE"  Studies/Results: No results found.  Anti-infectives: Anti-infectives (From admission, onward)    Start     Dose/Rate Route Frequency Ordered Stop   07/26/22 1400  ceFEPIme (MAXIPIME) 2 g in sodium chloride 0.9 % 100 mL IVPB        2 g 200 mL/hr over 30 Minutes Intravenous Every 12 hours 07/26/22 1322 07/29/22 1919   07/25/22 0500  ciprofloxacin (CIPRO) IVPB 400 mg  Status:  Discontinued        400 mg 200 mL/hr over 60 Minutes Intravenous Every 24 hours 07/24/22 1350 07/24/22 1444   07/24/22 2100  ceFEPIme (MAXIPIME) 1 g in sodium chloride 0.9 % 100 mL IVPB  Status:  Discontinued        1 g 200 mL/hr over 30 Minutes Intravenous Every 24 hours 07/23/22 2029 07/24/22 0658   07/24/22 2100  metroNIDAZOLE (  FLAGYL) IVPB 500 mg        500 mg 100 mL/hr over 60 Minutes Intravenous Every 12 hours 07/24/22 0659 07/29/22 1823   07/24/22 2100  ceFEPIme (MAXIPIME) 1 g in sodium chloride 0.9 % 100 mL IVPB  Status:  Discontinued        1 g 200 mL/hr over 30 Minutes Intravenous Every 24 hours 07/24/22 0658 07/26/22 1322   07/24/22 2000  ciprofloxacin (CIPRO) IVPB 400 mg  Status:  Discontinued        400 mg 200 mL/hr over 60 Minutes Intravenous Every 12 hours 07/24/22 0659 07/24/22 1350   07/24/22 0315  ciprofloxacin (CIPRO) IVPB 400 mg        400 mg 200 mL/hr over 60 Minutes Intravenous STAT 07/24/22 0311 07/24/22 0441   07/24/22 0315  metroNIDAZOLE (FLAGYL) IVPB 500 mg        500 mg 100 mL/hr over 60 Minutes Intravenous STAT 07/24/22 0311 07/24/22 0457   07/23/22 2000  ceFEPIme (MAXIPIME) 2 g in sodium chloride 0.9 % 100 mL IVPB         2 g 200 mL/hr over 30 Minutes Intravenous  Once 07/23/22 1957 07/23/22 2239   07/23/22 2000  metroNIDAZOLE (FLAGYL) IVPB 500 mg        500 mg 100 mL/hr over 60 Minutes Intravenous  Once 07/23/22 1957 07/23/22 2240        Assessment/Plan POD 6 Dx Laparoscopy, exploratory laparotomy, SBR, primary repair of incisional hernia - Dr. Rosendo Gros, 07/24/2022 - CT A/P today  - Will keep on liquids for now with episode of n/v yesterday when on soft diet. She is tolerating liquids right now.  - Completed 5d post op abx - BID WTD midline - Mobilize, up with RN/Mobility tech/therapies - Rec for [facility] PT/OT - Pulm toilet    FEN - Keep on FLD for now VTE - SCDs, Lovenox ID - Cefepime/Flagyl 8/2 - 8/8. WBC up   AKI on CKD3b - Cr improving ABL anemia - stable HTN Chronic dCHF DM2 HLD Hypothyroidism OSA on CPAP   LOS: 6 days    Jocelyn Camacho , [facility] 07/30/2022, 11:01 AM Please see Amion for pager number during day hours 7:00am-4:30pm

## 2022-07-30 NOTE — Progress Notes (Signed)
Physical Therapy Treatment Patient Details Name: Jocelyn Camacho MRN: [id number] DOB: [birth year] Today's Date: 07/30/2022   History of Present Illness 76 y/o female who presents to [facility] on 07/23/22 due to abdominal pain, N/V. Found to have SBO and incarcerated hernia, transferred to [facility] and now s/p exp laparatomy, small bowel resection and hernia repair 07/24/22. PMH includes HTN, anxiety, DM, OSA, obesity.    PT Comments    Pt admitted with above diagnosis. Pt progressing with ambulation with RW with good safety with device. Feels that she will need a RW for home and needs a youth RW therefore updated recommendations.  Pt able to perform some exercises as well.  Pt does state she doesn't want PT f/u and this PT agrees.  Will continue to follow pt acutely to meet goals.  Pt currently with functional limitations due to balance and endurance deficits.  Pt will benefit from skilled PT to increase their independence and safety with mobility to allow discharge to the venue listed below.      Recommendations for follow up therapy are one component of a multi-disciplinary discharge planning process, led by the attending physician.  Recommendations may be updated based on patient status, additional functional criteria and insurance authorization.  Follow Up Recommendations  No PT follow up     Assistance Recommended at Discharge Intermittent Supervision/Assistance  Patient can return home with the following A little help with walking and/or transfers;A little help with bathing/dressing/bathroom;Help with stairs or ramp for entrance;Assist for transportation;Assistance with cooking/housework   Equipment Recommendations  BSC/3in1 (Needs youth RW)    Recommendations for Other Services       Precautions / Restrictions Precautions Precautions: Fall Restrictions Weight Bearing Restrictions: No     Mobility  Bed Mobility Overal bed mobility: Needs Assistance Bed Mobility: Supine to Sit, Sit to  Supine Rolling: Min guard Sidelying to sit: Min guard     Sit to sidelying: Min assist General bed mobility comments: HOB elevated, light use of bedrails. has adjustable mattress at home; needed assist only to get LEs back into bed    Transfers Overall transfer level: Needs assistance Equipment used: Rolling walker (2 wheels) Transfers: Sit to/from Stand Sit to Stand: Supervision, Min guard           General transfer comment: increased time/effort though no assist needed, minor cues for hand placement    Ambulation/Gait Ambulation/Gait assistance: Min guard, Supervision Gait Distance (Feet): 300 Feet Assistive device: Rolling walker (2 wheels) Gait Pattern/deviations: Step-through pattern, Decreased stride length Gait velocity: decreased Gait velocity interpretation: <1.31 ft/sec, indicative of household ambulator   General Gait Details: encouraged patient to continue using rolling walker for safety with ambulation.  mild abdominal discomfort reported while mobilizing. Pt very safe with RW.   Stairs             Wheelchair Mobility    Modified Rankin (Stroke Patients Only)       Balance Overall balance assessment: Needs assistance Sitting-balance support: Feet supported Sitting balance-Leahy Scale: Good     Standing balance support: Bilateral upper extremity supported, During functional activity Standing balance-Leahy Scale: Poor Standing balance comment: relies on RW for suppot.                            Cognition Arousal/Alertness: Awake/alert Behavior During Therapy: WFL for tasks assessed/performed Overall Cognitive Status: Within Functional Limits for tasks assessed  Exercises General Exercises - Lower Extremity Ankle Circles/Pumps: AROM, Both, 10 reps, Supine Long Arc Quad: AROM, Both, 10 reps, Seated Hip Flexion/Marching: AROM, Both, 10 reps, Seated    General Comments  General comments (skin integrity, edema, etc.): VSS, sister came in durign treatment      Pertinent Vitals/Pain Pain Assessment Pain Assessment: Faces Faces Pain Scale: Hurts a little bit Pain Location: abdomen Pain Descriptors / Indicators: Discomfort Pain Intervention(s): Limited activity within patient's tolerance, Monitored during session, Repositioned    Home Living                          Prior Function            PT Goals (current goals can now be found in the care plan section) Acute Rehab PT Goals Patient Stated Goal: return to independence Progress towards PT goals: Progressing toward goals    Frequency    Min 3X/week      PT Plan Discharge plan needs to be updated;Equipment recommendations need to be updated    Co-evaluation              AM-PAC PT "6 Clicks" Mobility   Outcome Measure  Help needed turning from your back to your side while in a flat bed without using bedrails?: A Little Help needed moving from lying on your back to sitting on the side of a flat bed without using bedrails?: A Little Help needed moving to and from a bed to a chair (including a wheelchair)?: A Little Help needed standing up from a chair using your arms (e.g., wheelchair or bedside chair)?: A Little Help needed to walk in hospital room?: A Little Help needed climbing 3-5 steps with a railing? : A Little 6 Click Score: 18    End of Session Equipment Utilized During Treatment: Gait belt Activity Tolerance: Patient tolerated treatment well Patient left: with call bell/phone within reach;with family/visitor present;in bed;with bed alarm set Nurse Communication: Mobility status PT Visit Diagnosis: Pain;Muscle weakness (generalized) (M62.81);Difficulty in walking, not elsewhere classified (R26.2) Pain - part of body:  (abdomen)     Time: 9147-8295 PT Time Calculation (min) (ACUTE ONLY): 28 min  Charges:  $Gait Training: 8-22 mins $Therapeutic Exercise: 8-22  mins                     Giovany Cosby M,PT Acute Rehab Services [phone]    Bevelyn Buckles 07/30/2022, 2:03 PM

## 2022-07-30 NOTE — Plan of Care (Signed)

## 2022-07-30 NOTE — Progress Notes (Signed)
Pt refused to wear CPAP to sleep. Pt knows to call me if she changes her mind and decides to wear it.

## 2022-07-30 NOTE — Progress Notes (Signed)
Occupational Therapy Treatment Patient Details Name: Jocelyn Camacho MRN: [id number] DOB: 1945-12-26 Today's Date: 07/30/2022   History of present illness 76 y/o female who presents to [place] on 07/23/22 due to abdominal pain, N/V. Found to have SBO and incarcerated hernia, transferred to [facility] and now s/p exp laparatomy, small bowel resection and hernia repair 07/24/22. PMH includes HTN, anxiety, DM, OSA, obesity.   OT comments  Pt progressing well towards OT goals, denies abdominal pain or nausea during session. Pt able to mobilize to/from bathroom, perform toileting task and ADLs standing at sink with no more than min guard. Educated re: strategies for LB dressing (pt able to demo figure four position well), DME use at home, where assistance may be needed and bed mobility strategies at home (has adjustable mattress/bed). Continue to rec HHOT.   Recommendations for follow up therapy are one component of a multi-disciplinary discharge planning process, led by the attending physician.  Recommendations may be updated based on patient status, additional functional criteria and insurance authorization.    Follow Up Recommendations  Home health OT    Assistance Recommended at Discharge Intermittent Supervision/Assistance  Patient can return home with the following  A little help with walking and/or transfers;A little help with bathing/dressing/bathroom;Assistance with cooking/housework;Assist for transportation;Help with stairs or ramp for entrance   Equipment Recommendations  Other (comment) (RW)    Recommendations for Other Services      Precautions / Restrictions Precautions Precautions: Fall Restrictions Weight Bearing Restrictions: No       Mobility Bed Mobility Overal bed mobility: Needs Assistance Bed Mobility: Supine to Sit, Sit to Supine     Supine to sit: Modified independent (Device/Increase time) Sit to supine: Modified independent (Device/Increase time)   General bed  mobility comments: HOB elevated, light use of bedrails. has adjustable mattress at home    Transfers Overall transfer level: Needs assistance Equipment used: Rolling walker (2 wheels) Transfers: Sit to/from Stand Sit to Stand: Supervision           General transfer comment: increased time/effort though no assist needed, minor cues for hand placement     Balance Overall balance assessment: Needs assistance Sitting-balance support: Feet supported Sitting balance-Leahy Scale: Good     Standing balance support: Bilateral upper extremity supported, During functional activity Standing balance-Leahy Scale: Fair Standing balance comment: able to stand at sink without RW though leans on sink for support (likely for comfort)                           ADL either performed or assessed with clinical judgement   ADL Overall ADL's : Needs assistance/impaired     Grooming: Supervision/safety;Wash/dry face;Standing               Lower Body Dressing: Minimal assistance;Sit to/from stand Lower Body Dressing Details (indicate cue type and reason): able to cross LEs sitting EOB to reach socks. will likely need assist to coordinate around feet but improving well Toilet Transfer: Min guard;Ambulation;Rolling walker (2 wheels) Toilet Transfer Details (indicate cue type and reason): able to pull to stand with grab bar Toileting- Clothing Manipulation and Hygiene: Supervision/safety;Sit to/from stand Toileting - Clothing Manipulation Details (indicate cue type and reason): able to perform hygiene in standing without assist       General ADL Comments: Denies pain or nausea, moving slow but less physical assistance    Extremity/Trunk Assessment Upper Extremity Assessment Upper Extremity Assessment: Overall WFL for tasks assessed  Lower Extremity Assessment Lower Extremity Assessment: Defer to PT evaluation        Vision   Vision Assessment?: No apparent visual deficits    Perception     Praxis      Cognition Arousal/Alertness: Awake/alert Behavior During Therapy: WFL for tasks assessed/performed Overall Cognitive Status: Within Functional Limits for tasks assessed                                          Exercises      Shoulder Instructions       General Comments VSS on RA. Family present during session    Pertinent Vitals/ Pain       Pain Assessment Pain Assessment: 0-10 Pain Score: 1  Pain Location: abdomen Pain Descriptors / Indicators: Discomfort Pain Intervention(s): Monitored during session  Home Living                                          Prior Functioning/Environment              Frequency  Min 2X/week        Progress Toward Goals  OT Goals(current goals can now be found in the care plan section)  Progress towards OT goals: Progressing toward goals  Acute Rehab OT Goals Patient Stated Goal: home soon OT Goal Formulation: With patient Time For Goal Achievement: 08/08/22 Potential to Achieve Goals: Good ADL Goals Pt Will Perform Grooming: with modified independence;standing Pt Will Perform Lower Body Dressing: with modified independence;sit to/from stand Pt Will Transfer to Toilet: with modified independence;ambulating;regular height toilet  Plan Discharge plan remains appropriate    Co-evaluation                 AM-PAC OT "6 Clicks" Daily Activity     Outcome Measure   Help from another person eating meals?: None Help from another person taking care of personal grooming?: A Little Help from another person toileting, which includes using toliet, bedpan, or urinal?: A Little Help from another person bathing (including washing, rinsing, drying)?: A Little Help from another person to put on and taking off regular upper body clothing?: None Help from another person to put on and taking off regular lower body clothing?: A Little 6 Click Score: 20    End of  Session Equipment Utilized During Treatment: Rolling walker (2 wheels)  OT Visit Diagnosis: Unsteadiness on feet (R26.81);Muscle weakness (generalized) (M62.81)   Activity Tolerance Patient tolerated treatment well   Patient Left in bed;with call bell/phone within reach;with bed alarm set;with family/visitor present   Nurse Communication Mobility status        Time: 8469-6295 OT Time Calculation (min): 30 min  Charges: OT General Charges $OT Visit: 1 Visit OT Treatments $Self Care/Home Management : 23-37 mins  Malachy Chamber, OTR/L Acute Rehab Services Office: [phone]   Layla Maw 07/30/2022, 9:38 AM

## 2022-07-30 NOTE — Progress Notes (Signed)
Progress Note Patient: Jocelyn Camacho KGU:[id number] DOB: [birth year] DOA: 07/23/2022  DOS: the patient was seen and examined on 07/30/2022  Brief hospital course: PMH of type II DM, HTN, HLD, chronic HFpEF, CKD 3B, OSA, obesity presented to the hospital with complaints of abdominal pain nausea and vomiting and found to have incarcerated hernia with ischemic bowel.  SP small bowel resection and hernia repair on 07/24/2022. Now monitoring for postop recovery. Assessment and Plan: Incarcerated hernia with ischemic bowel  infraumbilical hernia containing a small bowel loop with incarceration of proximal obstruction. S/P exploratory laparotomy with small bowel resection and hernia repair by Dr. Rosendo Gros 07/24/2022. Pathology with ischemic necrosis transmural, mesenteric venous thrombi no edematous change or malignancy identified Management per general surgery. Completed Cefepime and metronidazole   Acute renal failure on CKD stage IIIb: Resolved Patient presenting with a creatinine of 2.67 with peak of 3.76 during hospitalization.   Baseline creatinine 1.4-1.6.   Etiology likely secondary to prerenal azotemia in the setting of nausea and vomiting from bowel obstruction as above.   Acute postoperative blood loss anemia Hemoglobin 13.0 on admission, postoperatively down to 9.4; stable.. Transfuse for hemoglobin less than 7.0; or active bleeding   Essential hypertension Chronic diastolic congestive heart failure, compensated Home regimen includes atenolol 100 mg p.o. daily furosemide 40 mg p.o. daily, losartan 100 mg p.o. daily, Finerenone 10 mg p.o. daily. --Continue to hold losartan, furosemide, finerenone for now   Type 2 diabetes mellitus controlled, with long-term insulin use without any complication Hemoglobin S2G 5.2, well controlled.  Home regimen includes NPH 20 units every morning, 5 units every afternoon, Trulicity 3 mg weekly. --SSI for coverage --CBGs TIDAC   Hypothyroidism On  levothyroxine 125 mcg p.o. daily at home.  TSH within normal limits. --Restart levothyroxine   Hyperlipidemia: Holding statin for now   Gout: Holding allopurinol until further advancement of diet   Glaucoma: Continue eyedrops   OSA --Continue nocturnal CPAP   Weakness/debility/deconditioning: --PT/OT recommending home health on discharge, continue therapy while inpatient   Morbid obesity Body mass index is 41.88 kg/m.  Discussed with patient needs for aggressive lifestyle changes/weight loss as this complicates all facets of care.  Outpatient follow-up with PCP.    Subjective: Abdominal pain improving.  No nausea no vomiting.  Passing gas but no BM.  Minimal oral intake.  No shortness of breath.  Physical Exam: Vitals:   07/29/22 0728 07/29/22 1509 07/29/22 2037 07/30/22 0536  BP: (!) 153/83 (!) 147/67 123/68 132/78  Pulse: 99 96 81 75  Resp: 18 19 20 20   Temp: 99.8 F (37.7 C)  98 F (36.7 C) 98.2 F (36.8 C)  TempSrc:      SpO2:  94% 96% 96%  Weight:      Height:       General: Appear in mild distress; no visible Abnormal Neck Mass Or lumps, Conjunctiva normal Cardiovascular: S1 and S2 Present, no Murmur, Respiratory: good respiratory effort, Bilateral Air entry present and faint basal crackles, no wheezes Abdomen: Bowel Sound present, diffuse tenderness Extremities: Chronic trace pedal edema Neurology: alert and oriented to time, place, and person  Gait not checked due to patient safety concerns   Data Reviewed: I have Reviewed nursing notes, Vitals, and Lab results since pt's last encounter. Pertinent lab results CBC and BMP I have ordered test including CBC and BMP    Family Communication: Family at bedside  Disposition: Status is: Inpatient Remains inpatient appropriate because: Need for improvement in diet tolerance. Author: Diamantina Providence  Posey Pronto, MD 07/30/2022 6:46 PM  Please look on [email or web address] to find out who is on call.

## 2022-07-30 NOTE — Hospital Course (Signed)
PMH of type II DM, HTN, HLD, chronic HFpEF, CKD 3B, OSA, obesity presented to the hospital with complaints of abdominal pain nausea and vomiting and found to have incarcerated hernia with ischemic bowel.  SP small bowel resection and hernia repair on 07/24/2022. Now monitoring for postop recovery.

## 2022-07-31 LAB — BASIC METABOLIC PANEL
Anion gap: 5 (ref 5–15)
BUN: 6 mg/dL — ABNORMAL LOW (ref 8–23)
CO2: 23 mmol/L (ref 22–32)
Calcium: 7.9 mg/dL — ABNORMAL LOW (ref 8.9–10.3)
Chloride: 108 mmol/L (ref 98–111)
Creatinine, Ser: 1.05 mg/dL — ABNORMAL HIGH (ref 0.44–1.00)
GFR, Estimated: 55 mL/min — ABNORMAL LOW (ref >60)
Glucose, Bld: 154 mg/dL — ABNORMAL HIGH (ref 70–99)
Potassium: 3.6 mmol/L (ref 3.5–5.1)
Sodium: 136 mmol/L (ref 135–145)

## 2022-07-31 LAB — CBC WITH DIFFERENTIAL/PLATELET
Abs Immature Granulocytes: 0.3 10*3/uL — ABNORMAL HIGH (ref 0.00–0.07)
Basophils Absolute: 0.2 10*3/uL — ABNORMAL HIGH (ref 0.0–0.1)
Basophils Relative: 1 %
Eosinophils Absolute: 0.2 10*3/uL (ref 0.0–0.5)
Eosinophils Relative: 1 %
HCT: 27.5 % — ABNORMAL LOW (ref 36.0–46.0)
Hemoglobin: 8.8 g/dL — ABNORMAL LOW (ref 12.0–15.0)
Lymphocytes Relative: 18 %
Lymphs Abs: 2.8 10*3/uL (ref 0.7–4.0)
MCH: 29 pg (ref 26.0–34.0)
MCHC: 32 g/dL (ref 30.0–36.0)
MCV: 90.8 fL (ref 80.0–100.0)
Metamyelocytes Relative: 2 %
Monocytes Absolute: 0.2 10*3/uL (ref 0.1–1.0)
Monocytes Relative: 1 %
Neutro Abs: 12.2 10*3/uL — ABNORMAL HIGH (ref 1.7–7.7)
Neutrophils Relative %: 77 %
Platelets: 352 10*3/uL (ref 150–400)
RBC: 3.03 MIL/uL — ABNORMAL LOW (ref 3.87–5.11)
RDW: 14.6 % (ref 11.5–15.5)
WBC: 15.8 10*3/uL — ABNORMAL HIGH (ref 4.0–10.5)
nRBC: 0 /100 WBC
nRBC: 0.1 % (ref 0.0–0.2)

## 2022-07-31 LAB — MAGNESIUM: Magnesium: 1.7 mg/dL (ref 1.7–2.4)

## 2022-07-31 LAB — GLUCOSE, CAPILLARY
Glucose-Capillary: 129 mg/dL — ABNORMAL HIGH (ref 70–99)
Glucose-Capillary: 192 mg/dL — ABNORMAL HIGH (ref 70–99)
Glucose-Capillary: 151 mg/dL — ABNORMAL HIGH (ref 70–99)
Glucose-Capillary: 178 mg/dL — ABNORMAL HIGH (ref 70–99)

## 2022-07-31 NOTE — Progress Notes (Signed)
Progress Note Patient: Jocelyn Camacho TMH:[id number] DOB: 1946/10/21 DOA: 07/23/2022  DOS: the patient was seen and examined on 07/31/2022  Brief hospital course: PMH of type II DM, HTN, HLD, chronic HFpEF, CKD 3B, OSA, obesity presented to the hospital with complaints of abdominal pain nausea and vomiting and found to have incarcerated hernia with ischemic bowel.  SP small bowel resection and hernia repair on 07/24/2022. Now monitoring for postop recovery. Assessment and Plan: Incarcerated hernia with ischemic bowel  infraumbilical hernia containing a small bowel loop with incarceration of proximal obstruction. S/P exploratory laparotomy with small bowel resection and hernia repair by Dr. Rosendo Gros 07/24/2022. Pathology with ischemic necrosis transmural, mesenteric venous thrombi no edematous change or malignancy identified Management per general surgery. Completed Cefepime and metronidazole CT abdomen shows expected postop changes. Per surgery diet advanced and plan is to go home tomorrow as long as remains stable.   Acute renal failure on CKD stage IIIb: Resolved Patient presenting with a creatinine of 2.67 with peak of 3.76 during hospitalization.   Baseline creatinine 1.4-1.6.   Etiology likely secondary to prerenal azotemia in the setting of nausea and vomiting from bowel obstruction as above.   Acute postoperative blood loss anemia Hemoglobin 13.0 on admission, postoperatively down to 9.4; stable.. Transfuse for hemoglobin less than 7.0; or active bleeding   Essential hypertension Chronic diastolic congestive heart failure, compensated Home regimen includes atenolol 100 mg p.o. daily furosemide 40 mg p.o. daily, losartan 100 mg p.o. daily, Finerenone 10 mg p.o. daily. --Continue to hold losartan, furosemide, finerenone for now   Type 2 diabetes mellitus controlled, with long-term insulin use without any complication Hemoglobin X2J 5.2, well controlled.  Home regimen includes NPH 20 units  every morning, 5 units every afternoon, Trulicity 3 mg weekly. --SSI for coverage --CBGs TIDAC   Hypothyroidism On levothyroxine 125 mcg p.o. daily at home.  TSH within normal limits. --Restart levothyroxine   Hyperlipidemia: Holding statin for now   Gout: Holding allopurinol until further advancement of diet   Glaucoma: Continue eyedrops   OSA --Continue nocturnal CPAP   Weakness/debility/deconditioning: --PT/OT recommending home health on discharge, continue therapy while inpatient   Morbid obesity Body mass index is 41.88 kg/m.  Discussed with patient needs for aggressive lifestyle changes/weight loss as this complicates all facets of care.  Outpatient follow-up with PCP.    Subjective: Improving abdominal pain.  No nausea no vomiting but improving oral intake.  Passing gas.  Had a bowel movement.  Physical Exam: Vitals:   07/31/22 0300 07/31/22 0734 07/31/22 1230 07/31/22 2011  BP: 125/68 118/69 111/68 125/64  Pulse: 71 74 73   Resp: (!) 21 18 18    Temp: 98.6 F (37 C) 98.3 F (36.8 C) 98 F (36.7 C) 98.4 F (36.9 C)  TempSrc: Oral   Oral  SpO2: 97% 98% 96%   Weight:      Height:       General: Appear in mild distress; no visible Abnormal Neck Mass Or lumps, Conjunctiva normal Cardiovascular: S1 and S2 Present, no Murmur, Respiratory: good respiratory effort, Bilateral Air entry present and CTA, no Crackles, no wheezes Abdomen: Bowel Sound present, tender at the surgical site Extremities: no Pedal edema Neurology: alert and oriented to time, place, and person  Data Reviewed: I have Reviewed nursing notes, Vitals, and Lab results since pt's last encounter. Pertinent lab results CBC and BMP I have ordered test including CBC and BMP I have discussed pt's care plan and test results with general surgery.  Family Communication: Husband at bedside  Disposition: Status is: Inpatient Remains inpatient appropriate because: Monitoring for stability of diet  tolerance overnight.  Author: Berle Mull, MD 07/31/2022 8:18 PM  Please look on [email or web address] to find out who is on call.

## 2022-07-31 NOTE — Progress Notes (Signed)
7 Days Post-Op  Subjective: No new complaints today.  Feeling well.  Having some stool each time she voids, but last bigger BM was Tuesday.  Tolerating FLD.  Mobilized with therapy yesterday.  Objective: Vital signs in last 24 hours: Temp:  [98.3 F (36.8 C)-98.8 F (37.1 C)] 98.3 F (36.8 C) (08/10 0734) Pulse Rate:  [71-103] 74 (08/10 0734) Resp:  [18-23] 18 (08/10 0734) BP: (117-145)/(62-77) 118/69 (08/10 0734) SpO2:  [95 %-100 %] 98 % (08/10 0734) Last BM Date : 07/29/22  Intake/Output from previous day: 08/09 0701 - 08/10 0700 In: 360 [P.O.:360] Out: -  Intake/Output this shift: No intake/output data recorded.  PE: Gen:  Alert, NAD, pleasant Abd: Soft, mild upper abdominal distension, appropriately tender. +BS. Midline wound clean with healthy granulation tissue at the base, no dehiscence. small pocket of air and serosang fluid popped into at the middle to lower portion of the wound.  It was then repacked   Lab Results:  Recent Labs    07/30/22 0207 07/31/22 0209  WBC 16.5* 15.8*  HGB 9.3* 8.8*  HCT 28.2* 27.5*  PLT 344 352   BMET Recent Labs    07/30/22 0207 07/31/22 0209  NA 137 136  K 3.6 3.6  CL 110 108  CO2 21* 23  GLUCOSE 126* 154*  BUN 9 6*  CREATININE 1.02* 1.05*  CALCIUM 8.0* 7.9*   PT/INR No results for input(s): "LABPROT", "INR" in the last 72 hours. CMP     Component Value Date/Time   NA 136 07/31/2022 0209   K 3.6 07/31/2022 0209   CL 108 07/31/2022 0209   CO2 23 07/31/2022 0209   GLUCOSE 154 (H) 07/31/2022 0209   BUN 6 (L) 07/31/2022 0209   CREATININE 1.05 (H) 07/31/2022 0209   CREATININE 2.67 (H) 07/23/2022 1056   CALCIUM 7.9 (L) 07/31/2022 0209   PROT 8.2 (H) 07/23/2022 1917   ALBUMIN 4.5 07/23/2022 1917   AST 28 07/23/2022 1917   ALT 19 07/23/2022 1917   ALKPHOS 95 07/23/2022 1917   BILITOT 1.2 07/23/2022 1917   GFRNONAA 55 (L) 07/31/2022 0209   GFRNONAA 38 (L) 03/04/2021 1110   GFRAA 44 (L) 03/04/2021 1110    Lipase  No results found for: "LIPASE"  Studies/Results: CT ABDOMEN PELVIS W CONTRAST  Result Date: 07/30/2022 CLINICAL DATA:  Abdominal pain, post-op exploratory laparotomy and repair of umbilical hernia EXAM: CT ABDOMEN AND PELVIS WITH CONTRAST TECHNIQUE: Multidetector CT imaging of the abdomen and pelvis was performed using the standard protocol following bolus administration of intravenous contrast. RADIATION DOSE REDUCTION: This exam was performed according to the departmental dose-optimization program which includes automated exposure control, adjustment of the mA and/or kV according to patient size and/or use of iterative reconstruction technique. CONTRAST:  36mL OMNIPAQUE IOHEXOL 300 MG/ML  SOLN COMPARISON:  07/23/2022 FINDINGS: Lower chest: Small bilateral pleural effusions. Airspace opacities in both lower lobes could reflect atelectasis or pneumonia. Coronary artery calcifications in the visualized left anterior descending coronary artery. Hepatobiliary: Small calcification noted along the wall of the gallbladder in the fundus is stable since prior study this could reflect wall calcification (favored) or small adherent stone. No focal hepatic abnormality. Pancreas: No focal abnormality or ductal dilatation. Spleen: No focal abnormality.  Normal size. Adrenals/Urinary Tract: Subcentimeter cyst in the midpole of the left kidney is unchanged and appears simple. No follow-up imaging recommended. Adrenal glands unremarkable. No stones or hydronephrosis. Urinary bladder unremarkable. Stomach/Bowel: Stomach, large and small bowel grossly unremarkable. Vascular/Lymphatic:  Aortic atherosclerosis. No evidence of aneurysm or adenopathy. Reproductive: Uterus and adnexa unremarkable.  No mass. Other: No free fluid or free air. Open midline incision noted contains small gas and fluid collection in the subcutaneous soft tissues at the base of the incision measuring 4 x 2 cm. Musculoskeletal: No acute bony  abnormality. IMPRESSION: Open midline incision noted. At the base of the incision within the anterior abdominal wall is a gas and fluid collection measuring 4 x 2 cm. It is difficult to determine if this communicates with the skin surface. Small bilateral pleural effusions. Bilateral lower lobe atelectasis or infiltrate/pneumonia. Small calcification along the wall of the gallbladder fundus, favor focal calcification within the wall rather than small adherent stone. This is unchanged. Electronically Signed   By: Rolm Baptise M.D.   On: 07/30/2022 18:18   DG CHEST PORT 1 VIEW  Result Date: 07/30/2022 CLINICAL DATA:  Leukocytosis.  Cough. EXAM: PORTABLE CHEST 1 VIEW COMPARISON:  Chest radiograph 07/23/2022 FINDINGS: The cardiomediastinal silhouette is grossly unchanged, although the cardiac silhouette is partially obscured. Lung volumes remain low with similar appearance of confluent opacity in the left lung base. Milder opacities in the right lung base demonstrate mixed interval changes. No large pleural effusion or pneumothorax is identified. IMPRESSION: Persistent left greater than right basilar atelectasis or pneumonia. Electronically Signed   By: Logan Bores M.D.   On: 07/30/2022 11:56    Anti-infectives: Anti-infectives (From admission, onward)    Start     Dose/Rate Route Frequency Ordered Stop   07/26/22 1400  ceFEPIme (MAXIPIME) 2 g in sodium chloride 0.9 % 100 mL IVPB        2 g 200 mL/hr over 30 Minutes Intravenous Every 12 hours 07/26/22 1322 07/29/22 1919   07/25/22 0500  ciprofloxacin (CIPRO) IVPB 400 mg  Status:  Discontinued        400 mg 200 mL/hr over 60 Minutes Intravenous Every 24 hours 07/24/22 1350 07/24/22 1444   07/24/22 2100  ceFEPIme (MAXIPIME) 1 g in sodium chloride 0.9 % 100 mL IVPB  Status:  Discontinued        1 g 200 mL/hr over 30 Minutes Intravenous Every 24 hours 07/23/22 2029 07/24/22 0658   07/24/22 2100  metroNIDAZOLE (FLAGYL) IVPB 500 mg        500 mg 100 mL/hr  over 60 Minutes Intravenous Every 12 hours 07/24/22 0659 07/29/22 1823   07/24/22 2100  ceFEPIme (MAXIPIME) 1 g in sodium chloride 0.9 % 100 mL IVPB  Status:  Discontinued        1 g 200 mL/hr over 30 Minutes Intravenous Every 24 hours 07/24/22 0658 07/26/22 1322   07/24/22 2000  ciprofloxacin (CIPRO) IVPB 400 mg  Status:  Discontinued        400 mg 200 mL/hr over 60 Minutes Intravenous Every 12 hours 07/24/22 0659 07/24/22 1350   07/24/22 0315  ciprofloxacin (CIPRO) IVPB 400 mg        400 mg 200 mL/hr over 60 Minutes Intravenous STAT 07/24/22 0311 07/24/22 0441   07/24/22 0315  metroNIDAZOLE (FLAGYL) IVPB 500 mg        500 mg 100 mL/hr over 60 Minutes Intravenous STAT 07/24/22 0311 07/24/22 0457   07/23/22 2000  ceFEPIme (MAXIPIME) 2 g in sodium chloride 0.9 % 100 mL IVPB        2 g 200 mL/hr over 30 Minutes Intravenous  Once 07/23/22 1957 07/23/22 2239   07/23/22 2000  metroNIDAZOLE (FLAGYL) IVPB 500 mg  500 mg 100 mL/hr over 60 Minutes Intravenous  Once 07/23/22 1957 07/23/22 2240        Assessment/Plan POD 7,  Dx Laparoscopy, exploratory laparotomy, SBR, primary repair of incisional hernia - Dr. Rosendo Gros, 07/24/2022 - CT reviewed and only findings was the fluid collection deep to her wound. This was evacuated and wound repacked - no further N/V.  Adv to carb mod diet - Completed 5d post op abx - BID WTD midline - Mobilize, up with RN/Mobility tech/therapies - Rec for [facility] PT/OT - Pulm toilet  -WBC down slightly to 15.8.  no evidence of infection on CT scan and small fluid collection was simple with no concern for infection.   FEN - carb mod VTE - SCDs, Lovenox ID - Cefepime/Flagyl 8/2 - 8/8.   AKI on CKD3b - Cr improving ABL anemia - stable HTN Chronic dCHF DM2 HLD Hypothyroidism OSA on CPAP   LOS: 7 days    Henreitta Cea , [facility] 07/31/2022, 10:22 AM Please see Amion for pager number during day hours 7:00am-4:30pm

## 2022-07-31 NOTE — Discharge Instructions (Addendum)
[facility]      [facility] [phone]  OPEN ABDOMINAL SURGERY: POST OP INSTRUCTIONS  Always review your discharge instruction sheet given to you by the facility where your surgery was performed.  IF YOU HAVE DISABILITY OR FAMILY LEAVE FORMS, YOU MUST BRING THEM TO THE OFFICE FOR PROCESSING.  PLEASE DO NOT GIVE THEM TO YOUR DOCTOR.  A prescription for pain medication may be given to you upon discharge.  Take your pain medication as prescribed, if needed.  If narcotic pain medicine is not needed, then you may take acetaminophen (Tylenol) or ibuprofen (Advil) as needed. Take your usually prescribed medications unless otherwise directed. If you need a refill on your pain medication, please contact your pharmacy. They will contact our office to request authorization.  Prescriptions will not be filled after 5pm or on week-ends. You should follow a light diet the first few days after arrival home, such as soup and crackers, pudding, etc.unless your doctor has advised otherwise. A high-fiber, low fat diet can be resumed as tolerated.   Be sure to include lots of fluids daily. Most patients will experience some swelling and bruising on the chest and neck area.  Ice packs will help.  Swelling and bruising can take several days to resolve Most patients will experience some swelling and bruising in the area of the incision. Ice pack will help. Swelling and bruising can take several days to resolve..  It is common to experience some constipation if taking pain medication after surgery.  Increasing fluid intake and taking a stool softener will usually help or prevent this problem from occurring.  A mild laxative (Milk of Magnesia or Miralax) should be taken according to package directions if there are no bowel movements after 48 hours.  You may have steri-strips (small skin tapes) in place directly over the incision.  These strips should be left on the skin for 7-10 days.  If your surgeon used skin  glue on the incision, you may shower in 24 hours.  The glue will flake off over the next 2-3 weeks.  Any sutures or staples will be removed at the office during your follow-up visit. You may find that a light gauze bandage over your incision may keep your staples from being rubbed or pulled. You may shower and replace the bandage daily. ACTIVITIES:  You may resume regular (light) daily activities beginning the next day--such as daily self-care, walking, climbing stairs--gradually increasing activities as tolerated.  You may have sexual intercourse when it is comfortable.  Refrain from any heavy lifting or straining until approved by your doctor. You may drive when you no longer are taking prescription pain medication, you can comfortably wear a seatbelt, and you can safely maneuver your car and apply brakes Return to Work: ___________________________________ You should see your doctor in the office for a follow-up appointment approximately two weeks after your surgery.  Make sure that you call for this appointment within a day or two after you arrive home to insure a convenient appointment time. OTHER INSTRUCTIONS:  _____________________________________________________________ _____________________________________________________________  WHEN TO CALL YOUR DOCTOR: Fever over 101.0 Inability to urinate Nausea and/or vomiting Extreme swelling or bruising Continued bleeding from incision. Increased pain, redness, or drainage from the incision. Difficulty swallowing or breathing Muscle cramping or spasms. Numbness or tingling in hands or feet or around lips.  The clinic staff is available to answer your questions during regular business hours.  Please don't hesitate to call and ask to speak to one of   the nurses if you have concerns.  For further questions, please visit [email or web address]  WOUND CARE: - midline dressing to be changed daily - supplies: sterile saline, gauze, scissors,  tape  - remove dressing and all packing carefully, moistening with sterile saline as needed to avoid packing/internal dressing sticking to the wound. - clean edges of skin around the wound with water/gauze, making sure there is no tape debris or leakage left on skin that could cause skin irritation or breakdown. - dampen and clean gauze with sterile saline and pack wound from wound base to skin level, making sure to take note of any possible areas of wound tracking, tunneling and packing appropriately. Wound can be packed loosely. Trim gauze to size if a whole gauze is not required. - cover wound with a dry gauze and secure with tape.  - write the date/time on the dry dressing/tape to better track when the last dressing change occurred. - change dressing as needed if leakage occurs, wound gets contaminated, or patient requests to shower. - patient may shower daily with wound open (i.e. remove all packing) and following the shower the wound should be dried and a clean dressing placed.   ================================================================================  Rivaroxaban (Xarelto) ED Discharge Instructions   Patient received a prescription for  Xarelto 15 & 20 mg - 51 tablet VTE STARTER PACK.   Patient understands only the FIRST 30 DAYS OF TREATMENT  will be provided by the starter pack.   Patient understands to contact primary care doctor or ED immediately if for any reason is unable to fill the starter pack prescription.  Patient must schedule a follow-up appointment with primary care doctor within 15 days of discharge in order to receive the maintenance prescription and clinical follow up.  Patient has received an education kit containing ([facility] Trial Offer Card, DVT/PE brochure, Dosing Diary, and Xarelto Medication Guide).   If not performed in the ED, patient will receive medication counseling by a [facility] pharmacist via phone follow-up within the next 72 hours. Pharmacist  to review signs and symptoms of bleeding and proper use of this medication.   Call 911 or return immediately to the nearest ED if you develop bleeding (e.g. nose, gums, vomit, urine, bloody or dark stools), unusual bruising, head trauma (even if minor), severe headache, altered mental status, change in speech, weakness on one side of body, shortness of breath, swollen lips/tongue/face/neck, chest pain, or other concerns.    Information on my medicine - XARELTO (rivaroxaban)  This medication education was provided to me or my healthcare representative as part of my discharge instructions.   WHY WAS XARELTO PRESCRIBED FOR YOU?  Xarelto was prescribed to treat blood clots that may have been found in the veins of your legs (deep vein thrombosis) or in your lungs (pulmonary embolism) and to reduce the risk of them occurring again.   WHAT DO YOU NEED TO KNOW ABOUT XARELTO?  The starting dose is one 15 mg tablet taken TWICE daily with food for the FIRST 21 DAYS then on day 22 the dose is changed to one 20 mg tablet taken ONCE A DAY with your evening meal.   DO NOT stop taking Xarelto without talking to the health care provider who prescribed the medication. Refill your prescription for 20 mg tablets before you run out.  After discharge, you should have regular check-up appointments with your healthcare provider that is prescribing your Xarelto. In the future your dose may need to be changed if your kidney function   changes by a significant amount.   WHAT DO YOU DO IF YOU MISS A DOSE?  If you are taking Xarelto TWICE DAILY and you miss a dose, take it as soon as you remember. You may take two 15 mg tablets (total 30 mg) at the same time then resume your regularly scheduled 15 mg twice daily the next day.   If you are taking Xarelto ONCE DAILY and you miss a dose, take it as soon as you remember on the same day then continue your regularly scheduled once daily regimen the next day. Do not take two  doses of Xarelto at the same time.   IMPORTANT SAFETY INFORMATION  Xarelto is a blood thinner medicine that can cause bleeding. You should call your healthcare provider right away if you experience any of the following:  -  Bleeding from an injury or your nose that does not stop.  -  Unusual colored urine (red or dark brown) or unusual colored stools (red or black).  -  Unusual bruising for unknown reasons.  -  A serious fall or if you hit your head (even if there is no bleeding).   Some medicines may interact with Xarelto and might increase your risk of bleeding while on Xarelto. To help avoid this, consult your healthcare provider or pharmacist prior to using any new prescription or non-prescription medications, including herbals, vitamins, non-steroidal anti-inflammatory drugs (NSAIDs) and supplements.   This website has more information on Xarelto: www.[email or web address].com. 

## 2022-07-31 NOTE — Progress Notes (Signed)
Mobility Specialist Criteria Algorithm Info.   07/31/22 1440  Mobility  Activity Ambulated with assistance in hallway;Dangled on edge of bed  Range of Motion/Exercises Active;All extremities  Level of Assistance Standby assist, set-up cues, supervision of patient - no hands on  Assistive Device Front wheel walker  Distance Ambulated (ft) 200 ft  Activity Response Tolerated well   Patient received in supine agreeable to participate in mobility. Ambulated supervision level with slow steady gait. Had seated rest break x1 in lobby area to look out the windows, denied fatigue, SOB or pain. Returned to room without complaint or incident. Was left in supine with all needs met, call bell in reach.   07/31/2022 3:47 PM  Martinique Cabria Micalizzi, [place], [place]  PNPYY:[phone] Office: [phone]

## 2022-07-31 NOTE — Progress Notes (Signed)
Wound care/ dressing teaching provided to patient and family at bedside. Pt's husband showed understanding by demonstration. Dressing supplies was also provided.

## 2022-08-01 LAB — CBC WITH DIFFERENTIAL/PLATELET
Abs Immature Granulocytes: 0.73 10*3/uL — ABNORMAL HIGH (ref 0.00–0.07)
Basophils Absolute: 0.1 10*3/uL (ref 0.0–0.1)
Basophils Relative: 0 %
Eosinophils Absolute: 0.4 10*3/uL (ref 0.0–0.5)
Eosinophils Relative: 2 %
HCT: 28.4 % — ABNORMAL LOW (ref 36.0–46.0)
Hemoglobin: 9 g/dL — ABNORMAL LOW (ref 12.0–15.0)
Immature Granulocytes: 5 %
Lymphocytes Relative: 15 %
Lymphs Abs: 2.4 10*3/uL (ref 0.7–4.0)
MCH: 28.8 pg (ref 26.0–34.0)
MCHC: 31.7 g/dL (ref 30.0–36.0)
MCV: 91 fL (ref 80.0–100.0)
Monocytes Absolute: 1 10*3/uL (ref 0.1–1.0)
Monocytes Relative: 6 %
Neutro Abs: 11.6 10*3/uL — ABNORMAL HIGH (ref 1.7–7.7)
Neutrophils Relative %: 72 %
Platelets: 356 10*3/uL (ref 150–400)
RBC: 3.12 MIL/uL — ABNORMAL LOW (ref 3.87–5.11)
RDW: 15.5 % (ref 11.5–15.5)
WBC: 16.2 10*3/uL — ABNORMAL HIGH (ref 4.0–10.5)
nRBC: 0.1 % (ref 0.0–0.2)

## 2022-08-01 LAB — BASIC METABOLIC PANEL
Anion gap: 5 (ref 5–15)
BUN: 6 mg/dL — ABNORMAL LOW (ref 8–23)
CO2: 21 mmol/L — ABNORMAL LOW (ref 22–32)
Calcium: 7.6 mg/dL — ABNORMAL LOW (ref 8.9–10.3)
Chloride: 110 mmol/L (ref 98–111)
Creatinine, Ser: 1.01 mg/dL — ABNORMAL HIGH (ref 0.44–1.00)
GFR, Estimated: 58 mL/min — ABNORMAL LOW (ref >60)
Glucose, Bld: 226 mg/dL — ABNORMAL HIGH (ref 70–99)
Potassium: 3.3 mmol/L — ABNORMAL LOW (ref 3.5–5.1)
Sodium: 136 mmol/L (ref 135–145)

## 2022-08-01 LAB — MAGNESIUM: Magnesium: 1.7 mg/dL (ref 1.7–2.4)

## 2022-08-01 LAB — GLUCOSE, CAPILLARY: Glucose-Capillary: 119 mg/dL — ABNORMAL HIGH (ref 70–99)

## 2022-08-01 MED ORDER — TRAMADOL HCL 50 MG PO TABS: 50.0000 mg | ORAL_TABLET | Freq: Four times a day (QID) | ORAL | 0 refills | Status: DC | PRN

## 2022-08-01 MED ORDER — TRAMADOL HCL 50 MG PO TABS: 50.0000 mg | ORAL_TABLET | Freq: Four times a day (QID) | ORAL | Status: DC | PRN

## 2022-08-01 MED ORDER — FUROSEMIDE 80 MG PO TABS: ORAL_TABLET | ORAL | 3 refills | Status: DC

## 2022-08-01 MED ORDER — POTASSIUM CHLORIDE CRYS ER 20 MEQ PO TBCR
40.0000 meq | EXTENDED_RELEASE_TABLET | ORAL | Status: AC
  Administered 2022-08-01 (×2): 40 meq via ORAL
  Filled 2022-08-01 (×2): qty 2

## 2022-08-01 MED ORDER — ONDANSETRON 4 MG PO TBDP
4.0000 mg | ORAL_TABLET | Freq: Four times a day (QID) | ORAL | 0 refills | Status: DC | PRN
Start: 2022-08-01 — End: 2023-03-05

## 2022-08-01 NOTE — Progress Notes (Signed)
8 Days Post-Op  Subjective: No new complaints today.  Feeling well.  Having some stool each time she voids still.  Tolerating solid diet with no issues.  Husband did her dressing change this morning and did well.  Objective: Vital signs in last 24 hours: Temp:  [98 F (36.7 C)-99.4 F (37.4 C)] 98.7 F (37.1 C) (08/11 0733) Pulse Rate:  [72-73] 72 (08/11 0733) Resp:  [16-18] 16 (08/11 0733) BP: (111-127)/(64-68) 127/67 (08/11 0733) SpO2:  [96 %-97 %] 97 % (08/11 0733) Last BM Date : 07/29/22  Intake/Output from previous day: No intake/output data recorded. Intake/Output this shift: No intake/output data recorded.  PE: Gen:  Alert, NAD, pleasant Abd: Soft, mild upper abdominal distension, appropriately tender. +BS. Midline wound clean.   Lab Results:  Recent Labs    07/31/22 0209 08/01/22 0131  WBC 15.8* 16.2*  HGB 8.8* 9.0*  HCT 27.5* 28.4*  PLT 352 356   BMET Recent Labs    07/31/22 0209 08/01/22 0131  NA 136 136  K 3.6 3.3*  CL 108 110  CO2 23 21*  GLUCOSE 154* 226*  BUN 6* 6*  CREATININE 1.05* 1.01*  CALCIUM 7.9* 7.6*   PT/INR No results for input(s): "LABPROT", "INR" in the last 72 hours. CMP     Component Value Date/Time   NA 136 08/01/2022 0131   K 3.3 (L) 08/01/2022 0131   CL 110 08/01/2022 0131   CO2 21 (L) 08/01/2022 0131   GLUCOSE 226 (H) 08/01/2022 0131   BUN 6 (L) 08/01/2022 0131   CREATININE 1.01 (H) 08/01/2022 0131   CREATININE 2.67 (H) 07/23/2022 1056   CALCIUM 7.6 (L) 08/01/2022 0131   PROT 8.2 (H) 07/23/2022 1917   ALBUMIN 4.5 07/23/2022 1917   AST 28 07/23/2022 1917   ALT 19 07/23/2022 1917   ALKPHOS 95 07/23/2022 1917   BILITOT 1.2 07/23/2022 1917   GFRNONAA 58 (L) 08/01/2022 0131   GFRNONAA 38 (L) 03/04/2021 1110   GFRAA 44 (L) 03/04/2021 1110   Lipase  No results found for: "LIPASE"  Studies/Results: CT ABDOMEN PELVIS W CONTRAST  Result Date: 07/30/2022 CLINICAL DATA:  Abdominal pain, post-op exploratory  laparotomy and repair of umbilical hernia EXAM: CT ABDOMEN AND PELVIS WITH CONTRAST TECHNIQUE: Multidetector CT imaging of the abdomen and pelvis was performed using the standard protocol following bolus administration of intravenous contrast. RADIATION DOSE REDUCTION: This exam was performed according to the departmental dose-optimization program which includes automated exposure control, adjustment of the mA and/or kV according to patient size and/or use of iterative reconstruction technique. CONTRAST:  49mL OMNIPAQUE IOHEXOL 300 MG/ML  SOLN COMPARISON:  07/23/2022 FINDINGS: Lower chest: Small bilateral pleural effusions. Airspace opacities in both lower lobes could reflect atelectasis or pneumonia. Coronary artery calcifications in the visualized left anterior descending coronary artery. Hepatobiliary: Small calcification noted along the wall of the gallbladder in the fundus is stable since prior study this could reflect wall calcification (favored) or small adherent stone. No focal hepatic abnormality. Pancreas: No focal abnormality or ductal dilatation. Spleen: No focal abnormality.  Normal size. Adrenals/Urinary Tract: Subcentimeter cyst in the midpole of the left kidney is unchanged and appears simple. No follow-up imaging recommended. Adrenal glands unremarkable. No stones or hydronephrosis. Urinary bladder unremarkable. Stomach/Bowel: Stomach, large and small bowel grossly unremarkable. Vascular/Lymphatic: Aortic atherosclerosis. No evidence of aneurysm or adenopathy. Reproductive: Uterus and adnexa unremarkable.  No mass. Other: No free fluid or free air. Open midline incision noted contains small gas and fluid  collection in the subcutaneous soft tissues at the base of the incision measuring 4 x 2 cm. Musculoskeletal: No acute bony abnormality. IMPRESSION: Open midline incision noted. At the base of the incision within the anterior abdominal wall is a gas and fluid collection measuring 4 x 2 cm. It is  difficult to determine if this communicates with the skin surface. Small bilateral pleural effusions. Bilateral lower lobe atelectasis or infiltrate/pneumonia. Small calcification along the wall of the gallbladder fundus, favor focal calcification within the wall rather than small adherent stone. This is unchanged. Electronically Signed   By: Rolm Baptise M.D.   On: 07/30/2022 18:18   DG CHEST PORT 1 VIEW  Result Date: 07/30/2022 CLINICAL DATA:  Leukocytosis.  Cough. EXAM: PORTABLE CHEST 1 VIEW COMPARISON:  Chest radiograph 07/23/2022 FINDINGS: The cardiomediastinal silhouette is grossly unchanged, although the cardiac silhouette is partially obscured. Lung volumes remain low with similar appearance of confluent opacity in the left lung base. Milder opacities in the right lung base demonstrate mixed interval changes. No large pleural effusion or pneumothorax is identified. IMPRESSION: Persistent left greater than right basilar atelectasis or pneumonia. Electronically Signed   By: Logan Bores M.D.   On: 07/30/2022 11:56    Anti-infectives: Anti-infectives (From admission, onward)    Start     Dose/Rate Route Frequency Ordered Stop   07/26/22 1400  ceFEPIme (MAXIPIME) 2 g in sodium chloride 0.9 % 100 mL IVPB        2 g 200 mL/hr over 30 Minutes Intravenous Every 12 hours 07/26/22 1322 07/29/22 1919   07/25/22 0500  ciprofloxacin (CIPRO) IVPB 400 mg  Status:  Discontinued        400 mg 200 mL/hr over 60 Minutes Intravenous Every 24 hours 07/24/22 1350 07/24/22 1444   07/24/22 2100  ceFEPIme (MAXIPIME) 1 g in sodium chloride 0.9 % 100 mL IVPB  Status:  Discontinued        1 g 200 mL/hr over 30 Minutes Intravenous Every 24 hours 07/23/22 2029 07/24/22 0658   07/24/22 2100  metroNIDAZOLE (FLAGYL) IVPB 500 mg        500 mg 100 mL/hr over 60 Minutes Intravenous Every 12 hours 07/24/22 0659 07/29/22 1823   07/24/22 2100  ceFEPIme (MAXIPIME) 1 g in sodium chloride 0.9 % 100 mL IVPB  Status:  Discontinued         1 g 200 mL/hr over 30 Minutes Intravenous Every 24 hours 07/24/22 0658 07/26/22 1322   07/24/22 2000  ciprofloxacin (CIPRO) IVPB 400 mg  Status:  Discontinued        400 mg 200 mL/hr over 60 Minutes Intravenous Every 12 hours 07/24/22 0659 07/24/22 1350   07/24/22 0315  ciprofloxacin (CIPRO) IVPB 400 mg        400 mg 200 mL/hr over 60 Minutes Intravenous STAT 07/24/22 0311 07/24/22 0441   07/24/22 0315  metroNIDAZOLE (FLAGYL) IVPB 500 mg        500 mg 100 mL/hr over 60 Minutes Intravenous STAT 07/24/22 0311 07/24/22 0457   07/23/22 2000  ceFEPIme (MAXIPIME) 2 g in sodium chloride 0.9 % 100 mL IVPB        2 g 200 mL/hr over 30 Minutes Intravenous  Once 07/23/22 1957 07/23/22 2239   07/23/22 2000  metroNIDAZOLE (FLAGYL) IVPB 500 mg        500 mg 100 mL/hr over 60 Minutes Intravenous  Once 07/23/22 1957 07/23/22 2240        Assessment/Plan POD 8,  Dx  Laparoscopy, exploratory laparotomy, SBR, primary repair of incisional hernia - Dr. Rosendo Gros, 07/24/2022 - CT reviewed and only findings was the fluid collection deep to her wound. This was evacuated and wound repacked yesterday.   -tolerating carb mod diet - Completed 5d post op abx - BID WTD midline - Mobilize, up with RN/Mobility tech/therapies - Rec for [facility] PT/OT - Pulm toilet  -WBC stable around 16K.  no evidence of infection on CT scan and small fluid collection was simple with no concern for infection. -follow up arranged -patient is surgically stable for DC home from our standpoint.  Message sent to primary service to relay this info.   FEN - carb mod VTE - SCDs, Lovenox ID - Cefepime/Flagyl 8/2 - 8/8.   AKI on CKD3b - Cr improving ABL anemia - stable HTN Chronic dCHF DM2 HLD Hypothyroidism OSA on CPAP   LOS: 8 days    Jocelyn Camacho , [facility] Surgery 08/01/2022, 9:18 AM Please see Amion for pager number during day hours 7:00am-4:30pm

## 2022-08-01 NOTE — TOC Progression Note (Signed)
Transition of Care [facility]) - Progression Note    Patient Details  Name: SHAIANN MCMANAMON MRN: [id number] Date of Birth: 10-29-1946  Transition of Care [facility]) CM/SW Contact  Elecia Serafin, Edson Snowball, RN Phone Number: 08/01/2022, 9:56 AM  Clinical Narrative:    [place] for walker and 3 in 1 .   Notified [facility] of discharge    Expected Discharge Plan: [place] Barriers to Discharge: Continued Medical Work up  Expected Discharge Plan and Services Expected Discharge Plan: [place] In-house Referral: NA Discharge Planning Services: CM Consult Post Acute Care Choice: [place] arrangements for the past 2 months: Single Family Home Expected Discharge Date: 08/01/22               DME Arranged: N/A DME Agency: NA       HH Arranged: RN, PT, OT HH Agency: [facility] Date HH Agency Contacted: 07/28/22 Time [place]: [id number] Representative spoke with at [place]: Hoyle Sauer   Social Determinants of Health ([place]) Interventions    Readmission Risk Interventions     No data to display

## 2022-08-01 NOTE — Progress Notes (Signed)
Discharge instructions given to patient and husband. Verbalize understanding.

## 2022-08-01 NOTE — Progress Notes (Signed)
   08/01/22 1000  Clinical Encounter Type  Visited With Patient and family together  Visit Type Initial;Spiritual support  Referral From Chaplain  Consult/Referral To Chaplain   Chaplain visited with patient, Jocelyn Camacho and her husband. Both are well and in good spirits today.   Danice Goltz [facility]  [phone]

## 2022-08-01 NOTE — Progress Notes (Signed)
Physical Therapy Treatment Patient Details Name: Jocelyn Camacho MRN: [id number] DOB: July 01, 1946 Today's Date: 08/01/2022   History of Present Illness 76 y/o female who presents to [place] on 07/23/22 due to abdominal pain, N/V. Found to have SBO and incarcerated hernia, transferred to [facility] and now s/p exp laparatomy, small bowel resection and hernia repair 07/24/22. PMH includes HTN, anxiety, DM, OSA, obesity.    PT Comments    Pt seen for last PT session prior to d/c. Pt reporting abdominal discomfort today, but reports she is eager to d/c home. Pt ambulatory with use of RW and min cuing for safety, requires seated rest breaks as needed to recover fatigue. PT explained importance of frequent mobility once d/c home to return to PLOF and encourage motility, pt expresses understanding. Pt also encouraged pt to walk 6x/day when home for short distances, progressing as able and as fatigue allows. Pt will have husband to assist her once home. Plan for d/c home today.   Of note, pt with self-inflicted scratches on buttocks and pt drew small amount of blood. Mepilex applied, RN notified.    Recommendations for follow up therapy are one component of a multi-disciplinary discharge planning process, led by the attending physician.  Recommendations may be updated based on patient status, additional functional criteria and insurance authorization.  Follow Up Recommendations  No PT follow up     Assistance Recommended at Discharge Intermittent Supervision/Assistance  Patient can return home with the following A little help with walking and/or transfers;A little help with bathing/dressing/bathroom;Help with stairs or ramp for entrance;Assist for transportation;Assistance with cooking/housework   Equipment Recommendations  BSC/3in1;Rolling walker (2 wheels) (youth RW)    Recommendations for Other Services       Precautions / Restrictions Restrictions Weight Bearing Restrictions: No     Mobility  Bed  Mobility Overal bed mobility: Needs Assistance Bed Mobility: Supine to Sit, Sit to Supine Rolling: Min assist Sidelying to sit: Min assist     Sit to sidelying: Min assist General bed mobility comments: assist for log roll to/from EOB, LE lift into bed .    Transfers Overall transfer level: Needs assistance Equipment used: Rolling walker (2 wheels) Transfers: Sit to/from Stand Sit to Stand: Supervision           General transfer comment: for safety, STS x3 from EOB, toilet, and chair in hallway    Ambulation/Gait Ambulation/Gait assistance: Supervision Gait Distance (Feet): 100 Feet (+75) Assistive device: Rolling walker (2 wheels) Gait Pattern/deviations: Step-through pattern, Decreased stride length, Trunk flexed Gait velocity: decr     General Gait Details: cues for upright posture, placement in RW   Stairs             Wheelchair Mobility    Modified Rankin (Stroke Patients Only)       Balance Overall balance assessment: Needs assistance Sitting-balance support: Feet supported Sitting balance-Leahy Scale: Good     Standing balance support: Bilateral upper extremity supported, During functional activity Standing balance-Leahy Scale: Poor Standing balance comment: relies on RW for support.                            Cognition Arousal/Alertness: Awake/alert Behavior During Therapy: WFL for tasks assessed/performed Overall Cognitive Status: Within Functional Limits for tasks assessed  Exercises General Exercises - Lower Extremity Long Arc Quad: AROM, Both, 5 reps, Seated Hip Flexion/Marching: AROM, Both, 5 reps, Seated    General Comments        Pertinent Vitals/Pain Pain Assessment Pain Assessment: Faces Faces Pain Scale: Hurts little more Pain Location: abdomen Pain Descriptors / Indicators: Discomfort Pain Intervention(s): Monitored during session, Repositioned,  Limited activity within patient's tolerance    Home Living                          Prior Function            PT Goals (current goals can now be found in the care plan section) Acute Rehab PT Goals Patient Stated Goal: return to independence PT Goal Formulation: With patient Time For Goal Achievement: 08/08/22 Potential to Achieve Goals: Good Progress towards PT goals: Progressing toward goals    Frequency    Min 3X/week      PT Plan Current plan remains appropriate    Co-evaluation              AM-PAC PT "6 Clicks" Mobility   Outcome Measure  Help needed turning from your back to your side while in a flat bed without using bedrails?: A Little Help needed moving from lying on your back to sitting on the side of a flat bed without using bedrails?: A Little Help needed moving to and from a bed to a chair (including a wheelchair)?: A Little Help needed standing up from a chair using your arms (e.g., wheelchair or bedside chair)?: A Little Help needed to walk in hospital room?: A Little Help needed climbing 3-5 steps with a railing? : A Little 6 Click Score: 18    End of Session   Activity Tolerance: Patient tolerated treatment well;Patient limited by pain Patient left: with call bell/phone within reach;with family/visitor present;in bed Nurse Communication: Mobility status PT Visit Diagnosis: Pain;Muscle weakness (generalized) (M62.81);Difficulty in walking, not elsewhere classified (R26.2)     Time: 4696-2952 PT Time Calculation (min) (ACUTE ONLY): 25 min  Charges:  $Gait Training: 8-22 mins $Self Care/Home Management: 8-22                     Stacie Glaze, PT DPT Acute Rehabilitation Services Pager [phone]  Office [phone]    [place] 08/01/2022, 1:06 PM

## 2022-08-01 NOTE — Progress Notes (Signed)
Wound care education provided to patient's family. Spouse explain and  demonstrated by changing dressing.

## 2022-08-01 NOTE — Plan of Care (Signed)
  Problem: Activity: Goal: Risk for activity intolerance will decrease Outcome: Progressing   Problem: Pain Managment: Goal: General experience of comfort will improve Outcome: Progressing   Problem: Safety: Goal: Ability to remain free from injury will improve Outcome: Progressing   

## 2022-08-05 NOTE — Discharge Summary (Signed)
Physician Discharge Summary   Patient: Jocelyn Camacho MRN: [id number] DOB: 1946/09/18  Admit date:     07/23/2022  Discharge date: 08/01/2022  Discharge Physician: Berle Mull  PCP: [name], MD  Recommendations at discharge: Follow-up with surgery as recommended. Continue wound care on discharge.   Follow-up Information     Home, [facility] Follow up.   Why: someone from the office will call to schedule home health visits Contact information: [place] [address] [phone]         Ralene Ok, MD Follow up on 08/20/2022.   Specialty: General Surgery Why: 11:40 am, Arrive 30 minutes prior to your appointment time, Please bring your insurance card and photo ID Contact information: [address] [place] [address] [phone]         [name], MD. Schedule an appointment as soon as possible for a visit in 1 week(s).   Specialty: Internal Medicine Contact information: [address] [place]  [address] [phone]                Discharge Diagnoses: Principal Problem:   Strangulated umbilical hernia Active Problems:   Essential hypertension   Type 2 diabetes mellitus with stage 3 chronic kidney disease, with long-term current use of insulin (HCC)   Hypothyroidism   Hyperlipidemia associated with type 2 diabetes mellitus (HCC)   Obesity, Class III, BMI 40-49.9 (morbid obesity) ([place])   AKI (acute kidney injury) ([place])   Glaucoma  Hospital Course: PMH of type II DM, HTN, HLD, chronic HFpEF, CKD 3B, OSA, obesity presented to the hospital with complaints of abdominal pain nausea and vomiting and found to have incarcerated hernia with ischemic bowel.  SP small bowel resection and hernia repair on 07/24/2022. Now monitoring for postop recovery.  Assessment and Plan  Incarcerated hernia with ischemic bowel  infraumbilical hernia containing a small bowel loop with incarceration of proximal  obstruction. S/P exploratory laparotomy with small bowel resection and hernia repair by Dr. Rosendo Gros 07/24/2022. Pathology with ischemic necrosis transmural, mesenteric venous thrombi no edematous change or malignancy identified Management per general surgery. Completed Cefepime and metronidazole CT abdomen shows expected postop changes. Per surgery patient will be discharged home as she is able to tolerate diet. Outpatient follow-up with surgery. Continue wound care.   Acute renal failure on CKD stage IIIb: Resolved Patient presenting with a creatinine of 2.67 with peak of 3.76 during hospitalization.   Baseline creatinine 1.4-1.6.   Etiology likely secondary to prerenal azotemia in the setting of nausea and vomiting from bowel obstruction as above.   Acute postoperative blood loss anemia Hemoglobin 13.0 on admission, postoperatively down to 9.4; stable.   Essential hypertension Chronic diastolic congestive heart failure, compensated Home regimen includes atenolol 100 mg p.o. daily furosemide 40 mg p.o. daily, losartan 100 mg p.o. daily, Finerenone 10 mg p.o. daily. Continue home regimen for now.  Other than losartan.   Type 2 diabetes mellitus controlled, with long-term insulin use without any complication Hemoglobin Y1P 5.2, well controlled.  Home regimen includes NPH 20 units every morning, 5 units every afternoon, Trulicity 3 mg weekly.   Hypothyroidism On levothyroxine 125 mcg p.o. daily at home.  TSH within normal limits. Restart levothyroxine   Hyperlipidemia:  Continue statin.   Gout:  Continue allopurinol.   Glaucoma:  Continue eyedrops   OSA Continue nocturnal CPAP   Weakness/debility/deconditioning: PT/OT recommending home health on discharge, continue therapy while inpatient   Morbid obesity Body mass index is  41.88 kg/m.   Discussed with patient needs for aggressive lifestyle changes/weight loss as this complicates all facets of care.   Outpatient follow-up  with PCP.    Pain control - [employer] Controlled Substance Reporting System database was reviewed. and patient was instructed, not to drive, operate heavy machinery, perform activities at heights, swimming or participation in water activities or provide baby-sitting services while on Pain, Sleep and Anxiety Medications; until their outpatient Physician has advised to do so again. Also recommended to not to take more than prescribed Pain, Sleep and Anxiety Medications.   Consultants:  General surgery   Procedures performed:  LAPAROSCOPY DIAGNOSTIC  EXPLORATORY LAPAROTOMY SMALL BOWEL RESECTION PRIMARY INCISIONAL HERNIA REPAIR  DISCHARGE MEDICATION: Allergies as of 08/01/2022       Reactions   Penicillins Swelling   Sulfa Antibiotics Itching, Rash        Medication List     STOP taking these medications    BLACK PEPPER-TURMERIC PO   losartan 100 MG tablet Commonly known as: Cozaar       TAKE these medications    acetaminophen 650 MG CR tablet Commonly known as: TYLENOL Take 1,300 mg by mouth every 8 (eight) hours as needed for pain.   allopurinol 300 MG tablet Commonly known as: ZYLOPRIM TAKE 1/2 TABLET(150 MG) BY MOUTH DAILY FOR GOUT PREVENTION What changed:  how much to take how to take this when to take this additional instructions   aspirin EC 81 MG tablet Take 81 mg by mouth daily.   atenolol 100 MG tablet Commonly known as: TENORMIN Take 1 tablet by mouth once daily for blood pressure What changed: additional instructions   B-12 1000 MCG Subl Place 1,000 mcg under the tongue 2 (two) times a week. Saturday and Sunday   colchicine 0.6 MG tablet Take  1 tablet  Daily  to Prevent Gout                                                                 /                         TAKE                     BY                     MOUTH What changed:  how much to take how to take this when to take this additional instructions   dorzolamide-timolol  22.3-6.8 MG/ML ophthalmic solution Commonly known as: COSOPT Place 1 drop into the left eye 2 (two) times daily.   furosemide 80 MG tablet Commonly known as: LASIX TAKE 1 TABLET BY MOUTH 1 TO 2 TIMES PER DAY AS NEEDED FOR FLUID RETENTION OR ANKLE SWELLING What changed:  how much to take how to take this when to take this additional instructions   hyoscyamine 0.125 MG tablet Commonly known as: LEVSIN Take 1 tablet (0.125 mg total) by mouth every 6 (six) hours as needed. What changed: reasons to take this   insulin NPH-regular Human (70-30) 100 UNIT/ML injection Patient injects 25 units in the AM and 10 units in the PM. Reduce each insulin dose by 2 units if glucose  persistently <100 What changed:  how much to take how to take this when to take this additional instructions   Kerendia 10 MG Tabs Generic drug: Finerenone Take 10 mg by mouth daily. Take  1 tablet  Daily  for Diabetic Kidneys What changed: additional instructions   levothyroxine 125 MCG tablet Commonly known as: SYNTHROID Take  1 tablet  Daily  on an empty stomach with only water for 30 minutes & no Antacid meds, Calcium or Magnesium for 4 hours & avoid Biotin What changed:  how much to take how to take this when to take this additional instructions   ondansetron 4 MG disintegrating tablet Commonly known as: ZOFRAN-ODT Take 1 tablet (4 mg total) by mouth every 6 (six) hours as needed for nausea.   OneTouch Ultra test strip Generic drug: glucose blood CHECK BLOOD SUGAR 3 TIMES A DAY   onetouch ultrasoft lancets Check blood sugar 3 times daily-DX-E10.9   polymyxin B 500,000 Units in sodium chloride irrigation 0.9 % 500 mL Irrigate with 1 Application as directed every 30 (thirty) days. Eye Injection   rosuvastatin 5 MG tablet Commonly known as: CRESTOR Take 1 tablet once weekly as directed for Cholesterol LDL goal less than 70.   traMADol 50 MG tablet Commonly known as: ULTRAM Take 1 tablet (50 mg  total) by mouth every 6 (six) hours as needed for moderate pain.   Trulicity 3 WH/6.7RF Sopn Generic drug: Dulaglutide Inject  3 MG(0.5 ML) into the skin  weekly for Diabetes (Dx: e10.29) What changed:  how much to take how to take this when to take this additional instructions   vitamin C 1000 MG tablet Take 1,000 mg by mouth daily.   Vitamin D3 125 MCG (5000 UT) Caps Take 5,000 Units by mouth daily.   zinc gluconate 50 MG tablet Take 50 mg by mouth daily.               Discharge Care Instructions  (From admission, onward)           Start     Ordered   08/01/22 0000  Discharge wound care:       Comments: Left Leg wound  Cleanse wound on left lower leg with saline and pat dry. Then, apply a nickel thick layer of MediHoney directly to the wound or onto a dressing. Make certain that the dressing covers the entire wound base, but not in contact with the peri-wound skin (scar) and secure with gauze and silicone foam. Change dressing daily.  Abdomen Wound care   Follow separate instruction given by General surgery   08/01/22 0956           Disposition: Home Diet recommendation: low fiber soft diet  Discharge Exam: Vitals:   07/31/22 2011 08/01/22 0521 08/01/22 0733 08/01/22 1115  BP: 125/64 (P) 132/70 127/67 120/73  Pulse:   72 78  Resp:   16   Temp: 98.4 F (36.9 C) (P) 99.4 F (37.4 C) 98.7 F (37.1 C) 99.1 F (37.3 C)  TempSrc: Oral (P) Oral  Oral  SpO2:   97% 96%  Weight:      Height:       General: Appear in mild distress; no visible Abnormal Neck Mass Or lumps, Conjunctiva normal Cardiovascular: S1 and S2 Present, no Murmur, Respiratory: good respiratory effort, Bilateral Air entry present and CTA, no Crackles, no wheezes Abdomen: Bowel Sound present, tender around the surgical site Extremities: trace Pedal edema Neurology: alert and oriented to time, place,  and person  [facility] Weights   07/23/22 2122  Weight: 103.9 kg   Condition at  discharge: stable  The results of significant diagnostics from this hospitalization (including imaging, microbiology, ancillary and laboratory) are listed below for reference.   Imaging Studies: CT ABDOMEN PELVIS W CONTRAST  Result Date: 07/30/2022 CLINICAL DATA:  Abdominal pain, post-op exploratory laparotomy and repair of umbilical hernia EXAM: CT ABDOMEN AND PELVIS WITH CONTRAST TECHNIQUE: Multidetector CT imaging of the abdomen and pelvis was performed using the standard protocol following bolus administration of intravenous contrast. RADIATION DOSE REDUCTION: This exam was performed according to the departmental dose-optimization program which includes automated exposure control, adjustment of the mA and/or kV according to patient size and/or use of iterative reconstruction technique. CONTRAST:  2mL OMNIPAQUE IOHEXOL 300 MG/ML  SOLN COMPARISON:  07/23/2022 FINDINGS: Lower chest: Small bilateral pleural effusions. Airspace opacities in both lower lobes could reflect atelectasis or pneumonia. Coronary artery calcifications in the visualized left anterior descending coronary artery. Hepatobiliary: Small calcification noted along the wall of the gallbladder in the fundus is stable since prior study this could reflect wall calcification (favored) or small adherent stone. No focal hepatic abnormality. Pancreas: No focal abnormality or ductal dilatation. Spleen: No focal abnormality.  Normal size. Adrenals/Urinary Tract: Subcentimeter cyst in the midpole of the left kidney is unchanged and appears simple. No follow-up imaging recommended. Adrenal glands unremarkable. No stones or hydronephrosis. Urinary bladder unremarkable. Stomach/Bowel: Stomach, large and small bowel grossly unremarkable. Vascular/Lymphatic: Aortic atherosclerosis. No evidence of aneurysm or adenopathy. Reproductive: Uterus and adnexa unremarkable.  No mass. Other: No free fluid or free air. Open midline incision noted contains small gas and  fluid collection in the subcutaneous soft tissues at the base of the incision measuring 4 x 2 cm. Musculoskeletal: No acute bony abnormality. IMPRESSION: Open midline incision noted. At the base of the incision within the anterior abdominal wall is a gas and fluid collection measuring 4 x 2 cm. It is difficult to determine if this communicates with the skin surface. Small bilateral pleural effusions. Bilateral lower lobe atelectasis or infiltrate/pneumonia. Small calcification along the wall of the gallbladder fundus, favor focal calcification within the wall rather than small adherent stone. This is unchanged. Electronically Signed   By: Rolm Baptise M.D.   On: 07/30/2022 18:18   DG CHEST PORT 1 VIEW  Result Date: 07/30/2022 CLINICAL DATA:  Leukocytosis.  Cough. EXAM: PORTABLE CHEST 1 VIEW COMPARISON:  Chest radiograph 07/23/2022 FINDINGS: The cardiomediastinal silhouette is grossly unchanged, although the cardiac silhouette is partially obscured. Lung volumes remain low with similar appearance of confluent opacity in the left lung base. Milder opacities in the right lung base demonstrate mixed interval changes. No large pleural effusion or pneumothorax is identified. IMPRESSION: Persistent left greater than right basilar atelectasis or pneumonia. Electronically Signed   By: Logan Bores M.D.   On: 07/30/2022 11:56   DG Abd 1 View  Result Date: 07/26/2022 CLINICAL DATA:  Evaluate NG tube EXAM: ABDOMEN - 1 VIEW COMPARISON:  None Available. FINDINGS: The NG tube is been pulled back in the interval. However, the side port and distal tip remain below GE junction, within the stomach. A right femoral line is in good position. No other acute abnormalities. IMPRESSION: The NG tube appears to be in good position.  No other abnormalities. Electronically Signed   By: Dorise Bullion III M.D.   On: 07/26/2022 09:11   DG Abdomen 1 View  Result Date: 07/24/2022 CLINICAL DATA:  NG tube  placement. EXAM: ABDOMEN - 1 VIEW  COMPARISON:  CT earlier today. FINDINGS: The enteric tube tip and side-port below the diaphragm. The tube is looped in the distal stomach, tip directed towards the gastric cardia. Gaseous small bowel distention in the central abdomen is partially included. IMPRESSION: Enteric tube with tip and side-port below the diaphragm in the distal stomach. The tube is looped in the distal stomach, tip directed towards the gastric cardia. Electronically Signed   By: Keith Rake M.D.   On: 07/24/2022 01:09   CT ABDOMEN PELVIS WO CONTRAST  Result Date: 07/23/2022 CLINICAL DATA:  Acute abdominal pain EXAM: CT ABDOMEN AND PELVIS WITHOUT CONTRAST TECHNIQUE: Multidetector CT imaging of the abdomen and pelvis was performed following the standard protocol without IV contrast. RADIATION DOSE REDUCTION: This exam was performed according to the departmental dose-optimization program which includes automated exposure control, adjustment of the mA and/or kV according to patient size and/or use of iterative reconstruction technique. COMPARISON:  Chest x-ray from earlier in the same day. FINDINGS: Lower chest: Patchy airspace opacity is noted in the bases right considerably greater than left with a more infiltrative pattern seen than that noted on prior chest x-ray. Hepatobiliary: Cholelithiasis is noted. The liver is within normal limits. Pancreas: Unremarkable. No pancreatic ductal dilatation or surrounding inflammatory changes. Spleen: Normal in size without focal abnormality. Adrenals/Urinary Tract: Adrenal glands are within normal limits. Kidneys are well visualized bilaterally. Tiny punctate stones are noted in the left lower pole. No obstructive changes are seen. The bladder is decompressed. Stomach/Bowel: No obstructive or inflammatory changes of the colon are seen. The appendix is within normal limits. Distal small bowel is decompressed. The stomach is distended with fluid and the proximal small bowel is distended as well.  These changes are secondary to an infraumbilical abdominal wall hernia containing both fat and a knuckle of small bowel. This causes significant luminal narrowing at the hernia site and proximal partial small bowel obstruction. Vascular/Lymphatic: Aortic atherosclerosis. No enlarged abdominal or pelvic lymph nodes. Right femoral vein central line is noted. Reproductive: Uterus and bilateral adnexa are unremarkable. Other: No ascites is noted. Musculoskeletal: No acute or significant osseous findings. IMPRESSION: Infraumbilical hernia containing a loop of small bowel with incarceration and proximal obstruction. Surgical consultation is recommended. Bibasilar airspace opacity left greater than right. Tiny nonobstructing left renal calculi. Electronically Signed   By: Inez Catalina M.D.   On: 07/23/2022 22:43   DG Chest Port 1 View  Result Date: 07/23/2022 CLINICAL DATA:  Nausea and vomiting, initial encounter EXAM: PORTABLE CHEST 1 VIEW COMPARISON:  10/09/2008 FINDINGS: Cardiac shadow is stable. Bibasilar atelectatic changes are noted left greater than right. No sizable effusion is noted. No acute bony abnormality is seen. IMPRESSION: Bibasilar atelectasis left greater than right. Electronically Signed   By: Inez Catalina M.D.   On: 07/23/2022 19:46    Microbiology: Results for orders placed or performed during the hospital encounter of 07/23/22  Culture, blood (Routine x 2)     Status: None   Collection Time: 07/23/22  7:00 PM   Specimen: BLOOD  Result Value Ref Range Status   Specimen Description   Final    BLOOD RIGHT Performed at [facility], [address], [place], [place] [address]    Special Requests   Final    BOTTLES DRAWN AEROBIC AND ANAEROBIC Blood Culture adequate volume Performed at [facility], [address] [place], [place] [address]    Culture   Final  NO GROWTH 5 DAYS Performed at [facility], [place] [address], [place], [place]  [address]    Report Status 07/29/2022 FINAL  Final  Culture, blood (Routine x 2)     Status: None   Collection Time: 07/23/22  7:17 PM   Specimen: BLOOD  Result Value Ref Range Status   Specimen Description   Final    BLOOD RIGHT ANTECUBITAL Performed at [facility], [address], [place], [place] [address]    Special Requests   Final    Blood Culture adequate volume BOTTLES DRAWN AEROBIC AND ANAEROBIC Performed at [facility], [address] [place], [place] [address]    Culture   Final    NO GROWTH 5 DAYS Performed at [facility], [place] [address] [place], [place] [address]    Report Status 07/28/2022 FINAL  Final  Urine Culture     Status: Abnormal   Collection Time: 07/24/22 12:01 AM   Specimen: In/Out Cath Urine  Result Value Ref Range Status   Specimen Description   Final    IN/OUT CATH URINE Performed at [facility], [address] [place], [place] [address]    Special Requests   Final    NONE Performed at [facility], [place], [place] [address]    Culture 1,000 COLONIES/mL ESCHERICHIA COLI (A)  Final   Report Status 07/27/2022 FINAL  Final   Organism ID, Bacteria ESCHERICHIA COLI (A)  Final      Susceptibility   Escherichia coli - MIC*    AMPICILLIN 8 SENSITIVE Sensitive     CEFAZOLIN <=4 SENSITIVE Sensitive     CEFEPIME <=0.12 SENSITIVE Sensitive     CEFTRIAXONE <=0.25 SENSITIVE Sensitive     CIPROFLOXACIN <=0.25 SENSITIVE Sensitive     GENTAMICIN <=1 SENSITIVE Sensitive     IMIPENEM <=0.25 SENSITIVE Sensitive     NITROFURANTOIN <=16 SENSITIVE Sensitive     TRIMETH/SULFA >=320 RESISTANT Resistant     AMPICILLIN/SULBACTAM <=2 SENSITIVE Sensitive     PIP/TAZO <=4 SENSITIVE Sensitive     * 1,000 COLONIES/mL ESCHERICHIA COLI   Labs: CBC: Recent Labs  Lab 07/30/22 0207 07/31/22 0209 08/01/22 0131  WBC 16.5* 15.8* 16.2*  NEUTROABS  --  12.2* 11.6*   HGB 9.3* 8.8* 9.0*  HCT 28.2* 27.5* 28.4*  MCV 88.4 90.8 91.0  PLT 344 352 659   Basic Metabolic Panel: Recent Labs  Lab 07/30/22 0207 07/31/22 0209 08/01/22 0131  NA 137 136 136  K 3.6 3.6 3.3*  CL 110 108 110  CO2 21* 23 21*  GLUCOSE 126* 154* 226*  BUN 9 6* 6*  CREATININE 1.02* 1.05* 1.01*  CALCIUM 8.0* 7.9* 7.6*  MG  --  1.7 1.7   Liver Function Tests: No results for input(s): "AST", "ALT", "ALKPHOS", "BILITOT", "PROT", "ALBUMIN" in the last 168 hours. CBG: Recent Labs  Lab 07/31/22 0735 07/31/22 1118 07/31/22 1602 07/31/22 2051 08/01/22 0819  GLUCAP 129* 192* 151* 178* 119*    Discharge time spent: greater than 30 minutes.  Signed: Berle Mull, MD [facility] 08/01/2022

## 2022-08-11 ENCOUNTER — Encounter (INDEPENDENT_AMBULATORY_CARE_PROVIDER_SITE_OTHER): Payer: Medicare Other | Admitting: Ophthalmology

## 2022-08-11 DIAGNOSIS — H34812 Central retinal vein occlusion, left eye, with macular edema: Secondary | ICD-10-CM

## 2022-08-11 DIAGNOSIS — H35033 Hypertensive retinopathy, bilateral: Secondary | ICD-10-CM | POA: Diagnosis not present

## 2022-08-11 DIAGNOSIS — I10 Essential (primary) hypertension: Secondary | ICD-10-CM

## 2022-08-11 DIAGNOSIS — H43813 Vitreous degeneration, bilateral: Secondary | ICD-10-CM

## 2022-08-12 ENCOUNTER — Ambulatory Visit (INDEPENDENT_AMBULATORY_CARE_PROVIDER_SITE_OTHER): Payer: Medicare Other | Admitting: Nurse Practitioner

## 2022-08-12 ENCOUNTER — Encounter: Payer: Self-pay | Admitting: Nurse Practitioner

## 2022-08-12 VITALS — BP 122/78 | HR 89 | Temp 97.2°F | Ht 62.0 in | Wt 224.0 lb

## 2022-08-12 DIAGNOSIS — E876 Hypokalemia: Secondary | ICD-10-CM | POA: Diagnosis not present

## 2022-08-12 DIAGNOSIS — E1122 Type 2 diabetes mellitus with diabetic chronic kidney disease: Secondary | ICD-10-CM

## 2022-08-12 DIAGNOSIS — Z79899 Other long term (current) drug therapy: Secondary | ICD-10-CM

## 2022-08-12 DIAGNOSIS — R600 Localized edema: Secondary | ICD-10-CM

## 2022-08-12 DIAGNOSIS — Z09 Encounter for follow-up examination after completed treatment for conditions other than malignant neoplasm: Secondary | ICD-10-CM

## 2022-08-12 DIAGNOSIS — Z794 Long term (current) use of insulin: Secondary | ICD-10-CM

## 2022-08-12 DIAGNOSIS — N1832 Chronic kidney disease, stage 3b: Secondary | ICD-10-CM

## 2022-08-12 DIAGNOSIS — I83892 Varicose veins of left lower extremities with other complications: Secondary | ICD-10-CM

## 2022-08-12 DIAGNOSIS — I83029 Varicose veins of left lower extremity with ulcer of unspecified site: Secondary | ICD-10-CM

## 2022-08-12 DIAGNOSIS — L97929 Non-pressure chronic ulcer of unspecified part of left lower leg with unspecified severity: Secondary | ICD-10-CM

## 2022-08-12 DIAGNOSIS — D72828 Other elevated white blood cell count: Secondary | ICD-10-CM

## 2022-08-12 DIAGNOSIS — Z789 Other specified health status: Secondary | ICD-10-CM

## 2022-08-12 NOTE — Patient Instructions (Signed)
Soft-Food Eating Plan A soft-food eating plan includes foods that are safe and easy to chew and swallow. Your health care provider or dietitian can help you find foods and flavors that fit into this plan. Follow this plan until your health care provider or dietitian says it is safe to start eating other foods and food textures. What are tips for following this plan? Cooking Cook meats so they stay tender and moist. Use methods like braising, stewing, or baking in liquid. Cook vegetables and fruit until they are soft enough to be mashed with a fork. Peel soft, fresh fruits such as peaches, nectarines, and melons. When making soup, make sure chunks of meat and vegetables are smaller than  inch. Reheat leftover foods slowly so that a tough crust does not form. General information  Take small bites of food or cut food into pieces about  inch or smaller. Bite-sized pieces of food are easier to chew and swallow. Eat moist foods. Avoid overly dry foods. Avoid foods that: Are difficult to swallow, such as dry, chunky, crispy, or sticky foods. Are difficult to chew, such as hard, tough, or stringy foods. Contain nuts, seeds, or many types of uncooked fruit. Follow instructions from your dietitian about the types of liquids that are safe for you to swallow. You may be allowed to have: Thick liquids only. This includes only liquids that are thicker than honey. Thin and thick liquids. This includes all beverages and foods that become liquid at room temperature. To make thick liquids: Purchase a commercial liquid thickening powder. These are available at grocery stores and pharmacies. Mix the thickener into liquids according to instructions on the label. Purchase ready-made thickened liquids. Thicken soup by pureeing, straining to remove chunks, and adding flour, potato flakes, or corn starch. Add commercial thickener to foods that become liquid at room temperature, such as milk shakes, yogurt, ice  cream, gelatin, and sherbet. Ask your health care provider whether you need to take a fiber supplement. What foods should I eat? Fruits All canned and cooked fruits. Soft, peeled fresh fruits. Strawberries. Vegetables All soft-cooked vegetables. Shredded lettuce. Grains Breads, muffins, pancakes, or waffles moistened with syrup, jelly, or butter. Dry cereals well-moistened with milk. Moist, cooked cereals. Well-cooked pasta and rice. Meats and other proteins Tender, moist ground meat, poultry, or fish. Meat cooked in gravy or sauces. Eggs. Dairy Milk. Cream. Yogurt. Cottage cheese. Soft cheese without the rind. Sweets and desserts Ice cream. Milk shakes. Sherbet. Pudding. Fats and oils Butter. Margarine. Olive, canola, sunflower, and grapeseed oil. Smooth salad dressing. Smooth cream cheese. Mayonnaise. Gravy. The items listed above may not be a complete list of foods and beverages you can eat. Contact a dietitian for more information. What foods should I avoid? Fruits Fresh fruits with skins or seeds, or both, such as apples, pears, and grapes. Stringy, high-pulp fruits, such as papaya, pineapple, coconut, and mango. Fruit leather and all dried fruit. Vegetables All raw vegetables. Cooked corn. Cooked vegetables that are tough or stringy. Tough, crisp, fried potatoes and potato skins. Grains Coarse or dry cereals, such as bran, granola, and shredded wheat. Tough or chewy crusty breads, such as [place] bread or baguettes. Breads with nuts, seeds, or fruit. Meats and other proteins Hard, dry sausages. Dry meat, poultry, or fish. Meats with gristle. Fish with bones. Fried meat or fish. Lunch meat and hotdogs. Nuts and seeds. Chunky peanut butter or other nut butters. Dairy Yogurt with nuts or coconut. Sweets and desserts Cakes or cookies that  are very dry or chewy. Desserts with dried fruit, nuts, or coconut. Fried pastries. Very rich pastries. Fats and oils Cream cheese with fruit or  nuts. Salad dressings with seeds or chunks. The items listed above may not be a complete list of foods and beverages you should avoid. Contact a dietitian for more information. Summary A soft-food eating plan includes foods that are safe and easy to swallow. Generally, the foods should be soft enough to be mashed with a fork. Avoid foods that are dry, hard to chew, crunchy, sticky, stringy, or crispy. Ask your health care provider whether you need to thicken your liquids and if you need to take a fiber supplement. This information is not intended to replace advice given to you by your health care provider. Make sure you discuss any questions you have with your health care provider. Document Revised: 11/09/2020 Document Reviewed: 11/09/2020 Elsevier Patient Education  [employer].

## 2022-08-12 NOTE — Progress Notes (Signed)
Hospital follow up  Assessment and Plan: Hospital visit follow up for:   1. Hospital discharge follow-up All discharge instructions reviewed in full and confirmed. All questions and concerns addresses.  2. Other elevated white blood cell (WBC) count Continue to monitor for any increase in fever, chills, N/V. Report to ER or contact office if noted.  - CBC with Differential/Platelet  3. Hypokalemia Stay well hydrated.  - COMPLETE METABOLIC PANEL WITH GFR  4. Low calcium levels Stay well hydrated.  - COMPLETE METABOLIC PANEL WITH GFR  5. Type 2 diabetes mellitus with stage 3b chronic kidney disease, with long-term current use of insulin [facility]) Education: Reviewed 'ABCs' of diabetes management  Discussed goals to be met and/or maintained include A1C (<7) Blood pressure (<130/80) Cholesterol (LDL <70)  - CBC with Differential/Platelet - COMPLETE METABOLIC PANEL WITH GFR  6. Medication management All medications discussed and reviewed in full. All questions and concerns regarding medications addressed.    - CBC with Differential/Platelet - COMPLETE METABOLIC PANEL WITH GFR  7.  Venous stasis ulcer Continue wound management Cleanse wound on left lower leg with saline and pat dry. Then, apply a nickel thick layer of MediHoney directly to the wound or onto a dressing. Make certain that the dressing covers the entire wound base, but not in contact with the peri-wound skin (scar) and secure with gauze and silicone foam. Change dressing daily.  Continue to monitor Contact office if no improvement symptoms fail to improve.  8.  Presence of surgical incision (abdomen) UTA, covered with bandage.  Clean. Abdomen Wound care   Continue f/u with general surgery. Ralene Ok, MD (General Surgery) on 08/20/2022    Orders Placed This Encounter  Procedures   CBC with Differential/Platelet   COMPLETE METABOLIC PANEL WITH GFR    All medications were reviewed with patient and  family and fully reconciled. All questions answered fully, and patient and family members were encouraged to call the office with any further questions or concerns. Discussed goal to avoid readmission related to this   Over 30 minutes of exam, counseling, chart review, and complex, high/moderate level critical decision making was performed this visit.   Future Appointments  Date Time Provider [place]  09/15/2022  8:15 AM [name], MD [facility] None  10/30/2022  9:30 AM Debbora Presto, NP [facility] None  03/05/2023 10:00 AM Alycia Rossetti, NP [facility] None     HPI 76 y.o.female presents alongside daughter for follow up for transition from recent hospitalization stay. Admit date to the hospital was 07/23/22, patient was discharged from the hospital on 08/01/22 and our clinical staff contacted the office the day after discharge to set up a follow up appointment. The discharge summary, medications, and diagnostic test results were reviewed before meeting with the patient. The patient was admitted for:   There was concern for sepsis with marked leukocytosis and elevated lactate.  CT showed an incarcerated umbilical hernia with obstruction and attempt at reduction was unsuccessful.  She was transferred to [facility] for surgery and had NG tube placed with significant brown drainage out.  She was taken to the OR early was found to have a strangulated hernia with ischemic bowel.   SP small bowel resection and hernia repair on 07/24/2022.  Incarcerated hernia with ischemic bowel  infraumbilical hernia containing a small bowel loop with incarceration of proximal obstruction. S/P exploratory laparotomy with small bowel resection and hernia repair by Dr. Rosendo Gros 07/24/2022. Pathology with ischemic necrosis transmural, mesenteric venous thrombi no  edematous change or malignancy identified Management per general surgery. Completed Cefepime and metronidazole CT abdomen shows expected postop changes. Per  surgery patient will be discharged home as she is able to tolerate diet.  She is continuing to take in foods that are soft.  She denies N/V, abdominal pain. Outpatient follow-up with surgery.   Continue wound care.  Has HH and PT on board.  Acute renal failure on CKD stage IIIb: Resolved Patient presenting with a creatinine of 2.67 with peak of 3.76 during hospitalization.   Baseline creatinine 1.4-1.6.   Etiology likely secondary to prerenal azotemia in the setting of nausea and vomiting from bowel obstruction as above. Will check CMP today.   Acute postoperative blood loss anemia Hemoglobin 13.0 on admission, postoperatively down to 9.4; stable. Will check CBC today.   Essential hypertension Chronic diastolic congestive heart failure, compensated Home regimen includes atenolol 100 mg p.o. daily furosemide 40 mg p.o. daily, l, Finerenone 10 mg p.o. daily. She has stopped Losartan.  BP remains controlled.   Weakness/debility/deconditioning: PT/OT home health on board.  She continues to have left leg wound.  Wound change directions have been provided at discharge.  She will have [place], RN continue to manage.    She is managing pain with Tramadol.  Home health is involved.   Images while in the hospital: DG Abdomen 1 View  Result Date: 07/24/2022 CLINICAL DATA:  NG tube placement. EXAM: ABDOMEN - 1 VIEW COMPARISON:  CT earlier today. FINDINGS: The enteric tube tip and side-port below the diaphragm. The tube is looped in the distal stomach, tip directed towards the gastric cardia. Gaseous small bowel distention in the central abdomen is partially included. IMPRESSION: Enteric tube with tip and side-port below the diaphragm in the distal stomach. The tube is looped in the distal stomach, tip directed towards the gastric cardia. Electronically Signed   By: Keith Rake M.D.   On: 07/24/2022 01:09   CT ABDOMEN PELVIS WO CONTRAST  Result Date: 07/23/2022 CLINICAL DATA:  Acute abdominal  pain EXAM: CT ABDOMEN AND PELVIS WITHOUT CONTRAST TECHNIQUE: Multidetector CT imaging of the abdomen and pelvis was performed following the standard protocol without IV contrast. RADIATION DOSE REDUCTION: This exam was performed according to the departmental dose-optimization program which includes automated exposure control, adjustment of the mA and/or kV according to patient size and/or use of iterative reconstruction technique. COMPARISON:  Chest x-ray from earlier in the same day. FINDINGS: Lower chest: Patchy airspace opacity is noted in the bases right considerably greater than left with a more infiltrative pattern seen than that noted on prior chest x-ray. Hepatobiliary: Cholelithiasis is noted. The liver is within normal limits. Pancreas: Unremarkable. No pancreatic ductal dilatation or surrounding inflammatory changes. Spleen: Normal in size without focal abnormality. Adrenals/Urinary Tract: Adrenal glands are within normal limits. Kidneys are well visualized bilaterally. Tiny punctate stones are noted in the left lower pole. No obstructive changes are seen. The bladder is decompressed. Stomach/Bowel: No obstructive or inflammatory changes of the colon are seen. The appendix is within normal limits. Distal small bowel is decompressed. The stomach is distended with fluid and the proximal small bowel is distended as well. These changes are secondary to an infraumbilical abdominal wall hernia containing both fat and a knuckle of small bowel. This causes significant luminal narrowing at the hernia site and proximal partial small bowel obstruction. Vascular/Lymphatic: Aortic atherosclerosis. No enlarged abdominal or pelvic lymph nodes. Right femoral vein central line is noted. Reproductive: Uterus and bilateral adnexa are  unremarkable. Other: No ascites is noted. Musculoskeletal: No acute or significant osseous findings. IMPRESSION: Infraumbilical hernia containing a loop of small bowel with incarceration and  proximal obstruction. Surgical consultation is recommended. Bibasilar airspace opacity left greater than right. Tiny nonobstructing left renal calculi. Electronically Signed   By: Inez Catalina M.D.   On: 07/23/2022 22:43   DG Chest Port 1 View  Result Date: 07/23/2022 CLINICAL DATA:  Nausea and vomiting, initial encounter EXAM: PORTABLE CHEST 1 VIEW COMPARISON:  10/09/2008 FINDINGS: Cardiac shadow is stable. Bibasilar atelectatic changes are noted left greater than right. No sizable effusion is noted. No acute bony abnormality is seen. IMPRESSION: Bibasilar atelectasis left greater than right. Electronically Signed   By: Inez Catalina M.D.   On: 07/23/2022 19:46     Current Outpatient Medications (Endocrine & Metabolic):    Dulaglutide (TRULICITY) 3 KJ/1.7HX SOPN, Inject  3 MG(0.5 ML) into the skin  weekly for Diabetes (Dx: e10.29) (Patient taking differently: Inject 3 mg as directed every Sunday. Diabetes)   Finerenone (KERENDIA) 10 MG TABS, Take 10 mg by mouth daily. Take  1 tablet  Daily  for Diabetic Kidneys (Patient taking differently: Take 10 mg by mouth daily. Diabetic Kidneys)   insulin NPH-regular Human (70-30) 100 UNIT/ML injection, Patient injects 25 units in the AM and 10 units in the PM. Reduce each insulin dose by 2 units if glucose persistently <100 (Patient taking differently: Inject 5-20 Units into the skin See admin instructions. Patient injects 20 units in the AM and 5 units in the PM. Reduce each insulin dose by 2 units if glucose persistently <100)   levothyroxine (SYNTHROID) 125 MCG tablet, Take  1 tablet  Daily  on an empty stomach with only water for 30 minutes & no Antacid meds, Calcium or Magnesium for 4 hours & avoid Biotin (Patient taking differently: Take 125 mcg by mouth daily before breakfast. Take on an empty stomach with only water for 30 minutes & no Antacid meds, Calcium or Magnesium for 4 hours & avoid Biotin)  Current Outpatient Medications (Cardiovascular):     atenolol (TENORMIN) 100 MG tablet, Take 1 tablet by mouth once daily for blood pressure (Patient taking differently: Take 100 mg by mouth daily. blood pressure)   furosemide (LASIX) 80 MG tablet, TAKE 1 TABLET BY MOUTH 1 TO 2 TIMES PER DAY AS NEEDED FOR FLUID RETENTION OR ANKLE SWELLING   rosuvastatin (CRESTOR) 5 MG tablet, Take 1 tablet once weekly as directed for Cholesterol LDL goal less than 70. (Patient not taking: Reported on 07/25/2022)   Current Outpatient Medications (Analgesics):    acetaminophen (TYLENOL) 650 MG CR tablet, Take 1,300 mg by mouth every 8 (eight) hours as needed for pain.   allopurinol (ZYLOPRIM) 300 MG tablet, TAKE 1/2 TABLET(150 MG) BY MOUTH DAILY FOR GOUT PREVENTION (Patient taking differently: Take 150 mg by mouth daily. Gout prevention)   aspirin EC 81 MG tablet, Take 81 mg by mouth daily.   colchicine 0.6 MG tablet, Take  1 tablet  Daily  to Prevent Gout                                                                 /  TAKE                     BY                     MOUTH (Patient taking differently: Take 0.6 mg by mouth daily. Prevent Gout)   traMADol (ULTRAM) 50 MG tablet, Take 1 tablet (50 mg total) by mouth every 6 (six) hours as needed for moderate pain.  Current Outpatient Medications (Hematological):    Cyanocobalamin (B-12) 1000 MCG SUBL, Place 1,000 mcg under the tongue 2 (two) times a week. Saturday and Sunday  Current Outpatient Medications (Other):    Ascorbic Acid (VITAMIN C) 1000 MG tablet, Take 1,000 mg by mouth daily.   Cholecalciferol (VITAMIN D3) 5000 units CAPS, Take 5,000 Units by mouth daily.   dorzolamide-timolol (COSOPT) 22.3-6.8 MG/ML ophthalmic solution, Place 1 drop into the left eye 2 (two) times daily.   hyoscyamine (LEVSIN, ANASPAZ) 0.125 MG tablet, Take 1 tablet (0.125 mg total) by mouth every 6 (six) hours as needed. (Patient taking differently: Take 0.125 mg by mouth every 6 (six) hours as needed for bladder  spasms.)   Lancets (ONETOUCH ULTRASOFT) lancets, Check blood sugar 3 times daily-[id number]   ondansetron (ZOFRAN-ODT) 4 MG disintegrating tablet, Take 1 tablet (4 mg total) by mouth every 6 (six) hours as needed for nausea.   ONETOUCH ULTRA test strip, CHECK BLOOD SUGAR 3 TIMES A DAY   polymyxin B 500,000 Units in sodium chloride irrigation 0.9 % 500 mL, Irrigate with 1 Application as directed every 30 (thirty) days. Eye Injection   zinc gluconate 50 MG tablet, Take 50 mg by mouth daily.  Past Medical History:  Diagnosis Date   Allergic rhinitis    Allergy    Anxiety    with medical procedures   Hyperlipidemia    Hypertension    Obesity    OSA (obstructive sleep apnea)    wears CPAP   Type II or unspecified type diabetes mellitus with renal manifestations, not stated as uncontrolled(250.40)    Unspecified hypothyroidism    Vitamin D deficiency      Allergies  Allergen Reactions   Penicillins Swelling   Sulfa Antibiotics Itching and Rash    ROS: all negative except above.   Physical Exam: Filed Weights   08/12/22 0851  Weight: 224 lb (101.6 kg)   BP 122/78   Pulse 89   Temp (!) 97.2 F (36.2 C)   Ht $R'5\' 2"'nq$  (1.575 m)   Wt 224 lb (101.6 kg)   SpO2 92%   BMI 40.97 kg/m  General Appearance: Well nourished, in no apparent distress. Eyes: PERRLA, EOMs, conjunctiva no swelling or erythema Sinuses: No Frontal/maxillary tenderness ENT/Mouth: Ext aud canals clear, TMs without erythema, bulging. No erythema, swelling, or exudate on post pharynx.  Tonsils not swollen or erythematous. Hearing normal.  Neck: Supple, thyroid normal.  Respiratory: Respiratory effort normal, BS equal bilaterally without rales, rhonchi, wheezing or stridor.  Cardio: RRR with no MRGs. Brisk peripheral pulses without edema.  Abdomen: Soft, + BS.  Non tender, no guarding, rebound, hernias, masses. Lymphatics: Non tender without lymphadenopathy.  Musculoskeletal: Full ROM, 5/5 strength, normal gait.   Skin: Abdomen with dressing intact and WNL.  LLE with healing venous stasis ulcer.  Warm. Neuro: Cranial nerves intact. Normal muscle tone, no cerebellar symptoms. Sensation intact.  Psych: Awake and oriented X 3, normal affect, Insight and Judgment appropriate.     Darrol Jump, NP 9:06 AM [facility]  [facility]

## 2022-08-13 LAB — CBC WITH DIFFERENTIAL/PLATELET
Absolute Monocytes: 570 cells/uL (ref 200–950)
Basophils Absolute: 50 cells/uL (ref 0–200)
Basophils Relative: 0.8 %
Eosinophils Absolute: 341 cells/uL (ref 15–500)
Eosinophils Relative: 5.5 %
HCT: 30.8 % — ABNORMAL LOW (ref 35.0–45.0)
Hemoglobin: 10 g/dL — ABNORMAL LOW (ref 11.7–15.5)
Lymphs Abs: 1972 cells/uL (ref 850–3900)
MCH: 29.2 pg (ref 27.0–33.0)
MCHC: 32.5 g/dL (ref 32.0–36.0)
MCV: 89.8 fL (ref 80.0–100.0)
MPV: 10.3 fL (ref 7.5–12.5)
Monocytes Relative: 9.2 %
Neutro Abs: 3267 cells/uL (ref 1500–7800)
Neutrophils Relative %: 52.7 %
Platelets: 417 10*3/uL — ABNORMAL HIGH (ref 140–400)
RBC: 3.43 10*6/uL — ABNORMAL LOW (ref 3.80–5.10)
RDW: 16.2 % — ABNORMAL HIGH (ref 11.0–15.0)
Total Lymphocyte: 31.8 %
WBC: 6.2 10*3/uL (ref 3.8–10.8)

## 2022-08-13 LAB — COMPLETE METABOLIC PANEL WITH GFR
AG Ratio: 1.3 (calc) (ref 1.0–2.5)
ALT: 14 U/L (ref 6–29)
AST: 21 U/L (ref 10–35)
Albumin: 3.8 g/dL (ref 3.6–5.1)
Alkaline phosphatase (APISO): 64 U/L (ref 37–153)
BUN/Creatinine Ratio: 9 (calc) (ref 6–22)
BUN: 13 mg/dL (ref 7–25)
CO2: 30 mmol/L (ref 20–32)
Calcium: 9.3 mg/dL (ref 8.6–10.4)
Chloride: 103 mmol/L (ref 98–110)
Creat: 1.4 mg/dL — ABNORMAL HIGH (ref 0.60–1.00)
Globulin: 3 g/dL (calc) (ref 1.9–3.7)
Glucose, Bld: 112 mg/dL — ABNORMAL HIGH (ref 65–99)
Potassium: 3.8 mmol/L (ref 3.5–5.3)
Sodium: 143 mmol/L (ref 135–146)
Total Bilirubin: 0.9 mg/dL (ref 0.2–1.2)
Total Protein: 6.8 g/dL (ref 6.1–8.1)
eGFR: 39 mL/min/{1.73_m2} — ABNORMAL LOW (ref >=60)

## 2022-08-25 NOTE — Progress Notes (Deleted)
Hospital follow up  Assessment and Plan: Hospital visit follow up for:   1. Hospital discharge follow-up All discharge instructions reviewed in full and confirmed. All questions and concerns addresses.  2. Other elevated white blood cell (WBC) count Continue to monitor for any increase in fever, chills, N/V. Report to ER or contact office if noted.  - CBC with Differential/Platelet  3. Hypokalemia Stay well hydrated.  - COMPLETE METABOLIC PANEL WITH GFR  4. Low calcium levels Stay well hydrated.  - COMPLETE METABOLIC PANEL WITH GFR  5. Type 2 diabetes mellitus with stage 3b chronic kidney disease, with long-term current use of insulin [facility]) Education: Reviewed 'ABCs' of diabetes management  Discussed goals to be met and/or maintained include A1C (<7) Blood pressure (<130/80) Cholesterol (LDL <70)  - CBC with Differential/Platelet - COMPLETE METABOLIC PANEL WITH GFR  6. Medication management All medications discussed and reviewed in full. All questions and concerns regarding medications addressed.    - CBC with Differential/Platelet - COMPLETE METABOLIC PANEL WITH GFR  7.  Venous stasis ulcer Continue wound management Cleanse wound on left lower leg with saline and pat dry. Then, apply a nickel thick layer of MediHoney directly to the wound or onto a dressing. Make certain that the dressing covers the entire wound base, but not in contact with the peri-wound skin (scar) and secure with gauze and silicone foam. Change dressing daily.  Continue to monitor Contact office if no improvement symptoms fail to improve.  8.  Presence of surgical incision (abdomen) UTA, covered with bandage.  Clean. Abdomen Wound care   Continue f/u with general surgery. Ralene Ok, MD (General Surgery) on 08/20/2022    No orders of the defined types were placed in this encounter.   All medications were reviewed with patient and family and fully reconciled. All questions answered  fully, and patient and family members were encouraged to call the office with any further questions or concerns. Discussed goal to avoid readmission related to this   Over 30 minutes of exam, counseling, chart review, and complex, high/moderate level critical decision making was performed this visit.   Future Appointments  Date Time Provider [place]  08/26/2022 10:30 AM Darrol Jump, NP [facility] None  09/15/2022  8:15 AM [name], MD [facility] None  10/30/2022  9:30 AM Debbora Presto, NP [facility] None  03/05/2023 10:00 AM Alycia Rossetti, NP [facility] None     HPI 76 y.o.female presents alongside daughter for follow up for transition from recent hospitalization stay. Admit date to the hospital was 07/23/22, patient was discharged from the hospital on 08/01/22 and our clinical staff contacted the office the day after discharge to set up a follow up appointment. The discharge summary, medications, and diagnostic test results were reviewed before meeting with the patient. The patient was admitted for:   There was concern for sepsis with marked leukocytosis and elevated lactate.  CT showed an incarcerated umbilical hernia with obstruction and attempt at reduction was unsuccessful.  She was transferred to [facility] for surgery and had NG tube placed with significant brown drainage out.  She was taken to the OR early was found to have a strangulated hernia with ischemic bowel.   SP small bowel resection and hernia repair on 07/24/2022.  Incarcerated hernia with ischemic bowel  infraumbilical hernia containing a small bowel loop with incarceration of proximal obstruction. S/P exploratory laparotomy with small bowel resection and hernia repair by Dr. Rosendo Gros 07/24/2022. Pathology with ischemic necrosis transmural, mesenteric venous thrombi  no edematous change or malignancy identified Management per general surgery. Completed Cefepime and metronidazole CT abdomen shows expected postop changes. Per  surgery patient will be discharged home as she is able to tolerate diet.  She is continuing to take in foods that are soft.  She denies N/V, abdominal pain. Outpatient follow-up with surgery.   Continue wound care.  Has HH and PT on board.  Acute renal failure on CKD stage IIIb: Resolved Patient presenting with a creatinine of 2.67 with peak of 3.76 during hospitalization.   Baseline creatinine 1.4-1.6.   Etiology likely secondary to prerenal azotemia in the setting of nausea and vomiting from bowel obstruction as above. Will check CMP today.   Acute postoperative blood loss anemia Hemoglobin 13.0 on admission, postoperatively down to 9.4; stable. Will check CBC today.   Essential hypertension Chronic diastolic congestive heart failure, compensated Home regimen includes atenolol 100 mg p.o. daily furosemide 40 mg p.o. daily, l, Finerenone 10 mg p.o. daily. She has stopped Losartan.  BP remains controlled.   Weakness/debility/deconditioning: PT/OT home health on board.  She continues to have left leg wound.  Wound change directions have been provided at discharge.  She will have [place], RN continue to manage.    She is managing pain with Tramadol.  Home health is involved.   Images while in the hospital: DG Abdomen 1 View  Result Date: 07/24/2022 CLINICAL DATA:  NG tube placement. EXAM: ABDOMEN - 1 VIEW COMPARISON:  CT earlier today. FINDINGS: The enteric tube tip and side-port below the diaphragm. The tube is looped in the distal stomach, tip directed towards the gastric cardia. Gaseous small bowel distention in the central abdomen is partially included. IMPRESSION: Enteric tube with tip and side-port below the diaphragm in the distal stomach. The tube is looped in the distal stomach, tip directed towards the gastric cardia. Electronically Signed   By: Keith Rake M.D.   On: 07/24/2022 01:09   CT ABDOMEN PELVIS WO CONTRAST  Result Date: 07/23/2022 CLINICAL DATA:  Acute abdominal  pain EXAM: CT ABDOMEN AND PELVIS WITHOUT CONTRAST TECHNIQUE: Multidetector CT imaging of the abdomen and pelvis was performed following the standard protocol without IV contrast. RADIATION DOSE REDUCTION: This exam was performed according to the departmental dose-optimization program which includes automated exposure control, adjustment of the mA and/or kV according to patient size and/or use of iterative reconstruction technique. COMPARISON:  Chest x-ray from earlier in the same day. FINDINGS: Lower chest: Patchy airspace opacity is noted in the bases right considerably greater than left with a more infiltrative pattern seen than that noted on prior chest x-ray. Hepatobiliary: Cholelithiasis is noted. The liver is within normal limits. Pancreas: Unremarkable. No pancreatic ductal dilatation or surrounding inflammatory changes. Spleen: Normal in size without focal abnormality. Adrenals/Urinary Tract: Adrenal glands are within normal limits. Kidneys are well visualized bilaterally. Tiny punctate stones are noted in the left lower pole. No obstructive changes are seen. The bladder is decompressed. Stomach/Bowel: No obstructive or inflammatory changes of the colon are seen. The appendix is within normal limits. Distal small bowel is decompressed. The stomach is distended with fluid and the proximal small bowel is distended as well. These changes are secondary to an infraumbilical abdominal wall hernia containing both fat and a knuckle of small bowel. This causes significant luminal narrowing at the hernia site and proximal partial small bowel obstruction. Vascular/Lymphatic: Aortic atherosclerosis. No enlarged abdominal or pelvic lymph nodes. Right femoral vein central line is noted. Reproductive: Uterus and bilateral adnexa  are unremarkable. Other: No ascites is noted. Musculoskeletal: No acute or significant osseous findings. IMPRESSION: Infraumbilical hernia containing a loop of small bowel with incarceration and  proximal obstruction. Surgical consultation is recommended. Bibasilar airspace opacity left greater than right. Tiny nonobstructing left renal calculi. Electronically Signed   By: Inez Catalina M.D.   On: 07/23/2022 22:43   DG Chest Port 1 View  Result Date: 07/23/2022 CLINICAL DATA:  Nausea and vomiting, initial encounter EXAM: PORTABLE CHEST 1 VIEW COMPARISON:  10/09/2008 FINDINGS: Cardiac shadow is stable. Bibasilar atelectatic changes are noted left greater than right. No sizable effusion is noted. No acute bony abnormality is seen. IMPRESSION: Bibasilar atelectasis left greater than right. Electronically Signed   By: Inez Catalina M.D.   On: 07/23/2022 19:46     Current Outpatient Medications (Endocrine & Metabolic):    Dulaglutide (TRULICITY) 3 AU/6.3FH SOPN, Inject  3 MG(0.5 ML) into the skin  weekly for Diabetes (Dx: e10.29) (Patient taking differently: Inject 3 mg as directed every Sunday. Diabetes)   Finerenone (KERENDIA) 10 MG TABS, Take 10 mg by mouth daily. Take  1 tablet  Daily  for Diabetic Kidneys (Patient taking differently: Take 10 mg by mouth daily. Diabetic Kidneys)   insulin NPH-regular Human (70-30) 100 UNIT/ML injection, Patient injects 25 units in the AM and 10 units in the PM. Reduce each insulin dose by 2 units if glucose persistently <100 (Patient taking differently: Inject 5-20 Units into the skin See admin instructions. Patient injects 20 units in the AM and 5 units in the PM. Reduce each insulin dose by 2 units if glucose persistently <100)   levothyroxine (SYNTHROID) 125 MCG tablet, Take  1 tablet  Daily  on an empty stomach with only water for 30 minutes & no Antacid meds, Calcium or Magnesium for 4 hours & avoid Biotin (Patient taking differently: Take 125 mcg by mouth daily before breakfast. Take on an empty stomach with only water for 30 minutes & no Antacid meds, Calcium or Magnesium for 4 hours & avoid Biotin)  Current Outpatient Medications (Cardiovascular):     atenolol (TENORMIN) 100 MG tablet, Take 1 tablet by mouth once daily for blood pressure (Patient taking differently: Take 100 mg by mouth daily. blood pressure)   furosemide (LASIX) 80 MG tablet, TAKE 1 TABLET BY MOUTH 1 TO 2 TIMES PER DAY AS NEEDED FOR FLUID RETENTION OR ANKLE SWELLING   rosuvastatin (CRESTOR) 5 MG tablet, Take 1 tablet once weekly as directed for Cholesterol LDL goal less than 70. (Patient not taking: Reported on 07/25/2022)   Current Outpatient Medications (Analgesics):    acetaminophen (TYLENOL) 650 MG CR tablet, Take 1,300 mg by mouth every 8 (eight) hours as needed for pain.   allopurinol (ZYLOPRIM) 300 MG tablet, TAKE 1/2 TABLET(150 MG) BY MOUTH DAILY FOR GOUT PREVENTION (Patient taking differently: Take 150 mg by mouth daily. Gout prevention)   aspirin EC 81 MG tablet, Take 81 mg by mouth daily.   colchicine 0.6 MG tablet, Take  1 tablet  Daily  to Prevent Gout                                                                 /  TAKE                     BY                     MOUTH (Patient taking differently: Take 0.6 mg by mouth daily. Prevent Gout)   traMADol (ULTRAM) 50 MG tablet, Take 1 tablet (50 mg total) by mouth every 6 (six) hours as needed for moderate pain.  Current Outpatient Medications (Hematological):    Cyanocobalamin (B-12) 1000 MCG SUBL, Place 1,000 mcg under the tongue 2 (two) times a week. Saturday and Sunday  Current Outpatient Medications (Other):    Ascorbic Acid (VITAMIN C) 1000 MG tablet, Take 1,000 mg by mouth daily.   Cholecalciferol (VITAMIN D3) 5000 units CAPS, Take 5,000 Units by mouth daily.   dorzolamide-timolol (COSOPT) 22.3-6.8 MG/ML ophthalmic solution, Place 1 drop into the left eye 2 (two) times daily.   hyoscyamine (LEVSIN, ANASPAZ) 0.125 MG tablet, Take 1 tablet (0.125 mg total) by mouth every 6 (six) hours as needed. (Patient taking differently: Take 0.125 mg by mouth every 6 (six) hours as needed for bladder  spasms.)   Lancets (ONETOUCH ULTRASOFT) lancets, Check blood sugar 3 times daily-DX-E10.9   ondansetron (ZOFRAN-ODT) 4 MG disintegrating tablet, Take 1 tablet (4 mg total) by mouth every 6 (six) hours as needed for nausea.   ONETOUCH ULTRA test strip, CHECK BLOOD SUGAR 3 TIMES A DAY   polymyxin B 500,000 Units in sodium chloride irrigation 0.9 % 500 mL, Irrigate with 1 Application as directed every 30 (thirty) days. Eye Injection   zinc gluconate 50 MG tablet, Take 50 mg by mouth daily.  Past Medical History:  Diagnosis Date   Allergic rhinitis    Allergy    Anxiety    with medical procedures   Hyperlipidemia    Hypertension    Obesity    OSA (obstructive sleep apnea)    wears CPAP   Type II or unspecified type diabetes mellitus with renal manifestations, not stated as uncontrolled(250.40)    Unspecified hypothyroidism    Vitamin D deficiency      Allergies  Allergen Reactions   Penicillins Swelling   Sulfa Antibiotics Itching and Rash    ROS: all negative except above.   Physical Exam: There were no vitals filed for this visit.  There were no vitals taken for this visit. General Appearance: Well nourished, in no apparent distress. Eyes: PERRLA, EOMs, conjunctiva no swelling or erythema Sinuses: No Frontal/maxillary tenderness ENT/Mouth: Ext aud canals clear, TMs without erythema, bulging. No erythema, swelling, or exudate on post pharynx.  Tonsils not swollen or erythematous. Hearing normal.  Neck: Supple, thyroid normal.  Respiratory: Respiratory effort normal, BS equal bilaterally without rales, rhonchi, wheezing or stridor.  Cardio: RRR with no MRGs. Brisk peripheral pulses without edema.  Abdomen: Soft, + BS.  Non tender, no guarding, rebound, hernias, masses. Lymphatics: Non tender without lymphadenopathy.  Musculoskeletal: Full ROM, 5/5 strength, normal gait.  Skin: Abdomen with dressing intact and WNL.  LLE with healing venous stasis ulcer.  Warm. Neuro: Cranial  nerves intact. Normal muscle tone, no cerebellar symptoms. Sensation intact.  Psych: Awake and oriented X 3, normal affect, Insight and Judgment appropriate.     Darrol Jump, NP 10:34 PM [facility]

## 2022-08-26 ENCOUNTER — Ambulatory Visit: Payer: Medicare Other | Admitting: Nurse Practitioner

## 2022-09-08 ENCOUNTER — Ambulatory Visit (INDEPENDENT_AMBULATORY_CARE_PROVIDER_SITE_OTHER): Payer: Medicare Other | Admitting: Nurse Practitioner

## 2022-09-08 ENCOUNTER — Encounter: Payer: Self-pay | Admitting: Nurse Practitioner

## 2022-09-08 VITALS — BP 114/72 | HR 92 | Temp 97.3°F | Ht 62.0 in | Wt 220.0 lb

## 2022-09-08 DIAGNOSIS — Z794 Long term (current) use of insulin: Secondary | ICD-10-CM

## 2022-09-08 DIAGNOSIS — B372 Candidiasis of skin and nail: Secondary | ICD-10-CM

## 2022-09-08 DIAGNOSIS — N1832 Chronic kidney disease, stage 3b: Secondary | ICD-10-CM

## 2022-09-08 DIAGNOSIS — D649 Anemia, unspecified: Secondary | ICD-10-CM

## 2022-09-08 DIAGNOSIS — I878 Other specified disorders of veins: Secondary | ICD-10-CM | POA: Diagnosis not present

## 2022-09-08 DIAGNOSIS — I10 Essential (primary) hypertension: Secondary | ICD-10-CM

## 2022-09-08 DIAGNOSIS — Z789 Other specified health status: Secondary | ICD-10-CM

## 2022-09-08 DIAGNOSIS — Z79899 Other long term (current) drug therapy: Secondary | ICD-10-CM

## 2022-09-08 DIAGNOSIS — Z1389 Encounter for screening for other disorder: Secondary | ICD-10-CM

## 2022-09-08 DIAGNOSIS — R093 Abnormal sputum: Secondary | ICD-10-CM

## 2022-09-08 DIAGNOSIS — E1122 Type 2 diabetes mellitus with diabetic chronic kidney disease: Secondary | ICD-10-CM

## 2022-09-08 MED ORDER — NYSTATIN 100000 UNIT/GM EX POWD: 1.0000 | Freq: Three times a day (TID) | CUTANEOUS | 0 refills | Status: DC

## 2022-09-08 MED ORDER — NYSTATIN 100000 UNIT/GM EX CREA: 1.0000 | TOPICAL_CREAM | Freq: Two times a day (BID) | CUTANEOUS | 2 refills | Status: DC

## 2022-09-08 NOTE — Progress Notes (Signed)
+1 MONTH FOLLOW UP  Assessment and Plan: Hospital visit follow up for:   Type 2 diabetes mellitus with stage 3b chronic kidney disease, with long-term current use of insulin (HCC) In home sugars to remain well controlled between 120-150. Education: Reviewed 'ABCs' of diabetes management  Discussed goals to be met and/or maintained include A1C (<7) Blood pressure (<130/80) Cholesterol (LDL <70) Continue ACE, bASA, statin Discussed how what you eat and drink can aide in kidney protection. Stay well hydrated. Avoid high salt foods. Avoid NSAIDS. Keep BP and BG well controlled.   Take medications as prescribed. Remain active and exercise as tolerated daily. Maintain weight.  Continue to monitor. Check and monitor CMP/GFR   Medication management All medications discussed and reviewed in full. All questions and concerns regarding medications addressed.     Venous stasis ulcer Continue wound management Cleanse wound on left lower leg with saline and pat dry. Then, apply a nickel thick layer of MediHoney directly to the wound or onto a dressing. Make certain that the dressing covers the entire wound base, but not in contact with the peri-wound skin (scar) and secure with gauze and silicone foam. Change dressing daily.  Continue to monitor Contact office if no improvement symptoms fail to improve. Continue protein supplement. Will reach out to DME company for possible delivery.  Presence of surgical incision (abdomen) UTA, covered with bandage.   Follow up with General Surgery PRN Continue Abdomen Wound care   Monitor for any increase in s/s that relate to increase infection such as redness, pain, swelling, foul odor.  Contact office if noticed. Continue protein supplement. Will reach out to DME company for possible delivery.  Low Hemoglobin Monitor CBC   Hypertension Continues not to take Losartan Discussed DASH (Dietary Approaches to Stop Hypertension) DASH diet is lower  in sodium than a typical American diet. Cut back on foods that are high in saturated fat, cholesterol, and trans fats. Eat more whole-grain foods, fish, poultry, and nuts Remain active and exercise as tolerated daily.  Monitor BP at home-Call if greater than 130/80.  Check and monitor CMP/CBC  Screening for hematuria or proteinuria Check and monitor UA  Skin Candidiasis Start topical Nystatin Suggested InterDry pads - can purchase from [employer]. Keep area dry and free of moisture. Continue to monitor  Thick sputum Reach out to DME for portable suction. Stay well hydrated to keep secretions thin  All medications were reviewed with patient and family and fully reconciled. All questions answered fully, and patient and family members were encouraged to call the office with any further questions or concerns. Discussed goal to avoid readmission related to this   Over 20 minutes of exam, counseling, chart review, and complex, high/moderate level critical decision making was performed this visit.   Future Appointments  Date Time Provider [place]  09/15/2022  8:15 AM [name], MD [facility] None  09/18/2022 10:00 AM Darrol Jump, NP [facility] None  09/22/2022 10:00 AM Darrol Jump, NP [facility] None  10/30/2022  9:30 AM Debbora Presto, NP [facility] None  03/05/2023 10:00 AM Alycia Rossetti, NP [facility] None     HPI 76 y.o.female presents alongside friend for one month follow up.  Overall she reports feeling well.  Home Health has signed off on further treatment.  Her wound  is almost healed.  Husband is continuing to pack wound.  She is continuing to take in food, able to eat most foods, eating small amounts, continuing to lose weight.  Trying to  take in supplemental nutrition but reports hard to find locally.  The RLE venous stasis ulcer is also well healed. BG and BP are staying controlled.  She is continuing to increase her activity.  She is concerned for an area under  the right breast that is itchy and moist.  Malodor.  She has not treated with anything at this time.    She notices that her mouth gets full of spit, she gets strangled and stuck at the back of her mouth and throat. She has cup beside for spitting, but does not help.    BMI is Body mass index is 40.24 kg/m., she  bhas noteen working on diet and exercise. Wt Readings from Last 3 Encounters:  09/09/22 220 lb (99.8 kg)  09/08/22 220 lb (99.8 kg)  08/12/22 224 lb (101.6 kg)    Current Outpatient Medications (Endocrine & Metabolic):    Dulaglutide (TRULICITY) 3 XN/2.3FT SOPN, Inject  3 MG(0.5 ML) into the skin  weekly for Diabetes (Dx: e10.29) (Patient taking differently: Inject 3 mg as directed every Sunday. Diabetes)   Finerenone (KERENDIA) 10 MG TABS, Take 10 mg by mouth daily. Take  1 tablet  Daily  for Diabetic Kidneys (Patient taking differently: Take 10 mg by mouth daily. Diabetic Kidneys)   insulin NPH-regular Human (70-30) 100 UNIT/ML injection, Patient injects 25 units in the AM and 10 units in the PM. Reduce each insulin dose by 2 units if glucose persistently <100 (Patient taking differently: Inject 5-20 Units into the skin See admin instructions. Patient injects 20 units in the AM and 5 units in the PM. Reduce each insulin dose by 2 units if glucose persistently <100)   levothyroxine (SYNTHROID) 125 MCG tablet, Take  1 tablet  Daily  on an empty stomach with only water for 30 minutes & no Antacid meds, Calcium or Magnesium for 4 hours & avoid Biotin (Patient taking differently: Take 125 mcg by mouth daily before breakfast. Take on an empty stomach with only water for 30 minutes & no Antacid meds, Calcium or Magnesium for 4 hours & avoid Biotin)  Current Outpatient Medications (Cardiovascular):    atenolol (TENORMIN) 100 MG tablet, Take 1 tablet by mouth once daily for blood pressure (Patient taking differently: Take 100 mg by mouth daily. blood pressure)   furosemide (LASIX) 80 MG tablet,  TAKE 1 TABLET BY MOUTH 1 TO 2 TIMES PER DAY AS NEEDED FOR FLUID RETENTION OR ANKLE SWELLING   rosuvastatin (CRESTOR) 5 MG tablet, Take 1 tablet once weekly as directed for Cholesterol LDL goal less than 70.   Current Outpatient Medications (Analgesics):    acetaminophen (TYLENOL) 650 MG CR tablet, Take 1,300 mg by mouth every 8 (eight) hours as needed for pain.   allopurinol (ZYLOPRIM) 300 MG tablet, TAKE 1/2 TABLET(150 MG) BY MOUTH DAILY FOR GOUT PREVENTION (Patient taking differently: Take 150 mg by mouth daily. Gout prevention)   aspirin EC 81 MG tablet, Take 81 mg by mouth daily.   colchicine 0.6 MG tablet, Take  1 tablet  Daily  to Prevent Gout                                                                 /  TAKE                     BY                     MOUTH (Patient taking differently: Take 0.6 mg by mouth daily. Prevent Gout)   traMADol (ULTRAM) 50 MG tablet, Take 1 tablet (50 mg total) by mouth every 6 (six) hours as needed for moderate pain.  Current Outpatient Medications (Hematological):    Cyanocobalamin (B-12) 1000 MCG SUBL, Place 1,000 mcg under the tongue 2 (two) times a week. Saturday and Sunday  Current Outpatient Medications (Other):    Ascorbic Acid (VITAMIN C) 1000 MG tablet, Take 1,000 mg by mouth daily.   Cholecalciferol (VITAMIN D3) 5000 units CAPS, Take 5,000 Units by mouth daily.   dorzolamide-timolol (COSOPT) 22.3-6.8 MG/ML ophthalmic solution, Place 1 drop into the left eye 2 (two) times daily.   hyoscyamine (LEVSIN, ANASPAZ) 0.125 MG tablet, Take 1 tablet (0.125 mg total) by mouth every 6 (six) hours as needed. (Patient taking differently: Take 0.125 mg by mouth every 6 (six) hours as needed for bladder spasms.)   Lancets (ONETOUCH ULTRASOFT) lancets, Check blood sugar 3 times daily-DX-E10.9   nystatin (MYCOSTATIN/NYSTOP) powder, Apply 1 Application topically 3 (three) times daily.   nystatin cream (MYCOSTATIN), Apply 1 Application  topically 2 (two) times daily.   ondansetron (ZOFRAN-ODT) 4 MG disintegrating tablet, Take 1 tablet (4 mg total) by mouth every 6 (six) hours as needed for nausea.   ONETOUCH ULTRA test strip, CHECK BLOOD SUGAR 3 TIMES A DAY   polymyxin B 500,000 Units in sodium chloride irrigation 0.9 % 500 mL, Irrigate with 1 Application as directed every 30 (thirty) days. Eye Injection   zinc gluconate 50 MG tablet, Take 50 mg by mouth daily.   cephALEXin (KEFLEX) 500 MG capsule, Take 1 capsule (500 mg total) by mouth 3 (three) times daily for 5 days.  Past Medical History:  Diagnosis Date   Allergic rhinitis    Allergy    Anxiety    with medical procedures   Hyperlipidemia    Hypertension    Obesity    OSA (obstructive sleep apnea)    wears CPAP   Type II or unspecified type diabetes mellitus with renal manifestations, not stated as uncontrolled(250.40)    Unspecified hypothyroidism    Vitamin D deficiency      Allergies  Allergen Reactions   Penicillins Swelling   Sulfa Antibiotics Itching and Rash    ROS: all negative except above.   Physical Exam: Filed Weights   09/08/22 1038  Weight: 220 lb (99.8 kg)    BP 114/72   Pulse 92   Temp (!) 97.3 F (36.3 C)   Ht $R'5\' 2"'hf$  (1.575 m)   Wt 220 lb (99.8 kg)   SpO2 98%   BMI 40.24 kg/m  General Appearance: Well nourished, in no apparent distress. Eyes: PERRLA, EOMs, conjunctiva no swelling or erythema Sinuses: No Frontal/maxillary tenderness ENT/Mouth: Ext aud canals clear, TMs without erythema, bulging. No erythema, swelling, or exudate on post pharynx.  Tonsils not swollen or erythematous. Hearing normal.  Neck: Supple, thyroid normal.  Respiratory: Respiratory effort normal, BS equal bilaterally without rales, rhonchi, wheezing or stridor.  Cardio: RRR with no MRGs. Brisk peripheral pulses without edema.  Abdomen: Soft, + BS.  Non tender, no guarding, rebound, hernias, masses. Lymphatics: Non tender without lymphadenopathy.   Musculoskeletal: Full ROM, 5/5 strength, normal gait.  Skin: Abdomen  with dressing intact and WNL.  LLE with healing venous stasis ulcer.  Warm.  Left breast with moderate erythema, moist, cottage cheese like substance, grayish in color, malodorous. Neuro: Cranial nerves intact. Normal muscle tone, no cerebellar symptoms. Sensation intact.  Psych: Awake and oriented X 3, normal affect, Insight and Judgment appropriate.     Darrol Jump, NP 9:56 PM [facility]

## 2022-09-09 ENCOUNTER — Other Ambulatory Visit (HOSPITAL_BASED_OUTPATIENT_CLINIC_OR_DEPARTMENT_OTHER): Payer: Self-pay

## 2022-09-09 ENCOUNTER — Emergency Department (HOSPITAL_BASED_OUTPATIENT_CLINIC_OR_DEPARTMENT_OTHER)
Admission: EM | Admit: 2022-09-09 | Discharge: 2022-09-09 | Disposition: A | Discharge status: Home / Self Care | Payer: Medicare Other | Attending: Emergency Medicine | Admitting: Emergency Medicine

## 2022-09-09 ENCOUNTER — Other Ambulatory Visit: Payer: Self-pay

## 2022-09-09 ENCOUNTER — Encounter (HOSPITAL_BASED_OUTPATIENT_CLINIC_OR_DEPARTMENT_OTHER): Payer: Self-pay | Admitting: Emergency Medicine

## 2022-09-09 DIAGNOSIS — D72829 Elevated white blood cell count, unspecified: Secondary | ICD-10-CM | POA: Diagnosis not present

## 2022-09-09 DIAGNOSIS — N3000 Acute cystitis without hematuria: Secondary | ICD-10-CM | POA: Diagnosis present

## 2022-09-09 LAB — CBC WITH DIFFERENTIAL/PLATELET
Abs Immature Granulocytes: 0.6 10*3/uL — ABNORMAL HIGH (ref 0.00–0.07)
Absolute Monocytes: 1116 cells/uL — ABNORMAL HIGH (ref 200–950)
Basophils Absolute: 130 cells/uL (ref 0–200)
Basophils Absolute: 0.2 10*3/uL — ABNORMAL HIGH (ref 0.0–0.1)
Basophils Relative: 0.7 %
Basophils Relative: 1 %
Eosinophils Absolute: 818 cells/uL — ABNORMAL HIGH (ref 15–500)
Eosinophils Absolute: 0 10*3/uL (ref 0.0–0.5)
Eosinophils Relative: 4.4 %
Eosinophils Relative: 0 %
HCT: 31.3 % — ABNORMAL LOW (ref 35.0–45.0)
HCT: 32.9 % — ABNORMAL LOW (ref 36.0–46.0)
Hemoglobin: 10.3 g/dL — ABNORMAL LOW (ref 11.7–15.5)
Hemoglobin: 10.7 g/dL — ABNORMAL LOW (ref 12.0–15.0)
Lymphocytes Relative: 16 %
Lymphs Abs: 2213 cells/uL (ref 850–3900)
Lymphs Abs: 2.5 10*3/uL (ref 0.7–4.0)
MCH: 28.9 pg (ref 27.0–33.0)
MCH: 28.8 pg (ref 26.0–34.0)
MCHC: 32.9 g/dL (ref 32.0–36.0)
MCHC: 32.5 g/dL (ref 30.0–36.0)
MCV: 87.7 fL (ref 80.0–100.0)
MCV: 88.4 fL (ref 80.0–100.0)
MPV: 12.2 fL (ref 7.5–12.5)
Metamyelocytes Relative: 1 %
Monocytes Absolute: 1.1 10*3/uL — ABNORMAL HIGH (ref 0.1–1.0)
Monocytes Relative: 6 %
Monocytes Relative: 7 %
Myelocytes: 3 %
Neutro Abs: 14322 cells/uL — ABNORMAL HIGH (ref 1500–7800)
Neutro Abs: 11.4 10*3/uL — ABNORMAL HIGH (ref 1.7–7.7)
Neutrophils Relative %: 77 %
Neutrophils Relative %: 72 %
Platelets: 468 10*3/uL — ABNORMAL HIGH (ref 140–400)
Platelets: 486 10*3/uL — ABNORMAL HIGH (ref 150–400)
RBC: 3.57 10*6/uL — ABNORMAL LOW (ref 3.80–5.10)
RBC: 3.72 MIL/uL — ABNORMAL LOW (ref 3.87–5.11)
RDW: 15.9 % — ABNORMAL HIGH (ref 11.0–15.0)
RDW: 15.9 % — ABNORMAL HIGH (ref 11.5–15.5)
Smear Review: NORMAL
Total Lymphocyte: 11.9 %
WBC: 18.6 10*3/uL — ABNORMAL HIGH (ref 3.8–10.8)
WBC: 15.8 10*3/uL — ABNORMAL HIGH (ref 4.0–10.5)
nRBC: 0 % (ref 0.0–0.2)

## 2022-09-09 LAB — COMPLETE METABOLIC PANEL WITH GFR
AG Ratio: 0.9 (calc) — ABNORMAL LOW (ref 1.0–2.5)
ALT: 17 U/L (ref 6–29)
AST: 18 U/L (ref 10–35)
Albumin: 3.2 g/dL — ABNORMAL LOW (ref 3.6–5.1)
Alkaline phosphatase (APISO): 69 U/L (ref 37–153)
BUN/Creatinine Ratio: 23 (calc) — ABNORMAL HIGH (ref 6–22)
BUN: 41 mg/dL — ABNORMAL HIGH (ref 7–25)
CO2: 30 mmol/L (ref 20–32)
Calcium: 8.8 mg/dL (ref 8.6–10.4)
Chloride: 98 mmol/L (ref 98–110)
Creat: 1.78 mg/dL — ABNORMAL HIGH (ref 0.60–1.00)
Globulin: 3.7 g/dL (calc) (ref 1.9–3.7)
Glucose, Bld: 114 mg/dL — ABNORMAL HIGH (ref 65–99)
Potassium: 3.4 mmol/L — ABNORMAL LOW (ref 3.5–5.3)
Sodium: 139 mmol/L (ref 135–146)
Total Bilirubin: 0.8 mg/dL (ref 0.2–1.2)
Total Protein: 6.9 g/dL (ref 6.1–8.1)
eGFR: 29 mL/min/{1.73_m2} — ABNORMAL LOW (ref >=60)

## 2022-09-09 LAB — COMPREHENSIVE METABOLIC PANEL
ALT: 14 U/L (ref 0–44)
AST: 18 U/L (ref 15–41)
Albumin: 3.5 g/dL (ref 3.5–5.0)
Alkaline Phosphatase: 62 U/L (ref 38–126)
Anion gap: 14 (ref 5–15)
BUN: 38 mg/dL — ABNORMAL HIGH (ref 8–23)
CO2: 27 mmol/L (ref 22–32)
Calcium: 9.2 mg/dL (ref 8.9–10.3)
Chloride: 96 mmol/L — ABNORMAL LOW (ref 98–111)
Creatinine, Ser: 1.57 mg/dL — ABNORMAL HIGH (ref 0.44–1.00)
GFR, Estimated: 34 mL/min — ABNORMAL LOW (ref >60)
Glucose, Bld: 169 mg/dL — ABNORMAL HIGH (ref 70–99)
Potassium: 2.9 mmol/L — ABNORMAL LOW (ref 3.5–5.1)
Sodium: 137 mmol/L (ref 135–145)
Total Bilirubin: 0.9 mg/dL (ref 0.3–1.2)
Total Protein: 7.7 g/dL (ref 6.5–8.1)

## 2022-09-09 LAB — URINALYSIS, ROUTINE W REFLEX MICROSCOPIC
Bilirubin Urine: NEGATIVE
Glucose, UA: NEGATIVE
Hgb urine dipstick: NEGATIVE
Ketones, ur: NEGATIVE
Nitrite: POSITIVE — AB
Protein, ur: NEGATIVE
RBC / HPF: NONE SEEN /HPF (ref 0–2)
Specific Gravity, Urine: 1.01 (ref 1.001–1.035)
pH: 5.5 (ref 5.0–8.0)

## 2022-09-09 LAB — MICROSCOPIC MESSAGE

## 2022-09-09 LAB — HEMOGLOBIN A1C
Hgb A1c MFr Bld: 5.2 % of total Hgb (ref <5.7)
Mean Plasma Glucose: 103 mg/dL
eAG (mmol/L): 5.7 mmol/L

## 2022-09-09 LAB — TEST AUTHORIZATION: TEST CODE:: 395

## 2022-09-09 MED ORDER — SODIUM CHLORIDE 0.9 % IV BOLUS
500.0000 mL | Freq: Once | INTRAVENOUS | Status: AC
  Administered 2022-09-09: 500 mL via INTRAVENOUS

## 2022-09-09 MED ORDER — SODIUM CHLORIDE 0.9 % IV SOLN
2.0000 g | Freq: Once | INTRAVENOUS | Status: AC
  Administered 2022-09-09: 2 g via INTRAVENOUS
  Filled 2022-09-09: qty 20

## 2022-09-09 MED ORDER — CEPHALEXIN 500 MG PO CAPS
500.0000 mg | ORAL_CAPSULE | Freq: Three times a day (TID) | ORAL | 0 refills | Status: AC
  Filled 2022-09-09: qty 15, 5d supply, fill #0

## 2022-09-09 NOTE — ED Provider Notes (Signed)
[facility] EMERGENCY DEPT Provider Note   CSN: [id number] Arrival date & time: 09/09/22  1014     History  Chief Complaint  Patient presents with   Urinary Tract Infection    Jocelyn Camacho is a 76 y.o. female.  Patient here with UTI and elevated white count from primary care doctor.  Recent abdominal surgery.  She denies any abdominal pain, nausea, vomiting.  Overall she is asymptomatic.  She is been eating and drinking well.  She denies any fevers or chills.  Had basic blood work and urinalysis done yesterday at PCP follow-up after her hospital admission.  Denies any chest pain, weakness, nausea, vomiting.  The history is provided by the patient.       Home Medications Prior to Admission medications   Medication Sig Start Date End Date Taking? Authorizing Provider  cephALEXin (KEFLEX) 500 MG capsule Take 1 capsule (500 mg total) by mouth 3 (three) times daily for 5 days. 09/09/22 09/14/22 Yes Kamylah Manzo, DO  acetaminophen (TYLENOL) 650 MG CR tablet Take 1,300 mg by mouth every 8 (eight) hours as needed for pain.    [provider]  allopurinol (ZYLOPRIM) 300 MG tablet TAKE 1/2 TABLET(150 MG) BY MOUTH DAILY FOR GOUT PREVENTION Patient taking differently: Take 150 mg by mouth daily. Gout prevention 01/22/22   Alycia Rossetti, NP  Ascorbic Acid (VITAMIN C) 1000 MG tablet Take 1,000 mg by mouth daily.    [provider]  aspirin EC 81 MG tablet Take 81 mg by mouth daily.    [provider]  atenolol (TENORMIN) 100 MG tablet Take 1 tablet by mouth once daily for blood pressure Patient taking differently: Take 100 mg by mouth daily. blood pressure 02/10/22   Alycia Rossetti, NP  Cholecalciferol (VITAMIN D3) 5000 units CAPS Take 5,000 Units by mouth daily.    [provider]  colchicine 0.6 MG tablet Take  1 tablet  Daily  to Prevent Gout                                                                 /                         TAKE                      BY                     MOUTH Patient taking differently: Take 0.6 mg by mouth daily. Prevent Gout 05/18/22   [name], MD  Cyanocobalamin (B-12) 1000 MCG SUBL Place 1,000 mcg under the tongue 2 (two) times a week. Saturday and Sunday    [provider]  dorzolamide-timolol (COSOPT) 22.3-6.8 MG/ML ophthalmic solution Place 1 drop into the left eye 2 (two) times daily. 05/03/21   [provider]  Dulaglutide (TRULICITY) 3 YT/0.1SW SOPN Inject  3 MG(0.5 ML) into the skin  weekly for Diabetes (Dx: e10.29) Patient taking differently: Inject 3 mg as directed every Sunday. Diabetes 02/11/22   [name], MD  Finerenone (KERENDIA) 10 MG TABS Take 10 mg by mouth daily. Take  1 tablet  Daily  for Diabetic Kidneys Patient taking differently:  Take 10 mg by mouth daily. Diabetic Kidneys 04/11/21   [name], MD  furosemide (LASIX) 80 MG tablet TAKE 1 TABLET BY MOUTH 1 TO 2 TIMES PER DAY AS NEEDED FOR FLUID RETENTION OR ANKLE SWELLING 08/01/22   Lavina Hamman, MD  hyoscyamine (LEVSIN, ANASPAZ) 0.125 MG tablet Take 1 tablet (0.125 mg total) by mouth every 6 (six) hours as needed. Patient taking differently: Take 0.125 mg by mouth every 6 (six) hours as needed for bladder spasms. 10/05/17   [name], MD  insulin NPH-regular Human (70-30) 100 UNIT/ML injection Patient injects 25 units in the AM and 10 units in the PM. Reduce each insulin dose by 2 units if glucose persistently <100 Patient taking differently: Inject 5-20 Units into the skin See admin instructions. Patient injects 20 units in the AM and 5 units in the PM. Reduce each insulin dose by 2 units if glucose persistently <100 10/24/20   [name], NP  Lancets [facility] ULTRASOFT) lancets Check blood sugar 3 times daily-DX-E10.9 08/06/17   [name], MD  levothyroxine (SYNTHROID) 125 MCG tablet Take  1 tablet  Daily  on an empty stomach with only water for 30 minutes & no Antacid meds,  Calcium or Magnesium for 4 hours & avoid Biotin Patient taking differently: Take 125 mcg by mouth daily before breakfast. Take on an empty stomach with only water for 30 minutes & no Antacid meds, Calcium or Magnesium for 4 hours & avoid Biotin 12/29/21   [name], MD  nystatin (MYCOSTATIN/NYSTOP) powder Apply 1 Application topically 3 (three) times daily. 09/08/22   Darrol Jump, NP  nystatin cream (MYCOSTATIN) Apply 1 Application topically 2 (two) times daily. 09/08/22   Darrol Jump, NP  ondansetron (ZOFRAN-ODT) 4 MG disintegrating tablet Take 1 tablet (4 mg total) by mouth every 6 (six) hours as needed for nausea. 08/01/22   Lavina Hamman, MD  [facility] ULTRA test strip CHECK BLOOD SUGAR 3 TIMES A DAY 05/18/22   [name], MD  polymyxin B 500,000 Units in sodium chloride irrigation 0.9 % 500 mL Irrigate with 1 Application as directed every 30 (thirty) days. Eye Injection    [provider]  rosuvastatin (CRESTOR) 5 MG tablet Take 1 tablet once weekly as directed for Cholesterol LDL goal less than 70. 08/12/21   [name], NP  traMADol (ULTRAM) 50 MG tablet Take 1 tablet (50 mg total) by mouth every 6 (six) hours as needed for moderate pain. 08/01/22   Saverio Danker, PA-C  zinc gluconate 50 MG tablet Take 50 mg by mouth daily.    [provider]      Allergies    Penicillins and Sulfa antibiotics    Review of Systems   Review of Systems  Physical Exam Updated Vital Signs BP 105/71   Pulse 83   Temp 98.3 F (36.8 C)   Resp 18   Ht 5\' 2"  (1.575 m)   Wt 99.8 kg   SpO2 97%   BMI 40.24 kg/m  Physical Exam Vitals and nursing note reviewed.  Constitutional:      General: She is not in acute distress.    Appearance: She is well-developed. She is not ill-appearing.  HENT:     Head: Normocephalic and atraumatic.     Nose: Nose normal.     Mouth/Throat:     Mouth: Mucous membranes are moist.  Eyes:     Extraocular Movements: Extraocular  movements intact.     Conjunctiva/sclera: Conjunctivae normal.  Pupils: Pupils are equal, round, and reactive to light.  Cardiovascular:     Rate and Rhythm: Normal rate and regular rhythm.     Pulses: Normal pulses.     Heart sounds: Normal heart sounds. No murmur heard. Pulmonary:     Effort: Pulmonary effort is normal. No respiratory distress.     Breath sounds: Normal breath sounds.  Abdominal:     Palpations: Abdomen is soft.     Tenderness: There is no abdominal tenderness.  Musculoskeletal:        General: No swelling.     Cervical back: Normal range of motion and neck supple.  Skin:    General: Skin is warm and dry.     Capillary Refill: Capillary refill takes less than 2 seconds.     Comments: Abdominal surgical site is clean dry and intact with no drainage, minor wound dehiscence  Neurological:     Mental Status: She is alert.  Psychiatric:        Mood and Affect: Mood normal.     ED Results / Procedures / Treatments   Labs (all labs ordered are listed, but only abnormal results are displayed) Labs Reviewed  COMPREHENSIVE METABOLIC PANEL - Abnormal; Notable for the following components:      Result Value   Potassium 2.9 (*)    Chloride 96 (*)    Glucose, Bld 169 (*)    BUN 38 (*)    Creatinine, Ser 1.57 (*)    GFR, Estimated 34 (*)    All other components within normal limits  CBC WITH DIFFERENTIAL/PLATELET - Abnormal; Notable for the following components:   WBC 15.8 (*)    RBC 3.72 (*)    Hemoglobin 10.7 (*)    HCT 32.9 (*)    RDW 15.9 (*)    Platelets 486 (*)    Neutro Abs 11.4 (*)    Monocytes Absolute 1.1 (*)    Basophils Absolute 0.2 (*)    Abs Immature Granulocytes 0.60 (*)    All other components within normal limits  URINE CULTURE    EKG None  Radiology No results found.  Procedures Procedures    Medications Ordered in ED Medications  cefTRIAXone (ROCEPHIN) 2 g in sodium chloride 0.9 % 100 mL IVPB (0 g Intravenous Stopped 09/09/22  1255)  sodium chloride 0.9 % bolus 500 mL (500 mLs Intravenous New Bag/Given 09/09/22 1210)    ED Course/ Medical Decision Making/ A&P                           Medical Decision Making Amount and/or Complexity of Data Reviewed Labs: ordered.  Risk Prescription drug management.   YNEZ EUGENIO is here after having elevated blood count and UTI diagnosed by primary care doctor yesterday.  No significant comorbidities.  Normal vitals.  No fever.  She is fairly asymptomatic.  She had recent abdominal surgery and she was seen by primary care doctor for hospital follow-up.  She has some minor wound dehiscence at her surgical site her abdomen but there is no purulent drainage or redness.  She has no abdominal pain.  Despite elevated white count she has no fever.  Have no concern for sepsis.  Her vital signs are normal.  She looks well.  We will give a dose IV antibiotics and IV fluid bolus and check basic labs again.  We will send urine culture.  We will discharge likely with antibiotics for home.  I  reviewed urinalysis from yesterday and is consistent with UTI.  Other blood work is unremarkable.  Patient tolerated IV antibiotic well.  White count down to 15.8.  Lab work otherwise unremarkable.  Discharged in good condition.  Will take next dose antibiotic tomorrow.  I have no concern for sepsis at this time.  She understands return precautions.  This chart was dictated using voice recognition software.  Despite best efforts to proofread,  errors can occur which can change the documentation meaning.         Final Clinical Impression(s) / ED Diagnoses Final diagnoses:  Acute cystitis without hematuria    Rx / DC Orders ED Discharge Orders          Ordered    cephALEXin (KEFLEX) 500 MG capsule  3 times daily        09/09/22 1330              Trevious Rampey, [place], DO 09/09/22 1331

## 2022-09-09 NOTE — Discharge Instructions (Signed)
Take next dose antibiotic tomorrow morning.  If you develop worsening symptoms lower abdominal pain or fever greater than 101 please return for evaluation.

## 2022-09-09 NOTE — ED Triage Notes (Signed)
Pt sent from PCP related to an elevated WBC count and an urinary tract infection. Pt  denis pain, or any complication with urination. Pt aler and oriented.

## 2022-09-11 LAB — URINE CULTURE: Culture: 30000 — AB

## 2022-09-12 ENCOUNTER — Telehealth (HOSPITAL_BASED_OUTPATIENT_CLINIC_OR_DEPARTMENT_OTHER): Payer: Self-pay | Admitting: Emergency Medicine

## 2022-09-12 NOTE — Telephone Encounter (Signed)
Post ED Visit - Positive Culture Follow-up  Culture report reviewed by antimicrobial stewardship pharmacist: [place] Team []  Elenor Quinones, Pharm.D. []  Heide Guile, Pharm.D., BCPS AQ-ID []  Parks Neptune, Pharm.D., BCPS []  Alycia Rossetti, Pharm.D., BCPS []  [place], Pharm.D., BCPS, AAHIVP []  Legrand Como, Pharm.D., BCPS, AAHIVP []  Salome Arnt, PharmD, BCPS []  Johnnette Gourd, PharmD, BCPS []  Hughes Better, PharmD, BCPS [x]  Luisa Hart, PharmD []  Laqueta Linden, PharmD, BCPS []  Albertina Parr, PharmD  [place] Team []  Leodis Sias, PharmD []  Lindell Spar, PharmD []  Royetta Asal, PharmD []  Graylin Shiver, Rph []  Rema Fendt) Glennon Mac, PharmD []  Arlyn Dunning, PharmD []  Netta Cedars, PharmD []  Dia Sitter, PharmD []  Leone Haven, PharmD []  Gretta Arab, PharmD []  Theodis Shove, PharmD []  Peggyann Juba, PharmD []  Reuel Boom, PharmD   Positive urine culture Treated with Cephalexin, organism sensitive to the same and no further patient follow-up is required at this time.  Milus Mallick 09/12/2022, 2:59 PM

## 2022-09-15 ENCOUNTER — Encounter (INDEPENDENT_AMBULATORY_CARE_PROVIDER_SITE_OTHER): Payer: Medicare Other | Admitting: Ophthalmology

## 2022-09-15 DIAGNOSIS — H43813 Vitreous degeneration, bilateral: Secondary | ICD-10-CM

## 2022-09-15 DIAGNOSIS — I10 Essential (primary) hypertension: Secondary | ICD-10-CM | POA: Diagnosis not present

## 2022-09-15 DIAGNOSIS — H35033 Hypertensive retinopathy, bilateral: Secondary | ICD-10-CM | POA: Diagnosis not present

## 2022-09-15 DIAGNOSIS — H34812 Central retinal vein occlusion, left eye, with macular edema: Secondary | ICD-10-CM

## 2022-09-18 ENCOUNTER — Encounter: Payer: Self-pay | Admitting: Nurse Practitioner

## 2022-09-18 ENCOUNTER — Ambulatory Visit (INDEPENDENT_AMBULATORY_CARE_PROVIDER_SITE_OTHER): Payer: Medicare Other | Admitting: Nurse Practitioner

## 2022-09-18 VITALS — BP 110/66 | HR 94 | Temp 97.7°F | Ht 62.0 in | Wt 224.0 lb

## 2022-09-18 DIAGNOSIS — Z789 Other specified health status: Secondary | ICD-10-CM | POA: Diagnosis not present

## 2022-09-18 DIAGNOSIS — D72828 Other elevated white blood cell count: Secondary | ICD-10-CM

## 2022-09-18 DIAGNOSIS — N3000 Acute cystitis without hematuria: Secondary | ICD-10-CM

## 2022-09-18 DIAGNOSIS — E1122 Type 2 diabetes mellitus with diabetic chronic kidney disease: Secondary | ICD-10-CM | POA: Diagnosis not present

## 2022-09-18 DIAGNOSIS — I10 Essential (primary) hypertension: Secondary | ICD-10-CM

## 2022-09-18 DIAGNOSIS — Z79899 Other long term (current) drug therapy: Secondary | ICD-10-CM | POA: Diagnosis not present

## 2022-09-18 DIAGNOSIS — N1832 Chronic kidney disease, stage 3b: Secondary | ICD-10-CM

## 2022-09-18 DIAGNOSIS — B372 Candidiasis of skin and nail: Secondary | ICD-10-CM

## 2022-09-18 DIAGNOSIS — D649 Anemia, unspecified: Secondary | ICD-10-CM

## 2022-09-18 DIAGNOSIS — Z794 Long term (current) use of insulin: Secondary | ICD-10-CM

## 2022-09-18 DIAGNOSIS — I878 Other specified disorders of veins: Secondary | ICD-10-CM

## 2022-09-18 DIAGNOSIS — Z1389 Encounter for screening for other disorder: Secondary | ICD-10-CM

## 2022-09-18 MED ORDER — NYSTATIN 100000 UNIT/GM EX POWD: 1.0000 | Freq: Three times a day (TID) | CUTANEOUS | 0 refills | Status: DC

## 2022-09-18 MED ORDER — NYSTATIN 100000 UNIT/GM EX CREA: 1.0000 | TOPICAL_CREAM | Freq: Two times a day (BID) | CUTANEOUS | 2 refills | Status: DC

## 2022-09-18 NOTE — Progress Notes (Signed)
+1 MONTH FOLLOW UP  Assessment and Plan:  Type 2 diabetes mellitus with stage 3b chronic kidney disease, with long-term current use of insulin (HCC) In home sugars to remain well controlled between 120-150. Education: Reviewed 'ABCs' of diabetes management  Discussed goals to be met and/or maintained include A1C (<7) Blood pressure (<130/80) Cholesterol (LDL <70) Continue ACE, bASA, statin Discussed how what you eat and drink can aide in kidney protection. Stay well hydrated. Avoid high salt foods. Avoid NSAIDS. Keep BP and BG well controlled.   Take medications as prescribed. Remain active and exercise as tolerated daily. Maintain weight.  Continue to monitor. Check and monitor CMP/GFR   Medication management All medications discussed and reviewed in full. All questions and concerns regarding medications addressed.     Venous stasis ulcer Resolved  Presence of surgical incision (abdomen) UTA, covered with bandage.   Healed Resolved  Low Hemoglobin Monitor CBC   Hypertension Controlled Discussed DASH (Dietary Approaches to Stop Hypertension) DASH diet is lower in sodium than a typical American diet. Cut back on foods that are high in saturated fat, cholesterol, and trans fats. Eat more whole-grain foods, fish, poultry, and nuts Remain active and exercise as tolerated daily.  Monitor BP at home-Call if greater than 130/80.  Check and monitor CMP/CBC  Screening for hematuria or proteinuria Check and monitor UA  Skin Candidiasis Continue topical Nystatin Continue InterDry pads - can purchase from [employer]. Keep area dry and free of moisture. Continue to monitor  UTI Check UA Stay well hydrated  Elevated WBC Monitor CBC  Orders Placed This Encounter  Procedures   Urine Culture   CBC with Differential/Platelet   COMPLETE METABOLIC PANEL WITH GFR   Urinalysis, Routine w reflex microscopic   Meds ordered this encounter  Medications   nystatin  (MYCOSTATIN/NYSTOP) powder    Sig: Apply 1 Application topically 3 (three) times daily.    Dispense:  15 g    Refill:  0    Order Specific Question:   Supervising Provider    Answer:   [name] [[id number]]   nystatin cream (MYCOSTATIN)    Sig: Apply 1 Application topically 2 (two) times daily.    Dispense:  30 g    Refill:  2    Order Specific Question:   Supervising Provider    Answer:   [name] [phone]    All medications were reviewed with patient and family and fully reconciled. All questions answered fully, and patient and family members were encouraged to call the office with any further questions or concerns. Discussed goal to avoid readmission related to this   Over 20 minutes of exam, counseling, chart review, and complex, high/moderate level critical decision making was performed this visit.   Future Appointments  Date Time Provider [place]  09/22/2022 10:00 AM Darrol Jump, NP [facility] None  10/20/2022  8:15 AM [name], MD [facility] None  10/30/2022  9:30 AM Debbora Presto, NP [facility] None  03/05/2023 10:00 AM Alycia Rossetti, NP [facility] None     HPI 76 y.o.female presents alongside husband for a one week follow up month follow up.  During last OV she was noted to have acute cystitis with elevated WBC at ~18.  She was sent to ER for IV abx tm, Ceftriaxone and IV fluids.  WBC resolved to 15.58.  She was discharged with Keflex and has completed the course. She was also noted to have a potassium of 2.9.  She is trying to stay  well hydrated.  She is continuing to take in food.   She is continuing to treat left breast skin candidiasis.  She has not treated with anything at this time.    Abdominal wound has completely  healed.  She is unsure if [facility] is continuing to follow.   BMI is Body mass index is 40.97 kg/m., she  bhas noteen working on diet and exercise. Wt Readings from Last 3 Encounters:  09/18/22 224 lb (101.6 kg)  09/09/22 220 lb  (99.8 kg)  09/08/22 220 lb (99.8 kg)    Current Outpatient Medications (Endocrine & Metabolic):    Dulaglutide (TRULICITY) 3 BS/9.6GE SOPN, Inject  3 MG(0.5 ML) into the skin  weekly for Diabetes (Dx: e10.29) (Patient taking differently: Inject 3 mg as directed every Sunday. Diabetes)   Finerenone (KERENDIA) 10 MG TABS, Take 10 mg by mouth daily. Take  1 tablet  Daily  for Diabetic Kidneys (Patient taking differently: Take 10 mg by mouth daily. Diabetic Kidneys)   insulin NPH-regular Human (70-30) 100 UNIT/ML injection, Patient injects 25 units in the AM and 10 units in the PM. Reduce each insulin dose by 2 units if glucose persistently <100 (Patient taking differently: Inject 5-20 Units into the skin See admin instructions. Patient injects 20 units in the AM and 5 units in the PM. Reduce each insulin dose by 2 units if glucose persistently <100)   levothyroxine (SYNTHROID) 125 MCG tablet, Take  1 tablet  Daily  on an empty stomach with only water for 30 minutes & no Antacid meds, Calcium or Magnesium for 4 hours & avoid Biotin (Patient taking differently: Take 125 mcg by mouth daily before breakfast. Take on an empty stomach with only water for 30 minutes & no Antacid meds, Calcium or Magnesium for 4 hours & avoid Biotin)  Current Outpatient Medications (Cardiovascular):    atenolol (TENORMIN) 100 MG tablet, Take 1 tablet by mouth once daily for blood pressure (Patient taking differently: Take 100 mg by mouth daily. blood pressure)   furosemide (LASIX) 80 MG tablet, TAKE 1 TABLET BY MOUTH 1 TO 2 TIMES PER DAY AS NEEDED FOR FLUID RETENTION OR ANKLE SWELLING   rosuvastatin (CRESTOR) 5 MG tablet, Take 1 tablet once weekly as directed for Cholesterol LDL goal less than 70.   Current Outpatient Medications (Analgesics):    acetaminophen (TYLENOL) 650 MG CR tablet, Take 1,300 mg by mouth every 8 (eight) hours as needed for pain.   allopurinol (ZYLOPRIM) 300 MG tablet, TAKE 1/2 TABLET(150 MG) BY MOUTH  DAILY FOR GOUT PREVENTION (Patient taking differently: Take 150 mg by mouth daily. Gout prevention)   aspirin EC 81 MG tablet, Take 81 mg by mouth daily.   colchicine 0.6 MG tablet, Take  1 tablet  Daily  to Prevent Gout                                                                 /                         TAKE                     BY  MOUTH (Patient taking differently: Take 0.6 mg by mouth daily. Prevent Gout)   traMADol (ULTRAM) 50 MG tablet, Take 1 tablet (50 mg total) by mouth every 6 (six) hours as needed for moderate pain.  Current Outpatient Medications (Hematological):    Cyanocobalamin (B-12) 1000 MCG SUBL, Place 1,000 mcg under the tongue 2 (two) times a week. Saturday and Sunday  Current Outpatient Medications (Other):    Ascorbic Acid (VITAMIN C) 1000 MG tablet, Take 1,000 mg by mouth daily.   Cholecalciferol (VITAMIN D3) 5000 units CAPS, Take 5,000 Units by mouth daily.   dorzolamide-timolol (COSOPT) 22.3-6.8 MG/ML ophthalmic solution, Place 1 drop into the left eye 2 (two) times daily.   hyoscyamine (LEVSIN, ANASPAZ) 0.125 MG tablet, Take 1 tablet (0.125 mg total) by mouth every 6 (six) hours as needed. (Patient taking differently: Take 0.125 mg by mouth every 6 (six) hours as needed for bladder spasms.)   Lancets (ONETOUCH ULTRASOFT) lancets, Check blood sugar 3 times daily-DX-E10.9   nystatin (MYCOSTATIN/NYSTOP) powder, Apply 1 Application topically 3 (three) times daily.   nystatin cream (MYCOSTATIN), Apply 1 Application topically 2 (two) times daily.   ondansetron (ZOFRAN-ODT) 4 MG disintegrating tablet, Take 1 tablet (4 mg total) by mouth every 6 (six) hours as needed for nausea.   ONETOUCH ULTRA test strip, CHECK BLOOD SUGAR 3 TIMES A DAY   polymyxin B 500,000 Units in sodium chloride irrigation 0.9 % 500 mL, Irrigate with 1 Application as directed every 30 (thirty) days. Eye Injection   zinc gluconate 50 MG tablet, Take 50 mg by mouth daily.  Past  Medical History:  Diagnosis Date   Allergic rhinitis    Allergy    Anxiety    with medical procedures   Hyperlipidemia    Hypertension    Obesity    OSA (obstructive sleep apnea)    wears CPAP   Type II or unspecified type diabetes mellitus with renal manifestations, not stated as uncontrolled(250.40)    Unspecified hypothyroidism    Vitamin D deficiency      Allergies  Allergen Reactions   Penicillins Swelling   Sulfa Antibiotics Itching and Rash    ROS: all negative except above.   Physical Exam: Filed Weights   09/18/22 0958  Weight: 224 lb (101.6 kg)    BP 110/66   Pulse 94   Temp 97.7 F (36.5 C)   Ht $R'5\' 2"'tS$  (1.575 m)   Wt 224 lb (101.6 kg)   SpO2 96%   BMI 40.97 kg/m  General Appearance: Well nourished, in no apparent distress. Eyes: PERRLA, EOMs, conjunctiva no swelling or erythema Sinuses: No Frontal/maxillary tenderness ENT/Mouth: Ext aud canals clear, TMs without erythema, bulging. No erythema, swelling, or exudate on post pharynx.  Tonsils not swollen or erythematous. Hearing normal.  Neck: Supple, thyroid normal.  Respiratory: Respiratory effort normal, BS equal bilaterally without rales, rhonchi, wheezing or stridor.  Cardio: RRR with no MRGs. Brisk peripheral pulses without edema.  Abdomen: Soft, + BS.  Non tender, no guarding, rebound, hernias, masses. Lymphatics: Non tender without lymphadenopathy.  Musculoskeletal: Full ROM, 5/5 strength, normal gait.  Skin: Left breast with mild erythema. Neuro: Cranial nerves intact. Normal muscle tone, no cerebellar symptoms. Sensation intact.  Psych: Awake and oriented X 3, normal affect, Insight and Judgment appropriate.     Darrol Jump, NP 10:30 AM [facility]

## 2022-09-19 LAB — COMPLETE METABOLIC PANEL WITH GFR
AG Ratio: 1 (calc) (ref 1.0–2.5)
ALT: 8 U/L (ref 6–29)
AST: 17 U/L (ref 10–35)
Albumin: 3.5 g/dL — ABNORMAL LOW (ref 3.6–5.1)
Alkaline phosphatase (APISO): 56 U/L (ref 37–153)
BUN/Creatinine Ratio: 12 (calc) (ref 6–22)
BUN: 17 mg/dL (ref 7–25)
CO2: 31 mmol/L (ref 20–32)
Calcium: 9.2 mg/dL (ref 8.6–10.4)
Chloride: 103 mmol/L (ref 98–110)
Creat: 1.37 mg/dL — ABNORMAL HIGH (ref 0.60–1.00)
Globulin: 3.5 g/dL (calc) (ref 1.9–3.7)
Glucose, Bld: 84 mg/dL (ref 65–99)
Potassium: 4.2 mmol/L (ref 3.5–5.3)
Sodium: 143 mmol/L (ref 135–146)
Total Bilirubin: 0.6 mg/dL (ref 0.2–1.2)
Total Protein: 7 g/dL (ref 6.1–8.1)
eGFR: 40 mL/min/{1.73_m2} — ABNORMAL LOW (ref >=60)

## 2022-09-19 LAB — CBC WITH DIFFERENTIAL/PLATELET
Absolute Monocytes: 595 cells/uL (ref 200–950)
Basophils Absolute: 67 cells/uL (ref 0–200)
Basophils Relative: 0.7 %
Eosinophils Absolute: 595 cells/uL — ABNORMAL HIGH (ref 15–500)
Eosinophils Relative: 6.2 %
HCT: 30.1 % — ABNORMAL LOW (ref 35.0–45.0)
Hemoglobin: 9.8 g/dL — ABNORMAL LOW (ref 11.7–15.5)
Lymphs Abs: 2870 cells/uL (ref 850–3900)
MCH: 29 pg (ref 27.0–33.0)
MCHC: 32.6 g/dL (ref 32.0–36.0)
MCV: 89.1 fL (ref 80.0–100.0)
MPV: 12.7 fL — ABNORMAL HIGH (ref 7.5–12.5)
Monocytes Relative: 6.2 %
Neutro Abs: 5472 cells/uL (ref 1500–7800)
Neutrophils Relative %: 57 %
Platelets: 408 10*3/uL — ABNORMAL HIGH (ref 140–400)
RBC: 3.38 10*6/uL — ABNORMAL LOW (ref 3.80–5.10)
RDW: 17 % — ABNORMAL HIGH (ref 11.0–15.0)
Total Lymphocyte: 29.9 %
WBC: 9.6 10*3/uL (ref 3.8–10.8)

## 2022-09-22 ENCOUNTER — Other Ambulatory Visit: Payer: Self-pay | Admitting: Nurse Practitioner

## 2022-09-22 ENCOUNTER — Ambulatory Visit: Payer: Medicare Other | Admitting: Nurse Practitioner

## 2022-09-22 DIAGNOSIS — N3000 Acute cystitis without hematuria: Secondary | ICD-10-CM

## 2022-09-22 LAB — URINALYSIS, ROUTINE W REFLEX MICROSCOPIC
Bilirubin Urine: NEGATIVE
Glucose, UA: NEGATIVE
Hgb urine dipstick: NEGATIVE
Ketones, ur: NEGATIVE
Nitrite: POSITIVE — AB
Protein, ur: NEGATIVE
RBC / HPF: NONE SEEN /HPF (ref 0–2)
Specific Gravity, Urine: 1.01 (ref 1.001–1.035)
pH: 6 (ref 5.0–8.0)

## 2022-09-22 LAB — MICROSCOPIC MESSAGE

## 2022-09-22 LAB — URINE CULTURE
MICRO NUMBER:: 13987801
SPECIMEN QUALITY:: ADEQUATE

## 2022-09-22 MED ORDER — CIPROFLOXACIN HCL 250 MG PO TABS: 250.0000 mg | ORAL_TABLET | Freq: Two times a day (BID) | ORAL | 0 refills | Status: AC

## 2022-09-22 NOTE — Progress Notes (Deleted)
+1 MONTH FOLLOW UP  Assessment and Plan:  Type 2 diabetes mellitus with stage 3b chronic kidney disease, with long-term current use of insulin (HCC) In home sugars to remain well controlled between 120-150. Education: Reviewed 'ABCs' of diabetes management  Discussed goals to be met and/or maintained include A1C (<7) Blood pressure (<130/80) Cholesterol (LDL <70) Continue ACE, bASA, statin Discussed how what you eat and drink can aide in kidney protection. Stay well hydrated. Avoid high salt foods. Avoid NSAIDS. Keep BP and BG well controlled.   Take medications as prescribed. Remain active and exercise as tolerated daily. Maintain weight.  Continue to monitor. Check and monitor CMP/GFR   Medication management All medications discussed and reviewed in full. All questions and concerns regarding medications addressed.     Venous stasis ulcer Resolved  Presence of surgical incision (abdomen) UTA, covered with bandage.   Healed Resolved  Low Hemoglobin Monitor CBC   Hypertension Controlled Discussed DASH (Dietary Approaches to Stop Hypertension) DASH diet is lower in sodium than a typical American diet. Cut back on foods that are high in saturated fat, cholesterol, and trans fats. Eat more whole-grain foods, fish, poultry, and nuts Remain active and exercise as tolerated daily.  Monitor BP at home-Call if greater than 130/80.  Check and monitor CMP/CBC  Screening for hematuria or proteinuria Check and monitor UA  Skin Candidiasis Continue topical Nystatin Continue InterDry pads - can purchase from [employer]. Keep area dry and free of moisture. Continue to monitor  UTI Check UA Stay well hydrated  Elevated WBC Monitor CBC  No orders of the defined types were placed in this encounter.  No orders of the defined types were placed in this encounter.   All medications were reviewed with patient and family and fully reconciled. All questions answered fully,  and patient and family members were encouraged to call the office with any further questions or concerns. Discussed goal to avoid readmission related to this   Over 20 minutes of exam, counseling, chart review, and complex, high/moderate level critical decision making was performed this visit.   Future Appointments  Date Time Provider [place]  09/22/2022 10:00 AM Darrol Jump, NP [facility] None  10/20/2022  8:15 AM [name], MD [facility] None  10/30/2022  9:30 AM Debbora Presto, NP [facility] None  03/05/2023 10:00 AM Alycia Rossetti, NP [facility] None     HPI 76 y.o.female presents alongside husband for a one week follow up month follow up.  During last OV she was noted to have acute cystitis with elevated WBC at ~18.  She was sent to ER for IV abx tm, Ceftriaxone and IV fluids.  WBC resolved to 15.58.  She was discharged with Keflex and has completed the course. She was also noted to have a potassium of 2.9.  She is trying to stay well hydrated.  She is continuing to take in food.   She is continuing to treat left breast skin candidiasis.  She has not treated with anything at this time.    Abdominal wound has completely  healed.  She is unsure if [facility] is continuing to follow.   BMI is There is no height or weight on file to calculate BMI., she  bhas noteen working on diet and exercise. Wt Readings from Last 3 Encounters:  09/18/22 224 lb (101.6 kg)  09/09/22 220 lb (99.8 kg)  09/08/22 220 lb (99.8 kg)    Current Outpatient Medications (Endocrine & Metabolic):    Dulaglutide (TRULICITY) 3  MG/0.5ML SOPN, Inject  3 MG(0.5 ML) into the skin  weekly for Diabetes (Dx: e10.29) (Patient taking differently: Inject 3 mg as directed every Sunday. Diabetes)   Finerenone (KERENDIA) 10 MG TABS, Take 10 mg by mouth daily. Take  1 tablet  Daily  for Diabetic Kidneys (Patient taking differently: Take 10 mg by mouth daily. Diabetic Kidneys)   insulin NPH-regular Human (70-30) 100 UNIT/ML  injection, Patient injects 25 units in the AM and 10 units in the PM. Reduce each insulin dose by 2 units if glucose persistently <100 (Patient taking differently: Inject 5-20 Units into the skin See admin instructions. Patient injects 20 units in the AM and 5 units in the PM. Reduce each insulin dose by 2 units if glucose persistently <100)   levothyroxine (SYNTHROID) 125 MCG tablet, Take  1 tablet  Daily  on an empty stomach with only water for 30 minutes & no Antacid meds, Calcium or Magnesium for 4 hours & avoid Biotin (Patient taking differently: Take 125 mcg by mouth daily before breakfast. Take on an empty stomach with only water for 30 minutes & no Antacid meds, Calcium or Magnesium for 4 hours & avoid Biotin)  Current Outpatient Medications (Cardiovascular):    atenolol (TENORMIN) 100 MG tablet, Take 1 tablet by mouth once daily for blood pressure (Patient taking differently: Take 100 mg by mouth daily. blood pressure)   furosemide (LASIX) 80 MG tablet, TAKE 1 TABLET BY MOUTH 1 TO 2 TIMES PER DAY AS NEEDED FOR FLUID RETENTION OR ANKLE SWELLING   rosuvastatin (CRESTOR) 5 MG tablet, Take 1 tablet once weekly as directed for Cholesterol LDL goal less than 70.   Current Outpatient Medications (Analgesics):    acetaminophen (TYLENOL) 650 MG CR tablet, Take 1,300 mg by mouth every 8 (eight) hours as needed for pain.   allopurinol (ZYLOPRIM) 300 MG tablet, TAKE 1/2 TABLET(150 MG) BY MOUTH DAILY FOR GOUT PREVENTION (Patient taking differently: Take 150 mg by mouth daily. Gout prevention)   aspirin EC 81 MG tablet, Take 81 mg by mouth daily.   colchicine 0.6 MG tablet, Take  1 tablet  Daily  to Prevent Gout                                                                 /                         TAKE                     BY                     MOUTH (Patient taking differently: Take 0.6 mg by mouth daily. Prevent Gout)   traMADol (ULTRAM) 50 MG tablet, Take 1 tablet (50 mg total) by mouth every 6 (six)  hours as needed for moderate pain.  Current Outpatient Medications (Hematological):    Cyanocobalamin (B-12) 1000 MCG SUBL, Place 1,000 mcg under the tongue 2 (two) times a week. Saturday and Sunday  Current Outpatient Medications (Other):    Ascorbic Acid (VITAMIN C) 1000 MG tablet, Take 1,000 mg by mouth daily.   Cholecalciferol (VITAMIN D3) 5000 units CAPS, Take 5,000 Units by mouth daily.  dorzolamide-timolol (COSOPT) 22.3-6.8 MG/ML ophthalmic solution, Place 1 drop into the left eye 2 (two) times daily.   hyoscyamine (LEVSIN, ANASPAZ) 0.125 MG tablet, Take 1 tablet (0.125 mg total) by mouth every 6 (six) hours as needed. (Patient taking differently: Take 0.125 mg by mouth every 6 (six) hours as needed for bladder spasms.)   Lancets (ONETOUCH ULTRASOFT) lancets, Check blood sugar 3 times daily-DX-E10.9   nystatin (MYCOSTATIN/NYSTOP) powder, Apply 1 Application topically 3 (three) times daily.   nystatin cream (MYCOSTATIN), Apply 1 Application topically 2 (two) times daily.   ondansetron (ZOFRAN-ODT) 4 MG disintegrating tablet, Take 1 tablet (4 mg total) by mouth every 6 (six) hours as needed for nausea.   ONETOUCH ULTRA test strip, CHECK BLOOD SUGAR 3 TIMES A DAY   polymyxin B 500,000 Units in sodium chloride irrigation 0.9 % 500 mL, Irrigate with 1 Application as directed every 30 (thirty) days. Eye Injection   zinc gluconate 50 MG tablet, Take 50 mg by mouth daily.  Past Medical History:  Diagnosis Date   Allergic rhinitis    Allergy    Anxiety    with medical procedures   Hyperlipidemia    Hypertension    Obesity    OSA (obstructive sleep apnea)    wears CPAP   Type II or unspecified type diabetes mellitus with renal manifestations, not stated as uncontrolled(250.40)    Unspecified hypothyroidism    Vitamin D deficiency      Allergies  Allergen Reactions   Penicillins Swelling   Sulfa Antibiotics Itching and Rash    ROS: all negative except above.   Physical  Exam: There were no vitals filed for this visit.   There were no vitals taken for this visit. General Appearance: Well nourished, in no apparent distress. Eyes: PERRLA, EOMs, conjunctiva no swelling or erythema Sinuses: No Frontal/maxillary tenderness ENT/Mouth: Ext aud canals clear, TMs without erythema, bulging. No erythema, swelling, or exudate on post pharynx.  Tonsils not swollen or erythematous. Hearing normal.  Neck: Supple, thyroid normal.  Respiratory: Respiratory effort normal, BS equal bilaterally without rales, rhonchi, wheezing or stridor.  Cardio: RRR with no MRGs. Brisk peripheral pulses without edema.  Abdomen: Soft, + BS.  Non tender, no guarding, rebound, hernias, masses. Lymphatics: Non tender without lymphadenopathy.  Musculoskeletal: Full ROM, 5/5 strength, normal gait.  Skin: Left breast with mild erythema. Neuro: Cranial nerves intact. Normal muscle tone, no cerebellar symptoms. Sensation intact.  Psych: Awake and oriented X 3, normal affect, Insight and Judgment appropriate.     Darrol Jump, NP 8:55 AM [facility]

## 2022-09-23 ENCOUNTER — Other Ambulatory Visit: Payer: Self-pay | Admitting: Nurse Practitioner

## 2022-09-25 ENCOUNTER — Encounter: Payer: Self-pay | Admitting: Nurse Practitioner

## 2022-09-25 ENCOUNTER — Ambulatory Visit (INDEPENDENT_AMBULATORY_CARE_PROVIDER_SITE_OTHER): Payer: Medicare Other | Admitting: Nurse Practitioner

## 2022-09-25 VITALS — BP 120/68 | HR 90 | Temp 96.8°F | Ht 62.0 in | Wt 222.4 lb

## 2022-09-25 DIAGNOSIS — I10 Essential (primary) hypertension: Secondary | ICD-10-CM | POA: Diagnosis not present

## 2022-09-25 DIAGNOSIS — D649 Anemia, unspecified: Secondary | ICD-10-CM | POA: Diagnosis not present

## 2022-09-25 DIAGNOSIS — E1122 Type 2 diabetes mellitus with diabetic chronic kidney disease: Secondary | ICD-10-CM

## 2022-09-25 DIAGNOSIS — Z79899 Other long term (current) drug therapy: Secondary | ICD-10-CM

## 2022-09-25 DIAGNOSIS — N1832 Chronic kidney disease, stage 3b: Secondary | ICD-10-CM

## 2022-09-25 DIAGNOSIS — N3000 Acute cystitis without hematuria: Secondary | ICD-10-CM

## 2022-09-25 DIAGNOSIS — Z794 Long term (current) use of insulin: Secondary | ICD-10-CM

## 2022-09-25 DIAGNOSIS — B372 Candidiasis of skin and nail: Secondary | ICD-10-CM

## 2022-09-25 NOTE — Progress Notes (Signed)
FOLLOW UP  Assessment and Plan:  Type 2 diabetes mellitus with stage 3b chronic kidney disease, with long-term current use of insulin [facility]) Education: Reviewed 'ABCs' of diabetes management  Discussed goals to be met and/or maintained include A1C (<7) Blood pressure (<130/80) Cholesterol (LDL <70) Continue ACE, bASA, statin Discussed how what you eat and drink can aide in kidney protection. Stay well hydrated. Avoid high salt foods. Avoid NSAIDS. Keep BP and BG well controlled.   Take medications as prescribed. Remain active and exercise as tolerated daily. Maintain weight.  Continue to monitor. Check and monitor CMP/GFR   Medication management All medications discussed and reviewed in full. All questions and concerns regarding medications addressed.    Low Hemoglobin Monitor CBC   Hypertension Controlled Discussed DASH (Dietary Approaches to Stop Hypertension) DASH diet is lower in sodium than a typical American diet. Cut back on foods that are high in saturated fat, cholesterol, and trans fats. Eat more whole-grain foods, fish, poultry, and nuts Remain active and exercise as tolerated daily.  Monitor BP at home-Call if greater than 130/80.  Check and monitor CMP/CBC  Acute cystitis Check and monitor UA  Skin Candidiasis Improving and almost well healed Continue topical Nystatin Continue InterDry pads - can purchase from [employer]. Keep area dry and free of moisture. Continue to monitor  Elevated WBC Monitor CBC  Orders Placed This Encounter  Procedures   Urine Culture   CBC with Differential/Platelet   COMPLETE METABOLIC PANEL WITH GFR   Urinalysis, Routine w reflex microscopic    All medications were reviewed with patient and family and fully reconciled. All questions answered fully, and patient and family members were encouraged to call the office with any further questions or concerns. Discussed goal to avoid readmission related to this   Over 20  minutes of exam, counseling, chart review, and complex, high/moderate level critical decision making was performed this visit.   Future Appointments  Date Time Provider [place]  10/20/2022  8:15 AM [name], MD [facility] None  10/30/2022  9:30 AM Debbora Presto, NP [facility] None  03/05/2023 10:00 AM Alycia Rossetti, NP [facility] None     HPI 76 y.o.female presents alongside husband for a one week follow up month follow up.  She is currently being treated for recurrent UTIs.  She is asymptomatic today.  She is continuing to treat left breast skin candidiasis.  Healing well.  Abdominal wound has completely  healed.  HH is continuing to follow weekly.   BMI is Body mass index is 40.68 kg/m., she  bhas noteen working on diet and exercise. Wt Readings from Last 3 Encounters:  09/25/22 222 lb 6.4 oz (100.9 kg)  09/18/22 224 lb (101.6 kg)  09/09/22 220 lb (99.8 kg)    Current Outpatient Medications (Endocrine & Metabolic):    Dulaglutide (TRULICITY) 3 PO/2.4MP SOPN, Inject  3 MG(0.5 ML) into the skin  weekly for Diabetes (Dx: e10.29) (Patient taking differently: Inject 3 mg as directed every Sunday. Diabetes)   Finerenone (KERENDIA) 10 MG TABS, Take 10 mg by mouth daily. Take  1 tablet  Daily  for Diabetic Kidneys (Patient taking differently: Take 10 mg by mouth daily. Diabetic Kidneys)   insulin NPH-regular Human (70-30) 100 UNIT/ML injection, Patient injects 25 units in the AM and 10 units in the PM. Reduce each insulin dose by 2 units if glucose persistently <100 (Patient taking differently: Inject 5-20 Units into the skin See admin instructions. Patient injects 20 units in the  AM and 5 units in the PM. Reduce each insulin dose by 2 units if glucose persistently <100)   levothyroxine (SYNTHROID) 125 MCG tablet, Take  1 tablet  Daily  on an empty stomach with only water for 30 minutes & no Antacid meds, Calcium or Magnesium for 4 hours & avoid Biotin (Patient taking  differently: Take 125 mcg by mouth daily before breakfast. Take on an empty stomach with only water for 30 minutes & no Antacid meds, Calcium or Magnesium for 4 hours & avoid Biotin)  Current Outpatient Medications (Cardiovascular):    atenolol (TENORMIN) 100 MG tablet, Take 1 tablet by mouth once daily for blood pressure   furosemide (LASIX) 80 MG tablet, TAKE 1 TABLET BY MOUTH 1 TO 2 TIMES PER DAY AS NEEDED FOR FLUID RETENTION OR ANKLE SWELLING   rosuvastatin (CRESTOR) 5 MG tablet, Take 1 tablet once weekly as directed for Cholesterol LDL goal less than 70.   Current Outpatient Medications (Analgesics):    acetaminophen (TYLENOL) 650 MG CR tablet, Take 1,300 mg by mouth every 8 (eight) hours as needed for pain.   allopurinol (ZYLOPRIM) 300 MG tablet, TAKE 1/2 TABLET(150 MG) BY MOUTH DAILY FOR GOUT PREVENTION (Patient taking differently: Take 150 mg by mouth daily. Gout prevention)   aspirin EC 81 MG tablet, Take 81 mg by mouth daily.   colchicine 0.6 MG tablet, Take  1 tablet  Daily  to Prevent Gout                                                                 /                         TAKE                     BY                     MOUTH (Patient taking differently: Take 0.6 mg by mouth daily. Prevent Gout)   traMADol (ULTRAM) 50 MG tablet, Take 1 tablet (50 mg total) by mouth every 6 (six) hours as needed for moderate pain.  Current Outpatient Medications (Hematological):    Cyanocobalamin (B-12) 1000 MCG SUBL, Place 1,000 mcg under the tongue 2 (two) times a week. Saturday and Sunday  Current Outpatient Medications (Other):    Ascorbic Acid (VITAMIN C) 1000 MG tablet, Take 1,000 mg by mouth daily.   Cholecalciferol (VITAMIN D3) 5000 units CAPS, Take 5,000 Units by mouth daily.   ciprofloxacin (CIPRO) 250 MG tablet, Take 1 tablet (250 mg total) by mouth 2 (two) times daily for 10 days.   dorzolamide-timolol (COSOPT) 22.3-6.8 MG/ML ophthalmic solution, Place 1 drop into the left eye 2  (two) times daily.   hyoscyamine (LEVSIN, ANASPAZ) 0.125 MG tablet, Take 1 tablet (0.125 mg total) by mouth every 6 (six) hours as needed. (Patient taking differently: Take 0.125 mg by mouth every 6 (six) hours as needed for bladder spasms.)   Lancets (ONETOUCH ULTRASOFT) lancets, Check blood sugar 3 times daily-DX-E10.9   nystatin (MYCOSTATIN/NYSTOP) powder, Apply 1 Application topically 3 (three) times daily.   nystatin cream (MYCOSTATIN), Apply 1 Application topically 2 (two) times daily.   ondansetron (ZOFRAN-ODT) 4 MG  disintegrating tablet, Take 1 tablet (4 mg total) by mouth every 6 (six) hours as needed for nausea.   ONETOUCH ULTRA test strip, CHECK BLOOD SUGAR 3 TIMES A DAY   polymyxin B 500,000 Units in sodium chloride irrigation 0.9 % 500 mL, Irrigate with 1 Application as directed every 30 (thirty) days. Eye Injection   zinc gluconate 50 MG tablet, Take 50 mg by mouth daily.  Past Medical History:  Diagnosis Date   Allergic rhinitis    Allergy    Anxiety    with medical procedures   Hyperlipidemia    Hypertension    Obesity    OSA (obstructive sleep apnea)    wears CPAP   Type II or unspecified type diabetes mellitus with renal manifestations, not stated as uncontrolled(250.40)    Unspecified hypothyroidism    Vitamin D deficiency      Allergies  Allergen Reactions   Penicillins Swelling   Sulfa Antibiotics Itching and Rash    ROS: all negative except above.   Physical Exam: Filed Weights   09/25/22 1014  Weight: 222 lb 6.4 oz (100.9 kg)     BP 120/68   Pulse 90   Temp (!) 96.8 F (36 C)   Ht $R'5\' 2"'ZX$  (1.575 m)   Wt 222 lb 6.4 oz (100.9 kg)   SpO2 98%   BMI 40.68 kg/m  General Appearance: Well nourished, in no apparent distress. Eyes: PERRLA, EOMs, conjunctiva no swelling or erythema Sinuses: No Frontal/maxillary tenderness ENT/Mouth: Ext aud canals clear, TMs without erythema, bulging. No erythema, swelling, or exudate on post pharynx.  Tonsils not  swollen or erythematous. Hearing normal.  Neck: Supple, thyroid normal.  Respiratory: Respiratory effort normal, BS equal bilaterally without rales, rhonchi, wheezing or stridor.  Cardio: RRR with no MRGs. Brisk peripheral pulses without edema.  Abdomen: Soft, + BS.  Non tender, no guarding, rebound, hernias, masses. Lymphatics: Non tender without lymphadenopathy.  Musculoskeletal: Full ROM, 5/5 strength, normal gait.  Skin: Appropriate color for ethnicity.  Warm.  No rashes.   Neuro: Cranial nerves intact. Normal muscle tone, no cerebellar symptoms. Sensation intact.  Psych: Awake and oriented X 3, normal affect, Insight and Judgment appropriate.     Darrol Jump, NP 10:51 AM [facility]

## 2022-09-26 LAB — COMPLETE METABOLIC PANEL WITH GFR
AG Ratio: 1.1 (calc) (ref 1.0–2.5)
ALT: 12 U/L (ref 6–29)
AST: 21 U/L (ref 10–35)
Albumin: 3.9 g/dL (ref 3.6–5.1)
Alkaline phosphatase (APISO): 71 U/L (ref 37–153)
BUN/Creatinine Ratio: 19 (calc) (ref 6–22)
BUN: 27 mg/dL — ABNORMAL HIGH (ref 7–25)
CO2: 30 mmol/L (ref 20–32)
Calcium: 9.5 mg/dL (ref 8.6–10.4)
Chloride: 102 mmol/L (ref 98–110)
Creat: 1.4 mg/dL — ABNORMAL HIGH (ref 0.60–1.00)
Globulin: 3.7 g/dL (calc) (ref 1.9–3.7)
Glucose, Bld: 106 mg/dL — ABNORMAL HIGH (ref 65–99)
Potassium: 4.2 mmol/L (ref 3.5–5.3)
Sodium: 144 mmol/L (ref 135–146)
Total Bilirubin: 0.8 mg/dL (ref 0.2–1.2)
Total Protein: 7.6 g/dL (ref 6.1–8.1)
eGFR: 39 mL/min/{1.73_m2} — ABNORMAL LOW (ref >=60)

## 2022-09-26 LAB — CBC WITH DIFFERENTIAL/PLATELET
Absolute Monocytes: 518 cells/uL (ref 200–950)
Basophils Absolute: 78 cells/uL (ref 0–200)
Basophils Relative: 1.1 %
Eosinophils Absolute: 646 cells/uL — ABNORMAL HIGH (ref 15–500)
Eosinophils Relative: 9.1 %
HCT: 32.6 % — ABNORMAL LOW (ref 35.0–45.0)
Hemoglobin: 10.5 g/dL — ABNORMAL LOW (ref 11.7–15.5)
Lymphs Abs: 2755 cells/uL (ref 850–3900)
MCH: 29.3 pg (ref 27.0–33.0)
MCHC: 32.2 g/dL (ref 32.0–36.0)
MCV: 91.1 fL (ref 80.0–100.0)
MPV: 13.6 fL — ABNORMAL HIGH (ref 7.5–12.5)
Monocytes Relative: 7.3 %
Neutro Abs: 3103 cells/uL (ref 1500–7800)
Neutrophils Relative %: 43.7 %
Platelets: 369 10*3/uL (ref 140–400)
RBC: 3.58 10*6/uL — ABNORMAL LOW (ref 3.80–5.10)
RDW: 16.2 % — ABNORMAL HIGH (ref 11.0–15.0)
Total Lymphocyte: 38.8 %
WBC: 7.1 10*3/uL (ref 3.8–10.8)

## 2022-09-26 LAB — URINE CULTURE
MICRO NUMBER:: 14013686
Result:: NO GROWTH
SPECIMEN QUALITY:: ADEQUATE

## 2022-09-26 LAB — URINALYSIS, ROUTINE W REFLEX MICROSCOPIC
Bilirubin Urine: NEGATIVE
Glucose, UA: NEGATIVE
Hgb urine dipstick: NEGATIVE
Ketones, ur: NEGATIVE
Leukocytes,Ua: NEGATIVE
Nitrite: NEGATIVE
Protein, ur: NEGATIVE
Specific Gravity, Urine: 1.009 (ref 1.001–1.035)
pH: 6.5 (ref 5.0–8.0)

## 2022-10-20 ENCOUNTER — Encounter (INDEPENDENT_AMBULATORY_CARE_PROVIDER_SITE_OTHER): Payer: Medicare Other | Admitting: Ophthalmology

## 2022-10-20 DIAGNOSIS — H34812 Central retinal vein occlusion, left eye, with macular edema: Secondary | ICD-10-CM

## 2022-10-20 DIAGNOSIS — H43813 Vitreous degeneration, bilateral: Secondary | ICD-10-CM | POA: Diagnosis not present

## 2022-10-20 DIAGNOSIS — H35033 Hypertensive retinopathy, bilateral: Secondary | ICD-10-CM | POA: Diagnosis not present

## 2022-10-20 DIAGNOSIS — I10 Essential (primary) hypertension: Secondary | ICD-10-CM | POA: Diagnosis not present

## 2022-10-28 ENCOUNTER — Ambulatory Visit (INDEPENDENT_AMBULATORY_CARE_PROVIDER_SITE_OTHER): Payer: Medicare Other | Admitting: Nurse Practitioner

## 2022-10-28 ENCOUNTER — Encounter: Payer: Self-pay | Admitting: Nurse Practitioner

## 2022-10-28 VITALS — BP 108/60 | HR 95 | Temp 97.7°F | Ht 62.0 in | Wt 221.2 lb

## 2022-10-28 DIAGNOSIS — E66813 Obesity, class 3: Secondary | ICD-10-CM

## 2022-10-28 DIAGNOSIS — N1832 Chronic kidney disease, stage 3b: Secondary | ICD-10-CM

## 2022-10-28 DIAGNOSIS — Z79899 Other long term (current) drug therapy: Secondary | ICD-10-CM | POA: Diagnosis not present

## 2022-10-28 DIAGNOSIS — E1122 Type 2 diabetes mellitus with diabetic chronic kidney disease: Secondary | ICD-10-CM | POA: Diagnosis not present

## 2022-10-28 DIAGNOSIS — I878 Other specified disorders of veins: Secondary | ICD-10-CM | POA: Diagnosis not present

## 2022-10-28 DIAGNOSIS — B372 Candidiasis of skin and nail: Secondary | ICD-10-CM | POA: Diagnosis not present

## 2022-10-28 DIAGNOSIS — Z6841 Body Mass Index (BMI) 40.0 and over, adult: Secondary | ICD-10-CM

## 2022-10-28 DIAGNOSIS — Z794 Long term (current) use of insulin: Secondary | ICD-10-CM

## 2022-10-28 MED ORDER — NYSTATIN 100000 UNIT/GM EX CREA: 1.0000 | TOPICAL_CREAM | Freq: Two times a day (BID) | CUTANEOUS | 2 refills | Status: DC

## 2022-10-28 NOTE — Progress Notes (Signed)
FOLLOW UP  Assessment and Plan:  Type 2 diabetes mellitus with stage 3b chronic kidney disease, with long-term current use of insulin [facility]) Education: Reviewed 'ABCs' of diabetes management  Discussed goals to be met and/or maintained include A1C (<7) Blood pressure (<130/80) Cholesterol (LDL <70) Continue ACE, bASA, statin Discussed how what you eat and drink can aide in kidney protection. Stay well hydrated. Avoid high salt foods. Avoid NSAIDS. Keep BP and BG well controlled.   Take medications as prescribed. Remain active and exercise as tolerated daily. Maintain weight.  Continue to monitor.  Venous stasis of both lower extremities  Has apt with wound care 10/31/22 Continue to apply topical abx cream Keep covered. Continue to monitor  Candidiasis of the skin Continue Nystatin cream Keep area free of moisture  Medication management All medications discussed and reviewed in full. All questions and concerns regarding medications addressed.    Obesity Discussed appropriate BMI Goal of losing 1 lb per month. Diet modification. Physical activity. Encouraged/praised to build confidence.   Meds ordered this encounter  Medications   nystatin cream (MYCOSTATIN)    Sig: Apply 1 Application topically 2 (two) times daily.    Dispense:  30 g    Refill:  2    Order Specific Question:   Supervising Provider    Answer:   [name] [phone]   All medications were reviewed with patient and family and fully reconciled. All questions answered fully, and patient and family members were encouraged to call the office with any further questions or concerns. Discussed goal to avoid readmission related to this   Over 20 minutes of exam, counseling, chart review, and complex, high/moderate level critical decision making was performed this visit.   Future Appointments  Date Time Provider [place]  10/31/2022  9:00 AM [name], MD [facility]  11/18/2022  11:30 AM Debbora Presto, NP [facility] None  11/24/2022  8:15 AM [name], MD [facility] None  03/05/2023 10:00 AM Alycia Rossetti, NP [facility] None     HPI 76 y.o.female presents for a one month follow up month follow up.  Overall she reports feeling well today.  She endorses LLE with new ulcer x2 days.  She has applied sugar slurry scrub (betadine and sugar mixture) and covered with a bandage.  She has reached out to wound care and has an apt scheduled in 3 days (Friday) with wound care.    She is continuing to c/o rash/skin candidiasis.  Healing well under the breast but has traveled to upper back area.   She reports that Nephrology has discontinued her atenolol, however, she cannot remember the exact medication.  BP is below goal in clinic.  She is trying to stay well hydrated.  BP Readings from Last 3 Encounters:  10/28/22 108/60  09/25/22 120/68  09/18/22 110/66    BMI is Body mass index is 40.46 kg/m., she  has not been working on diet and exercise.  She is trying to start walking with her husband but continues to feel weak and deconditioned from hospital stay.  Wt Readings from Last 3 Encounters:  10/28/22 221 lb 3.2 oz (100.3 kg)  09/25/22 222 lb 6.4 oz (100.9 kg)  09/18/22 224 lb (101.6 kg)    Current Outpatient Medications (Endocrine & Metabolic):    Dulaglutide (TRULICITY) 3 HB/7.1IR SOPN, Inject  3 MG(0.5 ML) into the skin  weekly for Diabetes (Dx: e10.29) (Patient taking differently: Inject 3 mg as directed every Sunday. Diabetes)   Finerenone (  KERENDIA) 10 MG TABS, Take 10 mg by mouth daily. Take  1 tablet  Daily  for Diabetic Kidneys (Patient taking differently: Take 10 mg by mouth daily. Diabetic Kidneys)   insulin NPH-regular Human (70-30) 100 UNIT/ML injection, Patient injects 25 units in the AM and 10 units in the PM. Reduce each insulin dose by 2 units if glucose persistently <100 (Patient taking differently: Inject 5-20 Units into the skin See admin  instructions. Patient injects 20 units in the AM and 5 units in the PM. Reduce each insulin dose by 2 units if glucose persistently <100)   levothyroxine (SYNTHROID) 125 MCG tablet, Take  1 tablet  Daily  on an empty stomach with only water for 30 minutes & no Antacid meds, Calcium or Magnesium for 4 hours & avoid Biotin (Patient taking differently: Take 125 mcg by mouth daily before breakfast. Take on an empty stomach with only water for 30 minutes & no Antacid meds, Calcium or Magnesium for 4 hours & avoid Biotin)  Current Outpatient Medications (Cardiovascular):    furosemide (LASIX) 80 MG tablet, TAKE 1 TABLET BY MOUTH 1 TO 2 TIMES PER DAY AS NEEDED FOR FLUID RETENTION OR ANKLE SWELLING   rosuvastatin (CRESTOR) 5 MG tablet, Take 1 tablet once weekly as directed for Cholesterol LDL goal less than 70.   atenolol (TENORMIN) 100 MG tablet, Take 1 tablet by mouth once daily for blood pressure (Patient not taking: Reported on 10/28/2022)   Current Outpatient Medications (Analgesics):    acetaminophen (TYLENOL) 650 MG CR tablet, Take 1,300 mg by mouth every 8 (eight) hours as needed for pain.   allopurinol (ZYLOPRIM) 300 MG tablet, TAKE 1/2 TABLET(150 MG) BY MOUTH DAILY FOR GOUT PREVENTION (Patient taking differently: Take 150 mg by mouth daily. Gout prevention)   aspirin EC 81 MG tablet, Take 81 mg by mouth daily.   colchicine 0.6 MG tablet, Take  1 tablet  Daily  to Prevent Gout                                                                 /                         TAKE                     BY                     MOUTH (Patient taking differently: Take 0.6 mg by mouth daily. Prevent Gout)   traMADol (ULTRAM) 50 MG tablet, Take 1 tablet (50 mg total) by mouth every 6 (six) hours as needed for moderate pain. (Patient not taking: Reported on 10/28/2022)  Current Outpatient Medications (Hematological):    Cyanocobalamin (B-12) 1000 MCG SUBL, Place 1,000 mcg under the tongue 2 (two) times a week. Saturday  and Sunday  Current Outpatient Medications (Other):    Ascorbic Acid (VITAMIN C) 1000 MG tablet, Take 1,000 mg by mouth daily.   Cholecalciferol (VITAMIN D3) 5000 units CAPS, Take 5,000 Units by mouth daily.   dorzolamide-timolol (COSOPT) 22.3-6.8 MG/ML ophthalmic solution, Place 1 drop into the left eye 2 (two) times daily.   hyoscyamine (LEVSIN, ANASPAZ) 0.125 MG tablet, Take 1 tablet (  0.125 mg total) by mouth every 6 (six) hours as needed. (Patient taking differently: Take 0.125 mg by mouth every 6 (six) hours as needed for bladder spasms.)   Lancets (ONETOUCH ULTRASOFT) lancets, Check blood sugar 3 times daily-DX-E10.9   nystatin (MYCOSTATIN/NYSTOP) powder, Apply 1 Application topically 3 (three) times daily.   nystatin cream (MYCOSTATIN), Apply 1 Application topically 2 (two) times daily.   ondansetron (ZOFRAN-ODT) 4 MG disintegrating tablet, Take 1 tablet (4 mg total) by mouth every 6 (six) hours as needed for nausea.   ONETOUCH ULTRA test strip, CHECK BLOOD SUGAR 3 TIMES A DAY   polymyxin B 500,000 Units in sodium chloride irrigation 0.9 % 500 mL, Irrigate with 1 Application as directed every 30 (thirty) days. Eye Injection   zinc gluconate 50 MG tablet, Take 50 mg by mouth daily.  Past Medical History:  Diagnosis Date   Allergic rhinitis    Allergy    Anxiety    with medical procedures   Hyperlipidemia    Hypertension    Obesity    OSA (obstructive sleep apnea)    wears CPAP   Type II or unspecified type diabetes mellitus with renal manifestations, not stated as uncontrolled(250.40)    Unspecified hypothyroidism    Vitamin D deficiency      Allergies  Allergen Reactions   Penicillins Swelling   Sulfa Antibiotics Itching and Rash    ROS: all negative except above.   Physical Exam: Filed Weights   10/28/22 1051  Weight: 221 lb 3.2 oz (100.3 kg)     BP 108/60   Pulse 95   Temp 97.7 F (36.5 C)   Ht _0  (1.575 m)   Wt 221 lb 3.2 oz (100.3 kg)   SpO2 97%    BMI 40.46 kg/m  General Appearance: Well nourished, in no apparent distress. Eyes: PERRLA, EOMs, conjunctiva no swelling or erythema Sinuses: No Frontal/maxillary tenderness ENT/Mouth: Ext aud canals clear, TMs without erythema, bulging. No erythema, swelling, or exudate on post pharynx.  Tonsils not swollen or erythematous. Hearing normal.  Neck: Supple, thyroid normal.  Respiratory: Respiratory effort normal, BS equal bilaterally without rales, rhonchi, wheezing or stridor.  Cardio: RRR with no MRGs. Brisk peripheral pulses without edema.  Abdomen: Soft, + BS.  Non tender, no guarding, rebound, hernias, masses. Lymphatics: Non tender without lymphadenopathy.  Musculoskeletal: Full ROM, 5/5 strength, normal gait.  Skin: Appropriate color for ethnicity.  Warm.  No rashes.   Neuro: Cranial nerves intact. Normal muscle tone, no cerebellar symptoms. Sensation intact.  Psych: Awake and oriented X 3, normal affect, Insight and Judgment appropriate.     Darrol Jump, NP 11:16 AM [facility]

## 2022-10-30 ENCOUNTER — Ambulatory Visit: Payer: Medicare Other | Admitting: Family Medicine

## 2022-10-31 ENCOUNTER — Encounter (HOSPITAL_BASED_OUTPATIENT_CLINIC_OR_DEPARTMENT_OTHER): Payer: Medicare Other | Attending: General Surgery | Admitting: General Surgery

## 2022-10-31 DIAGNOSIS — Z6839 Body mass index (BMI) 39.0-39.9, adult: Secondary | ICD-10-CM | POA: Insufficient documentation

## 2022-10-31 DIAGNOSIS — L97822 Non-pressure chronic ulcer of other part of left lower leg with fat layer exposed: Secondary | ICD-10-CM | POA: Insufficient documentation

## 2022-10-31 DIAGNOSIS — E1122 Type 2 diabetes mellitus with diabetic chronic kidney disease: Secondary | ICD-10-CM | POA: Diagnosis not present

## 2022-10-31 DIAGNOSIS — I129 Hypertensive chronic kidney disease with stage 1 through stage 4 chronic kidney disease, or unspecified chronic kidney disease: Secondary | ICD-10-CM | POA: Insufficient documentation

## 2022-10-31 DIAGNOSIS — N1831 Chronic kidney disease, stage 3a: Secondary | ICD-10-CM | POA: Insufficient documentation

## 2022-10-31 DIAGNOSIS — E11622 Type 2 diabetes mellitus with other skin ulcer: Secondary | ICD-10-CM | POA: Insufficient documentation

## 2022-10-31 DIAGNOSIS — G4733 Obstructive sleep apnea (adult) (pediatric): Secondary | ICD-10-CM | POA: Insufficient documentation

## 2022-11-03 NOTE — Progress Notes (Signed)
diagnosis (Do you have 2 or more medical diagnoseso) 0 No Ambulatory aid None/bed rest/wheelchair/nurse 0 No Crutches/cane/walker 15 Yes Furniture 0 No Intravenous therapy Access/Saline/Heparin Lock 0 No Gait/Transferring Normal/ bed rest/ wheelchair 0 Yes Weak (short steps with or without shuffle, stooped but able to lift head while walking, may seek 0 No support from furniture) Impaired (short steps with shuffle, may have difficulty arising from chair, head down, impaired 0 No balance) Mental Status Oriented to own ability 0 Yes Electronic Signature(s) Signed: 11/03/2022 12:51:09 PM By: Baruch Gouty RN, BSN Entered By: Baruch Gouty on 10/31/2022 09:16:48 -------------------------------------------------------------------------------- Foot Assessment Details Patient Name: Date of Service: Jocelyn Camacho. 10/31/2022 9:00 A M Medical Record Number: [id number] Patient Account Number: [id number] Date of Birth/Sex: Treating RN: [birth year] (76 y.o. [name] Primary Care Ishaaq Penna: [name] Other Clinician: Referring Marcanthony Sleight: Treating Bryan Goin/Extender: Marily Lente Weeks in Treatment: 0 Foot Assessment Items Site Locations + = Sensation present, - = Sensation absent, C = Callus, U = Ulcer R  = Redness, W = Warmth, M = Maceration, PU = Pre-ulcerative lesion F = Fissure, S = Swelling, D = Dryness Assessment Right: Left: Other Deformity: No No Prior Foot Ulcer: No No Prior Amputation: No No Charcot Joint: No No Ambulatory Status: Ambulatory With Help Assistance Device: MONSERRATT, KNEZEVIC ([id number]) [id number] Page 4 of 4 Gait: Steady Electronic Signature(s) Signed: 11/03/2022 12:51:09 PM By: Baruch Gouty RN, BSN Entered By: Baruch Gouty on 10/31/2022 09:17:40 -------------------------------------------------------------------------------- Nutrition Risk Screening Details Patient Name: Date of Service: Jocelyn Camacho, Jocelyn Camacho 10/31/2022 9:00 A M Medical Record Number: [id number] Patient Account Number: [id number] Date of Birth/Sex: Treating RN: 1946/05/16 (76 y.o. [name] Primary Care Evren Shankland: [name] Other Clinician: Referring Berea Majkowski: Treating Suki Crockett/Extender: Marily Lente Weeks in Treatment: 0 Height (in): 63 Weight (lbs): 221 Body Mass Index (BMI): 39.1 Nutrition Risk Screening Items Score Screening NUTRITION RISK SCREEN: I have an illness or condition that made me change the kind and/or amount of food I eat 0 No I eat fewer than two meals per day 0 No I eat few fruits and vegetables, or milk products 0 No I have three or more drinks of beer, liquor or wine almost every day 0 No I have tooth or mouth problems that make it hard for me to eat 0 No I don't always have enough money to buy the food I need 0 No I eat alone most of the time 0 No I take three or more different prescribed or over-the-counter drugs a day 1 Yes Without wanting to, I have lost or gained 10 pounds in the last six months 0 No I am not always physically able to shop, cook and/or feed myself 0 No Nutrition Protocols Good Risk Protocol 0 No interventions needed Moderate Risk Protocol High Risk Proctocol Risk  Level: Good Risk Score: 1 Electronic Signature(s) Signed: 11/03/2022 12:51:09 PM By: Baruch Gouty RN, BSN Entered By: Baruch Gouty on 10/31/2022 09:17:22  Jocelyn Camacho, Jocelyn Camacho ([id number]) [id number] Page 1 of 4 Visit Report for 10/31/2022 Abuse Risk Screen Details Patient Name: Date of Service: Jocelyn Camacho, Jocelyn Camacho 10/31/2022 9:00 A M Medical Record Number: [id number] Patient Account Number: [id number] Date of Birth/Sex: Treating RN: 10-13-1946 (76 y.o. [name] Primary Care Kareen Hitsman: [name] Other Clinician: Referring Merlie Noga: Treating Jayanth Szczesniak/Extender: Collie Siad in Treatment: 0 Abuse Risk Screen Items Answer ABUSE RISK SCREEN: Has anyone close to you tried to hurt or harm you recentlyo No Do you feel uncomfortable with anyone in your familyo No Has anyone forced you do things that you didnt want to doo No Electronic Signature(s) Signed: 11/03/2022 12:51:09 PM By: Baruch Gouty RN, BSN Entered By: Baruch Gouty on 10/31/2022 09:15:30 -------------------------------------------------------------------------------- Activities of Daily Living Details Patient Name: Date of Service: Jocelyn Camacho, Jocelyn Camacho 10/31/2022 9:00 A M Medical Record Number: [id number] Patient Account Number: [id number] Date of Birth/Sex: Treating RN: 25-Feb-1946 (76 y.o. [name] Primary Care Livio Ledwith: [name] Other Clinician: Referring Dorrien Grunder: Treating Jamicah Anstead/Extender: Marily Lente Weeks in Treatment: 0 Activities of Daily Living Items Answer Activities of Daily Living (Please select one for each item) Drive Automobile Completely Able T Medications ake Completely Able Use T elephone Completely Able Care for Appearance Completely Able Use T oilet Completely Able Bath / Shower Completely Able Dress Self Completely Able Feed Self Completely Able Walk Need Assistance Get In / Out Bed Completely Able Housework Completely Able Prepare Meals Completely [place] for Self Completely Able Electronic  Signature(s) Signed: 11/03/2022 12:51:09 PM By: Baruch Gouty RN, BSN Entered By: Baruch Gouty on 10/31/2022 09:15:53 Randa Spike ([id number]) [phone] [id number] Page 2 of 4 -------------------------------------------------------------------------------- Education Screening Details Patient Name: Date of Service: Jocelyn Camacho, Jocelyn Camacho 10/31/2022 9:00 A M Medical Record Number: [id number] Patient Account Number: [id number] Date of Birth/Sex: Treating RN: 1946/05/02 (76 y.o. [name] Primary Care Sharran Caratachea: [name] Other Clinician: Referring Byrant Valent: Treating Kori Colin/Extender: Collie Siad in Treatment: 0 Primary Learner Assessed: Patient Learning Preferences/Education Level/Primary Language Learning Preference: Explanation, Demonstration, Printed Material Highest Education Level: College or Above Preferred Language: English Cognitive Barrier Language Barrier: No Translator Needed: No Memory Deficit: No Emotional Barrier: No Cultural/Religious Beliefs Affecting Medical Care: No Physical Barrier Impaired Vision: Yes Glasses Impaired Hearing: No Decreased Hand dexterity: No Knowledge/Comprehension Knowledge Level: High Comprehension Level: High Ability to understand written instructions: High Ability to understand verbal instructions: High Motivation Anxiety Level: Calm Cooperation: Cooperative Education Importance: Acknowledges Need Interest in Health Problems: Asks Questions Perception: Coherent Willingness to Engage in Self-Management High Activities: Readiness to Engage in Self-Management High Activities: Electronic Signature(s) Signed: 11/03/2022 12:51:09 PM By: Baruch Gouty RN, BSN Entered By: Baruch Gouty on 10/31/2022 09:16:36 -------------------------------------------------------------------------------- Fall Risk Assessment Details Patient Name: Date of Service: Jocelyn Camacho. 10/31/2022 9:00 A M Medical Record Number: [id number] Patient Account Number: [id number] Date of Birth/Sex: Treating RN: 1946/04/23 (76 y.o. [name] Primary Care Satoru Milich: [name] Other Clinician: Referring Kynsie Falkner: Treating Kamorah Nevils/Extender: Collie Siad in Treatment: 0 Fall Risk Assessment Items Have you had 2 or more falls in the last [address] monthso 0 No Jocelyn Camacho, Jocelyn Camacho ([id number]) [phone] [id number] Page 3 of 4 Have you had any fall that resulted in injury in the last 12 monthso 0 No FALLS RISK SCREEN History of falling - immediate or within 3 months 0 No Secondary

## 2022-11-03 NOTE — Progress Notes (Signed)
N/A Primary Etiology: Diabetic Wound/Ulcer of the Lower N/A N/A Secondary Etiology: Extremity Cataracts, Glaucoma, Sleep Apnea, N/A N/A Comorbid History: Hypertension, Peripheral Venous Disease, Type II Diabetes, Gout, Osteoarthritis 10/24/2022 N/A N/A Date Acquired: 0 N/A N/A Weeks of Treatment: Open N/A N/A Wound Status: No N/A N/A Wound Recurrence: Jocelyn Camacho, Jocelyn Camacho ([id number]) [phone].pdf Page 5 of 9 2x3.7x0.1 N/A N/A Measurements L x W x D (cm) 5.812 N/A N/A A (cm) : rea 0.581 N/A N/A Volume (cm) : Full Thickness Without Exposed N/A N/A Classification: Support Structures Medium N/A N/A Exudate A mount: Serosanguineous N/A N/A Exudate Type: red, brown N/A N/A Exudate Color: Flat and Intact N/A N/A Wound Margin: Large (67-100%) N/A N/A Granulation A  mount: Red, Hyper-granulation N/A N/A Granulation Quality: Small (1-33%) N/A N/A Necrotic A mount: Fat Layer (Subcutaneous Tissue): Yes N/A N/A Exposed Structures: Fascia: No Tendon: No Muscle: No Joint: No Bone: No Small (1-33%) N/A N/A Epithelialization: Debridement - Selective/Open Wound N/A N/A Debridement: Pre-procedure Verification/Time Out 09:35 N/A N/A Taken: Lidocaine 4% Topical Solution N/A N/A Pain Control: Slough N/A N/A Tissue Debrided: Non-Viable Tissue N/A N/A Level: 7.4 N/A N/A Debridement A (sq cm): rea Curette N/A N/A Instrument: Minimum N/A N/A Bleeding: Pressure N/A N/A Hemostasis A chieved: 2 N/A N/A Procedural Pain: 1 N/A N/A Post Procedural Pain: Procedure was tolerated well N/A N/A Debridement Treatment Response: 2x3.7x0.1 N/A N/A Post Debridement Measurements L x W x D (cm) 0.581 N/A N/A Post Debridement Volume: (cm) Scarring: Yes N/A N/A Periwound Skin Texture: No Abnormalities Noted N/A N/A Periwound Skin Moisture: Hemosiderin Staining: Yes N/A N/A Periwound Skin Color: No Abnormality N/A N/A Temperature: Debridement N/A N/A Procedures Performed: Treatment Notes Electronic Signature(s) Signed: 10/31/2022 9:39:31 AM By: [name] MD FACS Entered By: [name] on 10/31/2022 09:39:31 -------------------------------------------------------------------------------- Multi-Disciplinary Care Plan Details Patient Name: Date of Service: Jocelyn Camacho. 10/31/2022 9:00 A M Medical Record Number: [id number] Patient Account Number: [id number] Date of Birth/Sex: Treating RN: [birth year] (76 y.o. Jocelyn Camacho Primary Care Riley Papin: [name] Other Clinician: Referring Dalinda Heidt: Treating Krithika Tome/Extender: Collie Siad in Treatment: [place] reviewed with physician Active Inactive Nutrition Nursing Diagnoses: Impaired glucose control: actual or  potential Potential for alteratiion in Nutrition/Potential for imbalanced nutrition Goals: Patient/caregiver will maintain therapeutic glucose control Date Initiated: 10/31/2022 Target Resolution Date: 11/28/2022 Goal Status: Active Jocelyn Camacho, Jocelyn Camacho ([id number]) [phone].pdf Page 6 of 9 Interventions: Assess HgA1c results as ordered upon admission and as needed Provide education on elevated blood sugars and impact on wound healing Treatment Activities: Patient referred to Primary Care Physician for further nutritional evaluation : 10/31/2022 Notes: Venous Leg Ulcer Nursing Diagnoses: Knowledge deficit related to disease process and management Potential for venous Insuffiency (use before diagnosis confirmed) Goals: Patient will maintain optimal edema control Date Initiated: 10/31/2022 Target Resolution Date: 11/28/2022 Goal Status: Active Interventions: Assess peripheral edema status every visit. Compression as ordered Treatment Activities: Therapeutic compression applied : 10/31/2022 Notes: Wound/Skin Impairment Nursing Diagnoses: Impaired tissue integrity Knowledge deficit related to ulceration/compromised skin integrity Goals: Patient/caregiver will verbalize understanding of skin care regimen Date Initiated: 10/31/2022 Target Resolution Date: 11/28/2022 Goal Status: Active Ulcer/skin breakdown will have a volume reduction of 30% by week 4 Date Initiated: 10/31/2022 Target Resolution Date: 11/28/2022 Goal Status: Active Interventions: Assess patient/caregiver ability to obtain necessary supplies Assess patient/caregiver ability to perform ulcer/skin care regimen upon admission and as needed Assess ulceration(s) every visit Provide education on ulcer and skin care Treatment Activities: Skin  N/A Primary Etiology: Diabetic Wound/Ulcer of the Lower N/A N/A Secondary Etiology: Extremity Cataracts, Glaucoma, Sleep Apnea, N/A N/A Comorbid History: Hypertension, Peripheral Venous Disease, Type II Diabetes, Gout, Osteoarthritis 10/24/2022 N/A N/A Date Acquired: 0 N/A N/A Weeks of Treatment: Open N/A N/A Wound Status: No N/A N/A Wound Recurrence: Jocelyn Camacho, Jocelyn Camacho ([id number]) [phone].pdf Page 5 of 9 2x3.7x0.1 N/A N/A Measurements L x W x D (cm) 5.812 N/A N/A A (cm) : rea 0.581 N/A N/A Volume (cm) : Full Thickness Without Exposed N/A N/A Classification: Support Structures Medium N/A N/A Exudate A mount: Serosanguineous N/A N/A Exudate Type: red, brown N/A N/A Exudate Color: Flat and Intact N/A N/A Wound Margin: Large (67-100%) N/A N/A Granulation A  mount: Red, Hyper-granulation N/A N/A Granulation Quality: Small (1-33%) N/A N/A Necrotic A mount: Fat Layer (Subcutaneous Tissue): Yes N/A N/A Exposed Structures: Fascia: No Tendon: No Muscle: No Joint: No Bone: No Small (1-33%) N/A N/A Epithelialization: Debridement - Selective/Open Wound N/A N/A Debridement: Pre-procedure Verification/Time Out 09:35 N/A N/A Taken: Lidocaine 4% Topical Solution N/A N/A Pain Control: Slough N/A N/A Tissue Debrided: Non-Viable Tissue N/A N/A Level: 7.4 N/A N/A Debridement A (sq cm): rea Curette N/A N/A Instrument: Minimum N/A N/A Bleeding: Pressure N/A N/A Hemostasis A chieved: 2 N/A N/A Procedural Pain: 1 N/A N/A Post Procedural Pain: Procedure was tolerated well N/A N/A Debridement Treatment Response: 2x3.7x0.1 N/A N/A Post Debridement Measurements L x W x D (cm) 0.581 N/A N/A Post Debridement Volume: (cm) Scarring: Yes N/A N/A Periwound Skin Texture: No Abnormalities Noted N/A N/A Periwound Skin Moisture: Hemosiderin Staining: Yes N/A N/A Periwound Skin Color: No Abnormality N/A N/A Temperature: Debridement N/A N/A Procedures Performed: Treatment Notes Electronic Signature(s) Signed: 10/31/2022 9:39:31 AM By: [name] MD FACS Entered By: [name] on 10/31/2022 09:39:31 -------------------------------------------------------------------------------- Multi-Disciplinary Care Plan Details Patient Name: Date of Service: Jocelyn Camacho. 10/31/2022 9:00 A M Medical Record Number: [id number] Patient Account Number: [id number] Date of Birth/Sex: Treating RN: [birth year] (76 y.o. Jocelyn Camacho Primary Care Riley Papin: [name] Other Clinician: Referring Dalinda Heidt: Treating Krithika Tome/Extender: Collie Siad in Treatment: [place] reviewed with physician Active Inactive Nutrition Nursing Diagnoses: Impaired glucose control: actual or  potential Potential for alteratiion in Nutrition/Potential for imbalanced nutrition Goals: Patient/caregiver will maintain therapeutic glucose control Date Initiated: 10/31/2022 Target Resolution Date: 11/28/2022 Goal Status: Active Jocelyn Camacho, Jocelyn Camacho ([id number]) [phone].pdf Page 6 of 9 Interventions: Assess HgA1c results as ordered upon admission and as needed Provide education on elevated blood sugars and impact on wound healing Treatment Activities: Patient referred to Primary Care Physician for further nutritional evaluation : 10/31/2022 Notes: Venous Leg Ulcer Nursing Diagnoses: Knowledge deficit related to disease process and management Potential for venous Insuffiency (use before diagnosis confirmed) Goals: Patient will maintain optimal edema control Date Initiated: 10/31/2022 Target Resolution Date: 11/28/2022 Goal Status: Active Interventions: Assess peripheral edema status every visit. Compression as ordered Treatment Activities: Therapeutic compression applied : 10/31/2022 Notes: Wound/Skin Impairment Nursing Diagnoses: Impaired tissue integrity Knowledge deficit related to ulceration/compromised skin integrity Goals: Patient/caregiver will verbalize understanding of skin care regimen Date Initiated: 10/31/2022 Target Resolution Date: 11/28/2022 Goal Status: Active Ulcer/skin breakdown will have a volume reduction of 30% by week 4 Date Initiated: 10/31/2022 Target Resolution Date: 11/28/2022 Goal Status: Active Interventions: Assess patient/caregiver ability to obtain necessary supplies Assess patient/caregiver ability to perform ulcer/skin care regimen upon admission and as needed Assess ulceration(s) every visit Provide education on ulcer and skin care Treatment Activities: Skin  N/A Primary Etiology: Diabetic Wound/Ulcer of the Lower N/A N/A Secondary Etiology: Extremity Cataracts, Glaucoma, Sleep Apnea, N/A N/A Comorbid History: Hypertension, Peripheral Venous Disease, Type II Diabetes, Gout, Osteoarthritis 10/24/2022 N/A N/A Date Acquired: 0 N/A N/A Weeks of Treatment: Open N/A N/A Wound Status: No N/A N/A Wound Recurrence: Jocelyn Camacho, Jocelyn Camacho ([id number]) [phone].pdf Page 5 of 9 2x3.7x0.1 N/A N/A Measurements L x W x D (cm) 5.812 N/A N/A A (cm) : rea 0.581 N/A N/A Volume (cm) : Full Thickness Without Exposed N/A N/A Classification: Support Structures Medium N/A N/A Exudate A mount: Serosanguineous N/A N/A Exudate Type: red, brown N/A N/A Exudate Color: Flat and Intact N/A N/A Wound Margin: Large (67-100%) N/A N/A Granulation A  mount: Red, Hyper-granulation N/A N/A Granulation Quality: Small (1-33%) N/A N/A Necrotic A mount: Fat Layer (Subcutaneous Tissue): Yes N/A N/A Exposed Structures: Fascia: No Tendon: No Muscle: No Joint: No Bone: No Small (1-33%) N/A N/A Epithelialization: Debridement - Selective/Open Wound N/A N/A Debridement: Pre-procedure Verification/Time Out 09:35 N/A N/A Taken: Lidocaine 4% Topical Solution N/A N/A Pain Control: Slough N/A N/A Tissue Debrided: Non-Viable Tissue N/A N/A Level: 7.4 N/A N/A Debridement A (sq cm): rea Curette N/A N/A Instrument: Minimum N/A N/A Bleeding: Pressure N/A N/A Hemostasis A chieved: 2 N/A N/A Procedural Pain: 1 N/A N/A Post Procedural Pain: Procedure was tolerated well N/A N/A Debridement Treatment Response: 2x3.7x0.1 N/A N/A Post Debridement Measurements L x W x D (cm) 0.581 N/A N/A Post Debridement Volume: (cm) Scarring: Yes N/A N/A Periwound Skin Texture: No Abnormalities Noted N/A N/A Periwound Skin Moisture: Hemosiderin Staining: Yes N/A N/A Periwound Skin Color: No Abnormality N/A N/A Temperature: Debridement N/A N/A Procedures Performed: Treatment Notes Electronic Signature(s) Signed: 10/31/2022 9:39:31 AM By: [name] MD FACS Entered By: [name] on 10/31/2022 09:39:31 -------------------------------------------------------------------------------- Multi-Disciplinary Care Plan Details Patient Name: Date of Service: Jocelyn Camacho. 10/31/2022 9:00 A M Medical Record Number: [id number] Patient Account Number: [id number] Date of Birth/Sex: Treating RN: [birth year] (76 y.o. Jocelyn Camacho Primary Care Riley Papin: [name] Other Clinician: Referring Dalinda Heidt: Treating Krithika Tome/Extender: Collie Siad in Treatment: [place] reviewed with physician Active Inactive Nutrition Nursing Diagnoses: Impaired glucose control: actual or  potential Potential for alteratiion in Nutrition/Potential for imbalanced nutrition Goals: Patient/caregiver will maintain therapeutic glucose control Date Initiated: 10/31/2022 Target Resolution Date: 11/28/2022 Goal Status: Active Jocelyn Camacho, Jocelyn Camacho ([id number]) [phone].pdf Page 6 of 9 Interventions: Assess HgA1c results as ordered upon admission and as needed Provide education on elevated blood sugars and impact on wound healing Treatment Activities: Patient referred to Primary Care Physician for further nutritional evaluation : 10/31/2022 Notes: Venous Leg Ulcer Nursing Diagnoses: Knowledge deficit related to disease process and management Potential for venous Insuffiency (use before diagnosis confirmed) Goals: Patient will maintain optimal edema control Date Initiated: 10/31/2022 Target Resolution Date: 11/28/2022 Goal Status: Active Interventions: Assess peripheral edema status every visit. Compression as ordered Treatment Activities: Therapeutic compression applied : 10/31/2022 Notes: Wound/Skin Impairment Nursing Diagnoses: Impaired tissue integrity Knowledge deficit related to ulceration/compromised skin integrity Goals: Patient/caregiver will verbalize understanding of skin care regimen Date Initiated: 10/31/2022 Target Resolution Date: 11/28/2022 Goal Status: Active Ulcer/skin breakdown will have a volume reduction of 30% by week 4 Date Initiated: 10/31/2022 Target Resolution Date: 11/28/2022 Goal Status: Active Interventions: Assess patient/caregiver ability to obtain necessary supplies Assess patient/caregiver ability to perform ulcer/skin care regimen upon admission and as needed Assess ulceration(s) every visit Provide education on ulcer and skin care Treatment Activities: Skin  N/A Primary Etiology: Diabetic Wound/Ulcer of the Lower N/A N/A Secondary Etiology: Extremity Cataracts, Glaucoma, Sleep Apnea, N/A N/A Comorbid History: Hypertension, Peripheral Venous Disease, Type II Diabetes, Gout, Osteoarthritis 10/24/2022 N/A N/A Date Acquired: 0 N/A N/A Weeks of Treatment: Open N/A N/A Wound Status: No N/A N/A Wound Recurrence: Jocelyn Camacho, Jocelyn Camacho ([id number]) [phone].pdf Page 5 of 9 2x3.7x0.1 N/A N/A Measurements L x W x D (cm) 5.812 N/A N/A A (cm) : rea 0.581 N/A N/A Volume (cm) : Full Thickness Without Exposed N/A N/A Classification: Support Structures Medium N/A N/A Exudate A mount: Serosanguineous N/A N/A Exudate Type: red, brown N/A N/A Exudate Color: Flat and Intact N/A N/A Wound Margin: Large (67-100%) N/A N/A Granulation A  mount: Red, Hyper-granulation N/A N/A Granulation Quality: Small (1-33%) N/A N/A Necrotic A mount: Fat Layer (Subcutaneous Tissue): Yes N/A N/A Exposed Structures: Fascia: No Tendon: No Muscle: No Joint: No Bone: No Small (1-33%) N/A N/A Epithelialization: Debridement - Selective/Open Wound N/A N/A Debridement: Pre-procedure Verification/Time Out 09:35 N/A N/A Taken: Lidocaine 4% Topical Solution N/A N/A Pain Control: Slough N/A N/A Tissue Debrided: Non-Viable Tissue N/A N/A Level: 7.4 N/A N/A Debridement A (sq cm): rea Curette N/A N/A Instrument: Minimum N/A N/A Bleeding: Pressure N/A N/A Hemostasis A chieved: 2 N/A N/A Procedural Pain: 1 N/A N/A Post Procedural Pain: Procedure was tolerated well N/A N/A Debridement Treatment Response: 2x3.7x0.1 N/A N/A Post Debridement Measurements L x W x D (cm) 0.581 N/A N/A Post Debridement Volume: (cm) Scarring: Yes N/A N/A Periwound Skin Texture: No Abnormalities Noted N/A N/A Periwound Skin Moisture: Hemosiderin Staining: Yes N/A N/A Periwound Skin Color: No Abnormality N/A N/A Temperature: Debridement N/A N/A Procedures Performed: Treatment Notes Electronic Signature(s) Signed: 10/31/2022 9:39:31 AM By: [name] MD FACS Entered By: [name] on 10/31/2022 09:39:31 -------------------------------------------------------------------------------- Multi-Disciplinary Care Plan Details Patient Name: Date of Service: Jocelyn Camacho. 10/31/2022 9:00 A M Medical Record Number: [id number] Patient Account Number: [id number] Date of Birth/Sex: Treating RN: [birth year] (76 y.o. Jocelyn Camacho Primary Care Riley Papin: [name] Other Clinician: Referring Dalinda Heidt: Treating Krithika Tome/Extender: Collie Siad in Treatment: [place] reviewed with physician Active Inactive Nutrition Nursing Diagnoses: Impaired glucose control: actual or  potential Potential for alteratiion in Nutrition/Potential for imbalanced nutrition Goals: Patient/caregiver will maintain therapeutic glucose control Date Initiated: 10/31/2022 Target Resolution Date: 11/28/2022 Goal Status: Active Jocelyn Camacho, Jocelyn Camacho ([id number]) [phone].pdf Page 6 of 9 Interventions: Assess HgA1c results as ordered upon admission and as needed Provide education on elevated blood sugars and impact on wound healing Treatment Activities: Patient referred to Primary Care Physician for further nutritional evaluation : 10/31/2022 Notes: Venous Leg Ulcer Nursing Diagnoses: Knowledge deficit related to disease process and management Potential for venous Insuffiency (use before diagnosis confirmed) Goals: Patient will maintain optimal edema control Date Initiated: 10/31/2022 Target Resolution Date: 11/28/2022 Goal Status: Active Interventions: Assess peripheral edema status every visit. Compression as ordered Treatment Activities: Therapeutic compression applied : 10/31/2022 Notes: Wound/Skin Impairment Nursing Diagnoses: Impaired tissue integrity Knowledge deficit related to ulceration/compromised skin integrity Goals: Patient/caregiver will verbalize understanding of skin care regimen Date Initiated: 10/31/2022 Target Resolution Date: 11/28/2022 Goal Status: Active Ulcer/skin breakdown will have a volume reduction of 30% by week 4 Date Initiated: 10/31/2022 Target Resolution Date: 11/28/2022 Goal Status: Active Interventions: Assess patient/caregiver ability to obtain necessary supplies Assess patient/caregiver ability to perform ulcer/skin care regimen upon admission and as needed Assess ulceration(s) every visit Provide education on ulcer and skin care Treatment Activities: Skin  Jocelyn Camacho, Jocelyn Camacho ([id number]) [id number].pdf Page 1 of 9 Visit Report for 10/31/2022 Allergy List Details Patient Name: Date of Service: Jocelyn Camacho, Jocelyn Camacho 10/31/2022 9:00 A M Medical Record Number: [id number] Patient Account Number: [id number] Date of Birth/Sex: Treating RN: 01-22-1946 (76 y.o. Jocelyn Camacho Primary Care Britnee Mcdevitt: [name] Other Clinician: Referring Sema Stangler: Treating Lemond Griffee/Extender: Marily Lente Weeks in Treatment: 0 Allergies Active Allergies penicillin Reaction: swelling Severity: Moderate Sulfa (Sulfonamide Antibiotics) Reaction: itching, rash Severity: Moderate Allergy Notes Electronic Signature(s) Signed: 11/03/2022 12:51:09 PM By: Baruch Gouty RN, BSN Entered By: Baruch Gouty on 10/31/2022 09:11:21 -------------------------------------------------------------------------------- Arrival Information Details Patient Name: Date of Service: Jocelyn Camacho. 10/31/2022 9:00 A M Medical Record Number: [id number] Patient Account Number: [id number] Date of Birth/Sex: Treating RN: 09-29-1946 (76 y.o. F) Primary Care Zarea Diesing: [name] Other Clinician: Referring Loghan Subia: Treating Haelee Bolen/Extender: Collie Siad in Treatment: 0 Visit Information Patient Arrived: Lyndel Pleasure Time: 08:59 Accompanied By: self Transfer Assistance: None Patient Identification Verified: Yes Secondary Verification Process Completed: Yes Patient Requires Transmission-Based Precautions: No Patient Has Alerts: Yes Patient Alerts: L ABI = 1.17 History Since Last Visit All ordered tests and consults were completed: No Added or deleted any medications: No Any new allergies or adverse reactions: No Had a fall or experienced change in activities of daily living that may affect risk of falls: No Signs or symptoms of abuse/neglect since last visito No Hospitalized since last visit:  No Implantable device outside of the clinic excluding cellular tissue based products placed in the center since last visit: No Electronic Signature(s) Signed: 11/03/2022 12:51:09 PM By: Baruch Gouty RN, BSN Entered By: Baruch Gouty on 10/31/2022 09:22:34 Jocelyn Camacho ([id number]) [id number].pdf Page 2 of 9 -------------------------------------------------------------------------------- Clinic Level of Care Assessment Details Patient Name: Date of Service: Jocelyn Camacho, Jocelyn Camacho 10/31/2022 9:00 A M Medical Record Number: [id number] Patient Account Number: [id number] Date of Birth/Sex: Treating RN: 1946/02/04 (76 y.o. Jocelyn Camacho Primary Care Lashauna Arpin: [name] Other Clinician: Referring Aronda Burford: Treating Kutler Vanvranken/Extender: Collie Siad in Treatment: 0 Clinic Level of Care Assessment Items TOOL 1 Quantity Score []  - 0 Use when EandM and Procedure is performed on INITIAL visit ASSESSMENTS - Nursing Assessment / Reassessment X- 1 20 General Physical Exam (combine w/ comprehensive assessment (listed just below) when performed on new pt. evals) X- 1 25 Comprehensive Assessment (HX, ROS, Risk Assessments, Wounds Hx, etc.) ASSESSMENTS - Wound and Skin Assessment / Reassessment []  - 0 Dermatologic / Skin Assessment (not related to wound area) ASSESSMENTS - Ostomy and/or Continence Assessment and Care []  - 0 Incontinence Assessment and Management []  - 0 Ostomy Care Assessment and Management (repouching, etc.) PROCESS - Coordination of Care X - Simple Patient / Family Education for ongoing care 1 15 []  - 0 Complex (extensive) Patient / Family Education for ongoing care X- 1 10 Staff obtains Programmer, systems, Records, T Results / Process Orders est []  - 0 Staff telephones HHA, Nursing Homes / Clarify orders / etc []  - 0 Routine Transfer to another Facility (non-emergent condition) []  - 0 Routine Hospital Admission  (non-emergent condition) X- 1 15 New Admissions / Biomedical engineer / Ordering NPWT Apligraf, etc. , []  - 0 Emergency Hospital Admission (emergent condition) PROCESS - Special Needs []  - 0 Pediatric / Minor Patient Management []  - 0 Isolation Patient Management []  - 0 Hearing / Language / Visual special needs []  - 0 Assessment of Community assistance (transportation, D/C

## 2022-11-03 NOTE — Progress Notes (Signed)
ONIYAH, ROHE ([id number]) [id number] Page 1 of 11 Visit Report for 10/31/2022 Chief Complaint Document Details Patient Name: Date of Service: Jocelyn Camacho, Jocelyn Camacho 10/31/2022 9:00 A M Medical Record Number: [id number] Patient Account Number: [id number] Date of Birth/Sex: Treating RN: [birth year] (76 y.o. F) Primary Care Provider: [name] Other Clinician: Referring Provider: Treating Provider/Extender: Collie Siad in Treatment: 0 Information Obtained from: Patient Chief Complaint Patient presents for treatment of open ulcers due to venous insufficiency Electronic Signature(s) Signed: 10/31/2022 9:41:33 AM By: [name] MD FACS Entered By: [name] on 10/31/2022 09:41:33 -------------------------------------------------------------------------------- Debridement Details Patient Name: Date of Service: Jocelyn Camacho. 10/31/2022 9:00 A M Medical Record Number: [id number] Patient Account Number: [id number] Date of Birth/Sex: Treating RN: May 16, 1946 (76 y.o. [name] Primary Care Provider: [name] Other Clinician: Referring Provider: Treating Provider/Extender: Collie Siad in Treatment: 0 Debridement Performed for Assessment: Wound #3 Left,Lateral Lower Leg Performed By: Physician [name], MD Debridement Type: Debridement Severity of Tissue Pre Debridement: Fat layer exposed Level of Consciousness (Pre-procedure): Awake and Alert Pre-procedure Verification/Time Out Yes - 09:35 Taken: Start Time: 09:35 Pain Control: Lidocaine 4% T opical Solution T Area Debrided (L x W): otal 2 (cm) x 3.7 (cm) = 7.4 (cm) Tissue and other material debrided: Non-Viable, Slough, Slough Level: Non-Viable Tissue Debridement Description: Selective/Open Wound Instrument: Curette Bleeding: Minimum Hemostasis Achieved: Pressure Procedural Pain: 2 Post Procedural  Pain: 1 Response to Treatment: Procedure was tolerated well Level of Consciousness (Post- Awake and Alert procedure): Post Debridement Measurements of Total Wound Length: (cm) 2 Width: (cm) 3.7 Depth: (cm) 0.1 Volume: (cm) 0.581 Character of Wound/Ulcer Post Debridement: Improved Severity of Tissue Post Debridement: Fat layer exposed Jocelyn Camacho, Jocelyn Camacho P ([id number]) [id number].pdf Page 2 of 11 Post Procedure Diagnosis Same as Pre-procedure Notes scribed by Baruch Gouty, RN for Dr. Celine Ahr Electronic Signature(s) Signed: 10/31/2022 12:06:25 PM By: [name] MD FACS Signed: 11/03/2022 12:51:09 PM By: Baruch Gouty RN, BSN Entered By: Baruch Gouty on 10/31/2022 09:45:19 -------------------------------------------------------------------------------- HPI Details Patient Name: Date of Service: Jocelyn Camacho. 10/31/2022 9:00 A M Medical Record Number: [id number] Patient Account Number: [id number] Date of Birth/Sex: Treating RN: Jun 29, 1946 (76 y.o. F) Primary Care Provider: [name] Other Clinician: Referring Provider: Treating Provider/Extender: Collie Siad in Treatment: 0 History of Present Illness HPI Description: ADMISSION 04/08/2022 This is a 76 year old woman with a past medical history significant for type 2 diabetes mellitus with stage III chronic kidney disease, essential hypertension, obesity, and known venous stasis disease. She says that she has had trouble with her left leg for about the past 6 months. She says that wounds will open and then she will get them closed only to have them opened up again. The current episode began in March. She saw her primary care provider on March 14. She was prescribed a 2-week course of antibiotics and was advised to apply Betadine sugar to the wounds. She was seen again yesterday and the doxycycline extended due to some periwound erythema. [name] has worn  compression stockings in the past but they are now at least a year old. She is not certain what degree of compression they provided. ABI in clinic today was 1.17. She does not smoke. 04/15/2022: The proximal wound is closed. The distal wound is much smaller and has a very clean surface. Her venous reflux studies are scheduled for later this week. 04/22/2022: Her wounds are closed  today. The results of her reflux studies done last week are copied here: Summary: Right: - No evidence of deep vein thrombosis seen in the right lower extremity, from the common femoral through the popliteal veins. - No evidence of superficial venous thrombosis in the right lower extremity. - Venous reflux is noted in the right greater saphenous vein in the calf. Left: - No evidence of deep vein thrombosis seen in the left lower extremity, from the common femoral through the popliteal veins. - No evidence of superficial venous thrombosis in the left lower extremity. - Venous reflux is noted in the left sapheno-femoral junction. - Venous reflux is noted in the left greater saphenous vein in the thigh. - Venous reflux is noted in the left short saphenous vein. She has her compression stockings with her. READMISSION 10/31/2022 The patient returns with a reopening of an ulcer in the same location as prior. She said it has been open for about a week. She has been applying Betadine sugar slurry to the site. She reports that she had been wearing her compression stockings and developed the ulcer despite this. Of note, after her reflux studies, she was seen by Dr. Scot Dock who said in his note that should she develop an ulcer again in the future, she would benefit from saphenous vein ablation. ABI 1.17. On her lateral left lower leg, there is an oval ulcer with some slough accumulation on the surface. It is limited to the fat layer. There is also a blister just distal to this. No concern for infection. Electronic  Signature(s) Signed: 10/31/2022 9:43:43 AM By: [name] MD FACS Entered By: [name] on 10/31/2022 09:43:42 Jocelyn Camacho ([id number]) [id number] Page 3 of 11 -------------------------------------------------------------------------------- Physical Exam Details Patient Name: Date of Service: Jocelyn Camacho, Jocelyn Camacho 10/31/2022 9:00 A M Medical Record Number: [id number] Patient Account Number: [id number] Date of Birth/Sex: Treating RN: 1946-09-13 (76 y.o. F) Primary Care Provider: [name] Other Clinician: Referring Provider: Treating Provider/Extender: Marily Lente Weeks in Treatment: 0 Constitutional Slightly hypertensive. . . . No acute distress. Respiratory Normal work of breathing on room air.. Notes 10/31/2022: On her lateral left lower leg, there is an oval ulcer with some slough accumulation on the surface. It is limited to the fat layer. There is also a blister just distal to this. No concern for infection. Electronic Signature(s) Signed: 10/31/2022 9:44:18 AM By: [name] MD FACS Entered By: [name] on 10/31/2022 09:44:17 -------------------------------------------------------------------------------- Physician Orders Details Patient Name: Date of Service: Jocelyn Camacho. 10/31/2022 9:00 A M Medical Record Number: [id number] Patient Account Number: [id number] Date of Birth/Sex: Treating RN: 03-03-1946 (76 y.o. [name] Primary Care Provider: [name] Other Clinician: Referring Provider: Treating Provider/Extender: Collie Siad in Treatment: 0 Verbal / Phone Orders: No Diagnosis Coding ICD-10 Coding Code Description [phone] Non-pressure chronic ulcer of other part of left lower leg with fat layer exposed N18.31 Chronic kidney disease, stage 3a I87.2 Venous insufficiency (chronic) (peripheral) E11.22 Type 2 diabetes mellitus with  diabetic chronic kidney disease E11.622 Type 2 diabetes mellitus with other skin ulcer E66.01 Morbid (severe) obesity due to excess calories G47.33 Obstructive sleep apnea (adult) (pediatric) I10 Essential (primary) hypertension Follow-up Appointments Return A ppointment in 1 week. Other: - Call [facility] V[facility] to schedule appointment to see Dr. Scot Dock for left vein ablation Anesthetic Wound #3 Left,Lateral Lower Leg (In clinic) Topical Lidocaine 4% applied to wound bed Bathing/ Shower/ Hygiene May shower  with protection but do not get wound dressing(s) wet. - purchase cast protector from [facility] or [facility] Edema Control - Lymphedema / SCD / Jocelyn Camacho, Jocelyn Camacho ([id number]) [id number] Page 4 of 11 Bilateral Lower Extremities Elevate legs to the level of the heart or above for 30 minutes daily and/or when sitting, a frequency of: - whenever sitting throughout the day Avoid standing for long periods of time. Exercise regularly Compression stocking or Garment 20-30 mm/Hg pressure to: - right leg daily Wound Treatment Wound #3 - Lower Leg Wound Laterality: Left, Lateral Peri-Wound Care: Sween Lotion (Moisturizing lotion) 1 x Per Week/30 Days Discharge Instructions: Apply moisturizing lotion as directed Prim Dressing: KerraCel Ag Gelling Fiber Dressing, 2x2 in (silver alginate) 1 x Per Week/30 Days ary Discharge Instructions: Apply silver alginate to wound bed as instructed Secondary Dressing: Woven Gauze Sponge, Non-Sterile 4x4 in 1 x Per Week/30 Days Discharge Instructions: Apply over primary dressing as directed. Compression Wrap: ThreePress (3 layer compression wrap) 1 x Per Week/30 Days Discharge Instructions: Apply three layer compression as directed. Consults Vascular - evaluate for vein ablation for recurrent venous ulcer left leg Patient Medications llergies: penicillin, Sulfa (Sulfonamide Antibiotics) A Notifications Medication Indication Start  End prior to debridement 10/31/2022 lidocaine DOSE topical 4 % cream - cream topical Electronic Signature(s) Signed: 10/31/2022 12:06:25 PM By: [name] MD FACS Signed: 11/03/2022 12:51:09 PM By: Baruch Gouty RN, BSN Entered By: Baruch Gouty on 10/31/2022 09:49:03 Prescription 10/31/2022 -------------------------------------------------------------------------------- Marga Melnick P. [name] MD Patient Name: Provider: 01/07/1946 [id number] Date of Birth: NPI#: [id number] Sex: DEA #: [phone] Phone #: License #: [place] Patient Address: [address] [place], [place] [address] [address], [place] [address] [phone] Allergies penicillin; Sulfa (Sulfonamide Antibiotics) Provider's Orders Vascular - evaluate for vein ablation for recurrent venous ulcer left leg Hand Signature: Date(s): Electronic Signature(s) Signed: 10/31/2022 12:06:25 PM By: [name] MD FACS Signed: 11/03/2022 12:51:09 PM By: Baruch Gouty RN, BSN Entered By: Baruch Gouty on 10/31/2022 09:49:03 Jocelyn Camacho, Jocelyn Camacho ([id number]) [id number] Page 5 of 11 -------------------------------------------------------------------------------- Problem List Details Patient Name: Date of Service: Jocelyn Camacho, Jocelyn Camacho 10/31/2022 9:00 A M Medical Record Number: [id number] Patient Account Number: [id number] Date of Birth/Sex: Treating RN: 1946-01-20 (76 y.o. F) Primary Care Provider: [name] Other Clinician: Referring Provider: Treating Provider/Extender: Collie Siad in Treatment: 0 Active Problems ICD-10 Encounter Code Description Active Date MDM Diagnosis [phone] Non-pressure chronic ulcer of other part of left lower leg with fat layer 10/31/2022 No Yes exposed N18.31 Chronic kidney disease, stage 3a 10/31/2022 No Yes I87.2 Venous insufficiency  (chronic) (peripheral) 10/31/2022 No Yes E11.22 Type 2 diabetes mellitus with diabetic chronic kidney disease 10/31/2022 No Yes E11.622 Type 2 diabetes mellitus with other skin ulcer 10/31/2022 No Yes E66.01 Morbid (severe) obesity due to excess calories 10/31/2022 No Yes G47.33 Obstructive sleep apnea (adult) (pediatric) 10/31/2022 No Yes I10 Essential (primary) hypertension 10/31/2022 No Yes Inactive Problems Resolved Problems Electronic Signature(s) Signed: 10/31/2022 9:38:55 AM By: [name] MD FACS Previous Signature: 10/31/2022 8:58:23 AM Version By: [name] MD FACS Entered By: [name] on 10/31/2022 09:38:54 Progress Note Details -------------------------------------------------------------------------------- Jocelyn Camacho ([id number]) [id number] Page 6 of 11 Patient Name: Date of Service: Jocelyn Camacho, Jocelyn Camacho 10/31/2022 9:00 A M Medical Record Number: [id number] Patient Account Number: [id number] Date of Birth/Sex: Treating RN: [birth year] (76 y.o. F) Primary Care Provider: [name] Other Clinician: Referring  Provider: Treating Provider/Extender: Collie Siad in Treatment: 0 Subjective Chief Complaint Information obtained from Patient Patient presents for treatment of open ulcers due to venous insufficiency History of Present Illness (HPI) ADMISSION 04/08/2022 This is a 76 year old woman with a past medical history significant for type 2 diabetes mellitus with stage III chronic kidney disease, essential hypertension, obesity, and known venous stasis disease. She says that she has had trouble with her left leg for about the past 6 months. She says that wounds will open and then she will get them closed only to have them opened up again. The current episode began in March. She saw her primary care provider on March 14. She was prescribed a 2-week course of antibiotics and was advised to apply  Betadine sugar to the wounds. She was seen again yesterday and the doxycycline extended due to some periwound erythema. Ms. Mauney has worn compression stockings in the past but they are now at least a year old. She is not certain what degree of compression they provided. ABI in clinic today was 1.17. She does not smoke. 04/15/2022: The proximal wound is closed. The distal wound is much smaller and has a very clean surface. Her venous reflux studies are scheduled for later this week. 04/22/2022: Her wounds are closed today. The results of her reflux studies done last week are copied here: Summary: Right: - No evidence of deep vein thrombosis seen in the right lower extremity, from the common femoral through the popliteal veins. - No evidence of superficial venous thrombosis in the right lower extremity. - Venous reflux is noted in the right greater saphenous vein in the calf. Left: - No evidence of deep vein thrombosis seen in the left lower extremity, from the common femoral through the popliteal veins. - No evidence of superficial venous thrombosis in the left lower extremity. - Venous reflux is noted in the left sapheno-femoral junction. - Venous reflux is noted in the left greater saphenous vein in the thigh. - Venous reflux is noted in the left short saphenous vein. She has her compression stockings with her. READMISSION 10/31/2022 The patient returns with a reopening of an ulcer in the same location as prior. She said it has been open for about a week. She has been applying Betadine sugar slurry to the site. She reports that she had been wearing her compression stockings and developed the ulcer despite this. Of note, after her reflux studies, she was seen by Dr. Scot Dock who said in his note that should she develop an ulcer again in the future, she would benefit from saphenous vein ablation. ABI 1.17. On her lateral left lower leg, there is an oval ulcer with some slough accumulation on the  surface. It is limited to the fat layer. There is also a blister just distal to this. No concern for infection. Patient History Information obtained from Patient. Allergies penicillin (Severity: Moderate, Reaction: swelling), Sulfa (Sulfonamide Antibiotics) (Severity: Moderate, Reaction: itching, rash) Family History Unknown History. Social History Never smoker, Marital Status - Married, Alcohol Use - Rarely, Drug Use - No History, Caffeine Use - Rarely. Medical History Eyes Patient has history of Cataracts - removed - both eyes, Glaucoma Respiratory Patient has history of Sleep Apnea Cardiovascular Patient has history of Hypertension, Peripheral Venous Disease Endocrine Patient has history of Type II Diabetes Denies history of Type I Diabetes Genitourinary Denies history of End Stage Renal Disease Integumentary (Skin) Denies history of History of Burn Musculoskeletal Patient has history of Gout,  Osteoarthritis Psychiatric Denies history of Anorexia/bulimia, Confinement Anxiety Patient is treated with Insulin. Blood sugar is tested. Jocelyn Camacho, Jocelyn Camacho ([id number]) [id number] Page 7 of 11 Hospitalization/Surgery History - exploratory laparotomy. - bowel resection. - umbilical hernia repair. - tubal ligation. Medical A Surgical History Notes nd Constitutional Symptoms (General Health) morbid obesity Eyes diabetic retinopathy, macular degeneration Cardiovascular Hyperlipidemia Endocrine hyperparathyroidism Genitourinary CKD stage 3 Integumentary (Skin) Vitiligo Review of Systems (ROS) Constitutional Symptoms (General Health) Denies complaints or symptoms of Fatigue, Fever, Chills, Marked Weight Change. Eyes Complains or has symptoms of Glasses / Contacts - readign. Denies complaints or symptoms of Dry Eyes, Vision Changes. Ear/Nose/Mouth/Throat Denies complaints or symptoms of Chronic sinus problems or rhinitis. Respiratory Denies complaints  or symptoms of Chronic or frequent coughs, Shortness of Breath. Cardiovascular Denies complaints or symptoms of Chest pain. Gastrointestinal Denies complaints or symptoms of Frequent diarrhea, Nausea, Vomiting. Endocrine Denies complaints or symptoms of Heat/cold intolerance. Genitourinary Denies complaints or symptoms of Frequent urination. Integumentary (Skin) Complains or has symptoms of Wounds - left lower leg. Musculoskeletal Denies complaints or symptoms of Muscle Pain, Muscle Weakness. Neurologic Denies complaints or symptoms of Numbness/parasthesias. Psychiatric Denies complaints or symptoms of Claustrophobia. Objective Constitutional Slightly hypertensive. No acute distress. Vitals Time Taken: 9:00 AM, Height: 63 in, Weight: 221 lbs, BMI: 39.1, Temperature: 97.9 F, Pulse: 92 bpm, Respiratory Rate: 20 breaths/min, Blood Pressure: 142/79 mmHg, Capillary Blood Glucose: 144 mg/dl. General Notes: glucose per pt report this am Respiratory Normal work of breathing on room air.. General Notes: 10/31/2022: On her lateral left lower leg, there is an oval ulcer with some slough accumulation on the surface. It is limited to the fat layer. There is also a blister just distal to this. No concern for infection. Integumentary (Hair, Skin) Wound #3 status is Open. Original cause of wound was Blister. The date acquired was: 10/24/2022. The wound is located on the Left,Lateral Lower Leg. The wound measures 2cm length x 3.7cm width x 0.1cm depth; 5.812cm^2 area and 0.581cm^3 volume. There is Fat Layer (Subcutaneous Tissue) exposed. There is no tunneling or undermining noted. There is a medium amount of serosanguineous drainage noted. The wound margin is flat and intact. There is large (67- 100%) red, hyper - granulation within the wound bed. There is a small (1-33%) amount of necrotic tissue within the wound bed including Adherent Slough. The periwound skin appearance had no abnormalities noted  for moisture. The periwound skin appearance exhibited: Scarring, Hemosiderin Staining. Periwound temperature was noted as No Abnormality. Assessment Active Problems ICD-10 Non-pressure chronic ulcer of other part of left lower leg with fat layer exposed Chronic kidney disease, stage 3a Venous insufficiency (chronic) (peripheral) Type 2 diabetes mellitus with diabetic chronic kidney disease Type 2 diabetes mellitus with other skin ulcer Morbid (severe) obesity due to excess calories Jocelyn Camacho, Jocelyn Camacho ([id number]) [id number] Page 8 of 11 Obstructive sleep apnea (adult) (pediatric) Essential (primary) hypertension Procedures Wound #3 Pre-procedure diagnosis of Wound #3 is a Venous Leg Ulcer located on the Left,Lateral Lower Leg .Severity of Tissue Pre Debridement is: Fat layer exposed. There was a Selective/Open Wound Non-Viable Tissue Debridement with a total area of 7.4 sq cm performed by [name], MD. With the following instrument(s): Curette to remove Non-Viable tissue/material. Material removed includes [facility] after achieving pain control using Lidocaine 4% Topical Solution. No specimens were taken. A time out was conducted at 09:35, prior to the start of the procedure. A Minimum amount of bleeding was controlled with Pressure. The procedure was tolerated well with  a pain level of 2 throughout and a pain level of 1 following the procedure. Post Debridement Measurements: 2cm length x 3.7cm width x 0.1cm depth; 0.581cm^3 volume. Character of Wound/Ulcer Post Debridement is improved. Severity of Tissue Post Debridement is: Fat layer exposed. Post procedure Diagnosis Wound #3: Same as Pre-Procedure Pre-procedure diagnosis of Wound #3 is a Venous Leg Ulcer located on the Left,Lateral Lower Leg . There was a Three Layer Compression Therapy Procedure by Baruch Gouty, RN. Post procedure Diagnosis Wound #3: Same as Pre-Procedure Plan Follow-up  Appointments: Return Appointment in 1 week. Anesthetic: Wound #3 Left,Lateral Lower Leg: (In clinic) Topical Lidocaine 4% applied to wound bed Bathing/ Shower/ Hygiene: May shower with protection but do not get wound dressing(s) wet. - purchase cast protector from [facility] or [facility] Edema Control - Lymphedema / SCD / Other: Elevate legs to the level of the heart or above for 30 minutes daily and/or when sitting, a frequency of: - whenever sitting throughout the day Avoid standing for long periods of time. Exercise regularly Compression stocking or Garment 20-30 mm/Hg pressure to: - right leg daily The following medication(s) was prescribed: lidocaine topical 4 % cream cream topical for prior to debridement was prescribed at facility WOUND #3: - Lower Leg Wound Laterality: Left, Lateral Peri-Wound Care: Sween Lotion (Moisturizing lotion) 1 x Per Week/30 Days Discharge Instructions: Apply moisturizing lotion as directed Prim Dressing: KerraCel Ag Gelling Fiber Dressing, 2x2 in (silver alginate) 1 x Per Week/30 Days ary Discharge Instructions: Apply silver alginate to wound bed as instructed Secondary Dressing: Woven Gauze Sponge, Non-Sterile 4x4 in 1 x Per Week/30 Days Discharge Instructions: Apply over primary dressing as directed. Com pression Wrap: ThreePress (3 layer compression wrap) 1 x Per Week/30 Days Discharge Instructions: Apply three layer compression as directed. 10/31/2022: This is a 76 year old woman seen previously in our clinic for venous leg ulcer. She does have saphenous reflux. On her lateral left lower leg, there is an oval ulcer with some slough accumulation on the surface. It is limited to the fat layer. There is also a blister just distal to this. No concern for infection. I used a curette to debride the slough from her wound. We will use silver alginate and 3 layer compression. We will refer her back to vascular surgery to discuss saphenous vein ablation. I also  encouraged her to contact Dr. Scot Dock on her own. She will follow-up in 1 week. Electronic Signature(s) Signed: 10/31/2022 9:45:39 AM By: [name] MD FACS Entered By: [name] on 10/31/2022 09:45:39 -------------------------------------------------------------------------------- HxROS Details Patient Name: Date of Service: Jocelyn Camacho. 10/31/2022 9:00 A M Medical Record Number: [id number] Patient Account Number: [id number] Date of Birth/Sex: Treating RN: [birth year] (76 y.o. [address] [place], [place] ([id number]) [id number] Page 9 of 11 Primary Care Provider: [name] Other Clinician: Referring Provider: Treating Provider/Extender: Collie Siad in Treatment: 0 Information Obtained From Patient Constitutional Symptoms (General Health) Complaints and Symptoms: Negative for: Fatigue; Fever; Chills; Marked Weight Change Medical History: Past Medical History Notes: morbid obesity Eyes Complaints and Symptoms: Positive for: Glasses / Contacts - readign Negative for: Dry Eyes; Vision Changes Medical History: Positive for: Cataracts - removed - both eyes; Glaucoma Past Medical History Notes: diabetic retinopathy, macular degeneration Ear/Nose/Mouth/Throat Complaints and Symptoms: Negative for: Chronic sinus problems or rhinitis Respiratory Complaints and Symptoms: Negative for: Chronic or frequent coughs; Shortness of Breath Medical History: Positive for: Sleep Apnea Cardiovascular Complaints and Symptoms: Negative for: Chest pain Medical  History: Positive for: Hypertension; Peripheral Venous Disease Past Medical History Notes: Hyperlipidemia Gastrointestinal Complaints and Symptoms: Negative for: Frequent diarrhea; Nausea; Vomiting Endocrine Complaints and Symptoms: Negative for: Heat/cold intolerance Medical History: Positive for: Type II Diabetes Negative for: Type I  Diabetes Past Medical History Notes: hyperparathyroidism Time with diabetes: 3 years Treated with: Insulin Blood sugar tested every day: Yes Tested : twice a day Genitourinary Complaints and Symptoms: Negative for: Frequent urination Medical History: Negative for: End Stage Renal Disease Past Medical History Notes: CKD stage 3 Integumentary (Skin) Complaints and Symptoms: Jocelyn Camacho, Jocelyn Camacho ([id number]) [phone].pdf Page 10 of 11 Positive for: Wounds - left lower leg Medical History: Negative for: History of Burn Past Medical History Notes: Vitiligo Musculoskeletal Complaints and Symptoms: Negative for: Muscle Pain; Muscle Weakness Medical History: Positive for: Gout; Osteoarthritis Neurologic Complaints and Symptoms: Negative for: Numbness/parasthesias Psychiatric Complaints and Symptoms: Negative for: Claustrophobia Medical History: Negative for: Anorexia/bulimia; Confinement Anxiety Hematologic/Lymphatic Immunological Oncologic HBO Extended History Items Eyes: Eyes: Cataracts Glaucoma Immunizations Pneumococcal Vaccine: Received Pneumococcal Vaccination: Yes Received Pneumococcal Vaccination On or After 60th Birthday: Yes Implantable Devices None Hospitalization / Surgery History Type of Hospitalization/Surgery exploratory laparotomy bowel resection umbilical hernia repair tubal ligation Family and Social History Unknown History: Yes; Never smoker; Marital Status - Married; Alcohol Use: Rarely; Drug Use: No History; Caffeine Use: Rarely; Financial Concerns: No; Food, Clothing or Shelter Needs: No; Support System Lacking: No; Transportation Concerns: No Electronic Signature(s) Signed: 10/31/2022 12:06:25 PM By: [name] MD FACS Signed: 11/03/2022 12:51:09 PM By: Baruch Gouty RN, BSN Entered By: Baruch Gouty on 10/31/2022 09:15:24 -------------------------------------------------------------------------------- SuperBill  Details Patient Name: Date of Service: Jocelyn Camacho. 10/31/2022 Medical Record Number: [id number] Patient Account Number: [id number] Jocelyn Camacho, Jocelyn Camacho ([id number]) [id number] Page 11 of 11 Date of Birth/Sex: Treating RN: June 13, 1946 (76 y.o. F) Primary Care Provider: [name] Other Clinician: Referring Provider: Treating Provider/Extender: Collie Siad in Treatment: 0 Diagnosis Coding ICD-10 Codes Code Description [phone] Non-pressure chronic ulcer of other part of left lower leg with fat layer exposed N18.31 Chronic kidney disease, stage 3a I87.2 Venous insufficiency (chronic) (peripheral) E11.22 Type 2 diabetes mellitus with diabetic chronic kidney disease E11.622 Type 2 diabetes mellitus with other skin ulcer E66.01 Morbid (severe) obesity due to excess calories G47.33 Obstructive sleep apnea (adult) (pediatric) I10 Essential (primary) hypertension Facility Procedures : CPT4 Code: [id number] Description: 99213 - WOUND CARE VISIT-LEV 3 EST PT Modifier: 25 Quantity: 1 : CPT4 Code: 92119417 Description: 40814 - DEBRIDE WOUND 1ST 20 SQ CM OR < ICD-10 Diagnosis Description 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 Modifier: d Quantity: 1 Physician Procedures : CPT4 Code Description Modifier [id number] 14970 - WC PHYS LEVEL 4 - EST PT 24 ICD-10 Diagnosis Description 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d E11.622 Type 2 diabetes mellitus with other skin ulcer  I87.2 Venous insufficiency (chronic) (peripheral) E66.01 Morbid (severe) obesity due to excess calories Quantity: 1 : [id number] 85027 - WC PHYS DEBR WO ANESTH 20 SQ CM ICD-10 Diagnosis Description 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d Quantity: 1 Electronic Signature(s) Signed: 10/31/2022 12:06:25 PM By: [name]  MD FACS Signed: 11/03/2022 12:51:09 PM By: Baruch Gouty RN, BSN Previous Signature: 10/31/2022 9:46:01 AM Version By: [name] MD FACS Entered By: Baruch Gouty on 10/31/2022 10:15:20

## 2022-11-05 ENCOUNTER — Ambulatory Visit (INDEPENDENT_AMBULATORY_CARE_PROVIDER_SITE_OTHER): Payer: Medicare Other | Admitting: Vascular Surgery

## 2022-11-05 ENCOUNTER — Encounter: Payer: Self-pay | Admitting: Vascular Surgery

## 2022-11-05 VITALS — BP 130/73 | HR 88 | Temp 98.0°F | Resp 14 | Ht 62.0 in | Wt 218.0 lb

## 2022-11-05 DIAGNOSIS — I872 Venous insufficiency (chronic) (peripheral): Secondary | ICD-10-CM

## 2022-11-05 NOTE — Progress Notes (Signed)
REASON FOR VISIT:   Follow-up of chronic venous insufficiency.  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  The visit is requested by Dr. [name].  MEDICAL ISSUES:   CHRONIC VENOUS INSUFFICIENCY: This patient has CEAP C6r venous disease (recurrent venous ulcer).  She does appear to have adequate circulation based on my Doppler assessment today.  She has biphasic Doppler signals in both feet.  Based on her previous venous duplex scan she did not have any deep venous reflux.  She had superficial venous reflux in the left great saphenous vein in the proximal thigh and also in the small saphenous vein.  We have again discussed the importance of intermittent leg elevation and the proper positioning for this.  I encouraged her to continue to use her compression dressing as recommended by the wound care center.  I have encouraged her to avoid prolonged sitting and standing.  We discussed the importance of exercise and weight management.  Given that she has had recurrent ulceration I think she would benefit from laser ablation of the left small saphenous vein.  Hopefully this will lower venous pressure and facilitate healing of the wound.  If she continues to have problems and I think she can have staged laser ablation of the left great saphenous vein in the proximal thigh.  We will try to schedule this in the near future.    HPI:   Jocelyn Camacho is a pleasant 76 y.o. female who I saw on 05/02/2022 with chronic venous insufficiency.  She had a wound on her left leg off and on for 6 months.  She had been followed at the wound care center and ultimately the wound is healed.  She was having issues with swelling in the left leg also and some pain.  She had no previous history of DVT.  She has been wearing compression stockings.  She was also elevating her legs.  At that time she had a C5 venous disease.  She had excellent arterial flow.  We discussed conservative measures again  including the importance of daily leg elevation and the proper positioning for this.  I encouraged her to continue to wear her knee-high compression stockings with a gradient of 20 to 30 mmHg in order to lower her risk of developing venous ulcers in the future.  I encouraged her to stay as active as possible.  I encouraged her to avoid prolonged sitting and standing.  We also discussed the importance of maintaining a healthy weight.  I felt that if her symptoms from venous hypertension worsened or she developed a recurrent ulcer she could be considered for laser ablation of the left great saphenous vein in the thigh and possibly the small saphenous vein.  Since I saw her last, she has developed a recurrent ulcer on her left leg a week ago.  She was seen by the wound care center and had a multilayer dressing.  She was referred back to [place] to consider laser ablation for her superficial venous reflux.  She had been wearing her 20 to 30 mmHg compression stockings prior to developing recurrent ulcer.  She also been elevating her legs.  Past Medical History:  Diagnosis Date   Allergic rhinitis    Allergy    Anxiety    with medical procedures   Hyperlipidemia    Hypertension    Obesity    OSA (obstructive sleep apnea)    wears CPAP   Type II or unspecified type diabetes  mellitus with renal manifestations, not stated as uncontrolled(250.40)    Unspecified hypothyroidism    Vitamin D deficiency     Family History  Problem Relation Age of Onset   Breast cancer Mother    Renal Disease Mother    Hypertension Sister    Diabetes Sister    Diabetes Maternal Grandmother    Colon cancer Neg Hx    Esophageal cancer Neg Hx    Stomach cancer Neg Hx    Rectal cancer Neg Hx     SOCIAL HISTORY: Social History   Tobacco Use   Smoking status: Never   Smokeless tobacco: Never  Substance Use Topics   Alcohol use: Not Currently    Comment: very rarely    Allergies  Allergen Reactions   Penicillins  Swelling   Sulfa Antibiotics Itching and Rash    Current Outpatient Medications  Medication Sig Dispense Refill   acetaminophen (TYLENOL) 650 MG CR tablet Take 1,300 mg by mouth every 8 (eight) hours as needed for pain.     allopurinol (ZYLOPRIM) 300 MG tablet TAKE 1/2 TABLET(150 MG) BY MOUTH DAILY FOR GOUT PREVENTION (Patient taking differently: Take 150 mg by mouth daily. Gout prevention) 45 tablet 2   Ascorbic Acid (VITAMIN C) 1000 MG tablet Take 1,000 mg by mouth daily.     aspirin EC 81 MG tablet Take 81 mg by mouth daily.     atenolol (TENORMIN) 100 MG tablet Take 1 tablet by mouth once daily for blood pressure (Patient not taking: Reported on 10/28/2022) 90 tablet 0   Cholecalciferol (VITAMIN D3) 5000 units CAPS Take 5,000 Units by mouth daily.     colchicine 0.6 MG tablet Take  1 tablet  Daily  to Prevent Gout                                                                 /                         TAKE                     BY                     MOUTH (Patient taking differently: Take 0.6 mg by mouth daily. Prevent Gout) 90 tablet 3   Cyanocobalamin (B-12) 1000 MCG SUBL Place 1,000 mcg under the tongue 2 (two) times a week. Saturday and Sunday     dorzolamide-timolol (COSOPT) 22.3-6.8 MG/ML ophthalmic solution Place 1 drop into the left eye 2 (two) times daily.     Dulaglutide (TRULICITY) 3 ZJ/6.9CV SOPN Inject  3 MG(0.5 ML) into the skin  weekly for Diabetes (Dx: e10.29) (Patient taking differently: Inject 3 mg as directed every Sunday. Diabetes) 6 mL 3   Finerenone (KERENDIA) 10 MG TABS Take 10 mg by mouth daily. Take  1 tablet  Daily  for Diabetic Kidneys (Patient taking differently: Take 10 mg by mouth daily. Diabetic Kidneys) 90 tablet 0   furosemide (LASIX) 80 MG tablet TAKE 1 TABLET BY MOUTH 1 TO 2 TIMES PER DAY AS NEEDED FOR FLUID RETENTION OR ANKLE SWELLING 180 tablet 3   hyoscyamine (LEVSIN, ANASPAZ) 0.125 MG tablet Take 1 tablet (  0.125 mg total) by mouth every 6 (six) hours as  needed. (Patient taking differently: Take 0.125 mg by mouth every 6 (six) hours as needed for bladder spasms.) 60 tablet 1   insulin NPH-regular Human (70-30) 100 UNIT/ML injection Patient injects 25 units in the AM and 10 units in the PM. Reduce each insulin dose by 2 units if glucose persistently <100 (Patient taking differently: Inject 5-20 Units into the skin See admin instructions. Patient injects 20 units in the AM and 5 units in the PM. Reduce each insulin dose by 2 units if glucose persistently <100) 10 mL    Lancets (ONETOUCH ULTRASOFT) lancets Check blood sugar 3 times daily-DX-E10.9 300 each 1   levothyroxine (SYNTHROID) 125 MCG tablet Take  1 tablet  Daily  on an empty stomach with only water for 30 minutes & no Antacid meds, Calcium or Magnesium for 4 hours & avoid Biotin (Patient taking differently: Take 125 mcg by mouth daily before breakfast. Take on an empty stomach with only water for 30 minutes & no Antacid meds, Calcium or Magnesium for 4 hours & avoid Biotin) 90 tablet 3   nystatin cream (MYCOSTATIN) Apply 1 Application topically 2 (two) times daily. 30 g 2   ondansetron (ZOFRAN-ODT) 4 MG disintegrating tablet Take 1 tablet (4 mg total) by mouth every 6 (six) hours as needed for nausea. 20 tablet 0   ONETOUCH ULTRA test strip CHECK BLOOD SUGAR 3 TIMES A DAY 300 strip 3   polymyxin B 500,000 Units in sodium chloride irrigation 0.9 % 500 mL Irrigate with 1 Application as directed every 30 (thirty) days. Eye Injection     rosuvastatin (CRESTOR) 5 MG tablet Take 1 tablet once weekly as directed for Cholesterol LDL goal less than 70. 13 tablet 3   traMADol (ULTRAM) 50 MG tablet Take 1 tablet (50 mg total) by mouth every 6 (six) hours as needed for moderate pain. (Patient not taking: Reported on 10/28/2022) 15 tablet 0   zinc gluconate 50 MG tablet Take 50 mg by mouth daily.     No current facility-administered medications for this visit.    REVIEW OF SYSTEMS:  [X]  denotes positive  finding, [ ]  denotes negative finding Cardiac  Comments:  Chest pain or chest pressure:    Shortness of breath upon exertion:    Short of breath when lying flat:    Irregular heart rhythm:        Vascular    Pain in calf, thigh, or hip brought on by ambulation:    Pain in feet at night that wakes you up from your sleep:     Blood clot in your veins:    Leg swelling:  x       Pulmonary    Oxygen at home:    Productive cough:     Wheezing:         Neurologic    Sudden weakness in arms or legs:     Sudden numbness in arms or legs:     Sudden onset of difficulty speaking or slurred speech:    Temporary loss of vision in one eye:     Problems with dizziness:         Gastrointestinal    Blood in stool:     Vomited blood:         Genitourinary    Burning when urinating:     Blood in urine:        Psychiatric    Major depression:  Hematologic    Bleeding problems:    Problems with blood clotting too easily:        Skin    Rashes or ulcers: x Left leg      Constitutional    Fever or chills:     PHYSICAL EXAM:   Vitals:   11/05/22 1105  BP: 130/73  Pulse: 88  Resp: 14  Temp: 98 F (36.7 C)  TempSrc: Temporal  SpO2: 98%  Weight: 218 lb (98.9 kg)  Height: 5\' 2"  (1.575 m)   GENERAL: The patient is a well-nourished female, in no acute distress. The vital signs are documented above. CARDIAC: There is a regular rate and rhythm.  VASCULAR: I could not palpate pedal pulses however she had biphasic signals in the dorsalis pedis and posterior tibial positions bilaterally. She has hyperpigmentation and a venous ulcer on the lateral aspect of her left leg.  I did look at her left great saphenous vein with the SonoSite.  She has reflux in the proximal thigh.  In the proximal thigh the vein then branches and becomes smaller.  It looks like there is also an anterior accessory saphenous vein but this vein is small. I also looked at the small saphenous vein.  This also  has significant reflux.  It is dilated.  Saphenous popliteal junction can clearly be identified. PULMONARY: There is good air exchange bilaterally without wheezing or rales. ABDOMEN: Soft and non-tender with normal pitched bowel sounds.  MUSCULOSKELETAL: There are no major deformities or cyanosis. NEUROLOGIC: No focal weakness or paresthesias are detected. SKIN: There are no ulcers or rashes noted. PSYCHIATRIC: The patient has a normal affect.  DATA:    A summary of the duplex findings on 04/17/2022 are shown on the diagram below.    Deitra Mayo [facility] [phone]

## 2022-11-07 ENCOUNTER — Encounter (HOSPITAL_BASED_OUTPATIENT_CLINIC_OR_DEPARTMENT_OTHER): Payer: Medicare Other | Admitting: General Surgery

## 2022-11-07 DIAGNOSIS — E11622 Type 2 diabetes mellitus with other skin ulcer: Secondary | ICD-10-CM | POA: Diagnosis not present

## 2022-11-07 NOTE — Progress Notes (Signed)
Jocelyn Camacho, Jocelyn Camacho ([id number]) [id number] Page 1 of 7 Visit Report for 11/07/2022 Arrival Information Details Patient Name: Date of Service: Jocelyn Camacho, Jocelyn Camacho 11/07/2022 9:15 A M Medical Record Number: [id number] Patient Account Number: [id number] Date of Birth/Sex: Treating RN: 10-15-1946 (76 y.o. Jocelyn Camacho Primary Care Jocelyn Camacho: Jocelyn Camacho Other Clinician: Referring Jocelyn Camacho: Treating Jocelyn Camacho/Extender: Jocelyn Camacho in Treatment: 1 Visit Information History Since Last Visit Added or deleted any medications: No Patient Arrived: Jocelyn Camacho Any new allergies or adverse reactions: No Arrival Time: 09:42 Had a fall or experienced change in No Accompanied By: self activities of daily living that may affect Transfer Assistance: None risk of falls: Patient Identification Verified: Yes Signs or symptoms of abuse/neglect since last visito No Secondary Verification Process Completed: Yes Hospitalized since last visit: No Patient Requires Transmission-Based Precautions: No Implantable device outside of the clinic excluding No Patient Has Alerts: Yes cellular tissue based products placed in the center Patient Alerts: L ABI = 1.17 since last visit: Has Dressing in Place as Prescribed: Yes Has Compression in Place as Prescribed: Yes Pain Present Now: No Electronic Signature(s) Signed: 11/07/2022 4:44:53 PM By: Jocelyn Camacho Entered By: Jocelyn Camacho on 11/07/2022 09:42:35 -------------------------------------------------------------------------------- Encounter Discharge Information Details Patient Name: Date of Service: Jocelyn Camacho. 11/07/2022 9:15 A M Medical Record Number: [id number] Patient Account Number: [id number] Date of Birth/Sex: Treating RN: 18-Dec-1946 (76 y.o. Jocelyn Camacho Primary Care Melana Hingle: Jocelyn Camacho Other Clinician: Referring Jocelyn Camacho: Treating Jocelyn Camacho/Extender: Jocelyn Camacho in Treatment: 1 Encounter Discharge Information Items Post Procedure Vitals Discharge Condition: Stable Temperature (F): 97.6 Ambulatory Status: Cane Pulse (bpm): 89 Discharge Destination: Home Respiratory Rate (breaths/min): 18 Transportation: Private Auto Blood Pressure (mmHg): 145/83 Accompanied By: self Schedule Follow-up Appointment: Yes Clinical Summary of Care: Patient Declined Electronic Signature(s) Signed: 11/07/2022 4:44:53 PM By: Jocelyn Camacho Entered By: Jocelyn Camacho on 11/07/2022 10:23:26 Jocelyn Camacho ([id number]) [id number] Page 2 of 7 -------------------------------------------------------------------------------- Lower Extremity Assessment Details Patient Name: Date of Service: Jocelyn Camacho, Jocelyn Camacho 11/07/2022 9:15 A M Medical Record Number: [id number] Patient Account Number: [id number] Date of Birth/Sex: Treating RN: [birth year] (76 y.o. Jocelyn Camacho Primary Care Rashana Andrew: Jocelyn Camacho Other Clinician: Referring Jovahn Breit: Treating Jocelyn Camacho/Extender: Jocelyn Camacho Weeks in Treatment: 1 Edema Assessment Assessed: [Left: No] [Right: No] Edema: [Left: Ye] [Right: s] Calf Left: Right: Point of Measurement: From Medial Instep 41 cm Ankle Left: Right: Point of Measurement: From Medial Instep 25 cm Vascular Assessment Pulses: Dorsalis Pedis Palpable: [Left:Yes] Electronic Signature(s) Signed: 11/07/2022 4:44:53 PM By: Jocelyn Camacho Entered By: Jocelyn Camacho on 11/07/2022 09:47:51 -------------------------------------------------------------------------------- Multi Wound Chart Details Patient Name: Date of Service: Jocelyn Camacho. 11/07/2022 9:15 A M Medical Record Number: [id number] Patient Account Number: [id number] Date of Birth/Sex: Treating RN: 12/25/1945 (75 y.o. 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/A N/A Secondary Etiology: Extremity Cataracts, Glaucoma, Sleep Apnea, N/A N/A Comorbid History: Hypertension, Peripheral Venous Disease, Type II Diabetes, Gout, Osteoarthritis 10/24/2022 N/A N/A Date Acquired: 1 N/A N/A Weeks of Treatment: Open N/A N/A Wound Status:  No N/A N/A Wound Recurrence: 1.1x3x0.1 N/A N/A Measurements L x W x D (cm) 2.592 N/A N/A A (cm) : rea 0.259 N/A N/A Volume (cm) : 55.40% N/A N/A % Reduction in A rea: 55.40% N/A N/A % Reduction in Volume: Full Thickness Without Exposed N/A N/A Classification: Support Structures Medium N/A N/A Exudate A mount: Serosanguineous N/A N/A Exudate Type: red, brown N/A N/A Exudate Color: Flat and Intact N/A N/A Wound Margin: Large (67-100%) N/A N/A Granulation A mount: Red, Hyper-granulation N/A N/A Granulation Quality: None Present (0%) N/A N/A Necrotic A mount: Fat Layer (Subcutaneous Tissue): Yes N/A N/A Exposed Structures: Fascia: No Tendon: No Muscle: No Joint: No Bone: No Medium (34-66%) N/A N/A Epithelialization: Debridement - Selective/Open Wound N/A N/A Debridement: Pre-procedure Verification/Time Out 09:53 N/A N/A Taken: Lidocaine 4% Topical Solution N/A N/A Pain Control: Necrotic/Eschar N/A N/A Tissue Debrided: Non-Viable Tissue N/A N/A Level: 3.3 N/A N/A Debridement A (sq cm): rea Curette N/A N/A Instrument: Minimum N/A N/A Bleeding: Pressure N/A N/A Hemostasis A chieved: Procedure  was tolerated well N/A N/A Debridement Treatment Response: 1.1x3x0.1 N/A N/A Post Debridement Measurements L x W x D (cm) 0.259 N/A N/A Post Debridement Volume: (cm) Scarring: Yes N/A N/A Periwound Skin Texture: No Abnormalities Noted N/A N/A Periwound Skin Moisture: Hemosiderin Staining: Yes N/A N/A Periwound Skin Color: No Abnormality N/A N/A Temperature: Debridement N/A N/A Procedures Performed: Treatment Notes Electronic Signature(s) Signed: 11/07/2022 10:02:54 AM By: [name] MD FACS Entered By: [name] on 11/07/2022 10:02:54 -------------------------------------------------------------------------------- Multi-Disciplinary Care Plan Details Patient Name: Date of Service: Jocelyn Camacho. 11/07/2022 9:15 A M Medical Record Number: [id number] Patient Account Number: [id number] Date of Birth/Sex: Treating RN: [birth year] (76 y.o. Jocelyn Camacho Primary Care Willam Munford: Jocelyn Camacho Other Clinician: Referring Khalil Belote: Treating Tieasha Larsen/Extender: Jocelyn Camacho in Treatment: [place] reviewed with physician Jocelyn Camacho, Jocelyn Camacho ([id number]) [id number] Page 4 of 7 Active Inactive Nutrition Nursing Diagnoses: Impaired glucose control: actual or potential Potential for alteratiion in Nutrition/Potential for imbalanced nutrition Goals: Patient/caregiver will maintain therapeutic glucose control Date Initiated: 10/31/2022 Target Resolution Date: 11/28/2022 Goal Status: Active Interventions: Assess HgA1c results as ordered upon admission and as needed Provide education on elevated blood sugars and impact on wound healing Treatment Activities: Patient referred to Primary Care Physician for further nutritional evaluation : 10/31/2022 Notes: Venous Leg Ulcer Nursing Diagnoses: Knowledge deficit related to disease process and management Potential for venous Insuffiency (use before  diagnosis confirmed) Goals: Patient will maintain optimal edema control Date Initiated: 10/31/2022 Target Resolution Date: 11/28/2022 Goal Status: Active Interventions: Assess peripheral edema status every visit. Compression as ordered Treatment Activities: Therapeutic compression applied : 10/31/2022 Notes: Wound/Skin Impairment Nursing Diagnoses: Impaired tissue integrity Knowledge deficit related to ulceration/compromised skin integrity Goals: Patient/caregiver will verbalize understanding of skin care regimen Date Initiated: 10/31/2022 Target Resolution Date: 11/28/2022 Goal Status: Active Ulcer/skin breakdown will have a volume reduction of 30% by week 4 Date Initiated: 10/31/2022 Target Resolution Date: 11/28/2022 Goal Status: Active Interventions: Assess patient/caregiver ability to obtain necessary supplies Assess patient/caregiver ability to perform ulcer/skin care regimen upon admission and as needed Assess ulceration(s) every visit Provide education on ulcer and skin care Treatment Activities: Skin care regimen initiated : 10/31/2022 Topical wound management initiated : 10/31/2022 Notes: Electronic Signature(s) Signed: 11/07/2022 4:44:53 PM By: Jocelyn Camacho Entered By: Jocelyn Camacho on 11/07/2022 09:53:14 Jocelyn Camacho ([id number]) [id number] Page 5 of 7 -------------------------------------------------------------------------------- Pain Assessment Details Patient Name: Date of Service: Jocelyn Camacho, Jocelyn Camacho.  No N/A N/A Wound Recurrence: 1.1x3x0.1 N/A N/A Measurements L x W x D (cm) 2.592 N/A N/A A (cm) : rea 0.259 N/A N/A Volume (cm) : 55.40% N/A N/A % Reduction in A rea: 55.40% N/A N/A % Reduction in Volume: Full Thickness Without Exposed N/A N/A Classification: Support Structures Medium N/A N/A Exudate A mount: Serosanguineous N/A N/A Exudate Type: red, brown N/A N/A Exudate Color: Flat and Intact N/A N/A Wound Margin: Large (67-100%) N/A N/A Granulation A mount: Red, Hyper-granulation N/A N/A Granulation Quality: None Present (0%) N/A N/A Necrotic A mount: Fat Layer (Subcutaneous Tissue): Yes N/A N/A Exposed Structures: Fascia: No Tendon: No Muscle: No Joint: No Bone: No Medium (34-66%) N/A N/A Epithelialization: Debridement - Selective/Open Wound N/A N/A Debridement: Pre-procedure Verification/Time Out 09:53 N/A N/A Taken: Lidocaine 4% Topical Solution N/A N/A Pain Control: Necrotic/Eschar N/A N/A Tissue Debrided: Non-Viable Tissue N/A N/A Level: 3.3 N/A N/A Debridement A (sq cm): rea Curette N/A N/A Instrument: Minimum N/A N/A Bleeding: Pressure N/A N/A Hemostasis A chieved: Procedure  was tolerated well N/A N/A Debridement Treatment Response: 1.1x3x0.1 N/A N/A Post Debridement Measurements L x W x D (cm) 0.259 N/A N/A Post Debridement Volume: (cm) Scarring: Yes N/A N/A Periwound Skin Texture: No Abnormalities Noted N/A N/A Periwound Skin Moisture: Hemosiderin Staining: Yes N/A N/A Periwound Skin Color: No Abnormality N/A N/A Temperature: Debridement N/A N/A Procedures Performed: Treatment Notes Electronic Signature(s) Signed: 11/07/2022 10:02:54 AM By: [name] MD FACS Entered By: [name] on 11/07/2022 10:02:54 -------------------------------------------------------------------------------- Multi-Disciplinary Care Plan Details Patient Name: Date of Service: Jocelyn Camacho. 11/07/2022 9:15 A M Medical Record Number: [id number] Patient Account Number: [id number] Date of Birth/Sex: Treating RN: [birth year] (76 y.o. Jocelyn Camacho Primary Care Willam Munford: Jocelyn Camacho Other Clinician: Referring Khalil Belote: Treating Tieasha Larsen/Extender: Jocelyn Camacho in Treatment: [place] reviewed with physician Jocelyn Camacho, Jocelyn Camacho ([id number]) [id number] Page 4 of 7 Active Inactive Nutrition Nursing Diagnoses: Impaired glucose control: actual or potential Potential for alteratiion in Nutrition/Potential for imbalanced nutrition Goals: Patient/caregiver will maintain therapeutic glucose control Date Initiated: 10/31/2022 Target Resolution Date: 11/28/2022 Goal Status: Active Interventions: Assess HgA1c results as ordered upon admission and as needed Provide education on elevated blood sugars and impact on wound healing Treatment Activities: Patient referred to Primary Care Physician for further nutritional evaluation : 10/31/2022 Notes: Venous Leg Ulcer Nursing Diagnoses: Knowledge deficit related to disease process and management Potential for venous Insuffiency (use before  diagnosis confirmed) Goals: Patient will maintain optimal edema control Date Initiated: 10/31/2022 Target Resolution Date: 11/28/2022 Goal Status: Active Interventions: Assess peripheral edema status every visit. Compression as ordered Treatment Activities: Therapeutic compression applied : 10/31/2022 Notes: Wound/Skin Impairment Nursing Diagnoses: Impaired tissue integrity Knowledge deficit related to ulceration/compromised skin integrity Goals: Patient/caregiver will verbalize understanding of skin care regimen Date Initiated: 10/31/2022 Target Resolution Date: 11/28/2022 Goal Status: Active Ulcer/skin breakdown will have a volume reduction of 30% by week 4 Date Initiated: 10/31/2022 Target Resolution Date: 11/28/2022 Goal Status: Active Interventions: Assess patient/caregiver ability to obtain necessary supplies Assess patient/caregiver ability to perform ulcer/skin care regimen upon admission and as needed Assess ulceration(s) every visit Provide education on ulcer and skin care Treatment Activities: Skin care regimen initiated : 10/31/2022 Topical wound management initiated : 10/31/2022 Notes: Electronic Signature(s) Signed: 11/07/2022 4:44:53 PM By: Jocelyn Camacho Entered By: Jocelyn Camacho on 11/07/2022 09:53:14 Jocelyn Camacho ([id number]) [id number] Page 5 of 7 -------------------------------------------------------------------------------- Pain Assessment Details Patient Name: Date of Service: Jocelyn Camacho, Jocelyn Camacho.  Jocelyn Camacho, Jocelyn Camacho ([id number]) [id number] Page 1 of 7 Visit Report for 11/07/2022 Arrival Information Details Patient Name: Date of Service: Jocelyn Camacho, Jocelyn Camacho 11/07/2022 9:15 A M Medical Record Number: [id number] Patient Account Number: [id number] Date of Birth/Sex: Treating RN: 10-15-1946 (76 y.o. Jocelyn Camacho Primary Care Jocelyn Camacho: Jocelyn Camacho Other Clinician: Referring Jocelyn Camacho: Treating Jocelyn Camacho/Extender: Jocelyn Camacho in Treatment: 1 Visit Information History Since Last Visit Added or deleted any medications: No Patient Arrived: Jocelyn Camacho Any new allergies or adverse reactions: No Arrival Time: 09:42 Had a fall or experienced change in No Accompanied By: self activities of daily living that may affect Transfer Assistance: None risk of falls: Patient Identification Verified: Yes Signs or symptoms of abuse/neglect since last visito No Secondary Verification Process Completed: Yes Hospitalized since last visit: No Patient Requires Transmission-Based Precautions: No Implantable device outside of the clinic excluding No Patient Has Alerts: Yes cellular tissue based products placed in the center Patient Alerts: L ABI = 1.17 since last visit: Has Dressing in Place as Prescribed: Yes Has Compression in Place as Prescribed: Yes Pain Present Now: No Electronic Signature(s) Signed: 11/07/2022 4:44:53 PM By: Jocelyn Camacho Entered By: Jocelyn Camacho on 11/07/2022 09:42:35 -------------------------------------------------------------------------------- Encounter Discharge Information Details Patient Name: Date of Service: Jocelyn Camacho. 11/07/2022 9:15 A M Medical Record Number: [id number] Patient Account Number: [id number] Date of Birth/Sex: Treating RN: 18-Dec-1946 (76 y.o. Jocelyn Camacho Primary Care Melana Hingle: Jocelyn Camacho Other Clinician: Referring Jocelyn Camacho: Treating Jocelyn Camacho/Extender: Jocelyn Camacho in Treatment: 1 Encounter Discharge Information Items Post Procedure Vitals Discharge Condition: Stable Temperature (F): 97.6 Ambulatory Status: Cane Pulse (bpm): 89 Discharge Destination: Home Respiratory Rate (breaths/min): 18 Transportation: Private Auto Blood Pressure (mmHg): 145/83 Accompanied By: self Schedule Follow-up Appointment: Yes Clinical Summary of Care: Patient Declined Electronic Signature(s) Signed: 11/07/2022 4:44:53 PM By: Jocelyn Camacho Entered By: Jocelyn Camacho on 11/07/2022 10:23:26 Jocelyn Camacho ([id number]) [id number] Page 2 of 7 -------------------------------------------------------------------------------- Lower Extremity Assessment Details Patient Name: Date of Service: Jocelyn Camacho, Jocelyn Camacho 11/07/2022 9:15 A M Medical Record Number: [id number] Patient Account Number: [id number] Date of Birth/Sex: Treating RN: [birth year] (76 y.o. Jocelyn Camacho Primary Care Rashana Andrew: Jocelyn Camacho Other Clinician: Referring Jovahn Breit: Treating Jocelyn Camacho/Extender: Jocelyn Camacho Weeks in Treatment: 1 Edema Assessment Assessed: [Left: No] [Right: No] Edema: [Left: Ye] [Right: s] Calf Left: Right: Point of Measurement: From Medial Instep 41 cm Ankle Left: Right: Point of Measurement: From Medial Instep 25 cm Vascular Assessment Pulses: Dorsalis Pedis Palpable: [Left:Yes] Electronic Signature(s) Signed: 11/07/2022 4:44:53 PM By: Jocelyn Camacho Entered By: Jocelyn Camacho on 11/07/2022 09:47:51 -------------------------------------------------------------------------------- Multi Wound Chart Details Patient Name: Date of Service: Jocelyn Camacho. 11/07/2022 9:15 A M Medical Record Number: [id number] Patient Account Number: [id number] Date of Birth/Sex: Treating RN: 12/25/1945 (75 y.o. 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

## 2022-11-07 NOTE — Progress Notes (Signed)
NORALYN, KARIM ([id number]) [id number] Page 1 of 10 Visit Report for 11/07/2022 Chief Complaint Document Details Patient Name: Date of Service: Camacho, Jocelyn 11/07/2022 9:15 A M Medical Record Number: [id number] Patient Account Number: [id number] Date of Birth/Sex: Treating RN: March 25, 1946 (76 y.o. F) Primary Care Provider: [name] Other Clinician: Referring Provider: Treating Provider/Extender: Collie Siad in Treatment: 1 Information Obtained from: Patient Chief Complaint Patient presents for treatment of open ulcers due to venous insufficiency Electronic Signature(s) Signed: 11/07/2022 10:03:29 AM By: [name] MD FACS Entered By: [name] on 11/07/2022 10:03:29 -------------------------------------------------------------------------------- Debridement Details Patient Name: Date of Service: Cherly Anderson. 11/07/2022 9:15 A M Medical Record Number: [id number] Patient Account Number: [id number] Date of Birth/Sex: Treating RN: [birth year] (76 y.o. [name] Primary Care Provider: [name] Other Clinician: Referring Provider: Treating Provider/Extender: Collie Siad in Treatment: 1 Debridement Performed for Assessment: Wound #3 Left,Lateral Lower Leg Performed By: Physician [name], MD Debridement Type: Debridement Severity of Tissue Pre Debridement: Fat layer exposed Level of Consciousness (Pre-procedure): Awake and Alert Pre-procedure Verification/Time Out Yes - 09:53 Taken: Start Time: 09:53 Pain Control: Lidocaine 4% T opical Solution T Area Debrided (L x W): otal 1.1 (cm) x 3 (cm) = 3.3 (cm) Tissue and other material debrided: Non-Viable, Eschar, Biofilm Level: Non-Viable Tissue Debridement Description: Selective/Open Wound Instrument: Curette Bleeding: Minimum Hemostasis Achieved: Pressure Response to Treatment: Procedure  was tolerated well Level of Consciousness (Post- Awake and Alert procedure): Post Debridement Measurements of Total Wound Length: (cm) 1.1 Width: (cm) 3 Depth: (cm) 0.1 Volume: (cm) 0.259 Character of Wound/Ulcer Post Debridement: Improved Severity of Tissue Post Debridement: Fat layer exposed Post Procedure Diagnosis Same as Pre-procedure MAKIA, BOSSI P ([id number]) [id number] Page 2 of 10 Notes scribed for Dr. Celine Ahr by Adline Peals, RN Electronic Signature(s) Signed: 11/07/2022 10:10:15 AM By: [name] MD FACS Signed: 11/07/2022 4:44:53 PM By: Sabas Sous By: Adline Peals on 11/07/2022 09:55:05 -------------------------------------------------------------------------------- HPI Details Patient Name: Date of Service: Cherly Anderson. 11/07/2022 9:15 A M Medical Record Number: [id number] Patient Account Number: [id number] Date of Birth/Sex: Treating RN: [birth year] (76 y.o. 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 She says that she has had trouble with her left leg for about the past 6 months. She says that wounds will open and then she will get them closed only to have them opened up again. The current episode began in March. She saw her primary care provider on March 14. She was prescribed a 2-week course of antibiotics and was advised to apply Betadine sugar to the wounds. She was seen again yesterday and the doxycycline extended due to some periwound erythema. Ms. Hornbaker has worn compression stockings in the past but they are now  at least a year old. She is not certain what degree of compression they provided. ABI in clinic today was 1.17. She does not smoke. 04/15/2022: The proximal wound is closed. The distal wound is much smaller and has a very clean surface. Her venous reflux studies are scheduled for later this week. 04/22/2022: Her wounds are closed today. The results of her reflux studies done last  week are copied here: Summary: Right: - No evidence of deep vein thrombosis seen in the right lower extremity, from the common femoral through the popliteal veins. - No evidence of superficial venous thrombosis in the right lower extremity. - Venous reflux is noted in the right greater saphenous vein in the calf. Left: - No evidence of deep vein thrombosis seen in the left lower extremity, from the common femoral through the popliteal veins. - No evidence of superficial venous thrombosis in the left lower extremity. - Venous reflux is noted in the left sapheno-femoral junction. - Venous reflux is noted in the left greater saphenous vein in the thigh. - Venous reflux is noted in the left short saphenous vein. She has her compression stockings with her. READMISSION 10/31/2022 The patient returns with a reopening of an ulcer in the same location as prior. She said it has been open for about a week. She has been applying Betadine sugar slurry to the site. She reports that she had been wearing her compression stockings and developed the ulcer despite this. Of note, after her reflux studies, she was seen by Dr. Scot Dock who said in his note that should she develop an ulcer again in the future, she would benefit from saphenous vein ablation. ABI 1.17. On her lateral left lower leg, there is an oval ulcer with some slough accumulation on the surface. It is limited to the fat layer. There is also a blister just distal to this. No concern for infection. 11/07/2022: The wound is smaller this week. It is more superficial. It is  quite clean with just a little biofilm and some periwound eschar. She did meet with Dr. Scot Dock and they are planning a saphenous vein ablation in the future. Edema control is good. Electronic Signature(s) Signed: 11/07/2022 10:04:32 AM By: [name] MD FACS Entered By: [name] on 11/07/2022 10:04:31 Randa Spike ([id number]) [id number] Page 3 of 10 -------------------------------------------------------------------------------- Physical Exam Details Patient Name: Date of Service: ALLESSANDRA, BERNARDI 11/07/2022 9:15 A M Medical Record Number: [id number] Patient Account Number: [id number] Date of Birth/Sex: Treating RN: 1946/03/28 (76 y.o. F) Primary Care Provider: [name] Other Clinician: Referring Provider: Treating Provider/Extender: Marily Lente Weeks in Treatment: 1 Constitutional Slightly hypertensive. . . . No acute distress.Marland Kitchen Respiratory Normal work of breathing on room air.. Notes 11/07/2022: The wound is smaller this week. It is more superficial. It is quite clean with just a little biofilm and some periwound eschar. Edema control is good. Electronic Signature(s) Signed: 11/07/2022 10:05:35 AM By: [name] MD FACS Entered By: [name] on 11/07/2022 10:05:35 -------------------------------------------------------------------------------- Physician Orders Details Patient Name: Date of Service: Cherly Anderson. 11/07/2022 9:15 A M Medical Record Number: [id number] Patient Account Number: [id number] Date of Birth/Sex: Treating RN: 1946-09-05 (76 y.o. [name] Primary Care Provider: [name] Other Clinician: Referring Provider: Treating Provider/Extender: Collie Siad in Treatment: 1 Verbal / Phone Orders: No Diagnosis Coding ICD-10 Coding Code Description [phone] Non-pressure chronic ulcer of other part of left lower leg with fat  layer exposed N18.31 Chronic kidney disease, stage 3a I87.2 Venous insufficiency (chronic) (peripheral) E11.22 Type 2 diabetes mellitus with diabetic chronic kidney disease E11.622 Type 2 diabetes mellitus with other skin ulcer E66.01 Morbid (severe) obesity due to excess calories G47.33 Obstructive sleep apnea (adult) (pediatric) I10 Essential (primary) hypertension Follow-up Appointments ppointment in 1 week. - Dr. Celine Ahr - room 1 - 11/22 at 1:15 PM Return A Anesthetic  Wound #3 Left,Lateral Lower Leg (In clinic) Topical Lidocaine 4% applied to wound bed Bathing/ Shower/ Hygiene May shower with protection but do not get wound dressing(s) wet. - purchase cast protector from [facility] or [facility] Edema Control - Lymphedema / SCD / Other Bilateral Lower Extremities SIBLEY, ROLISON ([id number]) [id number] Page 4 of 10 Elevate legs to the level of the heart or above for 30 minutes daily and/or when sitting, a frequency of: - whenever sitting throughout the day A void standing for long periods of time. Exercise regularly Compression stocking or Garment 20-30 mm/Hg pressure to: - right leg daily Wound Treatment Wound #3 - Lower Leg Wound Laterality: Left, Lateral Peri-Wound Care: Sween Lotion (Moisturizing lotion) 1 x Per Week/30 Days Discharge Instructions: Apply moisturizing lotion as directed Prim Dressing: KerraCel Ag Gelling Fiber Dressing, 2x2 in (silver alginate) 1 x Per Week/30 Days ary Discharge Instructions: Apply silver alginate to wound bed as instructed Secondary Dressing: Woven Gauze Sponge, Non-Sterile 4x4 in 1 x Per Week/30 Days Discharge Instructions: Apply over primary dressing as directed. Compression Wrap: ThreePress (3 layer compression wrap) 1 x Per Week/30 Days Discharge Instructions: Apply three layer compression as directed. Patient Medications llergies: penicillin, Sulfa (Sulfonamide Antibiotics) A Notifications Medication Indication  Start End 11/07/2022 lidocaine DOSE topical 4 % cream - cream topical Electronic Signature(s) Signed: 11/07/2022 10:10:15 AM By: [name] MD FACS Entered By: [name] on 11/07/2022 10:05:46 -------------------------------------------------------------------------------- Problem List Details Patient Name: Date of Service: Cherly Anderson. 11/07/2022 9:15 A M Medical Record Number: [id number] Patient Account Number: [id number] Date of Birth/Sex: Treating RN: 06-20-1946 (76 y.o. F) Primary Care Provider: [name] Other Clinician: Referring Provider: Treating Provider/Extender: Collie Siad in Treatment: 1 Active Problems ICD-10 Encounter Code Description Active Date MDM Diagnosis [phone] Non-pressure chronic ulcer of other part of left lower leg with fat layer 10/31/2022 No Yes exposed N18.31 Chronic kidney disease, stage 3a 10/31/2022 No Yes I87.2 Venous insufficiency (chronic) (peripheral) 10/31/2022 No Yes E11.22 Type 2 diabetes mellitus with diabetic chronic kidney disease 10/31/2022 No Yes E11.622 Type 2 diabetes mellitus with other skin ulcer 10/31/2022 No Yes SHARNI, NEGRON ([id number]) [id number] Page 5 of 10 E66.01 Morbid (severe) obesity due to excess calories 10/31/2022 No Yes G47.33 Obstructive sleep apnea (adult) (pediatric) 10/31/2022 No Yes I10 Essential (primary) hypertension 10/31/2022 No Yes Inactive Problems Resolved Problems Electronic Signature(s) Signed: 11/07/2022 10:02:47 AM By: [name] MD FACS Entered By: [name] on 11/07/2022 10:02:47 -------------------------------------------------------------------------------- Progress Note Details Patient Name: Date of Service: Cherly Anderson. 11/07/2022 9:15 A M Medical Record Number: [id number] Patient Account Number: [id number] Date of Birth/Sex: Treating RN: 1946-03-19 (76 y.o. 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 She says that she has had trouble with her left leg for about the past 6 months. She says that wounds will open and then she will get them closed only to have them opened up again. The current episode began in March. She saw her primary care provider on March 14. She was prescribed a 2-week course of antibiotics and was advised  to apply Betadine sugar to the wounds. She was seen again yesterday and the doxycycline extended due to some periwound erythema. Ms. Kirkey has worn compression stockings in the past but they are now at least a year old. She is not certain what degree of compression they provided. ABI in clinic today was 1.17. She does not smoke. 04/15/2022: The proximal wound is closed. The distal wound is much smaller and has a very clean surface. Her venous reflux studies are scheduled for later this week. 04/22/2022: Her wounds are closed today. The results of her reflux studies done last week are copied here: Summary: Right: - No evidence of deep vein thrombosis seen in the right lower extremity, from the common femoral through the popliteal veins. - No evidence of superficial venous thrombosis in the right lower extremity. - Venous reflux is noted in the right greater saphenous vein in the calf. Left: - No evidence of deep vein thrombosis seen in the left lower extremity, from the common femoral through the popliteal veins. - No evidence of superficial venous  thrombosis in the left lower extremity. - Venous reflux is noted in the left sapheno-femoral junction. - Venous reflux is noted in the left greater saphenous vein in the thigh. - Venous reflux is noted in the left short saphenous vein. She has her compression stockings with her. READMISSION 10/31/2022 The patient returns with a reopening of an ulcer in the same location as prior. She said it has been open for about a week. She has been applying Betadine sugar slurry to the site. She reports that she had been wearing her compression stockings and developed the ulcer despite this. Of note, after her reflux studies, she was seen by Dr. Scot Dock who said in his note that should she develop an ulcer again in the future, she would benefit from saphenous vein DALAYA, SUPPA P ([id number]) [id number] Page 6 of 10 ablation. ABI 1.17. On her lateral left lower leg, there is an oval ulcer with some slough accumulation on the surface. It is limited to the fat layer. There is also a blister just distal to this. No concern for infection. 11/07/2022: The wound is smaller this week. It is more superficial. It is quite clean with just a little biofilm and some periwound eschar. She did meet with Dr. Scot Dock and they are planning a saphenous vein ablation in the future. Edema control is good. Patient History Information obtained from Patient. Family History Unknown History. Social History Never smoker, Marital Status - Married, Alcohol Use - Rarely, Drug Use - No History, Caffeine Use - Rarely. Medical History Eyes Patient has history of Cataracts - removed - both eyes, Glaucoma Respiratory Patient has history of Sleep Apnea Cardiovascular Patient has history of Hypertension, Peripheral Venous Disease Endocrine Patient has history of Type II Diabetes Denies history of Type I Diabetes Genitourinary Denies history of End Stage Renal Disease Integumentary (Skin) Denies history  of History of Burn Musculoskeletal Patient has history of Gout, Osteoarthritis Psychiatric Denies history of Anorexia/bulimia, Confinement Anxiety Hospitalization/Surgery History - exploratory laparotomy. - bowel resection. - umbilical hernia repair. - tubal ligation. Medical A Surgical History Notes nd Constitutional Symptoms (General Health) morbid obesity Eyes diabetic retinopathy, macular degeneration Cardiovascular Hyperlipidemia Endocrine hyperparathyroidism Genitourinary CKD stage 3 Integumentary (Skin) Vitiligo Objective Constitutional Slightly hypertensive. No acute distress.. Vitals Time Taken: 9:43 AM, Height: 63 in, Weight: 221 lbs, BMI: 39.1, Temperature: 97.6 F, Pulse: 89 bpm, Respiratory Rate: 18 breaths/min, Blood Pressure: 145/83 mmHg, Capillary Blood Glucose: 87 mg/dl.  Respiratory Normal work of breathing on room air.. General Notes: 11/07/2022: The wound is smaller this week. It is more superficial. It is quite clean with just a little biofilm and some periwound eschar. Edema control is good. Integumentary (Hair, Skin) Wound #3 status is Open. Original cause of wound was Blister. The date acquired was: 10/24/2022. The wound has been in treatment 1 weeks. The wound is located on the Left,Lateral Lower Leg. The wound measures 1.1cm length x 3cm width x 0.1cm depth; 2.592cm^2 area and 0.259cm^3 volume. There is Fat Layer (Subcutaneous Tissue) exposed. There is no tunneling or undermining noted. There is a medium amount of serosanguineous drainage noted. The wound margin is flat and intact. There is large (67-100%) red, hyper - granulation within the wound bed. There is no necrotic tissue within the wound bed. The periwound skin appearance had no abnormalities noted for moisture. The periwound skin appearance exhibited: Scarring, Hemosiderin Staining. Periwound temperature was noted as No Abnormality. ROBENA, EWY ([id number])  [id number] Page 7 of 10 Assessment Active Problems ICD-10 Non-pressure chronic ulcer of other part of left lower leg with fat layer exposed Chronic kidney disease, stage 3a Venous insufficiency (chronic) (peripheral) Type 2 diabetes mellitus with diabetic chronic kidney disease Type 2 diabetes mellitus with other skin ulcer Morbid (severe) obesity due to excess calories Obstructive sleep apnea (adult) (pediatric) Essential (primary) hypertension Procedures Wound #3 Pre-procedure diagnosis of Wound #3 is a Venous Leg Ulcer located on the Left,Lateral Lower Leg .Severity of Tissue Pre Debridement is: Fat layer exposed. There was a Selective/Open Wound Non-Viable Tissue Debridement with a total area of 3.3 sq cm performed by [name], MD. With the following instrument(s): Curette to remove Non-Viable tissue/material. Material removed includes Eschar and Biofilm and after achieving pain control using Lidocaine 4% Topical Solution. No specimens were taken. A time out was conducted at 09:53, prior to the start of the procedure. A Minimum amount of bleeding was controlled with Pressure. The procedure was tolerated well. Post Debridement Measurements: 1.1cm length x 3cm width x 0.1cm depth; 0.259cm^3 volume. Character of Wound/Ulcer Post Debridement is improved. Severity of Tissue Post Debridement is: Fat layer exposed. Post procedure Diagnosis Wound #3: Same as Pre-Procedure General Notes: scribed for Dr. Celine Ahr by Adline Peals, RN. Plan Follow-up Appointments: Return Appointment in 1 week. - Dr. Celine Ahr - room 1 - 11/22 at 1:15 PM Anesthetic: Wound #3 Left,Lateral Lower Leg: (In clinic) Topical Lidocaine 4% applied to wound bed Bathing/ Shower/ Hygiene: May shower with protection but do not get wound dressing(s) wet. - purchase cast protector from [facility] or [facility] Edema Control - Lymphedema / SCD / Other: Elevate legs to the level of the heart or  above for 30 minutes daily and/or when sitting, a frequency of: - whenever sitting throughout the day Avoid standing for long periods of time. Exercise regularly Compression stocking or Garment 20-30 mm/Hg pressure to: - right leg daily The following medication(s) was prescribed: lidocaine topical 4 % cream cream topical was prescribed at facility WOUND #3: - Lower Leg Wound Laterality: Left, Lateral Peri-Wound Care: Sween Lotion (Moisturizing lotion) 1 x Per Week/30 Days Discharge Instructions: Apply moisturizing lotion as directed Prim Dressing: KerraCel Ag Gelling Fiber Dressing, 2x2 in (silver alginate) 1 x Per Week/30 Days ary Discharge Instructions: Apply silver alginate to wound bed as instructed Secondary Dressing: Woven Gauze Sponge, Non-Sterile 4x4 in 1 x Per Week/30 Days Discharge Instructions: Apply over primary dressing as directed. Com pression Wrap: ThreePress (3 layer compression wrap) 1  x Per Week/30 Days Discharge Instructions: Apply three layer compression as directed. 11/07/2022: The wound is smaller this week. It is more superficial. It is quite clean with just a little biofilm and some periwound eschar. Edema control is good. I used a curette to debride eschar and biofilm from her wound. We will continue silver alginate with 3 layer compression. I am hopeful that the saphenous vein ablation will or even eliminate future episodes of wounding for this patient. She will have a nurse visit next week due to the Thanksgiving holiday and I will see her in 2 weeks. Electronic Signature(s) Signed: 11/07/2022 10:06:28 AM By: [name] MD FACS Entered By: [name] on 11/07/2022 10:06:28 Randa Spike ([id number]) [id number] Page 8 of 10 -------------------------------------------------------------------------------- HxROS Details Patient Name: Date of Service: WALIDA, CAJAS 11/07/2022 9:15 A M Medical Record Number:  [id number] Patient Account Number: [id number] Date of Birth/Sex: Treating RN: May 26, 1946 (76 y.o. F) Primary Care Provider: [name] Other Clinician: Referring Provider: Treating Provider/Extender: Collie Siad in Treatment: 1 Information Obtained From Patient Constitutional Symptoms (General Health) Medical History: Past Medical History Notes: morbid obesity Eyes Medical History: Positive for: Cataracts - removed - both eyes; Glaucoma Past Medical History Notes: diabetic retinopathy, macular degeneration Respiratory Medical History: Positive for: Sleep Apnea Cardiovascular Medical History: Positive for: Hypertension; Peripheral Venous Disease Past Medical History Notes: Hyperlipidemia Endocrine Medical History: Positive for: Type II Diabetes Negative for: Type I Diabetes Past Medical History Notes: hyperparathyroidism Time with diabetes: 3 years Treated with: Insulin Blood sugar tested every day: Yes Tested : twice a day Genitourinary Medical History: Negative for: End Stage Renal Disease Past Medical History Notes: CKD stage 3 Integumentary (Skin) Medical History: Negative for: History of Burn Past Medical History Notes: Vitiligo Musculoskeletal Medical History: Positive for: Gout; Osteoarthritis Psychiatric Medical History: Negative for: Anorexia/bulimia; Confinement Anxiety HBO Extended History Items Eyes: Eyes: Cataracts Glaucoma Immunizations SARH, KIRSCHENBAUM ([id number]) [id number] Page 9 of 10 Pneumococcal Vaccine: Received Pneumococcal Vaccination: Yes Received Pneumococcal Vaccination On or After 60th Birthday: Yes Implantable Devices None Hospitalization / Surgery History Type of Hospitalization/Surgery exploratory laparotomy bowel resection umbilical hernia repair tubal ligation Family and Social History Unknown History: Yes; Never smoker; Marital Status - Married; Alcohol  Use: Rarely; Drug Use: No History; Caffeine Use: Rarely; Financial Concerns: No; Food, Clothing or Shelter Needs: No; Support System Lacking: No; Transportation Concerns: No Electronic Signature(s) Signed: 11/07/2022 10:10:15 AM By: [name] MD FACS Entered By: [name] on 11/07/2022 10:05:03 -------------------------------------------------------------------------------- SuperBill Details Patient Name: Date of Service: Cherly Anderson. 11/07/2022 Medical Record Number: [id number] Patient Account Number: [id number] Date of Birth/Sex: Treating RN: 1946-04-03 (76 y.o. F) Primary Care Provider: [name] Other Clinician: Referring Provider: Treating Provider/Extender: Collie Siad in Treatment: 1 Diagnosis Coding ICD-10 Codes Code Description [phone] Non-pressure chronic ulcer of other part of left lower leg with fat layer exposed N18.31 Chronic kidney disease, stage 3a I87.2 Venous insufficiency (chronic) (peripheral) E11.22 Type 2 diabetes mellitus with diabetic chronic kidney disease E11.622 Type 2 diabetes mellitus with other skin ulcer E66.01 Morbid (severe) obesity due to excess calories G47.33 Obstructive sleep apnea (adult) (pediatric) I10 Essential (primary) hypertension Facility Procedures : CPT4 Code: [id number] Description: 50539 - DEBRIDE WOUND 1ST 20 SQ CM OR < ICD-10 Diagnosis Description 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 Modifier: d Quantity: 1 Physician Procedures : CPT4 Code Description Modifier [id number] -  WC PHYS LEVEL 3 - EST PT 25 ICD-10 Diagnosis Description 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d I87.2 Venous insufficiency (chronic) (peripheral) E11.622  Type 2 diabetes mellitus with other skin ulcer E66.01 Morbid (severe) obesity due to excess calories Quantity: 1 : [id number] - WC PHYS DEBR WO  ANESTH 20 SQ CM ICD-10 Diagnosis Description KIYANNA, BIEGLER ([id number]) [id number] ICD-10 Diagnosis Description L97.822 Non-pressure chronic ulcer of other part of left lower leg with fat layer  exposed Quantity: 1 .pdf Page 10 of 10 Electronic Signature(s) Signed: 11/07/2022 10:06:50 AM By: [name] MD FACS Entered By: [name] on 11/07/2022 10:06:49

## 2022-11-12 ENCOUNTER — Encounter (HOSPITAL_BASED_OUTPATIENT_CLINIC_OR_DEPARTMENT_OTHER): Payer: Medicare Other | Admitting: General Surgery

## 2022-11-12 DIAGNOSIS — E11622 Type 2 diabetes mellitus with other skin ulcer: Secondary | ICD-10-CM | POA: Diagnosis not present

## 2022-11-12 NOTE — Progress Notes (Signed)
in Area: 100% 0 % Reduction in Volume: 100% 0 Epithelialization: Large (67-100%) 0 Tunneling: No 0 Undermining: No Wound Description Classification: Full Thickness Without Exposed Support Structures Exudate Amount: None Present Jocelyn Camacho, Jocelyn Camacho ([id number]) Foul Odor After Cleansing: No Slough/Fibrino No [id number] Page 7 of 7 Wound Bed Granulation Amount: None Present (0%) Exposed Structure Necrotic Amount: None  Present (0%) Fascia Exposed: No Fat Layer (Subcutaneous Tissue) Exposed: No Tendon Exposed: No Muscle Exposed: No Joint Exposed: No Bone Exposed: No Periwound Skin Texture Texture Color No Abnormalities Noted: No No Abnormalities Noted: No Scarring: Yes Hemosiderin Staining: Yes Moisture Temperature / Pain No Abnormalities Noted: Yes Temperature: No Abnormality Electronic Signature(s) Signed: 11/12/2022 3:43:48 PM By: Baruch Gouty RN, BSN Previous Signature: 11/12/2022 1:31:56 PM Version By: Worthy Rancher Entered By: Baruch Gouty on 11/12/2022 13:48:42 -------------------------------------------------------------------------------- [place] Details Patient Name: Date of Service: Jocelyn Camacho. 11/12/2022 1:15 PM Medical Record Number: [id number] Patient Account Number: [id number] Date of Birth/Sex: Treating RN: [birth year] (76 y.o. F) Primary Care Irvin Lizama: [name] Other Clinician: Referring Lema Heinkel: Treating Adith Tejada/Extender: Collie Siad in Treatment: 1 Vital Signs Time Taken: 12:50 Temperature (F): 97.9 Height (in): 63 Pulse (bpm): 92 Weight (lbs): 221 Respiratory Rate (breaths/min): 20 Body Mass Index (BMI): 39.1 Blood Pressure (mmHg): 118/76 Capillary Blood Glucose (mg/dl): 91 Reference Range: 80 - 120 mg / dl Notes glucose per pt report this am Electronic Signature(s) Signed: 11/12/2022 3:43:48 PM By: Baruch Gouty RN, BSN Previous Signature: 11/12/2022 1:31:56 PM Version By: Worthy Rancher Entered By: Baruch Gouty on 11/12/2022 13:46:54  Measurement X - Simple Wound Cleansing - one wound 1 5 []  - 0 Complex Wound Cleansing - multiple wounds X- 1 5 Wound Imaging (photographs - any number of wounds) []  - 0 Wound Tracing (instead of photographs) []  - 0 Simple Wound Measurement - one wound []  - 0 Complex Wound Measurement - multiple wounds INTERVENTIONS - Wound Dressings X - Small Wound Dressing one or multiple wounds 1 10 []  - 0 Medium Wound Dressing one or multiple wounds []  - 0 Large Wound Dressing one or multiple wounds []  - 0 Application of Medications - topical []  - 0 Application of Medications - injection INTERVENTIONS - Miscellaneous []  - 0 External ear exam []  - 0 Specimen Collection (cultures, biopsies, blood, body fluids, etc.) []  - 0 Specimen(s) / Culture(s) sent or taken to Lab for analysis []  - 0 Patient Transfer (multiple staff / [employer] fast streamer / Similar devices) []  - 0 Simple Staple / Suture removal (25 or less) []  - 0 Complex Staple / Suture removal (26 or more) []  - 0 Hypo / Hyperglycemic Management (close monitor of Blood Glucose) Jocelyn Camacho, Jocelyn Camacho ([id number]) [id number] Page 3 of 7 []  - 0 Ankle / Brachial Index (ABI) - do not check if billed separately X- 1 5 Vital Signs Has the patient been seen at the hospital within the last three years: Yes Total Score: 90 Level Of Care: New/Established - Level 3 Electronic Signature(s) Signed: 11/12/2022 3:43:48 PM By: Baruch Gouty RN, BSN Entered By: Baruch Gouty on 11/12/2022 13:59:26 -------------------------------------------------------------------------------- Encounter Discharge Information Details Patient Name: Date of Service: Jocelyn Camacho. 11/12/2022 1:15 PM Medical Record Number:  [id number] Patient Account Number: [id number] Date of Birth/Sex: Treating RN: 1946/10/04 (76 y.o. [name] Primary Care [place]: [name] Other Clinician: Referring Bjorn Hallas: Treating Winfred Redel/Extender: Collie Siad in Treatment: 1 Encounter Discharge Information Items Discharge Condition: Stable Ambulatory Status: Cane Discharge Destination: Home Transportation: Private Auto Accompanied By: self Schedule Follow-up Appointment: Yes Clinical Summary of Care: Patient Declined Electronic Signature(s) Signed: 11/12/2022 3:43:48 PM By: Baruch Gouty RN, BSN Entered By: Baruch Gouty on 11/12/2022 14:00:05 -------------------------------------------------------------------------------- Lower Extremity Assessment Details Patient Name: Date of Service: Jocelyn Camacho, Jocelyn Camacho 11/12/2022 1:15 PM Medical Record Number: [id number] Patient Account Number: [id number] Date of Birth/Sex: Treating RN: 1946/02/21 (76 y.o. [name] Primary Care Troi Bechtold: [name] Other Clinician: Referring Kristyna Bradstreet: Treating Aniya Jolicoeur/Extender: Marily Lente Weeks in Treatment: 1 Edema Assessment Assessed: [Left: No] [Right: No] Edema: [Left: Ye] [Right: s] Calf Left: Right: Point of Measurement: From Medial Instep 37 cm Ankle Left: Right: Point of Measurement: From Medial Instep 23.5 cm Vascular Assessment Jocelyn Camacho, Jocelyn Camacho ([id number]) [Right:[id number] Page 4 of 7] Pulses: Dorsalis Pedis Palpable: [Left:Yes] Electronic Signature(s) Signed: 11/12/2022 3:43:48 PM By: Baruch Gouty RN, BSN Entered By: Baruch Gouty on 11/12/2022 13:47:33 -------------------------------------------------------------------------------- Multi Wound Chart Details Patient Name: Date of Service: Jocelyn Camacho. 11/12/2022 1:15 PM Medical Record Number: [id number] Patient Account Number: [id number] Date of  Birth/Sex: Treating RN: [birth year] (76 y.o. F) Primary Care Marnita Poirier: [name] Other Clinician: Referring Donterius Filley: Treating Brihana Quickel/Extender: Collie Siad in Treatment: 1 Vital Signs Height(in): 63 Capillary Blood Glucose(mg/dl): 91 Weight(lbs): 221 Pulse(bpm): 92 Body Mass Index(BMI): 39.1 Blood Pressure(mmHg): 118/76 Temperature(F): 97.9 Respiratory Rate(breaths/min): 20 [3:Photos: No Photos Left, Lateral Lower Leg Wound Location: Blister Wounding Event: Venous Leg Ulcer Primary Etiology: Diabetic Wound/Ulcer of the Lower Secondary Etiology: Extremity Cataracts, Glaucoma, Sleep Apnea, Comorbid History:  Measurement X - Simple Wound Cleansing - one wound 1 5 []  - 0 Complex Wound Cleansing - multiple wounds X- 1 5 Wound Imaging (photographs - any number of wounds) []  - 0 Wound Tracing (instead of photographs) []  - 0 Simple Wound Measurement - one wound []  - 0 Complex Wound Measurement - multiple wounds INTERVENTIONS - Wound Dressings X - Small Wound Dressing one or multiple wounds 1 10 []  - 0 Medium Wound Dressing one or multiple wounds []  - 0 Large Wound Dressing one or multiple wounds []  - 0 Application of Medications - topical []  - 0 Application of Medications - injection INTERVENTIONS - Miscellaneous []  - 0 External ear exam []  - 0 Specimen Collection (cultures, biopsies, blood, body fluids, etc.) []  - 0 Specimen(s) / Culture(s) sent or taken to Lab for analysis []  - 0 Patient Transfer (multiple staff / [employer] fast streamer / Similar devices) []  - 0 Simple Staple / Suture removal (25 or less) []  - 0 Complex Staple / Suture removal (26 or more) []  - 0 Hypo / Hyperglycemic Management (close monitor of Blood Glucose) Jocelyn Camacho, Jocelyn Camacho ([id number]) [id number] Page 3 of 7 []  - 0 Ankle / Brachial Index (ABI) - do not check if billed separately X- 1 5 Vital Signs Has the patient been seen at the hospital within the last three years: Yes Total Score: 90 Level Of Care: New/Established - Level 3 Electronic Signature(s) Signed: 11/12/2022 3:43:48 PM By: Baruch Gouty RN, BSN Entered By: Baruch Gouty on 11/12/2022 13:59:26 -------------------------------------------------------------------------------- Encounter Discharge Information Details Patient Name: Date of Service: Jocelyn Camacho. 11/12/2022 1:15 PM Medical Record Number:  [id number] Patient Account Number: [id number] Date of Birth/Sex: Treating RN: 1946/10/04 (76 y.o. [name] Primary Care [place]: [name] Other Clinician: Referring Bjorn Hallas: Treating Winfred Redel/Extender: Collie Siad in Treatment: 1 Encounter Discharge Information Items Discharge Condition: Stable Ambulatory Status: Cane Discharge Destination: Home Transportation: Private Auto Accompanied By: self Schedule Follow-up Appointment: Yes Clinical Summary of Care: Patient Declined Electronic Signature(s) Signed: 11/12/2022 3:43:48 PM By: Baruch Gouty RN, BSN Entered By: Baruch Gouty on 11/12/2022 14:00:05 -------------------------------------------------------------------------------- Lower Extremity Assessment Details Patient Name: Date of Service: Jocelyn Camacho, Jocelyn Camacho 11/12/2022 1:15 PM Medical Record Number: [id number] Patient Account Number: [id number] Date of Birth/Sex: Treating RN: 1946/02/21 (76 y.o. [name] Primary Care Troi Bechtold: [name] Other Clinician: Referring Kristyna Bradstreet: Treating Aniya Jolicoeur/Extender: Marily Lente Weeks in Treatment: 1 Edema Assessment Assessed: [Left: No] [Right: No] Edema: [Left: Ye] [Right: s] Calf Left: Right: Point of Measurement: From Medial Instep 37 cm Ankle Left: Right: Point of Measurement: From Medial Instep 23.5 cm Vascular Assessment Jocelyn Camacho, Jocelyn Camacho ([id number]) [Right:[id number] Page 4 of 7] Pulses: Dorsalis Pedis Palpable: [Left:Yes] Electronic Signature(s) Signed: 11/12/2022 3:43:48 PM By: Baruch Gouty RN, BSN Entered By: Baruch Gouty on 11/12/2022 13:47:33 -------------------------------------------------------------------------------- Multi Wound Chart Details Patient Name: Date of Service: Jocelyn Camacho. 11/12/2022 1:15 PM Medical Record Number: [id number] Patient Account Number: [id number] Date of  Birth/Sex: Treating RN: [birth year] (76 y.o. F) Primary Care Marnita Poirier: [name] Other Clinician: Referring Donterius Filley: Treating Brihana Quickel/Extender: Collie Siad in Treatment: 1 Vital Signs Height(in): 63 Capillary Blood Glucose(mg/dl): 91 Weight(lbs): 221 Pulse(bpm): 92 Body Mass Index(BMI): 39.1 Blood Pressure(mmHg): 118/76 Temperature(F): 97.9 Respiratory Rate(breaths/min): 20 [3:Photos: No Photos Left, Lateral Lower Leg Wound Location: Blister Wounding Event: Venous Leg Ulcer Primary Etiology: Diabetic Wound/Ulcer of the Lower Secondary Etiology: Extremity Cataracts, Glaucoma, Sleep Apnea, Comorbid History:  Measurement X - Simple Wound Cleansing - one wound 1 5 []  - 0 Complex Wound Cleansing - multiple wounds X- 1 5 Wound Imaging (photographs - any number of wounds) []  - 0 Wound Tracing (instead of photographs) []  - 0 Simple Wound Measurement - one wound []  - 0 Complex Wound Measurement - multiple wounds INTERVENTIONS - Wound Dressings X - Small Wound Dressing one or multiple wounds 1 10 []  - 0 Medium Wound Dressing one or multiple wounds []  - 0 Large Wound Dressing one or multiple wounds []  - 0 Application of Medications - topical []  - 0 Application of Medications - injection INTERVENTIONS - Miscellaneous []  - 0 External ear exam []  - 0 Specimen Collection (cultures, biopsies, blood, body fluids, etc.) []  - 0 Specimen(s) / Culture(s) sent or taken to Lab for analysis []  - 0 Patient Transfer (multiple staff / [employer] fast streamer / Similar devices) []  - 0 Simple Staple / Suture removal (25 or less) []  - 0 Complex Staple / Suture removal (26 or more) []  - 0 Hypo / Hyperglycemic Management (close monitor of Blood Glucose) Jocelyn Camacho, Jocelyn Camacho ([id number]) [id number] Page 3 of 7 []  - 0 Ankle / Brachial Index (ABI) - do not check if billed separately X- 1 5 Vital Signs Has the patient been seen at the hospital within the last three years: Yes Total Score: 90 Level Of Care: New/Established - Level 3 Electronic Signature(s) Signed: 11/12/2022 3:43:48 PM By: Baruch Gouty RN, BSN Entered By: Baruch Gouty on 11/12/2022 13:59:26 -------------------------------------------------------------------------------- Encounter Discharge Information Details Patient Name: Date of Service: Jocelyn Camacho. 11/12/2022 1:15 PM Medical Record Number:  [id number] Patient Account Number: [id number] Date of Birth/Sex: Treating RN: 1946/10/04 (76 y.o. [name] Primary Care [place]: [name] Other Clinician: Referring Bjorn Hallas: Treating Winfred Redel/Extender: Collie Siad in Treatment: 1 Encounter Discharge Information Items Discharge Condition: Stable Ambulatory Status: Cane Discharge Destination: Home Transportation: Private Auto Accompanied By: self Schedule Follow-up Appointment: Yes Clinical Summary of Care: Patient Declined Electronic Signature(s) Signed: 11/12/2022 3:43:48 PM By: Baruch Gouty RN, BSN Entered By: Baruch Gouty on 11/12/2022 14:00:05 -------------------------------------------------------------------------------- Lower Extremity Assessment Details Patient Name: Date of Service: Jocelyn Camacho, Jocelyn Camacho 11/12/2022 1:15 PM Medical Record Number: [id number] Patient Account Number: [id number] Date of Birth/Sex: Treating RN: 1946/02/21 (76 y.o. [name] Primary Care Troi Bechtold: [name] Other Clinician: Referring Kristyna Bradstreet: Treating Aniya Jolicoeur/Extender: Marily Lente Weeks in Treatment: 1 Edema Assessment Assessed: [Left: No] [Right: No] Edema: [Left: Ye] [Right: s] Calf Left: Right: Point of Measurement: From Medial Instep 37 cm Ankle Left: Right: Point of Measurement: From Medial Instep 23.5 cm Vascular Assessment Jocelyn Camacho, Jocelyn Camacho ([id number]) [Right:[id number] Page 4 of 7] Pulses: Dorsalis Pedis Palpable: [Left:Yes] Electronic Signature(s) Signed: 11/12/2022 3:43:48 PM By: Baruch Gouty RN, BSN Entered By: Baruch Gouty on 11/12/2022 13:47:33 -------------------------------------------------------------------------------- Multi Wound Chart Details Patient Name: Date of Service: Jocelyn Camacho. 11/12/2022 1:15 PM Medical Record Number: [id number] Patient Account Number: [id number] Date of  Birth/Sex: Treating RN: [birth year] (76 y.o. F) Primary Care Marnita Poirier: [name] Other Clinician: Referring Donterius Filley: Treating Brihana Quickel/Extender: Collie Siad in Treatment: 1 Vital Signs Height(in): 63 Capillary Blood Glucose(mg/dl): 91 Weight(lbs): 221 Pulse(bpm): 92 Body Mass Index(BMI): 39.1 Blood Pressure(mmHg): 118/76 Temperature(F): 97.9 Respiratory Rate(breaths/min): 20 [3:Photos: No Photos Left, Lateral Lower Leg Wound Location: Blister Wounding Event: Venous Leg Ulcer Primary Etiology: Diabetic Wound/Ulcer of the Lower Secondary Etiology: Extremity Cataracts, Glaucoma, Sleep Apnea, Comorbid History:

## 2022-11-13 NOTE — Progress Notes (Signed)
Jocelyn Camacho, Jocelyn Camacho ([id number]) [id number] Page 1 of 8 Visit Report for 11/12/2022 Chief Complaint Document Details Patient Name: Date of Service: Jocelyn Camacho, Jocelyn Camacho 11/12/2022 1:15 PM Medical Record Number: [id number] Patient Account Number: [id number] Date of Birth/Sex: Treating RN: 1946-05-23 (76 y.o. F) Primary Care Provider: [name] Other Clinician: Referring Provider: Treating Provider/Extender: Collie Siad in Treatment: 1 Information Obtained from: Patient Chief Complaint Patient presents for treatment of open ulcers due to venous insufficiency Electronic Signature(s) Signed: 11/12/2022 1:59:16 PM By: [name] MD FACS Entered By: [name] on 11/12/2022 13:59:16 -------------------------------------------------------------------------------- HPI Details Patient Name: Date of Service: Jocelyn Camacho. 11/12/2022 1:15 PM Medical Record Number: [id number] Patient Account Number: [id number] Date of Birth/Sex: Treating RN: 10-24-1946 (76 y.o. 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 She says that she has had trouble with her left leg for about the past 6 months. She says that wounds will open and then she will get them closed only to have them opened up again. The current episode began in March. She saw her primary care provider on March 14. She was prescribed a 2-week course of antibiotics and was advised to apply Betadine sugar to the wounds. She was seen again yesterday and the doxycycline extended due to some  periwound erythema. Jocelyn Camacho has worn compression stockings in the past but they are now at least a year old. She is not certain what degree of compression they provided. ABI in clinic today was 1.17. She does not smoke. 04/15/2022: The proximal wound is closed. The distal wound is much smaller and has a very clean surface. Her venous reflux studies are scheduled for later this week. 04/22/2022: Her wounds are closed today. The results of her reflux studies done last week are copied here: Summary: Right: - No evidence of deep vein thrombosis seen in the right lower extremity, from the common femoral through the popliteal veins. - No evidence of superficial venous thrombosis in the right lower extremity. - Venous reflux is noted in the right greater saphenous vein in the calf. Left: - No evidence of deep vein thrombosis seen in the left lower extremity, from the common femoral through the popliteal veins. - No evidence of superficial venous thrombosis in the left lower extremity. - Venous reflux is noted in the left sapheno-femoral junction. - Venous reflux is noted in the left greater saphenous vein in the thigh. - Venous reflux is noted in the left short saphenous vein. She has her compression stockings with her. READMISSION 10/31/2022 Jocelyn Camacho, Jocelyn Camacho ([id number]) [id number] Page 2 of 8 The patient returns with a reopening of an ulcer in the same location as prior. She said it has been open for about a week. She has been applying Betadine sugar slurry to the site. She reports that she had been wearing her compression stockings and developed the ulcer despite this. Of note, after her reflux studies, she was seen by Dr. Scot Dock who said in his note that should she develop an ulcer again in the future, she would benefit from saphenous vein ablation. ABI 1.17. On her lateral left lower leg, there is an oval ulcer with some slough accumulation  on the surface. It is  limited to the fat layer. There is also a blister just distal to this. No concern for infection. 11/07/2022: The wound is smaller this week. It is more superficial. It is quite clean with just a little biofilm and some periwound eschar. She did meet with Dr. Scot Dock and they are planning a saphenous vein ablation in the future. Edema control is good. 11/12/2022: Her wound is healed. Electronic Signature(s) Signed: 11/12/2022 1:59:33 PM By: [name] MD FACS Entered By: [name] on 11/12/2022 13:59:32 -------------------------------------------------------------------------------- Physical Exam Details Patient Name: Date of Service: Jocelyn Camacho, Jocelyn Camacho 11/12/2022 1:15 PM Medical Record Number: [id number] Patient Account Number: [id number] Date of Birth/Sex: Treating RN: Feb 27, 1946 (76 y.o. F) Primary Care Provider: [name] Other Clinician: Referring Provider: Treating Provider/Extender: Jocelyn Camacho in Treatment: 1 Constitutional . . . . No acute distress. Respiratory Normal work of breathing on room air. Notes 11/12/2022: Her wound is healed. Electronic Signature(s) Signed: 11/12/2022 2:00:02 PM By: [name] MD FACS Entered By: [name] on 11/12/2022 14:00:02 -------------------------------------------------------------------------------- Physician Orders Details Patient Name: Date of Service: Jocelyn Camacho, Jocelyn Camacho 11/12/2022 1:15 PM Medical Record Number: [id number] Patient Account Number: [id number] Date of Birth/Sex: Treating RN: 11-08-1946 (76 y.o. Martyn Camacho, Jocelyn Primary Care Provider: [name] Other Clinician: Referring Provider: Treating Provider/Extender: Collie Siad in Treatment: 1 Verbal / Phone Orders: No Diagnosis Coding ICD-10 Coding Code Description [phone] Non-pressure chronic ulcer of other part of left lower leg with fat layer exposed N18.31 Chronic kidney  disease, stage 3a I87.2 Venous insufficiency (chronic) (peripheral) E11.22 Type 2 diabetes mellitus with diabetic chronic kidney disease E11.622 Type 2 diabetes mellitus with other skin ulcer Jocelyn Camacho, Jocelyn Camacho ([id number]) [id number] Page 3 of 8 E66.01 Morbid (severe) obesity due to excess calories G47.33 Obstructive sleep apnea (adult) (pediatric) I10 Essential (primary) hypertension Discharge From [facility] Services Discharge from [facility] Bathing/ Shower/ Hygiene May shower and wash wound with soap and water. Edema Control - Lymphedema / SCD / Other Bilateral Lower Extremities Elevate legs to the level of the heart or above for 30 minutes daily and/or when sitting, a frequency of: - whenever sitting throughout the day Avoid standing for long periods of time. Exercise regularly Moisturize legs daily. - nightly after removing stockings Compression stocking or Garment 20-30 mm/Hg pressure to: - both legs daily Electronic Signature(s) Signed: 11/12/2022 2:00:17 PM By: [name] MD FACS Entered By: [name] on 11/12/2022 14:00:16 -------------------------------------------------------------------------------- Problem List Details Patient Name: Date of Service: Jocelyn Camacho. 11/12/2022 1:15 PM Medical Record Number: [id number] Patient Account Number: [id number] Date of Birth/Sex: Treating RN: [birth year] (76 y.o. Martyn Camacho, Vaughan Basta Primary Care Provider: [name] Other Clinician: Referring Provider: Treating Provider/Extender: Collie Siad in Treatment: 1 Active Problems ICD-10 Encounter Code Description Active Date MDM Diagnosis [phone] Non-pressure chronic ulcer of other part of left lower leg with fat layer 10/31/2022 No Yes exposed N18.31 Chronic kidney disease, stage 3a 10/31/2022 No Yes I87.2 Venous insufficiency (chronic) (peripheral) 10/31/2022 No Yes E11.22 Type 2 diabetes mellitus with  diabetic chronic kidney disease 10/31/2022 No Yes E11.622 Type 2 diabetes mellitus with other skin ulcer 10/31/2022 No Yes E66.01 Morbid (severe) obesity due to excess calories 10/31/2022 No Yes G47.33 Obstructive sleep apnea (adult) (pediatric) 10/31/2022 No Yes I10 Essential (primary) hypertension 10/31/2022 No Yes KORAIMA, ALBERTSEN ([id number]) [id number] Page 4 of 8 Inactive Problems Resolved Problems Electronic Signature(s)  Signed: 11/12/2022 1:58:55 PM By: [name] MD FACS Entered By: [name] on 11/12/2022 13:58:55 -------------------------------------------------------------------------------- Progress Note Details Patient Name: Date of Service: Jocelyn Camacho, Jocelyn Camacho 11/12/2022 1:15 PM Medical Record Number: [id number] Patient Account Number: [id number] Date of Birth/Sex: Treating RN: 08-04-1946 (76 y.o. 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 She says that she has had trouble with her left leg for about the past 6 months. She says that wounds will open and then she will get them closed only to have them opened up again. The current episode began in March. She saw her primary care provider on March 14. She was prescribed a 2-week course of antibiotics and was advised to apply Betadine sugar to the wounds. She was seen again yesterday and the doxycycline extended due to some periwound erythema. Ms. Haun has worn compression stockings  in the past but they are now at least a year old. She is not certain what degree of compression they provided. ABI in clinic today was 1.17. She does not smoke. 04/15/2022: The proximal wound is closed. The distal wound is much smaller and has a very clean surface. Her venous reflux studies are scheduled for later this week. 04/22/2022: Her wounds are closed today. The results of her reflux studies done last week are copied here: Summary: Right: - No evidence of deep vein thrombosis seen in the right lower extremity, from the common femoral through the popliteal veins. - No evidence of superficial venous thrombosis in the right lower extremity. - Venous reflux is noted in the right greater saphenous vein in the calf. Left: - No evidence of deep vein thrombosis seen in the left lower extremity, from the common femoral through the popliteal veins. - No evidence of superficial venous thrombosis in the left lower extremity. - Venous reflux is noted in the left sapheno-femoral junction. - Venous reflux is noted in the left greater saphenous vein in the thigh. - Venous reflux is noted in the left short saphenous vein. She has her compression stockings with her. READMISSION 10/31/2022 The patient returns with a reopening of an ulcer in the same location as prior. She said it has been open for about a week. She has been applying Betadine sugar slurry to the site. She reports that she had been wearing her compression stockings and developed the ulcer despite this. Of note, after her reflux studies, she was seen by Dr. Scot Dock who said in his note that should she develop an ulcer again in the future, she would benefit from saphenous vein ablation. ABI 1.17. On her lateral left lower leg, there is an oval ulcer with some slough accumulation on the surface. It is limited to the fat layer. There is also a blister just distal to this. No concern for infection. 11/07/2022: The wound is smaller this week. It  is more superficial. It is quite clean with just a little biofilm and some periwound eschar. She did meet with Dr. Scot Dock and they are planning a saphenous vein ablation in the future. Edema control is good. 11/12/2022: Her wound is healed. Patient History Information  obtained from Patient. Jocelyn Camacho, Jocelyn Camacho ([id number]) [id number] Page 5 of 8 Family History Unknown History. Social History Never smoker, Marital Status - Married, Alcohol Use - Rarely, Drug Use - No History, Caffeine Use - Rarely. Medical History Eyes Patient has history of Cataracts - removed - both eyes, Glaucoma Respiratory Patient has history of Sleep Apnea Cardiovascular Patient has history of Hypertension, Peripheral Venous Disease Endocrine Patient has history of Type II Diabetes Denies history of Type I Diabetes Genitourinary Denies history of End Stage Renal Disease Integumentary (Skin) Denies history of History of Burn Musculoskeletal Patient has history of Gout, Osteoarthritis Psychiatric Denies history of Anorexia/bulimia, Confinement Anxiety Hospitalization/Surgery History - exploratory laparotomy. - bowel resection. - umbilical hernia repair. - tubal ligation. Medical A Surgical History Notes nd Constitutional Symptoms (General Health) morbid obesity Eyes diabetic retinopathy, macular degeneration Cardiovascular Hyperlipidemia Endocrine hyperparathyroidism Genitourinary CKD stage 3 Integumentary (Skin) Vitiligo Objective Constitutional No acute distress. Vitals Time Taken: 12:50 PM, Height: 63 in, Weight: 221 lbs, BMI: 39.1, Temperature: 97.9 F, Pulse: 92 bpm, Respiratory Rate: 20 breaths/min, Blood Pressure: 118/76 mmHg, Capillary Blood Glucose: 91 mg/dl. General Notes: glucose per pt report this am Respiratory Normal work of breathing on room air. General Notes: 11/12/2022: Her wound is healed. Integumentary (Hair, Skin) Wound #3 status is Open. Original  cause of wound was Blister. The date acquired was: 10/24/2022. The wound has been in treatment 1 Camacho. The wound is located on the Left,Lateral Lower Leg. The wound measures 0cm length x 0cm width x 0cm depth; 0cm^2 area and 0cm^3 volume. There is no tunneling or undermining noted. There is a none present amount of drainage noted. There is no granulation within the wound bed. There is no necrotic tissue within the wound bed. The periwound skin appearance had no abnormalities noted for moisture. The periwound skin appearance exhibited: Scarring, Hemosiderin Staining. Periwound temperature was noted as No Abnormality. Assessment Active Problems ICD-10 Non-pressure chronic ulcer of other part of left lower leg with fat layer exposed Chronic kidney disease, stage 3a Venous insufficiency (chronic) (peripheral) Type 2 diabetes mellitus with diabetic chronic kidney disease Type 2 diabetes mellitus with other skin ulcer Morbid (severe) obesity due to excess calories Obstructive sleep apnea (adult) (pediatric) Essential (primary) hypertension Jocelyn Camacho, Jocelyn Camacho ([id number]) [id number] Page 6 of 8 Plan Discharge From [facility] Services: Discharge from [facility] Bathing/ Shower/ Hygiene: May shower and wash wound with soap and water. Edema Control - Lymphedema / SCD / Other: Elevate legs to the level of the heart or above for 30 minutes daily and/or when sitting, a frequency of: - whenever sitting throughout the day Avoid standing for long periods of time. Exercise regularly Moisturize legs daily. - nightly after removing stockings Compression stocking or Garment 20-30 mm/Hg pressure to: - both legs daily 11/12/2022: Her wound is healed. She brought her compression stocking with her today so we will help her apply that. She was reminded of the importance of wearing them religiously on a daily basis. She was also instructed to moisturize the skin of her legs liberally and  elevate her legs is much as possible throughout the day and particularly at night while she is sleeping. We will discharge her from the wound care center. She may follow-up as needed. Electronic Signature(s) Signed: 11/12/2022 2:03:05 PM By: [name] MD FACS Entered By: [name] on 11/12/2022 14:03:05 -------------------------------------------------------------------------------- HxROS Details Patient Name: Date of Service: Jocelyn Camacho, Jocelyn Camacho 11/12/2022 1:15 PM Medical Record Number: [id number] Patient Account Number: [id number] Date  of Birth/Sex: Treating RN: 1946/08/10 (76 y.o. F) Primary Care Provider: [name] Other Clinician: Referring Provider: Treating Provider/Extender: Collie Siad in Treatment: 1 Information Obtained From Patient Constitutional Symptoms (General Health) Medical History: Past Medical History Notes: morbid obesity Eyes Medical History: Positive for: Cataracts - removed - both eyes; Glaucoma Past Medical History Notes: diabetic retinopathy, macular degeneration Respiratory Medical History: Positive for: Sleep Apnea Cardiovascular Medical History: Positive for: Hypertension; Peripheral Venous Disease Past Medical History Notes: Hyperlipidemia Endocrine Medical History: Positive for: Type II Diabetes Negative for: Type I Diabetes Jocelyn Camacho, Jocelyn Camacho ([id number]) [id number] Page 7 of 8 Past Medical History Notes: hyperparathyroidism Time with diabetes: 3 years Treated with: Insulin Blood sugar tested every day: Yes Tested : twice a day Genitourinary Medical History: Negative for: End Stage Renal Disease Past Medical History Notes: CKD stage 3 Integumentary (Skin) Medical History: Negative for: History of Burn Past Medical History Notes: Vitiligo Musculoskeletal Medical History: Positive for: Gout; Osteoarthritis Psychiatric Medical History: Negative for:  Anorexia/bulimia; Confinement Anxiety HBO Extended History Items Eyes: Eyes: Cataracts Glaucoma Immunizations Pneumococcal Vaccine: Received Pneumococcal Vaccination: Yes Received Pneumococcal Vaccination On or After 60th Birthday: Yes Implantable Devices None Hospitalization / Surgery History Type of Hospitalization/Surgery exploratory laparotomy bowel resection umbilical hernia repair tubal ligation Family and Social History Unknown History: Yes; Never smoker; Marital Status - Married; Alcohol Use: Rarely; Drug Use: No History; Caffeine Use: Rarely; Financial Concerns: No; Food, Clothing or Shelter Needs: No; Support System Lacking: No; Transportation Concerns: No Electronic Signature(s) Signed: 11/12/2022 5:15:30 PM By: [name] MD FACS Entered By: [name] on 11/12/2022 13:59:38 -------------------------------------------------------------------------------- SuperBill Details Patient Name: Date of Service: Jocelyn Camacho 11/12/2022 Medical Record Number: [id number] Patient Account Number: [id number] Date of Birth/Sex: Treating RN: November 24, 1946 (76 y.o. [name] Primary Care Provider: [name] Other Clinician: Referring Provider: Treating Provider/Extender: Collie Siad in Treatment: 1 MARGIT, BATTE ([id number]) [id number] Page 8 of 8 Diagnosis Coding ICD-10 Codes Code Description [phone] Non-pressure chronic ulcer of other part of left lower leg with fat layer exposed N18.31 Chronic kidney disease, stage 3a I87.2 Venous insufficiency (chronic) (peripheral) E11.22 Type 2 diabetes mellitus with diabetic chronic kidney disease E11.622 Type 2 diabetes mellitus with other skin ulcer E66.01 Morbid (severe) obesity due to excess calories G47.33 Obstructive sleep apnea (adult) (pediatric) I10 Essential (primary) hypertension Facility Procedures : CPT4 Code: [id number] Description:  99213 - WOUND CARE VISIT-LEV 3 EST PT Modifier: Quantity: 1 Physician Procedures : CPT4 Code Description Modifier [id number] - WC PHYS LEVEL 2 - EST PT ICD-10 Diagnosis Description 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d I87.2 Venous insufficiency (chronic) (peripheral) E11.622  Type 2 diabetes mellitus with other skin ulcer N18.31 Chronic kidney disease, stage 3a Quantity: 1 Electronic Signature(s) Signed: 11/12/2022 2:22:28 PM By: [name] MD FACS Entered By: [name] on 11/12/2022 14:22:28

## 2022-11-18 ENCOUNTER — Encounter: Payer: Self-pay | Admitting: Family Medicine

## 2022-11-18 ENCOUNTER — Ambulatory Visit (INDEPENDENT_AMBULATORY_CARE_PROVIDER_SITE_OTHER): Payer: Medicare Other | Admitting: Family Medicine

## 2022-11-18 VITALS — BP 137/82 | HR 93 | Ht 62.0 in | Wt 226.0 lb

## 2022-11-18 DIAGNOSIS — G4733 Obstructive sleep apnea (adult) (pediatric): Secondary | ICD-10-CM

## 2022-11-18 NOTE — Progress Notes (Addendum)
Order for cpap supplies sent to [facility] via community message. Confirmation received that the order transmitted was successful.  

## 2022-11-18 NOTE — Patient Instructions (Signed)
Please continue using your CPAP regularly. While your insurance requires that you use CPAP at least 4 hours each night on 70% of the nights, I recommend, that you not skip any nights and use it throughout the night if you can. Getting used to CPAP and staying with the treatment long term does take time and patience and discipline. Untreated obstructive sleep apnea when it is moderate to severe can have an adverse impact on cardiovascular health and raise her risk for heart disease, arrhythmias, hypertension, congestive heart failure, stroke and diabetes. Untreated obstructive sleep apnea causes sleep disruption, nonrestorative sleep, and sleep deprivation. This can have an impact on your day to day functioning and cause daytime sleepiness and impairment of cognitive function, memory loss, mood disturbance, and problems focussing. Using CPAP regularly can improve these symptoms.   Follow up with me in 1 year

## 2022-11-18 NOTE — Progress Notes (Signed)
PATIENT: Jocelyn Camacho DOB: 1946/11/16  REASON FOR VISIT: follow up HISTORY FROM: patient  Chief Complaint  Patient presents with   Follow-up    RM 10, alone. Thinks the CPAP is not recording enough time. Doesn't feel much different with CPAP use.     HISTORY OF PRESENT ILLNESS:  11/18/22 ALL:  Jocelyn Camacho returns for follow up for OSA on CPAP. She is doing well. She is using her machine for about 4-5 hours. She is concerned that the machine may not be recording accurately. She has set alarms for 4 hours and the machine has recorded less time. She usually sleeps for about 5 hours. She is tolerating CPAP well. She is not certain she sleeps any better.     04/23/2022 ALL: Jocelyn Camacho is a 76 y.o. female here today for follow up for OSA on CPAP.  She was seen in consult with Dr Brett Fairy 10/24/2021 for sleep study to resume CPAP. She had previously used CPAP but been off therapy for about a year due to needing a new machine. PSG 12/03/2021 showed moderate/severe OSA with total AHI 27.4/hr with severe hypoxia with O2 nadir 83% , O2 below 89 for 64 minutes. CPAP titration study 01/15/2022 showed OSA treated well at [place] and hypoxia resolved. AutoPAP ordered. Since, she reports using CPAP most nights. She reports having long standing issue with insomnia. She is taking melatonin 10mg  daily. She hasn't been interested in adding more medications. She has not noticed much benefit of using CPAP. She is followed closely by PCP.     HISTORY: (copied from Dohmeier's previous note)  Jocelyn Camacho is a 76 y.o. year old 80 or [place] American female patient seen here as a referral on 10/24/2021 from Dr. Melford Aase for a new evaluation of OSA. Marland Kitchen  Chief concern according to patient :  I had a sleep study on [address], @[facility], many years ago- and need a new CPAP. I have not been using my machine for a year now , it wasn't working as well as I thought it should" I was last tested and treated over 10  years ago. ".    I have the pleasure of seeing Jocelyn Camacho today, a right-handed [place] or [place] American female with a history of OSA sleep disorder.  She has a past medical history of Allergic rhinitis, Allergy, Anxiety, Hyperlipidemia, Hypertension, Obesity, OSA (obstructive sleep apnea), Type II or unspecified type diabetes mellitus with renal manifestations, not stated as uncontrolled(250.40), Unspecified hypothyroidism, and Vitamin D deficiency..     Sleep relevant medical history: now not on CPAP : Nocturia 2 times , no Tonsillectomy, no cervical spine surgery, no deviated septum . Knee arthritis. DM on insulin.     Family medical /sleep history: no  other family member on CPAP with OSA,.    Social history:  Patient is retired from [employer] and lives in a household with spouse,  adult daughter lives nearby. No pets.Tobacco use/ .  ETOH use /,  Caffeine intake in form of Coffee( 1-2 a week) Soda( /) Tea ( /) or energy drinks. Regular exercise in form of -nothing, not a swimmer.    Sleep habits are as follows: The patient's dinner time is between 6 PM. The patient goes to sleep on a love seat in her den- not reclined- seated. Tv is on , lights on all night-  PM and continues to sleep for intervals of 2 -3 hours,  wakes for 2 bathroom breaks,  the first time at 3 AM.   The preferred sleep position is seated, with the support of one pillow.  Dreams are reportedly rare.  7  AM is the usual rise time. The patient wakes up spontaneously.  She reports not feeling unrefreshed or unrestored in AM, with symptoms such as dry mouth, morning headaches, and residual fatigue.  Naps are taken infrequently, lasting from 15 to 20 minutes and are refreshing .   REVIEW OF SYSTEMS: Out of a complete 14 system review of symptoms, the patient complains only of the following symptoms, insomnia, fatigue, and all other reviewed systems are negative.  ESS: 3/24   ALLERGIES: Allergies  Allergen Reactions    Penicillins Swelling   Sulfa Antibiotics Itching and Rash    HOME MEDICATIONS: Outpatient Medications Prior to Visit  Medication Sig Dispense Refill   acetaminophen (TYLENOL) 650 MG CR tablet Take 1,300 mg by mouth every 8 (eight) hours as needed for pain.     allopurinol (ZYLOPRIM) 300 MG tablet TAKE 1/2 TABLET(150 MG) BY MOUTH DAILY FOR GOUT PREVENTION (Patient taking differently: Take 150 mg by mouth daily. Gout prevention) 45 tablet 2   Ascorbic Acid (VITAMIN C) 1000 MG tablet Take 1,000 mg by mouth daily.     aspirin EC 81 MG tablet Take 81 mg by mouth daily.     atenolol (TENORMIN) 100 MG tablet Take 1 tablet by mouth once daily for blood pressure 90 tablet 0   Cholecalciferol (VITAMIN D3) 5000 units CAPS Take 5,000 Units by mouth daily.     colchicine 0.6 MG tablet Take  1 tablet  Daily  to Prevent Gout                                                                 /                         TAKE                     BY                     MOUTH (Patient taking differently: Take 0.6 mg by mouth daily. Prevent Gout) 90 tablet 3   Cyanocobalamin (B-12) 1000 MCG SUBL Place 1,000 mcg under the tongue 2 (two) times a week. Saturday and Sunday     dorzolamide-timolol (COSOPT) 22.3-6.8 MG/ML ophthalmic solution Place 1 drop into the left eye 2 (two) times daily.     Dulaglutide (TRULICITY) 3 JE/5.6DJ SOPN Inject  3 MG(0.5 ML) into the skin  weekly for Diabetes (Dx: e10.29) (Patient taking differently: Inject 3 mg as directed every Sunday. Diabetes) 6 mL 3   Finerenone (KERENDIA) 10 MG TABS Take 10 mg by mouth daily. Take  1 tablet  Daily  for Diabetic Kidneys (Patient taking differently: Take 10 mg by mouth daily. Diabetic Kidneys) 90 tablet 0   furosemide (LASIX) 80 MG tablet TAKE 1 TABLET BY MOUTH 1 TO 2 TIMES PER DAY AS NEEDED FOR FLUID RETENTION OR ANKLE SWELLING 180 tablet 3   hyoscyamine (LEVSIN, ANASPAZ) 0.125 MG tablet Take 1 tablet (0.125 mg total) by mouth every 6 (six) hours as needed.  (Patient taking differently: Take 0.125  mg by mouth every 6 (six) hours as needed for bladder spasms.) 60 tablet 1   insulin NPH-regular Human (70-30) 100 UNIT/ML injection Patient injects 25 units in the AM and 10 units in the PM. Reduce each insulin dose by 2 units if glucose persistently <100 (Patient taking differently: Inject 5-20 Units into the skin See admin instructions. Patient injects 20 units in the AM and 5 units in the PM. Reduce each insulin dose by 2 units if glucose persistently <100) 10 mL    Lancets (ONETOUCH ULTRASOFT) lancets Check blood sugar 3 times daily-DX-E10.9 300 each 1   levothyroxine (SYNTHROID) 125 MCG tablet Take  1 tablet  Daily  on an empty stomach with only water for 30 minutes & no Antacid meds, Calcium or Magnesium for 4 hours & avoid Biotin (Patient taking differently: Take 125 mcg by mouth daily before breakfast. Take on an empty stomach with only water for 30 minutes & no Antacid meds, Calcium or Magnesium for 4 hours & avoid Biotin) 90 tablet 3   nystatin cream (MYCOSTATIN) Apply 1 Application topically 2 (two) times daily. 30 g 2   ondansetron (ZOFRAN-ODT) 4 MG disintegrating tablet Take 1 tablet (4 mg total) by mouth every 6 (six) hours as needed for nausea. 20 tablet 0   ONETOUCH ULTRA test strip CHECK BLOOD SUGAR 3 TIMES A DAY 300 strip 3   polymyxin B 500,000 Units in sodium chloride irrigation 0.9 % 500 mL Irrigate with 1 Application as directed every 30 (thirty) days. Eye Injection     rosuvastatin (CRESTOR) 5 MG tablet Take 1 tablet once weekly as directed for Cholesterol LDL goal less than 70. 13 tablet 3   traMADol (ULTRAM) 50 MG tablet Take 1 tablet (50 mg total) by mouth every 6 (six) hours as needed for moderate pain. 15 tablet 0   zinc gluconate 50 MG tablet Take 50 mg by mouth daily.     No facility-administered medications prior to visit.    PAST MEDICAL HISTORY: Past Medical History:  Diagnosis Date   Allergic rhinitis    Allergy    Anxiety     with medical procedures   Hyperlipidemia    Hypertension    Obesity    OSA (obstructive sleep apnea)    wears CPAP   Type II or unspecified type diabetes mellitus with renal manifestations, not stated as uncontrolled(250.40)    Unspecified hypothyroidism    Vitamin D deficiency     PAST SURGICAL HISTORY: Past Surgical History:  Procedure Laterality Date    eye procedure     have treatment where a needle is stuck into left eye- since June 2014; now every six weeks   BOWEL RESECTION N/A 07/24/2022   Procedure: SMALL BOWEL RESECTION;  Surgeon: Ralene Ok, MD;  Location: [facility];  Service: General;  Laterality: N/A;   DENTAL EXAMINATION UNDER ANESTHESIA     LAPAROSCOPY N/A 07/24/2022   Procedure: LAPAROSCOPY DIAGNOSTIC;  Surgeon: Ralene Ok, MD;  Location: [facility];  Service: General;  Laterality: N/A;   LAPAROTOMY N/A 07/24/2022   Procedure: EXPLORATORY LAPAROTOMY;  Surgeon: Ralene Ok, MD;  Location: [facility];  Service: General;  Laterality: N/A;   TUBAL LIGATION     UMBILICAL HERNIA REPAIR N/A 07/24/2022   Procedure: PRIMARY UMBILICAL HERNIA REPAIR;  Surgeon: Ralene Ok, MD;  Location[facility];  Service: General;  Laterality: N/A;    FAMILY HISTORY: Family History  Problem Relation Age of Onset   Breast cancer Mother  Renal Disease Mother    Hypertension Sister    Diabetes Sister    Diabetes Maternal Grandmother    Colon cancer Neg Hx    Esophageal cancer Neg Hx    Stomach cancer Neg Hx    Rectal cancer Neg Hx     SOCIAL HISTORY: Social History   Socioeconomic History   Marital status: Married    Spouse name: Charles   Number of children: 1   Years of education: Not on file   Highest education level: Some college, no degree  Occupational History   Occupation: Retired  Tobacco Use   Smoking status: Never   Smokeless tobacco: Never  Vaping Use   Vaping Use: Never used  Substance and Sexual Activity   Alcohol use: Not Currently    Comment: very  rarely   Drug use: No   Sexual activity: Not Currently    Partners: Male    Birth control/protection: Post-menopausal  Other Topics Concern   Not on file  Social History Narrative   Lives with husband    Right handed   Caffeine: 1 cup of coffee every 2 weeks   Social Determinants of Health   Financial Resource Strain: Not on file  Food Insecurity: Not on file  Transportation Needs: Not on file  Physical Activity: Inactive (06/14/2018)   Exercise Vital Sign    Days of Exercise per Week: 0 days    Minutes of Exercise per Session: 0 min  Stress: No Stress Concern Present (06/14/2018)   [place]    Feeling of Stress : Only a little  Social Connections: Not on file  Intimate Partner Violence: Not on file     PHYSICAL EXAM  Vitals:   11/18/22 1116  BP: 137/82  Pulse: 93  Weight: 226 lb (102.5 kg)  Height: 5\' 2"  (1.575 m)    Body mass index is 41.34 kg/m.  Generalized: Well developed, in no acute distress  Cardiology: normal rate and rhythm, no murmur noted Respiratory: clear to auscultation bilaterally  Neurological examination  Mentation: Alert oriented to time, place, history taking. Follows all commands speech and language fluent Cranial nerve II-XII: Pupils were equal round reactive to light. Extraocular movements were full, visual field were full  Motor: The motor testing reveals 5 over 5 strength of all 4 extremities. Good symmetric motor tone is noted throughout.  Gait and station: Gait is wide, stable with cane.     DIAGNOSTIC DATA (LABS, IMAGING, TESTING) - I reviewed patient records, labs, notes, testing and imaging myself where available.      No data to display           Lab Results  Component Value Date   WBC 7.1 09/25/2022   HGB 10.5 (L) 09/25/2022   HCT 32.6 (L) 09/25/2022   MCV 91.1 09/25/2022   PLT 369 09/25/2022      Component Value Date/Time   NA 144 09/25/2022 0000    K 4.2 09/25/2022 0000   CL 102 09/25/2022 0000   CO2 30 09/25/2022 0000   GLUCOSE 106 (H) 09/25/2022 0000   BUN 27 (H) 09/25/2022 0000   CREATININE 1.40 (H) 09/25/2022 0000   CALCIUM 9.5 09/25/2022 0000   PROT 7.6 09/25/2022 0000   ALBUMIN 3.5 09/09/2022 1216   AST 21 09/25/2022 0000   ALT 12 09/25/2022 0000   ALKPHOS 62 09/09/2022 1216   BILITOT 0.8 09/25/2022 0000   GFRNONAA 34 (L) 09/09/2022 1216  GFRNONAA 38 (L) 03/04/2021 1110   GFRAA 44 (L) 03/04/2021 1110   Lab Results  Component Value Date   CHOL 93 07/23/2022   HDL 47 (L) 07/23/2022   LDLCALC 26 07/23/2022   TRIG 110 07/23/2022   CHOLHDL 2.0 07/23/2022   Lab Results  Component Value Date   HGBA1C 5.2 09/08/2022   Lab Results  Component Value Date   VITAMINB12 1,548 (H) 08/08/2021   Lab Results  Component Value Date   TSH 0.46 07/23/2022     ASSESSMENT AND PLAN 76 y.o. year old female  has a past medical history of Allergic rhinitis, Allergy, Anxiety, Hyperlipidemia, Hypertension, Obesity, OSA (obstructive sleep apnea), Type II or unspecified type diabetes mellitus with renal manifestations, not stated as uncontrolled(250.40), Unspecified hypothyroidism, and Vitamin D deficiency. here with     ICD-10-CM   1. OSA on CPAP  G47.33 For home use only DME continuous positive airway pressure (CPAP)       Jocelyn Camacho is doing well on CPAP therapy. Compliance report reveals optimal compliance. She was encouraged to continue using CPAP nightly and for greater than 4 hours each night. Leak has improved. Risks of untreated sleep apnea review and education materials provided. Healthy lifestyle habits encouraged. She will follow up in 1 year, sooner if needed. She verbalizes understanding and agreement with this plan.    Orders Placed This Encounter  Procedures   For home use only DME continuous positive airway pressure (CPAP)    Supplies    Order Specific Question:   Length of Need    Answer:   Lifetime     Order Specific Question:   Patient has OSA or probable OSA    Answer:   Yes    Order Specific Question:   Is the patient currently using CPAP in the home    Answer:   Yes    Order Specific Question:   Settings    Answer:   Other see comments    Order Specific Question:   CPAP supplies needed    Answer:   Mask, headgear, cushions, filters, heated tubing and water chamber     No orders of the defined types were placed in this encounter.     Debbora Presto, FNP-C 11/18/2022, 11:41 AM [facility] [address] [place], [place] [address] [phone]

## 2022-11-24 ENCOUNTER — Encounter (INDEPENDENT_AMBULATORY_CARE_PROVIDER_SITE_OTHER): Payer: Medicare Other | Admitting: Ophthalmology

## 2022-11-24 DIAGNOSIS — H35033 Hypertensive retinopathy, bilateral: Secondary | ICD-10-CM

## 2022-11-24 DIAGNOSIS — H34812 Central retinal vein occlusion, left eye, with macular edema: Secondary | ICD-10-CM

## 2022-11-24 DIAGNOSIS — I10 Essential (primary) hypertension: Secondary | ICD-10-CM | POA: Diagnosis not present

## 2022-11-24 DIAGNOSIS — H43813 Vitreous degeneration, bilateral: Secondary | ICD-10-CM | POA: Diagnosis not present

## 2022-11-25 ENCOUNTER — Other Ambulatory Visit: Payer: Self-pay | Admitting: *Deleted

## 2022-11-25 DIAGNOSIS — I83022 Varicose veins of left lower extremity with ulcer of calf: Secondary | ICD-10-CM

## 2022-12-01 ENCOUNTER — Ambulatory Visit (INDEPENDENT_AMBULATORY_CARE_PROVIDER_SITE_OTHER): Payer: Medicare Other | Admitting: Nurse Practitioner

## 2022-12-01 ENCOUNTER — Encounter: Payer: Self-pay | Admitting: Nurse Practitioner

## 2022-12-01 VITALS — BP 112/74 | HR 85 | Temp 97.1°F | Wt 218.4 lb

## 2022-12-01 DIAGNOSIS — Z79899 Other long term (current) drug therapy: Secondary | ICD-10-CM

## 2022-12-01 DIAGNOSIS — B372 Candidiasis of skin and nail: Secondary | ICD-10-CM

## 2022-12-01 DIAGNOSIS — Z6841 Body Mass Index (BMI) 40.0 and over, adult: Secondary | ICD-10-CM

## 2022-12-01 DIAGNOSIS — Z794 Long term (current) use of insulin: Secondary | ICD-10-CM

## 2022-12-01 DIAGNOSIS — N3020 Other chronic cystitis without hematuria: Secondary | ICD-10-CM

## 2022-12-01 DIAGNOSIS — I878 Other specified disorders of veins: Secondary | ICD-10-CM

## 2022-12-01 DIAGNOSIS — N1832 Chronic kidney disease, stage 3b: Secondary | ICD-10-CM

## 2022-12-01 DIAGNOSIS — E1122 Type 2 diabetes mellitus with diabetic chronic kidney disease: Secondary | ICD-10-CM

## 2022-12-01 NOTE — Progress Notes (Signed)
FOLLOW UP  Assessment and Plan:  Type 2 diabetes mellitus with stage 3b chronic kidney disease, with long-term current use of insulin [facility]) Education: Reviewed 'ABCs' of diabetes management  Discussed goals to be met and/or maintained include A1C (<7) Blood pressure (<130/80) Cholesterol (LDL <70) Continue ACE, bASA, statin Discussed how what you eat and drink can aide in kidney protection. Stay well hydrated. Avoid high salt foods. Avoid NSAIDS. Keep BP and BG well controlled.   Take medications as prescribed. Remain active and exercise as tolerated daily. Maintain weight.  Continue to monitor.  Venous stasis of both lower extremities  Completed wound care 10/31/22 Refered to [facility] for further evaluation Continue to apply topical abx cream Keep covered. Continue to monitor  Candidiasis of the skin Resolved Continue Nystatin cream PRN Keep area free of moisture  Medication management All medications discussed and reviewed in full. All questions and concerns regarding medications addressed.    Obesity Discussed appropriate BMI Goal of losing 1 lb per month. Diet modification. Physical activity. Encouraged/praised to build confidence.  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 Continue to monitor  Orders Placed This Encounter  Procedures   Urine Culture   CBC with Differential/Platelet   COMPLETE METABOLIC PANEL WITH GFR   Urinalysis, Routine w reflex microscopic    All medications were reviewed with patient and family and fully reconciled. All questions answered fully, and patient and family members were encouraged to call the office with any further questions or concerns. Discussed goal to avoid readmission related to this   Over 20 minutes of exam, counseling, chart review, and complex, high/moderate level critical decision making was performed this visit.   Future Appointments  Date Time Provider  [place]  12/29/2022  8:40 AM [name], MD [facility] None  12/31/2022 10:40 AM Angelia Mould, MD [facility]  01/07/2023 10:00 AM [facility] 3 - EM [facility]  01/07/2023 10:20 AM Angelia Mould, MD [facility]  03/05/2023 10:00 AM Alycia Rossetti, NP [facility] None  11/09/2023 11:00 AM Lomax, Amy, NP [facility] None     HPI 76 y.o.female presents for a one month follow up month follow up.  Overall she reports feeling well today.  Saw wound care 11/05/2022 for Follow-up of chronic venous insufficiency.  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  The visit is requested by Dr. [name].  Given that she has had recurrent ulceration I think she would benefit from laser ablation of the left small saphenous vein.  Hopefully this will lower venous pressure and facilitate healing of the wound.  If she continues to have problems and I think she can have staged laser ablation of the left great saphenous vein in the proximal thigh.  We will try to schedule this in the near future.  She will have a follow up next month with [facility].     Saw [facility] 10/29/22 Injection performed for left and right knee OA.  She will f/u PRN.  Encouraged to work on weight loss and to be cautios with BG and diabetic diet.   She is no longer having to continue treatment for rash/skin candidiasis.  Reports healing well under the breast.  She has been treated in the past for reoccurring UTIs.  She reports trying to stay well hydrated.  She continues to have some frequency and urgently.  Denies dysuria, fever, chills, abd pain.  She does endorse increase in stress due to her daughter planning to have right knee replacement next week.  She will be visiting, along with her husband, to with her to help during this time.  BP well controlled in clinic today. BP Readings from Last 3 Encounters:  12/01/22 112/74  11/18/22 137/82  11/05/22 130/73    BMI is Body  mass index is 39.95 kg/m., she  has not been working on diet and exercise.  She is trying to start walking with her husband but continues to feel weak and deconditioned from hospital stay.  Wt Readings from Last 3 Encounters:  12/01/22 218 lb 6.4 oz (99.1 kg)  11/18/22 226 lb (102.5 kg)  11/05/22 218 lb (98.9 kg)    Current Outpatient Medications (Endocrine & Metabolic):    Dulaglutide (TRULICITY) 3 NL/9.7QB SOPN, Inject  3 MG(0.5 ML) into the skin  weekly for Diabetes (Dx: e10.29) (Patient taking differently: Inject 3 mg as directed every Sunday. Diabetes)   Finerenone (KERENDIA) 10 MG TABS, Take 10 mg by mouth daily. Take  1 tablet  Daily  for Diabetic Kidneys (Patient taking differently: Take 10 mg by mouth daily. Diabetic Kidneys)   insulin NPH-regular Human (70-30) 100 UNIT/ML injection, Patient injects 25 units in the AM and 10 units in the PM. Reduce each insulin dose by 2 units if glucose persistently <100 (Patient taking differently: Inject 5-20 Units into the skin See admin instructions. Patient injects 20 units in the AM and 5 units in the PM. Reduce each insulin dose by 2 units if glucose persistently <100)   levothyroxine (SYNTHROID) 125 MCG tablet, Take  1 tablet  Daily  on an empty stomach with only water for 30 minutes & no Antacid meds, Calcium or Magnesium for 4 hours & avoid Biotin (Patient taking differently: Take 125 mcg by mouth daily before breakfast. Take on an empty stomach with only water for 30 minutes & no Antacid meds, Calcium or Magnesium for 4 hours & avoid Biotin)  Current Outpatient Medications (Cardiovascular):    atenolol (TENORMIN) 100 MG tablet, Take 1 tablet by mouth once daily for blood pressure   furosemide (LASIX) 80 MG tablet, TAKE 1 TABLET BY MOUTH 1 TO 2 TIMES PER DAY AS NEEDED FOR FLUID RETENTION OR ANKLE SWELLING   rosuvastatin (CRESTOR) 5 MG tablet, Take 1 tablet once weekly as directed for Cholesterol LDL goal less than 70.   Current Outpatient  Medications (Analgesics):    acetaminophen (TYLENOL) 650 MG CR tablet, Take 1,300 mg by mouth every 8 (eight) hours as needed for pain.   allopurinol (ZYLOPRIM) 300 MG tablet, TAKE 1/2 TABLET(150 MG) BY MOUTH DAILY FOR GOUT PREVENTION (Patient taking differently: Take 150 mg by mouth daily. Gout prevention)   aspirin EC 81 MG tablet, Take 81 mg by mouth daily.   colchicine 0.6 MG tablet, Take  1 tablet  Daily  to Prevent Gout                                                                 /                         TAKE  BY                     MOUTH (Patient taking differently: Take 0.6 mg by mouth daily. Prevent Gout)   traMADol (ULTRAM) 50 MG tablet, Take 1 tablet (50 mg total) by mouth every 6 (six) hours as needed for moderate pain.  Current Outpatient Medications (Hematological):    Cyanocobalamin (B-12) 1000 MCG SUBL, Place 1,000 mcg under the tongue 2 (two) times a week. Saturday and Sunday  Current Outpatient Medications (Other):    Ascorbic Acid (VITAMIN C) 1000 MG tablet, Take 1,000 mg by mouth daily.   Cholecalciferol (VITAMIN D3) 5000 units CAPS, Take 5,000 Units by mouth daily.   dorzolamide-timolol (COSOPT) 22.3-6.8 MG/ML ophthalmic solution, Place 1 drop into the left eye 2 (two) times daily.   hyoscyamine (LEVSIN, ANASPAZ) 0.125 MG tablet, Take 1 tablet (0.125 mg total) by mouth every 6 (six) hours as needed. (Patient taking differently: Take 0.125 mg by mouth every 6 (six) hours as needed for bladder spasms.)   Lancets (ONETOUCH ULTRASOFT) lancets, Check blood sugar 3 times daily-DX-E10.9   nystatin cream (MYCOSTATIN), Apply 1 Application topically 2 (two) times daily.   ondansetron (ZOFRAN-ODT) 4 MG disintegrating tablet, Take 1 tablet (4 mg total) by mouth every 6 (six) hours as needed for nausea.   ONETOUCH ULTRA test strip, CHECK BLOOD SUGAR 3 TIMES A DAY   polymyxin B 500,000 Units in sodium chloride irrigation 0.9 % 500 mL, Irrigate with 1 Application as  directed every 30 (thirty) days. Eye Injection   zinc gluconate 50 MG tablet, Take 50 mg by mouth daily.  Past Medical History:  Diagnosis Date   Allergic rhinitis    Allergy    Anxiety    with medical procedures   Hyperlipidemia    Hypertension    Obesity    OSA (obstructive sleep apnea)    wears CPAP   Type II or unspecified type diabetes mellitus with renal manifestations, not stated as uncontrolled(250.40)    Unspecified hypothyroidism    Vitamin D deficiency      Allergies  Allergen Reactions   Penicillins Swelling   Sulfa Antibiotics Itching and Rash    ROS: all negative except above.   Physical Exam: Filed Weights   12/01/22 1031  Weight: 218 lb 6.4 oz (99.1 kg)      BP 112/74   Pulse 85   Temp (!) 97.1 F (36.2 C)   Wt 218 lb 6.4 oz (99.1 kg)   SpO2 99%   BMI 39.95 kg/m  General Appearance: Well nourished, in no apparent distress. Eyes: PERRLA, EOMs, conjunctiva no swelling or erythema Sinuses: No Frontal/maxillary tenderness ENT/Mouth: Ext aud canals clear, TMs without erythema, bulging. No erythema, swelling, or exudate on post pharynx.  Tonsils not swollen or erythematous. Hearing normal.  Neck: Supple, thyroid normal.  Respiratory: Respiratory effort normal, BS equal bilaterally without rales, rhonchi, wheezing or stridor.  Cardio: RRR with no MRGs. Brisk peripheral pulses without edema.  Abdomen: Soft, + BS.  Non tender, no guarding, rebound, hernias, masses. Lymphatics: Non tender without lymphadenopathy.  Musculoskeletal: Full ROM, 5/5 strength, normal gait.  Skin: Appropriate color for ethnicity.  Warm.  No rashes.   Neuro: Cranial nerves intact. Normal muscle tone, no cerebellar symptoms. Sensation intact.  Psych: Awake and oriented X 3, normal affect, Insight and Judgment appropriate.     Darrol Jump, NP 11:00 AM [facility]

## 2022-12-02 LAB — URINALYSIS, ROUTINE W REFLEX MICROSCOPIC
Bilirubin Urine: NEGATIVE
Glucose, UA: NEGATIVE
Hgb urine dipstick: NEGATIVE
Ketones, ur: NEGATIVE
Leukocytes,Ua: NEGATIVE
Nitrite: NEGATIVE
Protein, ur: NEGATIVE
Specific Gravity, Urine: 1.012 (ref 1.001–1.035)
pH: 6 (ref 5.0–8.0)

## 2022-12-02 LAB — CBC WITH DIFFERENTIAL/PLATELET
Absolute Monocytes: 546 cells/uL (ref 200–950)
Basophils Absolute: 62 cells/uL (ref 0–200)
Basophils Relative: 0.8 %
Eosinophils Absolute: 234 cells/uL (ref 15–500)
Eosinophils Relative: 3 %
HCT: 32.9 % — ABNORMAL LOW (ref 35.0–45.0)
Hemoglobin: 10.9 g/dL — ABNORMAL LOW (ref 11.7–15.5)
Lymphs Abs: 2543 cells/uL (ref 850–3900)
MCH: 28.2 pg (ref 27.0–33.0)
MCHC: 33.1 g/dL (ref 32.0–36.0)
MCV: 85 fL (ref 80.0–100.0)
MPV: 12.3 fL (ref 7.5–12.5)
Monocytes Relative: 7 %
Neutro Abs: 4415 cells/uL (ref 1500–7800)
Neutrophils Relative %: 56.6 %
Platelets: 417 10*3/uL — ABNORMAL HIGH (ref 140–400)
RBC: 3.87 10*6/uL (ref 3.80–5.10)
RDW: 15.1 % — ABNORMAL HIGH (ref 11.0–15.0)
Total Lymphocyte: 32.6 %
WBC: 7.8 10*3/uL (ref 3.8–10.8)

## 2022-12-02 LAB — COMPLETE METABOLIC PANEL WITH GFR
AG Ratio: 1.4 (calc) (ref 1.0–2.5)
ALT: 10 U/L (ref 6–29)
AST: 15 U/L (ref 10–35)
Albumin: 4.1 g/dL (ref 3.6–5.1)
Alkaline phosphatase (APISO): 78 U/L (ref 37–153)
BUN/Creatinine Ratio: 20 (calc) (ref 6–22)
BUN: 24 mg/dL (ref 7–25)
CO2: 30 mmol/L (ref 20–32)
Calcium: 9.6 mg/dL (ref 8.6–10.4)
Chloride: 103 mmol/L (ref 98–110)
Creat: 1.21 mg/dL — ABNORMAL HIGH (ref 0.60–1.00)
Globulin: 2.9 g/dL (calc) (ref 1.9–3.7)
Glucose, Bld: 83 mg/dL (ref 65–99)
Potassium: 3.6 mmol/L (ref 3.5–5.3)
Sodium: 143 mmol/L (ref 135–146)
Total Bilirubin: 0.7 mg/dL (ref 0.2–1.2)
Total Protein: 7 g/dL (ref 6.1–8.1)
eGFR: 46 mL/min/{1.73_m2} — ABNORMAL LOW (ref >=60)

## 2022-12-02 LAB — URINE CULTURE
MICRO NUMBER:: 14297521
SPECIMEN QUALITY:: ADEQUATE

## 2022-12-10 ENCOUNTER — Encounter: Payer: Self-pay | Admitting: Internal Medicine

## 2022-12-25 ENCOUNTER — Other Ambulatory Visit: Payer: Self-pay | Admitting: *Deleted

## 2022-12-25 MED ORDER — LORAZEPAM 1 MG PO TABS: ORAL_TABLET | ORAL | 0 refills | Status: DC

## 2022-12-26 ENCOUNTER — Telehealth: Payer: Self-pay

## 2022-12-26 NOTE — Telephone Encounter (Signed)
Pt called to confirm appt time and when to take pre-med.  Reviewed pt's chart, returned call for clarification, two identifiers used. Reviewed appt time with pt, arrival time with pt, and to take pre-med 30 min prior. Confirmed understanding.

## 2022-12-28 ENCOUNTER — Other Ambulatory Visit: Payer: Self-pay | Admitting: Nurse Practitioner

## 2022-12-28 DIAGNOSIS — M1000 Idiopathic gout, unspecified site: Secondary | ICD-10-CM

## 2022-12-29 ENCOUNTER — Encounter (INDEPENDENT_AMBULATORY_CARE_PROVIDER_SITE_OTHER): Payer: Medicare Other | Admitting: Ophthalmology

## 2022-12-29 DIAGNOSIS — H43813 Vitreous degeneration, bilateral: Secondary | ICD-10-CM | POA: Diagnosis not present

## 2022-12-29 DIAGNOSIS — H35033 Hypertensive retinopathy, bilateral: Secondary | ICD-10-CM | POA: Diagnosis not present

## 2022-12-29 DIAGNOSIS — I10 Essential (primary) hypertension: Secondary | ICD-10-CM

## 2022-12-29 DIAGNOSIS — H34812 Central retinal vein occlusion, left eye, with macular edema: Secondary | ICD-10-CM

## 2022-12-30 ENCOUNTER — Other Ambulatory Visit: Payer: Self-pay | Admitting: Internal Medicine

## 2022-12-30 DIAGNOSIS — E039 Hypothyroidism, unspecified: Secondary | ICD-10-CM

## 2022-12-31 ENCOUNTER — Encounter: Payer: Self-pay | Admitting: Vascular Surgery

## 2022-12-31 ENCOUNTER — Ambulatory Visit (INDEPENDENT_AMBULATORY_CARE_PROVIDER_SITE_OTHER): Payer: Medicare Other | Admitting: Vascular Surgery

## 2022-12-31 VITALS — BP 140/78 | HR 85 | Temp 97.6°F | Resp 18 | Ht 62.0 in | Wt 218.0 lb

## 2022-12-31 DIAGNOSIS — I872 Venous insufficiency (chronic) (peripheral): Secondary | ICD-10-CM

## 2022-12-31 DIAGNOSIS — L97228 Non-pressure chronic ulcer of left calf with other specified severity: Secondary | ICD-10-CM

## 2022-12-31 DIAGNOSIS — I83022 Varicose veins of left lower extremity with ulcer of calf: Secondary | ICD-10-CM

## 2022-12-31 HISTORY — PX: ENDOVENOUS ABLATION SAPHENOUS VEIN W/ LASER: SUR449

## 2022-12-31 NOTE — Progress Notes (Signed)
Patient name: Jocelyn Camacho MRN: [id number] DOB: 1946/12/17 Sex: female  REASON FOR VISIT: For laser ablation left small saphenous vein.  HPI: Jocelyn Camacho is a 77 y.o. female who I last saw on 11/05/2022.  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  The reflux in the left great saphenous vein was in the proximal thigh down to the mid thigh only.  I felt that if she continued to have problems after addressing her small saphenous vein she might be a candidate for staged laser ablation of the left great saphenous vein proximally.  I did look at her left great saphenous vein with the SonoSite at the time of her last visit..  She has reflux in the proximal thigh.  In the proximal thigh the vein then branches and becomes smaller.  It looks like there is also an anterior accessory saphenous vein but this vein is small. I also looked at the small saphenous vein. This also has significant reflux.  It is dilated.  Saphenous popliteal junction can clearly be identified.  Current Outpatient Medications  Medication Sig Dispense Refill   acetaminophen (TYLENOL) 650 MG CR tablet Take 1,300 mg by mouth every 8 (eight) hours as needed for pain.     allopurinol (ZYLOPRIM) 300 MG tablet TAKE 1/2 TABLET(150 MG) BY MOUTH DAILY FOR GOUT PREVENTION 45 tablet 2   Ascorbic Acid (VITAMIN C) 1000 MG tablet Take 1,000 mg by mouth daily.     aspirin EC 81 MG tablet Take 81 mg by mouth daily.     atenolol (TENORMIN) 100 MG tablet Take 1 tablet by mouth once daily for blood pressure 90 tablet 0   Cholecalciferol (VITAMIN D3) 5000 units CAPS Take 5,000 Units by mouth daily.     colchicine 0.6 MG tablet Take  1 tablet  Daily  to Prevent Gout                                                                 /                         TAKE                     BY                     MOUTH (Patient taking differently: Take 0.6 mg by mouth daily. Prevent Gout) 90 tablet 3   Cyanocobalamin (B-12) 1000 MCG SUBL Place  1,000 mcg under the tongue 2 (two) times a week. Saturday and Sunday     dorzolamide-timolol (COSOPT) 22.3-6.8 MG/ML ophthalmic solution Place 1 drop into the left eye 2 (two) times daily.     Dulaglutide (TRULICITY) 3 XB/3.5HG SOPN Inject  3 MG(0.5 ML) into the skin  weekly for Diabetes (Dx: e10.29) (Patient taking differently: Inject 3 mg as directed every Sunday. Diabetes) 6 mL 3   Finerenone (KERENDIA) 10 MG TABS Take 10 mg by mouth daily. Take  1 tablet  Daily  for Diabetic Kidneys (Patient taking differently: Take 10 mg by mouth daily. Diabetic Kidneys) 90 tablet 0   furosemide (LASIX) 80 MG tablet TAKE 1 TABLET BY MOUTH 1 TO 2 TIMES PER DAY  AS NEEDED FOR FLUID RETENTION OR ANKLE SWELLING 180 tablet 3   hyoscyamine (LEVSIN, ANASPAZ) 0.125 MG tablet Take 1 tablet (0.125 mg total) by mouth every 6 (six) hours as needed. (Patient taking differently: Take 0.125 mg by mouth every 6 (six) hours as needed for bladder spasms.) 60 tablet 1   insulin NPH-regular Human (70-30) 100 UNIT/ML injection Patient injects 25 units in the AM and 10 units in the PM. Reduce each insulin dose by 2 units if glucose persistently <100 (Patient taking differently: Inject 5-20 Units into the skin See admin instructions. Patient injects 20 units in the AM and 5 units in the PM. Reduce each insulin dose by 2 units if glucose persistently <100) 10 mL    Lancets (ONETOUCH ULTRASOFT) lancets Check blood sugar 3 times daily-DX-E10.9 300 each 1   levothyroxine (SYNTHROID) 125 MCG tablet TAKE 1 TABLET BY MOUTH DAILY ON AN EMPTY STOMACH WITH ONLY WATER FOR 30 MINUTES& NO ANTACID MEDS, CALCIUM OR MAGNESIUM FOR 4 HOURS& AVIOD BIOTIN 90 tablet 3   LORazepam (ATIVAN) 1 MG tablet Take 1 tablet 30 minutes prior to leaving house on day of office surgery.  Bring second tablet with you to office on day of office surgery. 2 tablet 0   nystatin cream (MYCOSTATIN) Apply 1 Application topically 2 (two) times daily. 30 g 2   ondansetron (ZOFRAN-ODT)  4 MG disintegrating tablet Take 1 tablet (4 mg total) by mouth every 6 (six) hours as needed for nausea. 20 tablet 0   ONETOUCH ULTRA test strip CHECK BLOOD SUGAR 3 TIMES A DAY 300 strip 3   polymyxin B 500,000 Units in sodium chloride irrigation 0.9 % 500 mL Irrigate with 1 Application as directed every 30 (thirty) days. Eye Injection     rosuvastatin (CRESTOR) 5 MG tablet Take 1 tablet once weekly as directed for Cholesterol LDL goal less than 70. 13 tablet 3   traMADol (ULTRAM) 50 MG tablet Take 1 tablet (50 mg total) by mouth every 6 (six) hours as needed for moderate pain. 15 tablet 0   zinc gluconate 50 MG tablet Take 50 mg by mouth daily.     No current facility-administered medications for this visit.    PHYSICAL EXAM: Vitals:   12/31/22 1108  BP: (!) 140/78  Pulse: 85  Resp: 18  Temp: 97.6 F (36.4 C)  TempSrc: Temporal  SpO2: 99%  Weight: 218 lb (98.9 kg)  Height: 5\' 2"  (1.575 m)    PROCEDURE: Laser ablation of the left small saphenous vein  TECHNIQUE: The patient was taken to the exam room and placed prone.  I looked at the left small saphenous vein myself with the SonoSite and felt that I could cannulate this in the mid calf.  The left leg was prepped and draped in usual sterile fashion.  Under ultrasound guidance, after the skin was anesthetized, I cannulated the left small saphenous vein in the mid calf with a micropuncture needle and a micropuncture sheath was introduced over a wire.  I then advanced a 45 cm sheath over the wire and the wire and dilator were removed.  Laser fiber was positioned at the end of the sheath and the sheath retracted.  I position the laser fiber in the small saphenous vein just before it started to dive deeper to join the popliteal vein.  Tumescent anesthesia was administered circumferentially around the vein.  Patient was placed in Trendelenburg.  Laser ablation was performed of the left small saphenous vein down to the  mid calf.  Deitra Mayo [facility] [place] [phone]

## 2022-12-31 NOTE — Progress Notes (Signed)
     Laser Ablation Procedure    Date: 12/31/2022   Jocelyn Camacho DOB:1946-05-12  Consent signed: Yes      Surgeon: Gae Gallop MD   Procedure: Laser Ablation: left Small Saphenous Vein  BP (!) 140/78 (BP Location: Left Wrist, Patient Position: Sitting, Cuff Size: Normal)   Pulse 85   Temp 97.6 F (36.4 C) (Temporal)   Resp 18   Ht 5\' 2"  (1.575 m)   Wt 218 lb (98.9 kg)   SpO2 99%   BMI 39.87 kg/m   Tumescent Anesthesia: 200 cc 0.9% NaCl with 50 cc Lidocaine HCL 1%  and 15 cc 8.4% NaHCO3  Local Anesthesia: 3 cc Lidocaine HCL and NaHCO3 (ratio 2:1)  7 watts continuous mode     Total energy: 614 Joules     Total time: 87 seconds Treatment Length  12 cm   Laser Fiber Ref. #  [id number]   Lot #  [id number]     Patient tolerated procedure well  Notes: Patient wore face mask.  All staff members wore facial masks.  Mrs. Huertas took Ativan 1 mg (1 tablet) on 12-31-2022 at 10:15 AM and at 10:55 AM.    Description of Procedure:  After marking the course of the secondary varicosities, the patient was placed on the operating table in the prone position, and the left leg was prepped and draped in sterile fashion.   Local anesthetic was administered and under ultrasound guidance the saphenous vein was accessed with a micro needle and guide wire; then the mirco puncture sheath was placed.  A guide wire was inserted saphenopopliteal junction , followed by a 5 french sheath.  The position of the sheath and then the laser fiber below the junction was confirmed using the ultrasound.  Tumescent anesthesia was administered along the course of the saphenous vein using ultrasound guidance. The patient was placed in Trendelenburg position and protective laser glasses were placed on patient and staff, and the laser was fired at 7 watts continuous mode for a total of 614 joules.      Steri strip was applied to the IV insertion site and ABD pads and thigh high compression stockings were applied.   Ace wrap bandages were applied over the left calf and thigh and at the top of the saphenopopliteal junction. Blood loss was less than 15 cc.  Discharge instructions reviewed with patient and hardcopy of discharge instructions given to patient to take home. The patient was transported out of the operating room to her car by wheelchair having tolerated the procedure well.

## 2023-01-02 ENCOUNTER — Telehealth: Payer: Self-pay

## 2023-01-02 NOTE — Telephone Encounter (Signed)
Pt called stating that she had an ablation on 1/10 and wanted to make sure it was ok to remove the dressing since it had been 2 days.  Reviewed pt's chart, returned call for clarification, two identifiers used. Informed her that it had been 48 hours, so she was ok to remove the ACE wrap. Pt confirmed that she would still need to wear the compression stockings. She stated that d/t the size of her thigh, the stockings are fairly tight and sometimes roll down. Instructed her to keep them pulled up as best she can because the roll can make them even tighter. Pt to call if any issues. Confirmed understanding.

## 2023-01-05 ENCOUNTER — Other Ambulatory Visit: Payer: Self-pay | Admitting: Internal Medicine

## 2023-01-05 DIAGNOSIS — E1122 Type 2 diabetes mellitus with diabetic chronic kidney disease: Secondary | ICD-10-CM

## 2023-01-07 ENCOUNTER — Ambulatory Visit (HOSPITAL_COMMUNITY)
Admission: RE | Admit: 2023-01-07 | Discharge: 2023-01-07 | Disposition: A | Discharge status: Home / Self Care | Payer: Medicare Other | Source: Ambulatory Visit | Attending: Vascular Surgery | Admitting: Vascular Surgery

## 2023-01-07 ENCOUNTER — Encounter: Payer: Self-pay | Admitting: Vascular Surgery

## 2023-01-07 ENCOUNTER — Ambulatory Visit (INDEPENDENT_AMBULATORY_CARE_PROVIDER_SITE_OTHER): Payer: Medicare Other | Admitting: Vascular Surgery

## 2023-01-07 VITALS — BP 138/78 | HR 91 | Temp 98.6°F | Resp 18 | Ht 62.0 in | Wt 218.0 lb

## 2023-01-07 DIAGNOSIS — I83022 Varicose veins of left lower extremity with ulcer of calf: Secondary | ICD-10-CM | POA: Diagnosis present

## 2023-01-07 DIAGNOSIS — L97228 Non-pressure chronic ulcer of left calf with other specified severity: Secondary | ICD-10-CM | POA: Insufficient documentation

## 2023-01-07 DIAGNOSIS — I872 Venous insufficiency (chronic) (peripheral): Secondary | ICD-10-CM

## 2023-01-07 NOTE — Progress Notes (Signed)
Patient name: Jocelyn Camacho MRN: [id number] DOB: Feb 09, 1946 Sex: female  REASON FOR VISIT: Follow-up after laser ablation of the left small saphenous vein.  HPI: Jocelyn Camacho is a 77 y.o. female with  CEAP C6r venous disease.  She has had recurrent venous ulcers of the left leg.  She did not have evidence of significant arterial insufficiency.  She had biphasic Doppler signals in both feet.  She did not have significant deep venous reflux.  She had reflux in the great saphenous vein in the proximal thigh only and also significant reflux in the small saphenous vein.  I felt she would benefit from laser ablation of the small saphenous vein on the left.  If the wound on her left leg did not heal we could consider laser ablation of the left great saphenous vein.  Patient is doing well.  She has no specific complaints.  She has been wearing her thigh-high compression stocking.  She denies chest pain or shortness of breath.   Current Outpatient Medications  Medication Sig Dispense Refill   acetaminophen (TYLENOL) 650 MG CR tablet Take 1,300 mg by mouth every 8 (eight) hours as needed for pain.     allopurinol (ZYLOPRIM) 300 MG tablet TAKE 1/2 TABLET(150 MG) BY MOUTH DAILY FOR GOUT PREVENTION 45 tablet 2   Ascorbic Acid (VITAMIN C) 1000 MG tablet Take 1,000 mg by mouth daily.     aspirin EC 81 MG tablet Take 81 mg by mouth daily.     atenolol (TENORMIN) 100 MG tablet Take 1 tablet by mouth once daily for blood pressure 90 tablet 0   Cholecalciferol (VITAMIN D3) 5000 units CAPS Take 5,000 Units by mouth daily.     colchicine 0.6 MG tablet Take  1 tablet  Daily  to Prevent Gout                                                                 /                         TAKE                     BY                     MOUTH (Patient taking differently: Take 0.6 mg by mouth daily. Prevent Gout) 90 tablet 3   Cyanocobalamin (B-12) 1000 MCG SUBL Place 1,000 mcg under the tongue 2 (two) times a week. Saturday  and Sunday     dorzolamide-timolol (COSOPT) 22.3-6.8 MG/ML ophthalmic solution Place 1 drop into the left eye 2 (two) times daily.     Dulaglutide (TRULICITY) 3 ZY/6.0YT SOPN INJECT 3 MG SUBCUTANEOUSLY ONCE A WEEK FOR DIABETES 12 mL 0   Finerenone (KERENDIA) 10 MG TABS Take 10 mg by mouth daily. Take  1 tablet  Daily  for Diabetic Kidneys (Patient taking differently: Take 10 mg by mouth daily. Diabetic Kidneys) 90 tablet 0   furosemide (LASIX) 80 MG tablet TAKE 1 TABLET BY MOUTH 1 TO 2 TIMES PER DAY AS NEEDED FOR FLUID RETENTION OR ANKLE SWELLING 180 tablet 3   hyoscyamine (LEVSIN, ANASPAZ) 0.125 MG tablet Take 1 tablet (0.125 mg total) by  mouth every 6 (six) hours as needed. (Patient taking differently: Take 0.125 mg by mouth every 6 (six) hours as needed for bladder spasms.) 60 tablet 1   insulin NPH-regular Human (70-30) 100 UNIT/ML injection Patient injects 25 units in the AM and 10 units in the PM. Reduce each insulin dose by 2 units if glucose persistently <100 (Patient taking differently: Inject 5-20 Units into the skin See admin instructions. Patient injects 20 units in the AM and 5 units in the PM. Reduce each insulin dose by 2 units if glucose persistently <100) 10 mL    Lancets (ONETOUCH ULTRASOFT) lancets Check blood sugar 3 times daily-DX-E10.9 300 each 1   levothyroxine (SYNTHROID) 125 MCG tablet TAKE 1 TABLET BY MOUTH DAILY ON AN EMPTY STOMACH WITH ONLY WATER FOR 30 MINUTES& NO ANTACID MEDS, CALCIUM OR MAGNESIUM FOR 4 HOURS& AVIOD BIOTIN 90 tablet 3   LORazepam (ATIVAN) 1 MG tablet Take 1 tablet 30 minutes prior to leaving house on day of office surgery.  Bring second tablet with you to office on day of office surgery. 2 tablet 0   nystatin cream (MYCOSTATIN) Apply 1 Application topically 2 (two) times daily. 30 g 2   ondansetron (ZOFRAN-ODT) 4 MG disintegrating tablet Take 1 tablet (4 mg total) by mouth every 6 (six) hours as needed for nausea. 20 tablet 0   ONETOUCH ULTRA test strip  CHECK BLOOD SUGAR 3 TIMES A DAY 300 strip 3   polymyxin B 500,000 Units in sodium chloride irrigation 0.9 % 500 mL Irrigate with 1 Application as directed every 30 (thirty) days. Eye Injection     rosuvastatin (CRESTOR) 5 MG tablet Take 1 tablet once weekly as directed for Cholesterol LDL goal less than 70. 13 tablet 3   traMADol (ULTRAM) 50 MG tablet Take 1 tablet (50 mg total) by mouth every 6 (six) hours as needed for moderate pain. 15 tablet 0   zinc gluconate 50 MG tablet Take 50 mg by mouth daily.     No current facility-administered medications for this visit.   REVIEW OF SYSTEMS: Valu.Nieves ] denotes positive finding; [  ] denotes negative finding  CARDIOVASCULAR:  [ ]  chest pain   [ ]  dyspnea on exertion  [ ]  leg swelling  CONSTITUTIONAL:  [ ]  fever   [ ]  chills  PHYSICAL EXAM: Vitals:   01/07/23 1035  BP: 138/78  Pulse: 91  Resp: 18  Temp: 98.6 F (37 C)  TempSrc: Temporal  SpO2: 98%  Weight: 218 lb (98.9 kg)  Height: 5\' 2"  (1.575 m)   GENERAL: The patient is a well-nourished female, in no acute distress. The vital signs are documented above. CARDIOVASCULAR: There is a regular rate and rhythm. PULMONARY: There is good air exchange bilaterally without wheezing or rales. VASCULAR: She has minimal bruising in her posterior calf. The wound on her left leg at this point has essentially healed.   DATA:  VENOUS DUPLEX: I have independently interpreted her venous duplex scan today.  There is no evidence of DVT in the left lower extremity.  The small saphenous vein is closed from the proximal calf to the mid calf.  MEDICAL ISSUES:  CHRONIC VENOUS INSUFFICIENCY: This patient has undergone successful laser ablation of the left small saphenous vein.  The wound on her leg at this point has essentially healed.  She has a short segment of reflux in the left great saphenous vein in the thigh.  My plan was only to address this if she had recurrent  problems.  I will see her back in April.  If  everything is healed up I think we can hold off on laser ablation.  If she has problems with recurrent ulceration I think we should address the reflux in her left great saphenous vein.  She knows to call sooner if she has problems.  She has 1 more week with her thigh-high compression stocking.   Deitra Mayo [facility] [place] [phone]

## 2023-01-29 ENCOUNTER — Other Ambulatory Visit: Payer: Self-pay | Admitting: Internal Medicine

## 2023-01-29 DIAGNOSIS — Z1231 Encounter for screening mammogram for malignant neoplasm of breast: Secondary | ICD-10-CM

## 2023-01-30 ENCOUNTER — Ambulatory Visit
Admission: RE | Admit: 2023-01-30 | Discharge: 2023-01-30 | Disposition: A | Discharge status: Home / Self Care | Payer: Medicare Other | Source: Ambulatory Visit | Attending: Internal Medicine | Admitting: Internal Medicine

## 2023-01-30 DIAGNOSIS — Z1231 Encounter for screening mammogram for malignant neoplasm of breast: Secondary | ICD-10-CM

## 2023-02-02 ENCOUNTER — Encounter (INDEPENDENT_AMBULATORY_CARE_PROVIDER_SITE_OTHER): Payer: Medicare Other | Admitting: Ophthalmology

## 2023-02-02 DIAGNOSIS — I10 Essential (primary) hypertension: Secondary | ICD-10-CM

## 2023-02-02 DIAGNOSIS — H34812 Central retinal vein occlusion, left eye, with macular edema: Secondary | ICD-10-CM | POA: Diagnosis not present

## 2023-02-02 DIAGNOSIS — H43813 Vitreous degeneration, bilateral: Secondary | ICD-10-CM

## 2023-02-02 DIAGNOSIS — H35033 Hypertensive retinopathy, bilateral: Secondary | ICD-10-CM | POA: Diagnosis not present

## 2023-02-04 ENCOUNTER — Other Ambulatory Visit: Payer: Self-pay | Admitting: Internal Medicine

## 2023-02-04 DIAGNOSIS — R928 Other abnormal and inconclusive findings on diagnostic imaging of breast: Secondary | ICD-10-CM

## 2023-02-13 ENCOUNTER — Ambulatory Visit: Payer: Medicare Other

## 2023-02-13 ENCOUNTER — Ambulatory Visit
Admission: RE | Admit: 2023-02-13 | Discharge: 2023-02-13 | Disposition: A | Discharge status: Home / Self Care | Payer: Medicare Other | Source: Ambulatory Visit | Attending: Internal Medicine | Admitting: Internal Medicine

## 2023-02-13 DIAGNOSIS — R928 Other abnormal and inconclusive findings on diagnostic imaging of breast: Secondary | ICD-10-CM

## 2023-03-04 NOTE — Progress Notes (Unsigned)
MEDICARE ANNUAL WELLNESS   Assessment and Plan:  Encounter for Medicare annual wellness exam 1 year  Essential hypertension Continue current medications: Monitor blood pressure at home; call if consistently over 130/80 Continue DASH diet.   Reminder to go to the ER if any CP, SOB, nausea, dizziness, severe HA, changes vision/speech, left arm numbness and tingling and jaw pain. - CMP/GFR -CBC  OSA (obstructive sleep apnea) Weight loss advised, needs to wear nightly, may benefit from new mask/titration   Hypothyroidism, unspecified hypothyroidism type Taking levothyroxine 179mg daily Reminder to take on an empty stomach 30-672ms before first meal of the day. No antacid medications for 4 hours. 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   Hyperlipidemia associated with T2DM (HCC) Continue medications:Losartan '100mg'$  Discussed dietary and exercise modifications Low fat diet - Lipid panel   Vitamin D deficiency Continue supplementation to maintain goal of 70-100 Taking Vitamin D 5,00IU, 2 tabs daily Defer vitamin D level  Type 2 diabetes with morbid obesity(HCC) Overweight Long discussion about weight loss, diet, and exercise Recommended diet heavy in fruits and veggies and low in animal meats, cheeses, and dairy products, appropriate calorie intake Patient will work on decreasing saturate fats and simple carbs, increase lean protein and activity as tolerated Follow up at next visit   Medication management - Magnesium  CKD stage 3 due to type 2 diabetes mellitus Follows with nephrology, Increase fluids, avoid NSAIDS, monitor sugars, will monitor -  Microalbumin/creatinine urine ratio  - CMP/GFR  Anxiety Will take valium prior to procedures but would like to be able to take AS needed for sleep, long discussion about addictive nature and tolerance, will only take PRN.   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  Type 2 diabetes mellitus with hyperlipidemia (HCC) Continue 70/30, 20 untis in am and 8 unit PM. Rx Trulicity 3 mg SQ QW Discussed general issues about diabetes pathophysiology and management., Educational material distributed., Suggested low cholesterol diet., Encouraged aerobic exercise., Discussed foot care., Reminded to get yearly retinal exam. -     Hemoglobin A1c     Further disposition pending results if labs check today. Discussed med's effects and SE's.   Over 30 minutes of face to face interview, exam, counseling, chart review, and critical decision making was performed.    Discussed med's effects and SE's. Screening labs and tests as requested with regular follow-up as recommended. Future Appointments  Date Time Provider Department Center  03/16/2023  8:15 AM MaHayden PedroMD [facility] None  03/25/2023  1:00 PM DiAngelia MouldMD [facility]  11/09/2023 11:00 AM Lomax, Amy, NP [facility] None    Plan:   During the course of the visit the patient was educated and counseled about appropriate screening and preventive services including:   Pneumococcal vaccine  Prevnar 13 Influenza vaccine Td vaccine Screening electrocardiogram Bone densitometry screening Colorectal cancer screening Diabetes screening Glaucoma screening Nutrition counseling  Advanced directives: requested   HPI  [age].o. AA female  presents for medicare wellness and 3 month follow up.   She has hx of venous stasis, recurrent LE ulcers, She has been followed by vascular surgery.  Had ablation and doing well at this time  She is getting shots for Mac Degen with Dr. MaMalva Coganshe is prescribed valium to take prior to  eye injections.   She does have vitiligo of hands knees and face.   She has OSA and had  sleep study a few months ago and has been sleeping with CPAP every night   She is following with ortho Dr. Rip Harbour at [facility] for bilateral knee pain and has been advised may need TKA at some point. She had injections 2 months ago and did have relief until recently.  She will take tylenol only if needed, uses OTC topicals and takes tumeric/bioprene supplement daily.   She has not noticed any swelling but reports she can feel some tightness in the right knee.  She does use a cane for ambulation but left it in the car today.  BMI is Body mass index is 40.71 kg/m., she has been working on diet, reducing white items/starches, and sweets. Knees  Wt Readings from Last 3 Encounters:  03/05/23 222 lb 9.6 oz (101 kg)  01/07/23 218 lb (98.9 kg)  12/31/22 218 lb (98.9 kg)   Her blood pressure has been controlled at home, today their BP is BP: 132/78  BP Readings from Last 3 Encounters:  03/05/23 132/78  01/07/23 138/78  12/31/22 (!) 140/78    She does not workout due to knee pain.  She denies chest pain, shortness of breath, dizziness.   She is on cholesterol medication and denies myalgias. Her cholesterol is at goal. The cholesterol last visit was:   Lab Results  Component Value Date   CHOL 93 07/23/2022   HDL 47 (L) 07/23/2022   LDLCALC 26 07/23/2022   TRIG 110 07/23/2022   CHOLHDL 2.0 07/23/2022   She has had diabetes for 3 years. She has been working on diet and exercise for Diabetes with CKD stage III, she is on ACE, she is on bASA, She is off metformin due to kidneys, she is on 70/30 insulin 20 unit AM  and 8 units PM and Trulicity 3 mg QW denies paresthesia of the feet, polydipsia and polyuria. She is checking her sugars, fasting runs in 90-100, evening is 90- 140, one outlier of 140. Checks sugars twice a day with one touch ultra. Denies any hypoglycemia. Last A1C in the office was:  Lab Results  Component Value Date   HGBA1C 5.2 09/08/2022   Drinking lots of water,.  Follows  with nephrology and next appt in 1 month She has mild edema since reducing dose, typically well managed by compression.  Lab Results  Component Value Date   EGFR 46 (L) 12/01/2022     Patient is on Vitamin D supplement.   Lab Results  Component Value Date   VD25OH 74 07/23/2022     She is on thyroid medication. Her medication was not changed last visit. She is on 125 mcg daily. She takes it 30 mins before food with just water.  Lab Results  Component Value Date   TSH 0.46 07/23/2022   Patient is on allopurinol for gout and does not report a recent flare.  Lab Results  Component Value Date   LABURIC 5.9 11/11/2021     Current Medications:  Current Outpatient Medications on File Prior to Visit  Medication Sig Dispense Refill   acetaminophen (TYLENOL) 650 MG CR tablet Take 1,300 mg by mouth every 8 (eight) hours as needed for pain.     allopurinol (ZYLOPRIM) 300 MG tablet TAKE 1/2 TABLET(150 MG) BY MOUTH DAILY FOR GOUT PREVENTION 45 tablet 2   Ascorbic Acid (VITAMIN C) 1000 MG tablet Take 1,000 mg  by mouth daily.     aspirin EC 81 MG tablet Take 81 mg by mouth daily.     atenolol (TENORMIN) 100 MG tablet Take 1 tablet by mouth once daily for blood pressure 90 tablet 0   Cholecalciferol (VITAMIN D3) 5000 units CAPS Take 5,000 Units by mouth daily.     colchicine 0.6 MG tablet Take  1 tablet  Daily  to Prevent Gout                                                                 /                         TAKE                     BY                     MOUTH (Patient taking differently: Take 0.6 mg by mouth daily. Prevent Gout) 90 tablet 3   Cyanocobalamin (B-12) 1000 MCG SUBL Place 1,000 mcg under the tongue 2 (two) times a week. Saturday and Sunday     dorzolamide-timolol (COSOPT) 22.3-6.8 MG/ML ophthalmic solution Place 1 drop into the left eye 2 (two) times daily.     Dulaglutide (TRULICITY) 3 0000000 SOPN INJECT 3 MG SUBCUTANEOUSLY ONCE A WEEK FOR DIABETES 12 mL 0   Finerenone  (KERENDIA) 10 MG TABS Take 10 mg by mouth daily. Take  1 tablet  Daily  for Diabetic Kidneys (Patient taking differently: Take 10 mg by mouth daily. Diabetic Kidneys) 90 tablet 0   furosemide (LASIX) 80 MG tablet TAKE 1 TABLET BY MOUTH 1 TO 2 TIMES PER DAY AS NEEDED FOR FLUID RETENTION OR ANKLE SWELLING 180 tablet 3   hyoscyamine (LEVSIN, ANASPAZ) 0.125 MG tablet Take 1 tablet (0.125 mg total) by mouth every 6 (six) hours as needed. (Patient taking differently: Take 0.125 mg by mouth every 6 (six) hours as needed for bladder spasms.) 60 tablet 1   insulin NPH-regular Human (70-30) 100 UNIT/ML injection Patient injects 25 units in the AM and 10 units in the PM. Reduce each insulin dose by 2 units if glucose persistently <100 (Patient taking differently: Inject 5-20 Units into the skin See admin instructions. Patient injects 20 units in the AM and 5 units in the PM. Reduce each insulin dose by 2 units if glucose persistently <100) 10 mL    Lancets (ONETOUCH ULTRASOFT) lancets Check blood sugar 3 times daily-[id number] 300 each 1   levothyroxine (SYNTHROID) 125 MCG tablet TAKE 1 TABLET BY MOUTH DAILY ON AN EMPTY STOMACH WITH ONLY WATER FOR 30 MINUTES& NO ANTACID MEDS, CALCIUM OR MAGNESIUM FOR 4 HOURS& AVIOD BIOTIN 90 tablet 3   nystatin cream (MYCOSTATIN) Apply 1 Application topically 2 (two) times daily. 30 g 2   ONETOUCH ULTRA test strip CHECK BLOOD SUGAR 3 TIMES A DAY 300 strip 3   polymyxin B 500,000 Units in sodium chloride irrigation 0.9 % 500 mL Irrigate with 1 Application as directed every 30 (thirty) days. Eye Injection     rosuvastatin (CRESTOR) 5 MG tablet Take 1 tablet once weekly as directed for Cholesterol LDL goal less than 70. 13 tablet 3  traMADol (ULTRAM) 50 MG tablet Take 1 tablet (50 mg total) by mouth every 6 (six) hours as needed for moderate pain. 15 tablet 0   zinc gluconate 50 MG tablet Take 50 mg by mouth daily.     No current facility-administered medications on file prior to  visit.   Health Maintenance:   Immunization History  Administered Date(s) Administered   DT (Pediatric) 06/19/2015   DTaP 12/09/2004   PFIZER Comirnaty(Gray Top)Covid-19 Tri-Sucrose Vaccine 04/18/2021   PFIZER(Purple Top)SARS-COV-2 Vaccination 02/03/2020, 02/28/2020, 10/09/2020   Pfizer Covid-19 Vaccine Bivalent Booster 69yr & up 02/17/2022   Pneumococcal Conjugate-13 12/19/2014   Pneumococcal Polysaccharide-23 03/02/2009, 01/17/2019   Health Maintenance  Topic Date Due   Diabetic kidney evaluation - Urine ACR  08/08/2022   COVID-19 Vaccine (6 - 2023-24 season) 03/21/2023 (Originally 08/22/2022)   INFLUENZA VACCINE  03/22/2023 (Originally 07/22/2022)   Zoster Vaccines- Shingrix (1 of 2) 06/05/2023 (Originally 03/27/1965)   HEMOGLOBIN A1C  03/09/2023   Diabetic kidney evaluation - eGFR measurement  12/02/2023   OPHTHALMOLOGY EXAM  03/01/2024   FOOT EXAM  03/04/2024   Medicare Annual Wellness (AWV)  03/04/2024   DTaP/Tdap/Td (3 - Tdap) 06/18/2025   Pneumonia Vaccine 77 Years old  Completed   DEXA SCAN  Completed   Hepatitis C Screening  Completed   HPV VACCINES  Aged Out   COLONOSCOPY (Pts 45-426yrInsurance coverage will need to be confirmed)  Discontinued     Eye exam Dr. MaZigmund Danielees q6w for injections, had diabetes eye report Dr. GrKaty Fitch/2024 Dental: Dr. AgCristi Loronlast exam a few years ago, 02/2023   Foot exam: 08/04/2019   Patient Care Team: McUnk PintoMD as PCP - General (Internal Medicine) GrWarden FillersMD as Consulting Physician (Optometry) MaHayden PedroMD as Consulting Physician (Ophthalmology) KaInda CastleMD (Inactive) as Consulting Physician (Gastroenterology) LuDruscilla BrownieMD as Consulting Physician (Dermatology) HaAllyn KennerMD (Dermatology)   Medical History:  Past Medical History:  Diagnosis Date   Allergic rhinitis    Allergy    Anxiety    with medical procedures   Hyperlipidemia    Hypertension    Obesity    OSA  (obstructive sleep apnea)    wears CPAP   Type II or unspecified type diabetes mellitus with renal manifestations, not stated as uncontrolled(250.40)    Unspecified hypothyroidism    Vitamin D deficiency    Allergies Allergies  Allergen Reactions   Penicillins Swelling   Sulfa Antibiotics Itching and Rash    SURGICAL HISTORY She  has a past surgical history that includes  eye procedure; Tubal ligation; Dental examination under anesthesia; laparoscopy (N/A, 07/24/2022); laparotomy (N/A, 07/24/2022); Bowel resection (N/A, 07/24/2022); Umbilical hernia repair (N/A, 07/24/2022); and Endovenous ablation saphenous vein w/ laser (Left, 12/31/2022). FAMILY HISTORY Her family history includes Breast cancer in her mother; Diabetes in her maternal grandmother and sister; Hypertension in her sister; Renal Disease in her mother. SOCIAL HISTORY She  reports that she has never smoked. She has never used smokeless tobacco. She reports that she does not currently use alcohol. She reports that she does not use drugs.  MEDICARE WELLNESS OBJECTIVES: Physical activity: Current Exercise Habits: The patient does not participate in regular exercise at present, Exercise limited by: orthopedic condition(s) Cardiac risk factors: Cardiac Risk Factors include: advanced age (>5538m >65>66men);diabetes mellitus;dyslipidemia;hypertension;obesity (BMI >30kg/m2);sedentary lifestyle;microalbuminuria Depression/mood screen:      03/05/2023   10:23 AM  Depression screen PHQ 2/9  Decreased Interest 0  Down, Depressed, Hopeless 0  PHQ - 2 Score 0    ADLs:     03/05/2023   10:23 AM 07/24/2022    5:00 PM  In your present state of health, do you have any difficulty performing the following activities:  Hearing? 0 0  Vision? 0 0  Difficulty concentrating or making decisions? 0 0  Walking or climbing stairs? 1 0  Dressing or bathing? 0 0  Doing errands, shopping? 0 0     Cognitive Testing  Alert? Yes  Normal  Appearance?Yes  Oriented to person? Yes  Place? Yes   Time? Yes  Recall of three objects?  Yes  Can perform simple calculations? Yes  Displays appropriate judgment?Yes  Can read the correct time from a watch face?Yes  EOL planning: Does Patient Have a Medical Advance Directive?: Yes Type of Advance Directive: [place] Does patient want to make changes to medical advance directive?: No - Patient declined Copy of [place] in Chart?: No - copy requested   Review of Systems  Constitutional: Negative.  Negative for malaise/fatigue and weight loss.  HENT: Negative.  Negative for hearing loss and tinnitus.   Eyes: Negative.  Negative for blurred vision and double vision.  Respiratory:  Negative for cough, shortness of breath and wheezing.   Cardiovascular: Negative.  Negative for chest pain, palpitations, orthopnea, claudication and leg swelling.  Gastrointestinal: Negative.  Negative for abdominal pain, blood in stool, constipation, diarrhea, heartburn, melena, nausea and vomiting.  Genitourinary: Negative.  Negative for dysuria, flank pain, frequency, hematuria and urgency.  Musculoskeletal:  Positive for joint pain (bilateral knee pain). Negative for myalgias.  Skin: Negative.  Negative for rash.  Neurological: Negative.  Negative for dizziness, tingling, sensory change, weakness and headaches.  Endo/Heme/Allergies: Negative.  Negative for polydipsia.  Psychiatric/Behavioral: Negative.  Negative for depression, hallucinations, memory loss, substance abuse and suicidal ideas. The patient is not nervous/anxious and does not have insomnia.   All other systems reviewed and are negative.   Physical Exam: Estimated body mass index is 40.71 kg/m as calculated from the following:   Height as of this encounter: '5\' 2"'$  (1.575 m).   Weight as of this encounter: 222 lb 9.6 oz (101 kg). BP 132/78   Pulse 85   Temp 98.1 F (36.7 C)   Ht '5\' 2"'$  (1.575 m)    Wt 222 lb 9.6 oz (101 kg)   SpO2 98%   BMI 40.71 kg/m  General Appearance: Very pleasant obese female in no apparent distress.  Eyes: PERRLA, EOMs, conjunctiva no swelling or erythema, normal fundi and vessels.  Sinuses: No Frontal/maxillary tenderness  ENT/Mouth: Ext aud canals clear, normal light reflex with TMs without erythema, bulging. Good dentition. No erythema, swelling, or exudate on post pharynx. Tonsils not swollen or erythematous. Hearing normal.  Neck: Supple, thyroid normal. No bruits  Respiratory: Respiratory effort normal, BS equal bilaterally without rales, rhonchi, wheezing or stridor.  Cardio: RRR with normal S1/S2, without rubs or gallops. Brisk peripheral pulses with mild edema.  Chest: symmetric, with normal excursions and percussion.  Abdomen: Soft, nontender, no guarding, rebound, hernias, masses, or organomegaly. .  Lymphatics: Non tender without lymphadenopathy.  Musculoskeletal: Full ROM all peripheral extremities, 5/5 strength, and antalgic gait.  Skin: Vitiligo bilateral hands and feet. Warm, dry without rashes, lesions,  noted. Neuro: Cranial nerves intact, reflexes equal bilaterally. Normal muscle tone, no cerebellar symptoms. Sensation intact with monofilament Psych: Awake and oriented X 3, normal affect, Insight and Judgment appropriate.  Medicare Attestation I have personally reviewed: The patient's medical and social history Their use of alcohol, tobacco or illicit drugs Their current medications and supplements The patient's functional ability including ADLs,fall risks, home safety risks, cognitive, and hearing and visual impairment Diet and physical activities Evidence for depression or mood disorders  The patient's weight, height, BMI, and visual acuity have been recorded in the chart.  I have made referrals, counseling, and provided education to the patient based on review of the above and I have provided the patient with a written personalized  care plan for preventive services.    Magda Bernheim ANP-C  [facility]  03/05/2023

## 2023-03-05 ENCOUNTER — Encounter: Payer: Self-pay | Admitting: Nurse Practitioner

## 2023-03-05 ENCOUNTER — Ambulatory Visit (INDEPENDENT_AMBULATORY_CARE_PROVIDER_SITE_OTHER): Payer: Medicare Other | Admitting: Nurse Practitioner

## 2023-03-05 VITALS — BP 132/78 | HR 85 | Temp 98.1°F | Ht 62.0 in | Wt 222.6 lb

## 2023-03-05 DIAGNOSIS — G4733 Obstructive sleep apnea (adult) (pediatric): Secondary | ICD-10-CM

## 2023-03-05 DIAGNOSIS — E039 Hypothyroidism, unspecified: Secondary | ICD-10-CM

## 2023-03-05 DIAGNOSIS — I10 Essential (primary) hypertension: Secondary | ICD-10-CM

## 2023-03-05 DIAGNOSIS — H3530 Unspecified macular degeneration: Secondary | ICD-10-CM

## 2023-03-05 DIAGNOSIS — N183 Chronic kidney disease, stage 3 unspecified: Secondary | ICD-10-CM

## 2023-03-05 DIAGNOSIS — R6889 Other general symptoms and signs: Secondary | ICD-10-CM

## 2023-03-05 DIAGNOSIS — L97321 Non-pressure chronic ulcer of left ankle limited to breakdown of skin: Secondary | ICD-10-CM

## 2023-03-05 DIAGNOSIS — N1832 Chronic kidney disease, stage 3b: Secondary | ICD-10-CM

## 2023-03-05 DIAGNOSIS — Z0000 Encounter for general adult medical examination without abnormal findings: Secondary | ICD-10-CM

## 2023-03-05 DIAGNOSIS — N2581 Secondary hyperparathyroidism of renal origin: Secondary | ICD-10-CM | POA: Diagnosis not present

## 2023-03-05 DIAGNOSIS — E559 Vitamin D deficiency, unspecified: Secondary | ICD-10-CM

## 2023-03-05 DIAGNOSIS — E11319 Type 2 diabetes mellitus with unspecified diabetic retinopathy without macular edema: Secondary | ICD-10-CM | POA: Diagnosis not present

## 2023-03-05 DIAGNOSIS — Z6841 Body Mass Index (BMI) 40.0 and over, adult: Secondary | ICD-10-CM

## 2023-03-05 DIAGNOSIS — E785 Hyperlipidemia, unspecified: Secondary | ICD-10-CM

## 2023-03-05 DIAGNOSIS — E66813 Obesity, class 3: Secondary | ICD-10-CM

## 2023-03-05 DIAGNOSIS — I872 Venous insufficiency (chronic) (peripheral): Secondary | ICD-10-CM

## 2023-03-05 DIAGNOSIS — Z79899 Other long term (current) drug therapy: Secondary | ICD-10-CM

## 2023-03-05 DIAGNOSIS — Z0001 Encounter for general adult medical examination with abnormal findings: Secondary | ICD-10-CM | POA: Diagnosis not present

## 2023-03-05 DIAGNOSIS — E1169 Type 2 diabetes mellitus with other specified complication: Secondary | ICD-10-CM

## 2023-03-05 DIAGNOSIS — M1A9XX0 Chronic gout, unspecified, without tophus (tophi): Secondary | ICD-10-CM

## 2023-03-05 DIAGNOSIS — E1122 Type 2 diabetes mellitus with diabetic chronic kidney disease: Secondary | ICD-10-CM

## 2023-03-05 DIAGNOSIS — D649 Anemia, unspecified: Secondary | ICD-10-CM

## 2023-03-05 DIAGNOSIS — Z794 Long term (current) use of insulin: Secondary | ICD-10-CM

## 2023-03-05 NOTE — Patient Instructions (Signed)

## 2023-03-06 LAB — COMPLETE METABOLIC PANEL WITH GFR
AG Ratio: 1.3 (calc) (ref 1.0–2.5)
ALT: 13 U/L (ref 6–29)
AST: 16 U/L (ref 10–35)
Albumin: 4 g/dL (ref 3.6–5.1)
Alkaline phosphatase (APISO): 86 U/L (ref 37–153)
BUN/Creatinine Ratio: 18 (calc) (ref 6–22)
BUN: 22 mg/dL (ref 7–25)
CO2: 31 mmol/L (ref 20–32)
Calcium: 9.7 mg/dL (ref 8.6–10.4)
Chloride: 103 mmol/L (ref 98–110)
Creat: 1.2 mg/dL — ABNORMAL HIGH (ref 0.60–1.00)
Globulin: 3.2 g/dL (calc) (ref 1.9–3.7)
Glucose, Bld: 77 mg/dL (ref 65–99)
Potassium: 4.4 mmol/L (ref 3.5–5.3)
Sodium: 143 mmol/L (ref 135–146)
Total Bilirubin: 0.7 mg/dL (ref 0.2–1.2)
Total Protein: 7.2 g/dL (ref 6.1–8.1)
eGFR: 47 mL/min/{1.73_m2} — ABNORMAL LOW (ref >=60)

## 2023-03-06 LAB — LIPID PANEL
Cholesterol: 111 mg/dL (ref <200)
HDL: 38 mg/dL — ABNORMAL LOW (ref >=50)
LDL Cholesterol (Calc): 54 mg/dL (calc)
Non-HDL Cholesterol (Calc): 73 mg/dL (calc) (ref <130)
Total CHOL/HDL Ratio: 2.9 (calc) (ref <5.0)
Triglycerides: 100 mg/dL (ref <150)

## 2023-03-06 LAB — CBC WITH DIFFERENTIAL/PLATELET
Absolute Monocytes: 696 cells/uL (ref 200–950)
Basophils Absolute: 78 cells/uL (ref 0–200)
Basophils Relative: 0.9 %
Eosinophils Absolute: 244 cells/uL (ref 15–500)
Eosinophils Relative: 2.8 %
HCT: 34.9 % — ABNORMAL LOW (ref 35.0–45.0)
Hemoglobin: 11.4 g/dL — ABNORMAL LOW (ref 11.7–15.5)
Lymphs Abs: 3089 cells/uL (ref 850–3900)
MCH: 28.4 pg (ref 27.0–33.0)
MCHC: 32.7 g/dL (ref 32.0–36.0)
MCV: 87 fL (ref 80.0–100.0)
MPV: 12 fL (ref 7.5–12.5)
Monocytes Relative: 8 %
Neutro Abs: 4594 cells/uL (ref 1500–7800)
Neutrophils Relative %: 52.8 %
Platelets: 408 10*3/uL — ABNORMAL HIGH (ref 140–400)
RBC: 4.01 10*6/uL (ref 3.80–5.10)
RDW: 15.3 % — ABNORMAL HIGH (ref 11.0–15.0)
Total Lymphocyte: 35.5 %
WBC: 8.7 10*3/uL (ref 3.8–10.8)

## 2023-03-06 LAB — HEMOGLOBIN A1C
Hgb A1c MFr Bld: 5 % of total Hgb (ref <5.7)
Mean Plasma Glucose: 97 mg/dL
eAG (mmol/L): 5.4 mmol/L

## 2023-03-06 LAB — TSH: TSH: 0.11 mIU/L — ABNORMAL LOW (ref 0.40–4.50)

## 2023-03-06 LAB — PTH, INTACT AND CALCIUM
Calcium: 9.7 mg/dL (ref 8.6–10.4)
PTH: 97 pg/mL — ABNORMAL HIGH (ref 16–77)

## 2023-03-06 LAB — MICROALBUMIN / CREATININE URINE RATIO
Creatinine, Urine: 60 mg/dL (ref 20–275)
Microalb Creat Ratio: 10 mcg/mg creat (ref <30)
Microalb, Ur: 0.6 mg/dL

## 2023-03-16 ENCOUNTER — Encounter (INDEPENDENT_AMBULATORY_CARE_PROVIDER_SITE_OTHER): Payer: Medicare Other | Admitting: Ophthalmology

## 2023-03-16 DIAGNOSIS — I10 Essential (primary) hypertension: Secondary | ICD-10-CM

## 2023-03-16 DIAGNOSIS — H43813 Vitreous degeneration, bilateral: Secondary | ICD-10-CM | POA: Diagnosis not present

## 2023-03-16 DIAGNOSIS — H35033 Hypertensive retinopathy, bilateral: Secondary | ICD-10-CM

## 2023-03-16 DIAGNOSIS — H34812 Central retinal vein occlusion, left eye, with macular edema: Secondary | ICD-10-CM

## 2023-03-25 ENCOUNTER — Other Ambulatory Visit: Payer: Self-pay | Admitting: Nurse Practitioner

## 2023-03-25 ENCOUNTER — Encounter: Payer: Self-pay | Admitting: Vascular Surgery

## 2023-03-25 ENCOUNTER — Ambulatory Visit (INDEPENDENT_AMBULATORY_CARE_PROVIDER_SITE_OTHER): Payer: Medicare Other | Admitting: Vascular Surgery

## 2023-03-25 VITALS — BP 143/86 | HR 90 | Temp 98.4°F | Resp 14 | Ht 62.0 in | Wt 224.0 lb

## 2023-03-25 DIAGNOSIS — L97228 Non-pressure chronic ulcer of left calf with other specified severity: Secondary | ICD-10-CM

## 2023-03-25 DIAGNOSIS — I872 Venous insufficiency (chronic) (peripheral): Secondary | ICD-10-CM | POA: Diagnosis not present

## 2023-03-25 DIAGNOSIS — N1832 Chronic kidney disease, stage 3b: Secondary | ICD-10-CM

## 2023-03-25 DIAGNOSIS — I83022 Varicose veins of left lower extremity with ulcer of calf: Secondary | ICD-10-CM | POA: Diagnosis not present

## 2023-03-25 NOTE — Progress Notes (Signed)
REASON FOR VISIT:   Follow-up of chronic venous insufficiency  MEDICAL ISSUES:   CHRONIC VENOUS INSUFFICIENCY: The patient's wounds on the left leg have healed.  She is not having significant symptoms from venous hypertension.  For this reason at this point I do not think laser ablation of the left great saphenous vein in the thigh is necessary.  We have again discussed conservative measures.  She does elevate her legs daily and wears knee-high compression stockings.  I encouraged her to stay as active as possible.  I will see her back as needed.  HPI:   Jocelyn Camacho is a pleasant 77 y.o. female who I last saw on 01/07/2023.  She had CEAP C6r venous disease (recurrent venous ulcer left leg).  She did not have any evidence of arterial insufficiency.  She did not have any significant deep venous reflux.  She underwent laser ablation of the left small saphenous vein on 12/31/2022.  She had reflux in the great saphenous vein on the left in the proximal thigh only.  I felt that if the wound in the left leg did not heal we could consider laser ablation of the left great saphenous vein.  She comes in for a 64-month follow-up visit.  Since I saw her last, the wounds on her left leg have healed.  She denies significant aching pain and heaviness in either lower extremity.  She does wear her knee-high compression stockings.  She does elevate her legs daily.  Past Medical History:  Diagnosis Date   Allergic rhinitis    Allergy    Anxiety    with medical procedures   Hyperlipidemia    Hypertension    Obesity    OSA (obstructive sleep apnea)    wears CPAP   Type II or unspecified type diabetes mellitus with renal manifestations, not stated as uncontrolled(250.40)    Unspecified hypothyroidism    Vitamin D deficiency     Family History  Problem Relation Age of Onset   Breast cancer Mother    Renal Disease Mother    Hypertension Sister    Diabetes Sister    Diabetes Maternal Grandmother     Colon cancer Neg Hx    Esophageal cancer Neg Hx    Stomach cancer Neg Hx    Rectal cancer Neg Hx     SOCIAL HISTORY: Social History   Tobacco Use   Smoking status: Never   Smokeless tobacco: Never  Substance Use Topics   Alcohol use: Not Currently    Comment: very rarely    Allergies  Allergen Reactions   Penicillins Swelling   Sulfa Antibiotics Itching and Rash    Current Outpatient Medications  Medication Sig Dispense Refill   acetaminophen (TYLENOL) 650 MG CR tablet Take 1,300 mg by mouth every 8 (eight) hours as needed for pain.     allopurinol (ZYLOPRIM) 300 MG tablet TAKE 1/2 TABLET(150 MG) BY MOUTH DAILY FOR GOUT PREVENTION 45 tablet 2   Ascorbic Acid (VITAMIN C) 1000 MG tablet Take 1,000 mg by mouth daily.     aspirin EC 81 MG tablet Take 81 mg by mouth daily.     atenolol (TENORMIN) 100 MG tablet Take 1 tablet by mouth once daily for blood pressure 90 tablet 0   Cholecalciferol (VITAMIN D3) 5000 units CAPS Take 5,000 Units by mouth daily.     colchicine 0.6 MG tablet Take  1 tablet  Daily  to Prevent Gout                                                                 /  TAKE                     BY                     MOUTH (Patient taking differently: Take 0.6 mg by mouth daily. Prevent Gout) 90 tablet 3   Cyanocobalamin (B-12) 1000 MCG SUBL Place 1,000 mcg under the tongue 2 (two) times a week. Saturday and Sunday     dorzolamide-timolol (COSOPT) 22.3-6.8 MG/ML ophthalmic solution Place 1 drop into the left eye 2 (two) times daily.     Finerenone (KERENDIA) 10 MG TABS Take 10 mg by mouth daily. Take  1 tablet  Daily  for Diabetic Kidneys (Patient taking differently: Take 10 mg by mouth daily. Diabetic Kidneys) 90 tablet 0   furosemide (LASIX) 80 MG tablet TAKE 1 TABLET BY MOUTH 1 TO 2 TIMES PER DAY AS NEEDED FOR FLUID RETENTION OR ANKLE SWELLING 180 tablet 3   hyoscyamine (LEVSIN, ANASPAZ) 0.125 MG tablet Take 1 tablet (0.125 mg total) by mouth  every 6 (six) hours as needed. (Patient taking differently: Take 0.125 mg by mouth every 6 (six) hours as needed for bladder spasms.) 60 tablet 1   insulin NPH-regular Human (70-30) 100 UNIT/ML injection Patient injects 25 units in the AM and 10 units in the PM. Reduce each insulin dose by 2 units if glucose persistently <100 (Patient taking differently: Inject 5-20 Units into the skin See admin instructions. Patient injects 20 units in the AM and 5 units in the PM. Reduce each insulin dose by 2 units if glucose persistently <100) 10 mL    Lancets (ONETOUCH ULTRASOFT) lancets Check blood sugar 3 times daily-DX-E10.9 300 each 1   levothyroxine (SYNTHROID) 125 MCG tablet TAKE 1 TABLET BY MOUTH DAILY ON AN EMPTY STOMACH WITH ONLY WATER FOR 30 MINUTES& NO ANTACID MEDS, CALCIUM OR MAGNESIUM FOR 4 HOURS& AVIOD BIOTIN 90 tablet 3   nystatin cream (MYCOSTATIN) Apply 1 Application topically 2 (two) times daily. 30 g 2   ONETOUCH ULTRA test strip CHECK BLOOD SUGAR 3 TIMES A DAY 300 strip 3   polymyxin B 500,000 Units in sodium chloride irrigation 0.9 % 500 mL Irrigate with 1 Application as directed every 30 (thirty) days. Eye Injection     rosuvastatin (CRESTOR) 5 MG tablet Take 1 tablet once weekly as directed for Cholesterol LDL goal less than 70. 13 tablet 3   traMADol (ULTRAM) 50 MG tablet Take 1 tablet (50 mg total) by mouth every 6 (six) hours as needed for moderate pain. 15 tablet 0   TRULICITY 3 0000000 SOPN INJECT 3 MG  SUBCUTANEOUSLY ONCE A WEEK FOR DIABETES 12 mL 0   zinc gluconate 50 MG tablet Take 50 mg by mouth daily.     No current facility-administered medications for this visit.    REVIEW OF SYSTEMS:  [X]  denotes positive finding, [ ]  denotes negative finding Cardiac  Comments:  Chest pain or chest pressure:    Shortness of breath upon exertion:    Short of breath when lying flat:    Irregular heart rhythm:        Vascular    Pain in calf, thigh, or hip brought on by ambulation:     Pain in feet at night that wakes you up from your sleep:     Blood clot in your veins:    Leg swelling:  x       Pulmonary    Oxygen  at home:    Productive cough:     Wheezing:         Neurologic    Sudden weakness in arms or legs:     Sudden numbness in arms or legs:     Sudden onset of difficulty speaking or slurred speech:    Temporary loss of vision in one eye:     Problems with dizziness:         Gastrointestinal    Blood in stool:     Vomited blood:         Genitourinary    Burning when urinating:     Blood in urine:        Psychiatric    Major depression:         Hematologic    Bleeding problems:    Problems with blood clotting too easily:        Skin    Rashes or ulcers:        Constitutional    Fever or chills:     PHYSICAL EXAM:   Vitals:   03/25/23 1304  Resp: 14  Weight: 224 lb (101.6 kg)  Height: 5\' 2"  (1.575 m)    GENERAL: The patient is a well-nourished female, in no acute distress. The vital signs are documented above. CARDIAC: There is a regular rate and rhythm.  VASCULAR: Both feet are warm and well-perfused. The wound on the left leg are healed. PULMONARY: There is good air exchange bilaterally without wheezing or rales. MUSCULOSKELETAL: There are no major deformities or cyanosis. NEUROLOGIC: No focal weakness or paresthesias are detected. SKIN: There are no ulcers or rashes noted. PSYCHIATRIC: The patient has a normal affect.  DATA:    No new data.  Deitra Mayo [facility] [phone]

## 2023-03-27 ENCOUNTER — Telehealth: Payer: Self-pay | Admitting: Nurse Practitioner

## 2023-03-27 NOTE — Telephone Encounter (Signed)
Patient is requesting a refill on Trulicity to [facility] on [address]

## 2023-03-27 NOTE — Telephone Encounter (Signed)
I sent the prescription in for that 03/25/23 can she see if they have it

## 2023-04-20 ENCOUNTER — Encounter (INDEPENDENT_AMBULATORY_CARE_PROVIDER_SITE_OTHER): Payer: Medicare Other | Admitting: Ophthalmology

## 2023-04-20 DIAGNOSIS — H35033 Hypertensive retinopathy, bilateral: Secondary | ICD-10-CM

## 2023-04-20 DIAGNOSIS — I10 Essential (primary) hypertension: Secondary | ICD-10-CM | POA: Diagnosis not present

## 2023-04-20 DIAGNOSIS — H34812 Central retinal vein occlusion, left eye, with macular edema: Secondary | ICD-10-CM

## 2023-04-20 DIAGNOSIS — H43813 Vitreous degeneration, bilateral: Secondary | ICD-10-CM | POA: Diagnosis not present

## 2023-04-23 ENCOUNTER — Other Ambulatory Visit: Payer: Self-pay | Admitting: Nurse Practitioner

## 2023-05-05 ENCOUNTER — Telehealth: Payer: Self-pay | Admitting: Nurse Practitioner

## 2023-05-05 ENCOUNTER — Other Ambulatory Visit: Payer: Self-pay | Admitting: Nurse Practitioner

## 2023-05-05 DIAGNOSIS — E1169 Type 2 diabetes mellitus with other specified complication: Secondary | ICD-10-CM

## 2023-05-05 MED ORDER — ROSUVASTATIN CALCIUM 5 MG PO TABS
ORAL_TABLET | ORAL | 3 refills | Status: DC
Start: 2023-05-05 — End: 2024-03-18

## 2023-05-05 NOTE — Telephone Encounter (Signed)
It does appear she should be taking 1 tab weekly and I sent in the prescription to reflect that dosage

## 2023-05-05 NOTE — Telephone Encounter (Signed)
Pt is requesting a refill on Rosuvastatin but it is no longer on her med list. She said Morrie Sheldon had her on it and told her to take it only 1x a week but instructions on it say daily.  Can someone call her and clarify what she needs to take

## 2023-05-25 ENCOUNTER — Encounter (INDEPENDENT_AMBULATORY_CARE_PROVIDER_SITE_OTHER): Payer: Medicare Other | Admitting: Ophthalmology

## 2023-05-25 DIAGNOSIS — I10 Essential (primary) hypertension: Secondary | ICD-10-CM | POA: Diagnosis not present

## 2023-05-25 DIAGNOSIS — H43813 Vitreous degeneration, bilateral: Secondary | ICD-10-CM

## 2023-05-25 DIAGNOSIS — H34812 Central retinal vein occlusion, left eye, with macular edema: Secondary | ICD-10-CM

## 2023-05-25 DIAGNOSIS — H35033 Hypertensive retinopathy, bilateral: Secondary | ICD-10-CM | POA: Diagnosis not present

## 2023-06-10 NOTE — Progress Notes (Unsigned)
3 MONTH FOLLOW UP  Assessment and Plan: Essential hypertension Continue current medications: Monitor blood pressure at home; call if consistently over 130/80 Continue DASH diet.   Reminder to go to the ER if any CP, SOB, nausea, dizziness, severe HA, changes vision/speech, left arm numbness and tingling and jaw pain. - CMP/GFR -CBC  OSA (obstructive sleep apnea) Weight loss advised, needs to wear nightly, may benefit from new mask/titration   Hypothyroidism, unspecified hypothyroidism type Taking levothyroxine daily Reminder to take on an empty stomach 30-71mins before first meal of the day. No antacid medications for 4 hours. 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 Continue to monitor   Hyperlipidemia associated with T2DM (HCC) Continue medications:Losartan 100mg  Discussed dietary and exercise modifications Low fat diet - Lipid panel   Vitamin D deficiency Continue supplementation to maintain goal of 70-100 Taking Vitamin D 5,00IU, 2 tabs daily Defer vitamin D level  Type 2 diabetes mellitus with Obesity, morbid Obesity with co morbidities- long discussion about weight loss, diet, and exercise  Medication management - Magnesium  CKD stage 3 due to type 2 diabetes mellitus Follows with nephrology, next OV two weeks Increase fluids, avoid NSAIDS, monitor sugars, will monitor  - CMP/GFR  Anxiety Will take valium prior to procedures but would like to be able to take AS needed for sleep, long discussion about addictive nature and tolerance, will only take PRN.   Macular degeneration, unspecified laterality, unspecified type  continue follow up eye doctor  Chronic gout without tophus, unspecified cause, unspecified site Gout- recheck Uric acid today , Diet discussed, continue medications.  Type 2 diabetes mellitus with hyperlipidemia (HCC) Continue 70/30, 25untis in am and 10unit PM. Rx Ozempic for glucose and added weight benefit. Discussed general  issues about diabetes pathophysiology and management., Educational material distributed., Suggested low cholesterol diet., Encouraged aerobic exercise., Discussed foot care., Reminded to get yearly retinal exam. -     Hemoglobin A1c     Further disposition pending results if labs check today. Discussed med's effects and SE's.   Over 30 minutes of face to face interview, exam, counseling, chart review, and critical decision making was performed.    Discussed med's effects and SE's. Screening labs and tests as requested with regular follow-up as recommended. Future Appointments  Date Time Provider Department Center  06/11/2023 11:30 AM Raynelle Dick, NP [facility] None  06/29/2023  8:20 AM Sherrie George, MD [facility] None  11/11/2023 11:00 AM Lomax, Amy, NP [facility] None    HPI  77 y.o. AA female  presents for medicare wellness and 3 month follow up.   She has hx of venous stasis, recurrent LE ulcers, reports today has L shin ulcer. Has been applying betadine/sugar slurry.  Reports the area is tender  and husband does the dressing. Has not changed size.   She is getting shots for Mac Degen with Dr. Danne Harbor, she is prescribed valium to take prior to eye injections.   She has OSA and had sleep study a few months ago and has been sleeping with CPAP every night   She is following with ortho Dr. Althea Charon at [facility] for bilateral knee pain and has been advised may need TKA at some point. She had injections 6-8 months ago and did have relief until recently.  She will take tylenol only if needed, uses OTC topicals and takes tumeric/bioprene supplement daily. Reports that she is having and increase in discomfort that is constant and movement makes it worse, bilaterally.  She has not noticed any swelling but reports she can feel some tightness in the right knee.  She does use a cane for ambulation but left it in the car today.  BMI is There is no height or weight on file to calculate  BMI., she has been working on diet, reducing white items/starches, and sweets. Exercise is admittedly limited due to knee.  Wt Readings from Last 3 Encounters:  03/25/23 224 lb (101.6 kg)  03/05/23 222 lb 9.6 oz (101 kg)  01/07/23 218 lb (98.9 kg)   Her blood pressure has been controlled at home, today their BP is    BP Readings from Last 3 Encounters:  03/25/23 (!) 143/86  03/05/23 132/78  01/07/23 138/78    She does not workout due to knee pain.  She denies chest pain, shortness of breath, dizziness.   She is on cholesterol medication and denies myalgias. Her cholesterol is at goal. The cholesterol last visit was:   Lab Results  Component Value Date   CHOL 111 03/05/2023   HDL 38 (L) 03/05/2023   LDLCALC 54 03/05/2023   TRIG 100 03/05/2023   CHOLHDL 2.9 03/05/2023   She has had diabetes for 3 years. She has been working on diet and exercise for Diabetes with CKD stage III, she is on ACE, she is on bASA, She is off metformin due to kidneys, she is on 70/30 insulin 20 units and 5-8 units and Trulicity 3 mg QW denies paresthesia of the feet, polydipsia and polyuria. She is checking her sugars, fasting runs in 100's, evening is 90- 140, one outlier of 300. Checks sugars twice a day with one touch ultra. Denies any hypoglycemia. Last A1C in the office was:  Lab Results  Component Value Date   HGBA1C 5.0 03/05/2023   Follows with Nephrology, next OV in two weeks. She has mild edema since reducing dose, typically well managed by compression.  Lab Results  Component Value Date   GFRAA 44 (L) 03/04/2021   Patient is on Vitamin D supplement.   Lab Results  Component Value Date   VD25OH 40 07/23/2022     She is on thyroid medication. Her medication was not changed last visit. She is on 125 mcg daily. She takes it 30 mins before food with just water.  Lab Results  Component Value Date   TSH 0.11 (L) 03/05/2023   Patient is on allopurinol for gout and does not report a recent flare.   Lab Results  Component Value Date   LABURIC 5.9 11/11/2021     Current Medications:  Current Outpatient Medications on File Prior to Visit  Medication Sig Dispense Refill   acetaminophen (TYLENOL) 650 MG CR tablet Take 1,300 mg by mouth every 8 (eight) hours as needed for pain.     allopurinol (ZYLOPRIM) 300 MG tablet TAKE 1/2 TABLET(150 MG) BY MOUTH DAILY FOR GOUT PREVENTION 45 tablet 2   Ascorbic Acid (VITAMIN C) 1000 MG tablet Take 1,000 mg by mouth daily.     aspirin EC 81 MG tablet Take 81 mg by mouth daily.     atenolol (TENORMIN) 100 MG tablet Take 1 tablet by mouth once daily for blood pressure 90 tablet 0   Cholecalciferol (VITAMIN D3) 5000 units CAPS Take 5,000 Units by mouth daily.     colchicine 0.6 MG tablet Take  1 tablet  Daily  to Prevent Gout                                                                 /  TAKE                     BY                     MOUTH (Patient taking differently: Take 0.6 mg by mouth daily. Prevent Gout) 90 tablet 3   Cyanocobalamin (B-12) 1000 MCG SUBL Place 1,000 mcg under the tongue 2 (two) times a week. Saturday and Sunday     dorzolamide-timolol (COSOPT) 22.3-6.8 MG/ML ophthalmic solution Place 1 drop into the left eye 2 (two) times daily.     Finerenone (KERENDIA) 10 MG TABS Take 10 mg by mouth daily. Take  1 tablet  Daily  for Diabetic Kidneys (Patient taking differently: Take 10 mg by mouth daily. Diabetic Kidneys) 90 tablet 0   furosemide (LASIX) 80 MG tablet TAKE 1 TABLET BY MOUTH 1 TO 2 TIMES PER DAY AS NEEDED FOR FLUID RETENTION OR ANKLE SWELLING 180 tablet 3   hyoscyamine (LEVSIN, ANASPAZ) 0.125 MG tablet Take 1 tablet (0.125 mg total) by mouth every 6 (six) hours as needed. (Patient taking differently: Take 0.125 mg by mouth every 6 (six) hours as needed for bladder spasms.) 60 tablet 1   insulin NPH-regular Human (70-30) 100 UNIT/ML injection Patient injects 25 units in the AM and 10 units in the PM. Reduce each  insulin dose by 2 units if glucose persistently <100 (Patient taking differently: Inject 5-20 Units into the skin See admin instructions. Patient injects 20 units in the AM and 5 units in the PM. Reduce each insulin dose by 2 units if glucose persistently <100) 10 mL    Lancets (ONETOUCH ULTRASOFT) lancets Check blood sugar 3 times daily-DX-E10.9 300 each 1   levothyroxine (SYNTHROID) 125 MCG tablet TAKE 1 TABLET BY MOUTH DAILY ON AN EMPTY STOMACH WITH ONLY WATER FOR 30 MINUTES& NO ANTACID MEDS, CALCIUM OR MAGNESIUM FOR 4 HOURS& AVIOD BIOTIN 90 tablet 3   nystatin cream (MYCOSTATIN) Apply 1 Application topically 2 (two) times daily. 30 g 2   ONETOUCH ULTRA test strip CHECK BLOOD SUGAR 3 TIMES A DAY 300 strip 3   polymyxin B 500,000 Units in sodium chloride irrigation 0.9 % 500 mL Irrigate with 1 Application as directed every 30 (thirty) days. Eye Injection     rosuvastatin (CRESTOR) 5 MG tablet Take 1 tablet once weekly as directed for Cholesterol LDL goal less than 70. 13 tablet 3   traMADol (ULTRAM) 50 MG tablet Take 1 tablet (50 mg total) by mouth every 6 (six) hours as needed for moderate pain. 15 tablet 0   TRULICITY 3 MG/0.5ML SOPN INJECT 3 MG  SUBCUTANEOUSLY ONCE A WEEK FOR DIABETES 12 mL 0   zinc gluconate 50 MG tablet Take 50 mg by mouth daily.     No current facility-administered medications on file prior to visit.   Health Maintenance:   Immunization History  Administered Date(s) Administered   DT (Pediatric) 06/19/2015   DTaP 12/09/2004   PFIZER Comirnaty(Gray Top)Covid-19 Tri-Sucrose Vaccine 04/18/2021   PFIZER(Purple Top)SARS-COV-2 Vaccination 02/03/2020, 02/28/2020, 10/09/2020   Pfizer Covid-19 Vaccine Bivalent Booster 88yrs & up 02/17/2022   Pneumococcal Conjugate-13 12/19/2014   Pneumococcal Polysaccharide-23 03/02/2009, 01/17/2019    Eye exam Dr. Ashley Royalty sees q6w for injections, had diabetes eye report Dr. Dione Booze 12/2021 Dental: Dr. Jeanell Sparrow, last exam a few years ago, ?  2019, will schedule DUE   Foot exam: 08/04/2019   Patient Care Team: Lucky Cowboy, MD as PCP - General (Internal  Medicine) Sallye Lat, MD as Consulting Physician (Optometry) Sherrie George, MD as Consulting Physician (Ophthalmology) Louis Meckel, MD (Inactive) as Consulting Physician (Gastroenterology) Cherlyn Roberts, MD as Consulting Physician (Dermatology) Nita Sells, MD (Dermatology)   Medical History:  Past Medical History:  Diagnosis Date   Allergic rhinitis    Allergy    Anxiety    with medical procedures   Hyperlipidemia    Hypertension    Obesity    OSA (obstructive sleep apnea)    wears CPAP   Type II or unspecified type diabetes mellitus with renal manifestations, not stated as uncontrolled(250.40)    Unspecified hypothyroidism    Vitamin D deficiency    Allergies Allergies  Allergen Reactions   Penicillins Swelling   Sulfa Antibiotics Itching and Rash    SURGICAL HISTORY She  has a past surgical history that includes  eye procedure; Tubal ligation; Dental examination under anesthesia; laparoscopy (N/A, 07/24/2022); laparotomy (N/A, 07/24/2022); Bowel resection (N/A, 07/24/2022); Umbilical hernia repair (N/A, 07/24/2022); and Endovenous ablation saphenous vein w/ laser (Left, 12/31/2022). FAMILY HISTORY Her family history includes Breast cancer in her mother; Diabetes in her maternal grandmother and sister; Hypertension in her sister; Renal Disease in her mother. SOCIAL HISTORY She  reports that she has never smoked. She has never used smokeless tobacco. She reports that she does not currently use alcohol. She reports that she does not use drugs.   Review of Systems  Constitutional: Negative.  Negative for malaise/fatigue and weight loss.  HENT: Negative.  Negative for hearing loss and tinnitus.   Eyes: Negative.  Negative for blurred vision and double vision.  Respiratory:  Positive for cough. Negative for shortness of breath and  wheezing.   Cardiovascular: Negative.  Negative for chest pain, palpitations, orthopnea, claudication and leg swelling.  Gastrointestinal: Negative.  Negative for abdominal pain, blood in stool, constipation, diarrhea, heartburn, melena, nausea and vomiting.  Genitourinary: Negative.  Negative for dysuria, flank pain, frequency, hematuria and urgency.  Musculoskeletal:  Positive for joint pain (bilateral knee pain). Negative for myalgias.  Skin: Negative.  Negative for rash.  Neurological: Negative.  Negative for dizziness, tingling, sensory change, weakness and headaches.  Endo/Heme/Allergies: Negative.  Negative for polydipsia.  Psychiatric/Behavioral: Negative.  Negative for depression, hallucinations, memory loss, substance abuse and suicidal ideas. The patient is not nervous/anxious and does not have insomnia.   All other systems reviewed and are negative.   Physical Exam: Estimated body mass index is 40.97 kg/m as calculated from the following:   Height as of 03/25/23: 5\' 2"  (1.575 m).   Weight as of 03/25/23: 224 lb (101.6 kg). There were no vitals taken for this visit. General Appearance: Very pleasant obese female in no apparent distress.  Eyes: PERRLA, EOMs, conjunctiva no swelling or erythema, normal fundi and vessels.  Sinuses: No Frontal/maxillary tenderness  ENT/Mouth: Ext aud canals clear, normal light reflex with TMs without erythema, bulging. Good dentition. No erythema, swelling, or exudate on post pharynx. Tonsils not swollen or erythematous. Hearing normal.  Neck: Supple, thyroid normal. No bruits  Respiratory: Respiratory effort normal, BS equal bilaterally without rales, rhonchi, wheezing or stridor.  Cardio: RRR with normal S1/S2, without rubs or gallops. Brisk peripheral pulses with mild edema.  Chest: symmetric, with normal excursions and percussion.  Breasts: declines, states getting MGM Abdomen: Soft, nontender, no guarding, rebound, hernias, masses, or organomegaly.  .  Lymphatics: Non tender without lymphadenopathy.  Genitourinary: defer Musculoskeletal: Full ROM all peripheral extremities, 5/5 strength, and antalgic gait.  Skin:  Vitiligo bilateral hands and feet. Warm, dry without rashes, lesions, ecchymosis. She has open ulcerated wound to left shin, approx 2.5 x 1.5 cm area, breakdown to fatty layer, surrounding erythema, warm to touch, pitting noted around open area. No drainage or exudate noted. Covered with clear dressing. Neuro: Cranial nerves intact, reflexes equal bilaterally. Normal muscle tone, no cerebellar symptoms. Sensation intact.  Psych: Awake and oriented X 3, normal affect, Insight and Judgment appropriate.     Revonda Humphrey ANP-C  [facility]  06/10/2023

## 2023-06-11 ENCOUNTER — Ambulatory Visit (INDEPENDENT_AMBULATORY_CARE_PROVIDER_SITE_OTHER): Payer: Medicare Other | Admitting: Nurse Practitioner

## 2023-06-11 ENCOUNTER — Encounter: Payer: Self-pay | Admitting: Nurse Practitioner

## 2023-06-11 VITALS — BP 130/74 | HR 88 | Temp 97.5°F | Ht 62.0 in | Wt 228.8 lb

## 2023-06-11 DIAGNOSIS — N1832 Chronic kidney disease, stage 3b: Secondary | ICD-10-CM

## 2023-06-11 DIAGNOSIS — E559 Vitamin D deficiency, unspecified: Secondary | ICD-10-CM

## 2023-06-11 DIAGNOSIS — E785 Hyperlipidemia, unspecified: Secondary | ICD-10-CM

## 2023-06-11 DIAGNOSIS — E11319 Type 2 diabetes mellitus with unspecified diabetic retinopathy without macular edema: Secondary | ICD-10-CM | POA: Diagnosis not present

## 2023-06-11 DIAGNOSIS — N2581 Secondary hyperparathyroidism of renal origin: Secondary | ICD-10-CM | POA: Diagnosis not present

## 2023-06-11 DIAGNOSIS — Z79899 Other long term (current) drug therapy: Secondary | ICD-10-CM

## 2023-06-11 DIAGNOSIS — E1122 Type 2 diabetes mellitus with diabetic chronic kidney disease: Secondary | ICD-10-CM

## 2023-06-11 DIAGNOSIS — E039 Hypothyroidism, unspecified: Secondary | ICD-10-CM

## 2023-06-11 DIAGNOSIS — Z794 Long term (current) use of insulin: Secondary | ICD-10-CM

## 2023-06-11 DIAGNOSIS — R140 Abdominal distension (gaseous): Secondary | ICD-10-CM

## 2023-06-11 DIAGNOSIS — I10 Essential (primary) hypertension: Secondary | ICD-10-CM

## 2023-06-11 DIAGNOSIS — E1169 Type 2 diabetes mellitus with other specified complication: Secondary | ICD-10-CM | POA: Diagnosis not present

## 2023-06-11 DIAGNOSIS — F411 Generalized anxiety disorder: Secondary | ICD-10-CM

## 2023-06-11 DIAGNOSIS — M1A9XX0 Chronic gout, unspecified, without tophus (tophi): Secondary | ICD-10-CM

## 2023-06-11 DIAGNOSIS — N183 Chronic kidney disease, stage 3 unspecified: Secondary | ICD-10-CM

## 2023-06-11 NOTE — Patient Instructions (Signed)
Abdominal Bloating When you have abdominal bloating, your abdomen may feel full, tight, or painful. It may also look bigger than normal or swollen (distended). Common causes of abdominal bloating include: Swallowing air. Constipation. Problems digesting food. Eating too much. Irritable bowel syndrome. This is a condition that affects the large intestine. Lactose intolerance. This is an inability to digest lactose, a natural sugar in dairy products. Celiac disease. This is a condition that affects the ability to digest gluten, a protein found in some grains. Gastroparesis. This is a condition that slows down the movement of food in the stomach and small intestine. It is more common in people with diabetes mellitus. Gastroesophageal reflux disease (GERD). This is a condition that makes stomach acid flow back into the esophagus. Urinary retention. This means that the body is holding onto urine, and the bladder cannot be emptied all the way. Follow these instructions at home: Eating and drinking Avoid eating too much. Try not to swallow air while talking or eating. Avoid eating while lying down. Avoid these foods and drinks: Foods that cause gas, such as broccoli, cabbage, cauliflower, and baked beans. Carbonated drinks. Hard candy. Chewing gum. Medicines Take over-the-counter and prescription medicines only as told by your health care provider. Take probiotic medicines. These medicines contain live bacteria or yeasts that can help digestion. Take coated peppermint oil capsules. General instructions Try to exercise regularly. Exercise may help to relieve bloating that is caused by gas and relieve constipation. Keep all follow-up visits. This is important. Contact a health care provider if: You have nausea and vomiting. You have diarrhea. You have abdominal pain. You have unusual weight loss or weight gain. You have severe pain, and medicines do not help. Get help right away if: You  have chest pain. You have trouble breathing. You have shortness of breath. You have trouble urinating. You have darker urine than normal. You have blood in your stools or have dark, tarry stools. These symptoms may represent a serious problem that is an emergency. Do not wait to see if the symptoms will go away. Get medical help right away. Call your local emergency services (911 in the [place] Do not drive yourself to the hospital. Summary Abdominal bloating means that the abdomen is swollen. Common causes of abdominal bloating are swallowing air, constipation, and problems digesting food. Avoid eating too much and avoid swallowing air. Avoid foods that cause gas, carbonated drinks, hard candy, and chewing gum. This information is not intended to replace advice given to you by your health care provider. Make sure you discuss any questions you have with your health care provider. Document Revised: 07/10/2020 Document Reviewed: 07/10/2020 Elsevier Patient Education  2024 [employer]  

## 2023-06-12 LAB — CBC WITH DIFFERENTIAL/PLATELET
Absolute Monocytes: 623 cells/uL (ref 200–950)
Basophils Absolute: 57 cells/uL (ref 0–200)
Basophils Relative: 0.7 %
Eosinophils Absolute: 221 cells/uL (ref 15–500)
Eosinophils Relative: 2.7 %
HCT: 34.3 % — ABNORMAL LOW (ref 35.0–45.0)
Hemoglobin: 11.3 g/dL — ABNORMAL LOW (ref 11.7–15.5)
Lymphs Abs: 2583 cells/uL (ref 850–3900)
MCH: 28.5 pg (ref 27.0–33.0)
MCHC: 32.9 g/dL (ref 32.0–36.0)
MCV: 86.4 fL (ref 80.0–100.0)
MPV: 12 fL (ref 7.5–12.5)
Monocytes Relative: 7.6 %
Neutro Abs: 4715 cells/uL (ref 1500–7800)
Neutrophils Relative %: 57.5 %
Platelets: 370 10*3/uL (ref 140–400)
RBC: 3.97 10*6/uL (ref 3.80–5.10)
RDW: 14.7 % (ref 11.0–15.0)
Total Lymphocyte: 31.5 %
WBC: 8.2 10*3/uL (ref 3.8–10.8)

## 2023-06-12 LAB — LIPID PANEL
Cholesterol: 113 mg/dL (ref <200)
HDL: 36 mg/dL — ABNORMAL LOW (ref >=50)
LDL Cholesterol (Calc): 54 mg/dL (calc)
Non-HDL Cholesterol (Calc): 77 mg/dL (calc) (ref <130)
Total CHOL/HDL Ratio: 3.1 (calc) (ref <5.0)
Triglycerides: 148 mg/dL (ref <150)

## 2023-06-12 LAB — COMPLETE METABOLIC PANEL WITH GFR
AG Ratio: 1.5 (calc) (ref 1.0–2.5)
ALT: 11 U/L (ref 6–29)
AST: 15 U/L (ref 10–35)
Albumin: 3.9 g/dL (ref 3.6–5.1)
Alkaline phosphatase (APISO): 108 U/L (ref 37–153)
BUN/Creatinine Ratio: 18 (calc) (ref 6–22)
BUN: 21 mg/dL (ref 7–25)
CO2: 27 mmol/L (ref 20–32)
Calcium: 9.4 mg/dL (ref 8.6–10.4)
Chloride: 106 mmol/L (ref 98–110)
Creat: 1.2 mg/dL — ABNORMAL HIGH (ref 0.60–1.00)
Globulin: 2.6 g/dL (calc) (ref 1.9–3.7)
Glucose, Bld: 71 mg/dL (ref 65–99)
Potassium: 3.8 mmol/L (ref 3.5–5.3)
Sodium: 144 mmol/L (ref 135–146)
Total Bilirubin: 0.7 mg/dL (ref 0.2–1.2)
Total Protein: 6.5 g/dL (ref 6.1–8.1)
eGFR: 47 mL/min/{1.73_m2} — ABNORMAL LOW (ref >=60)

## 2023-06-12 LAB — HEMOGLOBIN A1C
Hgb A1c MFr Bld: 5.3 % of total Hgb (ref <5.7)
Mean Plasma Glucose: 105 mg/dL
eAG (mmol/L): 5.8 mmol/L

## 2023-06-12 LAB — TSH: TSH: 0.51 mIU/L (ref 0.40–4.50)

## 2023-06-22 ENCOUNTER — Other Ambulatory Visit: Payer: Self-pay

## 2023-06-22 ENCOUNTER — Other Ambulatory Visit: Payer: Self-pay | Admitting: Internal Medicine

## 2023-06-22 DIAGNOSIS — M1A9XX0 Chronic gout, unspecified, without tophus (tophi): Secondary | ICD-10-CM

## 2023-06-22 DIAGNOSIS — E1122 Type 2 diabetes mellitus with diabetic chronic kidney disease: Secondary | ICD-10-CM

## 2023-06-22 MED ORDER — COLCHICINE 0.6 MG PO TABS
ORAL_TABLET | ORAL | 3 refills | Status: DC
Start: 2023-06-22 — End: 2024-03-18

## 2023-06-27 ENCOUNTER — Other Ambulatory Visit: Payer: Self-pay | Admitting: Nurse Practitioner

## 2023-06-27 DIAGNOSIS — Z794 Long term (current) use of insulin: Secondary | ICD-10-CM

## 2023-06-29 ENCOUNTER — Encounter (INDEPENDENT_AMBULATORY_CARE_PROVIDER_SITE_OTHER): Payer: Medicare Other | Admitting: Ophthalmology

## 2023-06-29 ENCOUNTER — Other Ambulatory Visit: Payer: Self-pay | Admitting: Nurse Practitioner

## 2023-06-29 ENCOUNTER — Telehealth: Payer: Self-pay | Admitting: Nurse Practitioner

## 2023-06-29 DIAGNOSIS — H34812 Central retinal vein occlusion, left eye, with macular edema: Secondary | ICD-10-CM | POA: Diagnosis not present

## 2023-06-29 DIAGNOSIS — I10 Essential (primary) hypertension: Secondary | ICD-10-CM | POA: Diagnosis not present

## 2023-06-29 DIAGNOSIS — H43813 Vitreous degeneration, bilateral: Secondary | ICD-10-CM

## 2023-06-29 DIAGNOSIS — H35033 Hypertensive retinopathy, bilateral: Secondary | ICD-10-CM

## 2023-06-29 DIAGNOSIS — E1122 Type 2 diabetes mellitus with diabetic chronic kidney disease: Secondary | ICD-10-CM

## 2023-06-29 MED ORDER — TRULICITY 3 MG/0.5ML ~~LOC~~ SOAJ
SUBCUTANEOUS | 3 refills | Status: DC
Start: 2023-06-29 — End: 2024-03-18

## 2023-06-29 NOTE — Telephone Encounter (Signed)
Requesting refill on Trulicity. Pls send to [facility] on file.

## 2023-07-04 ENCOUNTER — Other Ambulatory Visit: Payer: Self-pay | Admitting: Nurse Practitioner

## 2023-07-04 DIAGNOSIS — I872 Venous insufficiency (chronic) (peripheral): Secondary | ICD-10-CM

## 2023-07-20 ENCOUNTER — Encounter (HOSPITAL_COMMUNITY): Payer: Self-pay | Admitting: Emergency Medicine

## 2023-07-20 ENCOUNTER — Ambulatory Visit (HOSPITAL_COMMUNITY)
Admission: EM | Admit: 2023-07-20 | Discharge: 2023-07-20 | Disposition: A | Discharge status: Home / Self Care | Payer: Medicare Other | Attending: Family Medicine | Admitting: Family Medicine

## 2023-07-20 ENCOUNTER — Other Ambulatory Visit: Payer: Self-pay

## 2023-07-20 DIAGNOSIS — Z20822 Contact with and (suspected) exposure to covid-19: Secondary | ICD-10-CM | POA: Diagnosis present

## 2023-07-20 NOTE — ED Triage Notes (Signed)
Patient has no symptoms.  Patients husband was diagnosed on Saturday with covid-diagnosed by ED 7/27

## 2023-07-20 NOTE — Discharge Instructions (Addendum)
You have been tested for COVID-19 today. °If your test returns positive, you will receive a phone call from [facility] regarding your results. °Negative test results are not called. °Both positive and negative results area always visible on MyChart. °If you do not have a MyChart account, sign up instructions are provided in your discharge papers. °Please do not hesitate to contact us should you have questions or concerns. ° °

## 2023-07-20 NOTE — ED Provider Notes (Signed)
[facility]   [id number] 07/20/23 Arrival Time: 1001  ASSESSMENT & PLAN:  1. Close exposure to COVID-19 virus     No symptoms. COVID test sent upon pt request. OTC symptom care as needed.   Follow-up Information     Lucky Cowboy, MD.   Specialty: Internal Medicine Why: As needed. Contact information: [address] [place] [address] [phone]                 Reviewed expectations re: course of current medical issues. Questions answered. Outlined signs and symptoms indicating need for more acute intervention. Understanding verbalized. After Visit Summary given.   SUBJECTIVE: History from: Patient. Jocelyn Camacho is a 77 y.o. female. Patient has no symptoms.  Patients husband was diagnosed on Saturday with covid-diagnosed by ED 7/27 Requesting COVID testing.  OBJECTIVE:  Vitals:   07/20/23 1010  BP: 136/79  Pulse: 86  Resp: (!) 22  Temp: 99.3 F (37.4 C)  TempSrc: Oral  SpO2: 94%    General appearance: alert; no distress Eyes: PERRLA; EOMI; conjunctiva normal HENT: Cloud; AT; without nasal congestion Neck: supple  Lungs: speaks full sentences without difficulty; unlabored Extremities: no edema Skin: warm and dry Psychological: alert and cooperative; normal mood and affect  Labs:  Labs Reviewed  SARS CORONAVIRUS 2 (TAT 6-24 HRS)     Allergies  Allergen Reactions   Penicillins Swelling   Sulfa Antibiotics Itching and Rash    Past Medical History:  Diagnosis Date   Allergic rhinitis    Allergy    Anxiety    with medical procedures   Hyperlipidemia    Hypertension    Obesity    OSA (obstructive sleep apnea)    wears CPAP   Type II or unspecified type diabetes mellitus with renal manifestations, not stated as uncontrolled(250.40)    Unspecified hypothyroidism    Vitamin D deficiency    Social History   Socioeconomic History   Marital status: Married    Spouse name: Charles   Number of  children: 1   Years of education: Not on file   Highest education level: Some college, no degree  Occupational History   Occupation: Retired  Tobacco Use   Smoking status: Never   Smokeless tobacco: Never  Vaping Use   Vaping status: Never Used  Substance and Sexual Activity   Alcohol use: Not Currently    Comment: very rarely   Drug use: No   Sexual activity: Not Currently    Partners: Male    Birth control/protection: Post-menopausal  Other Topics Concern   Not on file  Social History Narrative   Lives with husband    Right handed   Caffeine: 1 cup of coffee every 2 weeks   Social Determinants of Health   Financial Resource Strain: Not on file  Food Insecurity: Not on file  Transportation Needs: Not on file  Physical Activity: Inactive (06/14/2018)   Exercise Vital Sign    Days of Exercise per Week: 0 days    Minutes of Exercise per Session: 0 min  Stress: No Stress Concern Present (06/14/2018)   [employer] of Occupational Health - Occupational Stress Questionnaire    Feeling of Stress : Only a little  Social Connections: Not on file  Intimate Partner Violence: Not on file   Family History  Problem Relation Age of Onset   Breast cancer Mother    Renal Disease Mother    Hypertension Sister    Diabetes Sister  Diabetes Maternal Grandmother    Colon cancer Neg Hx    Esophageal cancer Neg Hx    Stomach cancer Neg Hx    Rectal cancer Neg Hx    Past Surgical History:  Procedure Laterality Date    eye procedure     have treatment where a needle is stuck into left eye- since June 2014; now every six weeks   BOWEL RESECTION N/A 07/24/2022   Procedure: SMALL BOWEL RESECTION;  Surgeon: Axel Filler, MD;  Location: [facility] OR;  Service: General;  Laterality: N/A;   DENTAL EXAMINATION UNDER ANESTHESIA     ENDOVENOUS ABLATION SAPHENOUS VEIN W/ LASER Left 12/31/2022   endovenous laser ablation left SSV by Cari Caraway MD   LAPAROSCOPY N/A 07/24/2022   Procedure:  LAPAROSCOPY DIAGNOSTIC;  Surgeon: Axel Filler, MD;  Location: [facility] OR;  Service: General;  Laterality: N/A;   LAPAROTOMY N/A 07/24/2022   Procedure: EXPLORATORY LAPAROTOMY;  Surgeon: Axel Filler, MD;  Location[facility];  Service: General;  Laterality: N/A;   TUBAL LIGATION     UMBILICAL HERNIA REPAIR N/A 07/24/2022   Procedure: PRIMARY UMBILICAL HERNIA REPAIR;  Surgeon: Axel Filler, MD;  Location: [facility] OR;  Service: General;  Laterality: Vertis Kelch, MD 07/20/23 1056

## 2023-08-03 ENCOUNTER — Encounter (INDEPENDENT_AMBULATORY_CARE_PROVIDER_SITE_OTHER): Payer: Medicare Other | Admitting: Ophthalmology

## 2023-08-03 DIAGNOSIS — I10 Essential (primary) hypertension: Secondary | ICD-10-CM

## 2023-08-03 DIAGNOSIS — H43813 Vitreous degeneration, bilateral: Secondary | ICD-10-CM | POA: Diagnosis not present

## 2023-08-03 DIAGNOSIS — H35033 Hypertensive retinopathy, bilateral: Secondary | ICD-10-CM

## 2023-08-03 DIAGNOSIS — H34812 Central retinal vein occlusion, left eye, with macular edema: Secondary | ICD-10-CM | POA: Diagnosis not present

## 2023-09-07 ENCOUNTER — Encounter (INDEPENDENT_AMBULATORY_CARE_PROVIDER_SITE_OTHER): Payer: Medicare Other | Admitting: Ophthalmology

## 2023-09-07 DIAGNOSIS — H43813 Vitreous degeneration, bilateral: Secondary | ICD-10-CM | POA: Diagnosis not present

## 2023-09-07 DIAGNOSIS — H34812 Central retinal vein occlusion, left eye, with macular edema: Secondary | ICD-10-CM | POA: Diagnosis not present

## 2023-09-07 DIAGNOSIS — I10 Essential (primary) hypertension: Secondary | ICD-10-CM

## 2023-09-07 DIAGNOSIS — H35033 Hypertensive retinopathy, bilateral: Secondary | ICD-10-CM | POA: Diagnosis not present

## 2023-09-20 ENCOUNTER — Other Ambulatory Visit: Payer: Self-pay | Admitting: Nurse Practitioner

## 2023-09-20 DIAGNOSIS — M1000 Idiopathic gout, unspecified site: Secondary | ICD-10-CM

## 2023-09-22 NOTE — Progress Notes (Unsigned)
CPE  Assessment and Plan:  Encounter for General Adult Medical Exam with Abnormal Findings 1 year Mammogram in December Declines flu shot  Essential hypertension Continue current medications:Continue Atenolol 100 mg every day and Lasix 80 mg every day  Monitor blood pressure at home; call if consistently over 130/80 Continue DASH diet.   Reminder to go to the ER if any CP, SOB, nausea, dizziness, severe HA, changes vision/speech, left arm numbness and tingling and jaw pain. - CMP/GFR -CBC  Type 2 Diabetes Mellitus with Stage 3 a CKD with Insulin use(HCC) Continue medications: Insulin NPH/Reg 70/30 25 units in am 8 in pm, Trulicity 3 mg sq qw, Kerendia 10 mg every day  Continue diet and exercise.  Perform daily foot/skin check, notify office of any concerning changes.  Check A1C   OSA (obstructive sleep apnea) Weight loss advised, needs to wear nightly, may benefit from new mask/titration   Hypothyroidism, unspecified hypothyroidism type Taking levothyroxine daily except Wednesday takes 1/2 tab Reminder to take on an empty stomach 30-72mins before first meal of the day. No antacid medications for 4 hours. 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 Continue to follow with nephrology     Hyperlipidemia associated with T2DM (HCC) Continue medications:Rosuvastatin 5 mg once a week Discussed dietary and exercise modifications Low fat diet - Lipid panel   Vitamin D deficiency Continue supplementation to maintain goal of 70-100 Taking Vitamin D 5,00IU, 2 tabs daily Defer vitamin D level  Type 2 diabetes with morbid obesity(HCC) Long discussion about weight loss, diet, and exercise Recommended diet heavy in fruits and veggies and low in animal meats, cheeses, and dairy products, appropriate calorie intake Patient will work on decreasing saturate fats and simple carbs, increase lean protein and activity as tolerated Follow up at next visit  Venous stasis Continue  Furosemide, compression socks, activity as tolerated  Medication management - Magnesium  CKD stage 3 due to type 2 diabetes mellitus Follows with nephrology, Increase fluids, avoid NSAIDS, monitor sugars, will monitor -  Microalbumin/creatinine urine ratio  - CMP/GFR  Anxiety Will take valium prior to procedures but would like to be able to take AS needed for sleep, long discussion about addictive nature and tolerance, will only take PRN.   Macular degeneration, unspecified laterality, unspecified type  continue follow up eye doctor- injections q 4-5 months with Dr Darrin Nipper  Chronic gout without tophus, unspecified cause, unspecified site  Diet discussed, continue medications.  Type 2 diabetes mellitus with hyperlipidemia (HCC) Continue 70/30, 20 untis in am and 8 unit PM. Rx Trulicity 3 mg SQ QW Rosuvastatin 5 mg every day  Discussed general issues about diabetes pathophysiology and management., Educational material distributed., Suggested low cholesterol diet., Encouraged aerobic exercise., Discussed foot care., Reminded to get yearly retinal exam. -     Hemoglobin A1c     Further disposition pending results if labs check today. Discussed med's effects and SE's.   Over 30 minutes of face to face interview, exam, counseling, chart review, and critical decision making was performed.    Discussed med's effects and SE's. Screening labs and tests as requested with regular follow-up as recommended. Future Appointments  Date Time Provider Department Center  10/12/2023  8:10 AM Sherrie George, MD [facility] None  11/11/2023 11:00 AM Shawnie Dapper, NP [facility] None  09/22/2024  9:00 AM Raynelle Dick, NP [facility] None     HPI  77 y.o. AA female  presents for CPE. has Essential hypertension; Type 2 diabetes mellitus with  stage 3 chronic kidney disease, with long-term current use of insulin (HCC); Vitamin D deficiency; OSA (obstructive sleep apnea); Hypothyroidism; Medication  management; Macular degeneration; Generalized anxiety disorder; Gout; Hyperlipidemia associated with type 2 diabetes mellitus (HCC); Venous stasis ulcer of left ankle limited to breakdown of skin without varicose veins (HCC); CKD stage 3 due to type 2 diabetes mellitus (HCC); Anemia of chronic disease; Diabetic retinopathy associated with type 2 diabetes mellitus (HCC); Class 3 severe obesity due to excess calories with serious comorbidity and body mass index (BMI) of 45.0 to 49.9 in adult [facility]); Hyperparathyroidism, secondary renal (HCC); Venous stasis of both lower extremities; Poor sleep hygiene; Loud snoring; Sleeps in sitting position due to orthopnea; Obesity, Class III, BMI 40-49.9 (morbid obesity) (HCC); Ankle edema, bilateral; Chronic intermittent hypoxia with obstructive sleep apnea; Strangulated umbilical hernia; AKI (acute kidney injury) (HCC); and Glaucoma on their problem list.   She has hx of venous stasis, recurrent LE ulcers, She has been followed by vascular surgery.  Had ablation and doing well at this time  She has been noting swelling in abdomen x 2 weeks/  Denies nausea, constipation, diarrhea, pain and gas. She does report more spitting - mostly saliva, occasional clear mucus.  Denies cough.   She is getting shots for Mac Degen with Dr. Ashley Royalty q 4-5w, she is prescribed valium to take prior to eye injections.   She does have vitiligo of hands knees and face.   She has OSA and had sleep study a few months ago and has been sleeping with CPAP every night   She is following with ortho Dr. Althea Charon at [facility] for bilateral knee pain and has been advised may need TKA at some point. She had injections 3 months ago and no longer having any pain relief.  She will take tylenol only if needed, uses OTC topicals and takes tumeric/bioprene supplement daily.   She has not noticed any swelling but reports she can feel some tightness in the right knee.  She does use a cane for ambulation  but left it in the car today.  BMI is Body mass index is 42.29 kg/m., she has been working on diet, reducing white items/starches, and sweets. Knees prohibit much exercise. Wt Readings from Last 3 Encounters:  09/23/23 231 lb 3.2 oz (104.9 kg)  06/11/23 228 lb 12.8 oz (103.8 kg)  03/25/23 224 lb (101.6 kg)   Her blood pressure has been controlled on Atenolol 100 mg every day ,lasix 80 mg every day,  today their BP is BP: 112/70  BP Readings from Last 3 Encounters:  09/23/23 112/70  07/20/23 136/79  06/11/23 130/74  She does not workout due to knee pain.  She denies chest pain, shortness of breath, dizziness.   She is on cholesterol medication, Rosuvastatin 5 mg once a week and denies myalgias. Her cholesterol is at goal. The cholesterol last visit was:   Lab Results  Component Value Date   CHOL 113 06/11/2023   HDL 36 (L) 06/11/2023   LDLCALC 54 06/11/2023   TRIG 148 06/11/2023   CHOLHDL 3.1 06/11/2023   She has had diabetes for 3 years. She has been working on diet and exercise for Diabetes with CKD stage III, she is on ACE, she is on bASA, She is off metformin due to kidneys, she is on 70/30 insulin 25 unit AM  and 8 units PM and Trulicity 3 mg QW denies paresthesia of the feet, polydipsia and polyuria. She is checking her sugars, fasting  runs in 90-100, evening is 90- 140,  Checks sugars twice a day with one touch ultra. Denies any hypoglycemia. Last A1C in the office was:  Lab Results  Component Value Date   HGBA1C 5.3 06/11/2023   Drinking lots of water,.  Follows with nephrology and next appt in 1 month. Taking Karendia 10 mg every day. She has mild edema since reducing dose, typically well managed by compression.  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   Lab  Results  Component Value Date   VD25OH 102 07/23/2022     She is on thyroid medication. Her medication was not changed last visit. She is on 125 mcg daily, except Wednesday takes 1/2 tab. She takes it 30 mins before food with just water.  Lab Results  Component Value Date   TSH 0.51 06/11/2023   Patient is on allopurinol for gout and does not report a recent flare.  Lab Results  Component Value Date   LABURIC 5.9 11/11/2021     Current Medications:  Current Outpatient Medications on File Prior to Visit  Medication Sig Dispense Refill   acetaminophen (TYLENOL) 650 MG CR tablet Take 1,300 mg by mouth every 8 (eight) hours as needed for pain.     allopurinol (ZYLOPRIM) 300 MG tablet TAKE 1/2 TABLET(150 MG) BY MOUTH DAILY FOR GOUT PREVENTION 45 tablet 2   Ascorbic Acid (VITAMIN C) 1000 MG tablet Take 1,000 mg by mouth daily.     aspirin EC 81 MG tablet Take 81 mg by mouth daily.     atenolol (TENORMIN) 100 MG tablet Take 1 tablet by mouth once daily for blood pressure 90 tablet 0   Cholecalciferol (VITAMIN D3) 5000 units CAPS Take 5,000 Units by mouth daily.     ciprofloxacin (CILOXAN) 0.3 % ophthalmic solution      colchicine 0.6 MG tablet Take 1 tablet daily for prevention of Gout 90 tablet 3   Cyanocobalamin (B-12) 1000 MCG SUBL Place 1,000 mcg under the tongue 2 (two) times a week. Saturday and Sunday     dorzolamide-timolol (COSOPT) 22.3-6.8 MG/ML ophthalmic solution Place 1 drop into the left eye 2 (two) times daily.     Dulaglutide (TRULICITY) 3 MG/0.5ML SOPN INJECT 3 MG  SUBCUTANEOUSLY ONCE A WEEK FOR DIABETES 12 mL 3   Finerenone (KERENDIA) 10 MG TABS Take 10 mg by mouth daily. Take  1 tablet  Daily  for Diabetic Kidneys (Patient taking differently: Take 10 mg by mouth daily. Diabetic Kidneys) 90 tablet 0   furosemide (LASIX) 80 MG tablet TAKE 1 TABLET BY MOUTH 1 TO 2 TIMES PER DAY AS NEEDED FOR FLUID RETENTION OR ANKLE SWELLING 180 tablet 3   hyoscyamine (LEVSIN, ANASPAZ) 0.125  MG tablet Take 1 tablet (0.125 mg total) by mouth every 6 (six) hours as needed. (Patient taking differently: Take 0.125 mg by mouth every 6 (six) hours as needed for bladder spasms.) 60 tablet 1   insulin NPH-regular Human (70-30) 100 UNIT/ML injection Patient injects 25 units in the AM and 10 units in the PM. Reduce each insulin dose by 2 units if glucose persistently <100 (Patient taking differently: Inject 5-20 Units into the skin See admin instructions. Patient injects 20 units in the AM and  5 units in the PM. Reduce each insulin dose by 2 units if glucose persistently <100) 10 mL    Lancets (ONETOUCH ULTRASOFT) lancets Check blood sugar 3 times daily-[id number] 300 each 1   levothyroxine (SYNTHROID) 125 MCG tablet TAKE 1 TABLET BY MOUTH DAILY ON AN EMPTY STOMACH WITH ONLY WATER FOR 30 MINUTES& NO ANTACID MEDS, CALCIUM OR MAGNESIUM FOR 4 HOURS& AVIOD BIOTIN 90 tablet 3   nystatin cream (MYCOSTATIN) Apply 1 Application topically 2 (two) times daily. 30 g 2   ONETOUCH ULTRA test strip CHECK BLOOD SUGAR 3 TIMES DAILY 300 strip 3   polymyxin B 500,000 Units in sodium chloride irrigation 0.9 % 500 mL Irrigate with 1 Application as directed every 30 (thirty) days. Eye Injection     rosuvastatin (CRESTOR) 5 MG tablet Take 1 tablet once weekly as directed for Cholesterol LDL goal less than 70. 13 tablet 3   traMADol (ULTRAM) 50 MG tablet Take 1 tablet (50 mg total) by mouth every 6 (six) hours as needed for moderate pain. 15 tablet 0   zinc gluconate 50 MG tablet Take 50 mg by mouth daily.     No current facility-administered medications on file prior to visit.   Health Maintenance:   Immunization History  Administered Date(s) Administered   DT (Pediatric) 06/19/2015   DTaP 12/09/2004   PFIZER Comirnaty(Gray Top)Covid-19 Tri-Sucrose Vaccine 04/18/2021   PFIZER(Purple Top)SARS-COV-2 Vaccination 02/03/2020, 02/28/2020, 10/09/2020   Pfizer Covid-19 Vaccine Bivalent Booster 26yrs & up 02/17/2022    Pneumococcal Conjugate-13 12/19/2014   Pneumococcal Polysaccharide-23 03/02/2009, 01/17/2019   Health Maintenance  Topic Date Due   Zoster Vaccines- Shingrix (1 of 2) Never done   INFLUENZA VACCINE  07/23/2023   COVID-19 Vaccine (6 - 2023-24 season) 08/23/2023   HEMOGLOBIN A1C  12/11/2023   OPHTHALMOLOGY EXAM  03/01/2024   Diabetic kidney evaluation - Urine ACR  03/04/2024   FOOT EXAM  03/04/2024   Medicare Annual Wellness (AWV)  03/04/2024   Diabetic kidney evaluation - eGFR measurement  06/10/2024   DTaP/Tdap/Td (3 - Tdap) 06/18/2025   Pneumonia Vaccine 67+ Years old  Completed   DEXA SCAN  Completed   Hepatitis C Screening  Completed   HPV VACCINES  Aged Out   Colonoscopy  Discontinued     Eye exam Dr. Ashley Royalty sees q 4-5 week for injections, had diabetes eye report Dr. Dione Booze  Dental: Dr. Jeanell Sparrow, last exam a few years ago, 02/2023      Patient Care Team: Lucky Cowboy, MD as PCP - General (Internal Medicine) Sallye Lat, MD as Consulting Physician (Optometry) Sherrie George, MD as Consulting Physician (Ophthalmology) Louis Meckel, MD (Inactive) as Consulting Physician (Gastroenterology) Cherlyn Roberts, MD as Consulting Physician (Dermatology) Nita Sells, MD (Dermatology)   Medical History:  Past Medical History:  Diagnosis Date   Allergic rhinitis    Allergy    Anxiety    with medical procedures   Hyperlipidemia    Hypertension    Obesity    OSA (obstructive sleep apnea)    wears CPAP   Type II or unspecified type diabetes mellitus with renal manifestations, not stated as uncontrolled(250.40)    Unspecified hypothyroidism    Vitamin D deficiency    Allergies Allergies  Allergen Reactions   Penicillins Swelling   Sulfa Antibiotics Itching and Rash    SURGICAL HISTORY She  has a past surgical history that includes  eye procedure; Tubal ligation; Dental examination under anesthesia; laparoscopy (N/A, 07/24/2022); laparotomy (N/A,  07/24/2022); Bowel resection (  N/A, 07/24/2022); Umbilical hernia repair (N/A, 07/24/2022); and Endovenous ablation saphenous vein w/ laser (Left, 12/31/2022). FAMILY HISTORY Her family history includes Breast cancer in her mother; Diabetes in her maternal grandmother and sister; Hypertension in her sister; Renal Disease in her mother. SOCIAL HISTORY She  reports that she has never smoked. She has never used smokeless tobacco. She reports that she does not currently use alcohol. She reports that she does not use drugs.    Review of Systems  Constitutional: Negative.  Negative for malaise/fatigue and weight loss.  HENT: Negative.  Negative for hearing loss and tinnitus.   Eyes: Negative.  Negative for blurred vision and double vision.  Respiratory:  Negative for cough, shortness of breath and wheezing.   Cardiovascular: Negative.  Negative for chest pain, palpitations, orthopnea, claudication and leg swelling.  Gastrointestinal: Negative.  Negative for abdominal pain, blood in stool, constipation, diarrhea, heartburn, melena, nausea and vomiting.  Genitourinary: Negative.  Negative for dysuria, flank pain, frequency, hematuria and urgency.  Musculoskeletal:  Positive for joint pain (bilateral knee pain). Negative for myalgias.  Skin: Negative.  Negative for rash.  Neurological: Negative.  Negative for dizziness, tingling, sensory change, weakness and headaches.  Endo/Heme/Allergies: Negative.  Negative for polydipsia.  Psychiatric/Behavioral: Negative.  Negative for depression, hallucinations, memory loss, substance abuse and suicidal ideas. The patient is not nervous/anxious and does not have insomnia.   All other systems reviewed and are negative.   Physical Exam: Estimated body mass index is 42.29 kg/m as calculated from the following:   Height as of this encounter: 5\' 2"  (1.575 m).   Weight as of this encounter: 231 lb 3.2 oz (104.9 kg). BP 112/70   Pulse 84   Temp 97.7 F (36.5 C)    Ht 5\' 2"  (1.575 m)   Wt 231 lb 3.2 oz (104.9 kg)   SpO2 97%   BMI 42.29 kg/m  General Appearance:  Obese female in no apparent distress.  Eyes: PERRLA, EOMs, conjunctiva no swelling or erythema Sinuses: No Frontal/maxillary tenderness  ENT/Mouth: Ext aud canals clear, normal light reflex with TMs without erythema, bulging. Good dentition. No erythema, swelling, or exudate on post pharynx.  Hearing normal.  Neck: Supple, thyroid normal. No bruits  Respiratory: Respiratory effort normal, BS equal bilaterally without rales, rhonchi, wheezing or stridor.  Cardio: RRR with normal S1/S2, without rubs or gallops. Brisk peripheral pulses with mild edema.  Chest: symmetric, with normal excursions and percussion.  Abdomen: Soft, nontender, no guarding, rebound, hernias, masses, or organomegaly.  Bowel Sounds + all 4 quadrants.  Lymphatics: Non tender without lymphadenopathy.  Musculoskeletal: Full ROM all peripheral extremities, 5/5 strength, and slow, antalgic gait with cane.  Skin: Vitiligo bilateral hands and feet. Warm, dry . Left lower leg has purple discolored patch but no open sores Neuro: Cranial nerves intact, reflexes equal bilaterally. Normal muscle tone, no cerebellar symptoms. Sensation intact with monofilament Psych: Awake and oriented X 3, flat affect, Insight and Judgment appropriate.  EKG: NSR, no ST changes  [facility]  09/23/2023

## 2023-09-23 ENCOUNTER — Ambulatory Visit (INDEPENDENT_AMBULATORY_CARE_PROVIDER_SITE_OTHER): Payer: Medicare Other | Admitting: Nurse Practitioner

## 2023-09-23 ENCOUNTER — Encounter: Payer: Self-pay | Admitting: Nurse Practitioner

## 2023-09-23 VITALS — BP 112/70 | HR 84 | Temp 97.7°F | Ht 62.0 in | Wt 231.2 lb

## 2023-09-23 DIAGNOSIS — E1122 Type 2 diabetes mellitus with diabetic chronic kidney disease: Secondary | ICD-10-CM

## 2023-09-23 DIAGNOSIS — I10 Essential (primary) hypertension: Secondary | ICD-10-CM

## 2023-09-23 DIAGNOSIS — E11319 Type 2 diabetes mellitus with unspecified diabetic retinopathy without macular edema: Secondary | ICD-10-CM

## 2023-09-23 DIAGNOSIS — E039 Hypothyroidism, unspecified: Secondary | ICD-10-CM

## 2023-09-23 DIAGNOSIS — Z794 Long term (current) use of insulin: Secondary | ICD-10-CM

## 2023-09-23 DIAGNOSIS — E1169 Type 2 diabetes mellitus with other specified complication: Secondary | ICD-10-CM

## 2023-09-23 DIAGNOSIS — Z0000 Encounter for general adult medical examination without abnormal findings: Secondary | ICD-10-CM | POA: Diagnosis not present

## 2023-09-23 DIAGNOSIS — Z136 Encounter for screening for cardiovascular disorders: Secondary | ICD-10-CM | POA: Diagnosis not present

## 2023-09-23 DIAGNOSIS — Z79899 Other long term (current) drug therapy: Secondary | ICD-10-CM

## 2023-09-23 DIAGNOSIS — G4733 Obstructive sleep apnea (adult) (pediatric): Secondary | ICD-10-CM

## 2023-09-23 DIAGNOSIS — E559 Vitamin D deficiency, unspecified: Secondary | ICD-10-CM

## 2023-09-23 DIAGNOSIS — H3530 Unspecified macular degeneration: Secondary | ICD-10-CM

## 2023-09-23 DIAGNOSIS — I878 Other specified disorders of veins: Secondary | ICD-10-CM

## 2023-09-23 DIAGNOSIS — M1000 Idiopathic gout, unspecified site: Secondary | ICD-10-CM

## 2023-09-23 DIAGNOSIS — N2581 Secondary hyperparathyroidism of renal origin: Secondary | ICD-10-CM

## 2023-09-23 NOTE — Patient Instructions (Signed)

## 2023-09-24 LAB — CBC WITH DIFFERENTIAL/PLATELET
Absolute Monocytes: 535 {cells}/uL (ref 200–950)
Basophils Absolute: 89 {cells}/uL (ref 0–200)
Basophils Relative: 1.1 %
Eosinophils Absolute: 203 {cells}/uL (ref 15–500)
Eosinophils Relative: 2.5 %
HCT: 36.8 % (ref 35.0–45.0)
Hemoglobin: 11.4 g/dL — ABNORMAL LOW (ref 11.7–15.5)
Lymphs Abs: 2770 {cells}/uL (ref 850–3900)
MCH: 28.1 pg (ref 27.0–33.0)
MCHC: 31 g/dL — ABNORMAL LOW (ref 32.0–36.0)
MCV: 90.9 fL (ref 80.0–100.0)
MPV: 12.1 fL (ref 7.5–12.5)
Monocytes Relative: 6.6 %
Neutro Abs: 4504 {cells}/uL (ref 1500–7800)
Neutrophils Relative %: 55.6 %
Platelets: 360 10*3/uL (ref 140–400)
RBC: 4.05 10*6/uL (ref 3.80–5.10)
RDW: 14.6 % (ref 11.0–15.0)
Total Lymphocyte: 34.2 %
WBC: 8.1 10*3/uL (ref 3.8–10.8)

## 2023-09-24 LAB — LIPID PANEL
Cholesterol: 117 mg/dL (ref <200)
HDL: 36 mg/dL — ABNORMAL LOW (ref >=50)
LDL Cholesterol (Calc): 59 mg/dL
Non-HDL Cholesterol (Calc): 81 mg/dL (ref <130)
Total CHOL/HDL Ratio: 3.3 (calc) (ref <5.0)
Triglycerides: 134 mg/dL (ref <150)

## 2023-09-24 LAB — TSH: TSH: 0.42 m[IU]/L (ref 0.40–4.50)

## 2023-09-24 LAB — COMPLETE METABOLIC PANEL WITH GFR
AG Ratio: 1.3 (calc) (ref 1.0–2.5)
ALT: 13 U/L (ref 6–29)
AST: 17 U/L (ref 10–35)
Albumin: 4 g/dL (ref 3.6–5.1)
Alkaline phosphatase (APISO): 112 U/L (ref 37–153)
BUN/Creatinine Ratio: 20 (calc) (ref 6–22)
BUN: 27 mg/dL — ABNORMAL HIGH (ref 7–25)
CO2: 28 mmol/L (ref 20–32)
Calcium: 9.1 mg/dL (ref 8.6–10.4)
Chloride: 102 mmol/L (ref 98–110)
Creat: 1.36 mg/dL — ABNORMAL HIGH (ref 0.60–1.00)
Globulin: 3 g/dL (ref 1.9–3.7)
Glucose, Bld: 108 mg/dL — ABNORMAL HIGH (ref 65–99)
Potassium: 4 mmol/L (ref 3.5–5.3)
Sodium: 141 mmol/L (ref 135–146)
Total Bilirubin: 0.7 mg/dL (ref 0.2–1.2)
Total Protein: 7 g/dL (ref 6.1–8.1)
eGFR: 40 mL/min/{1.73_m2} — ABNORMAL LOW (ref >=60)

## 2023-09-24 LAB — URINALYSIS, ROUTINE W REFLEX MICROSCOPIC
Bilirubin Urine: NEGATIVE
Glucose, UA: NEGATIVE
Hgb urine dipstick: NEGATIVE
Ketones, ur: NEGATIVE
Nitrite: NEGATIVE
Protein, ur: NEGATIVE
RBC / HPF: NONE SEEN /[HPF] (ref 0–2)
Specific Gravity, Urine: 1.016 (ref 1.001–1.035)
pH: 6 (ref 5.0–8.0)

## 2023-09-24 LAB — MAGNESIUM: Magnesium: 2.3 mg/dL (ref 1.5–2.5)

## 2023-09-24 LAB — HEMOGLOBIN A1C W/OUT EAG: Hgb A1c MFr Bld: 5.4 %{Hb} (ref <5.7)

## 2023-09-24 LAB — MICROSCOPIC MESSAGE

## 2023-09-24 LAB — MICROALBUMIN / CREATININE URINE RATIO
Creatinine, Urine: 116 mg/dL (ref 20–275)
Microalb Creat Ratio: 12 mg/g{creat} (ref <30)
Microalb, Ur: 1.4 mg/dL

## 2023-09-24 LAB — VITAMIN D 25 HYDROXY (VIT D DEFICIENCY, FRACTURES): Vit D, 25-Hydroxy: 71 ng/mL (ref 30–100)

## 2023-10-12 ENCOUNTER — Encounter (INDEPENDENT_AMBULATORY_CARE_PROVIDER_SITE_OTHER): Payer: Medicare Other | Admitting: Ophthalmology

## 2023-10-12 DIAGNOSIS — H35033 Hypertensive retinopathy, bilateral: Secondary | ICD-10-CM

## 2023-10-12 DIAGNOSIS — H34812 Central retinal vein occlusion, left eye, with macular edema: Secondary | ICD-10-CM

## 2023-10-12 DIAGNOSIS — I10 Essential (primary) hypertension: Secondary | ICD-10-CM | POA: Diagnosis not present

## 2023-10-12 DIAGNOSIS — H43813 Vitreous degeneration, bilateral: Secondary | ICD-10-CM

## 2023-11-01 ENCOUNTER — Other Ambulatory Visit: Payer: Self-pay | Admitting: Nurse Practitioner

## 2023-11-09 ENCOUNTER — Ambulatory Visit: Payer: Medicare Other | Admitting: Family Medicine

## 2023-11-09 NOTE — Patient Instructions (Signed)

## 2023-11-09 NOTE — Progress Notes (Unsigned)
PATIENT: Jocelyn Camacho DOB: 04/29/1946  REASON FOR VISIT: follow up HISTORY FROM: patient  No chief complaint on file.    HISTORY OF PRESENT ILLNESS:  11/09/23 ALL:  [place] returns for follow up for OSA on CPAP. She continues to do well on therapy. She is using therapy nightly for about     11/18/2022 ALL:  Jocelyn Camacho returns for follow up for OSA on CPAP. She is doing well. She is using her machine for about 4-5 hours. She is concerned that the machine may not be recording accurately. She has set alarms for 4 hours and the machine has recorded less time. She usually sleeps for about 5 hours. She is tolerating CPAP well. She is not certain she sleeps any better.     04/23/2022 ALL: Jocelyn Camacho is a 77 y.o. female here today for follow up for OSA on CPAP.  She was seen in consult with Dr Vickey Huger 10/24/2021 for sleep study to resume CPAP. She had previously used CPAP but been off therapy for about a year due to needing a new machine. PSG 12/03/2021 showed moderate/severe OSA with total AHI 27.4/hr with severe hypoxia with O2 nadir 83% , O2 below 89 for 64 minutes. CPAP titration study 01/15/2022 showed OSA treated well at 8cmH20 and hypoxia resolved. AutoPAP ordered. Since, she reports using CPAP most nights. She reports having long standing issue with insomnia. She is taking melatonin 10mg  daily. She hasn't been interested in adding more medications. She has not noticed much benefit of using CPAP. She is followed closely by PCP.     HISTORY: (copied from Dohmeier's previous note)  Jocelyn Camacho is a 77 y.o. year old Black or [place] American female patient seen here as a referral on 10/24/2021 from Dr. Oneta Rack for a new evaluation of OSA. Marland Kitchen  Chief concern according to patient :  I had a sleep study on [address], @[facility], many years ago- and need a new CPAP. I have not been using my machine for a year now , it wasn't working as well as I thought it should" I was last tested and  treated over 10 years ago. ".    I have the pleasure of seeing Jocelyn Camacho today, a right-handed [place] or [place] American female with a history of OSA sleep disorder.  She has a past medical history of Allergic rhinitis, Allergy, Anxiety, Hyperlipidemia, Hypertension, Obesity, OSA (obstructive sleep apnea), Type II or unspecified type diabetes mellitus with renal manifestations, not stated as uncontrolled(250.40), Unspecified hypothyroidism, and Vitamin D deficiency..     Sleep relevant medical history: now not on CPAP : Nocturia 2 times , no Tonsillectomy, no cervical spine surgery, no deviated septum . Knee arthritis. DM on insulin.     Family medical /sleep history: no  other family member on CPAP with OSA,.    Social history:  Patient is retired from [employer] and lives in a household with spouse,  adult daughter lives nearby. No pets.Tobacco use/ .  ETOH use /,  Caffeine intake in form of Coffee( 1-2 a week) Soda( /) Tea ( /) or energy drinks. Regular exercise in form of -nothing, not a swimmer.    Sleep habits are as follows: The patient's dinner time is between 6 PM. The patient goes to sleep on a love seat in her den- not reclined- seated. Tv is on , lights on all night-  PM and continues to sleep for intervals of 2 -3 hours,  wakes  for 2 bathroom breaks, the first time at 3 AM.   The preferred sleep position is seated, with the support of one pillow.  Dreams are reportedly rare.  7  AM is the usual rise time. The patient wakes up spontaneously.  She reports not feeling unrefreshed or unrestored in AM, with symptoms such as dry mouth, morning headaches, and residual fatigue.  Naps are taken infrequently, lasting from 15 to 20 minutes and are refreshing .   REVIEW OF SYSTEMS: Out of a complete 14 system review of symptoms, the patient complains only of the following symptoms, insomnia, fatigue, and all other reviewed systems are negative.  ESS: 3/24   ALLERGIES: Allergies  Allergen  Reactions   Penicillins Swelling   Sulfa Antibiotics Itching and Rash    HOME MEDICATIONS: Outpatient Medications Prior to Visit  Medication Sig Dispense Refill   acetaminophen (TYLENOL) 650 MG CR tablet Take 1,300 mg by mouth every 8 (eight) hours as needed for pain.     allopurinol (ZYLOPRIM) 300 MG tablet TAKE 1/2 TABLET(150 MG) BY MOUTH DAILY FOR GOUT PREVENTION 45 tablet 2   Ascorbic Acid (VITAMIN C) 1000 MG tablet Take 1,000 mg by mouth daily.     aspirin EC 81 MG tablet Take 81 mg by mouth daily.     atenolol (TENORMIN) 100 MG tablet Take 1 tablet by mouth once daily for blood pressure 90 tablet 2   Cholecalciferol (VITAMIN D3) 5000 units CAPS Take 5,000 Units by mouth daily.     ciprofloxacin (CILOXAN) 0.3 % ophthalmic solution      colchicine 0.6 MG tablet Take 1 tablet daily for prevention of Gout 90 tablet 3   Cyanocobalamin (B-12) 1000 MCG SUBL Place 1,000 mcg under the tongue 2 (two) times a week. Saturday and Sunday     dorzolamide-timolol (COSOPT) 22.3-6.8 MG/ML ophthalmic solution Place 1 drop into the left eye 2 (two) times daily.     Dulaglutide (TRULICITY) 3 MG/0.5ML SOPN INJECT 3 MG  SUBCUTANEOUSLY ONCE A WEEK FOR DIABETES 12 mL 3   Finerenone (KERENDIA) 10 MG TABS Take 10 mg by mouth daily. Take  1 tablet  Daily  for Diabetic Kidneys (Patient taking differently: Take 10 mg by mouth daily. Diabetic Kidneys) 90 tablet 0   furosemide (LASIX) 80 MG tablet TAKE 1 TABLET BY MOUTH 1 TO 2 TIMES PER DAY AS NEEDED FOR FLUID RETENTION OR ANKLE SWELLING 180 tablet 3   hyoscyamine (LEVSIN, ANASPAZ) 0.125 MG tablet Take 1 tablet (0.125 mg total) by mouth every 6 (six) hours as needed. (Patient taking differently: Take 0.125 mg by mouth every 6 (six) hours as needed for bladder spasms.) 60 tablet 1   insulin NPH-regular Human (70-30) 100 UNIT/ML injection Patient injects 25 units in the AM and 10 units in the PM. Reduce each insulin dose by 2 units if glucose persistently <100 (Patient  taking differently: Inject 5-20 Units into the skin See admin instructions. Patient injects 20 units in the AM and 5 units in the PM. Reduce each insulin dose by 2 units if glucose persistently <100) 10 mL    Lancets (ONETOUCH ULTRASOFT) lancets Check blood sugar 3 times daily-[id number] 300 each 1   levothyroxine (SYNTHROID) 125 MCG tablet TAKE 1 TABLET BY MOUTH DAILY ON AN EMPTY STOMACH WITH ONLY WATER FOR 30 MINUTES& NO ANTACID MEDS, CALCIUM OR MAGNESIUM FOR 4 HOURS& AVIOD BIOTIN 90 tablet 3   nystatin cream (MYCOSTATIN) Apply 1 Application topically 2 (two) times daily. 30 g 2  ONETOUCH ULTRA test strip CHECK BLOOD SUGAR 3 TIMES DAILY 300 strip 3   polymyxin B 500,000 Units in sodium chloride irrigation 0.9 % 500 mL Irrigate with 1 Application as directed every 30 (thirty) days. Eye Injection     rosuvastatin (CRESTOR) 5 MG tablet Take 1 tablet once weekly as directed for Cholesterol LDL goal less than 70. 13 tablet 3   traMADol (ULTRAM) 50 MG tablet Take 1 tablet (50 mg total) by mouth every 6 (six) hours as needed for moderate pain. 15 tablet 0   zinc gluconate 50 MG tablet Take 50 mg by mouth daily.     No facility-administered medications prior to visit.    PAST MEDICAL HISTORY: Past Medical History:  Diagnosis Date   Allergic rhinitis    Allergy    Anxiety    with medical procedures   Hyperlipidemia    Hypertension    Obesity    OSA (obstructive sleep apnea)    wears CPAP   Type II or unspecified type diabetes mellitus with renal manifestations, not stated as uncontrolled(250.40)    Unspecified hypothyroidism    Vitamin D deficiency     PAST SURGICAL HISTORY: Past Surgical History:  Procedure Laterality Date    eye procedure     have treatment where a needle is stuck into left eye- since June 2014; now every six weeks   BOWEL RESECTION N/A 07/24/2022   Procedure: SMALL BOWEL RESECTION;  Surgeon: Axel Filler, MD;  Location: [facility] OR;  Service: General;  Laterality: N/A;    DENTAL EXAMINATION UNDER ANESTHESIA     ENDOVENOUS ABLATION SAPHENOUS VEIN W/ LASER Left 12/31/2022   endovenous laser ablation left SSV by Cari Caraway MD   LAPAROSCOPY N/A 07/24/2022   Procedure: LAPAROSCOPY DIAGNOSTIC;  Surgeon: Axel Filler, MD;  Location: [facility] OR;  Service: General;  Laterality: N/A;   LAPAROTOMY N/A 07/24/2022   Procedure: EXPLORATORY LAPAROTOMY;  Surgeon: Axel Filler, MD;  Location: [facility] OR;  Service: General;  Laterality: N/A;   TUBAL LIGATION     UMBILICAL HERNIA REPAIR N/A 07/24/2022   Procedure: PRIMARY UMBILICAL HERNIA REPAIR;  Surgeon: Axel Filler, MD;  Location: [facility] OR;  Service: General;  Laterality: N/A;    FAMILY HISTORY: Family History  Problem Relation Age of Onset   Breast cancer Mother    Renal Disease Mother    Hypertension Sister    Diabetes Sister    Diabetes Maternal Grandmother    Colon cancer Neg Hx    Esophageal cancer Neg Hx    Stomach cancer Neg Hx    Rectal cancer Neg Hx     SOCIAL HISTORY: Social History   Socioeconomic History   Marital status: Married    Spouse name: Charles   Number of children: 1   Years of education: Not on file   Highest education level: Some college, no degree  Occupational History   Occupation: Retired  Tobacco Use   Smoking status: Never   Smokeless tobacco: Never  Vaping Use   Vaping status: Never Used  Substance and Sexual Activity   Alcohol use: Not Currently    Comment: very rarely   Drug use: No   Sexual activity: Not Currently    Partners: Male    Birth control/protection: Post-menopausal  Other Topics Concern   Not on file  Social History Narrative   Lives with husband    Right handed   Caffeine: 1 cup of coffee every 2 weeks   Social Determinants of Health  Financial Resource Strain: Not on file  Food Insecurity: Not on file  Transportation Needs: Not on file  Physical Activity: Inactive (06/14/2018)   Exercise Vital Sign    Days of Exercise per Week: 0 days     Minutes of Exercise per Session: 0 min  Stress: No Stress Concern Present (06/14/2018)   [employer] of Occupational Health - Occupational Stress Questionnaire    Feeling of Stress : Only a little  Social Connections: Not on file  Intimate Partner Violence: Not on file     PHYSICAL EXAM  There were no vitals filed for this visit.   There is no height or weight on file to calculate BMI.  Generalized: Well developed, in no acute distress  Cardiology: normal rate and rhythm, no murmur noted Respiratory: clear to auscultation bilaterally  Neurological examination  Mentation: Alert oriented to time, place, history taking. Follows all commands speech and language fluent Cranial nerve II-XII: Pupils were equal round reactive to light. Extraocular movements were full, visual field were full  Motor: The motor testing reveals 5 over 5 strength of all 4 extremities. Good symmetric motor tone is noted throughout.  Gait and station: Gait is wide, stable with cane.     DIAGNOSTIC DATA (LABS, IMAGING, TESTING) - I reviewed patient records, labs, notes, testing and imaging myself where available.      No data to display           Lab Results  Component Value Date   WBC 8.1 09/23/2023   HGB 11.4 (L) 09/23/2023   HCT 36.8 09/23/2023   MCV 90.9 09/23/2023   PLT 360 09/23/2023      Component Value Date/Time   NA 141 09/23/2023 0946   K 4.0 09/23/2023 0946   CL 102 09/23/2023 0946   CO2 28 09/23/2023 0946   GLUCOSE 108 (H) 09/23/2023 0946   BUN 27 (H) 09/23/2023 0946   CREATININE 1.36 (H) 09/23/2023 0946   CALCIUM 9.1 09/23/2023 0946   PROT 7.0 09/23/2023 0946   ALBUMIN 3.5 09/09/2022 1216   AST 17 09/23/2023 0946   ALT 13 09/23/2023 0946   ALKPHOS 62 09/09/2022 1216   BILITOT 0.7 09/23/2023 0946   GFRNONAA 34 (L) 09/09/2022 1216   GFRNONAA 38 (L) 03/04/2021 1110   GFRAA 44 (L) 03/04/2021 1110   Lab Results  Component Value Date   CHOL 117 09/23/2023   HDL 36  (L) 09/23/2023   LDLCALC 59 09/23/2023   TRIG 134 09/23/2023   CHOLHDL 3.3 09/23/2023   Lab Results  Component Value Date   HGBA1C 5.4 09/23/2023   Lab Results  Component Value Date   VITAMINB12 1,548 (H) 08/08/2021   Lab Results  Component Value Date   TSH 0.42 09/23/2023     ASSESSMENT AND PLAN 77 y.o. year old female  has a past medical history of Allergic rhinitis, Allergy, Anxiety, Hyperlipidemia, Hypertension, Obesity, OSA (obstructive sleep apnea), Type II or unspecified type diabetes mellitus with renal manifestations, not stated as uncontrolled(250.40), Unspecified hypothyroidism, and Vitamin D deficiency. here with   No diagnosis found.    Jocelyn Camacho is doing well on CPAP therapy. Compliance report reveals optimal compliance. She was encouraged to continue using CPAP nightly and for greater than 4 hours each night. Leak has improved. Risks of untreated sleep apnea review and education materials provided. Healthy lifestyle habits encouraged. She will follow up in 1 year, sooner if needed. She verbalizes understanding and agreement with this plan.  No orders of the defined types were placed in this encounter.    No orders of the defined types were placed in this encounter.     Shawnie Dapper, FNP-C 11/09/2023, 4:39 PM [facility] [address], [address] [place], [place] [address] [phone]

## 2023-11-10 ENCOUNTER — Telehealth: Payer: Self-pay | Admitting: Nurse Practitioner

## 2023-11-10 ENCOUNTER — Other Ambulatory Visit: Payer: Self-pay | Admitting: Nurse Practitioner

## 2023-11-10 DIAGNOSIS — Z794 Long term (current) use of insulin: Secondary | ICD-10-CM

## 2023-11-10 MED ORDER — INSULIN NPH ISOPHANE & REGULAR (70-30) 100 UNIT/ML ~~LOC~~ SUSP
SUBCUTANEOUS | 3 refills | Status: DC
Start: 2023-11-10 — End: 2024-03-18

## 2023-11-10 NOTE — Telephone Encounter (Signed)
Requesting refill on insulin 70-30 NPH-regular. Please send to [id number] - [place], [place] - [address]

## 2023-11-11 ENCOUNTER — Ambulatory Visit (INDEPENDENT_AMBULATORY_CARE_PROVIDER_SITE_OTHER): Payer: Medicare Other | Admitting: Family Medicine

## 2023-11-11 ENCOUNTER — Encounter: Payer: Self-pay | Admitting: Family Medicine

## 2023-11-11 VITALS — BP 116/74 | HR 88 | Ht 63.0 in | Wt 233.5 lb

## 2023-11-11 DIAGNOSIS — G4733 Obstructive sleep apnea (adult) (pediatric): Secondary | ICD-10-CM | POA: Diagnosis not present

## 2023-11-16 ENCOUNTER — Encounter (INDEPENDENT_AMBULATORY_CARE_PROVIDER_SITE_OTHER): Payer: Medicare Other | Admitting: Ophthalmology

## 2023-11-16 DIAGNOSIS — H43813 Vitreous degeneration, bilateral: Secondary | ICD-10-CM | POA: Diagnosis not present

## 2023-11-16 DIAGNOSIS — I10 Essential (primary) hypertension: Secondary | ICD-10-CM | POA: Diagnosis not present

## 2023-11-16 DIAGNOSIS — H35033 Hypertensive retinopathy, bilateral: Secondary | ICD-10-CM

## 2023-11-16 DIAGNOSIS — H34812 Central retinal vein occlusion, left eye, with macular edema: Secondary | ICD-10-CM

## 2023-11-16 IMAGING — MG MM DIGITAL SCREENING BILAT W/ TOMO AND CAD
6 of 12 series · 6 of 36 positions shown · non-contrast
Comparison: Previous exam(s).

CLINICAL DATA: Screening.

EXAM:
DIGITAL SCREENING BILATERAL MAMMOGRAM WITH TOMOSYNTHESIS AND CAD
TECHNIQUE: Bilateral screening digital craniocaudal and mediolateral oblique
mammograms were obtained. Bilateral screening digital breast
tomosynthesis was performed. The images were evaluated with
computer-aided detection.

[L CC synth-2D (1 of 2)]
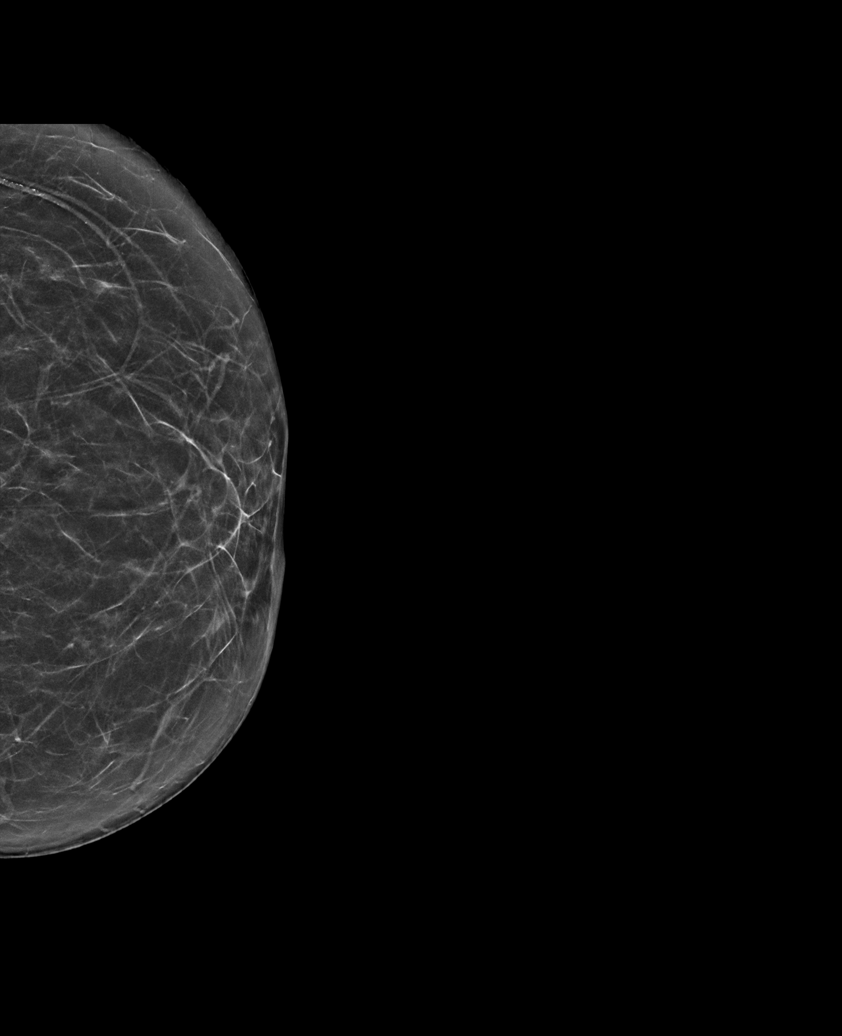

[R CC synth-2D (1 of 2)]
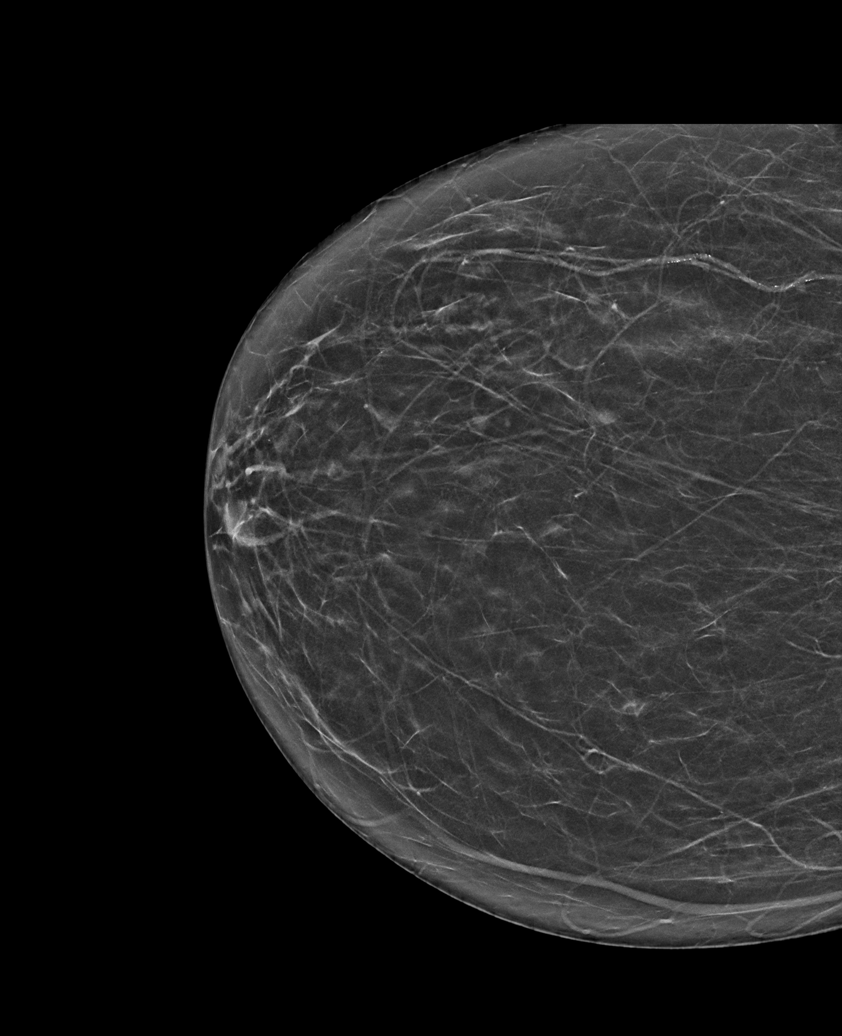

[R MLO synth-2D]
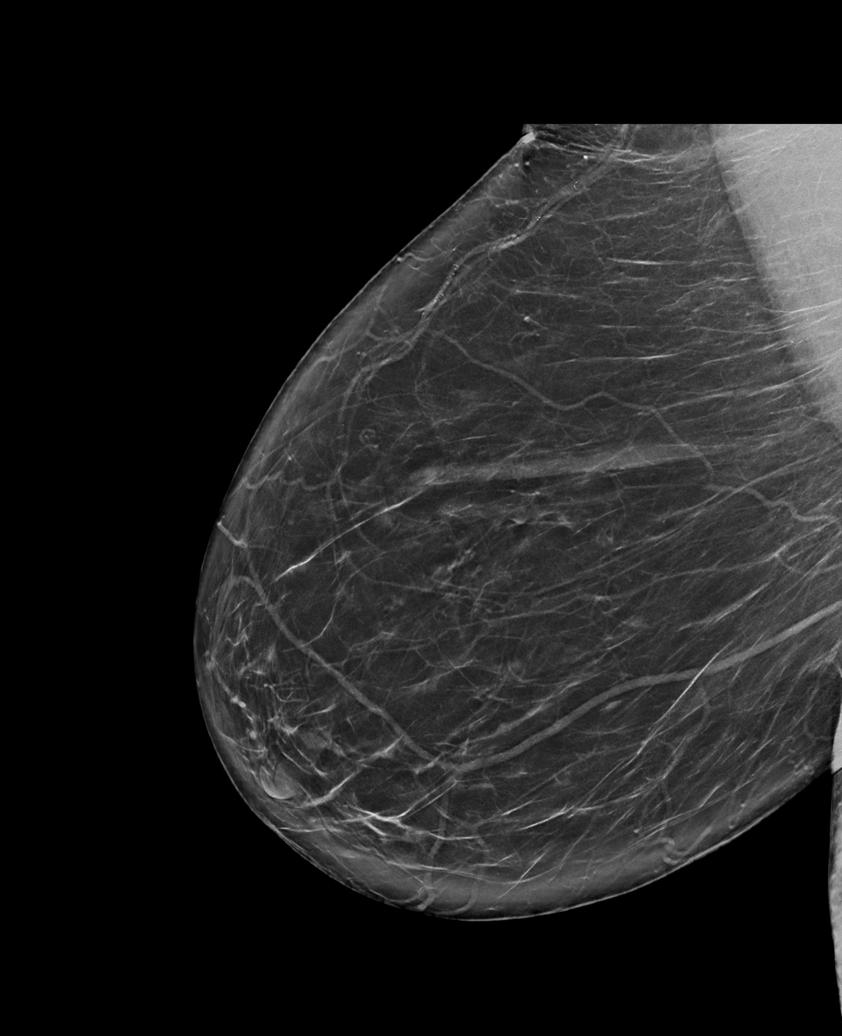

[R CC synth-2D (2 of 2)]
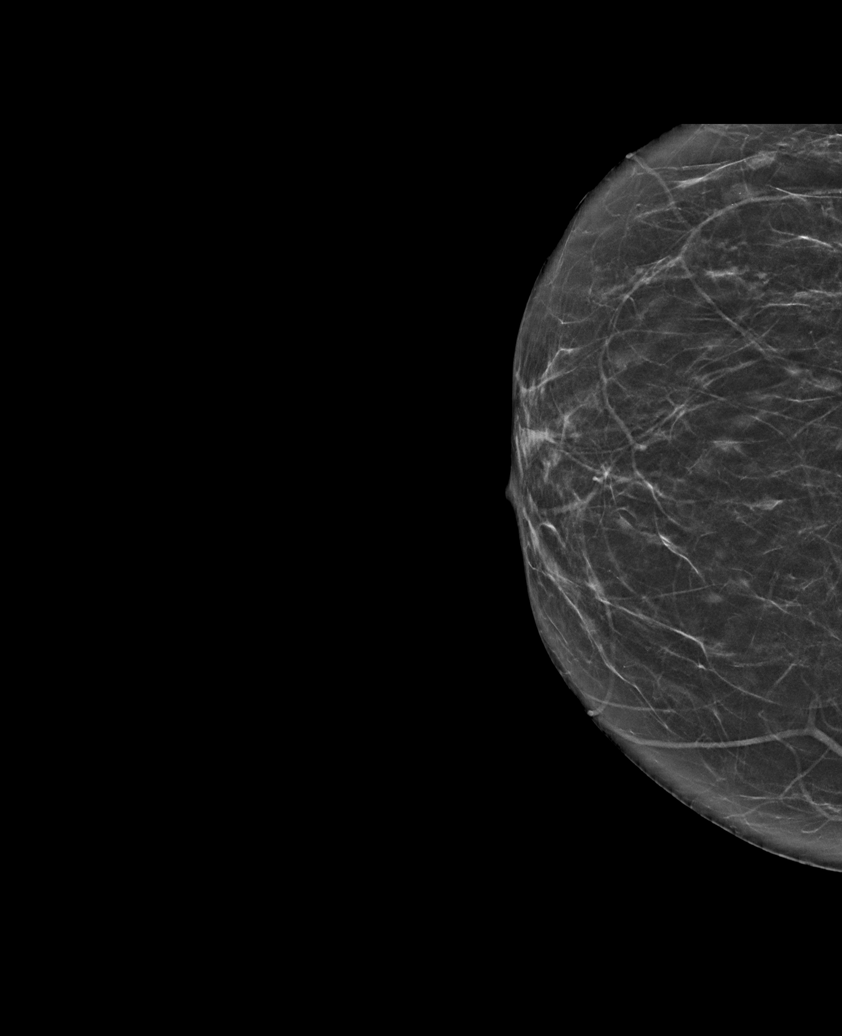

[L CC synth-2D (2 of 2)]
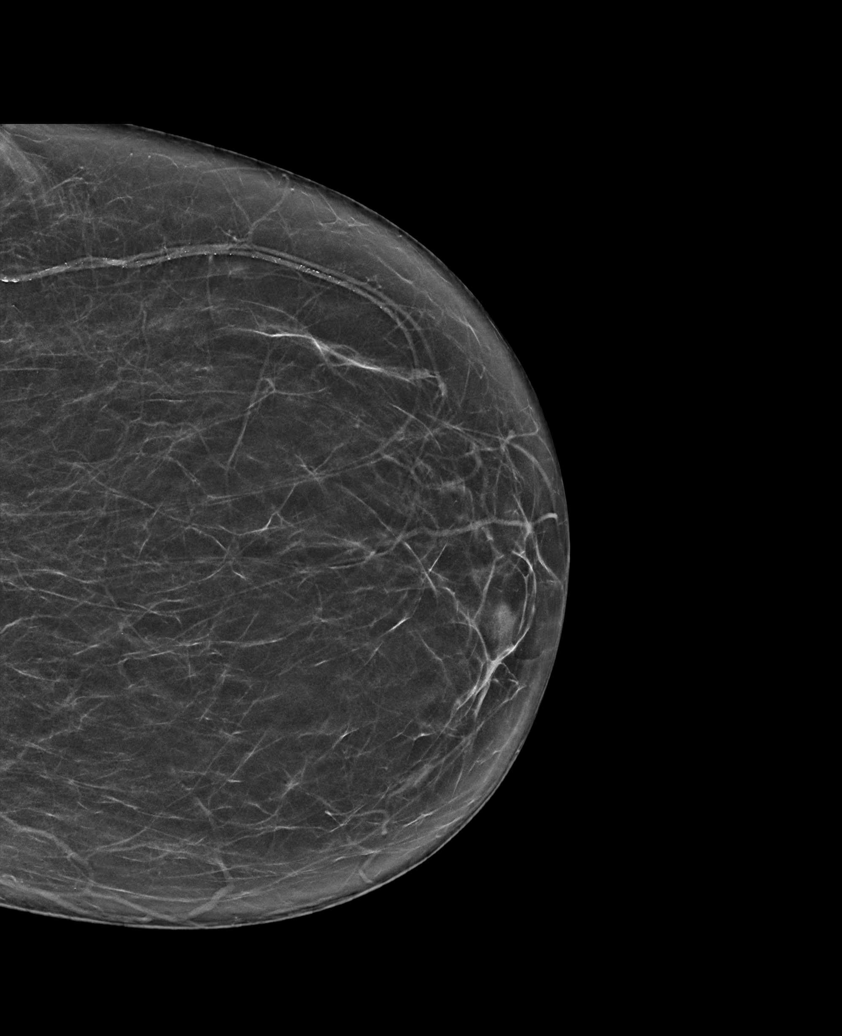

[L MLO synth-2D]
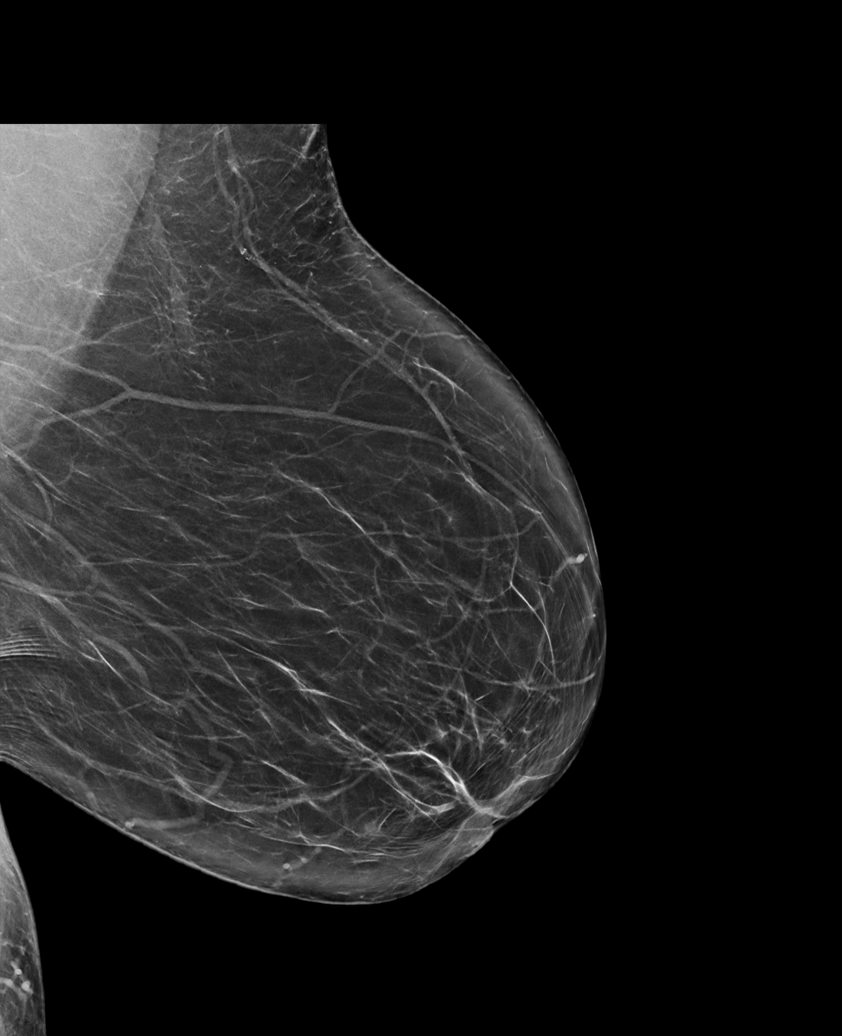

[6 of 36 positions shown; findings below may reference images not displayed]

ACR Breast Density Category b: There are scattered areas of
fibroglandular density.
FINDINGS: There are no findings suspicious for malignancy.
IMPRESSION: No mammographic evidence of malignancy. A result letter of this
screening mammogram will be mailed directly to the patient.

RECOMMENDATION:
Screening mammogram in one year. (Code:51-O-LD2)

BI-RADS CATEGORY  1: Negative.

## 2023-12-14 ENCOUNTER — Encounter (INDEPENDENT_AMBULATORY_CARE_PROVIDER_SITE_OTHER): Payer: Medicare Other | Admitting: Ophthalmology

## 2023-12-14 DIAGNOSIS — H34812 Central retinal vein occlusion, left eye, with macular edema: Secondary | ICD-10-CM

## 2023-12-14 DIAGNOSIS — H43813 Vitreous degeneration, bilateral: Secondary | ICD-10-CM

## 2023-12-14 DIAGNOSIS — I10 Essential (primary) hypertension: Secondary | ICD-10-CM | POA: Diagnosis not present

## 2023-12-14 DIAGNOSIS — H35033 Hypertensive retinopathy, bilateral: Secondary | ICD-10-CM | POA: Diagnosis not present

## 2023-12-15 ENCOUNTER — Other Ambulatory Visit: Payer: Self-pay | Admitting: Nurse Practitioner

## 2023-12-15 DIAGNOSIS — E039 Hypothyroidism, unspecified: Secondary | ICD-10-CM

## 2023-12-22 LAB — HM DIABETES EYE EXAM

## 2023-12-24 ENCOUNTER — Encounter: Payer: Self-pay | Admitting: Internal Medicine

## 2023-12-30 ENCOUNTER — Encounter: Payer: Self-pay | Admitting: Internal Medicine

## 2023-12-30 NOTE — Progress Notes (Signed)
 [place]      ADULT   &   ADOLESCENT      INTERNAL MEDICINE  Jocelyn Camacho, M.D.          Lonell Rous, ANP        Tonya Cranford, FNP  [facility] [address]  [place], [place]. [address] Telephone [phone] Telefax [phone]    Future Appointments  Date Time Provider Department  12/31/2023                    3 mo ov  3:30 PM [name], MD [facility]  01/18/2024  7:55 AM Jocelyn Norleen BIRCH, MD [facility]  09/22/2024                    cpe  9:00 AM Wilkinson, Jocelyn E, NP [facility]  11/16/2024 10:30 AM Jocelyn No, NP [facility]    History of Present Illness:       This very nice 78 y.o. MBF  presents for 3 month follow up with HTN, HLD, Pre-Diabetes and Vitamin D  Deficiency.  Her Gout is controlled on her Allopurinol .  Patient also has OSA & infreq uses her CPAP.       Patient is treated for HTN (1984) & BP has been controlled at home. Today's BP was initially elevated & rechecked at goal - 138/80 . Patient has had Camacho complaints of any cardiac type chest pain, palpitations, dyspnea/ orthopnea /PND, dizziness, claudication, or dependent edema.      Hyperlipidemia is controlled with diet & Rosuvastatin . Patient denies myalgias or other med SE's. 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  She also has 2 0  hyperparathyroidism.  Her Nephrologist is  Dr Gordy Blanch.  Patient has  had Camacho symptoms of reactive hypoglycemia, diabetic polys, paresthesias or visual blurring.  Last A1c was at goal :  Lab Results  Component Value Date   HGBA1C 5.2 12/31/2023     Wt Readings from Last 3 Encounters:  10/24/21 250 lb   09/25/21 250 lb 6.4  oz   08/08/21 253 lb                                                    Patient was started on thyroid  replacement when dx'd Hypothyroid in 2004.                                                   Further, the patient also has history of Vitamin D  Deficiency (38 /2016) and supplements vitamin D  without any suspected side-effects. Last vitamin D  was at goal :  Lab Results  Component Value Date   VD25OH 25 12/31/2023      Current Outpatient Medications on File Prior to Visit  Medication Sig  allopurinol   300 MG tablet Take  1/2 tablet (150 mg)  Daily     ALPRAZolam  0.5 MG tablet Take 1 tablet at bedtime as needed    VITAMIN C 1000 MG tablet Take daily.   aspirin  EC 81 MG tablet Take daily.   atenolol  100 MG tablet Take 1 tablet  once daily   BLACK PEPPER-TURMERIC  Take daily.   VITAMIN D  5,000 u Take 10,000 Units daily.   colchicine  0.6 MG tablet TAKE 1 TABLET  DAILY FOR GOUT    Cyanocobalamin (B-12) 1000 MCG SUBL Place 1 tablet under the tongue 2  times a week.   COSOPT  ophthalmic solution Place 1 drop into the left eye 2  times daily.   TRULICITY  1.5 MG/0.5ML SOPN Inject 1.5 mg into the skin once a week.   Finerenone  (KERENDIA ) 10 MG TABS Take  daily for Diabetic Kidneys   furosemide  (80 MG tablet TAKE 1 TABLET 1-2 x/day AS NEEDED    Hyoscyamine  0.125 MG tablet Take 1 tablet  every 6  hours as needed.   insulin  NPH-regular Human (70-30)  Patient injects 20 u  AM &   8 u PM   levothyroxine  125 MCG tablet TAKE 1 TABLET DAILY    polymyxin B 500,000 Units in NaCl irrigation  Irrigate with every 30 days  w/her eye inj.   rosuvastatin  5 MG tablet Take 1 tablet weekly   Zinc  50 MG tablet Take daily.     Allergies  Allergen Reactions   Penicillins Swelling   Sulfa  Antibiotics Itching and Rash    PMHx:   Past Medical History:  Diagnosis Date   Allergic rhinitis    Allergy    Anxiety    with medical procedures   Hyperlipidemia    Hypertension    Obesity    OSA  (obstructive sleep apnea)    wears CPAP   Type II  diabetes mellitus (250.40)    hypothyroidism    Vitamin D  deficiency      Immunization History  Administered Date(s) Administered   DT  06/19/2015   DTaP 12/09/2004   PFIZER Covid-19 Tri-Sucrose Vacc 04/18/2021   PFIZER SARS-COV-2 Vacc 02/03/2020, 02/28/2020, 10/09/2020   Pneumococcal -13 12/19/2014   Pneumococcal -23 03/02/2009, 01/17/2019     Past Surgical History:  Procedure Laterality Date    eye procedure     have treatment where a needle is stuck into left eye- since June 2014; now every six weeks   DENTAL EXAMINATION UNDER ANESTHESIA     TUBAL LIGATION      FHx:    Reviewed / unchanged  SHx:    Reviewed / unchanged   Systems Review:  Constitutional: Denies fever, chills, wt changes, headaches, insomnia, fatigue, night sweats, change in appetite. Eyes: Denies redness, blurred vision, diplopia, discharge, itchy, watery eyes.  ENT: Denies discharge, congestion, post nasal drip, epistaxis, sore throat, earache, hearing loss, dental pain, tinnitus, vertigo, sinus pain, snoring.  CV: Denies chest pain, palpitations, irregular heartbeat, syncope, dyspnea, diaphoresis, orthopnea, PND, claudication or edema. Respiratory: denies cough, dyspnea, DOE, pleurisy, hoarseness, laryngitis, wheezing.  Gastrointestinal: Denies dysphagia, odynophagia, heartburn, reflux, water brash, abdominal pain or cramps, nausea, vomiting, bloating, diarrhea, constipation, hematemesis, melena, hematochezia  or hemorrhoids. Genitourinary: Denies dysuria, frequency, urgency, nocturia, hesitancy, discharge, hematuria or flank pain. Musculoskeletal: Denies arthralgias, myalgias, stiffness, jt. swelling, pain, limping or strain/sprain.  Skin: Denies pruritus, rash, hives, warts, acne, eczema or change in skin lesion(s). Neuro: Camacho weakness, tremor, incoordination, spasms, paresthesia or  pain. Psychiatric: Denies confusion, memory loss or sensory  loss. Endo: Denies change in weight, skin or hair change.  Heme/Lymph: Camacho excessive bleeding, bruising or enlarged lymph nodes.  Physical Exam  BP 138/80   Pulse 90   Temp 97.9 F (36.6 C)   Resp 17   Ht 5' 3 (1.6 m)   Wt 225 lb 9.6 oz (102.3 kg)   SpO2 98%   BMI 39.96 kg/m   Appears  severely obese and in Camacho distress.  Eyes: PERRLA, EOMs, conjunctiva Camacho swelling or erythema. Sinuses: Camacho frontal/maxillary tenderness ENT/Mouth: EAC's clear, TM's nl w/o erythema, bulging. Nares clear w/o erythema, swelling, exudates. Oropharynx clear without erythema or exudates. Oral hygiene is good. Tongue normal, non obstructing. Hearing intact.  Neck: Supple. Thyroid  not palpable. Car 2+/2+ without bruits, nodes or JVD. Chest: Respirations nl with BS clear & equal w/o rales, rhonchi, wheezing or stridor.  Cor: Heart sounds normal w/ regular rate and rhythm without sig. murmurs, gallops, clicks or rubs. Peripheral pulses normal and equal  without edema.  Abdomen: Soft & bowel sounds normal. Non-tender w/o guarding, rebound, hernias, masses or organomegaly.  Lymphatics: Unremarkable.  Musculoskeletal: Full ROM all peripheral extremities, joint stability, 5/5 strength and normal gait.  Skin: Warm, dry without exposed rashes, lesions or ecchymosis apparent.  Neuro: Cranial nerves intact, reflexes equal bilaterally. Sensory-motor testing grossly intact. Tendon reflexes grossly intact.  Pysch: Alert & oriented x 3.  Insight and judgement nl & appropriate. Camacho ideations.  Assessment and Plan:  1. Essential hypertension  - Continue medication, monitor blood pressure at home.  - Continue DASH diet.  Reminder to go to the ER if any CP,  SOB, nausea, dizziness, severe HA, changes vision/speech.    - CBC with Differential/Platelet - COMPLETE METABOLIC PANEL WITH GFR - Magnesium - TSH   2. Hyperlipidemia associated with type 2 diabetes mellitus (HCC)  - Continue diet/meds, exercise,& lifestyle  modifications.  - Continue monitor periodic cholesterol/liver & renal functions     - Lipid panel - TSH   3. Type 2 diabetes mellitus with stage 3b chronic kidney  disease, with long-term current use of insulin  (HCC)  - Continue diet, exercise  - Lifestyle modifications.  - Monitor appropriate labs   - Fructosamine   4. Vitamin D  deficiency  - Continue supplementation    5. Hyperparathyroidism, secondary renal (HCC)  - COMPLETE METABOLIC PANEL WITH GFR   6. Hypothyroidism  - TSH  7. Chronic gout without tophus  - Uric acid   8. Medication management  - CBC with Differential/Platelet - COMPLETE METABOLIC PANEL WITH GFR - Magnesium - Lipid panel - TSH - Fructosamine - Uric acid         Discussed  regular exercise, BP monitoring, weight control to achieve/maintain BMI less than 25 and discussed med and SE's. Recommended labs to assess and monitor clinical status with further disposition pending results of labs.  I discussed the assessment and treatment plan with the patient. The patient was provided an opportunity to ask questions and all were answered. The patient agreed with the plan and demonstrated an understanding of the instructions.  I provided over 30 minutes of exam, counseling, chart review and  complex critical decision making.        The patient was advised to call back or seek an in-person evaluation if the symptoms worsen or if the condition fails to improve as anticipated.   Jocelyn JONETTA Richards, MD

## 2023-12-30 NOTE — Patient Instructions (Addendum)
 Due to recent changes in healthcare laws, you may see the results of your imaging and laboratory studies on MyChart before your provider has had a chance to review them.  We understand that in some cases there may be results that are confusing or concerning to you. Not all laboratory results come back in the same time frame and the provider may be waiting for multiple results in order to interpret others.  Please give [place] 48 hours in order for your provider to thoroughly review all the results before contacting the office for clarification of your results.  ++++++++++++++++++++++++++  Vit D  & Vit C 1,000 mg   are recommended to help protect  against the Covid-19 and other Corona viruses.    Also it's recommended  to take  Zinc 50 mg  to help  protect against the Covid-19   and best place to get  is also on [employer].com  and don't pay more than 6-8 cents /pill !   +++++++++++++++++++++++++++++++++++++++ Recommend Adult Low Dose Aspirin or  coated  Aspirin 81 mg daily  To reduce risk of Colon Cancer 40 %,  Skin Cancer 26 % ,  Melanoma 46%  and  Pancreatic cancer 60% +++++++++++++++++++++++++++++++++++++++++ Vitamin D goal  is between 70-100.  Please make sure that you are taking your Vitamin D as directed.  It is very important as a natural anti-inflammatory  helping hair, skin, and nails, as well as reducing stroke and heart attack risk.  It helps your bones and helps with mood. It also decreases numerous cancer risks so please take it as directed.  Low Vit D is associated with a 200-300% higher risk for CANCER  and 200-300% higher risk for HEART   ATTACK  &  STROKE.   .....................................Marland Kitchen It is also associated with higher death rate at younger ages,  autoimmune diseases like Rheumatoid arthritis, Lupus, Multiple Sclerosis.    Also many other serious conditions, like depression, Alzheimer's Dementia, infertility, muscle aches, fatigue, fibromyalgia - just to name  a few. +++++++++++++++++++++++++++++++++++++++++ Recommend the book "The END of DIETING" by Dr Monico Hoar  & the book "The END of DIABETES " by Dr Monico Hoar At [email or web address] - get book & Audio CD's    Being diabetic has a  300% increased risk for heart attack, stroke, cancer, and alzheimer- type vascular dementia. It is very important that you work harder with diet by avoiding all foods that are white. Avoid white rice (brown & wild rice is OK), white potatoes (sweetpotatoes in moderation is OK), White bread or wheat bread or anything made out of white flour like bagels, donuts, rolls, buns, biscuits, cakes, pastries, cookies, pizza crust, and pasta (made from white flour & egg whites) - vegetarian pasta or spinach or wheat pasta is OK. Multigrain breads like Arnold's or [employer], or multigrain sandwich thins or flatbreads.  Diet, exercise and weight loss can reverse and cure diabetes in the early stages.  Diet, exercise and weight loss is very important in the control and prevention of complications of diabetes which affects every system in your body, ie. Brain - dementia/stroke, eyes - glaucoma/blindness, heart - heart attack/heart failure, kidneys - dialysis, stomach - gastric paralysis, intestines - malabsorption, nerves - severe painful neuritis, circulation - gangrene & loss of a leg(s), and finally cancer and Alzheimers.    I recommend avoid fried & greasy foods,  sweets/candy, white rice (brown or wild rice or Quinoa is OK), white potatoes (sweet potatoes are OK) - anything  made from white flour - bagels, doughnuts, rolls, buns, biscuits,white and wheat breads, pizza crust and traditional pasta made of white flour & egg white(vegetarian pasta or spinach or wheat pasta is OK).  Multi-grain bread is OK - like multi-grain flat bread or sandwich thins. Avoid alcohol in excess. Exercise is also important.    Eat all the vegetables you want - avoid meat, especially red meat and dairy - especially  cheese.  Cheese is the most concentrated form of trans-fats which is the worst thing to clog up our arteries. Veggie cheese is OK which can be found in the fresh produce section at [employer] or [employer] or [employer]  +++++++++++++++++++++++++++++++++++++++ DASH Eating Plan  DASH stands for "Dietary Approaches to Stop Hypertension."   The DASH eating plan is a healthy eating plan that has been shown to reduce high blood pressure (hypertension). Additional health benefits may include reducing the risk of type 2 diabetes mellitus, heart disease, and stroke. The DASH eating plan may also help with weight loss. WHAT DO I NEED TO KNOW ABOUT THE DASH EATING PLAN? For the DASH eating plan, you will follow these general guidelines: Choose foods with a percent daily value for sodium of less than 5% (as listed on the food label). Use salt-free seasonings or herbs instead of table salt or sea salt. Check with your health care provider or pharmacist before using salt substitutes. Eat lower-sodium products, often labeled as "lower sodium" or "no salt added." Eat fresh foods. Eat more vegetables, fruits, and low-fat dairy products. Choose whole grains. Look for the word "whole" as the first word in the ingredient list. Choose fish  Limit sweets, desserts, sugars, and sugary drinks. Choose heart-healthy fats. Eat veggie cheese  Eat more home-cooked food and less restaurant, buffet, and fast food. Limit fried foods. Cook foods using methods other than frying. Limit canned vegetables. If you do use them, rinse them well to decrease the sodium. When eating at a restaurant, ask that your food be prepared with less salt, or no salt if possible.                      WHAT FOODS CAN I EAT? Read Dr Francis Dowse Fuhrman's books on The End of Dieting & The End of Diabetes  Grains Whole grain or whole wheat bread. Brown rice. Whole grain or whole wheat pasta. Quinoa, bulgur, and whole grain cereals. Low-sodium  cereals. Corn or whole wheat flour tortillas. Whole grain cornbread. Whole grain crackers. Low-sodium crackers.  Vegetables Fresh or frozen vegetables (raw, steamed, roasted, or grilled). Low-sodium or reduced-sodium tomato and vegetable juices. Low-sodium or reduced-sodium tomato sauce and paste. Low-sodium or reduced-sodium canned vegetables.   Fruits All fresh, canned (in natural juice), or frozen fruits.  Protein Products  All fish and seafood.  Dried beans, peas, or lentils. Unsalted nuts and seeds. Unsalted canned beans.  Dairy Low-fat dairy products, such as skim or 1% milk, 2% or reduced-fat cheeses, low-fat ricotta or cottage cheese, or plain low-fat yogurt. Low-sodium or reduced-sodium cheeses.  Fats and Oils Tub margarines without trans fats. Light or reduced-fat mayonnaise and salad dressings (reduced sodium). Avocado. Safflower, olive, or canola oils. Natural peanut or almond butter.  Other Unsalted popcorn and pretzels. The items listed above may not be a complete list of recommended foods or beverages. Contact your dietitian for more options.  +++++++++++++++  WHAT FOODS ARE NOT RECOMMENDED? Grains/ White flour or wheat flour White bread. White pasta. White rice. Refined  cornbread. Bagels and croissants. Crackers that contain trans fat.  Vegetables  Creamed or fried vegetables. Vegetables in a . Regular canned vegetables. Regular canned tomato sauce and paste. Regular tomato and vegetable juices.  Fruits Dried fruits. Canned fruit in light or heavy syrup. Fruit juice.  Meat and Other Protein Products Meat in general - RED meat & White meat.  Fatty cuts of meat. Ribs, chicken wings, all processed meats as bacon, sausage, bologna, salami, fatback, hot dogs, bratwurst and packaged luncheon meats.  Dairy Whole or 2% milk, cream, half-and-half, and cream cheese. Whole-fat or sweetened yogurt. Full-fat cheeses or blue cheese. Non-dairy creamers and whipped toppings.  Processed cheese, cheese spreads, or cheese curds.  Condiments Onion and garlic salt, seasoned salt, table salt, and sea salt. Canned and packaged gravies. Worcestershire sauce. Tartar sauce. Barbecue sauce. Teriyaki sauce. Soy sauce, including reduced sodium. Steak sauce. Fish sauce. Oyster sauce. Cocktail sauce. Horseradish. Ketchup and mustard. Meat flavorings and tenderizers. Bouillon cubes. Hot sauce. Tabasco sauce. Marinades. Taco seasonings. Relishes.  Fats and Oils Butter, stick margarine, lard, shortening and bacon fat. Coconut, palm kernel, or palm oils. Regular salad dressings.  Pickles and olives. Salted popcorn and pretzels.  The items listed above may not be a complete list of foods and beverages to avoid.

## 2023-12-31 ENCOUNTER — Encounter: Payer: Self-pay | Admitting: Internal Medicine

## 2023-12-31 ENCOUNTER — Ambulatory Visit (INDEPENDENT_AMBULATORY_CARE_PROVIDER_SITE_OTHER): Payer: Medicare Other | Admitting: Internal Medicine

## 2023-12-31 VITALS — BP 138/80 | HR 90 | Temp 97.9°F | Resp 17 | Ht 63.0 in | Wt 225.6 lb

## 2023-12-31 DIAGNOSIS — E1122 Type 2 diabetes mellitus with diabetic chronic kidney disease: Secondary | ICD-10-CM | POA: Diagnosis not present

## 2023-12-31 DIAGNOSIS — M1000 Idiopathic gout, unspecified site: Secondary | ICD-10-CM

## 2023-12-31 DIAGNOSIS — E039 Hypothyroidism, unspecified: Secondary | ICD-10-CM

## 2023-12-31 DIAGNOSIS — E785 Hyperlipidemia, unspecified: Secondary | ICD-10-CM

## 2023-12-31 DIAGNOSIS — N1831 Chronic kidney disease, stage 3a: Secondary | ICD-10-CM

## 2023-12-31 DIAGNOSIS — E1169 Type 2 diabetes mellitus with other specified complication: Secondary | ICD-10-CM

## 2023-12-31 DIAGNOSIS — Z79899 Other long term (current) drug therapy: Secondary | ICD-10-CM

## 2023-12-31 DIAGNOSIS — Z794 Long term (current) use of insulin: Secondary | ICD-10-CM

## 2023-12-31 DIAGNOSIS — I10 Essential (primary) hypertension: Secondary | ICD-10-CM

## 2023-12-31 DIAGNOSIS — E559 Vitamin D deficiency, unspecified: Secondary | ICD-10-CM | POA: Diagnosis not present

## 2023-12-31 DIAGNOSIS — N2581 Secondary hyperparathyroidism of renal origin: Secondary | ICD-10-CM

## 2024-01-01 LAB — CBC WITH DIFFERENTIAL/PLATELET
Absolute Lymphocytes: 2831 {cells}/uL (ref 850–3900)
Absolute Monocytes: 663 {cells}/uL (ref 200–950)
Basophils Absolute: 68 {cells}/uL (ref 0–200)
Basophils Relative: 0.8 %
Eosinophils Absolute: 170 {cells}/uL (ref 15–500)
Eosinophils Relative: 2 %
HCT: 36.5 % (ref 35.0–45.0)
Hemoglobin: 11.8 g/dL (ref 11.7–15.5)
MCH: 28.8 pg (ref 27.0–33.0)
MCHC: 32.3 g/dL (ref 32.0–36.0)
MCV: 89 fL (ref 80.0–100.0)
MPV: 12.4 fL (ref 7.5–12.5)
Monocytes Relative: 7.8 %
Neutro Abs: 4769 {cells}/uL (ref 1500–7800)
Neutrophils Relative %: 56.1 %
Platelets: 396 10*3/uL (ref 140–400)
RBC: 4.1 10*6/uL (ref 3.80–5.10)
RDW: 14.6 % (ref 11.0–15.0)
Total Lymphocyte: 33.3 %
WBC: 8.5 10*3/uL (ref 3.8–10.8)

## 2024-01-01 LAB — COMPLETE METABOLIC PANEL WITH GFR
AG Ratio: 1.5 (calc) (ref 1.0–2.5)
ALT: 10 U/L (ref 6–29)
AST: 20 U/L (ref 10–35)
Albumin: 4.3 g/dL (ref 3.6–5.1)
Alkaline phosphatase (APISO): 118 U/L (ref 37–153)
BUN/Creatinine Ratio: 15 (calc) (ref 6–22)
BUN: 22 mg/dL (ref 7–25)
CO2: 32 mmol/L (ref 20–32)
Calcium: 10 mg/dL (ref 8.6–10.4)
Chloride: 105 mmol/L (ref 98–110)
Creat: 1.46 mg/dL — ABNORMAL HIGH (ref 0.60–1.00)
Globulin: 2.9 g/dL (ref 1.9–3.7)
Glucose, Bld: 68 mg/dL (ref 65–99)
Potassium: 4.3 mmol/L (ref 3.5–5.3)
Sodium: 145 mmol/L (ref 135–146)
Total Bilirubin: 0.8 mg/dL (ref 0.2–1.2)
Total Protein: 7.2 g/dL (ref 6.1–8.1)
eGFR: 37 mL/min/{1.73_m2} — ABNORMAL LOW (ref >=60)

## 2024-01-01 LAB — HEMOGLOBIN A1C
Hgb A1c MFr Bld: 5.2 %{Hb} (ref <5.7)
Mean Plasma Glucose: 103 mg/dL
eAG (mmol/L): 5.7 mmol/L

## 2024-01-01 LAB — VITAMIN D 25 HYDROXY (VIT D DEFICIENCY, FRACTURES): Vit D, 25-Hydroxy: 70 ng/mL (ref 30–100)

## 2024-01-01 LAB — LIPID PANEL
Cholesterol: 100 mg/dL (ref <200)
HDL: 37 mg/dL — ABNORMAL LOW (ref >=50)
LDL Cholesterol (Calc): 44 mg/dL
Non-HDL Cholesterol (Calc): 63 mg/dL (ref <130)
Total CHOL/HDL Ratio: 2.7 (calc) (ref <5.0)
Triglycerides: 104 mg/dL (ref <150)

## 2024-01-01 LAB — MAGNESIUM: Magnesium: 2.4 mg/dL (ref 1.5–2.5)

## 2024-01-01 LAB — TSH: TSH: 0.47 m[IU]/L (ref 0.40–4.50)

## 2024-01-01 LAB — URIC ACID: Uric Acid, Serum: 6.7 mg/dL (ref 2.5–7.0)

## 2024-01-01 LAB — PARATHYROID HORMONE, INTACT (NO CA): PTH: 175 pg/mL — ABNORMAL HIGH (ref 16–77)

## 2024-01-03 ENCOUNTER — Encounter: Payer: Self-pay | Admitting: Internal Medicine

## 2024-01-03 NOTE — Progress Notes (Signed)
=========================================================================  -    kidney functions ( GFR) have decreased 40 & 47  to now 37 almost in Stage 4     - Getting closer to Dialysis     IF   -You don't work harder on diet & lose weight  AND  DRINK MORE  FLUIDS  Fluids    Very important to drink adequate amounts of fluids to prevent permanent damage    - Recommend drink at least 6 bottles (16 ounces) of fluids /water /day = 96 Oz ~100 oz  - 100 oz = 3,000 cc or 3 liters / day  - >> That's 1 &1/2 bottles of a 2 liter soda bottle /day !   =========================================================================  -  Chol = 100 & LDL = 44 - Both  are     Excellent   - Very low risk for Heart Attack  / Stroke  =========================================================================  -  PTH hormone = 175 is about 2 &1/2 times the upper limit of normal  ( normal is less than 77  ! )                         - And is a result of your kidney function worsening !                                                                                - So its very important that you lose Weight   =========================================================================  -   -  A1c = 5.2 so your meds for Diabetes are working ,                                               but you need to do your part with a better diet &                                                                                                                                 LOSE WEIGHT   =========================================================================

## 2024-01-18 ENCOUNTER — Encounter (INDEPENDENT_AMBULATORY_CARE_PROVIDER_SITE_OTHER): Payer: Medicare Other | Admitting: Ophthalmology

## 2024-01-18 DIAGNOSIS — H35033 Hypertensive retinopathy, bilateral: Secondary | ICD-10-CM | POA: Diagnosis not present

## 2024-01-18 DIAGNOSIS — H43813 Vitreous degeneration, bilateral: Secondary | ICD-10-CM

## 2024-01-18 DIAGNOSIS — H34812 Central retinal vein occlusion, left eye, with macular edema: Secondary | ICD-10-CM

## 2024-01-18 DIAGNOSIS — I10 Essential (primary) hypertension: Secondary | ICD-10-CM

## 2024-02-02 ENCOUNTER — Other Ambulatory Visit (HOSPITAL_COMMUNITY): Payer: Self-pay

## 2024-02-16 ENCOUNTER — Other Ambulatory Visit: Payer: Self-pay | Admitting: Family Medicine

## 2024-02-16 DIAGNOSIS — Z1231 Encounter for screening mammogram for malignant neoplasm of breast: Secondary | ICD-10-CM

## 2024-02-22 ENCOUNTER — Encounter (INDEPENDENT_AMBULATORY_CARE_PROVIDER_SITE_OTHER): Payer: Medicare Other | Admitting: Ophthalmology

## 2024-02-22 DIAGNOSIS — I10 Essential (primary) hypertension: Secondary | ICD-10-CM

## 2024-02-22 DIAGNOSIS — H43813 Vitreous degeneration, bilateral: Secondary | ICD-10-CM | POA: Diagnosis not present

## 2024-02-22 DIAGNOSIS — H34812 Central retinal vein occlusion, left eye, with macular edema: Secondary | ICD-10-CM | POA: Diagnosis not present

## 2024-02-22 DIAGNOSIS — H35033 Hypertensive retinopathy, bilateral: Secondary | ICD-10-CM

## 2024-02-24 ENCOUNTER — Ambulatory Visit
Admission: RE | Admit: 2024-02-24 | Discharge: 2024-02-24 | Disposition: A | Discharge status: Home / Self Care | Payer: Medicare Other | Source: Ambulatory Visit | Attending: Family Medicine | Admitting: Family Medicine

## 2024-02-24 DIAGNOSIS — Z1231 Encounter for screening mammogram for malignant neoplasm of breast: Secondary | ICD-10-CM

## 2024-03-18 ENCOUNTER — Ambulatory Visit (INDEPENDENT_AMBULATORY_CARE_PROVIDER_SITE_OTHER): Payer: Medicare Other | Admitting: Family Medicine

## 2024-03-18 ENCOUNTER — Encounter: Payer: Self-pay | Admitting: Family Medicine

## 2024-03-18 VITALS — BP 134/80 | HR 83 | Temp 97.7°F | Ht 63.0 in | Wt 229.0 lb

## 2024-03-18 DIAGNOSIS — I10 Essential (primary) hypertension: Secondary | ICD-10-CM

## 2024-03-18 DIAGNOSIS — M1A9XX0 Chronic gout, unspecified, without tophus (tophi): Secondary | ICD-10-CM

## 2024-03-18 DIAGNOSIS — M1000 Idiopathic gout, unspecified site: Secondary | ICD-10-CM | POA: Diagnosis not present

## 2024-03-18 DIAGNOSIS — E1122 Type 2 diabetes mellitus with diabetic chronic kidney disease: Secondary | ICD-10-CM

## 2024-03-18 DIAGNOSIS — E039 Hypothyroidism, unspecified: Secondary | ICD-10-CM

## 2024-03-18 DIAGNOSIS — N1831 Chronic kidney disease, stage 3a: Secondary | ICD-10-CM

## 2024-03-18 DIAGNOSIS — Z794 Long term (current) use of insulin: Secondary | ICD-10-CM

## 2024-03-18 DIAGNOSIS — E1169 Type 2 diabetes mellitus with other specified complication: Secondary | ICD-10-CM

## 2024-03-18 DIAGNOSIS — I878 Other specified disorders of veins: Secondary | ICD-10-CM

## 2024-03-18 DIAGNOSIS — N1832 Chronic kidney disease, stage 3b: Secondary | ICD-10-CM

## 2024-03-18 DIAGNOSIS — E785 Hyperlipidemia, unspecified: Secondary | ICD-10-CM

## 2024-03-18 DIAGNOSIS — Z7689 Persons encountering health services in other specified circumstances: Secondary | ICD-10-CM

## 2024-03-18 MED ORDER — ALLOPURINOL 300 MG PO TABS
ORAL_TABLET | ORAL | 2 refills | Status: DC
Start: 2024-03-18 — End: 2024-09-20

## 2024-03-18 MED ORDER — COLCHICINE 0.6 MG PO TABS
ORAL_TABLET | ORAL | 3 refills | Status: DC
Start: 2024-03-18 — End: 2024-09-06

## 2024-03-18 MED ORDER — INSULIN NPH ISOPHANE & REGULAR (70-30) 100 UNIT/ML ~~LOC~~ SUSP: SUBCUTANEOUS | 3 refills | Status: DC

## 2024-03-18 MED ORDER — TRULICITY 3 MG/0.5ML ~~LOC~~ SOAJ
SUBCUTANEOUS | 3 refills | Status: AC
Start: 2024-03-18 — End: ?

## 2024-03-18 MED ORDER — ROSUVASTATIN CALCIUM 5 MG PO TABS
ORAL_TABLET | ORAL | 3 refills | Status: AC
Start: 2024-03-18 — End: ?

## 2024-03-18 MED ORDER — ATENOLOL 100 MG PO TABS: ORAL_TABLET | ORAL | 2 refills | Status: DC

## 2024-03-18 NOTE — Progress Notes (Signed)
 New Patient Office Visit  Subjective   Patient ID: Jocelyn Camacho, female    DOB: [birth year]  Age: 78 y.o. MRN: [id number]  CC:  Chief Complaint  Patient presents with   New Patient (Initial Visit)    HPI Jocelyn Camacho presents to establish care with new provider.   Patients previous primary care provider was [facility] with Dr. Lucky Cowboy. Last seen 12/31/2023.   Specialist: [facility] with Dr. Royal Piedra [facility] with Dr. Alan Mulder  [facility]-Dr. Ollen Gross  [facility]- December 2024   Gout: Chronic. Patient is prescribed Allopurinol 150mg  daily and Colchicine 0.6mg  daily for prevention. Not had recent flare up.   Hypertension: Chronic. Patient is prescribed Atenolol 100mg  daily, but she is taking only 1/2 tablet. She reports she has been taking this way for a while.  She reports she monitors her blood pressure at home, but currently her BP machine is not working. Denies CP, SHOB, HA, dizziness, lightheadedness, or lower extremity edema.  BP Readings from Last 3 Encounters:  03/18/24 134/80  12/31/23 138/80  11/11/23 116/74    Diabetes: Chronic. Patient is taking Trulicity 3mg  injection every 7 days and Insulin NPH-regular Human 70/30- take 25 units in the AM and 10 units in the PM. She has been monitoring her blood sugar at home, ranging 87-150 from what she can remember.  Lab Results  Component Value Date   HGBA1C 5.2 12/31/2023    Lower extremity edema: Patient is prescribed Furosemide 80mg  tablet, take 1 tablet 1-2 times a day as needed for fluid retention or ankle swelling. Patient reports she takes the medication every day and report "very rarely" she has to take two tablets.   Hypothyroidism: Chronic. Patient is taking Levothyroxine tablet, whole tablet everyday but Wednesday, she takes 1/2 tablet.  Lab Results  Component Value Date   TSH 0.47  12/31/2023    Hyperlipidemia: Chronic. Patient is prescribed Rosuvastatin 5mg  once a week. Effective.  Lab Results  Component Value Date   CHOL 100 12/31/2023   HDL 37 (L) 12/31/2023   LDLCALC 44 12/31/2023   TRIG 104 12/31/2023   CHOLHDL 2.7 12/31/2023     Outpatient Encounter Medications as of 03/18/2024  Medication Sig   acetaminophen (TYLENOL) 650 MG CR tablet Take 1,300 mg by mouth every 8 (eight) hours as needed for pain.   Ascorbic Acid (VITAMIN C) 1000 MG tablet Take 1,000 mg by mouth daily.   aspirin EC 81 MG tablet Take 81 mg by mouth daily.   Cholecalciferol (VITAMIN D3) 5000 units CAPS Take 5,000 Units by mouth daily.   ciprofloxacin (CILOXAN) 0.3 % ophthalmic solution    Cyanocobalamin (B-12) 1000 MCG SUBL Place 1,000 mcg under the tongue 2 (two) times a week. Saturday and Sunday   dorzolamide-timolol (COSOPT) 22.3-6.8 MG/ML ophthalmic solution Place 1 drop into the left eye 2 (two) times daily.   Finerenone (KERENDIA) 10 MG TABS Take 10 mg by mouth daily. Take  1 tablet  Daily  for Diabetic Kidneys (Patient taking differently: Take 10 mg by mouth daily. Diabetic Kidneys)   furosemide (LASIX) 80 MG tablet TAKE 1 TABLET BY MOUTH 1 TO 2 TIMES PER DAY AS NEEDED FOR FLUID RETENTION OR ANKLE SWELLING   hyoscyamine (LEVSIN, ANASPAZ) 0.125 MG tablet Take 1 tablet (0.125 mg total) by mouth every 6 (six) hours as needed.   Lancets (ONETOUCH ULTRASOFT) lancets Check  blood sugar 3 times daily-[id number]   levothyroxine (SYNTHROID) 125 MCG tablet TAKE 1 TABLET BY MOUTH DAILY ON AN EMPTY STOMACH WITH ONLY WATER FOR 30 MINUTES AND NO ANTACID MED, CALCIUM OR MAGNESIUM FOR 4 HOURS. AVOID BIOTIN   ONETOUCH ULTRA test strip CHECK BLOOD SUGAR 3 TIMES DAILY   polymyxin B 500,000 Units in sodium chloride irrigation 0.9 % 500 mL Irrigate with 1 Application as directed every 30 (thirty) days. Eye Injection   zinc gluconate 50 MG tablet Take 50 mg by mouth daily. 3 times weekly   [DISCONTINUED]  allopurinol (ZYLOPRIM) 300 MG tablet TAKE 1/2 TABLET(150 MG) BY MOUTH DAILY FOR GOUT PREVENTION   [DISCONTINUED] atenolol (TENORMIN) 100 MG tablet Take 1 tablet by mouth once daily for blood pressure   [DISCONTINUED] colchicine 0.6 MG tablet Take 1 tablet daily for prevention of Gout   [DISCONTINUED] Dulaglutide (TRULICITY) 3 MG/0.5ML SOPN INJECT 3 MG  SUBCUTANEOUSLY ONCE A WEEK FOR DIABETES   [DISCONTINUED] insulin NPH-regular Human (70-30) 100 UNIT/ML injection Patient injects 25 units in the AM and 10 units in the PM. Reduce each insulin dose by 2 units if glucose persistently <100   [DISCONTINUED] nystatin cream (MYCOSTATIN) Apply 1 Application topically 2 (two) times daily.   [DISCONTINUED] rosuvastatin (CRESTOR) 5 MG tablet Take 1 tablet once weekly as directed for Cholesterol LDL goal less than 70.   [DISCONTINUED] traMADol (ULTRAM) 50 MG tablet Take 1 tablet (50 mg total) by mouth every 6 (six) hours as needed for moderate pain.   allopurinol (ZYLOPRIM) 300 MG tablet TAKE 1/2 TABLET(150 MG) BY MOUTH DAILY FOR GOUT PREVENTION   atenolol (TENORMIN) 100 MG tablet Take 1 tablet by mouth once daily for blood pressure   colchicine 0.6 MG tablet Take 1 tablet daily for prevention of Gout   Dulaglutide (TRULICITY) 3 MG/0.5ML SOAJ INJECT 3 MG  SUBCUTANEOUSLY ONCE A WEEK FOR DIABETES   insulin NPH-regular Human (70-30) 100 UNIT/ML injection Patient injects 25 units in the AM and 10 units in the PM. Reduce each insulin dose by 2 units if glucose persistently <100   rosuvastatin (CRESTOR) 5 MG tablet Take 1 tablet once weekly as directed for Cholesterol LDL goal less than 70.   No facility-administered encounter medications on file as of 03/18/2024.    Past Medical History:  Diagnosis Date   Allergic rhinitis    Allergy    Anxiety    with medical procedures   Gout    Hyperlipidemia    Hypertension    Obesity    OSA (obstructive sleep apnea)    wears CPAP   Type II or unspecified type  diabetes mellitus with renal manifestations, not stated as uncontrolled(250.40)    Unspecified hypothyroidism    Vitamin D deficiency    Vitiligo     Past Surgical History:  Procedure Laterality Date    eye procedure     have treatment where a needle is stuck into left eye- since June 2014; now every six weeks   BOWEL RESECTION N/A 07/24/2022   Procedure: SMALL BOWEL RESECTION;  Surgeon: Axel Filler, MD;  Location: [facility] OR;  Service: General;  Laterality: N/A;   DENTAL EXAMINATION UNDER ANESTHESIA     ENDOVENOUS ABLATION SAPHENOUS VEIN W/ LASER Left 12/31/2022   endovenous laser ablation left SSV by Cari Caraway MD   LAPAROSCOPY N/A 07/24/2022   Procedure: LAPAROSCOPY DIAGNOSTIC;  Surgeon: Axel Filler, MD;  Location: [facility] OR;  Service: General;  Laterality: N/A;   LAPAROTOMY N/A 07/24/2022  Procedure: EXPLORATORY LAPAROTOMY;  Surgeon: Axel Filler, MD;  Location: [facility] OR;  Service: General;  Laterality: N/A;   TUBAL LIGATION     UMBILICAL HERNIA REPAIR N/A 07/24/2022   Procedure: PRIMARY UMBILICAL HERNIA REPAIR;  Surgeon: Axel Filler, MD;  Location: [facility] OR;  Service: General;  Laterality: N/A;    Family History  Problem Relation Age of Onset   Breast cancer Mother    Renal Disease Mother    Hypertension Sister    Diabetes Sister    Diabetes Maternal Grandmother    Colon cancer Neg Hx    Esophageal cancer Neg Hx    Stomach cancer Neg Hx    Rectal cancer Neg Hx     Social History   Socioeconomic History   Marital status: Married    Spouse name: Charles   Number of children: 1   Years of education: Not on file   Highest education level: Some college, no degree  Occupational History   Occupation: Retired  Tobacco Use   Smoking status: Never   Smokeless tobacco: Never  Vaping Use   Vaping status: Never Used  Substance and Sexual Activity   Alcohol use: Not Currently    Comment: very rarely   Drug use: No   Sexual activity: Not Currently    Partners:  Male    Birth control/protection: Post-menopausal  Other Topics Concern   Not on file  Social History Narrative   Lives with husband    Right handed   Caffeine: 1 cup of coffee every 2 weeks   Social Drivers of Corporate investment banker Strain: Low Risk  (03/18/2024)   Overall Financial Resource Strain (CARDIA)    Difficulty of Paying Living Expenses: Not hard at all  Food Insecurity: No Food Insecurity (03/18/2024)   Hunger Vital Sign    Worried About Running Out of Food in the Last Year: Never true    Ran Out of Food in the Last Year: Never true  Transportation Needs: No Transportation Needs (03/18/2024)   PRAPARE - Administrator, Civil Service (Medical): No    Lack of Transportation (Non-Medical): No  Physical Activity: Insufficiently Active (03/18/2024)   Exercise Vital Sign    Days of Exercise per Week: 1 day    Minutes of Exercise per Session: 10 min  Stress: No Stress Concern Present (03/18/2024)   [employer] of Occupational Health - Occupational Stress Questionnaire    Feeling of Stress : Only a little  Social Connections: Socially Integrated (03/18/2024)   Social Connection and Isolation Panel [NHANES]    Frequency of Communication with Friends and Family: More than three times a week    Frequency of Social Gatherings with Friends and Family: Once a week    Attends Religious Services: 1 to 4 times per year    Active Member of [employer] or Organizations: Yes    Attends Banker Meetings: 1 to 4 times per year    Marital Status: Married  Catering manager Violence: Not At Risk (03/18/2024)   Humiliation, Afraid, Rape, and Kick questionnaire    Fear of Current or Ex-Partner: No    Emotionally Abused: No    Physically Abused: No    Sexually Abused: No    ROS See HPI above    Objective  BP 134/80   Pulse 83   Temp 97.7 F (36.5 C) (Oral)   Ht 5\' 3"  (1.6 m)   Wt 229 lb (103.9 kg)   SpO2 96%  BMI 40.57 kg/m   Physical Exam Vitals  reviewed.  Constitutional:      General: She is not in acute distress.    Appearance: Normal appearance. She is not ill-appearing, toxic-appearing or diaphoretic.  Eyes:     General:        Right eye: No discharge.        Left eye: No discharge.     Conjunctiva/sclera: Conjunctivae normal.  Cardiovascular:     Rate and Rhythm: Normal rate and regular rhythm.     Heart sounds: Normal heart sounds. No murmur heard.    No friction rub. No gallop.  Pulmonary:     Effort: Pulmonary effort is normal. No respiratory distress.     Breath sounds: Normal breath sounds.  Musculoskeletal:        General: Normal range of motion.     Right lower leg: Edema present.     Left lower leg: Edema present.     Comments: Wearing compression stockings   Skin:    General: Skin is warm and dry.  Neurological:     General: No focal deficit present.     Mental Status: She is alert and oriented to person, place, and time. Mental status is at baseline.     Gait: Gait abnormal Randie Heinz).  Psychiatric:        Mood and Affect: Mood normal.        Behavior: Behavior normal.        Thought Content: Thought content normal.        Judgment: Judgment normal.      Assessment & Plan:  Essential hypertension Assessment & Plan: Blood pressure is stable Continue with Atenolol daily. She is taking 50mg , instead of 100mg  as prescribed. Refilled medication.   Orders: -     Atenolol; Take 1 tablet by mouth once daily for blood pressure  Dispense: 90 tablet; Refill: 2  Idiopathic gout, unspecified chronicity, unspecified site Assessment & Plan: Stable. Continue with Allopurinol 150mg  daily and Colchicine 0.6mg  daily for prevention of a flare up. Denies recent flare up. Refilled both medications.   Orders: -     Allopurinol; TAKE 1/2 TABLET(150 MG) BY MOUTH DAILY FOR GOUT PREVENTION  Dispense: 45 tablet; Refill: 2  Chronic gout without tophus, unspecified cause, unspecified site Assessment & Plan: Stable. Continue  with Allopurinol 150mg  daily and Colchicine 0.6mg  daily for prevention of a flare up. Denies recent flare up. Refilled both medications.   Orders: -     Colchicine; Take 1 tablet daily for prevention of Gout  Dispense: 90 tablet; Refill: 3  Type 2 diabetes mellitus with stage 3b chronic kidney disease, with long-term current use of insulin (HCC) Assessment & Plan: Stable A1c back in January. Continue taking Trulicity 3mg  injection and Insuline NPH-regular Human 70/30, 25 U in the AM and 10 U in the PM. Refilled medications. Patient reports she had her eye exam within the last year with [facility]. Will need a foot exam at next appointment. She is on a statin, but no an ACE or ARB. Patient is on [place] for kidney protection with nephrologist.   Orders: -     Trulicity; INJECT 3 MG  SUBCUTANEOUSLY ONCE A WEEK FOR DIABETES  Dispense: 12 mL; Refill: 3 -     Insulin NPH Isophane & Regular; Patient injects 25 units in the AM and 10 units in the PM. Reduce each insulin dose by 2 units if glucose persistently <100  Dispense: 10 mL; Refill: 3  Hyperlipidemia associated with type 2 diabetes mellitus (HCC) Assessment & Plan: Stable. Continue Rosuvastatin 5mg  tablet once a week. Patient reports she has been taking medication this way and levels have been stable. Refilled medication.   Orders: -     Rosuvastatin Calcium; Take 1 tablet once weekly as directed for Cholesterol LDL goal less than 70.  Dispense: 13 tablet; Refill: 3  Venous stasis of both lower extremities Assessment & Plan: Stable. Continue Furosemide 80mg , 1-2 times a day. Continue wearing compression stockings.    Hypothyroidism, unspecified type Assessment & Plan: Stable. Last TSH was normal in January. Continue Levothyroxine daily, except Wednesday, 1/2 tablet.   Encounter to establish care   1.Review health maintanance:  -Covid booster: Will get updated vaccine -Influenza vaccine: Declines  -AWV-will schedule  when leaving  -Zoster vaccine: Declines  2. Will obtain labs at next visit. Chronic management labs were completed back in January and not due.  Return in about 3 months (around 06/18/2024) for chronic management; AWV-telehealth .   Zandra Abts, NP

## 2024-03-18 NOTE — Patient Instructions (Signed)
-  It was nice to meet you and look forward to taking caring care of you.  -Refilled several medications.  -Continue taking prescribed medications.  -Follow up in 3 months for chronic management and telehealth visit for Annual Wellness Visit.

## 2024-03-21 ENCOUNTER — Telehealth: Payer: Self-pay

## 2024-03-21 NOTE — Assessment & Plan Note (Addendum)
 Stable A1c back in January. Continue taking Trulicity 3mg  injection and Insuline NPH-regular Human 70/30, 25 U in the AM and 10 U in the PM. Refilled medications. Patient reports she had her eye exam within the last year with [facility]. Will need a foot exam at next appointment. She is on a statin, but no an ACE or ARB. Patient is on [place] for kidney protection with nephrologist.

## 2024-03-21 NOTE — Assessment & Plan Note (Addendum)
 Blood pressure is stable Continue with Atenolol daily. She is taking 50mg , instead of 100mg  as prescribed. Refilled medication.

## 2024-03-21 NOTE — Assessment & Plan Note (Signed)
 Stable. Continue Rosuvastatin 5mg  tablet once a week. Patient reports she has been taking medication this way and levels have been stable. Refilled medication.

## 2024-03-21 NOTE — Telephone Encounter (Signed)
 Copied from CRM [id number]. Topic: Clinical - Medication Question >> Mar 21, 2024  3:12 PM [name] J wrote: Reason for CRM: Pt wants to know if it's okay for her to use the novolin? She states she purchased the incorrect insulin but doesn't want them to go to waste. Please advise

## 2024-03-21 NOTE — Assessment & Plan Note (Signed)
 Stable. Continue Furosemide 80mg , 1-2 times a day. Continue wearing compression stockings.

## 2024-03-21 NOTE — Assessment & Plan Note (Signed)
 Stable. Last TSH was normal in January. Continue Levothyroxine daily, except Wednesday, 1/2 tablet.

## 2024-03-21 NOTE — Assessment & Plan Note (Addendum)
 Stable. Continue with Allopurinol 150mg  daily and Colchicine 0.6mg  daily for prevention of a flare up. Denies recent flare up. Refilled both medications.

## 2024-03-28 ENCOUNTER — Encounter (INDEPENDENT_AMBULATORY_CARE_PROVIDER_SITE_OTHER): Admitting: Ophthalmology

## 2024-03-28 DIAGNOSIS — H43813 Vitreous degeneration, bilateral: Secondary | ICD-10-CM | POA: Diagnosis not present

## 2024-03-28 DIAGNOSIS — H35033 Hypertensive retinopathy, bilateral: Secondary | ICD-10-CM | POA: Diagnosis not present

## 2024-03-28 DIAGNOSIS — H34812 Central retinal vein occlusion, left eye, with macular edema: Secondary | ICD-10-CM

## 2024-03-28 DIAGNOSIS — I10 Essential (primary) hypertension: Secondary | ICD-10-CM

## 2024-03-31 ENCOUNTER — Ambulatory Visit: Payer: Medicare Other | Admitting: Nurse Practitioner

## 2024-04-05 ENCOUNTER — Telehealth: Payer: Self-pay | Admitting: Family Medicine

## 2024-04-05 NOTE — Telephone Encounter (Unsigned)
 Copied from CRM [id number]. Topic: Clinical - Medical Advice >> Apr 05, 2024  3:45 PM Jocelyn Camacho wrote: Reason for CRM: Patient is calling in because she wants to know if she gets the current COVID vaccine would she need to get the vaccines that came out prior. Please advise.

## 2024-05-02 ENCOUNTER — Encounter (INDEPENDENT_AMBULATORY_CARE_PROVIDER_SITE_OTHER): Admitting: Ophthalmology

## 2024-05-02 DIAGNOSIS — H34812 Central retinal vein occlusion, left eye, with macular edema: Secondary | ICD-10-CM

## 2024-05-02 DIAGNOSIS — I10 Essential (primary) hypertension: Secondary | ICD-10-CM

## 2024-05-02 DIAGNOSIS — H43813 Vitreous degeneration, bilateral: Secondary | ICD-10-CM | POA: Diagnosis not present

## 2024-05-02 DIAGNOSIS — H35033 Hypertensive retinopathy, bilateral: Secondary | ICD-10-CM | POA: Diagnosis not present

## 2024-06-06 ENCOUNTER — Encounter (INDEPENDENT_AMBULATORY_CARE_PROVIDER_SITE_OTHER): Admitting: Ophthalmology

## 2024-06-06 DIAGNOSIS — I10 Essential (primary) hypertension: Secondary | ICD-10-CM

## 2024-06-06 DIAGNOSIS — H34812 Central retinal vein occlusion, left eye, with macular edema: Secondary | ICD-10-CM

## 2024-06-06 DIAGNOSIS — H43813 Vitreous degeneration, bilateral: Secondary | ICD-10-CM

## 2024-06-06 DIAGNOSIS — H35033 Hypertensive retinopathy, bilateral: Secondary | ICD-10-CM | POA: Diagnosis not present

## 2024-06-17 ENCOUNTER — Encounter: Payer: Self-pay | Admitting: Family Medicine

## 2024-06-17 ENCOUNTER — Ambulatory Visit (INDEPENDENT_AMBULATORY_CARE_PROVIDER_SITE_OTHER): Admitting: Family Medicine

## 2024-06-17 VITALS — BP 134/82 | HR 80 | Temp 97.5°F | Ht 63.0 in | Wt 232.0 lb

## 2024-06-17 DIAGNOSIS — E039 Hypothyroidism, unspecified: Secondary | ICD-10-CM

## 2024-06-17 DIAGNOSIS — N182 Chronic kidney disease, stage 2 (mild): Secondary | ICD-10-CM | POA: Diagnosis not present

## 2024-06-17 DIAGNOSIS — E1169 Type 2 diabetes mellitus with other specified complication: Secondary | ICD-10-CM | POA: Diagnosis not present

## 2024-06-17 DIAGNOSIS — M1A9XX0 Chronic gout, unspecified, without tophus (tophi): Secondary | ICD-10-CM

## 2024-06-17 DIAGNOSIS — R140 Abdominal distension (gaseous): Secondary | ICD-10-CM

## 2024-06-17 DIAGNOSIS — K439 Ventral hernia without obstruction or gangrene: Secondary | ICD-10-CM

## 2024-06-17 DIAGNOSIS — M25472 Effusion, left ankle: Secondary | ICD-10-CM

## 2024-06-17 DIAGNOSIS — Z794 Long term (current) use of insulin: Secondary | ICD-10-CM | POA: Diagnosis not present

## 2024-06-17 DIAGNOSIS — M25471 Effusion, right ankle: Secondary | ICD-10-CM

## 2024-06-17 DIAGNOSIS — E785 Hyperlipidemia, unspecified: Secondary | ICD-10-CM

## 2024-06-17 DIAGNOSIS — E1122 Type 2 diabetes mellitus with diabetic chronic kidney disease: Secondary | ICD-10-CM | POA: Diagnosis not present

## 2024-06-17 DIAGNOSIS — I10 Essential (primary) hypertension: Secondary | ICD-10-CM | POA: Diagnosis not present

## 2024-06-17 DIAGNOSIS — N183 Chronic kidney disease, stage 3 unspecified: Secondary | ICD-10-CM

## 2024-06-17 LAB — COMPREHENSIVE METABOLIC PANEL WITH GFR
ALT: 17 U/L (ref 0–35)
AST: 20 U/L (ref 0–37)
Albumin: 4.3 g/dL (ref 3.5–5.2)
Alkaline Phosphatase: 119 U/L — ABNORMAL HIGH (ref 39–117)
BUN: 28 mg/dL — ABNORMAL HIGH (ref 6–23)
CO2: 29 meq/L (ref 19–32)
Calcium: 9.6 mg/dL (ref 8.4–10.5)
Chloride: 100 meq/L (ref 96–112)
Creatinine, Ser: 1.17 mg/dL (ref 0.40–1.20)
GFR: 44.78 mL/min — ABNORMAL LOW (ref >60.00)
Glucose, Bld: 96 mg/dL (ref 70–99)
Potassium: 3.5 meq/L (ref 3.5–5.1)
Sodium: 138 meq/L (ref 135–145)
Total Bilirubin: 0.8 mg/dL (ref 0.2–1.2)
Total Protein: 7.7 g/dL (ref 6.0–8.3)

## 2024-06-17 LAB — CBC WITH DIFFERENTIAL/PLATELET
Basophils Absolute: 0.1 10*3/uL (ref 0.0–0.1)
Basophils Relative: 0.6 % (ref 0.0–3.0)
Eosinophils Absolute: 0.1 10*3/uL (ref 0.0–0.7)
Eosinophils Relative: 1.3 % (ref 0.0–5.0)
HCT: 36.2 % (ref 36.0–46.0)
Hemoglobin: 11.9 g/dL — ABNORMAL LOW (ref 12.0–15.0)
Lymphocytes Relative: 33.4 % (ref 12.0–46.0)
Lymphs Abs: 3.3 10*3/uL (ref 0.7–4.0)
MCHC: 32.9 g/dL (ref 30.0–36.0)
MCV: 85.7 fl (ref 78.0–100.0)
Monocytes Absolute: 0.7 10*3/uL (ref 0.1–1.0)
Monocytes Relative: 7 % (ref 3.0–12.0)
Neutro Abs: 5.6 10*3/uL (ref 1.4–7.7)
Neutrophils Relative %: 57.7 % (ref 43.0–77.0)
Platelets: 338 10*3/uL (ref 150.0–400.0)
RBC: 4.22 Mil/uL (ref 3.87–5.11)
RDW: 16 % — ABNORMAL HIGH (ref 11.5–15.5)
WBC: 9.8 10*3/uL (ref 4.0–10.5)

## 2024-06-17 LAB — TSH: TSH: 0.46 u[IU]/mL (ref 0.35–5.50)

## 2024-06-17 LAB — LIPID PANEL
Cholesterol: 116 mg/dL (ref 0–200)
HDL: 35.1 mg/dL — ABNORMAL LOW (ref >39.00)
LDL Cholesterol: 57 mg/dL (ref 0–99)
NonHDL: 81.39
Total CHOL/HDL Ratio: 3
Triglycerides: 124 mg/dL (ref 0.0–149.0)
VLDL: 24.8 mg/dL (ref 0.0–40.0)

## 2024-06-17 LAB — HEMOGLOBIN A1C: Hgb A1c MFr Bld: 5.5 % (ref 4.6–6.5)

## 2024-06-17 MED ORDER — ONETOUCH ULTRA VI STRP: ORAL_STRIP | 3 refills | Status: DC

## 2024-06-17 NOTE — Assessment & Plan Note (Signed)
 Stable A1c back in January. Continue taking Trulicity  3mg  injection and Insuline NPH-regular Human 70/30, 25 U in the AM and 10 U in the PM. Ophthalmology eye exam UTD. Will need a foot exam at next appointment. She is on a statin, but no an ACE or ARB. Patient is on [place]  for kidney protection with nephrologist.

## 2024-06-17 NOTE — Assessment & Plan Note (Signed)
 Stable. Continue Rosuvastatin  5mg  tablet once a week. Ordered CMP and lipid panel.

## 2024-06-17 NOTE — Progress Notes (Signed)
 Established Patient Office Visit   Subjective:  Patient ID: Jocelyn Camacho, female    DOB: May 22, 1946  Age: 78 y.o. MRN: [id number]  Chief Complaint  Patient presents with   Medical Management of Chronic Issues    3 month follow up     HPI Gout: Chronic. Patient is prescribed Allopurinol  150mg  daily and Colchicine  0.6mg  daily for prevention. Not had recent flare up.    Hypertension: Chronic. Patient is prescribed Atenolol  100mg  daily, but she is taking only 1/2 tablet. She reports she has been taking this way for a while.  She reports she usually does not monitor her blood pressure at home.  Denies CP, SHOB, HA, dizziness, lightheadedness, or abnormal lower extremity edema.  BP Readings from Last 3 Encounters:  06/17/24 134/82  03/18/24 134/80  12/31/23 138/80     Diabetes: Chronic. Patient is taking Trulicity  3mg  injection every 7 days and Insulin  NPH-regular Human 70/30- take 25 units in the AM and 10 units in the PM. She has been monitoring her blood sugar at home, ranging 87-210 from what she can remember. Also, taking Finerenone  10mg  daily for kidney function.  Lab Results  Component Value Date   HGBA1C 5.5 06/17/2024    Lower extremity edema: Patient is prescribed Furosemide  80mg  tablet, take 1 tablet 1-2 times a day as needed for fluid retention or ankle swelling. Patient reports she takes the medication every day and report very rarely she has to take two tablets.    Hypothyroidism: Chronic. Patient is taking Levothyroxine  125mcg tablet, whole tablet everyday but Wednesday, she takes 1/2 tablet.  Recent Labs       Lab Results  Component Value Date    TSH 0.47 12/31/2023      Hyperlipidemia: Chronic. Patient is prescribed Rosuvastatin  5mg  once a week. Effective.  Lab Results  Component Value Date   CHOL 116 06/17/2024   HDL 35.10 (L) 06/17/2024   LDLCALC 57 06/17/2024   TRIG 124.0 06/17/2024   CHOLHDL 3 06/17/2024    Patient reports she has been having bloating  and enlargement in abd. Denies abd pain, nausea, vomiting, fever, diarrhea, or constipation. She reports this has been occurring at least 6 months or more. She reports when she mentioned this to her previous primary care provider. She was told to take Gas-X. She reports she has not been taking this due to not feeling like it is gas. The enlargement of her abd and bloating is always there, not depending on what she eats. Back in 07/2022, she had a small bowel resection with a hernia repair.  ROS See HPI above     Objective:   BP 134/82   Pulse 80   Temp (!) 97.5 F (36.4 C) (Oral)   Ht 5' 3 (1.6 m)   Wt 232 lb (105.2 kg)   SpO2 98%   BMI 41.10 kg/m    Physical Exam Vitals reviewed.  Constitutional:      General: She is not in acute distress.    Appearance: Normal appearance. She is not ill-appearing, toxic-appearing or diaphoretic.   Eyes:     General:        Right eye: No discharge.        Left eye: No discharge.     Conjunctiva/sclera: Conjunctivae normal.    Cardiovascular:     Rate and Rhythm: Normal rate and regular rhythm.     Heart sounds: Normal heart sounds. No murmur heard.    No friction rub. No gallop.  Pulmonary:     Effort: Pulmonary effort is normal. No respiratory distress.     Breath sounds: Normal breath sounds.  Abdominal:     General: There is distension.     Palpations: Abdomen is soft. There is no mass.     Tenderness: There is no abdominal tenderness. There is no guarding or rebound.     Hernia: A hernia (Possible large hernia) is present.   Musculoskeletal:        General: Normal range of motion.   Skin:    General: Skin is warm and dry.   Neurological:     General: No focal deficit present.     Mental Status: She is alert and oriented to person, place, and time. Mental status is at baseline.   Psychiatric:        Mood and Affect: Mood normal.        Behavior: Behavior normal.        Thought Content: Thought content normal.         Judgment: Judgment normal.     Results for orders placed or performed in visit on 06/17/24  CBC with Differential/Platelet  Result Value Ref Range   WBC 9.8 4.0 - 10.5 K/uL   RBC 4.22 3.87 - 5.11 Mil/uL   Hemoglobin 11.9 (L) 12.0 - 15.0 g/dL   HCT 63.7 63.9 - 53.9 %   MCV 85.7 78.0 - 100.0 fl   MCHC 32.9 30.0 - 36.0 g/dL   RDW 83.9 (H) 88.4 - 84.4 %   Platelets 338.0 150.0 - 400.0 K/uL   Neutrophils Relative % 57.7 43.0 - 77.0 %   Lymphocytes Relative 33.4 12.0 - 46.0 %   Monocytes Relative 7.0 3.0 - 12.0 %   Eosinophils Relative 1.3 0.0 - 5.0 %   Basophils Relative 0.6 0.0 - 3.0 %   Neutro Abs 5.6 1.4 - 7.7 K/uL   Lymphs Abs 3.3 0.7 - 4.0 K/uL   Monocytes Absolute 0.7 0.1 - 1.0 K/uL   Eosinophils Absolute 0.1 0.0 - 0.7 K/uL   Basophils Absolute 0.1 0.0 - 0.1 K/uL  Comprehensive metabolic panel with GFR  Result Value Ref Range   Sodium 138 135 - 145 mEq/L   Potassium 3.5 3.5 - 5.1 mEq/L   Chloride 100 96 - 112 mEq/L   CO2 29 19 - 32 mEq/L   Glucose, Bld 96 70 - 99 mg/dL   BUN 28 (H) 6 - 23 mg/dL   Creatinine, Ser 8.82 0.40 - 1.20 mg/dL   Total Bilirubin 0.8 0.2 - 1.2 mg/dL   Alkaline Phosphatase 119 (H) 39 - 117 U/L   AST 20 0 - 37 U/L   ALT 17 0 - 35 U/L   Total Protein 7.7 6.0 - 8.3 g/dL   Albumin  4.3 3.5 - 5.2 g/dL   GFR 55.21 (L) >39.99 mL/min   Calcium  9.6 8.4 - 10.5 mg/dL  Lipid panel  Result Value Ref Range   Cholesterol 116 0 - 200 mg/dL   Triglycerides 875.9 0.0 - 149.0 mg/dL   HDL 64.89 (L) >60.99 mg/dL   VLDL 75.1 0.0 - 59.9 mg/dL   LDL Cholesterol 57 0 - 99 mg/dL   Total CHOL/HDL Ratio 3    NonHDL 81.39   TSH  Result Value Ref Range   TSH 0.46 0.35 - 5.50 uIU/mL  Hemoglobin A1c  Result Value Ref Range   Hgb A1c MFr Bld 5.5 4.6 - 6.5 %    The ASCVD Risk score (Arnett DK,  et al., 2019) failed to calculate for the following reasons:   The valid total cholesterol range is 130 to 320 mg/dL    Assessment & Plan:  Essential hypertension Assessment  & Plan: Stable. Continue with Atenolol  100 tablet, 1/2 tablet daily. Ordered CBC and CMP.   Orders: -     CBC with Differential/Platelet -     Comprehensive metabolic panel with GFR  Type 2 diabetes mellitus with stage 2 chronic kidney disease, with long-term current use of insulin  (HCC) Assessment & Plan: Stable A1c back in January. Continue taking Trulicity  3mg  injection and Insuline NPH-regular Human 70/30, 25 U in the AM and 10 U in the PM. Ophthalmology eye exam UTD. Will need a foot exam at next appointment. She is on a statin, but no an ACE or ARB. Patient is on [place]  for kidney protection with nephrologist.     Orders: -     OneTouch Ultra; Use as instructed  Dispense: 300 strip; Refill: 3 -     Comprehensive metabolic panel with GFR -     Hemoglobin A1c  Hypothyroidism, unspecified type Assessment & Plan: Stable. Last TSH was normal in January. Continue Levothyroxine  125mcg daily, except Wednesday, 1/2 tablet. Ordered TSH.     Orders: -     TSH  Hyperlipidemia associated with type 2 diabetes mellitus (HCC) Assessment & Plan: Stable. Continue Rosuvastatin  5mg  tablet once a week. Ordered CMP and lipid panel.   Orders: -     Comprehensive metabolic panel with GFR -     Lipid panel  CKD stage 3 due to type 2 diabetes mellitus (HCC) Assessment & Plan: Ordered CMP. Continue taking Finerenone  10mg  daily.   Orders: -     Comprehensive metabolic panel with GFR  Chronic gout without tophus, unspecified cause, unspecified site Assessment & Plan: Stable. Continue with Allopurinol  150mg  daily and Colchicine  0.6mg  daily for prevention of a flare up. Denies recent flare up.    Ankle edema, bilateral Assessment & Plan: Stable. Continue Furosemide  80mg  tablet, take 1 tablet 1-2 times PRN.    Abdominal bloating -     CT ABDOMEN PELVIS WO CONTRAST; Future  Hernia of abdominal wall -     CT ABDOMEN PELVIS WO CONTRAST; Future   -Ordered CT abd scan for abd bloating and  possible hernia.  Return in about 3 months (around 09/17/2024) for physical- .   Zakariah Dejarnette, NP

## 2024-06-17 NOTE — Patient Instructions (Signed)
-  It was nice to see you today.  -Continue all medications.  -Ordered labs. Office will call with lab results and will be available on MyChart. -Ordered CT scan of abd. Please call the office or send a MyChart message if you do not receive a phone call about appointment in 1 week.  -Follow up in 3 months for a physical.

## 2024-06-17 NOTE — Assessment & Plan Note (Signed)
 Stable. Continue with Atenolol  100 tablet, 1/2 tablet daily. Ordered CBC and CMP.

## 2024-06-17 NOTE — Assessment & Plan Note (Signed)
 Ordered CMP. Continue taking Finerenone  10mg  daily.

## 2024-06-17 NOTE — Assessment & Plan Note (Signed)
 Stable. Continue Furosemide  80mg  tablet, take 1 tablet 1-2 times PRN.

## 2024-06-17 NOTE — Assessment & Plan Note (Signed)
 Stable. Last TSH was normal in January. Continue Levothyroxine  125mcg daily, except Wednesday, 1/2 tablet. Ordered TSH.

## 2024-06-17 NOTE — Assessment & Plan Note (Signed)
 Stable. Continue with Allopurinol  150mg  daily and Colchicine  0.6mg  daily for prevention of a flare up. Denies recent flare up.

## 2024-06-20 ENCOUNTER — Other Ambulatory Visit: Payer: Self-pay

## 2024-06-20 DIAGNOSIS — N182 Chronic kidney disease, stage 2 (mild): Secondary | ICD-10-CM

## 2024-06-20 MED ORDER — ONETOUCH ULTRA VI STRP
1.0000 | ORAL_STRIP | Freq: Three times a day (TID) | 3 refills | Status: AC
Start: 2024-06-20 — End: ?

## 2024-06-21 ENCOUNTER — Ambulatory Visit: Payer: Self-pay | Admitting: Family Medicine

## 2024-06-21 DIAGNOSIS — K458 Other specified abdominal hernia without obstruction or gangrene: Secondary | ICD-10-CM

## 2024-06-21 DIAGNOSIS — D649 Anemia, unspecified: Secondary | ICD-10-CM

## 2024-07-06 ENCOUNTER — Ambulatory Visit (HOSPITAL_BASED_OUTPATIENT_CLINIC_OR_DEPARTMENT_OTHER)
Admission: RE | Admit: 2024-07-06 | Discharge: 2024-07-06 | Disposition: A | Discharge status: Home / Self Care | Source: Ambulatory Visit | Attending: Family Medicine | Admitting: Family Medicine

## 2024-07-06 DIAGNOSIS — R140 Abdominal distension (gaseous): Secondary | ICD-10-CM | POA: Insufficient documentation

## 2024-07-06 DIAGNOSIS — K439 Ventral hernia without obstruction or gangrene: Secondary | ICD-10-CM | POA: Insufficient documentation

## 2024-07-07 ENCOUNTER — Ambulatory Visit: Payer: Medicare Other | Admitting: Internal Medicine

## 2024-07-11 ENCOUNTER — Ambulatory Visit: Payer: Medicare Other | Admitting: Internal Medicine

## 2024-07-11 ENCOUNTER — Encounter (INDEPENDENT_AMBULATORY_CARE_PROVIDER_SITE_OTHER): Admitting: Ophthalmology

## 2024-07-11 ENCOUNTER — Other Ambulatory Visit: Payer: Self-pay

## 2024-07-11 DIAGNOSIS — H43813 Vitreous degeneration, bilateral: Secondary | ICD-10-CM | POA: Diagnosis not present

## 2024-07-11 DIAGNOSIS — I10 Essential (primary) hypertension: Secondary | ICD-10-CM | POA: Diagnosis not present

## 2024-07-11 DIAGNOSIS — K458 Other specified abdominal hernia without obstruction or gangrene: Secondary | ICD-10-CM

## 2024-07-11 DIAGNOSIS — H35033 Hypertensive retinopathy, bilateral: Secondary | ICD-10-CM

## 2024-07-11 DIAGNOSIS — H34812 Central retinal vein occlusion, left eye, with macular edema: Secondary | ICD-10-CM | POA: Diagnosis not present

## 2024-07-11 NOTE — Addendum Note (Signed)
 Addended by: ELNER NANNY B on: 07/11/2024 02:01 PM   Modules accepted: Orders

## 2024-07-12 ENCOUNTER — Ambulatory Visit (INDEPENDENT_AMBULATORY_CARE_PROVIDER_SITE_OTHER): Admitting: Family Medicine

## 2024-07-12 DIAGNOSIS — Z0000 Encounter for general adult medical examination without abnormal findings: Secondary | ICD-10-CM | POA: Diagnosis not present

## 2024-07-12 NOTE — Progress Notes (Signed)
 PATIENT CHECK-IN and HEALTH RISK ASSESSMENT QUESTIONNAIRE:  -completed by phone/video for upcoming Medicare Preventive Visit  Pre-Visit Check-in: 1)Vitals (height, wt, BP, etc) - record in vitals section for visit on day of visit Request home vitals (wt, BP, etc.) and enter into vitals, THEN update Vital Signs SmartPhrase below at the top of the HPI. See below.  2)Review and Update Medications, Allergies PMH, Surgeries, Social history in Epic 3)Hospitalizations in the last year with date/reason? n  4)Review and Update Care Team (patient's specialists) in Epic 5) Complete PHQ9 in Epic  6) Complete Fall Screening in Epic 7)Review all Health Maintenance Due and order if not done.  Medicare Wellness Patient Questionnaire:  Answer theses question about your habits: How often do you have a drink containing alcohol?n How many drinks containing alcohol do you have on a typical day when you are drinking?n How often do you have six or more drinks on one occasion?n Have you ever smoked?n Quit date if applicable? na  How many packs a day do/did you smoke? na Do you use smokeless tobacco?n Do you use an illicit drugs?n On average, how many days per week do you engage in moderate to strenuous exercise (like a brisk walk)?n On average, how many minutes do you engage in exercise at this level?na Are you sexually active?husband is sick Typical breakfast/lunch: combines breakfast and lunch, usually a sandwich with fruit Typical dinner: tries to do a protein and several veggies Typical snacks:graham crackers  Beverages: lemonade - sugar free  Answer theses question about your everyday activities: Can you perform most household chores? Yes, but uses a cane and daughter helps Are you deaf or have significant trouble hearing?n Do you feel that you have a problem with memory?n Do you feel safe at home?y Last dentist visit? Goes on a regular basis 8. Do you have any difficulty performing your everyday  activities?n Are you having any difficulty walking, taking medications on your own, and or difficulty managing daily home needs?n Do you have difficulty walking or climbing stairs?n Do you have difficulty dressing or bathing?n Do you have difficulty doing errands alone such as visiting a doctor's office or shopping?n Do you currently have any difficulty preparing food and eating?n Do you currently have any difficulty using the toilet?n Do you have any difficulty managing your finances?n Do you have any difficulties with housekeeping of managing your housekeeping?n   Do you have Advanced Directives in place (Living Will, Healthcare Power or Attorney)? no   Last eye Exam and location? Goes to Dr. Will every 4 weeks   Do you currently use prescribed or non-prescribed narcotic or opioid pain medications?n  Do you have a history or close family history of breast, ovarian, tubal or peritoneal cancer or a family member with BRCA (breast cancer susceptibility 1 and 2) gene mutations? Mother had breast cancer     ----------------------------------------------------------------------------------------------------------------------------------------------------------------------------------------------------------------------  Because this visit was a virtual/telehealth visit, some criteria may be missing or patient reported. Any vitals not documented were not able to be obtained and vitals that have been documented are patient reported.    MEDICARE ANNUAL PREVENTIVE VISIT WITH PROVIDER: (Welcome to Medicare, initial annual wellness or annual wellness exam)  Virtual Visit via Phone Note  I connected with Jocelyn Camacho on 07/12/24 by phone and verified that I am speaking with the correct person using two identifiers.She prefers to talk by phone.   Location patient: home Location provider:work or home office Persons participating in the virtual visit: patient, provider  Concerns and/or  follow up today: nothing new.   See HM section in Epic for other details of completed HM.    ROS: negative for report of fevers, unintentional weight loss, vision changes, vision loss, hearing loss or change, chest pain, sob, hemoptysis, melena, hematochezia, hematuria, falls, bleeding or bruising, thoughts of suicide or self harm, memory loss  Patient-completed extensive health risk assessment - reviewed and discussed with the patient: See Health Risk Assessment completed with patient prior to the visit either above or in recent phone note. This was reviewed in detailed with the patient today and appropriate recommendations, orders and referrals were placed as needed per Summary below and patient instructions.   Review of Medical History: -PMH, PSH, Family History and current specialty and care providers reviewed and updated and listed below   Patient Care Team: Billy Philippe SAUNDERS, NP as PCP - General (Family Medicine) Octavia Bruckner, MD as Consulting Physician (Optometry) Alvia Norleen BIRCH, MD as Consulting Physician (Ophthalmology) Debrah Lamar BIRCH, MD (Inactive) as Consulting Physician (Gastroenterology) Ivin Kocher, MD as Consulting Physician (Dermatology) Shona Norleen, MD (Dermatology) Cary No, NP as Nurse Practitioner (Family Medicine)   Past Medical History:  Diagnosis Date   Allergic rhinitis    Allergy    Anxiety    with medical procedures   Gout    Hyperlipidemia    Hypertension    Obesity    OSA (obstructive sleep apnea)    wears CPAP   Type II or unspecified type diabetes mellitus with renal manifestations, not stated as uncontrolled(250.40)    Unspecified hypothyroidism    Vitamin D  deficiency    Vitiligo     Past Surgical History:  Procedure Laterality Date    eye procedure     have treatment where a needle is stuck into left eye- since June 2014; now every six weeks   BOWEL RESECTION N/A 07/24/2022   Procedure: SMALL BOWEL RESECTION;   Surgeon: Rubin Calamity, MD;  Location: [facility] OR;  Service: General;  Laterality: N/A;   DENTAL EXAMINATION UNDER ANESTHESIA     ENDOVENOUS ABLATION SAPHENOUS VEIN W/ LASER Left 12/31/2022   endovenous laser ablation left SSV by Medford Blade MD   LAPAROSCOPY N/A 07/24/2022   Procedure: LAPAROSCOPY DIAGNOSTIC;  Surgeon: Rubin Calamity, MD;  Location: [facility] OR;  Service: General;  Laterality: N/A;   LAPAROTOMY N/A 07/24/2022   Procedure: EXPLORATORY LAPAROTOMY;  Surgeon: Rubin Calamity, MD;  Location: [facility] OR;  Service: General;  Laterality: N/A;   TUBAL LIGATION     UMBILICAL HERNIA REPAIR N/A 07/24/2022   Procedure: PRIMARY UMBILICAL HERNIA REPAIR;  Surgeon: Rubin Calamity, MD;  Location: [facility] OR;  Service: General;  Laterality: N/A;    Social History   Socioeconomic History   Marital status: Married    Spouse name: Charles   Number of children: 1   Years of education: Not on file   Highest education level: Some college, no degree  Occupational History   Occupation: Retired  Tobacco Use   Smoking status: Never   Smokeless tobacco: Never  Vaping Use   Vaping status: Never Used  Substance and Sexual Activity   Alcohol use: Not Currently    Comment: very rarely   Drug use: No   Sexual activity: Not Currently    Partners: Male    Birth control/protection: Post-menopausal  Other Topics Concern   Not on file  Social History Narrative   Lives with husband    Right handed   Caffeine: 1 cup of coffee  every 2 weeks   Social Drivers of Corporate investment banker Strain: Low Risk  (03/18/2024)   Overall Financial Resource Strain (CARDIA)    Difficulty of Paying Living Expenses: Not hard at all  Food Insecurity: No Food Insecurity (03/18/2024)   Hunger Vital Sign    Worried About Running Out of Food in the Last Year: Never true    Ran Out of Food in the Last Year: Never true  Transportation Needs: No Transportation Needs (03/18/2024)   PRAPARE - Scientist, research (physical sciences) (Medical): No    Lack of Transportation (Non-Medical): No  Physical Activity: Insufficiently Active (03/18/2024)   Exercise Vital Sign    Days of Exercise per Week: 1 day    Minutes of Exercise per Session: 10 min  Stress: No Stress Concern Present (03/18/2024)   [employer] of Occupational Health - Occupational Stress Questionnaire    Feeling of Stress : Only a little  Social Connections: Socially Integrated (03/18/2024)   Social Connection and Isolation Panel    Frequency of Communication with Friends and Family: More than three times a week    Frequency of Social Gatherings with Friends and Family: Once a week    Attends Religious Services: 1 to 4 times per year    Active Member of [employer] or Organizations: Yes    Attends Banker Meetings: 1 to 4 times per year    Marital Status: Married  Catering manager Violence: Not At Risk (03/18/2024)   Humiliation, Afraid, Rape, and Kick questionnaire    Fear of Current or Ex-Partner: No    Emotionally Abused: No    Physically Abused: No    Sexually Abused: No    Family History  Problem Relation Age of Onset   Breast cancer Mother    Renal Disease Mother    Hypertension Sister    Diabetes Sister    Diabetes Maternal Grandmother    Colon cancer Neg Hx    Esophageal cancer Neg Hx    Stomach cancer Neg Hx    Rectal cancer Neg Hx     Current Outpatient Medications on File Prior to Visit  Medication Sig Dispense Refill   acetaminophen  (TYLENOL ) 650 MG CR tablet Take 1,300 mg by mouth every 8 (eight) hours as needed for pain.     allopurinol  (ZYLOPRIM ) 300 MG tablet TAKE 1/2 TABLET(150 MG) BY MOUTH DAILY FOR GOUT PREVENTION 45 tablet 2   Ascorbic Acid (VITAMIN C) 1000 MG tablet Take 1,000 mg by mouth daily.     aspirin  EC 81 MG tablet Take 81 mg by mouth daily.     atenolol  (TENORMIN ) 100 MG tablet Take 1 tablet by mouth once daily for blood pressure 90 tablet 2   Cholecalciferol (VITAMIN D3) 5000 units CAPS  Take 5,000 Units by mouth daily.     ciprofloxacin  (CILOXAN ) 0.3 % ophthalmic solution      colchicine  0.6 MG tablet Take 1 tablet daily for prevention of Gout 90 tablet 3   Cyanocobalamin (B-12) 1000 MCG SUBL Place 1,000 mcg under the tongue 2 (two) times a week. Saturday and Sunday     dorzolamide -timolol  (COSOPT ) 22.3-6.8 MG/ML ophthalmic solution Place 1 drop into the left eye 2 (two) times daily.     Dulaglutide  (TRULICITY ) 3 MG/0.5ML SOAJ INJECT 3 MG  SUBCUTANEOUSLY ONCE A WEEK FOR DIABETES 12 mL 3   Finerenone  (KERENDIA ) 10 MG TABS Take 10 mg by mouth daily. Take  1 tablet  Daily  for Diabetic Kidneys (Patient taking differently: Take 10 mg by mouth daily. Diabetic Kidneys) 90 tablet 0   furosemide  (LASIX ) 80 MG tablet TAKE 1 TABLET BY MOUTH 1 TO 2 TIMES PER DAY AS NEEDED FOR FLUID RETENTION OR ANKLE SWELLING 180 tablet 3   glucose blood (ONETOUCH ULTRA) test strip 1 each by Other route in the morning, at noon, and at bedtime. Use as instructed 300 strip 3   hyoscyamine  (LEVSIN , ANASPAZ ) 0.125 MG tablet Take 1 tablet (0.125 mg total) by mouth every 6 (six) hours as needed. 60 tablet 1   insulin  NPH-regular Human (70-30) 100 UNIT/ML injection Patient injects 25 units in the AM and 10 units in the PM. Reduce each insulin  dose by 2 units if glucose persistently <100 10 mL 3   Lancets (ONETOUCH ULTRASOFT) lancets Check blood sugar 3 times daily-[id number] 300 each 1   levothyroxine  (SYNTHROID ) 125 MCG tablet TAKE 1 TABLET BY MOUTH DAILY ON AN EMPTY STOMACH WITH ONLY WATER FOR 30 MINUTES AND NO ANTACID MED, CALCIUM  OR MAGNESIUM FOR 4 HOURS. AVOID BIOTIN 90 tablet 3   polymyxin B 500,000 Units in sodium chloride  irrigation 0.9 % 500 mL Irrigate with 1 Application as directed every 30 (thirty) days. Eye Injection     rosuvastatin  (CRESTOR ) 5 MG tablet Take 1 tablet once weekly as directed for Cholesterol LDL goal less than 70. 13 tablet 3   zinc gluconate 50 MG tablet Take 50 mg by mouth daily. 3 times  weekly     No current facility-administered medications on file prior to visit.    Allergies  Allergen Reactions   Penicillins Swelling   Sulfa  Antibiotics Itching and Rash       Physical Exam Vitals requested from patient and listed below if patient had equipment and was able to obtain at home for this virtual visit: There were no vitals filed for this visit. Estimated body mass index is 41.1 kg/m as calculated from the following:   Height as of 06/17/24: 5' 3 (1.6 m).   Weight as of 06/17/24: 232 lb (105.2 kg).  EKG (optional): deferred due to virtual visit  GENERAL: alert, oriented, no acute distress detected, full vision exam deferred due to pandemic and/or virtual encounter  PSYCH/NEURO: pleasant and cooperative, no obvious depression or anxiety, speech and thought processing grossly intact, Cognitive function grossly intact        07/12/2024   11:05 AM 01/03/2024    4:43 PM 03/05/2023   10:23 AM 03/04/2022   11:02 AM 08/08/2021    9:11 AM  Depression screen PHQ 2/9  Decreased Interest 0 0 0 0 0  Down, Depressed, Hopeless 0 0 0 0 0  PHQ - 2 Score 0 0 0 0 0       07/31/2022    9:30 PM 08/01/2022    9:00 AM 03/05/2023   10:23 AM 01/03/2024    4:42 PM 07/12/2024   11:05 AM  Fall Risk  Falls in the past year?   0 0 0  Was there an injury with Fall?   0  0  Fall Risk Category Calculator   0  0  (RETIRED) Patient Fall Risk Level Moderate fall risk  Moderate fall risk      Patient at Risk for Falls Due to   Impaired balance/gait;Impaired mobility No Fall Risks   Fall risk Follow up   Falls evaluation completed;Falls prevention discussed Falls prevention discussed;Education provided;Falls evaluation completed Falls evaluation completed;Education provided     Data  saved with a previous flowsheet row definition     SUMMARY AND PLAN:  Encounter for Medicare annual wellness exam  Discussed applicable health maintenance/preventive health measures and advised and referred  or ordered per patient preferences: -discussed vaccines dues recs and risks, advised can get at the pharmacy and to let us  know if she does so that we can update record -discussed foot exam, she agrees to request/do next time she sees [facility] Maintenance  Topic Date Due   Zoster Vaccines- Shingrix (1 of 2) Never done   COVID-19 Vaccine (6 - 2024-25 season) 08/23/2023   FOOT EXAM  03/04/2024   INFLUENZA VACCINE  07/22/2024   Diabetic kidney evaluation - Urine ACR  09/22/2024   HEMOGLOBIN A1C  12/17/2024   OPHTHALMOLOGY EXAM  12/21/2024   Diabetic kidney evaluation - eGFR measurement  06/17/2025   DTaP/Tdap/Td (3 - Tdap) 06/18/2025   Medicare Annual Wellness (AWV)  07/12/2025   Pneumococcal Vaccine: 50+ Years  Completed   DEXA SCAN  Completed   Hepatitis C Screening  Completed   Hepatitis B Vaccines  Aged Out   HPV VACCINES  Aged Out   Meningococcal B Vaccine  Aged Out   Colonoscopy  Discontinued      Education and counseling on the following was provided based on the above review of health and a plan/checklist for the patient, along with additional information discussed, was provided for the patient in the patient instructions :  -Advised on importance of completing advanced directives, discussed options for completing and provided information in patient instructions as well -Advised and counseled on a healthy lifestyle - including the importance of a healthy diet, regular physical activity,  and stress management. -Reviewed patient's current diet. Advised and counseled on a whole foods based healthy diet. Discussed consuming adequate protein, water, plenty of veggies and fruits and avoiding ultraprocessed food/drink. A summary of a healthy diet was provided in the Patient Instructions.  -reviewed patient's current physical activity level and discussed exercise guidelines for adults. Discussed community resources and ideas for safe exercise at home to assist in meeting exercise  guideline recommendations in a safe and healthy way.  -Advise yearly dental visits at minimum and regular eye exams   Follow up: see patient instructions     Patient Instructions  I really enjoyed getting to talk with you today! I am available on Tuesdays and Thursdays for virtual visits if you have any questions or concerns, or if I can be of any further assistance.   CHECKLIST FROM ANNUAL WELLNESS VISIT:  -Follow up (please call to schedule if not scheduled after visit):   -yearly for annual wellness visit with primary care office  Here is a list of your preventive care/health maintenance measures and the plan for each if any are due:  PLAN For any measures below that may be due:    1. Can get shingles, flu and covid vaccines in the fall at the pharmacy. Please let us  know when you do so that we can update your record.   2. Please request foot exam the next time you are in the office.   Health Maintenance  Topic Date Due   Zoster Vaccines- Shingrix (1 of 2) Never done   COVID-19 Vaccine (6 - 2024-25 season) 08/23/2023   FOOT EXAM  03/04/2024   INFLUENZA VACCINE  07/22/2024   Diabetic kidney evaluation - Urine ACR  09/22/2024   HEMOGLOBIN A1C  12/17/2024   OPHTHALMOLOGY EXAM  12/21/2024   Diabetic kidney  evaluation - eGFR measurement  06/17/2025   DTaP/Tdap/Td (3 - Tdap) 06/18/2025   Medicare Annual Wellness (AWV)  07/12/2025   Pneumococcal Vaccine: 50+ Years  Completed   DEXA SCAN  Completed   Hepatitis C Screening  Completed   Hepatitis B Vaccines  Aged Out   HPV VACCINES  Aged Out   Meningococcal B Vaccine  Aged Out   Colonoscopy  Discontinued    -See a dentist at least yearly  -Get your eyes checked and then per your eye specialist's recommendations  -Other issues addressed today:   -I have included below further information regarding a healthy whole foods based diet, physical activity guidelines for adults, stress management and opportunities for social  connections. I hope you find this information useful.   -----------------------------------------------------------------------------------------------------------------------------------------------------------------------------------------------------------------------------------------------------------    NUTRITION: -eat real food: lots of colorful vegetables (half the plate) and fruits -5-7 servings of vegetables and fruits per day (fresh or steamed is best), exp. 2 servings of vegetables with lunch and dinner and 2 servings of fruit per day. Berries and greens such as kale and collards are great choices.  -consume on a regular basis:  fresh fruits, fresh veggies, fish, nuts, seeds, healthy oils (such as olive oil, avocado oil), whole grains (make sure for bread/pasta/crackers/etc., that the first ingredient on label contains the word whole), legumes. -can eat small amounts of dairy and lean meat (no larger than the palm of your hand), but avoid processed meats such as ham, bacon, lunch meat, etc. -drink water -try to avoid fast food and pre-packaged foods, processed meat, ultra processed foods/beverages (donuts, candy, etc.) -most experts advise limiting sodium to < 2300mg  per day, should limit further is any chronic conditions such as high blood pressure, heart disease, diabetes, etc. The American Heart Association advised that < 1500mg  is is ideal -try to avoid foods/beverages that contain any ingredients with names you do not recognize  -try to avoid foods/beverages  with added sugar or sweeteners/sweets  -try to avoid sweet drinks (including diet drinks): soda, juice, Gatorade, sweet tea, power drinks, diet drinks -try to avoid white rice, white bread, pasta (unless whole grain)  EXERCISE GUIDELINES FOR ADULTS: -if you wish to increase your physical activity, do so gradually and with the approval of your doctor -STOP and seek medical care immediately if you have any chest pain, chest  discomfort or trouble breathing when starting or increasing exercise  -move and stretch your body, legs, feet and arms when sitting for long periods -Physical activity guidelines for optimal health in adults: -get at least 150 minutes per week of moderate exercise (can talk, but not sing); this is about 20-30 minutes of sustained activity 5-7 days per week or two 10-15 minute episodes of sustained activity 5-7 days per week -do some muscle building/resistance training/strength training at least 2 days per week  -balance exercises 3+ days per week:   Stand somewhere where you have something sturdy to hold onto if you lose balance    1) lift up on toes, then back down, start with 5x per day and work up to 20x   2) stand and lift one leg straight out to the side so that foot is a few inches of the floor, start with 5x each side and work up to 20x each side   3) stand on one foot, start with 5 seconds each side and work up to 20 seconds on each side  If you need ideas or help with getting more active:  -Silver  sneakers https://tools.silversneakers.com  -Walk with a Doc: http://www.duncan-williams.com/  -try to include resistance (weight lifting/strength building) and balance exercises twice per week: or the following link for ideas: http://castillo-powell.com/  BuyDucts.dk  STRESS MANAGEMENT: -can try meditating, or just sitting quietly with deep breathing while intentionally relaxing all parts of your body for 5 minutes daily -if you need further help with stress, anxiety or depression please follow up with your primary doctor or contact the wonderful folks at [facility]: [phone]  SOCIAL CONNECTIONS: -options in [place] if you wish to engage in more social and exercise related activities:  -Silver sneakers https://tools.silversneakers.com  -Walk with a  Doc: http://www.duncan-williams.com/  -Check out the [facility] Active Adults 50+ section on the [place] of [employer] (hiking clubs, book clubs, cards and games, chess, exercise classes, aquatic classes and much more) - see the website for details: https://www.[place].gov/departments/parks-recreation/active-adults50  -YouTube has lots of exercise videos for different ages and abilities as well  -[facility] (a variety of indoor and outdoor inperson activities for adults). 737-031-7612. [address]  -Virtual Online Classes (a variety of topics): see seniorplanet.org or call [phone]  -consider volunteering at a school, hospice center, church, senior center or elsewhere     ADVANCED HEALTHCARE DIRECTIVES:  [place] Advanced Directives assistance:   ExpressWeek.com.cy  Everyone should have advanced health care directives in place. This is so that you get the care you want, should you ever be in a situation where you are unable to make your own medical decisions.   From the [place] Advanced Directive Website: Advance Health Care Directives are legal documents in which you give written instructions about your health care if, in the future, you cannot speak for yourself.   A health care power of attorney allows you to name a person you trust to make your health care decisions if you cannot make them yourself. A declaration of a desire for a natural death (or living will) is document, which states that you desire not to have your life prolonged by extraordinary measures if you have a terminal or incurable illness or if you are in a vegetative state. An advance instruction for mental health treatment makes a declaration of instructions, information and preferences regarding your mental health treatment. It also states that you are aware that the advance instruction authorizes a mental health treatment provider to  act according to your wishes. It may also outline your consent or refusal of mental health treatment. A declaration of an anatomical gift allows anyone over the age of 63 to make a gift by will, organ donor card or other document.   Please see the following website or an elder law attorney for forms, FAQs and for completion of advanced directives:   Print production planner Health Care Directives Advance Health Care Directives (http://guzman.com/)  Or copy and paste the following to your web browser: [email or web address]         Chiquita JONELLE Cramp, DO

## 2024-07-12 NOTE — Patient Instructions (Signed)
 I really enjoyed getting to talk with you today! I am available on Tuesdays and Thursdays for virtual visits if you have any questions or concerns, or if I can be of any further assistance.   CHECKLIST FROM ANNUAL WELLNESS VISIT:  -Follow up (please call to schedule if not scheduled after visit):   -yearly for annual wellness visit with primary care office  Here is a list of your preventive care/health maintenance measures and the plan for each if any are due:  PLAN For any measures below that may be due:    1. Can get shingles, flu and covid vaccines in the fall at the pharmacy. Please let us  know when you do so that we can update your record.   2. Please request foot exam the next time you are in the office.   Health Maintenance  Topic Date Due   Zoster Vaccines- Shingrix (1 of 2) Never done   COVID-19 Vaccine (6 - 2024-25 season) 08/23/2023   FOOT EXAM  03/04/2024   INFLUENZA VACCINE  07/22/2024   Diabetic kidney evaluation - Urine ACR  09/22/2024   HEMOGLOBIN A1C  12/17/2024   OPHTHALMOLOGY EXAM  12/21/2024   Diabetic kidney evaluation - eGFR measurement  06/17/2025   DTaP/Tdap/Td (3 - Tdap) 06/18/2025   Medicare Annual Wellness (AWV)  07/12/2025   Pneumococcal Vaccine: 50+ Years  Completed   DEXA SCAN  Completed   Hepatitis C Screening  Completed   Hepatitis B Vaccines  Aged Out   HPV VACCINES  Aged Out   Meningococcal B Vaccine  Aged Out   Colonoscopy  Discontinued    -See a dentist at least yearly  -Get your eyes checked and then per your eye specialist's recommendations  -Other issues addressed today:   -I have included below further information regarding a healthy whole foods based diet, physical activity guidelines for adults, stress management and opportunities for social connections. I hope you find this information useful.    -----------------------------------------------------------------------------------------------------------------------------------------------------------------------------------------------------------------------------------------------------------    NUTRITION: -eat real food: lots of colorful vegetables (half the plate) and fruits -5-7 servings of vegetables and fruits per day (fresh or steamed is best), exp. 2 servings of vegetables with lunch and dinner and 2 servings of fruit per day. Berries and greens such as kale and collards are great choices.  -consume on a regular basis:  fresh fruits, fresh veggies, fish, nuts, seeds, healthy oils (such as olive oil, avocado oil), whole grains (make sure for bread/pasta/crackers/etc., that the first ingredient on label contains the word whole), legumes. -can eat small amounts of dairy and lean meat (no larger than the palm of your hand), but avoid processed meats such as ham, bacon, lunch meat, etc. -drink water -try to avoid fast food and pre-packaged foods, processed meat, ultra processed foods/beverages (donuts, candy, etc.) -most experts advise limiting sodium to < 2300mg  per day, should limit further is any chronic conditions such as high blood pressure, heart disease, diabetes, etc. The American Heart Association advised that < 1500mg  is is ideal -try to avoid foods/beverages that contain any ingredients with names you do not recognize  -try to avoid foods/beverages  with added sugar or sweeteners/sweets  -try to avoid sweet drinks (including diet drinks): soda, juice, Gatorade, sweet tea, power drinks, diet drinks -try to avoid white rice, white bread, pasta (unless whole grain)  EXERCISE GUIDELINES FOR ADULTS: -if you wish to increase your physical activity, do so gradually and with the approval of your doctor -STOP and seek medical care  immediately if you have any chest pain, chest discomfort or trouble breathing when starting or  increasing exercise  -move and stretch your body, legs, feet and arms when sitting for long periods -Physical activity guidelines for optimal health in adults: -get at least 150 minutes per week of moderate exercise (can talk, but not sing); this is about 20-30 minutes of sustained activity 5-7 days per week or two 10-15 minute episodes of sustained activity 5-7 days per week -do some muscle building/resistance training/strength training at least 2 days per week  -balance exercises 3+ days per week:   Stand somewhere where you have something sturdy to hold onto if you lose balance    1) lift up on toes, then back down, start with 5x per day and work up to 20x   2) stand and lift one leg straight out to the side so that foot is a few inches of the floor, start with 5x each side and work up to 20x each side   3) stand on one foot, start with 5 seconds each side and work up to 20 seconds on each side  If you need ideas or help with getting more active:  -Silver sneakers https://tools.silversneakers.com  -Walk with a Doc: http://www.duncan-williams.com/  -try to include resistance (weight lifting/strength building) and balance exercises twice per week: or the following link for ideas: http://castillo-powell.com/  BuyDucts.dk  STRESS MANAGEMENT: -can try meditating, or just sitting quietly with deep breathing while intentionally relaxing all parts of your body for 5 minutes daily -if you need further help with stress, anxiety or depression please follow up with your primary doctor or contact the wonderful folks at [facility]: [phone]  SOCIAL CONNECTIONS: -options in [place] if you wish to engage in more social and exercise related activities:  -Silver sneakers https://tools.silversneakers.com  -Walk with a Doc: http://www.duncan-williams.com/  -Check out the [facility] Active Adults 50+  section on the [place] of [employer] (hiking clubs, book clubs, cards and games, chess, exercise classes, aquatic classes and much more) - see the website for details: https://www.[place].gov/departments/parks-recreation/active-adults50  -YouTube has lots of exercise videos for different ages and abilities as well  -[facility] (a variety of indoor and outdoor inperson activities for adults). [phone]. [address]  -Virtual Online Classes (a variety of topics): see seniorplanet.org or call [phone]  -consider volunteering at a school, hospice center, church, senior center or elsewhere     ADVANCED HEALTHCARE DIRECTIVES:  [place] Advanced Directives assistance:   ExpressWeek.com.cy  Everyone should have advanced health care directives in place. This is so that you get the care you want, should you ever be in a situation where you are unable to make your own medical decisions.   From the [place] Advanced Directive Website: Advance Health Care Directives are legal documents in which you give written instructions about your health care if, in the future, you cannot speak for yourself.   A health care power of attorney allows you to name a person you trust to make your health care decisions if you cannot make them yourself. A declaration of a desire for a natural death (or living will) is document, which states that you desire not to have your life prolonged by extraordinary measures if you have a terminal or incurable illness or if you are in a vegetative state. An advance instruction for mental health treatment makes a declaration of instructions, information and preferences regarding your mental health treatment. It also states that you are aware that  the advance instruction authorizes a mental health treatment provider to act according to your wishes. It may also outline your consent or refusal of  mental health treatment. A declaration of an anatomical gift allows anyone over the age of 70 to make a gift by will, organ donor card or other document.   Please see the following website or an elder law attorney for forms, FAQs and for completion of advanced directives: [place]  Print production planner Health Care Directives Advance Health Care Directives (http://guzman.[email or web address]/)  Or copy and paste the following to your web browser: [email or web address]

## 2024-08-08 ENCOUNTER — Encounter (INDEPENDENT_AMBULATORY_CARE_PROVIDER_SITE_OTHER): Admitting: Ophthalmology

## 2024-08-08 DIAGNOSIS — H34812 Central retinal vein occlusion, left eye, with macular edema: Secondary | ICD-10-CM

## 2024-08-08 DIAGNOSIS — I10 Essential (primary) hypertension: Secondary | ICD-10-CM

## 2024-08-08 DIAGNOSIS — H43813 Vitreous degeneration, bilateral: Secondary | ICD-10-CM | POA: Diagnosis not present

## 2024-08-08 DIAGNOSIS — H35033 Hypertensive retinopathy, bilateral: Secondary | ICD-10-CM

## 2024-09-01 ENCOUNTER — Other Ambulatory Visit: Payer: Self-pay | Admitting: Nurse Practitioner

## 2024-09-01 DIAGNOSIS — M1A9XX0 Chronic gout, unspecified, without tophus (tophi): Secondary | ICD-10-CM

## 2024-09-05 ENCOUNTER — Encounter (INDEPENDENT_AMBULATORY_CARE_PROVIDER_SITE_OTHER): Admitting: Ophthalmology

## 2024-09-05 DIAGNOSIS — H43813 Vitreous degeneration, bilateral: Secondary | ICD-10-CM

## 2024-09-05 DIAGNOSIS — I10 Essential (primary) hypertension: Secondary | ICD-10-CM | POA: Diagnosis not present

## 2024-09-05 DIAGNOSIS — H35033 Hypertensive retinopathy, bilateral: Secondary | ICD-10-CM

## 2024-09-05 DIAGNOSIS — H34812 Central retinal vein occlusion, left eye, with macular edema: Secondary | ICD-10-CM | POA: Diagnosis not present

## 2024-09-06 ENCOUNTER — Other Ambulatory Visit: Payer: Self-pay | Admitting: Family Medicine

## 2024-09-06 DIAGNOSIS — M1A9XX0 Chronic gout, unspecified, without tophus (tophi): Secondary | ICD-10-CM

## 2024-09-06 NOTE — Telephone Encounter (Unsigned)
 Copied from CRM #[id number]. Topic: Clinical - Medication Refill >> Sep 06, 2024  4:06 PM Lauren C wrote: Medication: colchicine  0.6 MG tablet   Has the patient contacted their pharmacy? Yes (Agent: If no, request that the patient contact the pharmacy for the refill. If patient does not wish to contact the pharmacy document the reason why and proceed with request.) (Agent: If yes, when and what did the pharmacy advise?)  Per pharmacy, last ordering provider is deceased and she is needing a new refill sent under [facility] name.   This is the patient's preferred pharmacy:  [facility] [phone] - [place], [place] - [address] AT [facility] [address] [place] [address] Phone: [phone] Fax: [phone]   Is this the correct pharmacy for this prescription? Yes If no, delete pharmacy and type the correct one.   Has the prescription been filled recently? Yes, last filled 3/28  Is the patient out of the medication? Yes  Has the patient been seen for an appointment in the last year OR does the patient have an upcoming appointment? Yes  Can we respond through MyChart? No, [phone]  Agent: Please be advised that Rx refills may take up to 3 business days. We ask that you follow-up with your pharmacy.

## 2024-09-07 MED ORDER — COLCHICINE 0.6 MG PO TABS
ORAL_TABLET | ORAL | 3 refills | Status: AC
Start: 2024-09-07 — End: ?

## 2024-09-20 ENCOUNTER — Ambulatory Visit: Admitting: Family Medicine

## 2024-09-20 ENCOUNTER — Encounter: Payer: Self-pay | Admitting: Family Medicine

## 2024-09-20 VITALS — BP 136/88 | HR 90 | Temp 97.9°F | Ht 63.0 in | Wt 231.0 lb

## 2024-09-20 DIAGNOSIS — E1169 Type 2 diabetes mellitus with other specified complication: Secondary | ICD-10-CM | POA: Diagnosis not present

## 2024-09-20 DIAGNOSIS — Z6841 Body Mass Index (BMI) 40.0 and over, adult: Secondary | ICD-10-CM

## 2024-09-20 DIAGNOSIS — I10 Essential (primary) hypertension: Secondary | ICD-10-CM | POA: Diagnosis not present

## 2024-09-20 DIAGNOSIS — Z0000 Encounter for general adult medical examination without abnormal findings: Secondary | ICD-10-CM

## 2024-09-20 DIAGNOSIS — I5032 Chronic diastolic (congestive) heart failure: Secondary | ICD-10-CM

## 2024-09-20 DIAGNOSIS — D649 Anemia, unspecified: Secondary | ICD-10-CM | POA: Diagnosis not present

## 2024-09-20 DIAGNOSIS — E039 Hypothyroidism, unspecified: Secondary | ICD-10-CM | POA: Diagnosis not present

## 2024-09-20 DIAGNOSIS — N1832 Chronic kidney disease, stage 3b: Secondary | ICD-10-CM

## 2024-09-20 DIAGNOSIS — Z794 Long term (current) use of insulin: Secondary | ICD-10-CM | POA: Diagnosis not present

## 2024-09-20 DIAGNOSIS — E1122 Type 2 diabetes mellitus with diabetic chronic kidney disease: Secondary | ICD-10-CM

## 2024-09-20 DIAGNOSIS — E785 Hyperlipidemia, unspecified: Secondary | ICD-10-CM

## 2024-09-20 DIAGNOSIS — M1000 Idiopathic gout, unspecified site: Secondary | ICD-10-CM

## 2024-09-20 LAB — CBC WITH DIFFERENTIAL/PLATELET
Basophils Absolute: 0.1 K/uL (ref 0.0–0.1)
Basophils Relative: 1.1 % (ref 0.0–3.0)
Eosinophils Absolute: 0.1 K/uL (ref 0.0–0.7)
Eosinophils Relative: 1.2 % (ref 0.0–5.0)
HCT: 35.1 % — ABNORMAL LOW (ref 36.0–46.0)
Hemoglobin: 11.4 g/dL — ABNORMAL LOW (ref 12.0–15.0)
Lymphocytes Relative: 26.3 % (ref 12.0–46.0)
Lymphs Abs: 1.9 K/uL (ref 0.7–4.0)
MCHC: 32.5 g/dL (ref 30.0–36.0)
MCV: 87.4 fl (ref 78.0–100.0)
Monocytes Absolute: 0.6 K/uL (ref 0.1–1.0)
Monocytes Relative: 7.6 % (ref 3.0–12.0)
Neutro Abs: 4.7 K/uL (ref 1.4–7.7)
Neutrophils Relative %: 63.8 % (ref 43.0–77.0)
Platelets: 266 K/uL (ref 150.0–400.0)
RBC: 4.02 Mil/uL (ref 3.87–5.11)
RDW: 15.4 % (ref 11.5–15.5)
WBC: 7.3 K/uL (ref 4.0–10.5)

## 2024-09-20 LAB — COMPREHENSIVE METABOLIC PANEL WITH GFR
ALT: 13 U/L (ref 0–35)
AST: 19 U/L (ref 0–37)
Albumin: 4 g/dL (ref 3.5–5.2)
Alkaline Phosphatase: 102 U/L (ref 39–117)
BUN: 19 mg/dL (ref 6–23)
CO2: 31 meq/L (ref 19–32)
Calcium: 9.3 mg/dL (ref 8.4–10.5)
Chloride: 101 meq/L (ref 96–112)
Creatinine, Ser: 1.25 mg/dL — ABNORMAL HIGH (ref 0.40–1.20)
GFR: 41.29 mL/min — ABNORMAL LOW (ref >60.00)
Glucose, Bld: 116 mg/dL — ABNORMAL HIGH (ref 70–99)
Potassium: 3.9 meq/L (ref 3.5–5.1)
Sodium: 139 meq/L (ref 135–145)
Total Bilirubin: 0.8 mg/dL (ref 0.2–1.2)
Total Protein: 7.1 g/dL (ref 6.0–8.3)

## 2024-09-20 LAB — LIPID PANEL
Cholesterol: 109 mg/dL (ref 0–200)
HDL: 33.8 mg/dL — ABNORMAL LOW (ref >39.00)
LDL Cholesterol: 42 mg/dL (ref 0–99)
NonHDL: 75.17
Total CHOL/HDL Ratio: 3
Triglycerides: 168 mg/dL — ABNORMAL HIGH (ref 0.0–149.0)
VLDL: 33.6 mg/dL (ref 0.0–40.0)

## 2024-09-20 LAB — MICROALBUMIN / CREATININE URINE RATIO
Creatinine,U: 108.7 mg/dL
Microalb Creat Ratio: 26.5 mg/g (ref 0.0–30.0)
Microalb, Ur: 2.9 mg/dL — ABNORMAL HIGH (ref 0.0–1.9)

## 2024-09-20 LAB — HEMOGLOBIN A1C: Hgb A1c MFr Bld: 5.5 % (ref 4.6–6.5)

## 2024-09-20 LAB — TSH: TSH: 1.58 u[IU]/mL (ref 0.35–5.50)

## 2024-09-20 MED ORDER — ALLOPURINOL 300 MG PO TABS: ORAL_TABLET | ORAL | 2 refills | Status: AC

## 2024-09-20 NOTE — Progress Notes (Signed)
 Complete physical exam  Patient: Jocelyn Camacho   DOB: November 06, 1946   78 y.o. Female  MRN: [id number]  Subjective:    Jocelyn Camacho is a 78 y.o. female who presents today for a complete physical exam. She reports consuming a general diet. She does not exercise much, uses a cane to get around. She generally feels fairly well. She reports sleeping fairly well. She does not have additional problems to discuss today.   Most recent fall risk assessment:    09/20/2024    9:47 AM  Fall Risk   Falls in the past year? 0  Number falls in past yr: 0  Injury with Fall? 0  Risk for fall due to : No Fall Risks  Follow up Falls evaluation completed   Most recent depression screenings:    09/20/2024    9:47 AM 07/12/2024   11:05 AM  PHQ 2/9 Scores  PHQ - 2 Score 0 0  PHQ- 9 Score 0    Vision:Within last year and Dental: No current dental problems and No regular dental care   Patient Active Problem List   Diagnosis Date Noted   Strangulated umbilical hernia 07/24/2022   AKI (acute kidney injury) 07/24/2022   Glaucoma 07/24/2022   Chronic intermittent hypoxia with obstructive sleep apnea 01/16/2022   Ankle edema, bilateral 12/09/2021   Poor sleep hygiene 10/24/2021   Loud snoring 10/24/2021   Sleeps in sitting position due to orthopnea 10/24/2021   Obesity, Class III, BMI 40-49.9 (morbid obesity) 10/24/2021   Venous stasis of both lower extremities 08/02/2020   Hyperparathyroidism, secondary renal 08/03/2019   Class 3 severe obesity due to excess calories with serious comorbidity and body mass index (BMI) of 45.0 to 49.9 in adult 04/03/2019   Diabetic retinopathy associated with type 2 diabetes mellitus (HCC) 06/10/2018   Anemia of chronic disease 05/28/2018   CKD stage 3 due to type 2 diabetes mellitus (HCC) 05/26/2018   Venous stasis ulcer of left ankle limited to breakdown of skin without varicose veins (HCC) 04/30/2018   Hyperlipidemia associated with type 2 diabetes mellitus (HCC)  11/16/2017   Gout 07/07/2016   Generalized anxiety disorder 02/18/2016   Macular degeneration 06/19/2015   Medication management 03/29/2014   Essential hypertension 11/16/2013   Type 2 diabetes mellitus with stage 2 chronic kidney disease, with long-term current use of insulin  (HCC) 11/16/2013   Vitamin D  deficiency 11/16/2013   OSA (obstructive sleep apnea) 11/16/2013   Hypothyroidism 11/16/2013   Patient Care Team: Billy Philippe SAUNDERS, NP as PCP - General (Family Medicine) Octavia Bruckner, MD as Consulting Physician (Optometry) Alvia Norleen BIRCH, MD as Consulting Physician (Ophthalmology) Cary No, NP as Nurse Practitioner (Family Medicine)   Outpatient Medications Prior to Visit  Medication Sig   acetaminophen  (TYLENOL ) 650 MG CR tablet Take 1,300 mg by mouth every 8 (eight) hours as needed for pain.   Ascorbic Acid (VITAMIN C) 1000 MG tablet Take 1,000 mg by mouth daily.   aspirin  EC 81 MG tablet Take 81 mg by mouth daily.   atenolol  (TENORMIN ) 100 MG tablet Take 1 tablet by mouth once daily for blood pressure   Cholecalciferol (VITAMIN D3) 5000 units CAPS Take 5,000 Units by mouth daily.   ciprofloxacin  (CILOXAN ) 0.3 % ophthalmic solution    colchicine  0.6 MG tablet Take 1 tablet daily for prevention of Gout   Cyanocobalamin (B-12) 1000 MCG SUBL Place 1,000 mcg under the tongue 2 (two) times a week. Saturday and Sunday   dorzolamide -timolol  (  COSOPT ) 22.3-6.8 MG/ML ophthalmic solution Place 1 drop into the left eye 2 (two) times daily.   Dulaglutide  (TRULICITY ) 3 MG/0.5ML SOAJ INJECT 3 MG  SUBCUTANEOUSLY ONCE A WEEK FOR DIABETES   Finerenone  (KERENDIA ) 10 MG TABS Take 10 mg by mouth daily. Take  1 tablet  Daily  for Diabetic Kidneys (Patient taking differently: Take 10 mg by mouth daily. Diabetic Kidneys)   furosemide  (LASIX ) 80 MG tablet TAKE 1 TABLET BY MOUTH 1 TO 2 TIMES PER DAY AS NEEDED FOR FLUID RETENTION OR ANKLE SWELLING   glucose blood (ONETOUCH ULTRA) test strip 1  each by Other route in the morning, at noon, and at bedtime. Use as instructed   hyoscyamine  (LEVSIN , ANASPAZ ) 0.125 MG tablet Take 1 tablet (0.125 mg total) by mouth every 6 (six) hours as needed.   insulin  NPH-regular Human (70-30) 100 UNIT/ML injection Patient injects 25 units in the AM and 10 units in the PM. Reduce each insulin  dose by 2 units if glucose persistently <100   Lancets (ONETOUCH ULTRASOFT) lancets Check blood sugar 3 times daily-[id number]   levothyroxine  (SYNTHROID ) 125 MCG tablet TAKE 1 TABLET BY MOUTH DAILY ON AN EMPTY STOMACH WITH ONLY WATER FOR 30 MINUTES AND NO ANTACID MED, CALCIUM  OR MAGNESIUM FOR 4 HOURS. AVOID BIOTIN   polymyxin B 500,000 Units in sodium chloride  irrigation 0.9 % 500 mL Irrigate with 1 Application as directed every 30 (thirty) days. Eye Injection   rosuvastatin  (CRESTOR ) 5 MG tablet Take 1 tablet once weekly as directed for Cholesterol LDL goal less than 70.   zinc gluconate 50 MG tablet Take 50 mg by mouth daily. 3 times weekly   [DISCONTINUED] allopurinol  (ZYLOPRIM ) 300 MG tablet TAKE 1/2 TABLET(150 MG) BY MOUTH DAILY FOR GOUT PREVENTION   No facility-administered medications prior to visit.   Review of Systems  Constitutional: Negative.   HENT: Negative.    Eyes: Negative.   Respiratory: Negative.    Cardiovascular: Negative.   Gastrointestinal: Negative.   Genitourinary: Negative.   Musculoskeletal: Negative.   Skin: Negative.   Neurological: Negative.   Psychiatric/Behavioral: Negative.       Objective:    BP 136/88   Pulse 90   Temp 97.9 F (36.6 C) (Oral)   Ht 5' 3 (1.6 m)   Wt 104.8 kg   SpO2 94%   BMI 40.92 kg/m  BP Readings from Last 3 Encounters:  09/20/24 136/88  06/17/24 134/82  03/18/24 134/80   Wt Readings from Last 3 Encounters:  09/20/24 104.8 kg  06/17/24 105.2 kg  03/18/24 103.9 kg   Physical Exam Constitutional:      Appearance: She is obese.  HENT:     Head: Normocephalic.     Right Ear: Tympanic  membrane, ear canal and external ear normal.     Left Ear: Tympanic membrane, ear canal and external ear normal.     Nose: Nose normal.     Mouth/Throat:     Mouth: Mucous membranes are moist.     Pharynx: Oropharynx is clear.  Eyes:     Extraocular Movements: Extraocular movements intact.     Conjunctiva/sclera: Conjunctivae normal.     Pupils: Pupils are equal, round, and reactive to light.  Cardiovascular:     Rate and Rhythm: Normal rate and regular rhythm.     Pulses: Normal pulses.          Dorsalis pedis pulses are 2+ on the right side and 2+ on the left side.  Posterior tibial pulses are 2+ on the right side and 2+ on the left side.     Heart sounds: Normal heart sounds.  Pulmonary:     Effort: Pulmonary effort is normal.     Breath sounds: Normal breath sounds.  Abdominal:     Palpations: Abdomen is soft.     Tenderness: There is no abdominal tenderness.  Musculoskeletal:     Cervical back: Normal range of motion.  Feet:     Right foot:     Protective Sensation: 10 sites tested.  10 sites sensed.     Skin integrity: Skin integrity normal.     Toenail Condition: Right toenails are abnormally thick and long.     Left foot:     Protective Sensation: 10 sites tested.  10 sites sensed.     Skin integrity: Skin integrity normal.     Toenail Condition: Left toenails are abnormally thick and long.     Comments: Left foot ankle appears swollen. Patient states this is a chronic finding. Skin:    General: Skin is warm and dry.  Neurological:     General: No focal deficit present.     Mental Status: She is alert and oriented to person, place, and time. Mental status is at baseline.  Psychiatric:        Mood and Affect: Mood normal.        Behavior: Behavior normal.        Thought Content: Thought content normal.        Judgment: Judgment normal.      Assessment & Plan:    Routine Health Maintenance and Physical Exam Immunization History  Administered Date(s)  Administered   DT (Pediatric) 06/19/2015   DTaP 12/09/2004   PFIZER Comirnaty(Gray Top)Covid-19 Tri-Sucrose Vaccine 04/18/2021   PFIZER(Purple Top)SARS-COV-2 Vaccination 02/03/2020, 02/28/2020, 10/09/2020   Pfizer Covid-19 Vaccine Bivalent Booster 45yrs & up 02/17/2022   Pneumococcal Conjugate-13 12/19/2014   Pneumococcal Polysaccharide-23 03/02/2009, 01/17/2019   Health Maintenance  Topic Date Due   Zoster Vaccines- Shingrix (1 of 2) Never done   FOOT EXAM  03/04/2024   Influenza Vaccine  07/22/2024   COVID-19 Vaccine (6 - 2025-26 season) 08/22/2024   Diabetic kidney evaluation - Urine ACR  09/22/2024   HEMOGLOBIN A1C  12/17/2024   OPHTHALMOLOGY EXAM  12/21/2024   Diabetic kidney evaluation - eGFR measurement  06/17/2025   DTaP/Tdap/Td (3 - Tdap) 06/18/2025   Medicare Annual Wellness (AWV)  07/12/2025   Pneumococcal Vaccine: 50+ Years  Completed   DEXA SCAN  Completed   Hepatitis C Screening  Completed   HPV VACCINES  Aged Out   Meningococcal B Vaccine  Aged Out   Mammogram  Discontinued   Colonoscopy  Discontinued   Foot exam: Completed today. Influenza: Denies. Covid: Can obtain at local pharmacy. Zoster: Denies.  Discussed health benefits of physical activity, and encouraged her to engage in regular exercise appropriate for her age and condition.  Physical exam, annual  Essential hypertension -     Comprehensive metabolic panel with GFR  Hypothyroidism, unspecified type -     TSH  Hyperlipidemia associated with type 2 diabetes mellitus (HCC) -     Lipid panel -     Comprehensive metabolic panel with GFR  Type 2 diabetes mellitus with stage 3b chronic kidney disease, with long-term current use of insulin  (HCC) -     Microalbumin / creatinine urine ratio -     Comprehensive metabolic panel with GFR -  Hemoglobin A1c  Hemoglobin low -     CBC with Differential/Platelet  Idiopathic gout, unspecified chronicity, unspecified site -     Allopurinol ; TAKE 1/2  TABLET(150 MG) BY MOUTH DAILY FOR GOUT PREVENTION  Dispense: 45 tablet; Refill: 2   -Completed a Physical Exam. -Continue all medications. -Diabetic foot exam performed. -Refill Allopurinol  sent to pharmacy. Recommend to monitor blood pressure at home. Please check and record blood pressures twice daily (in the morning, and in the evening) and as needed. If consistently above 130/80, follow up sooner. -Ordered labs: Microalbumin urine, CBC, CMP, lipid, A1c, TSH, Iron panel. Please follow up in 3 months.   Morna Kipper, RN

## 2024-09-20 NOTE — Patient Instructions (Addendum)
 It was nice to see you today! Today we: -Completed a Physical Exam. -Continue all medications. -Diabetic foot exam performed. -Refill Allopurinol  sent to pharmacy. -Recommend to monitor blood pressure at home. Please check and record blood pressures twice daily (in the morning, and in the evening) and as needed. If consistently above 130/80, follow up sooner. -Ordered labs: Microalbumin urine, CBC, CMP, lipid, A1c, TSH, Iron panel. Please follow up in 3 months.

## 2024-09-21 ENCOUNTER — Ambulatory Visit: Payer: Self-pay | Admitting: Family Medicine

## 2024-09-21 DIAGNOSIS — D649 Anemia, unspecified: Secondary | ICD-10-CM

## 2024-09-22 ENCOUNTER — Ambulatory Visit

## 2024-09-22 ENCOUNTER — Encounter: Payer: Medicare Other | Admitting: Nurse Practitioner

## 2024-09-22 DIAGNOSIS — D649 Anemia, unspecified: Secondary | ICD-10-CM

## 2024-09-22 LAB — FERRITIN: Ferritin: 99.8 ng/mL (ref 10.0–291.0)

## 2024-09-22 LAB — IRON: Iron: 58 ug/dL (ref 42–145)

## 2024-09-23 ENCOUNTER — Ambulatory Visit: Payer: Self-pay | Admitting: Family Medicine

## 2024-10-03 ENCOUNTER — Encounter (INDEPENDENT_AMBULATORY_CARE_PROVIDER_SITE_OTHER): Admitting: Ophthalmology

## 2024-10-03 DIAGNOSIS — H34812 Central retinal vein occlusion, left eye, with macular edema: Secondary | ICD-10-CM | POA: Diagnosis not present

## 2024-10-03 DIAGNOSIS — H43813 Vitreous degeneration, bilateral: Secondary | ICD-10-CM

## 2024-10-03 DIAGNOSIS — I10 Essential (primary) hypertension: Secondary | ICD-10-CM

## 2024-10-03 DIAGNOSIS — H35033 Hypertensive retinopathy, bilateral: Secondary | ICD-10-CM | POA: Diagnosis not present

## 2024-10-12 ENCOUNTER — Encounter: Payer: Medicare Other | Admitting: Nurse Practitioner

## 2024-10-31 ENCOUNTER — Encounter (INDEPENDENT_AMBULATORY_CARE_PROVIDER_SITE_OTHER): Admitting: Ophthalmology

## 2024-10-31 DIAGNOSIS — H34812 Central retinal vein occlusion, left eye, with macular edema: Secondary | ICD-10-CM

## 2024-10-31 DIAGNOSIS — H43813 Vitreous degeneration, bilateral: Secondary | ICD-10-CM

## 2024-10-31 DIAGNOSIS — H35033 Hypertensive retinopathy, bilateral: Secondary | ICD-10-CM

## 2024-10-31 DIAGNOSIS — I10 Essential (primary) hypertension: Secondary | ICD-10-CM | POA: Diagnosis not present

## 2024-11-09 ENCOUNTER — Ambulatory Visit: Payer: Self-pay | Admitting: Family Medicine

## 2024-11-09 LAB — OPHTHALMOLOGY REPORT-SCANNED

## 2024-11-14 NOTE — Progress Notes (Unsigned)
 PATIENT: Jocelyn Camacho DOB: 06/05/1946  REASON FOR VISIT: follow up HISTORY FROM: patient  No chief complaint on file.    HISTORY OF PRESENT ILLNESS:  11/14/24 ALL:  Jocelyn Camacho returns for follow up for OSA on CPAP.     11/11/2023 ALL:  Jocelyn Camacho returns for follow up for OSA on CPAP. She continues to do well on therapy. She is using therapy most nights for about 4.5 hours, on average. She admits that she has not been as compliant, recently. She was assisting her daughter who recently had knee replacement surgery.     11/18/2022 ALL:  Jocelyn Camacho returns for follow up for OSA on CPAP. She is doing well. She is using her machine for about 4-5 hours. She is concerned that the machine may not be recording accurately. She has set alarms for 4 hours and the machine has recorded less time. She usually sleeps for about 5 hours. She is tolerating CPAP well. She is not certain she sleeps any better.     04/23/2022 ALL: Jocelyn Camacho is a 78 y.o. female here today for follow up for OSA on CPAP.  She was seen in consult with Dr Jocelyn 10/24/2021 for sleep study to resume CPAP. She had previously used CPAP but been off therapy for about a year due to needing a new machine. PSG 12/03/2021 showed moderate/severe OSA with total AHI 27.4/hr with severe hypoxia with O2 nadir 83% , O2 below 89 for 64 minutes. CPAP titration study 01/15/2022 showed OSA treated well at 8cmH20 and hypoxia resolved. AutoPAP ordered. Since, she reports using CPAP most nights. She reports having long standing issue with insomnia. She is taking melatonin 10mg  daily. She hasn't been interested in adding more medications. She has not noticed much benefit of using CPAP. She is followed closely by PCP.     HISTORY: (copied from Dohmeier's previous note)  Jocelyn Camacho is a 78 y.o. year old Black or African American female patient seen here as a referral on 10/24/2021 from Dr. Tonita for a new evaluation of OSA. SABRA  Chief concern according to  patient :  I had a sleep study on [address], @[facility], many years ago- and need a new CPAP. I have not been using my machine for a year now , it wasn't working as well as I thought it should I was last tested and treated over 10 years ago. .    I have the pleasure of seeing Jocelyn Camacho today, a right-handed Black or African American female with a history of OSA sleep disorder.  She has a past medical history of Allergic rhinitis, Allergy, Anxiety, Hyperlipidemia, Hypertension, Obesity, OSA (obstructive sleep apnea), Type II or unspecified type diabetes mellitus with renal manifestations, not stated as uncontrolled(250.40), Unspecified hypothyroidism, and Vitamin D  deficiency..     Sleep relevant medical history: now not on CPAP : Nocturia 2 times , no Tonsillectomy, no cervical spine surgery, no deviated septum . Knee arthritis. DM on insulin .     Family medical /sleep history: no  other family member on CPAP with OSA,.    Social history:  Patient is retired from [employer] and lives in a household with spouse,  adult daughter lives nearby. No pets.Tobacco use/ .  ETOH use /,  Caffeine intake in form of Coffee( 1-2 a week) Soda( /) Tea ( /) or energy drinks. Regular exercise in form of -nothing, not a swimmer.    Sleep habits are as follows: The patient's dinner  time is between 6 PM. The patient goes to sleep on a love seat in her den- not reclined- seated. Tv is on , lights on all night-  PM and continues to sleep for intervals of 2 -3 hours,  wakes for 2 bathroom breaks, the first time at 3 AM.   The preferred sleep position is seated, with the support of one pillow.  Dreams are reportedly rare.  7  AM is the usual rise time. The patient wakes up spontaneously.  She reports not feeling unrefreshed or unrestored in AM, with symptoms such as dry mouth, morning headaches, and residual fatigue.  Naps are taken infrequently, lasting from 15 to 20 minutes and are refreshing .   REVIEW OF  SYSTEMS: Out of a complete 14 system review of symptoms, the patient complains only of the following symptoms, insomnia, fatigue, and all other reviewed systems are negative.  ESS: 10/24, previously 3/24   ALLERGIES: Allergies  Allergen Reactions   Penicillins Swelling   Sulfa  Antibiotics Itching and Rash    HOME MEDICATIONS: Outpatient Medications Prior to Visit  Medication Sig Dispense Refill   acetaminophen  (TYLENOL ) 650 MG CR tablet Take 1,300 mg by mouth every 8 (eight) hours as needed for pain.     allopurinol  (ZYLOPRIM ) 300 MG tablet TAKE 1/2 TABLET(150 MG) BY MOUTH DAILY FOR GOUT PREVENTION 45 tablet 2   Ascorbic Acid (VITAMIN C) 1000 MG tablet Take 1,000 mg by mouth daily.     aspirin  EC 81 MG tablet Take 81 mg by mouth daily.     atenolol  (TENORMIN ) 100 MG tablet Take 1 tablet by mouth once daily for blood pressure 90 tablet 2   Cholecalciferol (VITAMIN D3) 5000 units CAPS Take 5,000 Units by mouth daily.     ciprofloxacin  (CILOXAN ) 0.3 % ophthalmic solution      colchicine  0.6 MG tablet Take 1 tablet daily for prevention of Gout 90 tablet 3   Cyanocobalamin (B-12) 1000 MCG SUBL Place 1,000 mcg under the tongue 2 (two) times a week. Saturday and Sunday     dorzolamide -timolol  (COSOPT ) 22.3-6.8 MG/ML ophthalmic solution Place 1 drop into the left eye 2 (two) times daily.     Dulaglutide  (TRULICITY ) 3 MG/0.5ML SOAJ INJECT 3 MG  SUBCUTANEOUSLY ONCE A WEEK FOR DIABETES 12 mL 3   Finerenone  (KERENDIA ) 10 MG TABS Take 10 mg by mouth daily. Take  1 tablet  Daily  for Diabetic Kidneys (Patient taking differently: Take 10 mg by mouth daily. Diabetic Kidneys) 90 tablet 0   furosemide  (LASIX ) 80 MG tablet TAKE 1 TABLET BY MOUTH 1 TO 2 TIMES PER DAY AS NEEDED FOR FLUID RETENTION OR ANKLE SWELLING 180 tablet 3   glucose blood (ONETOUCH ULTRA) test strip 1 each by Other route in the morning, at noon, and at bedtime. Use as instructed 300 strip 3   hyoscyamine  (LEVSIN , ANASPAZ ) 0.125 MG  tablet Take 1 tablet (0.125 mg total) by mouth every 6 (six) hours as needed. 60 tablet 1   insulin  NPH-regular Human (70-30) 100 UNIT/ML injection Patient injects 25 units in the AM and 10 units in the PM. Reduce each insulin  dose by 2 units if glucose persistently <100 10 mL 3   Lancets (ONETOUCH ULTRASOFT) lancets Check blood sugar 3 times daily-[id number] 300 each 1   levothyroxine  (SYNTHROID ) 125 MCG tablet TAKE 1 TABLET BY MOUTH DAILY ON AN EMPTY STOMACH WITH ONLY WATER FOR 30 MINUTES AND NO ANTACID MED, CALCIUM  OR MAGNESIUM FOR 4 HOURS. AVOID BIOTIN 90  tablet 3   polymyxin B 500,000 Units in sodium chloride  irrigation 0.9 % 500 mL Irrigate with 1 Application as directed every 30 (thirty) days. Eye Injection     rosuvastatin  (CRESTOR ) 5 MG tablet Take 1 tablet once weekly as directed for Cholesterol LDL goal less than 70. 13 tablet 3   zinc gluconate 50 MG tablet Take 50 mg by mouth daily. 3 times weekly     No facility-administered medications prior to visit.    PAST MEDICAL HISTORY: Past Medical History:  Diagnosis Date   Allergic rhinitis    Allergy    Anxiety    with medical procedures   Gout    Hyperlipidemia    Hypertension    Obesity    OSA (obstructive sleep apnea)    wears CPAP   Type II or unspecified type diabetes mellitus with renal manifestations, not stated as uncontrolled(250.40)    Unspecified hypothyroidism    Vitamin D  deficiency    Vitiligo     PAST SURGICAL HISTORY: Past Surgical History:  Procedure Laterality Date    eye procedure     have treatment where a needle is stuck into left eye- since June 2014; now every six weeks   BOWEL RESECTION N/A 07/24/2022   Procedure: SMALL BOWEL RESECTION;  Surgeon: Rubin Calamity, MD;  Location: [facility] OR;  Service: General;  Laterality: N/A;   DENTAL EXAMINATION UNDER ANESTHESIA     ENDOVENOUS ABLATION SAPHENOUS VEIN W/ LASER Left 12/31/2022   endovenous laser ablation left SSV by Medford Blade MD   LAPAROSCOPY N/A  07/24/2022   Procedure: LAPAROSCOPY DIAGNOSTIC;  Surgeon: Rubin Calamity, MD;  Location[facility] OR;  Service: General;  Laterality: N/A;   LAPAROTOMY N/A 07/24/2022   Procedure: EXPLORATORY LAPAROTOMY;  Surgeon: Rubin Calamity, MD;  Location: [facility] OR;  Service: General;  Laterality: N/A;   TUBAL LIGATION     UMBILICAL HERNIA REPAIR N/A 07/24/2022   Procedure: PRIMARY UMBILICAL HERNIA REPAIR;  Surgeon: Rubin Calamity, MD;  Location: [facility] OR;  Service: General;  Laterality: N/A;    FAMILY HISTORY: Family History  Problem Relation Age of Onset   Breast cancer Mother    Renal Disease Mother    Hypertension Sister    Diabetes Sister    Diabetes Maternal Grandmother    Colon cancer Neg Hx    Esophageal cancer Neg Hx    Stomach cancer Neg Hx    Rectal cancer Neg Hx     SOCIAL HISTORY: Social History   Socioeconomic History   Marital status: Married    Spouse name: Charles   Number of children: 1   Years of education: Not on file   Highest education level: Some college, no degree  Occupational History   Occupation: Retired  Tobacco Use   Smoking status: Never   Smokeless tobacco: Never  Vaping Use   Vaping status: Never Used  Substance and Sexual Activity   Alcohol use: Not Currently    Comment: very rarely   Drug use: No   Sexual activity: Not Currently    Partners: Male    Birth control/protection: Post-menopausal  Other Topics Concern   Not on file  Social History Narrative   Lives with husband    Right handed   Caffeine: 1 cup of coffee every 2 weeks   Social Drivers of Corporate Investment Banker Strain: Low Risk  (03/18/2024)   Overall Financial Resource Strain (CARDIA)    Difficulty of Paying Living Expenses: Not hard at all  Food Insecurity: No Food Insecurity (03/18/2024)   Hunger Vital Sign    Worried About Running Out of Food in the Last Year: Never true    Ran Out of Food in the Last Year: Never true  Transportation Needs: No Transportation Needs  (03/18/2024)   PRAPARE - Administrator, Civil Service (Medical): No    Lack of Transportation (Non-Medical): No  Physical Activity: Insufficiently Active (03/18/2024)   Exercise Vital Sign    Days of Exercise per Week: 1 day    Minutes of Exercise per Session: 10 min  Stress: No Stress Concern Present (03/18/2024)   [employer] of Occupational Health - Occupational Stress Questionnaire    Feeling of Stress : Only a little  Social Connections: Socially Integrated (03/18/2024)   Social Connection and Isolation Panel    Frequency of Communication with Friends and Family: More than three times a week    Frequency of Social Gatherings with Friends and Family: Once a week    Attends Religious Services: 1 to 4 times per year    Active Member of [employer] or Organizations: Yes    Attends Banker Meetings: 1 to 4 times per year    Marital Status: Married  Catering Manager Violence: Not At Risk (03/18/2024)   Humiliation, Afraid, Rape, and Kick questionnaire    Fear of Current or Ex-Partner: No    Emotionally Abused: No    Physically Abused: No    Sexually Abused: No     PHYSICAL EXAM  There were no vitals filed for this visit.    There is no height or weight on file to calculate BMI.  Generalized: Well developed, in no acute distress  Cardiology: normal rate and rhythm, no murmur noted Respiratory: clear to auscultation bilaterally  Neurological examination  Mentation: Alert oriented to time, place, history taking. Follows all commands speech and language fluent Cranial nerve II-XII: Pupils were equal round reactive to light. Extraocular movements were full, visual field were full  Motor: The motor testing reveals 5 over 5 strength of all 4 extremities. Good symmetric motor tone is noted throughout.  Gait and station: Gait is wide, stable with cane.     DIAGNOSTIC DATA (LABS, IMAGING, TESTING) - I reviewed patient records, labs, notes, testing and imaging  myself where available.      No data to display           Lab Results  Component Value Date   WBC 7.3 09/20/2024   HGB 11.4 (L) 09/20/2024   HCT 35.1 (L) 09/20/2024   MCV 87.4 09/20/2024   PLT 266.0 09/20/2024      Component Value Date/Time   NA 139 09/20/2024 1026   K 3.9 09/20/2024 1026   CL 101 09/20/2024 1026   CO2 31 09/20/2024 1026   GLUCOSE 116 (H) 09/20/2024 1026   BUN 19 09/20/2024 1026   CREATININE 1.25 (H) 09/20/2024 1026   CREATININE 1.46 (H) 12/31/2023 1529   CALCIUM  9.3 09/20/2024 1026   PROT 7.1 09/20/2024 1026   ALBUMIN  4.0 09/20/2024 1026   AST 19 09/20/2024 1026   ALT 13 09/20/2024 1026   ALKPHOS 102 09/20/2024 1026   BILITOT 0.8 09/20/2024 1026   GFRNONAA 34 (L) 09/09/2022 1216   GFRNONAA 38 (L) 03/04/2021 1110   GFRAA 44 (L) 03/04/2021 1110   Lab Results  Component Value Date   CHOL 109 09/20/2024   HDL 33.80 (L) 09/20/2024   LDLCALC 42 09/20/2024   TRIG 168.0 (H)  09/20/2024   CHOLHDL 3 09/20/2024   Lab Results  Component Value Date   HGBA1C 5.5 09/20/2024   Lab Results  Component Value Date   VITAMINB12 1,548 (H) 08/08/2021   Lab Results  Component Value Date   TSH 1.58 09/20/2024     ASSESSMENT AND PLAN 78 y.o. year old female  has a past medical history of Allergic rhinitis, Allergy, Anxiety, Gout, Hyperlipidemia, Hypertension, Obesity, OSA (obstructive sleep apnea), Type II or unspecified type diabetes mellitus with renal manifestations, not stated as uncontrolled(250.40), Unspecified hypothyroidism, Vitamin D  deficiency, and Vitiligo. here with   No diagnosis found.   JAYLIA PETTUS is doing well on CPAP therapy. Compliance report reveals sub optimal compliance. She was previously doing well prior to daughter's surgery. She was encouraged to continue using CPAP nightly and for greater than 4 hours each night. Risks of untreated sleep apnea review and education materials provided. Healthy lifestyle habits encouraged. She will  follow up in 1 year, sooner if needed. She verbalizes understanding and agreement with this plan.    No orders of the defined types were placed in this encounter.    No orders of the defined types were placed in this encounter.     Greig Forbes, FNP-C 11/14/2024, 11:33 AM [facility] [address] [place], [place] [address] [phone]

## 2024-11-15 NOTE — Progress Notes (Unsigned)
 Jocelyn Camacho

## 2024-11-16 ENCOUNTER — Encounter: Payer: Self-pay | Admitting: Family Medicine

## 2024-11-16 ENCOUNTER — Ambulatory Visit: Payer: Medicare Other | Admitting: Family Medicine

## 2024-11-16 VITALS — BP 118/72 | HR 82 | Ht 64.0 in | Wt 230.6 lb

## 2024-11-16 DIAGNOSIS — G4733 Obstructive sleep apnea (adult) (pediatric): Secondary | ICD-10-CM | POA: Diagnosis not present

## 2024-11-16 NOTE — Patient Instructions (Signed)

## 2024-11-28 ENCOUNTER — Encounter (INDEPENDENT_AMBULATORY_CARE_PROVIDER_SITE_OTHER): Admitting: Ophthalmology

## 2024-11-28 DIAGNOSIS — H34812 Central retinal vein occlusion, left eye, with macular edema: Secondary | ICD-10-CM | POA: Diagnosis not present

## 2024-11-28 DIAGNOSIS — I10 Essential (primary) hypertension: Secondary | ICD-10-CM | POA: Diagnosis not present

## 2024-11-28 DIAGNOSIS — H35033 Hypertensive retinopathy, bilateral: Secondary | ICD-10-CM | POA: Diagnosis not present

## 2024-11-28 DIAGNOSIS — H43813 Vitreous degeneration, bilateral: Secondary | ICD-10-CM

## 2024-11-29 ENCOUNTER — Other Ambulatory Visit: Payer: Self-pay | Admitting: Nurse Practitioner

## 2024-11-29 DIAGNOSIS — E1169 Type 2 diabetes mellitus with other specified complication: Secondary | ICD-10-CM

## 2024-11-30 ENCOUNTER — Telehealth: Payer: Self-pay

## 2024-11-30 NOTE — Telephone Encounter (Signed)
 RN contacted patient regarding her being overdue for her next colonoscopy. Patient stated that her PCP told her she does not need to have anymore colonoscopies.

## 2024-12-05 ENCOUNTER — Telehealth: Payer: Self-pay | Admitting: Family Medicine

## 2024-12-05 ENCOUNTER — Other Ambulatory Visit: Payer: Self-pay | Admitting: Nurse Practitioner

## 2024-12-05 DIAGNOSIS — E039 Hypothyroidism, unspecified: Secondary | ICD-10-CM

## 2024-12-05 DIAGNOSIS — I872 Venous insufficiency (chronic) (peripheral): Secondary | ICD-10-CM

## 2024-12-05 NOTE — Telephone Encounter (Unsigned)
 Copied from CRM #[id number]. Topic: Clinical - Medication Refill >> Dec 05, 2024  2:52 PM Jasmin G wrote: Medication: levothyroxine  (SYNTHROID ) 125 MCG tablet  Has the patient contacted their pharmacy? No (Agent: If no, request that the patient contact the pharmacy for the refill. If patient does not wish to contact the pharmacy document the reason why and proceed with request.) (Agent: If yes, when and what did the pharmacy advise?)  This is the patient's preferred pharmacy:  [facility] [phone] - [place], [place] - [address] AT [facility] [address] [place] [address] Phone: [phone] Fax: [phone]  Is this the correct pharmacy for this prescription? Yes If no, delete pharmacy and type the correct one.   Has the prescription been filled recently? Yes  Is the patient out of the medication? No  Has the patient been seen for an appointment in the last year OR does the patient have an upcoming appointment? Yes  Can we respond through MyChart? No  Agent: Please be advised that Rx refills may take up to 3 business days. We ask that you follow-up with your pharmacy.

## 2024-12-06 MED ORDER — LEVOTHYROXINE SODIUM 125 MCG PO TABS: ORAL_TABLET | ORAL | 3 refills | Status: AC

## 2024-12-07 ENCOUNTER — Other Ambulatory Visit: Payer: Self-pay | Admitting: Family Medicine

## 2024-12-07 DIAGNOSIS — N1832 Chronic kidney disease, stage 3b: Secondary | ICD-10-CM

## 2024-12-12 ENCOUNTER — Other Ambulatory Visit: Payer: Self-pay | Admitting: Nurse Practitioner

## 2024-12-12 DIAGNOSIS — I10 Essential (primary) hypertension: Secondary | ICD-10-CM

## 2024-12-20 ENCOUNTER — Encounter: Payer: Self-pay | Admitting: Family Medicine

## 2024-12-20 ENCOUNTER — Ambulatory Visit (INDEPENDENT_AMBULATORY_CARE_PROVIDER_SITE_OTHER): Admitting: Family Medicine

## 2024-12-20 ENCOUNTER — Ambulatory Visit: Payer: Self-pay | Admitting: Family Medicine

## 2024-12-20 VITALS — BP 132/88 | HR 100 | Temp 98.6°F | Ht 64.0 in | Wt 226.0 lb

## 2024-12-20 DIAGNOSIS — N1832 Chronic kidney disease, stage 3b: Secondary | ICD-10-CM | POA: Diagnosis not present

## 2024-12-20 DIAGNOSIS — D649 Anemia, unspecified: Secondary | ICD-10-CM

## 2024-12-20 DIAGNOSIS — E1122 Type 2 diabetes mellitus with diabetic chronic kidney disease: Secondary | ICD-10-CM

## 2024-12-20 DIAGNOSIS — I10 Essential (primary) hypertension: Secondary | ICD-10-CM | POA: Diagnosis not present

## 2024-12-20 DIAGNOSIS — B372 Candidiasis of skin and nail: Secondary | ICD-10-CM | POA: Diagnosis not present

## 2024-12-20 DIAGNOSIS — Z794 Long term (current) use of insulin: Secondary | ICD-10-CM

## 2024-12-20 DIAGNOSIS — N183 Chronic kidney disease, stage 3 unspecified: Secondary | ICD-10-CM | POA: Diagnosis not present

## 2024-12-20 DIAGNOSIS — E785 Hyperlipidemia, unspecified: Secondary | ICD-10-CM

## 2024-12-20 DIAGNOSIS — E1169 Type 2 diabetes mellitus with other specified complication: Secondary | ICD-10-CM | POA: Diagnosis not present

## 2024-12-20 DIAGNOSIS — E039 Hypothyroidism, unspecified: Secondary | ICD-10-CM | POA: Diagnosis not present

## 2024-12-20 DIAGNOSIS — M1A9XX0 Chronic gout, unspecified, without tophus (tophi): Secondary | ICD-10-CM

## 2024-12-20 DIAGNOSIS — I878 Other specified disorders of veins: Secondary | ICD-10-CM

## 2024-12-20 LAB — HEMOGLOBIN A1C: Hgb A1c MFr Bld: 5 % (ref 4.6–6.5)

## 2024-12-20 LAB — CBC WITH DIFFERENTIAL/PLATELET
Basophils Absolute: 0.1 K/uL (ref 0.0–0.1)
Basophils Relative: 1.2 % (ref 0.0–3.0)
Eosinophils Absolute: 0.2 K/uL (ref 0.0–0.7)
Eosinophils Relative: 2 % (ref 0.0–5.0)
HCT: 33.3 % — ABNORMAL LOW (ref 36.0–46.0)
Hemoglobin: 11.1 g/dL — ABNORMAL LOW (ref 12.0–15.0)
Lymphocytes Relative: 28.8 % (ref 12.0–46.0)
Lymphs Abs: 2.3 K/uL (ref 0.7–4.0)
MCHC: 33.2 g/dL (ref 30.0–36.0)
MCV: 87 fl (ref 78.0–100.0)
Monocytes Absolute: 0.6 K/uL (ref 0.1–1.0)
Monocytes Relative: 7.3 % (ref 3.0–12.0)
Neutro Abs: 4.8 K/uL (ref 1.4–7.7)
Neutrophils Relative %: 60.7 % (ref 43.0–77.0)
Platelets: 264 K/uL (ref 150.0–400.0)
RBC: 3.83 Mil/uL — ABNORMAL LOW (ref 3.87–5.11)
RDW: 15.6 % — ABNORMAL HIGH (ref 11.5–15.5)
WBC: 7.9 K/uL (ref 4.0–10.5)

## 2024-12-20 LAB — COMPREHENSIVE METABOLIC PANEL WITH GFR
ALT: 12 U/L (ref 3–35)
AST: 18 U/L (ref 5–37)
Albumin: 3.8 g/dL (ref 3.5–5.2)
Alkaline Phosphatase: 91 U/L (ref 39–117)
BUN: 17 mg/dL (ref 6–23)
CO2: 30 meq/L (ref 19–32)
Calcium: 9 mg/dL (ref 8.4–10.5)
Chloride: 106 meq/L (ref 96–112)
Creatinine, Ser: 0.97 mg/dL (ref 0.40–1.20)
GFR: 55.88 mL/min — ABNORMAL LOW
Glucose, Bld: 76 mg/dL (ref 70–99)
Potassium: 3.7 meq/L (ref 3.5–5.1)
Sodium: 143 meq/L (ref 135–145)
Total Bilirubin: 0.9 mg/dL (ref 0.2–1.2)
Total Protein: 6.8 g/dL (ref 6.0–8.3)

## 2024-12-20 LAB — TSH: TSH: 0.36 u[IU]/mL (ref 0.35–5.50)

## 2024-12-20 LAB — LIPID PANEL
Cholesterol: 89 mg/dL (ref 28–200)
HDL: 33.5 mg/dL — ABNORMAL LOW
LDL Cholesterol: 39 mg/dL (ref 10–99)
NonHDL: 55.2
Total CHOL/HDL Ratio: 3
Triglycerides: 80 mg/dL (ref 10.0–149.0)
VLDL: 16 mg/dL (ref 0.0–40.0)

## 2024-12-20 MED ORDER — ATENOLOL 100 MG PO TABS: ORAL_TABLET | ORAL | 2 refills | Status: AC

## 2024-12-20 MED ORDER — NYSTATIN 100000 UNIT/GM EX POWD: 1.0000 | Freq: Three times a day (TID) | CUTANEOUS | 2 refills | Status: AC

## 2024-12-20 NOTE — Assessment & Plan Note (Signed)
 Ordered CMP. Continue taking Finerenone  10mg  daily.

## 2024-12-20 NOTE — Assessment & Plan Note (Signed)
 Stable. Continue Furosemide 80mg , 1-2 times a day. Continue wearing compression stockings.

## 2024-12-20 NOTE — Assessment & Plan Note (Signed)
 Stable. Continue Atenolol  100mg  daily. Refilled medication. Ordered CMP.

## 2024-12-20 NOTE — Assessment & Plan Note (Signed)
 Last A1c was stable. Continue taking Trulicity  3mg  injection and Insulin  NPH-regular Human 70/30, 25 U in the AM and 10 U in the PM. Ophthalmology eye exam UTD. Foot exam UTD. Microalbumin/creatine is UTD. She is on a statin, but no an ACE or ARB. Patient is on Kerendia  for kidney protection with nephrologist. Ordered A1c and CMP.

## 2024-12-20 NOTE — Patient Instructions (Addendum)
-  It was a pleasure to see you today.  -Continue all medications. Refilled Atenolol .  -Prescribed Nystatin  Powder for yeast infection on the skin. Recommend to cleanse the skin with a mild soap, like Dial Antibacterial Soap, pat dry, and apply powder 2-3 times a day as needed.  -Ordered labs. Office will call with lab results and will be available via MyChart.  -Follow up in 6 months for physical.

## 2024-12-20 NOTE — Progress Notes (Signed)
 "  Established Patient Office Visit   Subjective:  Patient ID: Jocelyn Camacho, female    DOB: 1946-06-16  Age: 78 y.o. MRN: [id number]  Chief Complaint  Patient presents with   Medical Management of Chronic Issues    3 month follow up     HPI Gout: Chronic. Patient is prescribed Allopurinol  150mg  daily and Colchicine  0.6mg  daily for prevention. Not had recent flare up.    Hypertension: Chronic. Patient is prescribed Atenolol  100mg  daily. She reports she has been taking this way for a while.  She reports she usually does not monitor her blood pressure at home.  Denies CP, SHOB, HA, dizziness, lightheadedness, or abnormal lower extremity edema.  BP Readings from Last 3 Encounters:  12/20/24 132/88  11/16/24 118/72  09/20/24 136/88    Diabetes: Chronic. Patient is taking Trulicity  3mg  injection every 7 days and Insulin  NPH-regular Human 70/30- take 25 units in the AM and 10 units in the PM.  Also, taking Finerenone  10mg  daily for kidney function.  Lab Results  Component Value Date   HGBA1C 5.0 12/20/2024    Lower extremity edema: Patient is prescribed Furosemide  80mg  tablet, take 1 tablet 1-2 times a day as needed for fluid retention or ankle swelling. Patient reports she takes the medication every day and report very rarely she has to take two tablets. She wears compression stockings to also help.    Hypothyroidism: Chronic. Patient is taking Levothyroxine  125mcg tablet, whole tablet everyday but Wednesday, she takes 1/2 tablet.  Lab Results  Component Value Date   TSH 0.36 12/20/2024    Hyperlipidemia: Chronic. Patient is prescribed Rosuvastatin  5mg  once a week. Effective. Lab Results  Component Value Date   CHOL 89 12/20/2024   HDL 33.50 (L) 12/20/2024   LDLCALC 39 12/20/2024   TRIG 80.0 12/20/2024   CHOLHDL 3 12/20/2024    Patient is complaining of a rash underneath her breast and groin where skin becomes moist and touches. She reports previously being prescribed a cream and  powder to help with rash.  ROS See HPI above     Objective:   BP 132/88   Pulse 100   Temp 98.6 F (37 C) (Oral)   Ht 5' 4 (1.626 m)   Wt 226 lb (102.5 kg)   SpO2 98%   BMI 38.79 kg/m    Physical Exam Vitals reviewed.  Constitutional:      General: She is not in acute distress.    Appearance: Normal appearance. She is obese. She is not ill-appearing, toxic-appearing or diaphoretic.  HENT:     Head: Normocephalic and atraumatic.  Eyes:     General:        Right eye: No discharge.        Left eye: No discharge.     Conjunctiva/sclera: Conjunctivae normal.  Cardiovascular:     Rate and Rhythm: Normal rate and regular rhythm.     Heart sounds: Normal heart sounds. No murmur heard.    No friction rub. No gallop.     Comments: Wearing compression stockings.  Pulmonary:     Effort: Pulmonary effort is normal. No respiratory distress.     Breath sounds: Normal breath sounds.  Musculoskeletal:        General: Normal range of motion.     Right lower leg: Edema present.     Left lower leg: Edema present.  Skin:    General: Skin is warm and dry.  Neurological:     General: No focal  deficit present.     Mental Status: She is alert and oriented to person, place, and time. Mental status is at baseline.     Gait: Gait abnormal (Wheelchair).  Psychiatric:        Mood and Affect: Mood normal.        Behavior: Behavior normal.        Thought Content: Thought content normal.        Judgment: Judgment normal.     Results for orders placed or performed in visit on 12/20/24  CBC with Differential/Platelet  Result Value Ref Range   WBC 7.9 4.0 - 10.5 K/uL   RBC 3.83 (L) 3.87 - 5.11 Mil/uL   Hemoglobin 11.1 (L) 12.0 - 15.0 g/dL   HCT 66.6 (L) 63.9 - 53.9 %   MCV 87.0 78.0 - 100.0 fl   MCHC 33.2 30.0 - 36.0 g/dL   RDW 84.3 (H) 88.4 - 84.4 %   Platelets 264.0 150.0 - 400.0 K/uL   Neutrophils Relative % 60.7 43.0 - 77.0 %   Lymphocytes Relative 28.8 12.0 - 46.0 %   Monocytes  Relative 7.3 3.0 - 12.0 %   Eosinophils Relative 2.0 0.0 - 5.0 %   Basophils Relative 1.2 0.0 - 3.0 %   Neutro Abs 4.8 1.4 - 7.7 K/uL   Lymphs Abs 2.3 0.7 - 4.0 K/uL   Monocytes Absolute 0.6 0.1 - 1.0 K/uL   Eosinophils Absolute 0.2 0.0 - 0.7 K/uL   Basophils Absolute 0.1 0.0 - 0.1 K/uL  Comprehensive metabolic panel with GFR  Result Value Ref Range   Sodium 143 135 - 145 mEq/L   Potassium 3.7 3.5 - 5.1 mEq/L   Chloride 106 96 - 112 mEq/L   CO2 30 19 - 32 mEq/L   Glucose, Bld 76 70 - 99 mg/dL   BUN 17 6 - 23 mg/dL   Creatinine, Ser 9.02 0.40 - 1.20 mg/dL   Total Bilirubin 0.9 0.2 - 1.2 mg/dL   Alkaline Phosphatase 91 39 - 117 U/L   AST 18 5 - 37 U/L   ALT 12 3 - 35 U/L   Total Protein 6.8 6.0 - 8.3 g/dL   Albumin  3.8 3.5 - 5.2 g/dL   GFR 44.11 (L) >39.99 mL/min   Calcium  9.0 8.4 - 10.5 mg/dL  Hemoglobin J8r  Result Value Ref Range   Hgb A1c MFr Bld 5.0 4.6 - 6.5 %  Lipid panel  Result Value Ref Range   Cholesterol 89 28 - 200 mg/dL   Triglycerides 19.9 89.9 - 149.0 mg/dL   HDL 66.49 (L) >60.99 mg/dL   VLDL 83.9 0.0 - 59.9 mg/dL   LDL Cholesterol 39 10 - 99 mg/dL   Total CHOL/HDL Ratio 3    NonHDL 55.20   TSH  Result Value Ref Range   TSH 0.36 0.35 - 5.50 uIU/mL      Assessment & Plan:  Type 2 diabetes mellitus with stage 3b chronic kidney disease, with long-term current use of insulin  (HCC) Assessment & Plan: Last A1c was stable. Continue taking Trulicity  3mg  injection and Insulin  NPH-regular Human 70/30, 25 U in the AM and 10 U in the PM. Ophthalmology eye exam UTD. Foot exam UTD. Microalbumin/creatine is UTD. She is on a statin, but no an ACE or ARB. Patient is on Kerendia  for kidney protection with nephrologist. Ordered A1c and CMP.      Orders: -     Comprehensive metabolic panel with GFR -     Hemoglobin A1c  Essential hypertension Assessment & Plan: Stable. Continue Atenolol  100mg  daily. Refilled medication. Ordered CMP.   Orders: -     Atenolol ; Take 1  tablet by mouth once daily for blood pressure  Dispense: 90 tablet; Refill: 2 -     Comprehensive metabolic panel with GFR  Hypothyroidism, unspecified type Assessment & Plan: Stable. Last TSH was normal in January. Continue Levothyroxine  125mcg daily, except Wednesday, 1/2 tablet. Ordered TSH.     Orders: -     TSH  CKD stage 3 due to type 2 diabetes mellitus (HCC) Assessment & Plan: Ordered CMP. Continue taking Finerenone  10mg  daily.   Orders: -     Comprehensive metabolic panel with GFR  Hyperlipidemia associated with type 2 diabetes mellitus (HCC) Assessment & Plan: Stable. Continue Rosuvastatin  5mg  tablet once a week. Ordered CMP and lipid panel.   Orders: -     Comprehensive metabolic panel with GFR -     Lipid panel  Low hemoglobin -     CBC with Differential/Platelet  Yeast infection of the skin -     Nystatin ; Apply 1 Application topically 3 (three) times daily.  Dispense: 60 g; Refill: 2  Chronic gout without tophus, unspecified cause, unspecified site Assessment & Plan: Stable. Continue with Allopurinol  150mg  daily and Colchicine  0.6mg  daily for prevention of a flare up. Denies recent flare up.    Venous stasis of both lower extremities Assessment & Plan: Stable. Continue Furosemide  80mg , 1-2 times a day. Continue wearing compression stockings.    1.Review health maintenance:  -Influenza vaccine: Declines -Covid vaccine: Will obtain at local pharmacy  -Zoster vaccine: Declines  2.Prescribed Nystatin  Powder for yeast infection on the skin. Recommend to cleanse the skin with a mild soap, like Dial Antibacterial Soap, pat dry, and apply powder 2-3 times a day as needed.  3.Ordered CBC for previously low hemoglobin levels.  Return in about 6 months (around 06/20/2025) for physical.   Deantae Shackleton, NP "

## 2024-12-20 NOTE — Assessment & Plan Note (Signed)
 Stable. Continue Rosuvastatin  5mg  tablet once a week. Ordered CMP and lipid panel.

## 2024-12-20 NOTE — Assessment & Plan Note (Signed)
 Stable. Continue with Allopurinol  150mg  daily and Colchicine  0.6mg  daily for prevention of a flare up. Denies recent flare up.

## 2024-12-20 NOTE — Assessment & Plan Note (Signed)
 Stable. Last TSH was normal in January. Continue Levothyroxine  125mcg daily, except Wednesday, 1/2 tablet. Ordered TSH.

## 2024-12-21 ENCOUNTER — Ambulatory Visit: Payer: Self-pay | Admitting: Family Medicine

## 2024-12-21 ENCOUNTER — Ambulatory Visit (INDEPENDENT_AMBULATORY_CARE_PROVIDER_SITE_OTHER)

## 2024-12-21 DIAGNOSIS — D649 Anemia, unspecified: Secondary | ICD-10-CM | POA: Diagnosis not present

## 2024-12-21 LAB — IRON: Iron: 61 ug/dL (ref 42–145)

## 2024-12-21 LAB — FERRITIN: Ferritin: 121.3 ng/mL (ref 10.0–291.0)

## 2024-12-26 ENCOUNTER — Encounter (INDEPENDENT_AMBULATORY_CARE_PROVIDER_SITE_OTHER): Admitting: Ophthalmology

## 2024-12-26 DIAGNOSIS — I10 Essential (primary) hypertension: Secondary | ICD-10-CM

## 2024-12-26 DIAGNOSIS — H43813 Vitreous degeneration, bilateral: Secondary | ICD-10-CM

## 2024-12-26 DIAGNOSIS — H35033 Hypertensive retinopathy, bilateral: Secondary | ICD-10-CM | POA: Diagnosis not present

## 2024-12-26 DIAGNOSIS — H34812 Central retinal vein occlusion, left eye, with macular edema: Secondary | ICD-10-CM | POA: Diagnosis not present

## 2025-01-23 ENCOUNTER — Encounter (INDEPENDENT_AMBULATORY_CARE_PROVIDER_SITE_OTHER): Admitting: Ophthalmology

## 2025-02-01 ENCOUNTER — Encounter (INDEPENDENT_AMBULATORY_CARE_PROVIDER_SITE_OTHER): Admitting: Ophthalmology

## 2025-02-03 ENCOUNTER — Encounter (INDEPENDENT_AMBULATORY_CARE_PROVIDER_SITE_OTHER): Admitting: Ophthalmology

## 2025-06-21 ENCOUNTER — Encounter: Admitting: Family Medicine

## 2025-12-05 ENCOUNTER — Ambulatory Visit: Admitting: Family Medicine
